# Patient Record
Sex: Female | Born: 1935
Health system: Southern US, Community
[De-identification: ages and names within clinical notes are randomized; demographics above are authoritative.]

## PROBLEM LIST (undated history)

## (undated) DIAGNOSIS — M1990 Unspecified osteoarthritis, unspecified site: Secondary | ICD-10-CM

## (undated) DIAGNOSIS — I10 Essential (primary) hypertension: Secondary | ICD-10-CM

## (undated) DIAGNOSIS — H269 Unspecified cataract: Secondary | ICD-10-CM

## (undated) DIAGNOSIS — R768 Other specified abnormal immunological findings in serum: Secondary | ICD-10-CM

## (undated) DIAGNOSIS — Z8719 Personal history of other diseases of the digestive system: Secondary | ICD-10-CM

## (undated) DIAGNOSIS — E119 Type 2 diabetes mellitus without complications: Secondary | ICD-10-CM

## (undated) DIAGNOSIS — Z9889 Other specified postprocedural states: Secondary | ICD-10-CM

## (undated) DIAGNOSIS — N200 Calculus of kidney: Secondary | ICD-10-CM

## (undated) DIAGNOSIS — F419 Anxiety disorder, unspecified: Secondary | ICD-10-CM

## (undated) DIAGNOSIS — K219 Gastro-esophageal reflux disease without esophagitis: Secondary | ICD-10-CM

## (undated) DIAGNOSIS — E7800 Pure hypercholesterolemia, unspecified: Secondary | ICD-10-CM

## (undated) DIAGNOSIS — E039 Hypothyroidism, unspecified: Secondary | ICD-10-CM

## (undated) DIAGNOSIS — R7689 Other specified abnormal immunological findings in serum: Secondary | ICD-10-CM

## (undated) DIAGNOSIS — R42 Dizziness and giddiness: Secondary | ICD-10-CM

## (undated) DIAGNOSIS — K760 Fatty (change of) liver, not elsewhere classified: Secondary | ICD-10-CM

## (undated) DIAGNOSIS — E669 Obesity, unspecified: Secondary | ICD-10-CM

## (undated) DIAGNOSIS — I499 Cardiac arrhythmia, unspecified: Secondary | ICD-10-CM

## (undated) DIAGNOSIS — R112 Nausea with vomiting, unspecified: Secondary | ICD-10-CM

## (undated) DIAGNOSIS — I639 Cerebral infarction, unspecified: Secondary | ICD-10-CM

## (undated) DIAGNOSIS — B1910 Unspecified viral hepatitis B without hepatic coma: Secondary | ICD-10-CM

## (undated) DIAGNOSIS — Z8639 Personal history of other endocrine, nutritional and metabolic disease: Secondary | ICD-10-CM

## (undated) HISTORY — PX: OTHER SURGICAL HISTORY: SHX169

## (undated) HISTORY — PX: BILATERAL CARPAL TUNNEL RELEASE: SHX6508

## (undated) HISTORY — DX: Pure hypercholesterolemia, unspecified: E78.00

## (undated) HISTORY — DX: Calculus of kidney: N20.0

## (undated) HISTORY — DX: Obesity, unspecified: E66.9

## (undated) HISTORY — DX: Dizziness and giddiness: R42

## (undated) HISTORY — DX: Gastro-esophageal reflux disease without esophagitis: K21.9

## (undated) HISTORY — DX: Anxiety disorder, unspecified: F41.9

## (undated) HISTORY — DX: Hypercalcemia: E83.52

## (undated) HISTORY — PX: ABDOMINAL HYSTERECTOMY: SHX81

## (undated) HISTORY — DX: Other specified abnormal immunological findings in serum: R76.89

## (undated) HISTORY — DX: Other specified abnormal immunological findings in serum: R76.8

## (undated) HISTORY — DX: Type 2 diabetes mellitus without complications: E11.9

## (undated) HISTORY — DX: Unspecified cataract: H26.9

## (undated) HISTORY — DX: Cerebral infarction, unspecified: I63.9

## (undated) HISTORY — DX: Essential (primary) hypertension: I10

## (undated) HISTORY — DX: Hypothyroidism, unspecified: E03.9

## (undated) HISTORY — PX: KNEE ARTHROSCOPY: SUR90

## (undated) HISTORY — DX: Fatty (change of) liver, not elsewhere classified: K76.0

## (undated) HISTORY — PX: EYE SURGERY: SHX253

## (undated) HISTORY — DX: Unspecified viral hepatitis B without hepatic coma: B19.10

## (undated) HISTORY — PX: CATARACT EXTRACTION, BILATERAL: SHX1313

---
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
## 1979-05-25 HISTORY — PX: APPENDECTOMY: SHX54

## 1979-05-25 HISTORY — PX: ABDOMINAL HYSTERECTOMY: SHX81

## 1998-11-20 ENCOUNTER — Encounter (INDEPENDENT_AMBULATORY_CARE_PROVIDER_SITE_OTHER): Payer: Self-pay | Admitting: Specialist

## 1998-11-20 ENCOUNTER — Other Ambulatory Visit: Admission: RE | Admit: 1998-11-20 | Discharge: 1998-11-20 | Payer: Self-pay | Admitting: Internal Medicine

## 2000-01-18 ENCOUNTER — Encounter: Admission: RE | Admit: 2000-01-18 | Discharge: 2000-02-04 | Payer: Self-pay | Admitting: Orthopaedic Surgery

## 2002-04-04 ENCOUNTER — Encounter: Admission: RE | Admit: 2002-04-04 | Discharge: 2002-06-06 | Payer: Self-pay | Admitting: Orthopaedic Surgery

## 2002-11-12 ENCOUNTER — Other Ambulatory Visit: Admission: RE | Admit: 2002-11-12 | Discharge: 2002-11-12 | Payer: Self-pay | Admitting: Family Medicine

## 2003-08-07 ENCOUNTER — Ambulatory Visit (HOSPITAL_COMMUNITY): Admission: RE | Admit: 2003-08-07 | Discharge: 2003-08-07 | Payer: Self-pay | Admitting: Internal Medicine

## 2005-01-22 ENCOUNTER — Ambulatory Visit (HOSPITAL_COMMUNITY): Admission: RE | Admit: 2005-01-22 | Discharge: 2005-01-22 | Payer: Self-pay | Admitting: Family Medicine

## 2006-04-27 ENCOUNTER — Ambulatory Visit: Payer: Self-pay | Admitting: Internal Medicine

## 2006-11-15 ENCOUNTER — Ambulatory Visit (HOSPITAL_COMMUNITY): Admission: RE | Admit: 2006-11-15 | Discharge: 2006-11-15 | Payer: Self-pay | Admitting: Family Medicine

## 2007-01-18 ENCOUNTER — Ambulatory Visit (HOSPITAL_COMMUNITY): Admission: RE | Admit: 2007-01-18 | Discharge: 2007-01-18 | Payer: Self-pay | Admitting: Internal Medicine

## 2007-03-01 ENCOUNTER — Encounter (HOSPITAL_COMMUNITY): Admission: RE | Admit: 2007-03-01 | Discharge: 2007-03-31 | Payer: Self-pay | Admitting: Oncology

## 2007-03-01 ENCOUNTER — Ambulatory Visit (HOSPITAL_COMMUNITY): Payer: Self-pay | Admitting: Oncology

## 2007-03-09 ENCOUNTER — Ambulatory Visit (HOSPITAL_COMMUNITY): Admission: RE | Admit: 2007-03-09 | Discharge: 2007-03-09 | Payer: Self-pay | Admitting: Oncology

## 2007-11-13 ENCOUNTER — Encounter (HOSPITAL_COMMUNITY): Admission: RE | Admit: 2007-11-13 | Discharge: 2007-12-13 | Payer: Self-pay | Admitting: Oncology

## 2007-11-15 ENCOUNTER — Ambulatory Visit (HOSPITAL_COMMUNITY): Payer: Self-pay | Admitting: Oncology

## 2008-05-07 ENCOUNTER — Ambulatory Visit (HOSPITAL_COMMUNITY): Payer: Self-pay | Admitting: Oncology

## 2008-10-22 ENCOUNTER — Encounter (HOSPITAL_COMMUNITY): Admission: RE | Admit: 2008-10-22 | Discharge: 2008-11-21 | Payer: Self-pay | Admitting: Oncology

## 2008-10-23 ENCOUNTER — Ambulatory Visit (HOSPITAL_COMMUNITY): Payer: Self-pay | Admitting: Oncology

## 2010-08-19 ENCOUNTER — Other Ambulatory Visit: Payer: Self-pay | Admitting: Dermatology

## 2010-10-05 ENCOUNTER — Ambulatory Visit (INDEPENDENT_AMBULATORY_CARE_PROVIDER_SITE_OTHER): Payer: MEDICARE | Admitting: Internal Medicine

## 2010-10-05 DIAGNOSIS — B181 Chronic viral hepatitis B without delta-agent: Secondary | ICD-10-CM

## 2010-10-09 NOTE — Op Note (Signed)
NAME:  Lisa Sutton, Lisa Sutton                       ACCOUNT NO.:  [id number]   MEDICAL RECORD NO.:  [id number]                   PATIENT TYPE:  AMB   LOCATION:  DAY                                  FACILITY:  [facility]   PHYSICIAN:  [name], M.D.                 DATE OF BIRTH:  Jan 29, 1934   DATE OF PROCEDURE:  08/07/2003  DATE OF DISCHARGE:                                 OPERATIVE REPORT   PROCEDURE:  Esophagogastroduodenoscopy with esophageal dilatation followed  by total colonoscopy.   ENDOSCOPIST:  [name], M.D.   INDICATIONS:  Lisa Sutton is a 77 year old Caucasian female with chronic  GERD who is now experiencing dysphagia, primarily to solids.  She had last  ED 15 to 20 years ago.  She is also undergoing surveillance colonoscopy.  Her last colonoscopy was about 5 years ago by Dr. Olevia Perches in [place].  She  had 2 polyps removed.   Procedure and risks were reviewed with the patient and informed consent was  obtained.   PREOPERATIVE MEDICATIONS:  Cetacaine spray for oropharyngeal topical  anesthesia, Demerol 50 mg IV and Versed 8 mg IV in divided dose.   FINDINGS:  Procedure performed in endoscopy suite.  The patient's vital  signs and O2 saturation were monitored during the procedure and remained  stable.   PROCEDURE #1: ESOPHAGOGASTRODUODENOSCOPY:  The patient was placed in the  left lateral recumbent position and Olympus videoscope was passed via the  oropharynx without any difficulty into the esophagus.   ESOPHAGUS:  Mucosa of the esophagus normal except distally where there was a  pseudodiverticular formation just above the GE junction secondary to  scarring.  There was a Schatzki ring.  There was a 5-6 mm ectopic gastric  mucosa at the proximal GE junction.  Pictures taken for the record.  There  was a 3-4 cm size sliding hiatal hernia.   STOMACH:  It was empty and distended very well with insufflation.  The folds  in the proximal stomach were normal.   Examination of the mucosa reveals  patchy erythema and granularity at body and antrum.  No erosions or ulcers  noted.  Pyloric channel was patent.  Angularis, fundus, and cardia were  examined by retroflexing the scope and were normal.   DUODENUM:  Examination of the bulb and postbulbar mucosa was normal.  Folds  in the second part of the duodenum were also normal.  Endoscope was  withdrawn.   The esophagus was dilated by passing 56 [place] Maloney dilator through the  esophagus completely. As the dilator was withdrawn the endoscope was passed  again and there was a small linear tear of the cervical esophagus indicating  either a tubular narrowing or a web.  There was also a disruption of the  Schatzki ring.  Pictures taken for the record and endoscope was withdrawn.  The patient prepared for procedure #2.   COLONOSCOPY:  Rectal examination was  performed.  No abnormality noted on  external or digital exam.   Olympus videoscope was placed in the rectum and advanced into the sigmoid  colon.  Multiple diverticula noted at sigmoid colon with a few more at  descending colon.  The sigmoid colon was tortuous and relatively fixed, but  slowly and carefully the scope was passed through this segment and into  descending colon, and further intubation to cecum was easy.  The cecum was  identified by appendiceal orifice and the ileocecal valve.  Pictures taken  for the record.  As the scope was withdrawn, colonic mucosa was carefully  examined and there was a single small polyp at hepatic flexure which was  ablated by cold biopsy.  There was focal, mucosal erythema at sigmoid colon,  but this was not peridiverticular.  This was felt to be nonspecific.  Pictures taken, but no biopsies were obtained.  Rectal mucosa was normal.   The scope was retroflexed to examine anorectal junction and moderate sized  hemorrhoids were noted below the dentate line. The endoscope was  straightened and withdrawn.   The patient tolerated the procedure well.   FINAL DIAGNOSES:  1. Distal esophageal ring with 4 cm size sliding hiatal hernia.  2. Pseudodiverticular formation at distal esophagus secondary to scarring     from previous esophagitis.  3. Esophagus with 56 French Maloney dilator disrupting this ring and also     across linear tear at cervical esophagus indicating a web or a focal     tubular narrowing.  4. Nonerosive gastritis.  5. Multiple diverticula at sigmoid colon with a few more at descending     colon.  6. Small polyp ablated by a cold biopsy from hepatic flexure.  7. Moderate sized external hemorrhoids.   RECOMMENDATIONS:  1. H. pylori serology will be obtained.  2. She will continue antireflux measures and should take Protonix 40 mg     q.a.m.  3. High fiber diet along with Citrucel 1 tablespoonful daily.  4. I will be contacting the patient with results of blood tests and biopsy.      ___________________________________________                                            [name], M.D.   NR/MEDQ  D:  08/07/2003  T:  08/07/2003  Job:  [id number]   cc:   [name], M.D.  [place]  [place] [address]  Fax: [phone]

## 2010-10-09 NOTE — Consult Note (Signed)
NAME:  Lisa Sutton, Lisa Sutton                         ACCOUNT NO.:  [id number]   MEDICAL RECORD NO.:  [id number]                  PATIENT TYPE:   LOCATION:                                       FACILITY:   PHYSICIAN:  [name], M.D.                 DATE OF BIRTH:  07-25-1933   DATE OF CONSULTATION:  07/18/2003  DATE OF DISCHARGE:                                   CONSULTATION   REASON FOR CONSULTATION:  Dysphagia.  The patient has chronic GERD.  History  of colonic polyps.  Time for a colonoscopy.   HISTORY OF PRESENT ILLNESS:  Lisa Sutton is a 77 year old Caucasian female  who is referred through the courtesy of Dr. Morrie Sheldon for GI evaluation.  She states she has had symptoms of reflux for over 20 years and she had an  ED 15-20 years ago by Dr. Lyla Son and was found to have a small sliding  hiatal hernia.  She has been on therapy and has done reasonably well.  Lately she has had some difficulty swallowing, particularly with raw apples.  About 8 or 10 weeks ago she had an episode where food would not get down,  she had regurgitation, and she finally was able to get relief.  She rarely  experiences heartburn.  She denies hoarseness, cough, or sore throat.  She  also denies nausea or vomiting, abdominal pain, melena, or rectal bleeding.  She is also interested in having a colonoscopy.  She was found to have a  couple of polyps on colonoscopy 5 years ago and also had one or two on a  colposcopy 10 years ago.  Her last colonoscopy was by Dr. Olevia Perches.  The  patient was told that her colon was very tortuous.  She has never had an  problems with constipation or diarrhea.  She has a very good appetite.  She  has gained 25 pounds in the last 5 years but lately her weight has been  stable.  She has had some difficulty walking since her left knee arthroscopy  but she does play golf regularly and gets some exercise walking that way.  She complains of cramps in her legs.  She was on Pravachol  which she  stopped.  She had had similar problem with other agents but she has never  been on Zetia.   She is on:  1. Levoxyl 112 mcg daily.  2. Lisinopril 10 mg daily.  3. Atenolol 25 mg daily.  4. Aspirin 81 mg daily.  5. Multivitamin daily.  6. Citracal daily.  7. Protonix 40 mg daily p.r.n.  8. Premarin 0.45 mg daily but she is taking it less often and her goal is to     just get off the medication.  9. Pravachol has been on hold.   PAST MEDICAL HISTORY:  1. History of GERD for 25 years.  2. Hypothyroidism was diagnosed 20 years ago.  3. She has been hypertensive for 4.  4. Hypercholesterolemia in which she is intolerant of HMG-CoA reductases.  5. She had hysterectomy 25 years ago.  6. She had left knee arthroscopy in November 2003.  7. She had a stress test in September 2004 which was within normal limits.   ALLERGIES:  None known.   FAMILY HISTORY:  Mother died in her sleep at age 29.  Physicians felt that  she either had MI or a pulmonary embolism.  Father died of complications  related to hip fracture at age 17.  She does not have any siblings.   SOCIAL HISTORY:  She is married.  She has one son.  She worked at [employer] for  almost 30 years but she has been retired for 5 or 6 years.  She smoked  cigarettes for 10 years, no more than half a pack per day but quit 10 years  ago.  She does not drink alcohol.   PHYSICAL EXAMINATION:  GENERAL:  Pleasant, mildly-obese Caucasian female who  is in no acute distress.  VITAL SIGNS:  She weighs 177.5 pounds, she is 5 feet 3 inches tall.  Pulse  62 per minute, blood pressure 140/90, temperature is 96.6.  HEENT:  Conjunctivae are pink, sclerae are nonicteric.  Oropharyngeal mucosa  is normal.  NECK:  Without masses or thyromegaly.  Carotids are 2+ bilaterally without  bruits.  CARDIAC:  Regular rhythm, normal S1 and S2.  No murmur or gallop noted.  LUNGS:  Clear to auscultation.  ABDOMEN:  Full.  Bowel sounds are normal.   Palpation reveals soft abdomen  without tenderness, organomegaly, or masses.  RECTAL:  Deferred.  EXTREMITIES:  No clubbing or peripheral edema noted.   Some of her records are available for review.  She had colonoscopy in June  2000.  She had small polyps snared from her cecum.  She also had left  colonic diverticulosis.   ASSESSMENT:  Lisa Sutton is a 77 year old Caucasian female with chronic  gastroesophageal reflux disease who is having intermittent dysphagia.  She  may have had a single episode of food impaction recently, relieved  spontaneously.  She has had some difficulty with raw apples but not with  many other foods.  She possibly has distal esophageal ring which should be  amenable to endoscopic therapy.   History of colonic polyps.  She has had polyps found on more than one  colonoscopy.  She is almost getting to a 5-year mark and therefore would be  appropriate for her to have a colonoscopy now with her EGD so she would not  have to make two trips.   RECOMMENDATIONS:  1. She will continue antireflux measures and Protonix as before.  2. EGD, possible ED, and a colonoscopy to be performed at [facility] in the near     future.   She was asked to hold off aspirin for 2 days.  I have reviewed the  procedures and risks with the patient.  She is agreeable.  Will use the  pediatric scope given that she has very tortuous colon.   As far as her hypercholesterolemia is concerned, she will follow up with Dr.  Morrie Sheldon.  She might be a candidate for Zetia since she has not been able  to tolerate HMG-CoA reductases.   I would like to thank Dr. Laurance Flatten for the opportunity to participate in the  care of this nice lady.      ___________________________________________  [name], M.D.   NR/MEDQ  D:  07/18/2003  T:  07/18/2003  Job:  [id number]   cc:   [name], M.D. [place]  [place] [address]  Fax: [facility]

## 2010-10-12 ENCOUNTER — Other Ambulatory Visit (HOSPITAL_COMMUNITY): Payer: Self-pay | Admitting: Oncology

## 2010-10-12 DIAGNOSIS — Z09 Encounter for follow-up examination after completed treatment for conditions other than malignant neoplasm: Secondary | ICD-10-CM

## 2010-12-09 ENCOUNTER — Encounter (HOSPITAL_COMMUNITY): Payer: Medicare Other | Attending: Oncology

## 2010-12-09 ENCOUNTER — Ambulatory Visit (HOSPITAL_COMMUNITY)
Admission: RE | Admit: 2010-12-09 | Discharge: 2010-12-09 | Disposition: A | Discharge status: Home / Self Care | Payer: Medicare Other | Source: Ambulatory Visit | Attending: Oncology | Admitting: Oncology

## 2010-12-09 DIAGNOSIS — Z09 Encounter for follow-up examination after completed treatment for conditions other than malignant neoplasm: Secondary | ICD-10-CM

## 2010-12-09 DIAGNOSIS — N200 Calculus of kidney: Secondary | ICD-10-CM | POA: Insufficient documentation

## 2010-12-09 DIAGNOSIS — Q619 Cystic kidney disease, unspecified: Secondary | ICD-10-CM | POA: Insufficient documentation

## 2010-12-09 DIAGNOSIS — R599 Enlarged lymph nodes, unspecified: Secondary | ICD-10-CM | POA: Insufficient documentation

## 2010-12-09 DIAGNOSIS — N281 Cyst of kidney, acquired: Secondary | ICD-10-CM | POA: Insufficient documentation

## 2010-12-09 DIAGNOSIS — K5730 Diverticulosis of large intestine without perforation or abscess without bleeding: Secondary | ICD-10-CM | POA: Insufficient documentation

## 2010-12-09 DIAGNOSIS — R591 Generalized enlarged lymph nodes: Secondary | ICD-10-CM

## 2010-12-09 LAB — COMPREHENSIVE METABOLIC PANEL
Albumin: 3.7 g/dL (ref 3.5–5.2)
BUN: 17 mg/dL (ref 6–23)
CO2: 30 mEq/L (ref 19–32)
Calcium: 10.2 mg/dL (ref 8.4–10.5)
GFR calc Af Amer: >60 mL/min (ref >60)
GFR calc non Af Amer: >60 mL/min (ref >60)
Glucose, Bld: 112 mg/dL — ABNORMAL HIGH (ref 70–99)
Potassium: 4.8 mEq/L (ref 3.5–5.1)
Total Bilirubin: 0.3 mg/dL (ref 0.3–1.2)

## 2010-12-09 LAB — CBC
Hemoglobin: 15.6 g/dL — ABNORMAL HIGH (ref 12.0–15.0)
MCH: 31 pg (ref 26.0–34.0)
MCHC: 33.5 g/dL (ref 30.0–36.0)
MCV: 92.5 fL (ref 78.0–100.0)
Platelets: 276 10*3/uL (ref 150–400)

## 2010-12-09 LAB — LACTATE DEHYDROGENASE: LDH: 153 U/L (ref 94–250)

## 2010-12-09 NOTE — Progress Notes (Signed)
Labs drawn today cbc , ldh,cmp

## 2010-12-14 ENCOUNTER — Encounter (HOSPITAL_COMMUNITY): Payer: Self-pay | Admitting: Oncology

## 2010-12-14 ENCOUNTER — Encounter (HOSPITAL_BASED_OUTPATIENT_CLINIC_OR_DEPARTMENT_OTHER): Payer: Medicare Other | Admitting: Oncology

## 2010-12-14 VITALS — BP 163/92 | HR 71 | Temp 97.9°F | Wt 180.0 lb

## 2010-12-14 DIAGNOSIS — R599 Enlarged lymph nodes, unspecified: Secondary | ICD-10-CM

## 2010-12-14 DIAGNOSIS — R591 Generalized enlarged lymph nodes: Secondary | ICD-10-CM

## 2010-12-14 NOTE — Progress Notes (Signed)
CC:   [name], M.D. [name], M.D.  DIAGNOSIS:  Mild lymphadenopathy in the retroperitoneum.  They are stable in size compared to June 2009, up slightly compared to June 2010 but by no means worse realistically.  She still has no "B" symptomatology and her labs the other day were excellent.  Lisa Sutton has no symptoms, still playing golf, fully active.  Her labs the other day showed a normal hemoglobin, normal hematocrit essentially, normal white count, platelet count and liver enzymes. Glucose was only 112.  Unfortunately her weight is up to 180 pounds, but she hovers around this area anyway.  PHYSICAL EXAMINATION:  No lymphadenopathy.  She is in no acute distress. She has no hepatosplenomegaly.  Breast exam is negative for masses. Lungs are clear.  Heart shows a regular rhythm and rate without murmur, rub or gallop.  She has no arm or leg edema.  She is alert and very oriented.  Her CT scan realistically is stable and she has been observed essentially since 2008 June.  We will see her once more time in 2 years. If she stable,  I do not see any need to really see her back for those lymph nodes, unless it shifts again, again a 2-year follow-up but will cross that bridge when we come to it.  She knows to come back sooner if she has any fevers, chills, night sweats, unexplained weight loss or just does not feel good.    ______________________________ Gaston Islam. Tressie Stalker, MD ESN/MEDQ  D:  12/14/2010  T:  12/14/2010  Job:  [id number]

## 2010-12-14 NOTE — Progress Notes (Signed)
This office note has been dictated.

## 2010-12-14 NOTE — Patient Instructions (Signed)
[facility]  Discharge Instructions  RECOMMENDATIONS MADE BY THE CONSULTANT AND ANY TEST RESULTS WILL BE SENT TO YOUR REFERRING DOCTOR.   EXAM FINDINGS BY MD TODAY AND SIGNS AND SYMPTOMS TO REPORT TO CLINIC OR PRIMARY MD: night sweats, fever, unexplained weight loss       SPECIAL INSTRUCTIONS/FOLLOW-UP: Return to Clinic on 2 years   I acknowledge that I have been informed and understand all the instructions given to me and received a copy. I do not have any more questions at this time, but understand that I may call the [facility] at [facility] at [phone] during business hours should I have any further questions or need assistance in obtaining follow-up care.    __________________________________________  _____________  __________ Signature of Patient or Authorized Representative            Date                   Time    __________________________________________ Nurse's Signature

## 2011-03-04 LAB — DIFFERENTIAL
Basophils Absolute: 0.1
Monocytes Absolute: 1.2 — ABNORMAL HIGH

## 2011-03-04 LAB — CBC
HCT: 42.9
Hemoglobin: 14.6
MCHC: 33.9
Platelets: 401 — ABNORMAL HIGH
WBC: 10.5

## 2011-03-04 LAB — SEDIMENTATION RATE: Sed Rate: 15

## 2011-03-04 LAB — BETA 2 MICROGLOBULIN, SERUM: Beta-2 Microglobulin: 2.72 — ABNORMAL HIGH

## 2011-04-21 ENCOUNTER — Telehealth (INDEPENDENT_AMBULATORY_CARE_PROVIDER_SITE_OTHER): Payer: Self-pay | Admitting: *Deleted

## 2011-04-21 NOTE — Telephone Encounter (Signed)
LM, wanting Tammy to return his call to [phone]. He is needing a new rx for something.

## 2011-04-28 NOTE — Telephone Encounter (Signed)
I called Lisa Sutton and left a message for her to call me with the name of prescription,ect that she may need and I would address with Dr. Laural Golden on 05-03-11.. I also ask that if this was a local pharmacy to have them fax the request to [place] and supplied the fax number.

## 2011-08-09 ENCOUNTER — Encounter (INDEPENDENT_AMBULATORY_CARE_PROVIDER_SITE_OTHER): Payer: Self-pay | Admitting: Internal Medicine

## 2011-08-09 ENCOUNTER — Ambulatory Visit (INDEPENDENT_AMBULATORY_CARE_PROVIDER_SITE_OTHER): Payer: Medicare Other | Admitting: Internal Medicine

## 2011-08-09 ENCOUNTER — Other Ambulatory Visit (INDEPENDENT_AMBULATORY_CARE_PROVIDER_SITE_OTHER): Payer: Self-pay | Admitting: *Deleted

## 2011-08-09 DIAGNOSIS — B1910 Unspecified viral hepatitis B without hepatic coma: Secondary | ICD-10-CM | POA: Insufficient documentation

## 2011-08-09 DIAGNOSIS — R1310 Dysphagia, unspecified: Secondary | ICD-10-CM

## 2011-08-09 DIAGNOSIS — E1121 Type 2 diabetes mellitus with diabetic nephropathy: Secondary | ICD-10-CM | POA: Insufficient documentation

## 2011-08-09 DIAGNOSIS — R1319 Other dysphagia: Secondary | ICD-10-CM | POA: Insufficient documentation

## 2011-08-09 DIAGNOSIS — K760 Fatty (change of) liver, not elsewhere classified: Secondary | ICD-10-CM | POA: Insufficient documentation

## 2011-08-09 MED ORDER — OMEPRAZOLE 20 MG PO CPDR: 20.0000 mg | DELAYED_RELEASE_CAPSULE | Freq: Two times a day (BID) | ORAL | Status: DC

## 2011-08-09 NOTE — Patient Instructions (Signed)
Will schedule and EGD/ED with Dr. Laural Golden.

## 2011-08-09 NOTE — Progress Notes (Signed)
Subjective:     Patient ID: Lisa Sutton, female   DOB: 05/17/1934, 78 y.o.   MRN: [id number]  HPI  Eight weeks ago she was put on Metformin. Since starting the Metformin  She started having dysphagia to solids. She will have to vomit the bolus it up.  It is occuring about 2-3 weeks.  She also c/o of acid reflux.  Spicy foods will cause acid reflux. Appetite is good. No weight loss. No abdominal pain. BM x 1 a day.  Her last EGD/ED in 02/2009 for dysphagia: Erosive reflux esophagitis with ring at the GE junction which was dilated/disrupted by passing a [place] dilator. Erosive antral gastritis with 3 mm ulcer. Hx of fatty liver and chronic Hepatitis B infection with undetectable HBV DNA levels.  12/09/2010 AST 28, ALT 31, ALP 97.  I will try to obtain her most recent CMET from Dr. Laurance Flatten Review of Systems see hpi     Current Outpatient Prescriptions  Medication Sig Dispense Refill  . aspirin 81 MG EC tablet Take 81 mg by mouth daily.        Marland Kitchen atenolol (TENORMIN) 25 MG tablet Take 25 mg by mouth daily.        . calcium citrate-vitamin D 200-200 MG-UNIT TABS Take 1 tablet by mouth daily.        . Cholecalciferol (VITAMIN D) 1000 UNITS capsule Take 1,000 Units by mouth daily.        . fish oil-omega-3 fatty acids 1000 MG capsule Take 1,000 mg by mouth daily.        Marland Kitchen levothyroxine (SYNTHROID, LEVOTHROID) 112 MCG tablet Take 112 mcg by mouth daily. 169mcg daily and 79mcg on M-W-F      . lisinopril (PRINIVIL,ZESTRIL) 20 MG tablet Take 20 mg by mouth daily.        . metFORMIN (GLUCOPHAGE) 500 MG tablet Take 500 mg by mouth 2 (two) times daily with a meal.      . Multiple Vitamin (MULTIVITAMIN) tablet Take 1 tablet by mouth daily.        Marland Kitchen omeprazole (PRILOSEC) 20 MG capsule Take 1 capsule (20 mg total) by mouth 2 (two) times daily.  60 capsule  1   Past Medical History  Diagnosis Date  . Diabetes mellitus   . Hypertension   . GERD (gastroesophageal reflux disease)   .  Osteoporosis   . Obesity   . Hypothyroidism   . Dysphagia   . Hepatitis B   . Fatty liver    Past Surgical History  Procedure Date  . Abdominal hysterectomy   . Deviated septum repair   . Knee arthroscopy     left knee 2003   No family status information on file.   . Objective:   Physical Exam Filed Vitals:   08/09/11 1144  Height: 5\' 3"  (1.6 m)  Weight: 179 lb 9.6 oz (81.466 kg)    Alert and oriented. Skin warm and dry. Oral mucosa is moist.   . Sclera anicteric, conjunctivae is pink. Thyroid not enlarged. No cervical lymphadenopathy. Lungs clear. Heart regular rate and rhythm.  Abdomen is soft. Bowel sounds are positive. No hepatomegaly. No abdominal masses felt. No tenderness.  No edema to lower extremities. Patient is alert and oriented.      Assessment:    Dysphagia to solids. Esophageal stricture/ring needs to be ruled out. PUD also needs to be ruled given pat hx.     Plan:    EGD/ED in the near future  with Dr. Selena Lesser 20mg  po daily. Further recommendations to follow once she has has the EGD/ED

## 2011-08-09 NOTE — Progress Notes (Signed)
Addended by: Butch Penny on: 08/09/2011 03:17 PM   Modules accepted: Orders

## 2011-08-10 ENCOUNTER — Encounter (HOSPITAL_COMMUNITY): Payer: Self-pay | Admitting: Pharmacy Technician

## 2011-08-17 MED ORDER — SODIUM CHLORIDE 0.45 % IV SOLN
Freq: Once | INTRAVENOUS | Status: AC
  Administered 2011-08-18: 1000 mL via INTRAVENOUS

## 2011-08-18 ENCOUNTER — Encounter (HOSPITAL_COMMUNITY): Payer: Self-pay | Admitting: *Deleted

## 2011-08-18 ENCOUNTER — Ambulatory Visit (HOSPITAL_COMMUNITY)
Admission: RE | Admit: 2011-08-18 | Discharge: 2011-08-18 | Disposition: A | Discharge status: Home / Self Care | Payer: Medicare Other | Source: Ambulatory Visit | Attending: Internal Medicine | Admitting: Internal Medicine

## 2011-08-18 ENCOUNTER — Encounter (HOSPITAL_COMMUNITY)
Admission: RE | Disposition: A | Discharge status: Home / Self Care | Payer: Self-pay | Source: Ambulatory Visit | Attending: Internal Medicine

## 2011-08-18 DIAGNOSIS — R1310 Dysphagia, unspecified: Secondary | ICD-10-CM | POA: Insufficient documentation

## 2011-08-18 DIAGNOSIS — K222 Esophageal obstruction: Secondary | ICD-10-CM

## 2011-08-18 DIAGNOSIS — K449 Diaphragmatic hernia without obstruction or gangrene: Secondary | ICD-10-CM | POA: Insufficient documentation

## 2011-08-18 DIAGNOSIS — K219 Gastro-esophageal reflux disease without esophagitis: Secondary | ICD-10-CM

## 2011-08-18 DIAGNOSIS — Z79899 Other long term (current) drug therapy: Secondary | ICD-10-CM | POA: Insufficient documentation

## 2011-08-18 DIAGNOSIS — E119 Type 2 diabetes mellitus without complications: Secondary | ICD-10-CM | POA: Insufficient documentation

## 2011-08-18 DIAGNOSIS — I10 Essential (primary) hypertension: Secondary | ICD-10-CM | POA: Insufficient documentation

## 2011-08-18 DIAGNOSIS — Z7982 Long term (current) use of aspirin: Secondary | ICD-10-CM | POA: Insufficient documentation

## 2011-08-18 SURGERY — ESOPHAGOGASTRODUODENOSCOPY (EGD) WITH ESOPHAGEAL DILATION
Anesthesia: Moderate Sedation

## 2011-08-18 MED ORDER — MIDAZOLAM HCL 5 MG/5ML IJ SOLN
INTRAMUSCULAR | Status: DC | PRN
  Administered 2011-08-18: 1 mg via INTRAVENOUS
  Administered 2011-08-18 (×2): 2 mg via INTRAVENOUS
  Administered 2011-08-18: 1 mg via INTRAVENOUS

## 2011-08-18 MED ORDER — MEPERIDINE HCL 25 MG/ML IJ SOLN
INTRAMUSCULAR | Status: DC | PRN
  Administered 2011-08-18 (×2): 25 mg via INTRAVENOUS

## 2011-08-18 MED ORDER — MIDAZOLAM HCL 5 MG/5ML IJ SOLN
INTRAMUSCULAR | Status: AC
  Filled 2011-08-18: qty 10

## 2011-08-18 MED ORDER — MEPERIDINE HCL 50 MG/ML IJ SOLN
INTRAMUSCULAR | Status: AC
  Filled 2011-08-18: qty 1

## 2011-08-18 MED ORDER — BUTAMBEN-TETRACAINE-BENZOCAINE 2-2-14 % EX AERO
INHALATION_SPRAY | CUTANEOUS | Status: DC | PRN
  Administered 2011-08-18: 1 via TOPICAL

## 2011-08-18 NOTE — Op Note (Addendum)
EGD PROCEDURE REPORT  PATIENT:  Lisa Sutton  MR#:  [id number] Birthdate:  April 02, 1934, 78 y.o., female Endoscopist:  Dr. Rogene Houston, MD Referred By:  Dr. [name], MD Procedure Date: 08/18/2011  Procedure:   EGD with ED.  Indications:  Patient is a 78 year old Caucasian female who presents with solid food dysphagia. She has chronic GERD and her symptoms of uncontrolled therapy. She has undergone esophageal dilation in 2005 and more recently in 2010.            Informed Consent:  The risks, benefits, alternatives & imponderables which include, but are not limited to, bleeding, infection, perforation, drug reaction and potential missed lesion have been reviewed.  The potential for biopsy, lesion removal, esophageal dilation, etc. have also been discussed.  Questions have been answered.  All parties agreeable.  Please see history & physical in medical record for more information.  Medications:  Demerol 50 mg IV Versed 6 mg IV Cetacaine spray topically for oropharyngeal anesthesia  Description of procedure:  The endoscope was introduced through the mouth and advanced to the second portion of the duodenum without difficulty or limitations. The mucosal surfaces were surveyed very carefully during advancement of the scope and upon withdrawal.  Findings:  Esophagus:  Mucosa of the proximal and middle third was normal. Some scarring in the distal esophagus and a soft stricture at GE junction. GEJ:  33 cm Hiatus:  35 cm Stomach:  Stomach was empty and distended very well with insufflation. Fold in the proximal stomach are normal. Examination mucosa and body, antrum pyloric channel as well as angularis fundus and cardia was normal. Duodenum:  Normal bulbar and post bulbar mucosa.  Therapeutic/Diagnostic Maneuvers Performed:  Esophagus dilated by passing 11 French Maloney dilator from insertion. Esophageal mucosa was reexamined post dilation there was linear mucosal disruption just  below the upper esophageal sphincter as well as at GE junction. Patient tolerated the procedure well.  Complications:  None  Impression: Soft stricture at GE junction. Small sliding hiatal hernia. Esophageal dilation performed by passing 56 French Maloney dilator disrupting the mucosa at GE junction as well as below UES indicative of esophageal web.  Recommendations:  Standard instructions given. Patient to call us with progress report in one week  Fleta Borgeson U  08/18/2011  12:58 PM  CC: Dr. Redge Gainer, MD, MD & Dr. Rayne Du ref. provider found

## 2011-08-18 NOTE — Discharge Instructions (Signed)
Resume aspirin on 08/19/2011. Soft diet for 24 hours. No driving for [id number]. Please call the office with progress report in one week.Moderate Sedation, Adult Moderate sedation is given to help you relax or even sleep through a procedure. You may remain sleepy, be clumsy, or have poor balance for several hours following this procedure. Arrange for a responsible adult, family member, or friend to take you home. A responsible adult should stay with you for at least 24 hours or until the medicines have worn off.  Do not participate in any activities where you could become injured for the next 24 hours, or until you feel normal again. Do not:   Drive.   Swim.   Ride a bicycle.   Operate heavy machinery.   Cook.   Use power tools.   Climb ladders.   Work at [employer].   Do not make important decisions or sign legal documents until you are improved.   Vomiting may occur if you eat too soon. When you can drink without vomiting, try water, juice, or soup. Try solid foods if you feel little or no nausea.   Only take over-the-counter or prescription medications for pain, discomfort, or fever as directed by your caregiver.If pain medications have been prescribed for you, ask your caregiver how soon it is safe to take them.   Make sure you and your family fully understands everything about the medication given to you. Make sure you understand what side effects may occur.   You should not drink alcohol, take sleeping pills, or medications that cause drowsiness for at least 24 hours.   If you smoke, do not smoke alone.   If you are feeling better, you may resume normal activities 24 hours after receiving sedation.   Keep all appointments as scheduled. Follow all instructions.   Ask questions if you do not understand.  SEEK MEDICAL CARE IF:   Your skin is pale or bluish in color.   You continue to feel sick to your stomach (nauseous) or throw up (vomit).   Your pain is getting worse  and not helped by medication.   You have bleeding or swelling.   You are still sleepy or feeling clumsy after 24 hours.  SEEK IMMEDIATE MEDICAL CARE IF:   You develop a rash.   You have difficulty breathing.   You develop any type of allergic problem.   You have a fever.  Document Released: 02/02/2001 Document Revised: 04/29/2011 Document Reviewed: 06/26/2007 [facility] Patient Information 2012 [place].Esophagogastroduodenoscopy This is an endoscopic procedure (a procedure that uses a device like a flexible telescope) that allows your caregiver to view the upper stomach and small bowel. This test allows your caregiver to look at the esophagus. The esophagus carries food from your mouth to your stomach. They can also look at your duodenum. This is the first part of the small intestine that attaches to the stomach. This test is used to detect problems in the bowel such as ulcers and inflammation. PREPARATION FOR TEST Nothing to eat after midnight the day before the test. NORMAL FINDINGS Normal esophagus, stomach, and duodenum. Ranges for normal findings may vary among different laboratories and hospitals. You should always check with your doctor after having lab work or other tests done to discuss the meaning of your test results and whether your values are considered within normal limits. MEANING OF TEST  Your caregiver will go over the test results with you and discuss the importance and meaning of your results, as well  as treatment options and the need for additional tests if necessary. OBTAINING THE TEST RESULTS It is your responsibility to obtain your test results. Ask the lab or department performing the test when and how you will get your results. Document Released: 09/10/2004 Document Revised: 04/29/2011 Document Reviewed: 04/19/2008 [facility] Patient Information 2012 [place].

## 2011-08-18 NOTE — H&P (Signed)
This is an update to history and physical from 08/09/2011. No change in vision symptoms or medications. She is undergoing EGD?ED for solid food dysphagia.

## 2011-08-26 ENCOUNTER — Telehealth (INDEPENDENT_AMBULATORY_CARE_PROVIDER_SITE_OTHER): Payer: Self-pay | Admitting: *Deleted

## 2011-08-26 NOTE — Telephone Encounter (Signed)
Lisa Sutton called to see what her results were following her EGD/ED. As I looked in her chart ,the patient was to have called [place] with a progress report in one week. She states that things are good ,at first she was a little sore in her throat but now things are good. She ask when should she be seen again and wants to be sen by Dr. Laural Golden.  Forwarded to Dr. Laural Golden for review. Patient is on vacation and may be reached at [phone].

## 2011-09-03 NOTE — Telephone Encounter (Signed)
Talked with patient. Her swallowing is much better. Office visit in 6 months.

## 2011-09-06 NOTE — Telephone Encounter (Signed)
6 month f/u has been put on the patients recall list

## 2012-03-01 ENCOUNTER — Encounter (INDEPENDENT_AMBULATORY_CARE_PROVIDER_SITE_OTHER): Payer: Self-pay | Admitting: *Deleted

## 2012-03-14 ENCOUNTER — Ambulatory Visit (INDEPENDENT_AMBULATORY_CARE_PROVIDER_SITE_OTHER): Payer: Medicare Other | Admitting: Internal Medicine

## 2012-04-25 ENCOUNTER — Ambulatory Visit (INDEPENDENT_AMBULATORY_CARE_PROVIDER_SITE_OTHER): Payer: Medicare Other | Admitting: Internal Medicine

## 2012-04-25 ENCOUNTER — Encounter (INDEPENDENT_AMBULATORY_CARE_PROVIDER_SITE_OTHER): Payer: Self-pay | Admitting: Internal Medicine

## 2012-04-25 VITALS — BP 118/80 | HR 78 | Temp 97.3°F | Resp 18 | Ht 63.0 in | Wt 178.9 lb

## 2012-04-25 DIAGNOSIS — K219 Gastro-esophageal reflux disease without esophagitis: Secondary | ICD-10-CM

## 2012-04-25 DIAGNOSIS — B181 Chronic viral hepatitis B without delta-agent: Secondary | ICD-10-CM

## 2012-04-25 MED ORDER — OMEPRAZOLE 20 MG PO CPDR: 20.0000 mg | DELAYED_RELEASE_CAPSULE | ORAL | Status: DC

## 2012-04-25 NOTE — Progress Notes (Signed)
Presenting complaint;  Follow for GERD and dysphagia.  Subjective:  Patient is 78 year old Caucasian female who has chronic GERD and presented with dysphagia in March 2013 and underwent EGD with ED. She has small sliding hiatal hernia and soft stricture at GE junction but she also had esophageal web which was evident after passing 78 [place] dilator. She is not taking her PPI regularly. She has no difficulty swallowing whatsoever. She has intermittent heartburn with certain foods. Her appetite remains good. She is seeing Dr. Dorris Fetch regarding her diabetes mellitus. She denies abdominal pain or LE edema.  Current Medications: Current Outpatient Prescriptions  Medication Sig Dispense Refill  . ADVANCED FIBER COMPLEX PO Take by mouth daily. These are gummies      . atenolol (TENORMIN) 25 MG tablet Take 50 mg by mouth daily.       . Cholecalciferol (VITAMIN D) 1000 UNITS capsule Take 1,000 Units by mouth daily.        . CRESTOR 5 MG tablet Take 5 mg by mouth daily.       . fish oil-omega-3 fatty acids 1000 MG capsule Take 1,000 mg by mouth daily.        Marland Kitchen ibuprofen (ADVIL,MOTRIN) 200 MG tablet Take 200 mg by mouth every 6 (six) hours as needed. For pain      . levothyroxine (SYNTHROID, LEVOTHROID) 112 MCG tablet Take 112 mcg by mouth daily.       Marland Kitchen lisinopril (PRINIVIL,ZESTRIL) 20 MG tablet Take 30 mg by mouth daily.       . metFORMIN (GLUCOPHAGE) 500 MG tablet Take 500 mg by mouth 2 (two) times daily with a meal.      . Multiple Vitamin (MULITIVITAMIN WITH MINERALS) TABS Take 1 tablet by mouth daily.      Marland Kitchen omeprazole (PRILOSEC) 20 MG capsule Take 1 capsule (20 mg total) by mouth 2 (two) times daily.  60 capsule  1  . vitamin B-12 (CYANOCOBALAMIN) 500 MCG tablet Take 500 mcg by mouth daily.      . [DISCONTINUED] calcium citrate-vitamin D 200-200 MG-UNIT TABS Take 1 tablet by mouth daily.           Objective: Blood pressure 118/80, pulse 78, temperature 97.3 F (36.3 C), temperature  source Oral, resp. rate 18, height 5\' 3"  (1.6 m), weight 178 lb 14.4 oz (81.149 kg). Patient is alert and in no acute distress Conjunctiva is pink. Sclera is nonicteric Oropharyngeal mucosa is normal. No neck masses or thyromegaly noted. Cardiac exam with regular rhythm normal S1 and S2. No murmur or gallop noted. Lungs are clear to auscultation. Abdomen is full but soft and nontender without hepatosplenomegaly. No LE edema or clubbing noted.  Labs/studies Results:  Assessment:  #1. GERD. Symptoms well controlled with therapy. EGD in March 2013 revealed soft stricture at GE junction which was dilated with complete resolution of dysphagia. She should take omeprazole daily or at least 3 times a week rather than on when necessary basis. #2. She is an active HBsAg carrier. Her HBV DNA levels have been undetectable. She has no stigmata of chronic liver disease therefore there is no indication for therapy.   Plan: Patient advised to take omeprazole 20 mg by mouth daily or at least 3 times a week. She'll have hepatitis B surface antigen with her next blood draw. Office visit in one year.

## 2012-04-25 NOTE — Patient Instructions (Signed)
Physician will contact you with results of blood work when completed. 

## 2012-07-08 ENCOUNTER — Other Ambulatory Visit: Payer: Self-pay

## 2012-08-16 ENCOUNTER — Encounter (INDEPENDENT_AMBULATORY_CARE_PROVIDER_SITE_OTHER): Payer: Self-pay

## 2012-08-21 ENCOUNTER — Telehealth: Payer: Self-pay | Admitting: Family Medicine

## 2012-08-22 NOTE — Telephone Encounter (Signed)
Patient called today because she had not received a call back yesterday. The patients message had never been routed to the clinical team. I advised the patient that they should be getting back to her today.

## 2012-08-22 NOTE — Telephone Encounter (Signed)
LMOM Called home phone and cell phone

## 2012-08-23 ENCOUNTER — Telehealth: Payer: Self-pay | Admitting: Family Medicine

## 2012-08-23 DIAGNOSIS — I10 Essential (primary) hypertension: Secondary | ICD-10-CM

## 2012-08-23 NOTE — Telephone Encounter (Signed)
Pt wants to see Dr Domenic Polite Cardiologist due to hypertension

## 2012-08-23 NOTE — Telephone Encounter (Signed)
Left message to increase Lisinopril to BID and we will schedule her an appt with Dr Domenic Polite

## 2012-08-23 NOTE — Telephone Encounter (Signed)
Increase lisinopril to 20 mg twice daily

## 2012-08-28 ENCOUNTER — Telehealth (INDEPENDENT_AMBULATORY_CARE_PROVIDER_SITE_OTHER): Payer: Self-pay | Admitting: *Deleted

## 2012-08-28 NOTE — Telephone Encounter (Signed)
Advised to take PPI on a daily basis and she how she does

## 2012-08-28 NOTE — Telephone Encounter (Signed)
LM asking Lisa Sutton PLEASE give her a return call at [phone] or [phone]. She has acid reflux so bad on Friday night that it went into her lungs. Is there anything Dr. Laural Golden call call in or let her know what she can take OTC.

## 2012-08-28 NOTE — Telephone Encounter (Signed)
Forwarded to Terri 

## 2012-08-28 NOTE — Telephone Encounter (Signed)
Lisa Sutton patient is currently taking Omeprazole 20 mg on Monday , Wednesday and Friday Please advise what else she can take or do? Last seen in our office on 04/2012

## 2012-09-13 ENCOUNTER — Telehealth: Payer: Self-pay | Admitting: Family Medicine

## 2012-09-13 NOTE — Telephone Encounter (Signed)
OFFERED APPT WITH MMM AT 1215 TOMORROW 4/24- JUST DIDN'T WANT THAT ONE- SO SHE WILL THINK ABOUT IT AND CALL us BACK IF DECIDES TO COME.

## 2012-10-10 ENCOUNTER — Ambulatory Visit: Payer: Medicare Other | Admitting: Cardiology

## 2012-10-13 ENCOUNTER — Encounter: Payer: Self-pay | Admitting: Cardiology

## 2012-10-13 ENCOUNTER — Ambulatory Visit (INDEPENDENT_AMBULATORY_CARE_PROVIDER_SITE_OTHER): Payer: Medicare Other | Admitting: Cardiology

## 2012-10-13 VITALS — BP 160/102 | HR 62 | Ht 63.0 in | Wt 178.5 lb

## 2012-10-13 DIAGNOSIS — I10 Essential (primary) hypertension: Secondary | ICD-10-CM

## 2012-10-13 DIAGNOSIS — E119 Type 2 diabetes mellitus without complications: Secondary | ICD-10-CM

## 2012-10-13 DIAGNOSIS — Z79899 Other long term (current) drug therapy: Secondary | ICD-10-CM

## 2012-10-13 MED ORDER — CARVEDILOL 6.25 MG PO TABS: 6.2500 mg | ORAL_TABLET | Freq: Two times a day (BID) | ORAL | Status: DC

## 2012-10-13 MED ORDER — LISINOPRIL-HYDROCHLOROTHIAZIDE 20-12.5 MG PO TABS: 1.0000 | ORAL_TABLET | Freq: Two times a day (BID) | ORAL | Status: DC

## 2012-10-13 NOTE — Patient Instructions (Signed)
   Stop Atenolol  Change to Coreg 6.25mg  twice a day    Stop Lisinopril  Change to Lisinopril / HCTZ 20/12.5mg  twice a day   Continue all other current medications. Lab for BMET just prior to next visit Follow up in  6 weeks

## 2012-10-13 NOTE — Progress Notes (Signed)
Clinical Summary Lisa Sutton is a 79 y.o.female referred for cardiology consultation by Dr. Laurance Flatten. She reports history of hypertension for at least the last 5 years, documented an increase in her blood pressure by home checks over the last several months. She has been on atenolol and lisinopril long-term, both doses increased, most recently lisinopril to 20 mg twice daily. She does state that her blood pressures have come down somewhat, however systolics tend to be in the 0000000, diastolics in the [id number] to 0000000. Blood pressure is higher today.  She states that she tries to stay active, plays golf regularly, also exercises at the gym. She has no angina symptoms or progressive shortness of breath, and her ECG is normal today.  Lab work from January reviewed finding cholesterol 110, triglycerides 202, HDL 50, LDL 120, AST 24, ALT 24. Previous lab work from December 2013 showed BUN 15, creatinine 0.8, potassium 5.2, sodium 141.  No Known Allergies  Current Outpatient Prescriptions  Medication Sig Dispense Refill  . ADVANCED FIBER COMPLEX PO Take by mouth daily. These are gummies      . Cholecalciferol (VITAMIN D) 1000 UNITS capsule Take 1,000 Units by mouth daily.        . CRESTOR 5 MG tablet Take 5 mg by mouth daily.       . fish oil-omega-3 fatty acids 1000 MG capsule Take 1,000 mg by mouth daily.        Marland Kitchen ibuprofen (ADVIL,MOTRIN) 200 MG tablet Take 200 mg by mouth every 6 (six) hours as needed. For pain      . levothyroxine (SYNTHROID, LEVOTHROID) 112 MCG tablet Take 112 mcg by mouth daily.       . metFORMIN (GLUCOPHAGE) 500 MG tablet Take 500 mg by mouth 2 (two) times daily with a meal.      . Multiple Vitamin (MULITIVITAMIN WITH MINERALS) TABS Take 1 tablet by mouth daily.      Marland Kitchen omeprazole (PRILOSEC) 20 MG capsule Take 20 mg by mouth daily.      . carvedilol (COREG) 6.25 MG tablet Take 1 tablet (6.25 mg total) by mouth 2 (two) times daily.  60 tablet  6  . lisinopril-hydrochlorothiazide  (PRINZIDE,ZESTORETIC) 20-12.5 MG per tablet Take 1 tablet by mouth 2 (two) times daily.  60 tablet  6  . vitamin B-12 (CYANOCOBALAMIN) 500 MCG tablet Take 500 mcg by mouth daily.      . [DISCONTINUED] calcium citrate-vitamin D 200-200 MG-UNIT TABS Take 1 tablet by mouth daily.         No current facility-administered medications for this visit.    Past Medical History  Diagnosis Date  . Type 2 diabetes mellitus   . Essential hypertension, benign   . GERD (gastroesophageal reflux disease)   . Osteoporosis   . Obesity   . Hypothyroidism   . Dysphagia   . Hepatitis B   . Fatty liver     Past Surgical History  Procedure Laterality Date  . Abdominal hysterectomy    . Deviated septum repair    . Knee arthroscopy      left knee 2003  . Cataract extraction, bilateral      Family History  Problem Relation Age of Onset  . Hypertension Father   . Cancer Maternal Aunt     Social History Lisa Sutton reports that she has never smoked. She has never used smokeless tobacco. Lisa Sutton reports that she does not drink alcohol.  Review of Systems No palpitations. Some decreased energy level  noted. Has difficulty losing weight. No leg edema, orthopnea, PND. No bleeding problems. Stable appetite. Otherwise negative.  Physical Examination Filed Vitals:   10/13/12 0914  BP: 160/102  Pulse: 62   Filed Weights   10/13/12 0914  Weight: 178 lb 8 oz (80.967 kg)   Comfortable. HEENT: Conjunctiva and lids normal, oropharynx clear. Neck: Supple, no elevated JVP or carotid bruits, no thyromegaly. Lungs: Clear to auscultation, nonlabored breathing at rest. Cardiac: Regular rate and rhythm, no S3 or significant systolic murmur, no pericardial rub. Abdomen: Soft, nontender, bowel sounds present. No bruit. Extremities: No pitting edema, distal pulses 2+. Skin: Warm and dry. Musculoskeletal: No kyphosis. Neuropsychiatric: Alert and oriented x3, affect grossly appropriate.   Problem List  and Plan   Essential hypertension, benign We discussed sodium restriction, also continuing her regular exercise plan. We reviewed her medications. Lisinopril will be changed to lisinopril HCTZ 20/12.5 mg twice daily with followup BMET in 6 weeks to reassess potassium. She will continue to check her blood pressure at home. She was also interested in trying a different beta blocker and we will switch from Tenormin to Coreg 6.25 mg twice daily, uptitrating from there. She may have better blood pressure control with the additional alpha blocker affect. Office followup arranged.  Type 2 diabetes mellitus Keep followup with Dr. Laurance Flatten.    Satira Sark, M.D., F.A.C.C.

## 2012-10-13 NOTE — Assessment & Plan Note (Signed)
We discussed sodium restriction, also continuing her regular exercise plan. We reviewed her medications. Lisinopril will be changed to lisinopril HCTZ 20/12.5 mg twice daily with followup BMET in 6 weeks to reassess potassium. She will continue to check her blood pressure at home. She was also interested in trying a different beta blocker and we will switch from Tenormin to Coreg 6.25 mg twice daily, uptitrating from there. She may have better blood pressure control with the additional alpha blocker affect. Office followup arranged.

## 2012-10-13 NOTE — Assessment & Plan Note (Signed)
Keep followup with Dr. Laurance Flatten.

## 2012-10-17 ENCOUNTER — Telehealth: Payer: Self-pay | Admitting: *Deleted

## 2012-10-17 ENCOUNTER — Other Ambulatory Visit: Payer: Self-pay | Admitting: *Deleted

## 2012-10-17 NOTE — Addendum Note (Signed)
Addended by: Merlene Laughter on: 10/17/2012 11:13 AM   Modules accepted: Orders

## 2012-10-17 NOTE — Telephone Encounter (Signed)
Patient called because she has c/o of feeling nausea, weak and a little dizziness when she stands up but it goes away since her recent BP med changes. Patient said her heart rate is staying elevated at 119. BP is now running 99/70 and 98/87. No c/o chest pain or sob. Nurse advised patient that some of her symptoms could be coming from the recent med changes but MD would be informed. Please advise.

## 2012-10-17 NOTE — Telephone Encounter (Signed)
If her blood pressure has come down that much, have her take the lisinopril HCTZ only once daily.

## 2012-10-17 NOTE — Telephone Encounter (Signed)
Patient informed and verbalized understanding of plan. 

## 2012-10-19 ENCOUNTER — Telehealth: Payer: Self-pay | Admitting: *Deleted

## 2012-10-19 NOTE — Telephone Encounter (Signed)
It may be that she has not yet had Coreg uptitrated to an optimal dose. She could either consider going up on Coreg to 12.5 mg twice daily, or if she would prefer to go back on atenolol at the previous dose, I would not be opposed for now while she equilibrates to the other medication changes. Clearly I would like for her to have better heart rate control as well as avoiding fluctuations in blood pressure.

## 2012-10-19 NOTE — Telephone Encounter (Signed)
Patient informed and prefers to go back to atenolol 50 mg daily.

## 2012-10-19 NOTE — Telephone Encounter (Signed)
Patient still having problems with new medications. Patient c/o elevated heart rate is still around 115, sweating, dizzy. Patient also states taht since her med changes, her BS have also been very elevated. Nurse informed patient that she needed to call her PCP about her elevated BS. Patient is wanting to know if she can go back to what she was taking originally.

## 2012-11-13 ENCOUNTER — Ambulatory Visit (INDEPENDENT_AMBULATORY_CARE_PROVIDER_SITE_OTHER): Payer: Medicare Other | Admitting: Family Medicine

## 2012-11-13 ENCOUNTER — Other Ambulatory Visit: Payer: Self-pay | Admitting: *Deleted

## 2012-11-13 ENCOUNTER — Encounter: Payer: Self-pay | Admitting: Family Medicine

## 2012-11-13 VITALS — BP 119/74 | HR 66 | Temp 97.1°F | Ht 62.5 in | Wt 175.4 lb

## 2012-11-13 DIAGNOSIS — K7689 Other specified diseases of liver: Secondary | ICD-10-CM

## 2012-11-13 DIAGNOSIS — E119 Type 2 diabetes mellitus without complications: Secondary | ICD-10-CM

## 2012-11-13 DIAGNOSIS — I10 Essential (primary) hypertension: Secondary | ICD-10-CM

## 2012-11-13 DIAGNOSIS — N949 Unspecified condition associated with female genital organs and menstrual cycle: Secondary | ICD-10-CM

## 2012-11-13 DIAGNOSIS — E039 Hypothyroidism, unspecified: Secondary | ICD-10-CM

## 2012-11-13 DIAGNOSIS — N951 Menopausal and female climacteric states: Secondary | ICD-10-CM

## 2012-11-13 DIAGNOSIS — R300 Dysuria: Secondary | ICD-10-CM

## 2012-11-13 DIAGNOSIS — K760 Fatty (change of) liver, not elsewhere classified: Secondary | ICD-10-CM

## 2012-11-13 LAB — POCT UA - MICROSCOPIC ONLY
Casts, Ur, LPF, POC: NEGATIVE
Yeast, UA: NEGATIVE

## 2012-11-13 LAB — POCT URINALYSIS DIPSTICK
Ketones, UA: NEGATIVE
Spec Grav, UA: 1.015
Urobilinogen, UA: NEGATIVE
pH, UA: 5

## 2012-11-13 LAB — POCT WET PREP WITH KOH

## 2012-11-13 MED ORDER — METRONIDAZOLE 1 % EX GEL: Freq: Every day | CUTANEOUS | Status: DC

## 2012-11-13 NOTE — Progress Notes (Signed)
Subjective:    Patient ID: Lisa Sutton, female    DOB: 1934/01/26, 79 y.o.   MRN: [id number]  HPI Patient comes in today for followup of chronic medical problems. She has recently seen Dr. Domenic Polite the cardiologist who changed her to Coreg 0.625 twice daily and change her lisinopril to 20/12.5 once daily to get better blood pressure control. She tried the Coreg but this did not work well so she went back on the atenolol 50 once a day. She also saw Dr. Laural Golden back in the late winter. Patient sees the endocrinologist Dr.Nida of her blood work including her A1c and lipids. She does have some discomfort passing her water. Her home blood pressures were reviewed and all indicate good control. Blood sugars in the morning usually run around 140 according to the patient. The other blood sugars during the day or good. Patient complains of vaginal irritation but no discharge.  Review of Systems  Constitutional: Positive for fatigue.  HENT: Positive for postnasal drip (due to allergies).   Eyes: Negative.   Respiratory: Negative.   Cardiovascular: Negative.   Gastrointestinal: Negative.   Genitourinary: Positive for dysuria. Negative for vaginal discharge.  Musculoskeletal: Negative.   Skin: Negative.   Allergic/Immunologic: Positive for environmental allergies.  Hematological: Negative.   Psychiatric/Behavioral: Positive for sleep disturbance (nightly).       Objective:   Physical Exam BP 119/74  Pulse 66  Temp(Src) 97.1 F (36.2 C) (Oral)  Ht 5' 2.5" (1.588 m)  Wt 175 lb 6.4 oz (79.561 kg)  BMI 31.55 kg/m2  The patient appeared well nourished and normally developed, alert and oriented to time and place. Speech, behavior and judgement appear normal. Vital signs as documented.  Head exam is unremarkable. No scleral icterus or pallor noted. The normal limit Neck is without jugular venous distension, thyromegally, or carotid bruits. Carotid upstrokes are brisk bilaterally. No cervical  adenopathy. Lungs are clear anteriorly and posteriorly to auscultation. Normal respiratory effort. Cardiac exam reveals regular rate and rhythm at 72 per minute. First and second heart sounds normal.  No murmurs, rubs or gallops.  Abdominal exam reveals normal bowl sounds, no masses, no organomegaly and no aortic enlargement. No inguinal adenopathy. No abdominal tenderness  or suprapubic tenderness. Extremities are nonedematous and both pedal pulses are normal. Skin without pallor or jaundice.  Warm and dry, without rash. Neurologic exam reveals normal deep tendon reflexes and normal sensation. Speculum exam and visual exam revealed an irritated external and slightly internal vaginal introitus. A bimanual exam was normal . A specimen was taken for wet prep.  Results for orders placed in visit on 11/13/12  POCT URINALYSIS DIPSTICK      Result Value Range   Color, UA gold     Clarity, UA clear     Glucose, UA negative     Bilirubin, UA negative     Ketones, UA negative     Spec Grav, UA 1.015     Blood, UA negative     pH, UA 5.0     Protein, UA trace     Urobilinogen, UA negative     Nitrite, UA negative     Leukocytes, UA small (1+)    POCT UA - MICROSCOPIC ONLY      Result Value Range   WBC, Ur, HPF, POC 10-15     RBC, urine, microscopic occ     Bacteria, U Microscopic few     Mucus, UA occ     Epithelial cells, urine  per micros few     Crystals, Ur, HPF, POC negative     Casts, Ur, LPF, POC negative     Yeast, UA negative             Assessment & Plan:  1. Dysuria - POCT urinalysis dipstick - POCT UA - Microscopic Only  2. Type 2 diabetes mellitus -Followed by Dr. Dorris Fetch  3. Hypothyroid  4. Fatty liver  5. Essential hypertension, benign  6. Menopausal symptoms  7. Vaginal discomfort -wet prep -Premarin cream one half applicator once weekly  8. Pyuria -Urine culture and sensitivity -Antibiotic treatment pending urine culture and sensitivity  Patient  Instructions  Fall precautions discussed Continue current meds and therapeutic lifestyle changes Try black cohosh over-the-counter for vasomotor spasm Will also try Premarin cream     We'll make decision about any antibiotics pending the results of urine culture and sensitivity

## 2012-11-13 NOTE — Patient Instructions (Addendum)
Fall precautions discussed Continue current meds and therapeutic lifestyle changes Try black cohosh over-the-counter for vasomotor spasm Will also try Premarin cream

## 2012-12-06 ENCOUNTER — Encounter (HOSPITAL_COMMUNITY): Payer: Medicare Other | Attending: Hematology and Oncology

## 2012-12-06 ENCOUNTER — Ambulatory Visit (HOSPITAL_COMMUNITY)
Admission: RE | Admit: 2012-12-06 | Discharge: 2012-12-06 | Disposition: A | Discharge status: Home / Self Care | Payer: Medicare Other | Source: Ambulatory Visit | Attending: Oncology | Admitting: Oncology

## 2012-12-06 DIAGNOSIS — R591 Generalized enlarged lymph nodes: Secondary | ICD-10-CM

## 2012-12-06 DIAGNOSIS — R599 Enlarged lymph nodes, unspecified: Secondary | ICD-10-CM

## 2012-12-06 LAB — CBC
HCT: 43.6 % (ref 36.0–46.0)
Hemoglobin: 14.6 g/dL (ref 12.0–15.0)
WBC: 9.9 10*3/uL (ref 4.0–10.5)

## 2012-12-06 LAB — COMPREHENSIVE METABOLIC PANEL
Alkaline Phosphatase: 100 U/L (ref 39–117)
BUN: 20 mg/dL (ref 6–23)
CO2: 28 mEq/L (ref 19–32)
Chloride: 98 mEq/L (ref 96–112)
GFR calc Af Amer: 61 mL/min — ABNORMAL LOW (ref >90)
GFR calc non Af Amer: 53 mL/min — ABNORMAL LOW (ref >90)
Glucose, Bld: 172 mg/dL — ABNORMAL HIGH (ref 70–99)
Potassium: 4.6 mEq/L (ref 3.5–5.1)
Total Bilirubin: 0.2 mg/dL — ABNORMAL LOW (ref 0.3–1.2)

## 2012-12-06 NOTE — Progress Notes (Signed)
Lisa Sutton presented for [employer]. Labs per MD order drawn via Peripheral Line 23 gauge needle inserted in LT AC  Good blood return present. Procedure without incident.  Needle removed intact. Patient tolerated procedure well.

## 2012-12-11 ENCOUNTER — Encounter (HOSPITAL_BASED_OUTPATIENT_CLINIC_OR_DEPARTMENT_OTHER): Payer: Medicare Other

## 2012-12-11 VITALS — BP 118/79 | HR 66 | Temp 97.6°F | Resp 20 | Wt 175.8 lb

## 2012-12-11 DIAGNOSIS — R599 Enlarged lymph nodes, unspecified: Secondary | ICD-10-CM

## 2012-12-11 DIAGNOSIS — R591 Generalized enlarged lymph nodes: Secondary | ICD-10-CM

## 2012-12-11 NOTE — Patient Instructions (Addendum)
[facility] Discharge Instructions  RECOMMENDATIONS MADE BY THE CONSULTANT AND ANY TEST RESULTS WILL BE SENT TO YOUR REFERRING PHYSICIAN.  EXAM FINDINGS BY THE PHYSICIAN TODAY AND SIGNS OR SYMPTOMS TO REPORT TO CLINIC OR PRIMARY PHYSICIAN:   Bloodwork in 1 year: (CBC diff, CMET, LDH)  Return in 1 year to see MD  Otherwise, exam was good.   Thank you for choosing [facility] to provide your oncology and hematology care.  To afford each patient quality time with our providers, please arrive at least 15 minutes before your scheduled appointment time.  With your help, our goal is to use those 15 minutes to complete the necessary work-up to ensure our physicians have the information they need to help with your evaluation and healthcare recommendations.    Effective January 1st, 2014, we ask that you re-schedule your appointment with our physicians should you arrive 10 or more minutes late for your appointment.  We strive to give you quality time with our providers, and arriving late affects you and other patients whose appointments are after yours.    Again, thank you for choosing [facility].  Our hope is that these requests will decrease the amount of time that you wait before being seen by our physicians.       _____________________________________________________________  Should you have questions after your visit to [facility], please contact our office at [phone] between the hours of 8:30 a.m. and 5:00 p.m.  Voicemails left after 4:30 p.m. will not be returned until the following business day.  For prescription refill requests, have your pharmacy contact our office with your prescription refill request.

## 2012-12-11 NOTE — Progress Notes (Signed)
[place] [phone]   [phone]) [place], [place] [address]  DIAGNOSIS: Lymphadenopathy  ONCOLOGIC HISTORY:Per Dr Jaclyn Prime  Note ;Mild lymphadenopathy in the retroperitoneum. They are  stable in size compared to June 2009, up slightly compared to June 2010 .    INTERVAL HISTORY:   Lisa Sutton 79 y.o. female returns to the clinic today for scheduled follow up of mild lymphadenopathy diagnosed several years ago.  She denies any new problem.She denies , SOB, weight loss, fever , palpable lymphadenopathy. Or night sweats. She had CT abdomen on 12/01/12 for right flank pain and the report has been noted. I was unable to open the CT images for review.   MEDICAL HISTORY: Past Medical History  Diagnosis Date  . Type 2 diabetes mellitus   . Essential hypertension, benign   . GERD (gastroesophageal reflux disease)   . Osteoporosis   . Obesity   . Hypothyroidism   . Dysphagia   . Hepatitis B   . Fatty liver     ALLERGIES:  is allergic to zetia; livalo; metformin and related; and zocor.  MEDICATIONS:  Current Outpatient Prescriptions  Medication Sig Dispense Refill  . ADVANCED FIBER COMPLEX PO Take by mouth daily. These are gummies      . atenolol (TENORMIN) 50 MG tablet Take 50 mg by mouth daily.      . Cholecalciferol (VITAMIN D) 1000 UNITS capsule Take 1,000 Units by mouth daily.        . CRESTOR 5 MG tablet Take 5 mg by mouth daily.       . fish oil-omega-3 fatty acids 1000 MG capsule Take 1,000 mg by mouth daily.        . fluticasone (FLONASE) 50 MCG/ACT nasal spray       . ibuprofen (ADVIL,MOTRIN) 200 MG tablet Take 200 mg by mouth every 6 (six) hours as needed. For pain      . levothyroxine (SYNTHROID, LEVOTHROID) 112 MCG tablet Take 112 mcg by mouth daily.       Marland Kitchen lisinopril-hydrochlorothiazide (PRINZIDE,ZESTORETIC) 20-12.5 MG per tablet Take 1 tablet by mouth daily.       . metFORMIN (GLUCOPHAGE) 500 MG tablet Take 500 mg by mouth 2 (two) times daily with a meal.      . Multiple Vitamin (MULITIVITAMIN WITH MINERALS) TABS Take 1 tablet by mouth daily.      Marland Kitchen omeprazole (PRILOSEC) 20 MG capsule Take 20 mg by mouth daily.      . vitamin B-12 (CYANOCOBALAMIN) 500 MCG tablet Take 500 mcg by mouth daily.      . metroNIDAZOLE (METROGEL) 1 % gel Apply topically daily.  45 g  0  . [DISCONTINUED] calcium citrate-vitamin D 200-200 MG-UNIT TABS Take 1 tablet by mouth daily.         No current facility-administered medications for this visit.    SURGICAL HISTORY:  Past Surgical History  Procedure Laterality Date  . Abdominal hysterectomy    . Deviated septum repair    . Knee arthroscopy      left knee 2003  . Cataract extraction, bilateral       REVIEW OF SYSTEMS: As above otherwise negative.   PHYSICAL EXAMINATION:  Blood pressure 118/79, pulse 66, temperature 97.6 F (36.4 C), temperature source Oral, resp. rate 20, weight 175 lb 12.8 oz (79.742 kg). GENERAL: No distress. SKIN:  No rashes or significant lesions  HEAD:  Normocephalic, No masses, lesions, tenderness or abnormalities  EYES: Conjunctiva are pink and non-injected  ENT: External ears normal ,lips, buccal mucosa, and tongue normal and mucous membranes are moist  LYMPH: No palpable lymphadenopathy in the neck, axilla, supraclavicular or inguinal lymph node areas. LUNGS: clear to auscultation , no crackles or wheezes HEART: regular rate & rhythm, no murmurs, no gallops, S1 normal and S2 normal  ABDOMEN: Abdomen soft, non-tender, normal bowel sounds, no masses or organomegaly and no hepatosplenomegaly palpable EXTREMITIES: No edema, no skin discoloration or tenderness NEURO: alert & oriented , no focal motor/sensory deficits.     LABORATORY DATA: Lab Results  Component Value Date   WBC 9.9 12/06/2012   HGB 14.6 12/06/2012   HCT 43.6 12/06/2012   MCV 90.8 12/06/2012   PLT 341 12/06/2012       Chemistry      Component Value Date/Time   NA 135 12/06/2012 0913   K 4.6 12/06/2012 0913   CL 98 12/06/2012 0913   CO2 28 12/06/2012 0913   BUN 20 12/06/2012 0913   CREATININE 1.01 12/06/2012 0913      Component Value Date/Time   CALCIUM 9.9 12/06/2012 0913   ALKPHOS 100 12/06/2012 0913   AST 22 12/06/2012 0913   ALT 21 12/06/2012 0913   BILITOT 0.2* 12/06/2012 0913       RADIOGRAPHIC STUDIES: Ct scan report 12/01/12 reviewed and noted. No abdominal lymphadenopathy mentioned.   ASSESSMENT: Patient is clinically stable without any palpable peripheral lymphadenopathy or B symptoms. I feels that yearly  clinical visits and imaging as clinically indicated  is a reasonable approach at this time.    PLAN:  1. Return to clinic in 1 year. 2.CBC/CMP/LDH prior to the next visit. 3. Patient educated on B symptoms and enlarging lymph nodes and to scall [place] if this happens prior to scheduled visit.  All questions were satisfactorily answered. Patient knows to call if  any concern arises.  I spent more than 50 % counseling the patient face to face. The total time spent in the appointment was  30 minutes.    Verlan Friends, MD FACP. Hematology/Oncology.

## 2012-12-20 ENCOUNTER — Encounter: Payer: Self-pay | Admitting: Cardiology

## 2012-12-20 ENCOUNTER — Ambulatory Visit (INDEPENDENT_AMBULATORY_CARE_PROVIDER_SITE_OTHER): Payer: Medicare Other | Admitting: Cardiology

## 2012-12-20 VITALS — BP 127/77 | HR 65 | Ht 63.0 in | Wt 175.0 lb

## 2012-12-20 DIAGNOSIS — I10 Essential (primary) hypertension: Secondary | ICD-10-CM

## 2012-12-20 NOTE — Assessment & Plan Note (Signed)
Blood pressure control is much better. No further medication adjustments at this time. Would continue Prinzide at the current dose, keep ongoing followup with Dr. Laurance Flatten. Recent potassium was normal. May eventually need a low-dose potassium supplement, but would defer for now. We can see her back as needed.

## 2012-12-20 NOTE — Patient Instructions (Addendum)
   No more cardiac work up needed. Please follow up as needed. Your physician recommends that you continue on your current medications as directed. Please refer to the Current Medication list given to you today.

## 2012-12-20 NOTE — Progress Notes (Signed)
Clinical Summary Ms. Piontek is a 79 y.o.female last seen in May. Medication adjustments were made including a switch to lisinopril HCTZ from ACE inhibitor alone, and change from Tenormin to Coreg. She ultimately switched back to Tenormin, and has done very well. Her blood pressure has come down quite nicely, I reviewed her home checks.  Followup lab work from 7/16 showed potassium 4.6, BUN 20, creatinine 1.0.   Allergies  Allergen Reactions  . Zetia (Ezetimibe)   . Livalo (Pitavastatin) Other (See Comments)  . Metformin And Related Diarrhea  . Zocor (Simvastatin) Other (See Comments)    Current Outpatient Prescriptions  Medication Sig Dispense Refill  . ADVANCED FIBER COMPLEX PO Take by mouth daily. These are gummies      . atenolol (TENORMIN) 50 MG tablet Take 50 mg by mouth daily.      . Cholecalciferol (VITAMIN D) 1000 UNITS capsule Take 1,000 Units by mouth daily.        . CRESTOR 5 MG tablet Take 5 mg by mouth daily.       . fish oil-omega-3 fatty acids 1000 MG capsule Take 1,000 mg by mouth daily.        . fluticasone (FLONASE) 50 MCG/ACT nasal spray Place 1 spray into the nose as needed.       Marland Kitchen ibuprofen (ADVIL,MOTRIN) 200 MG tablet Take 200 mg by mouth every 6 (six) hours as needed. For pain      . levothyroxine (SYNTHROID, LEVOTHROID) 112 MCG tablet Take 112 mcg by mouth daily.       Marland Kitchen lisinopril-hydrochlorothiazide (PRINZIDE,ZESTORETIC) 20-12.5 MG per tablet Take 1 tablet by mouth daily.      . metFORMIN (GLUCOPHAGE) 500 MG tablet Take 500 mg by mouth 2 (two) times daily with a meal.      . Multiple Vitamin (MULITIVITAMIN WITH MINERALS) TABS Take 1 tablet by mouth every other day.       Marland Kitchen omeprazole (PRILOSEC) 20 MG capsule Take 20 mg by mouth as needed.       . vitamin B-12 (CYANOCOBALAMIN) 500 MCG tablet Take 500 mcg by mouth 3 (three) times a week.       . [DISCONTINUED] calcium citrate-vitamin D 200-200 MG-UNIT TABS Take 1 tablet by mouth daily.         No current  facility-administered medications for this visit.    Past Medical History  Diagnosis Date  . Type 2 diabetes mellitus   . Essential hypertension, benign   . GERD (gastroesophageal reflux disease)   . Osteoporosis   . Obesity   . Hypothyroidism   . Dysphagia   . Hepatitis B   . Fatty liver     Social History Ms. Rocco reports that she has never smoked. She has never used smokeless tobacco. Ms. Rabun reports that she does not drink alcohol.  Review of Systems States she had a kidney stone since I saw her, past without incident. Otherwise negative.  Physical Examination Filed Vitals:   12/20/12 0815  BP: 127/77  Pulse: 65   Filed Weights   12/20/12 0815  Weight: 175 lb (79.379 kg)    Comfortable.  HEENT: Conjunctiva and lids normal, oropharynx clear.  Neck: Supple, no elevated JVP or carotid bruits, no thyromegaly.  Lungs: Clear to auscultation, nonlabored breathing at rest.  Cardiac: Regular rate and rhythm, no S3 or significant systolic murmur, no pericardial rub.  Abdomen: Soft, nontender, bowel sounds present. No bruit.  Extremities: No pitting edema, distal pulses 2+.  Problem List and Plan   Essential hypertension, benign Blood pressure control is much better. No further medication adjustments at this time. Would continue Prinzide at the current dose, keep ongoing followup with Dr. Laurance Flatten. Recent potassium was normal. May eventually need a low-dose potassium supplement, but would defer for now. We can see her back as needed.    Satira Sark, M.D., F.A.C.C.

## 2012-12-22 DIAGNOSIS — N200 Calculus of kidney: Secondary | ICD-10-CM

## 2012-12-22 HISTORY — DX: Calculus of kidney: N20.0

## 2013-03-29 ENCOUNTER — Other Ambulatory Visit: Payer: Self-pay

## 2013-04-12 ENCOUNTER — Ambulatory Visit (INDEPENDENT_AMBULATORY_CARE_PROVIDER_SITE_OTHER): Payer: Medicare Other | Admitting: Family Medicine

## 2013-04-12 ENCOUNTER — Encounter: Payer: Self-pay | Admitting: Family Medicine

## 2013-04-12 VITALS — BP 131/89 | HR 67 | Temp 97.4°F | Ht 63.0 in | Wt 177.0 lb

## 2013-04-12 DIAGNOSIS — G4700 Insomnia, unspecified: Secondary | ICD-10-CM

## 2013-04-12 DIAGNOSIS — F5101 Primary insomnia: Secondary | ICD-10-CM | POA: Insufficient documentation

## 2013-04-12 DIAGNOSIS — Z23 Encounter for immunization: Secondary | ICD-10-CM

## 2013-04-12 DIAGNOSIS — E039 Hypothyroidism, unspecified: Secondary | ICD-10-CM

## 2013-04-12 DIAGNOSIS — K219 Gastro-esophageal reflux disease without esophagitis: Secondary | ICD-10-CM

## 2013-04-12 DIAGNOSIS — I10 Essential (primary) hypertension: Secondary | ICD-10-CM

## 2013-04-12 DIAGNOSIS — R591 Generalized enlarged lymph nodes: Secondary | ICD-10-CM

## 2013-04-12 DIAGNOSIS — E119 Type 2 diabetes mellitus without complications: Secondary | ICD-10-CM

## 2013-04-12 DIAGNOSIS — R599 Enlarged lymph nodes, unspecified: Secondary | ICD-10-CM

## 2013-04-12 DIAGNOSIS — E785 Hyperlipidemia, unspecified: Secondary | ICD-10-CM | POA: Insufficient documentation

## 2013-04-12 MED ORDER — LISINOPRIL-HYDROCHLOROTHIAZIDE 20-12.5 MG PO TABS: 2.0000 | ORAL_TABLET | Freq: Every day | ORAL | Status: DC

## 2013-04-12 MED ORDER — ATENOLOL 50 MG PO TABS: 50.0000 mg | ORAL_TABLET | Freq: Every day | ORAL | Status: DC

## 2013-04-12 MED ORDER — TRAZODONE HCL 50 MG PO TABS: 25.0000 mg | ORAL_TABLET | Freq: Every evening | ORAL | Status: DC | PRN

## 2013-04-12 MED ORDER — LEVOTHYROXINE SODIUM 112 MCG PO TABS: 112.0000 ug | ORAL_TABLET | Freq: Every day | ORAL | Status: DC

## 2013-04-12 NOTE — Patient Instructions (Addendum)
Continue current medications. Continue good therapeutic lifestyle changes which include good diet and exercise. Fall precautions discussed with patient. Continue to followup with the gastroenterologist and the endocrinologist When visiting the gastroenterologist confirm the date due. for your next colonoscopy You will receive your Prevnar shot today We will schedule you today for your pelvic exam in March or April, you'll also need a chest x-ray at the time of your pelvic exam Continue to monitor blood sugars closely

## 2013-04-12 NOTE — Progress Notes (Signed)
Subjective:    Patient ID: Lisa Sutton, female    DOB: Oct 26, 1933, 79 y.o.   MRN: [id number]  HPI Pt here for follow up and management of chronic medical problems. Patient complains of insomnia and heat intolerance. She continues to followup with a gastroenterologist and endocrinologist. She also has a follow up visit planned with the hematologist/oncologist.     Patient Active Problem List   Diagnosis Date Noted  . Essential hypertension, benign 10/13/2012  . GERD (gastroesophageal reflux disease) 04/25/2012  . Fatty liver 08/09/2011  . Hepatitis B 08/09/2011  . Dysphagia 08/09/2011  . Type 2 diabetes mellitus 08/09/2011  . Hypothyroid 08/09/2011   Outpatient Encounter Prescriptions as of 04/12/2013  Medication Sig  . ADVANCED FIBER COMPLEX PO Take by mouth daily. These are gummies  . atenolol (TENORMIN) 50 MG tablet Take 50 mg by mouth daily.  . Cholecalciferol (VITAMIN D) 1000 UNITS capsule Take 1,000 Units by mouth daily.    . CRESTOR 5 MG tablet Take 5 mg by mouth daily.   . fish oil-omega-3 fatty acids 1000 MG capsule Take 1,000 mg by mouth daily.    Marland Kitchen ibuprofen (ADVIL,MOTRIN) 200 MG tablet Take 200 mg by mouth every 6 (six) hours as needed. For pain  . levothyroxine (SYNTHROID, LEVOTHROID) 112 MCG tablet Take 112 mcg by mouth daily.   Marland Kitchen lisinopril-hydrochlorothiazide (PRINZIDE,ZESTORETIC) 20-12.5 MG per tablet Take 1 tablet by mouth daily.  . metFORMIN (GLUCOPHAGE) 500 MG tablet Take 500 mg by mouth 2 (two) times daily with a meal.  . Multiple Vitamin (MULITIVITAMIN WITH MINERALS) TABS Take 1 tablet by mouth every other day.   Marland Kitchen omeprazole (PRILOSEC) 20 MG capsule Take 20 mg by mouth as needed.   . vitamin B-12 (CYANOCOBALAMIN) 500 MCG tablet Take 500 mcg by mouth 3 (three) times a week.   . [DISCONTINUED] fluticasone (FLONASE) 50 MCG/ACT nasal spray Place 1 spray into the nose as needed.     Review of Systems  Constitutional: Negative.   HENT: Negative.   Eyes:  Negative.   Respiratory: Negative.   Cardiovascular: Negative.   Gastrointestinal: Negative.   Endocrine: Positive for heat intolerance ("hot flashes").  Genitourinary: Negative.   Musculoskeletal: Negative.   Skin: Negative.   Allergic/Immunologic: Negative.   Neurological: Negative.   Hematological: Negative.   Psychiatric/Behavioral: Positive for sleep disturbance (does not sleep well at all).       Objective:   Physical Exam  Nursing note and vitals reviewed. Constitutional: She is oriented to person, place, and time. She appears well-developed and well-nourished. No distress.  HENT:  Head: Normocephalic and atraumatic.  Right Ear: External ear normal.  Left Ear: External ear normal.  Nose: Nose normal.  Mouth/Throat: Oropharynx is clear and moist. No oropharyngeal exudate.  Eyes: Conjunctivae and EOM are normal. Pupils are equal, round, and reactive to light. Right eye exhibits no discharge. Left eye exhibits no discharge. No scleral icterus.  Neck: Normal range of motion. Neck supple. No thyromegaly present.  Cardiovascular: Normal rate, regular rhythm, normal heart sounds and intact distal pulses.  Exam reveals no gallop and no friction rub.   No murmur heard. 72 per minute  Pulmonary/Chest: Effort normal and breath sounds normal. No respiratory distress. She has no wheezes. She has no rales. She exhibits no tenderness.  Abdominal: Soft. Bowel sounds are normal. She exhibits no mass. There is no tenderness. There is no rebound and no guarding.  Musculoskeletal: Normal range of motion. She exhibits no edema and no tenderness.  Lymphadenopathy:    She has no cervical adenopathy.  Neurological: She is alert and oriented to person, place, and time. She has normal reflexes. No cranial nerve deficit.  Skin: Skin is warm and dry.  Psychiatric: She has a normal mood and affect. Her behavior is normal. Judgment and thought content normal.   BP 131/89  Pulse 67  Temp(Src) 97.4  F (36.3 C) (Oral)  Ht 5\' 3"  (1.6 m)  Wt 177 lb (80.287 kg)  BMI 31.36 kg/m2        Assessment & Plan:   1. Hyperlipidemia   2. Hypothyroid   3. GERD (gastroesophageal reflux disease)   4. Essential hypertension, benign   5. Type 2 diabetes mellitus   6. Lymphadenopathy   7. Insomnia    Orders Placed This Encounter  Procedures  . POCT UA - Microalbumin   Patient will have the remainder of her labs done at an outside lab and these reports will come back to [place]  Meds ordered this encounter  Medications  . lisinopril-hydrochlorothiazide (PRINZIDE,ZESTORETIC) 20-12.5 MG per tablet    Sig: Take 2 tablets by mouth daily.    Dispense:  180 tablet    Refill:  3  . levothyroxine (SYNTHROID, LEVOTHROID) 112 MCG tablet    Sig: Take 1 tablet (112 mcg total) by mouth daily.    Dispense:  90 tablet    Refill:  3  . atenolol (TENORMIN) 50 MG tablet    Sig: Take 1 tablet (50 mg total) by mouth daily.    Dispense:  90 tablet    Refill:  3  . traZODone (DESYREL) 50 MG tablet    Sig: Take 0.5-1 tablets (25-50 mg total) by mouth at bedtime as needed for sleep.    Dispense:  30 tablet    Refill:  3   Patient Instructions  Continue current medications. Continue good therapeutic lifestyle changes which include good diet and exercise. Fall precautions discussed with patient. Continue to followup with the gastroenterologist and the endocrinologist When visiting the gastroenterologist confirm the date due. for your next colonoscopy You will receive your Prevnar shot today We will schedule you today for your pelvic exam in March or April, you'll also need a chest x-ray at the time of your pelvic exam Continue to monitor blood sugars closely   Arrie Senate MD

## 2013-04-16 ENCOUNTER — Other Ambulatory Visit: Payer: Self-pay | Admitting: Family Medicine

## 2013-04-16 LAB — CBC
HCT: 40 % (ref 36.0–46.0)
Hemoglobin: 13.9 g/dL (ref 12.0–15.0)
MCH: 30.3 pg (ref 26.0–34.0)
MCV: 87.3 fL (ref 78.0–100.0)
Platelets: 327 10*3/uL (ref 150–400)
RBC: 4.58 MIL/uL (ref 3.87–5.11)

## 2013-04-17 LAB — NMR LIPOPROFILE WITH LIPIDS
Cholesterol, Total: 147 mg/dL (ref <200)
HDL Particle Number: 36.2 umol/L (ref >=30.5)
HDL-C: 53 mg/dL (ref >=40)
LDL (calc): 60 mg/dL (ref <100)
LDL Particle Number: 967 nmol/L (ref <1000)
LP-IR Score: 67 — ABNORMAL HIGH (ref <=45)
Triglycerides: 172 mg/dL — ABNORMAL HIGH (ref <150)
VLDL Size: 47.2 nm — ABNORMAL HIGH (ref <=46.6)

## 2013-05-01 ENCOUNTER — Ambulatory Visit (INDEPENDENT_AMBULATORY_CARE_PROVIDER_SITE_OTHER): Payer: Medicare Other | Admitting: Internal Medicine

## 2013-05-01 ENCOUNTER — Encounter (INDEPENDENT_AMBULATORY_CARE_PROVIDER_SITE_OTHER): Payer: Self-pay | Admitting: Internal Medicine

## 2013-05-01 VITALS — BP 128/70 | HR 72 | Temp 97.9°F | Resp 18 | Ht 63.0 in | Wt 176.7 lb

## 2013-05-01 DIAGNOSIS — B181 Chronic viral hepatitis B without delta-agent: Secondary | ICD-10-CM

## 2013-05-01 DIAGNOSIS — K219 Gastro-esophageal reflux disease without esophagitis: Secondary | ICD-10-CM

## 2013-05-01 MED ORDER — OMEPRAZOLE 20 MG PO CPDR: 20.0000 mg | DELAYED_RELEASE_CAPSULE | Freq: Every day | ORAL | Status: DC

## 2013-05-01 NOTE — Progress Notes (Signed)
Presenting complaint;  Followup for GERD and history of positive hepatitis B surface antigen.  Subjective:  Patient is 79 year old Caucasian female who is here for yearly visit. She has no complaints. She remains very active and plays golf regularly. She has very good appetite. She denies dysphagia nausea vomiting abdominal pain melena or rectal bleeding. She has intermittent heartburn and uses omeprazole on an as-needed basis. She has history of esophageal stricture and underwent dilation in March last year. She has logged on to my chart and noted and at her. She states her chart states she had colonoscopy in 2014 which is not true. Her last colonoscopy was in October 2010. She has history of colonic adenoma but none found on our last exam. She has history of peripancreatic lymphadenopathy which has been stable. She is being followed at oncology clinic at [facility].   Current Medications: Current Outpatient Prescriptions  Medication Sig Dispense Refill  . ADVANCED FIBER COMPLEX PO Take by mouth daily. These are gummies      . atenolol (TENORMIN) 50 MG tablet Take 1 tablet (50 mg total) by mouth daily.  90 tablet  3  . Cholecalciferol (VITAMIN D) 1000 UNITS capsule Take 1,000 Units by mouth daily.        . CRESTOR 5 MG tablet Take 5 mg by mouth daily.       . fish oil-omega-3 fatty acids 1000 MG capsule Take 1,000 mg by mouth daily.        Marland Kitchen ibuprofen (ADVIL,MOTRIN) 200 MG tablet Take 200 mg by mouth every 6 (six) hours as needed. For pain      . levothyroxine (SYNTHROID, LEVOTHROID) 112 MCG tablet Take 1 tablet (112 mcg total) by mouth daily.  90 tablet  3  . lisinopril-hydrochlorothiazide (PRINZIDE,ZESTORETIC) 20-12.5 MG per tablet Take 2 tablets by mouth daily.  180 tablet  3  . metFORMIN (GLUCOPHAGE) 500 MG tablet Take 500 mg by mouth 2 (two) times daily with a meal.      . Multiple Vitamin (MULITIVITAMIN WITH MINERALS) TABS Take 1 tablet by mouth every other day.       Marland Kitchen omeprazole (PRILOSEC)  20 MG capsule Take 20 mg by mouth as needed.       . traZODone (DESYREL) 50 MG tablet Take 0.5-1 tablets (25-50 mg total) by mouth at bedtime as needed for sleep.  30 tablet  3  . vitamin B-12 (CYANOCOBALAMIN) 500 MCG tablet Take 500 mcg by mouth 3 (three) times a week.       . [DISCONTINUED] calcium citrate-vitamin D 200-200 MG-UNIT TABS Take 1 tablet by mouth daily.         No current facility-administered medications for this visit.     Objective: Blood pressure 128/70, pulse 72, temperature 97.9 F (36.6 C), temperature source Oral, resp. rate 18, height 5\' 3"  (1.6 m), weight 176 lb 11.2 oz (80.151 kg). Patient is alert and in no acute distress. Conjunctiva is pink. Sclera is nonicteric Oropharyngeal mucosa is normal. No neck masses or thyromegaly noted. Cardiac exam with regular rhythm normal S1 and S2. No murmur or gallop noted. Lungs are clear to auscultation. Abdomen is full but soft and nontender without organomegaly or masses.  No LE edema or clubbing noted.  Labs/studies Results: Lab data from 04/16/2013. WBC 8.6, H&H 13.9 and 40.0 and platelet count 327 Bilirubin 0.3, AP 88, AST 19, ALT 18 and albumin 4.3. Serum calcium 9.6. BUN 27 and creatinine 0.98     Assessment:  #1. GERD. She  has history of esophageal stricture and was last dilated in March 2013. She needs to be using PPI on a regular basis rather than when necessary. #2. History of inactive hepatitis B carrier. HBV DNA was undetectable. Her transaminases remain normal. No indication for treatment. #3. History of colonic adenoma. Last colonoscopy was in October 2010. May consider colonoscopy next year.    Plan:  Patient advised to take omeprazole 20 mg by mouth every morning. Prescription given. She will have hepatitis B surface antigen and hepatitis B virus antibody with her next blood in 3 months. Office visit in one year.

## 2013-05-01 NOTE — Patient Instructions (Signed)
Next blood work for a hepatitis B antigen and hepatitis B surface antibody would be in March 2015.

## 2013-05-02 ENCOUNTER — Encounter (INDEPENDENT_AMBULATORY_CARE_PROVIDER_SITE_OTHER): Payer: Self-pay | Admitting: *Deleted

## 2013-05-08 ENCOUNTER — Telehealth (INDEPENDENT_AMBULATORY_CARE_PROVIDER_SITE_OTHER): Payer: Self-pay | Admitting: *Deleted

## 2013-05-08 ENCOUNTER — Other Ambulatory Visit (INDEPENDENT_AMBULATORY_CARE_PROVIDER_SITE_OTHER): Payer: Self-pay | Admitting: *Deleted

## 2013-05-08 ENCOUNTER — Ambulatory Visit (INDEPENDENT_AMBULATORY_CARE_PROVIDER_SITE_OTHER): Payer: Medicare Other | Admitting: Internal Medicine

## 2013-05-08 DIAGNOSIS — K219 Gastro-esophageal reflux disease without esophagitis: Secondary | ICD-10-CM

## 2013-05-08 MED ORDER — OMEPRAZOLE 20 MG PO CPDR: 20.0000 mg | DELAYED_RELEASE_CAPSULE | Freq: Every day | ORAL | Status: DC

## 2013-05-08 NOTE — Telephone Encounter (Signed)
This needs to be sent to [facility].

## 2013-05-08 NOTE — Telephone Encounter (Signed)
Tonika LM stating Dr. Laural Golden was to send the Prilosec to [facility]. It is showing the Rx was sent to [facility] on 05/01/13.

## 2013-05-08 NOTE — Telephone Encounter (Signed)
I have sent this corrected request to Dr.Rehman.

## 2013-05-09 NOTE — Telephone Encounter (Signed)
LM on Daniesha's voice message machine that her Rx has been sent to [facility] on 05/08/13 by Dr. Laural Golden.

## 2013-05-25 ENCOUNTER — Other Ambulatory Visit: Payer: Self-pay

## 2013-05-25 MED ORDER — ALPRAZOLAM 0.25 MG PO TABS: 0.2500 mg | ORAL_TABLET | Freq: Two times a day (BID) | ORAL | Status: DC | PRN

## 2013-05-25 NOTE — Telephone Encounter (Signed)
This is okay, call patient to pick up prescription

## 2013-05-25 NOTE — Telephone Encounter (Signed)
Last seen 04/12/13  DWM    This med not on EPIC list  If approved print for mail order and route to nurse

## 2013-05-29 ENCOUNTER — Other Ambulatory Visit (HOSPITAL_COMMUNITY): Payer: Self-pay | Admitting: Hematology and Oncology

## 2013-05-29 DIAGNOSIS — R591 Generalized enlarged lymph nodes: Secondary | ICD-10-CM

## 2013-05-30 ENCOUNTER — Telehealth: Payer: Self-pay | Admitting: *Deleted

## 2013-05-30 ENCOUNTER — Other Ambulatory Visit: Payer: Self-pay | Admitting: *Deleted

## 2013-05-30 DIAGNOSIS — R591 Generalized enlarged lymph nodes: Secondary | ICD-10-CM

## 2013-05-30 DIAGNOSIS — G4700 Insomnia, unspecified: Secondary | ICD-10-CM

## 2013-05-30 DIAGNOSIS — K219 Gastro-esophageal reflux disease without esophagitis: Secondary | ICD-10-CM

## 2013-05-30 MED ORDER — LISINOPRIL-HYDROCHLOROTHIAZIDE 20-12.5 MG PO TABS: 2.0000 | ORAL_TABLET | Freq: Every day | ORAL | Status: DC

## 2013-05-30 MED ORDER — METFORMIN HCL 500 MG PO TABS: 500.0000 mg | ORAL_TABLET | Freq: Two times a day (BID) | ORAL | Status: DC

## 2013-05-30 MED ORDER — TRAZODONE HCL 50 MG PO TABS: 25.0000 mg | ORAL_TABLET | Freq: Every evening | ORAL | Status: DC | PRN

## 2013-05-30 MED ORDER — ROSUVASTATIN CALCIUM 5 MG PO TABS: 5.0000 mg | ORAL_TABLET | Freq: Every day | ORAL | Status: DC

## 2013-05-30 MED ORDER — OMEPRAZOLE 20 MG PO CPDR: 20.0000 mg | DELAYED_RELEASE_CAPSULE | Freq: Every day | ORAL | Status: DC

## 2013-05-30 MED ORDER — LEVOTHYROXINE SODIUM 112 MCG PO TABS: 112.0000 ug | ORAL_TABLET | Freq: Every day | ORAL | Status: DC

## 2013-05-30 MED ORDER — LISINOPRIL-HYDROCHLOROTHIAZIDE 20-12.5 MG PO TABS
2.0000 | ORAL_TABLET | Freq: Every day | ORAL | Status: DC
Start: 2013-05-30 — End: 2013-12-14

## 2013-05-30 MED ORDER — ATENOLOL 50 MG PO TABS: 50.0000 mg | ORAL_TABLET | Freq: Every day | ORAL | Status: DC

## 2013-05-30 NOTE — Telephone Encounter (Signed)
rxs done

## 2013-05-30 NOTE — Telephone Encounter (Signed)
Patient aware to pick up 

## 2013-06-27 ENCOUNTER — Telehealth: Payer: Self-pay | Admitting: Family Medicine

## 2013-06-28 NOTE — Telephone Encounter (Signed)
Faxed 06/28/13 pt aware

## 2013-07-09 ENCOUNTER — Other Ambulatory Visit: Payer: Self-pay

## 2013-07-09 MED ORDER — GLUCOSE BLOOD VI STRP: ORAL_STRIP | Status: DC

## 2013-07-16 ENCOUNTER — Other Ambulatory Visit (INDEPENDENT_AMBULATORY_CARE_PROVIDER_SITE_OTHER): Payer: Self-pay | Admitting: *Deleted

## 2013-07-16 DIAGNOSIS — B181 Chronic viral hepatitis B without delta-agent: Secondary | ICD-10-CM

## 2013-07-17 LAB — HEPATITIS B SURFACE ANTIBODY,QUALITATIVE: Hep B S Ab: NEGATIVE

## 2013-07-17 LAB — HEPATITIS B SURFACE ANTIGEN: Hepatitis B Surface Ag: NEGATIVE

## 2013-08-01 ENCOUNTER — Ambulatory Visit (INDEPENDENT_AMBULATORY_CARE_PROVIDER_SITE_OTHER): Payer: Commercial Managed Care - HMO | Admitting: General Practice

## 2013-08-01 VITALS — BP 147/82 | HR 63 | Temp 97.3°F | Ht 63.0 in | Wt 175.4 lb

## 2013-08-01 DIAGNOSIS — L293 Anogenital pruritus, unspecified: Secondary | ICD-10-CM

## 2013-08-01 DIAGNOSIS — N898 Other specified noninflammatory disorders of vagina: Secondary | ICD-10-CM

## 2013-08-01 DIAGNOSIS — J029 Acute pharyngitis, unspecified: Secondary | ICD-10-CM

## 2013-08-01 LAB — POCT RAPID STREP A (OFFICE): Rapid Strep A Screen: NEGATIVE

## 2013-08-01 MED ORDER — CLOTRIMAZOLE 1 % EX CREA: 1.0000 "application " | TOPICAL_CREAM | Freq: Two times a day (BID) | CUTANEOUS | Status: AC

## 2013-08-01 NOTE — Patient Instructions (Signed)
Sore Throat A sore throat is pain, burning, irritation, or scratchiness of the throat. There is often pain or tenderness when swallowing or talking. A sore throat may be accompanied by other symptoms, such as coughing, sneezing, fever, and swollen neck glands. A sore throat is often the first sign of another sickness, such as a cold, flu, strep throat, or mononucleosis (commonly known as mono). Most sore throats go away without medical treatment. CAUSES  The most common causes of a sore throat include:  A viral infection, such as a cold, flu, or mono.  A bacterial infection, such as strep throat, tonsillitis, or whooping cough.  Seasonal allergies.  Dryness in the air.  Irritants, such as smoke or pollution.  Gastroesophageal reflux disease (GERD). HOME CARE INSTRUCTIONS   Only take over-the-counter medicines as directed by your caregiver.  Drink enough fluids to keep your urine clear or pale yellow.  Rest as needed.  Try using throat sprays, lozenges, or sucking on hard candy to ease any pain (if older than 4 years or as directed).  Sip warm liquids, such as broth, herbal tea, or warm water with honey to relieve pain temporarily. You may also eat or drink cold or frozen liquids such as frozen ice pops.  Gargle with salt water (mix 1 tsp salt with 8 oz of water).  Do not smoke and avoid secondhand smoke.  Put a cool-mist humidifier in your bedroom at night to moisten the air. You can also turn on a hot shower and sit in the bathroom with the door closed for 5 10 minutes. SEEK IMMEDIATE MEDICAL CARE IF:  You have difficulty breathing.  You are unable to swallow fluids, soft foods, or your saliva.  You have increased swelling in the throat.  Your sore throat does not get better in 7 days.  You have nausea and vomiting.  You have a fever or persistent symptoms for more than 2 3 days.  You have a fever and your symptoms suddenly get worse. MAKE SURE YOU:   Understand  these instructions.  Will watch your condition.  Will get help right away if you are not doing well or get worse. Document Released: 06/17/2004 Document Revised: 04/26/2012 Document Reviewed: 01/16/2012 [employer] Patient Information 2014 [employer].  

## 2013-08-01 NOTE — Progress Notes (Signed)
   Subjective:    Patient ID: Lisa Sutton, female    DOB: 21-Dec-1933, 80 y.o.   MRN: [id number]  Sore Throat  This is a new problem. The current episode started in the past 7 days. The problem has been gradually worsening. Neither side of throat is experiencing more pain than the other. There has been no fever. The pain is at a severity of 9/10. Pertinent negatives include no congestion, coughing, shortness of breath or vomiting. She has had exposure to strep. She has tried gargles for the symptoms. The treatment provided mild relief.  Complains of labia majora irritation. Denies vaginal discharge.    Review of Systems  Constitutional: Negative for fever and chills.  HENT: Positive for sore throat. Negative for congestion.   Respiratory: Negative for cough, chest tightness and shortness of breath.   Cardiovascular: Negative for chest pain and palpitations.  Gastrointestinal: Negative for vomiting.  Neurological: Negative for dizziness and weakness.       Objective:   Physical Exam  Constitutional: She is oriented to person, place, and time. She appears well-developed and well-nourished.  HENT:  Head: Normocephalic and atraumatic.  Right Ear: External ear normal.  Left Ear: External ear normal.  Pulmonary/Chest: Effort normal and breath sounds normal. No respiratory distress. She exhibits no tenderness.  Neurological: She is alert and oriented to person, place, and time.  Skin: Skin is warm and dry.  Psychiatric: She has a normal mood and affect.      Results for orders placed in visit on 08/01/13  POCT RAPID STREP A (OFFICE)      Result Value Ref Range   Rapid Strep A Screen Negative  Negative       Assessment & Plan:  1. Sore throat  - POCT rapid strep A -Increase fluid intake Throat lozenges if help Proper handwashing   2. Vaginal itching - clotrimazole (LOTRIMIN) 1 % cream; Apply 1 application topically 2 (two) times daily.  Dispense: 30 g; Refill:  0 -discussed adequate hydration and proper perineal care -RTO if symptom worsen or unresolved Patient verbalized understanding Erby Pian, FNP-C

## 2013-08-20 ENCOUNTER — Telehealth: Payer: Self-pay | Admitting: Family Medicine

## 2013-08-20 NOTE — Telephone Encounter (Signed)
What is the doctor's name? Are they an ophthalmologist or optometrist? Why are you seeing an eye dr? I need a diagnosis for referral.

## 2013-08-30 ENCOUNTER — Other Ambulatory Visit: Payer: Self-pay

## 2013-08-30 NOTE — Addendum Note (Signed)
Addended byAleen Sells E on: 08/30/2013 12:18 PM   Modules accepted: Level of Service

## 2013-08-31 ENCOUNTER — Encounter: Payer: Self-pay | Admitting: Family Medicine

## 2013-09-03 ENCOUNTER — Other Ambulatory Visit: Payer: Self-pay | Admitting: *Deleted

## 2013-09-03 DIAGNOSIS — Z0100 Encounter for examination of eyes and vision without abnormal findings: Secondary | ICD-10-CM

## 2013-09-12 NOTE — Telephone Encounter (Signed)
Patient did not return call. Encounter closed.

## 2013-09-14 ENCOUNTER — Encounter: Payer: Self-pay | Admitting: *Deleted

## 2013-11-13 ENCOUNTER — Other Ambulatory Visit: Payer: Self-pay | Admitting: Family Medicine

## 2013-12-06 ENCOUNTER — Telehealth (HOSPITAL_COMMUNITY): Payer: Self-pay | Admitting: Hematology and Oncology

## 2013-12-06 ENCOUNTER — Encounter (HOSPITAL_COMMUNITY): Payer: Medicare HMO | Attending: Hematology and Oncology

## 2013-12-06 DIAGNOSIS — E039 Hypothyroidism, unspecified: Secondary | ICD-10-CM | POA: Insufficient documentation

## 2013-12-06 DIAGNOSIS — K219 Gastro-esophageal reflux disease without esophagitis: Secondary | ICD-10-CM | POA: Insufficient documentation

## 2013-12-06 DIAGNOSIS — Z79899 Other long term (current) drug therapy: Secondary | ICD-10-CM | POA: Diagnosis not present

## 2013-12-06 DIAGNOSIS — E785 Hyperlipidemia, unspecified: Secondary | ICD-10-CM | POA: Diagnosis not present

## 2013-12-06 DIAGNOSIS — R591 Generalized enlarged lymph nodes: Secondary | ICD-10-CM

## 2013-12-06 DIAGNOSIS — R599 Enlarged lymph nodes, unspecified: Secondary | ICD-10-CM | POA: Insufficient documentation

## 2013-12-06 DIAGNOSIS — I10 Essential (primary) hypertension: Secondary | ICD-10-CM | POA: Diagnosis not present

## 2013-12-06 DIAGNOSIS — E119 Type 2 diabetes mellitus without complications: Secondary | ICD-10-CM | POA: Diagnosis not present

## 2013-12-06 LAB — CBC WITH DIFFERENTIAL/PLATELET
BASOS ABS: 0.1 10*3/uL (ref 0.0–0.1)
BASOS PCT: 1 % (ref 0–1)
Eosinophils Absolute: 0.4 10*3/uL (ref 0.0–0.7)
Eosinophils Relative: 5 % (ref 0–5)
HCT: 40.9 % (ref 36.0–46.0)
Hemoglobin: 13.6 g/dL (ref 12.0–15.0)
Lymphocytes Relative: 27 % (ref 12–46)
Lymphs Abs: 2.1 10*3/uL (ref 0.7–4.0)
MCH: 30 pg (ref 26.0–34.0)
MCHC: 33.3 g/dL (ref 30.0–36.0)
MCV: 90.1 fL (ref 78.0–100.0)
Monocytes Absolute: 0.8 10*3/uL (ref 0.1–1.0)
Monocytes Relative: 10 % (ref 3–12)
NEUTROS PCT: 57 % (ref 43–77)
Neutro Abs: 4.6 10*3/uL (ref 1.7–7.7)
Platelets: 262 10*3/uL (ref 150–400)
RBC: 4.54 MIL/uL (ref 3.87–5.11)
RDW: 13.5 % (ref 11.5–15.5)
WBC: 8 10*3/uL (ref 4.0–10.5)

## 2013-12-06 LAB — COMPREHENSIVE METABOLIC PANEL
ALBUMIN: 3.4 g/dL — AB (ref 3.5–5.2)
ALK PHOS: 71 U/L (ref 39–117)
ALT: 15 U/L (ref 0–35)
AST: 20 U/L (ref 0–37)
Anion gap: 11 (ref 5–15)
BUN: 18 mg/dL (ref 6–23)
CHLORIDE: 100 meq/L (ref 96–112)
CO2: 26 mEq/L (ref 19–32)
Calcium: 9 mg/dL (ref 8.4–10.5)
Creatinine, Ser: 0.91 mg/dL (ref 0.50–1.10)
GFR calc Af Amer: 69 mL/min — ABNORMAL LOW (ref >90)
GFR calc non Af Amer: 59 mL/min — ABNORMAL LOW (ref >90)
Glucose, Bld: 138 mg/dL — ABNORMAL HIGH (ref 70–99)
Potassium: 4.7 mEq/L (ref 3.7–5.3)
Sodium: 137 mEq/L (ref 137–147)
Total Bilirubin: 0.2 mg/dL — ABNORMAL LOW (ref 0.3–1.2)
Total Protein: 6.9 g/dL (ref 6.0–8.3)

## 2013-12-06 LAB — LACTATE DEHYDROGENASE: LDH: 142 U/L (ref 94–250)

## 2013-12-06 NOTE — Progress Notes (Signed)
Labs drawn for cbcd,cmp, b23m, ldh

## 2013-12-06 NOTE — Telephone Encounter (Signed)
PC TO DR Laurance Flatten [facility] W/DEBBIE REQUESTED REF EFFT DAY. [place]

## 2013-12-07 ENCOUNTER — Other Ambulatory Visit (HOSPITAL_COMMUNITY): Payer: Medicare Other

## 2013-12-10 LAB — BETA 2 MICROGLOBULIN, SERUM: BETA 2 MICROGLOBULIN: 7.95 mg/L — AB (ref <=2.51)

## 2013-12-11 ENCOUNTER — Ambulatory Visit: Payer: Medicare Other | Admitting: Family Medicine

## 2013-12-12 ENCOUNTER — Ambulatory Visit (HOSPITAL_COMMUNITY): Payer: Medicare Other

## 2013-12-12 ENCOUNTER — Ambulatory Visit: Payer: Medicare Other | Admitting: Family Medicine

## 2013-12-13 ENCOUNTER — Telehealth (INDEPENDENT_AMBULATORY_CARE_PROVIDER_SITE_OTHER): Payer: Self-pay | Admitting: *Deleted

## 2013-12-13 ENCOUNTER — Encounter (HOSPITAL_BASED_OUTPATIENT_CLINIC_OR_DEPARTMENT_OTHER): Payer: Medicare HMO

## 2013-12-13 ENCOUNTER — Encounter (HOSPITAL_COMMUNITY): Payer: Self-pay

## 2013-12-13 VITALS — BP 134/87 | HR 68 | Temp 97.8°F | Resp 18 | Wt 178.6 lb

## 2013-12-13 DIAGNOSIS — R599 Enlarged lymph nodes, unspecified: Secondary | ICD-10-CM

## 2013-12-13 DIAGNOSIS — R591 Generalized enlarged lymph nodes: Secondary | ICD-10-CM

## 2013-12-13 NOTE — Progress Notes (Signed)
Patient will bring copy next visit

## 2013-12-13 NOTE — Patient Instructions (Signed)
[facility] Discharge Instructions  RECOMMENDATIONS MADE BY THE CONSULTANT AND ANY TEST RESULTS WILL BE SENT TO YOUR REFERRING PHYSICIAN.  EXAM FINDINGS BY THE PHYSICIAN TODAY AND SIGNS OR SYMPTOMS TO REPORT TO CLINIC OR PRIMARY PHYSICIAN: Exam and findings as discussed by Dr.Heller.  MEDICATIONS PRESCRIBED:  Continue as prescribed.  INSTRUCTIONS/FOLLOW-UP: CT scan within the next few weeks. Return to clinic in 6 months for follow up. Report any issues/concerns to clinic as needed prior to appointment.  Thank you for choosing [facility] to provide your oncology and hematology care.  To afford each patient quality time with our providers, please arrive at least 15 minutes before your scheduled appointment time.  With your help, our goal is to use those 15 minutes to complete the necessary work-up to ensure our physicians have the information they need to help with your evaluation and healthcare recommendations.    Effective January 1st, 2014, we ask that you re-schedule your appointment with our physicians should you arrive 10 or more minutes late for your appointment.  We strive to give you quality time with our providers, and arriving late affects you and other patients whose appointments are after yours.    Again, thank you for choosing [facility].  Our hope is that these requests will decrease the amount of time that you wait before being seen by our physicians.       _____________________________________________________________  Should you have questions after your visit to [facility], please contact our office at [phone] between the hours of 8:30 a.m. and 4:30 p.m.  Voicemails left after 4:30 p.m. will not be returned until the following business day.  For prescription refill requests, have your pharmacy contact our office with your prescription refill request.     _______________________________________________________________  We hope that we have given you very good care.  You may receive a patient satisfaction survey in the mail, please complete it and return it as soon as possible.  We value your feedback!  _______________________________________________________________  Have you asked about our STAR program?  STAR stands for Survivorship Training and Rehabilitation, and this is a nationally recognized cancer care program that focuses on survivorship and rehabilitation.  Cancer and cancer treatments may cause problems, such as, pain, making you feel tired and keeping you from doing the things that you need or want to do. Cancer rehabilitation can help. Our goal is to reduce these troubling effects and help you have the best quality of life possible.  You may receive a survey from a nurse that asks questions about your current state of health.  Based on the survey results, all eligible patients will be referred to the [facility] program for an evaluation so we can better serve you!  A frequently asked questions sheet is available upon request.

## 2013-12-13 NOTE — Telephone Encounter (Signed)
Dr.Rehman the patient had lab work done 12/06/13, they are in [place]. Patient is requesting that you review and let her know your findings.

## 2013-12-13 NOTE — Telephone Encounter (Signed)
Patient called in and wanted to make sure we got copy of labwork she had done recently at [facility].    Wants you to ask Dr. Laural Golden what he thinks about her results.  Can c all cell [phone] or home [phone]

## 2013-12-14 ENCOUNTER — Ambulatory Visit (INDEPENDENT_AMBULATORY_CARE_PROVIDER_SITE_OTHER): Payer: Commercial Managed Care - HMO | Admitting: Family Medicine

## 2013-12-14 ENCOUNTER — Encounter: Payer: Self-pay | Admitting: Family Medicine

## 2013-12-14 ENCOUNTER — Telehealth: Payer: Self-pay

## 2013-12-14 ENCOUNTER — Ambulatory Visit (INDEPENDENT_AMBULATORY_CARE_PROVIDER_SITE_OTHER): Payer: Commercial Managed Care - HMO

## 2013-12-14 VITALS — BP 120/76 | HR 62 | Temp 97.7°F | Ht 63.0 in | Wt 178.0 lb

## 2013-12-14 DIAGNOSIS — I10 Essential (primary) hypertension: Secondary | ICD-10-CM

## 2013-12-14 DIAGNOSIS — E039 Hypothyroidism, unspecified: Secondary | ICD-10-CM

## 2013-12-14 DIAGNOSIS — K219 Gastro-esophageal reflux disease without esophagitis: Secondary | ICD-10-CM

## 2013-12-14 DIAGNOSIS — E785 Hyperlipidemia, unspecified: Secondary | ICD-10-CM

## 2013-12-14 DIAGNOSIS — E119 Type 2 diabetes mellitus without complications: Secondary | ICD-10-CM

## 2013-12-14 NOTE — Telephone Encounter (Signed)
Message copied by Koren Bound on Fri Dec 14, 2013 12:51 PM ------      Message from: [name]      Created: Fri Dec 14, 2013 12:01 PM       As per radiology report ------

## 2013-12-14 NOTE — Progress Notes (Signed)
Subjective:    Patient ID: Lisa Sutton, female    DOB: [birth year], 80 y.o.   MRN: [id number]  HPI Pt here for follow up and management of chronic medical problems. The patient is doing well with no specific problems. She does complain of some left ear discomfort and a sense that it is stopped up. She is due to get a chest x-ray today and to receive FOBT to return. She brings in her lab work from the outside and this will be reviewed with her today. She is to schedule for a pelvic exam and Pap smear with Shelah Lewandowsky. All the outside lab work that was brought in, the triglycerides were elevated at 239. The blood sugar was elevated at 165. The creatinine was slightly elevated at 0.94. Hemoglobin A1c was elevated at 7.4%. All liver function tests were normal. The LDL C. was good at 52 and the TSH was within normal limits. Other screening labs that were brought in indicate that her BMI is 30 and this is elevated. The patient also continues to see the oncologist. He has done some additional lab work including a CBC metabolic panel and LDH. He is scheduling the patient for an MRI of her abdomen to look at her liver and pancreatic areas.        Patient Active Problem List   Diagnosis Date Noted  . Insomnia 04/12/2013  . Hyperlipidemia 04/12/2013  . Essential hypertension, benign 10/13/2012  . GERD (gastroesophageal reflux disease) 04/25/2012  . Fatty liver 08/09/2011  . Hepatitis B 08/09/2011  . Dysphagia 08/09/2011  . Type 2 diabetes mellitus 08/09/2011  . Hypothyroid 08/09/2011   Outpatient Encounter Prescriptions as of 12/14/2013  Medication Sig  . ACCU-CHEK AVIVA PLUS test strip USE AS DIRECTED TO TEST TWICE DAILY  . ADVANCED FIBER COMPLEX PO Take by mouth daily. These are gummies  . ALPRAZolam (XANAX) 0.25 MG tablet Take 1 tablet (0.25 mg total) by mouth 2 (two) times daily as needed for anxiety.  Marland Kitchen aspirin EC 81 MG tablet Take 81 mg by mouth daily.  Marland Kitchen atenolol (TENORMIN) 50 MG  tablet Take 1 tablet (50 mg total) by mouth daily.  . Cholecalciferol (VITAMIN D) 1000 UNITS capsule Take 1,000 Units by mouth daily.    . fish oil-omega-3 fatty acids 1000 MG capsule Take 1,000 mg by mouth daily.    Marland Kitchen ibuprofen (ADVIL,MOTRIN) 200 MG tablet Take 200 mg by mouth every 6 (six) hours as needed. For pain  . levothyroxine (SYNTHROID, LEVOTHROID) 112 MCG tablet Take 1 tablet (112 mcg total) by mouth daily.  Marland Kitchen lisinopril-hydrochlorothiazide (PRINZIDE,ZESTORETIC) 20-12.5 MG per tablet Take 1 tablet by mouth daily.  . metFORMIN (GLUCOPHAGE) 500 MG tablet Take 1 tablet (500 mg total) by mouth 2 (two) times daily with a meal.  . Multiple Vitamin (MULITIVITAMIN WITH MINERALS) TABS Take 1 tablet by mouth every other day.   Marland Kitchen omeprazole (PRILOSEC) 20 MG capsule Take 1 capsule (20 mg total) by mouth daily.  . rosuvastatin (CRESTOR) 5 MG tablet Take 1 tablet (5 mg total) by mouth daily.  . vitamin B-12 (CYANOCOBALAMIN) 500 MCG tablet Take 500 mcg by mouth 3 (three) times a week.   . [DISCONTINUED] Alcohol Swabs (B-D SINGLE USE SWABS REGULAR) PADS USE TWICE DAILY  . [DISCONTINUED] lisinopril-hydrochlorothiazide (PRINZIDE,ZESTORETIC) 20-12.5 MG per tablet Take 2 tablets by mouth daily.  . traZODone (DESYREL) 50 MG tablet Take 0.5-1 tablets (25-50 mg total) by mouth at bedtime as needed for sleep.    Review  of Systems  Constitutional: Negative.   HENT: Positive for ear pain (left ear feels "stopped up").   Eyes: Negative.   Respiratory: Negative.   Cardiovascular: Negative.   Gastrointestinal: Negative.   Endocrine: Negative.   Genitourinary: Negative.   Musculoskeletal: Negative.   Skin: Negative.   Allergic/Immunologic: Negative.   Neurological: Negative.   Hematological: Negative.   Psychiatric/Behavioral: Negative.        Objective:   Physical Exam  Nursing note and vitals reviewed. Constitutional: She is oriented to person, place, and time. She appears well-developed and  well-nourished. No distress.  HENT:  Head: Normocephalic and atraumatic.  Right Ear: External ear normal.  Left Ear: External ear normal.  Nose: Nose normal.  Mouth/Throat: Oropharynx is clear and moist.  Small ear canals bilaterally  Eyes: Conjunctivae and EOM are normal. Pupils are equal, round, and reactive to light. Right eye exhibits no discharge. Left eye exhibits no discharge. No scleral icterus.  Neck: Normal range of motion. Neck supple. No thyromegaly present.  No carotid bruits audible  Cardiovascular: Normal rate, regular rhythm, normal heart sounds and intact distal pulses.  Exam reveals no gallop and no friction rub.   No murmur heard. At 72 per minute  Pulmonary/Chest: Effort normal and breath sounds normal. No respiratory distress. She has no wheezes. She has no rales. She exhibits no tenderness.  The lungs were clear anteriorly and posteriorly  Abdominal: Soft. Bowel sounds are normal. She exhibits no mass. There is no tenderness. There is no rebound and no guarding.  There was no tenderness or masses in the abdomen. There were no abdominal bruits auscultated.  Musculoskeletal: Normal range of motion. She exhibits no edema and no tenderness.  Lymphadenopathy:    She has no cervical adenopathy.  Neurological: She is alert and oriented to person, place, and time. She has normal reflexes. No cranial nerve deficit.  Skin: Skin is warm and dry. No rash noted. No erythema.  She has a fairly large seborrheic keratosis over her left scapula.  Psychiatric: She has a normal mood and affect. Her behavior is normal. Judgment and thought content normal.   BP 120/76  Pulse 62  Temp(Src) 97.7 F (36.5 C) (Oral)  Ht 5\' 3"  (1.6 m)  Wt 178 lb (80.74 kg)  BMI 31.54 kg/m2  [facility] reading (PRIMARY) by  Dr. Brunilda Payor x-ray-  no active disease                                      Assessment & Plan:  1. Essential hypertension, benign - DG Chest 2 View; Future  2. Gastroesophageal  reflux disease, esophagitis presence not specified  3. Type 2 diabetes mellitus without complication  4. Hypothyroidism, unspecified hypothyroidism type  5. Hyperlipidemia - Lipid panel; Future - Hepatic function panel; Future  Meds ordered this encounter  Medications  . lisinopril-hydrochlorothiazide (PRINZIDE,ZESTORETIC) 20-12.5 MG per tablet    Sig: Take 1 tablet by mouth daily.   Patient Instructions                       Medicare Annual Wellness Visit  [facility] and the medical providers at [facility] strive to bring you the best medical care.  In doing so we not only want to address your current medical conditions and concerns but also to detect new conditions early and prevent illness, disease and health-related problems.  Medicare offers a yearly Wellness Visit which allows our clinical staff to assess your need for preventative services including immunizations, lifestyle education, counseling to decrease risk of preventable diseases and screening for fall risk and other medical concerns.    This visit is provided free of charge (no copay) for all Medicare recipients. The clinical pharmacists at [facility] have begun to conduct these Wellness Visits which will also include a thorough review of all your medications.    As you primary medical provider recommend that you make an appointment for your Annual Wellness Visit if you have not done so already this year.  You may set up this appointment before you leave today or you may call back [phone] and schedule an appointment.  Please make sure when you call that you mention that you are scheduling your Annual Wellness Visit with the clinical pharmacist so that the appointment may be made for the proper length of time.     Continue current medications. Continue good therapeutic lifestyle changes which include good diet and exercise. Fall precautions discussed with patient. If an  FOBT was given today- please return it to our front desk. If you are over 69 years old - you may need Prevnar 40 or the adult Pneumonia vaccine.  Continue followup with gastroenterology endocrinology and oncology Continue to drink plenty of liquids Continue to watch the carbohydrates in your diet Recheck lipid liver panel in about 3 months fasting, if triglycerides remain elevated consider adding fenofibrate to the Crestor Continue to check blood sugars regularly Use Flonase over-the-counter one spray each nostril daily at bedtime Also use saline nose spray during the day   Arrie Senate MD

## 2013-12-14 NOTE — Progress Notes (Signed)
[facility] OFFICE PROGRESS NOTE  PCP Redge Gainer, MD [place] [address]  DIAGNOSIS: Lymphadenopathy - Plan: aspirin EC 81 MG tablet, CT Abdomen Pelvis W Contrast  CURRENT THERAPY:  Surveillance  HEMATOLOGY/ONCOLOGY HX:  Mild lymphadenopathy in the retroperitoneum. They are stable in size compared to June 2009, up slightly compared to June 2010 . Last seen in clinic 1 year ago at which time no abnormal lymphadenopathy was mentioned. Currently asymptomatic. Being treated for an esophageal stricture.    MEDICAL HISTORY:  Past Medical History  Diagnosis Date  . Type 2 diabetes mellitus   . Essential hypertension, benign   . GERD (gastroesophageal reflux disease)   . Osteoporosis   . Obesity   . Hypothyroidism   . Dysphagia   . Hepatitis B   . Fatty liver   . Kidney stone August 2014    Patient was [facility] at [facility]    has Fatty liver; Hepatitis B; Dysphagia; Type 2 diabetes mellitus; Hypothyroid; GERD (gastroesophageal reflux disease); Essential hypertension, benign; Insomnia; and Hyperlipidemia on her problem list.    ALLERGIES:  is allergic to zetia; livalo; metformin and related; and zocor.  MEDICATIONS: has a current medication list which includes the following prescription(s): accu-chek aviva plus, fiber, aspirin ec, atenolol, vitamin d, fish oil-omega-3 fatty acids, ibuprofen, levothyroxine, metformin, multivitamin with minerals, omeprazole, rosuvastatin, vitamin b-12, alprazolam, lisinopril-hydrochlorothiazide, and trazodone.  FAMILY HISTORY: family history includes Cancer in her maternal aunt; Hypertension in her father.  REVIEW OF SYSTEMS:    SINCE YOUR LAST VISIT Been diagnosed or treated for a new medical /surgical  problem or condition: No Any Recent Xrays or studies performed: No Any new prescription or OTC medications: No ECOG Perf Status: Fully active, able to carry on all pre-disease performance  without restriction Problems sleeping: Yes Medications taken to help sleep: Yes (uses Trazodone prn or benadryl) How is your appetie: 100% normal Any Supplements: No Any trouble chewing or swallowing: No Any Nausea or Vomiting: No Any Bowel problems: No # Bowel Movements per week: 10+ Any Urinary Issues: No Any Cardiac Problems: No Any Respiratory Issues: No Any Neurological Issues: No Do you live alone: No Feelings hopelessness: No You or your family have any concerns or Health changes: No Pain Assessment Pain Score: 0-No pain  Other than that discussed above is noncontributory.    PHYSICAL EXAMINATION:   weight is 178 lb 9.6 oz (81.012 kg). Her oral temperature is 97.8 F (36.6 C). Her blood pressure is 134/87 and her pulse is 68. Her respiration is 18.    GENERAL: alert, no distress and comfortable SKIN: skin color, texture, turgor are normal, no rashes or significant lesions. No cyanosis of digits and no clubbing.  EYES: PERLA; Conjunctiva are pink and non-injected, sclera clear OROPHARYNX: no exudate, no erythema on lips, buccal mucosa, or tongue. NECK: supple, thyroid normal size, non-tender, without nodularity. No masses LYMPH:  no palpable lymphadenopathy in the cervical, axillary or inguinal LUNGS: clear to auscultation and percussion with normal breathing effort  HEART: regular rate & rhythm and no murmurs.  ABDOMEN: abdomen soft, non-tender and normal bowel sounds  MUSCULOSKELETAL/EXTREMITIES: No peripheral edem. No spine or CVA tenderness. NEURO: alert & oriented x 3 with fluent speech, no focal motor/sensory deficits   LABORATORY DATA: Lab on 12/06/2013  Component Date Value Ref Range Status  . WBC 12/06/2013 8.0  4.0 - 10.5 K/uL Final  . RBC 12/06/2013 4.54  3.87 - 5.11 MIL/uL  Final  . Hemoglobin 12/06/2013 13.6  12.0 - 15.0 g/dL Final  . HCT 12/06/2013 40.9  36.0 - 46.0 % Final  . MCV 12/06/2013 90.1  78.0 - 100.0 fL Final  . MCH 12/06/2013 30.0  26.0 -  34.0 pg Final  . MCHC 12/06/2013 33.3  30.0 - 36.0 g/dL Final  . RDW 12/06/2013 13.5  11.5 - 15.5 % Final  . Platelets 12/06/2013 262  150 - 400 K/uL Final  . Neutrophils Relative % 12/06/2013 57  43 - 77 % Final  . Neutro Abs 12/06/2013 4.6  1.7 - 7.7 K/uL Final  . Lymphocytes Relative 12/06/2013 27  12 - 46 % Final  . Lymphs Abs 12/06/2013 2.1  0.7 - 4.0 K/uL Final  . Monocytes Relative 12/06/2013 10  3 - 12 % Final  . Monocytes Absolute 12/06/2013 0.8  0.1 - 1.0 K/uL Final  . Eosinophils Relative 12/06/2013 5  0 - 5 % Final  . Eosinophils Absolute 12/06/2013 0.4  0.0 - 0.7 K/uL Final  . Basophils Relative 12/06/2013 1  0 - 1 % Final  . Basophils Absolute 12/06/2013 0.1  0.0 - 0.1 K/uL Final  . Sodium 12/06/2013 137  137 - 147 mEq/L Final  . Potassium 12/06/2013 4.7  3.7 - 5.3 mEq/L Final  . Chloride 12/06/2013 100  96 - 112 mEq/L Final  . CO2 12/06/2013 26  19 - 32 mEq/L Final  . Glucose, Bld 12/06/2013 138* 70 - 99 mg/dL Final  . BUN 12/06/2013 18  6 - 23 mg/dL Final  . Creatinine, Ser 12/06/2013 0.91  0.50 - 1.10 mg/dL Final  . Calcium 12/06/2013 9.0  8.4 - 10.5 mg/dL Final  . Total Protein 12/06/2013 6.9  6.0 - 8.3 g/dL Final  . Albumin 12/06/2013 3.4* 3.5 - 5.2 g/dL Final  . AST 12/06/2013 20  0 - 37 U/L Final  . ALT 12/06/2013 15  0 - 35 U/L Final  . Alkaline Phosphatase 12/06/2013 71  39 - 117 U/L Final  . Total Bilirubin 12/06/2013 0.2* 0.3 - 1.2 mg/dL Final  . GFR calc non Af Amer 12/06/2013 59* >90 mL/min Final  . GFR calc Af Amer 12/06/2013 69* >90 mL/min Final   Comment: (NOTE)                          The eGFR has been calculated using the CKD EPI equation.                          This calculation has not been validated in all clinical situations.                          eGFR's persistently <90 mL/min signify possible Chronic Kidney                          Disease.  . Anion gap 12/06/2013 11  5 - 15 Final  . Beta-2 Microglobulin 12/06/2013 7.95* <=2.51 mg/L Final    Performed at [facility]  . LDH 12/06/2013 142  94 - 250 U/L Final      RADIOGRAPHIC STUDIES: Dg Chest 2 View  12/14/2013   CLINICAL DATA:  Hypertension.  EXAM: CHEST  2 VIEW  COMPARISON:  Multiple exams, including 12/01/2012  FINDINGS: The heart size and mediastinal contours are within normal limits. Both lungs are clear. The  visualized skeletal structures are unremarkable.  IMPRESSION: No active cardiopulmonary disease.   Electronically Signed   By: Sherryl Barters M.D.   On: 12/14/2013 10:13    ASSESSMENT/PLAN:  Patient is clinically stable without any palpable peripheral lymphadenopathy or B symptoms. Her scan last year was negative however now she has an elevated Beta-2 microglobulin of 7.95. For completeness, I have recommended that the patient undergo a CT scan of the abdomen and pelvis. She is in agreement and will be contacted with the results so that further plans can be discussed.     The patient knows to call the clinic with any problems, questions or concerns. We can certainly see the patient much sooner if necessary.    Darrall Dears, MD 12/14/2013 7:50 PM

## 2013-12-14 NOTE — Patient Instructions (Addendum)
Medicare Annual Wellness Visit  [facility] and the medical providers at [facility] strive to bring you the best medical care.  In doing so we not only want to address your current medical conditions and concerns but also to detect new conditions early and prevent illness, disease and health-related problems.    Medicare offers a yearly Wellness Visit which allows our clinical staff to assess your need for preventative services including immunizations, lifestyle education, counseling to decrease risk of preventable diseases and screening for fall risk and other medical concerns.    This visit is provided free of charge (no copay) for all Medicare recipients. The clinical pharmacists at [facility] have begun to conduct these Wellness Visits which will also include a thorough review of all your medications.    As you primary medical provider recommend that you make an appointment for your Annual Wellness Visit if you have not done so already this year.  You may set up this appointment before you leave today or you may call back [phone]) and schedule an appointment.  Please make sure when you call that you mention that you are scheduling your Annual Wellness Visit with the clinical pharmacist so that the appointment may be made for the proper length of time.     Continue current medications. Continue good therapeutic lifestyle changes which include good diet and exercise. Fall precautions discussed with patient. If an FOBT was given today- please return it to our front desk. If you are over 98 years old - you may need Prevnar 62 or the adult Pneumonia vaccine.  Continue followup with gastroenterology endocrinology and oncology Continue to drink plenty of liquids Continue to watch the carbohydrates in your diet Recheck lipid liver panel in about 3 months fasting, if triglycerides remain elevated consider adding fenofibrate to the  Crestor Continue to check blood sugars regularly Use Flonase over-the-counter one spray each nostril daily at bedtime Also use saline nose spray during the day

## 2013-12-14 NOTE — Telephone Encounter (Signed)
Pt aware of CXR results.

## 2013-12-15 NOTE — Telephone Encounter (Signed)
CBC and LFTs are normal but beta-2 microglobulin is elevated; Patient is diabetic. This test can be elevated with an inflammatory conditions as well as lympho-proliferative disorders. Patient encouraged to proceed with test ordered by Dr. Barnet Glasgow.

## 2013-12-27 ENCOUNTER — Other Ambulatory Visit (HOSPITAL_COMMUNITY): Payer: Commercial Managed Care - HMO

## 2014-01-02 ENCOUNTER — Ambulatory Visit (HOSPITAL_COMMUNITY)
Admission: RE | Admit: 2014-01-02 | Discharge: 2014-01-02 | Disposition: A | Discharge status: Home / Self Care | Payer: Medicare HMO | Source: Ambulatory Visit | Attending: Oncology | Admitting: Oncology

## 2014-01-02 DIAGNOSIS — R599 Enlarged lymph nodes, unspecified: Secondary | ICD-10-CM | POA: Insufficient documentation

## 2014-01-02 DIAGNOSIS — Q619 Cystic kidney disease, unspecified: Secondary | ICD-10-CM | POA: Insufficient documentation

## 2014-01-02 DIAGNOSIS — R591 Generalized enlarged lymph nodes: Secondary | ICD-10-CM

## 2014-01-02 DIAGNOSIS — K5730 Diverticulosis of large intestine without perforation or abscess without bleeding: Secondary | ICD-10-CM | POA: Insufficient documentation

## 2014-01-02 MED ORDER — IOHEXOL 300 MG/ML  SOLN
100.0000 mL | Freq: Once | INTRAMUSCULAR | Status: AC | PRN
  Administered 2014-01-02: 100 mL via INTRAVENOUS

## 2014-01-02 MED ORDER — SODIUM CHLORIDE 0.9 % IJ SOLN
INTRAMUSCULAR | Status: AC
  Filled 2014-01-02: qty 500

## 2014-01-02 MED ORDER — SODIUM CHLORIDE 0.9 % IJ SOLN
INTRAMUSCULAR | Status: AC
  Filled 2014-01-02: qty 36

## 2014-01-07 ENCOUNTER — Telehealth: Payer: Self-pay | Admitting: Family Medicine

## 2014-01-07 ENCOUNTER — Encounter: Payer: Self-pay | Admitting: Family Medicine

## 2014-01-07 NOTE — Telephone Encounter (Signed)
Dr. Laurance Flatten is this ok to do

## 2014-01-08 ENCOUNTER — Other Ambulatory Visit: Payer: Self-pay | Admitting: *Deleted

## 2014-01-08 ENCOUNTER — Encounter: Payer: Self-pay | Admitting: Family Medicine

## 2014-01-08 DIAGNOSIS — M79643 Pain in unspecified hand: Secondary | ICD-10-CM

## 2014-01-08 NOTE — Telephone Encounter (Signed)
Referrals made to Dr's Hinda Glatter and Redmond Pulling;  All approved except suspension for Dr. Redmond Pulling

## 2014-01-11 NOTE — Telephone Encounter (Signed)
Referral made on 8/18

## 2014-02-04 ENCOUNTER — Encounter (INDEPENDENT_AMBULATORY_CARE_PROVIDER_SITE_OTHER): Payer: Self-pay | Admitting: Internal Medicine

## 2014-02-04 ENCOUNTER — Ambulatory Visit (INDEPENDENT_AMBULATORY_CARE_PROVIDER_SITE_OTHER): Payer: Commercial Managed Care - HMO | Admitting: Internal Medicine

## 2014-02-04 VITALS — BP 110/80 | HR 72 | Temp 97.0°F | Resp 18 | Ht 63.0 in | Wt 185.0 lb

## 2014-02-04 DIAGNOSIS — B181 Chronic viral hepatitis B without delta-agent: Secondary | ICD-10-CM

## 2014-02-04 DIAGNOSIS — K219 Gastro-esophageal reflux disease without esophagitis: Secondary | ICD-10-CM

## 2014-02-04 MED ORDER — OMEPRAZOLE 20 MG PO CPDR: 20.0000 mg | DELAYED_RELEASE_CAPSULE | Freq: Every day | ORAL | Status: DC

## 2014-02-04 NOTE — Patient Instructions (Signed)
Call if Prilosec or omeprazole stops working

## 2014-02-04 NOTE — Progress Notes (Signed)
Presenting complaint;  History of positive hepatitis B surface antigen. Followup for GERD.  Data base;  Patient is 80 year old Caucasian female who presents for scheduled visit. She has history of any portal lymphadenopathy which has remained stable over the last 5 years. When this diagnosis was made by dr. Tressie Stalker she also had viral markers. Hepatitis B surface antigen was positive and hepatitis C. antibody was nonreactive. She was subsequently seen by me while I was in [facility]. HBV DNA was undetectable and her transaminases were normal. She did give history of having extract hepatitis when she was 57 or 80 years old. She was therefore felt to be an inactive carrier or no therapy was recommended. She was also found to have fatty liver on imaging studies. She developed acute bump in her transaminases in November 2009 with AST and ALT of greater than 400. She was also found to be diabetic and transaminases quickly returned to normal with treatment of diabetes mellitus and transaminases have remained normal since then. She also has chronic GERD complicated by distal esophageal stricture she was last dilated in March 2013 with [facility] dilator and she was also found to have esophageal web. Prior dilations were in 2005 at 2010. She had single colonic adenomas removed in 2000. She had colonoscopy in 2005 with removal of one polyp which was non-adenomatous. Last colonoscopy was in October 2010 revealing left-sided diverticulosis and hemorrhoids but no evidence of polyps.  Subjective;  Patient feels fine. She states she had blood work few months back and hepatitis B surface antigen was negative and she wants to know what it means. She had blood work in July 2015 and beta-2 microglobulin was elevated and she therefore had abdominopelvic CT revealing stable periportal lymphadenopathy and fatty liver. She states heartburn is well controlled and she has no difficulty swallowing. She needs new  prescription for omeprazole. She says lately she has not had any energy and has not been able to play golf because of left wrist problems and she was diagnosed with carpal tunnel syndrome and scheduled to undergo surgery in near future. She has gained 9 pounds in the last 9 months. Her bowels move daily and she denies melena or rectal bleeding.   Current Medications: Outpatient Encounter Prescriptions as of 02/04/2014  Medication Sig  . ACCU-CHEK AVIVA PLUS test strip USE AS DIRECTED TO TEST TWICE DAILY  . ADVANCED FIBER COMPLEX PO Take by mouth daily. These are gummies  . ALPRAZolam (XANAX) 0.25 MG tablet Take 1 tablet (0.25 mg total) by mouth 2 (two) times daily as needed for anxiety.  Marland Kitchen aspirin EC 81 MG tablet Take 81 mg by mouth daily.  Marland Kitchen atenolol (TENORMIN) 50 MG tablet Take 1 tablet (50 mg total) by mouth daily.  . Cholecalciferol (VITAMIN D) 1000 UNITS capsule Take 1,000 Units by mouth daily.    . fish oil-omega-3 fatty acids 1000 MG capsule Take 1,000 mg by mouth daily.    Marland Kitchen ibuprofen (ADVIL,MOTRIN) 200 MG tablet Take 200 mg by mouth every 6 (six) hours as needed. For pain  . levothyroxine (SYNTHROID, LEVOTHROID) 112 MCG tablet Take 1 tablet (112 mcg total) by mouth daily.  Marland Kitchen lisinopril-hydrochlorothiazide (PRINZIDE,ZESTORETIC) 20-12.5 MG per tablet Take 1 tablet by mouth daily.  . metFORMIN (GLUCOPHAGE) 500 MG tablet Take 1 tablet (500 mg total) by mouth 2 (two) times daily with a meal.  . Multiple Vitamin (MULITIVITAMIN WITH MINERALS) TABS Take 1 tablet by mouth every other day.   Marland Kitchen omeprazole (PRILOSEC) 20  MG capsule Take 1 capsule (20 mg total) by mouth daily.  . rosuvastatin (CRESTOR) 5 MG tablet Take 1 tablet (5 mg total) by mouth daily.  . traZODone (DESYREL) 50 MG tablet Take 0.5-1 tablets (25-50 mg total) by mouth at bedtime as needed for sleep.  . vitamin B-12 (CYANOCOBALAMIN) 500 MCG tablet Take 500 mcg by mouth 3 (three) times a week.      Objective: Blood pressure  110/80, pulse 72, temperature 97 F (36.1 C), temperature source Oral, resp. rate 18, height 5\' 3"  (1.6 m), weight 185 lb (83.915 kg). Patient is alert and in no acute distress. Conjunctiva is pink. Sclera is nonicteric Oropharyngeal mucosa is normal. No neck masses or thyromegaly noted. Cardiac exam with regular rhythm normal S1 and S2. No murmur or gallop noted. Lungs are clear to auscultation. Abdomen is full but soft and nontender without organomegaly or masses. No LE edema or clubbing noted.  Labs/studies Results: Abdominopelvic CT from 01/02/2014 reviewed. It reveals fatty liver but no evidence of splenomegaly or changes of cirrhosis. Hepatitis B surface antigen was negative in December 2013.  LFTs from 12/06/2013. Bilirubin 0.2, AP 71, AST 20, ALT 15, total protein 6.9 and albumin 3.4.    Assessment:  #1. History of hepatitis B and inactive carrier stage diagnosed back in June 2009 but HBV DNA was negative. She did have followup studies in 2010 but these are not available. Hepatitis B surface antigen was negative in December 2013 which means she may have cleared the virus spontaneously. It remains to be seen if she has developed B. surface antibody which would imply immunity. #2. GERD. She has history of erosive reflux esophagitis complicated by esophageal stricture last dilated in [place] 2013 and she is doing well with therapy. #3. Fatty liver. Transaminases are normal and no CT evidence of cirrhosis.   Plan:  Patient reassured. Will try to obtain results of lab studies that she had in 2010 in 2011 before further tests were ordered. New prescription for omeprazole given for 90 days with 3 refills. Office visit in one year.

## 2014-02-06 ENCOUNTER — Telehealth (INDEPENDENT_AMBULATORY_CARE_PROVIDER_SITE_OTHER): Payer: Self-pay | Admitting: *Deleted

## 2014-02-06 DIAGNOSIS — B181 Chronic viral hepatitis B without delta-agent: Secondary | ICD-10-CM

## 2014-02-06 NOTE — Telephone Encounter (Signed)
Please note that the correct test has ben ordered. Hepatitis B Antibody. I have call Customer Service , they have done a sheet and sent it to the lab with this correction per Levada Dy. She states that due to the specimen being in route this is the way that this will need to be handled. Every thing in Epic will be corrected at this time.

## 2014-02-06 NOTE — Telephone Encounter (Signed)
Per Dr.Rehman patient will nee to have this lab.

## 2014-02-07 LAB — HEPATITIS B SURFACE ANTIBODY,QUALITATIVE: Hep B S Ab: NEGATIVE

## 2014-02-11 ENCOUNTER — Ambulatory Visit (INDEPENDENT_AMBULATORY_CARE_PROVIDER_SITE_OTHER): Payer: Commercial Managed Care - HMO | Admitting: Family

## 2014-02-11 VITALS — BP 129/72 | HR 71 | Temp 97.2°F | Ht 63.0 in | Wt 186.6 lb

## 2014-02-11 DIAGNOSIS — R609 Edema, unspecified: Secondary | ICD-10-CM

## 2014-02-11 DIAGNOSIS — R635 Abnormal weight gain: Secondary | ICD-10-CM

## 2014-02-11 DIAGNOSIS — R600 Localized edema: Secondary | ICD-10-CM

## 2014-02-11 MED ORDER — FUROSEMIDE 20 MG PO TABS: 20.0000 mg | ORAL_TABLET | Freq: Every day | ORAL | Status: DC

## 2014-02-11 NOTE — Progress Notes (Signed)
   Subjective:    Patient ID: Lisa Sutton, female    DOB: [birth year], 80 y.o.   MRN: [id number]  HPI Pt presents to the office for bilateral leg/feet edema that she noticed about two weeks ago. Pt states she has gained 12 lb over the last 3 weeks. Pt states she has not changed her eating habits. Pt denies any redness or pain.    Review of Systems  Constitutional: Negative.   HENT: Negative.   Eyes: Negative.   Respiratory: Negative.  Negative for shortness of breath.   Cardiovascular: Positive for leg swelling. Negative for palpitations.  Gastrointestinal: Negative.   Endocrine: Negative.   Genitourinary: Negative.   Musculoskeletal: Negative.   Neurological: Negative.  Negative for headaches.  Hematological: Negative.   Psychiatric/Behavioral: Negative.   All other systems reviewed and are negative.      Objective:   Physical Exam  Vitals reviewed. Constitutional: She is oriented to person, place, and time. She appears well-developed and well-nourished. No distress.  Eyes: Pupils are equal, round, and reactive to light.  Neck: Normal range of motion. Neck supple. No thyromegaly present.  Cardiovascular: Normal rate, regular rhythm, normal heart sounds and intact distal pulses.   No murmur heard. Pulmonary/Chest: Effort normal and breath sounds normal. No respiratory distress. She has no wheezes.  Abdominal: Soft. Bowel sounds are normal. She exhibits no distension. There is no tenderness.  Musculoskeletal: Normal range of motion. She exhibits edema. She exhibits no tenderness.  2+ edema in bilateral lower legs, ankles, feet  Neurological: She is alert and oriented to person, place, and time. She has normal reflexes. No cranial nerve deficit.  Skin: Skin is warm and dry.  Psychiatric: She has a normal mood and affect. Her behavior is normal. Judgment and thought content normal.    BP 129/72  Pulse 71  Temp(Src) 97.2 F (36.2 C) (Oral)  Ht _0  (1.6 m)  Wt 186 lb  9.6 oz (84.641 kg)  BMI 33.06 kg/m2       Assessment & Plan:  1. Bilateral leg edema -Keep feet elevated when possible - furosemide (LASIX) 20 MG tablet; Take 1 tablet (20 mg total) by mouth daily.  Dispense: 30 tablet; Refill: 3 - CMP14+EGFR -BNP  2. Weight gain -Keep daily weights -Report 3 lbs in one day or 5lb or more in one week - furosemide (LASIX) 20 MG tablet; Take 1 tablet (20 mg total) by mouth daily.  Dispense: 30 tablet; Refill: 3 - CMP14+EGFR - BNP  Evelina Dun, FNP

## 2014-02-11 NOTE — Patient Instructions (Addendum)

## 2014-02-12 LAB — CMP14+EGFR
ALBUMIN: 3.7 g/dL (ref 3.5–4.8)
ALK PHOS: 69 IU/L (ref 39–117)
ALT: 10 IU/L (ref 0–32)
AST: 17 IU/L (ref 0–40)
Albumin/Globulin Ratio: 1.6 (ref 1.1–2.5)
BUN / CREAT RATIO: 24 (ref 11–26)
BUN: 24 mg/dL (ref 8–27)
CHLORIDE: 99 mmol/L (ref 97–108)
CO2: 23 mmol/L (ref 18–29)
CREATININE: 0.99 mg/dL (ref 0.57–1.00)
Calcium: 9.4 mg/dL (ref 8.7–10.3)
GFR calc Af Amer: 63 mL/min/{1.73_m2} (ref >59)
GFR calc non Af Amer: 55 mL/min/{1.73_m2} — ABNORMAL LOW (ref >59)
GLOBULIN, TOTAL: 2.3 g/dL (ref 1.5–4.5)
Glucose: 167 mg/dL — ABNORMAL HIGH (ref 65–99)
Potassium: 4.7 mmol/L (ref 3.5–5.2)
SODIUM: 138 mmol/L (ref 134–144)
Total Bilirubin: 0.2 mg/dL (ref 0.0–1.2)
Total Protein: 6 g/dL (ref 6.0–8.5)

## 2014-02-12 LAB — BRAIN NATRIURETIC PEPTIDE: BNP: 26.4 pg/mL (ref 0.0–100.0)

## 2014-02-20 ENCOUNTER — Encounter: Payer: Self-pay | Admitting: Family

## 2014-02-20 ENCOUNTER — Ambulatory Visit (INDEPENDENT_AMBULATORY_CARE_PROVIDER_SITE_OTHER): Payer: Commercial Managed Care - HMO | Admitting: Family

## 2014-02-20 VITALS — BP 118/71 | HR 65 | Temp 97.7°F | Ht 63.0 in | Wt 183.2 lb

## 2014-02-20 DIAGNOSIS — Z23 Encounter for immunization: Secondary | ICD-10-CM

## 2014-02-20 DIAGNOSIS — R609 Edema, unspecified: Secondary | ICD-10-CM

## 2014-02-20 NOTE — Progress Notes (Signed)
   Subjective:    Patient ID: Lisa Sutton, female    DOB: [birth year], 80 y.o.   MRN: [id number]  HPI Pt presents to the office for follow-up for bilateral leg swelling and 12 lb weight gain. Pt was started on lasix 20 mg daily. Pt just had carpal tunnel surgery on Monday and pt states she has not taken her lasix since then, because she has to "pee so much" when she does. She states her feet are much better and she has lost 4 lbs since starting lasix.    Review of Systems  Constitutional: Negative.   HENT: Negative.   Eyes: Negative.   Respiratory: Negative.  Negative for shortness of breath.   Cardiovascular: Negative.  Negative for palpitations.  Gastrointestinal: Negative.   Endocrine: Negative.   Genitourinary: Negative.   Musculoskeletal: Negative.   Neurological: Negative.  Negative for headaches.  Hematological: Negative.   Psychiatric/Behavioral: Negative.   All other systems reviewed and are negative.      Objective:   Physical Exam  Vitals reviewed. Constitutional: She is oriented to person, place, and time. She appears well-developed and well-nourished. No distress.  Eyes: Pupils are equal, round, and reactive to light.  Neck: Normal range of motion. Neck supple. No thyromegaly present.  Cardiovascular: Normal rate, regular rhythm, normal heart sounds and intact distal pulses.   No murmur heard. Pulmonary/Chest: Effort normal and breath sounds normal. No respiratory distress. She has no wheezes.  Abdominal: Soft. Bowel sounds are normal. She exhibits no distension. There is no tenderness.  Musculoskeletal: Normal range of motion. She exhibits edema (Trace amt in left foot). She exhibits no tenderness.  Neurological: She is alert and oriented to person, place, and time. She has normal reflexes. No cranial nerve deficit.  Skin: Skin is warm and dry.  Psychiatric: She has a normal mood and affect. Her behavior is normal. Judgment and thought content normal.    BP  118/71  Pulse 65  Temp(Src) 97.7 F (36.5 C) (Oral)  Ht 5\' 3"  (1.6 m)  Wt 183 lb 3.2 oz (83.099 kg)  BMI 32.46 kg/m2       Assessment & Plan:  1. Peripheral edema -Continue medications -Pt needs to report a 3 lb weight gain in one day or a 5lb or greater weight gain in one week -RTO prn  Evelina Dun, FNP

## 2014-02-20 NOTE — Patient Instructions (Signed)
Peripheral Edema °You have swelling in your legs (peripheral edema). This swelling is due to excess accumulation of salt and water in your body. Edema may be a sign of heart, kidney or liver disease, or a side effect of a medication. It may also be due to problems in the leg veins. Elevating your legs and using special support stockings may be very helpful, if the cause of the swelling is due to poor venous circulation. Avoid long periods of standing, whatever the cause. °Treatment of edema depends on identifying the cause. Chips, pretzels, pickles and other salty foods should be avoided. Restricting salt in your diet is almost always needed. Water pills (diuretics) are often used to remove the excess salt and water from your body via urine. These medicines prevent the kidney from reabsorbing sodium. This increases urine flow. °Diuretic treatment may also result in lowering of potassium levels in your body. Potassium supplements may be needed if you have to use diuretics daily. Daily weights can help you keep track of your progress in clearing your edema. You should call your caregiver for follow up care as recommended. °SEEK IMMEDIATE MEDICAL CARE IF:  °· You have increased swelling, pain, redness, or heat in your legs. °· You develop shortness of breath, especially when lying down. °· You develop chest or abdominal pain, weakness, or fainting. °· You have a fever. °Document Released: 06/17/2004 Document Revised: 08/02/2011 Document Reviewed: 05/28/2009 °ExitCare® Patient Information ©2015 [employer]. This information is not intended to replace advice given to you by your health care provider. Make sure you discuss any questions you have with your health care provider. ° °

## 2014-03-11 ENCOUNTER — Other Ambulatory Visit (INDEPENDENT_AMBULATORY_CARE_PROVIDER_SITE_OTHER): Payer: Commercial Managed Care - HMO

## 2014-03-11 DIAGNOSIS — I10 Essential (primary) hypertension: Secondary | ICD-10-CM

## 2014-03-11 DIAGNOSIS — E039 Hypothyroidism, unspecified: Secondary | ICD-10-CM

## 2014-03-11 DIAGNOSIS — E559 Vitamin D deficiency, unspecified: Secondary | ICD-10-CM

## 2014-03-11 DIAGNOSIS — E119 Type 2 diabetes mellitus without complications: Secondary | ICD-10-CM

## 2014-03-11 DIAGNOSIS — E785 Hyperlipidemia, unspecified: Secondary | ICD-10-CM

## 2014-03-11 LAB — POCT CBC
Granulocyte percent: 65 %G (ref 37–80)
HEMATOCRIT: 39.6 % (ref 37.7–47.9)
HEMOGLOBIN: 12.7 g/dL (ref 12.2–16.2)
Lymph, poc: 2.6 (ref 0.6–3.4)
MCH: 28.8 pg (ref 27–31.2)
MCHC: 32.2 g/dL (ref 31.8–35.4)
MCV: 89.6 fL (ref 80–97)
MPV: 6.4 fL (ref 0–99.8)
POC Granulocyte: 6.3 (ref 2–6.9)
POC LYMPH PERCENT: 26.5 %L (ref 10–50)
Platelet Count, POC: 318 10*3/uL (ref 142–424)
RBC: 4.4 M/uL (ref 4.04–5.48)
RDW, POC: 13.9 %
WBC: 9.7 10*3/uL (ref 4.6–10.2)

## 2014-03-11 LAB — POCT GLYCOSYLATED HEMOGLOBIN (HGB A1C): Hemoglobin A1C: 6.4

## 2014-03-11 NOTE — Progress Notes (Signed)
Lab only 

## 2014-03-12 ENCOUNTER — Telehealth: Payer: Self-pay | Admitting: Family Medicine

## 2014-03-12 ENCOUNTER — Ambulatory Visit (INDEPENDENT_AMBULATORY_CARE_PROVIDER_SITE_OTHER): Payer: Commercial Managed Care - HMO | Admitting: Nurse Practitioner

## 2014-03-12 ENCOUNTER — Encounter: Payer: Self-pay | Admitting: Nurse Practitioner

## 2014-03-12 VITALS — BP 116/68 | HR 75 | Temp 97.4°F | Ht 63.0 in | Wt 182.4 lb

## 2014-03-12 DIAGNOSIS — Z01419 Encounter for gynecological examination (general) (routine) without abnormal findings: Secondary | ICD-10-CM

## 2014-03-12 LAB — BMP8+EGFR
BUN / CREAT RATIO: 22 (ref 11–26)
BUN: 28 mg/dL — AB (ref 8–27)
CO2: 24 mmol/L (ref 18–29)
CREATININE: 1.29 mg/dL — AB (ref 0.57–1.00)
Calcium: 10 mg/dL (ref 8.7–10.3)
Chloride: 98 mmol/L (ref 97–108)
GFR calc Af Amer: 46 mL/min/{1.73_m2} — ABNORMAL LOW (ref >59)
GFR, EST NON AFRICAN AMERICAN: 40 mL/min/{1.73_m2} — AB (ref >59)
Glucose: 144 mg/dL — ABNORMAL HIGH (ref 65–99)
Potassium: 4.6 mmol/L (ref 3.5–5.2)
Sodium: 137 mmol/L (ref 134–144)

## 2014-03-12 LAB — LIPID PANEL
Chol/HDL Ratio: 3.6 ratio units (ref 0.0–4.4)
Cholesterol, Total: 142 mg/dL (ref 100–199)
HDL: 40 mg/dL (ref >39)
LDL Calculated: 50 mg/dL (ref 0–99)
TRIGLYCERIDES: 261 mg/dL — AB (ref 0–149)
VLDL Cholesterol Cal: 52 mg/dL — ABNORMAL HIGH (ref 5–40)

## 2014-03-12 LAB — HEPATIC FUNCTION PANEL
ALBUMIN: 3.6 g/dL (ref 3.5–4.8)
ALK PHOS: 60 IU/L (ref 39–117)
ALT: 11 IU/L (ref 0–32)
AST: 19 IU/L (ref 0–40)
BILIRUBIN DIRECT: 0.09 mg/dL (ref 0.00–0.40)
BILIRUBIN TOTAL: 0.2 mg/dL (ref 0.0–1.2)
Total Protein: 5.8 g/dL — ABNORMAL LOW (ref 6.0–8.5)

## 2014-03-12 LAB — POCT UA - MICROSCOPIC ONLY
Casts, Ur, LPF, POC: NEGATIVE
Crystals, Ur, HPF, POC: NEGATIVE
MUCUS UA: NEGATIVE
Yeast, UA: NEGATIVE

## 2014-03-12 LAB — THYROID PANEL WITH TSH
FREE THYROXINE INDEX: 2.9 (ref 1.2–4.9)
T3 Uptake Ratio: 33 % (ref 24–39)
T4, Total: 8.7 ug/dL (ref 4.5–12.0)
TSH: 8.49 u[IU]/mL — ABNORMAL HIGH (ref 0.450–4.500)

## 2014-03-12 LAB — POCT URINALYSIS DIPSTICK
BILIRUBIN UA: NEGATIVE
GLUCOSE UA: NEGATIVE
KETONES UA: NEGATIVE
Nitrite, UA: NEGATIVE
RBC UA: NEGATIVE
SPEC GRAV UA: 1.015
Urobilinogen, UA: NEGATIVE
pH, UA: 6

## 2014-03-12 LAB — VITAMIN D 25 HYDROXY (VIT D DEFICIENCY, FRACTURES): Vit D, 25-Hydroxy: 23.4 ng/mL — ABNORMAL LOW (ref 30.0–100.0)

## 2014-03-12 MED ORDER — LEVOTHYROXINE SODIUM 25 MCG PO TABS: 25.0000 ug | ORAL_TABLET | Freq: Every day | ORAL | Status: DC

## 2014-03-12 NOTE — Patient Instructions (Signed)

## 2014-03-12 NOTE — Addendum Note (Signed)
Addended by: Zannie Cove on: 03/12/2014 03:45 PM   Modules accepted: Orders

## 2014-03-12 NOTE — Progress Notes (Signed)
   Subjective:    Patient ID: Lisa Sutton, female    DOB: [birth year], 80 y.o.   MRN: [id number]  HPI Regular patient of Dr. Laurance Flatten that is here today for pap and breast exam only- SHe is doing well today without complaints.    Review of Systems  Constitutional: Negative.   HENT: Negative.   Respiratory: Negative.   Cardiovascular: Negative.   Genitourinary: Negative.   Neurological: Negative.   Psychiatric/Behavioral: Negative.   All other systems reviewed and are negative.      Objective:   Physical Exam  Constitutional: She is oriented to person, place, and time. She appears well-developed and well-nourished.  HENT:  Head: Normocephalic.  Right Ear: Hearing, tympanic membrane, external ear and ear canal normal.  Left Ear: Hearing, tympanic membrane, external ear and ear canal normal.  Nose: Nose normal.  Mouth/Throat: Uvula is midline and oropharynx is clear and moist.  Eyes: Conjunctivae and EOM are normal. Pupils are equal, round, and reactive to light.  Neck: Normal range of motion and full passive range of motion without pain. Neck supple. No JVD present. Carotid bruit is not present. No mass and no thyromegaly present.  Cardiovascular: Normal rate, normal heart sounds and intact distal pulses.   No murmur heard. Pulmonary/Chest: Effort normal and breath sounds normal. Right breast exhibits no inverted nipple, no mass, no nipple discharge, no skin change and no tenderness. Left breast exhibits no inverted nipple, no mass, no nipple discharge, no skin change and no tenderness.  Abdominal: Soft. Bowel sounds are normal. She exhibits no mass. There is no tenderness.  Genitourinary: Vagina normal and uterus normal. No breast swelling, tenderness, discharge or bleeding.  bimanual exam-No adnexal masses or tenderness. Vaginal cuff intact  Musculoskeletal: Normal range of motion.  Lymphadenopathy:    She has no cervical adenopathy.  Neurological: She is alert and oriented  to person, place, and time.  Skin: Skin is warm and dry.  Psychiatric: She has a normal mood and affect. Her behavior is normal. Judgment and thought content normal.   BP 116/68  Pulse 75  Temp(Src) 97.4 F (36.3 C) (Oral)  Ht 5\' 3"  (1.6 m)  Wt 182 lb 6.4 oz (82.736 kg)  BMI 32.32 kg/m2         Assessment & Plan:   1. Encounter for routine gynecological examination    Keep follow  Up appoiuntment with Dr. Laurance Flatten Will cal with labs result- labs were done yesterday  Mary-Margaret Hassell Done, FNP

## 2014-03-12 NOTE — Telephone Encounter (Signed)
Message copied by Waverly Ferrari on Tue Mar 12, 2014  2:32 PM ------      Message from: [name]      Created: Tue Mar 12, 2014  1:05 PM       It would be better just to add a very small amount of thyroid medicine to the current medication      Add 25 mcg, one half on Monday Wednesday and Friday only along with the current medication      Make sure that she understands this.      You can call in new prescription in but will be added to the current treatment      She should have a thyroid profile repeated in 4-6 week ------

## 2014-03-13 LAB — PAP IG (IMAGE GUIDED): PAP SMEAR COMMENT: 0

## 2014-04-29 ENCOUNTER — Other Ambulatory Visit (INDEPENDENT_AMBULATORY_CARE_PROVIDER_SITE_OTHER): Payer: Commercial Managed Care - HMO

## 2014-04-29 DIAGNOSIS — E039 Hypothyroidism, unspecified: Secondary | ICD-10-CM

## 2014-04-30 LAB — THYROID PANEL WITH TSH
Free Thyroxine Index: 2.4 (ref 1.2–4.9)
T3 Uptake Ratio: 33 % (ref 24–39)
T4, Total: 7.4 ug/dL (ref 4.5–12.0)
TSH: 9.31 u[IU]/mL — ABNORMAL HIGH (ref 0.450–4.500)

## 2014-05-01 ENCOUNTER — Telehealth: Payer: Self-pay

## 2014-05-01 NOTE — Telephone Encounter (Signed)
-----   Message from [name], MD sent at 04/30/2014  7:01 AM EST ----- The TSH is elevated and this means that she needs more thyroid medication. She is seeing the endocrinologist. Please forward a copy of this report to him to help adjust her medication. Also please give the patient a copy of this report if she needs it

## 2014-05-03 ENCOUNTER — Telehealth: Payer: Self-pay | Admitting: Family Medicine

## 2014-05-03 NOTE — Telephone Encounter (Signed)
The patient should continue to take 112 g daily, but she should increase her 25 g to one half tablet daily Monday through Friday and not just Monday Wednesday and Friday Recheck thyroid profile in 6 weeks

## 2014-05-03 NOTE — Telephone Encounter (Signed)
112 mcg QD And  12.5 mcg - M, W, F --- (this is a half of a 25 mcg tab)   We need to increase amt per your note- please address and i will call the pt

## 2014-05-06 NOTE — Telephone Encounter (Signed)
Pt aware.

## 2014-05-11 ENCOUNTER — Other Ambulatory Visit: Payer: Self-pay | Admitting: Family Medicine

## 2014-05-31 ENCOUNTER — Encounter: Payer: Self-pay | Admitting: Family Medicine

## 2014-05-31 ENCOUNTER — Ambulatory Visit (INDEPENDENT_AMBULATORY_CARE_PROVIDER_SITE_OTHER): Payer: Medicare Other | Admitting: Family Medicine

## 2014-05-31 VITALS — BP 128/71 | HR 65 | Temp 97.2°F | Ht 63.0 in | Wt 179.0 lb

## 2014-05-31 DIAGNOSIS — R591 Generalized enlarged lymph nodes: Secondary | ICD-10-CM

## 2014-05-31 DIAGNOSIS — E119 Type 2 diabetes mellitus without complications: Secondary | ICD-10-CM

## 2014-05-31 DIAGNOSIS — G4700 Insomnia, unspecified: Secondary | ICD-10-CM

## 2014-05-31 DIAGNOSIS — E785 Hyperlipidemia, unspecified: Secondary | ICD-10-CM | POA: Diagnosis not present

## 2014-05-31 DIAGNOSIS — I10 Essential (primary) hypertension: Secondary | ICD-10-CM

## 2014-05-31 DIAGNOSIS — R635 Abnormal weight gain: Secondary | ICD-10-CM

## 2014-05-31 DIAGNOSIS — E559 Vitamin D deficiency, unspecified: Secondary | ICD-10-CM

## 2014-05-31 DIAGNOSIS — R600 Localized edema: Secondary | ICD-10-CM

## 2014-05-31 DIAGNOSIS — E039 Hypothyroidism, unspecified: Secondary | ICD-10-CM

## 2014-05-31 DIAGNOSIS — K5901 Slow transit constipation: Secondary | ICD-10-CM

## 2014-05-31 DIAGNOSIS — K219 Gastro-esophageal reflux disease without esophagitis: Secondary | ICD-10-CM

## 2014-05-31 LAB — POCT UA - MICROALBUMIN: Microalbumin Ur, POC: NEGATIVE mg/L

## 2014-05-31 MED ORDER — OMEPRAZOLE 20 MG PO CPDR: 20.0000 mg | DELAYED_RELEASE_CAPSULE | Freq: Every day | ORAL | Status: DC

## 2014-05-31 MED ORDER — METFORMIN HCL 500 MG PO TABS: ORAL_TABLET | ORAL | Status: DC

## 2014-05-31 MED ORDER — HYDROCORTISONE 1 % EX CREA
1.0000 | TOPICAL_CREAM | Freq: Two times a day (BID) | CUTANEOUS | Status: DC
Start: 2014-05-31 — End: 2015-03-04

## 2014-05-31 MED ORDER — ALPRAZOLAM 0.25 MG PO TABS: 0.2500 mg | ORAL_TABLET | Freq: Two times a day (BID) | ORAL | Status: DC | PRN

## 2014-05-31 MED ORDER — ATENOLOL 50 MG PO TABS: 50.0000 mg | ORAL_TABLET | Freq: Every day | ORAL | Status: DC

## 2014-05-31 MED ORDER — ROSUVASTATIN CALCIUM 10 MG PO TABS: 10.0000 mg | ORAL_TABLET | Freq: Every day | ORAL | Status: DC

## 2014-05-31 MED ORDER — LISINOPRIL-HYDROCHLOROTHIAZIDE 20-12.5 MG PO TABS: 1.0000 | ORAL_TABLET | Freq: Every day | ORAL | Status: DC

## 2014-05-31 MED ORDER — LEVOTHYROXINE SODIUM 25 MCG PO TABS: 25.0000 ug | ORAL_TABLET | Freq: Every day | ORAL | Status: DC

## 2014-05-31 MED ORDER — FUROSEMIDE 20 MG PO TABS: 20.0000 mg | ORAL_TABLET | Freq: Every day | ORAL | Status: DC

## 2014-05-31 MED ORDER — LEVOTHYROXINE SODIUM 112 MCG PO TABS: 112.0000 ug | ORAL_TABLET | Freq: Every day | ORAL | Status: DC

## 2014-05-31 NOTE — Patient Instructions (Addendum)
Medicare Annual Wellness Visit  [facility] and the medical providers at [facility] strive to bring you the best medical care.  In doing so we not only want to address your current medical conditions and concerns but also to detect new conditions early and prevent illness, disease and health-related problems.    Medicare offers a yearly Wellness Visit which allows our clinical staff to assess your need for preventative services including immunizations, lifestyle education, counseling to decrease risk of preventable diseases and screening for fall risk and other medical concerns.    This visit is provided free of charge (no copay) for all Medicare recipients. The clinical pharmacists at [facility] have begun to conduct these Wellness Visits which will also include a thorough review of all your medications.    As you primary medical provider recommend that you make an appointment for your Annual Wellness Visit if you have not done so already this year.  You may set up this appointment before you leave today or you may call back [phone] and schedule an appointment.  Please make sure when you call that you mention that you are scheduling your Annual Wellness Visit with the clinical pharmacist so that the appointment may be made for the proper length of time.     Continue current medications. Continue good therapeutic lifestyle changes which include good diet and exercise. Fall precautions discussed with patient. If an FOBT was given today- please return it to our front desk. If you are over 48 years old - you may need Prevnar 23 or the adult Pneumonia vaccine.  Flu Shots will be available at our office starting mid- September. Please call and schedule a FLU CLINIC APPOINTMENT.  RETURN FOR LABS ON OR AFTER 06/12/14 - FASTING!  Continue to drink more fluids and eat more fresh vegetables with fiber, this will help with your bowel  movements and help with more regularity Also try MiraLAX 3 times weekly to see if this will help her bowel habits be more regular Get more active physically and get more exercise especially walking. We will check all of your lab work as mentioned above and a couple weeks and we will adjust her thyroid medicine at that time Return the FOBT Try melatonin 3 mg at bedtime for sleep We will call you with the urine microalbumin results that we do today

## 2014-05-31 NOTE — Progress Notes (Signed)
Subjective:    Patient ID: Lisa Sutton, female    DOB: 1933/12/24, 81 y.o.   MRN: [id number]  HPI Pt here for follow up and management of chronic medical problems which include hypothyroid, hyperlipidemia, diabetes, and Hypertension. The patient continues to take her levothyroxin, Crestor, and blood pressure medications which include lisinopril HCT, atenolol, furosemide,. She is also taking vitamin D3 for her vitamin D deficiency. Today she complains of a feeling of not emptying well with her bowel movements. She had her last colonoscopy in October 2010 and it was noted that it should be repeated in 7 years. She also says she is not sleeping well. She needs refills on all of her medications. It is to soon for her routine lab work and she will return to clinic for this at the appropriate time. She has had a flu shot and Prevnar vaccine. She had a FOBT in October. Because of her diabetes she will get a urine microalbumin today. It is also important to note that the patient has had carpal tunnel syndrome surgery on both wrists.      Patient Active Problem List   Diagnosis Date Noted  . Insomnia 04/12/2013  . Hyperlipidemia 04/12/2013  . Essential hypertension, benign 10/13/2012  . GERD (gastroesophageal reflux disease) 04/25/2012  . Fatty liver 08/09/2011  . Hepatitis B 08/09/2011  . Dysphagia 08/09/2011  . Type 2 diabetes mellitus 08/09/2011  . Hypothyroid 08/09/2011   Outpatient Encounter Prescriptions as of 05/31/2014  Medication Sig  . ACCU-CHEK AVIVA PLUS test strip USE AS DIRECTED TO TEST TWICE DAILY  . ADVANCED FIBER COMPLEX PO Take by mouth daily. These are gummies  . ALPRAZolam (XANAX) 0.25 MG tablet Take 1 tablet (0.25 mg total) by mouth 2 (two) times daily as needed for anxiety.  Marland Kitchen aspirin EC 81 MG tablet Take 81 mg by mouth daily.  Marland Kitchen atenolol (TENORMIN) 50 MG tablet Take 1 tablet (50 mg total) by mouth daily.  . Cholecalciferol (VITAMIN D) 1000 UNITS capsule Take 1,000  Units by mouth daily.    . fish oil-omega-3 fatty acids 1000 MG capsule Take 1,000 mg by mouth daily.    . furosemide (LASIX) 20 MG tablet Take 1 tablet (20 mg total) by mouth daily.  Marland Kitchen ibuprofen (ADVIL,MOTRIN) 200 MG tablet Take 200 mg by mouth every 6 (six) hours as needed. For pain  . levothyroxine (LEVOTHROID) 25 MCG tablet Take 1 tablet (25 mcg total) by mouth daily before breakfast.  . levothyroxine (SYNTHROID, LEVOTHROID) 112 MCG tablet Take 1 tablet (112 mcg total) by mouth daily.  Marland Kitchen lisinopril-hydrochlorothiazide (PRINZIDE,ZESTORETIC) 20-12.5 MG per tablet Take 1 tablet by mouth daily.  . metFORMIN (GLUCOPHAGE) 500 MG tablet TAKE 1 TABLET TWICE DAILY WITH A MEAL  . Multiple Vitamin (MULITIVITAMIN WITH MINERALS) TABS Take 1 tablet by mouth every other day.   Marland Kitchen omeprazole (PRILOSEC) 20 MG capsule Take 1 capsule (20 mg total) by mouth daily.  . rosuvastatin (CRESTOR) 10 MG tablet Take 10 mg by mouth daily. As directed  . vitamin B-12 (CYANOCOBALAMIN) 500 MCG tablet Take 500 mcg by mouth 3 (three) times a week.   . [DISCONTINUED] rosuvastatin (CRESTOR) 5 MG tablet Take 1 tablet (5 mg total) by mouth daily.  . [DISCONTINUED] traZODone (DESYREL) 50 MG tablet Take 0.5-1 tablets (25-50 mg total) by mouth at bedtime as needed for sleep.    Review of Systems  Constitutional: Negative.        Not sleeping well  HENT: Negative.  Eyes: Negative.   Respiratory: Negative.   Cardiovascular: Negative.   Gastrointestinal: Negative.        Feels as if she is not emptying with each BM   Endocrine: Negative.   Genitourinary: Negative.   Musculoskeletal: Negative.   Skin: Negative.   Allergic/Immunologic: Negative.   Neurological: Negative.   Hematological: Negative.   Psychiatric/Behavioral: Negative.        Objective:   Physical Exam  Constitutional: She is oriented to person, place, and time. She appears well-developed and well-nourished. No distress.  HENT:  Head: Normocephalic and  atraumatic.  Right Ear: External ear normal.  Left Ear: External ear normal.  Nose: Nose normal.  Mouth/Throat: Oropharynx is clear and moist. No oropharyngeal exudate.  Eyes: Conjunctivae and EOM are normal. Pupils are equal, round, and reactive to light. Right eye exhibits no discharge. Left eye exhibits no discharge. No scleral icterus.  Neck: Normal range of motion. Neck supple. No thyromegaly present.  There is no anterior cervical adenopathy or carotid bruits.  Cardiovascular: Normal rate, regular rhythm, normal heart sounds and intact distal pulses.  Exam reveals no gallop and no friction rub.   No murmur heard. At 72/m without murmur  Pulmonary/Chest: Effort normal and breath sounds normal. No respiratory distress. She has no wheezes. She has no rales. She exhibits no tenderness.  Clear anteriorly and posteriorly  Abdominal: Soft. Bowel sounds are normal. She exhibits distension. She exhibits no mass. There is no tenderness. There is no rebound and no guarding.  Mild obesity with some gaseous distention without masses or tenderness or inguinal adenopathy  Musculoskeletal: Normal range of motion. She exhibits no edema or tenderness.  Lymphadenopathy:    She has no cervical adenopathy.  Neurological: She is alert and oriented to person, place, and time. She has normal reflexes. No cranial nerve deficit.  Skin: Skin is warm and dry. No rash noted.  Psychiatric: She has a normal mood and affect. Her behavior is normal. Judgment and thought content normal.  Nursing note and vitals reviewed.  BP 128/71 mmHg  Pulse 65  Temp(Src) 97.2 F (36.2 C) (Oral)  Ht 5' 3" (1.6 m)  Wt 179 lb (81.194 kg)  BMI 31.72 kg/m2        Assessment & Plan:  1. Type 2 diabetes mellitus without complication -Continue current treatment and return to clinic for lab work and hemoglobin A1c in a couple weeks -Try to do better with therapeutic lifestyle changes which include diet and exercise -Monitor  blood sugars and bring these in for review at each visit - POCT UA - Microalbumin - POCT CBC; Future - POCT glycosylated hemoglobin (Hb A1C); Future  2. Essential hypertension, benign -Continue to take current medication and when necessary Lasix - POCT UA - Microalbumin - POCT CBC; Future - BMP8+EGFR; Future - Hepatic function panel; Future - NMR, lipoprofile; Future  3. Hypothyroidism, unspecified hypothyroidism type -Continue current thyroid treatment plan and we will adjust this with the future lab work. - POCT CBC; Future - Thyroid Panel With TSH; Future  4. Hyperlipidemia -Continue Crestor as doing until future lab work is returned - POCT CBC; Future - NMR, lipoprofile; Future  5. Bilateral leg edema -Continue when necessary Lasix - furosemide (LASIX) 20 MG tablet; Take 1 tablet (20 mg total) by mouth daily.  Dispense: 90 tablet; Refill: 3 - POCT CBC; Future  6. Weight gain -Resume a more regular exercise program - furosemide (LASIX) 20 MG tablet; Take 1 tablet (20 mg total) by  mouth daily.  Dispense: 90 tablet; Refill: 3 - POCT CBC; Future  7. Gastroesophageal reflux disease without esophagitis -Continue the omeprazole and watch foods which will irritate your GI tract as specimen watch and avoid NSAIDs if possible - omeprazole (PRILOSEC) 20 MG capsule; Take 1 capsule (20 mg total) by mouth daily.  Dispense: 90 capsule; Refill: 3 - POCT CBC; Future  8. Lymphadenopathy - levothyroxine (SYNTHROID, LEVOTHROID) 112 MCG tablet; Take 1 tablet (112 mcg total) by mouth daily.  Dispense: 90 tablet; Refill: 3 - POCT CBC; Future  9. Vitamin D deficiency -Continue current treatment and we will adjust this depending on future lab work - Vit D  25 hydroxy (rtn osteoporosis monitoring); Future  10. Insomnia -Try melatonin 3 mg over-the-counter  11. Constipation -Take MiraLAX 3 times weekly, drink more water, get more exercise  Meds ordered this encounter  Medications  .  DISCONTD: rosuvastatin (CRESTOR) 10 MG tablet    Sig: Take 10 mg by mouth daily. As directed  . furosemide (LASIX) 20 MG tablet    Sig: Take 1 tablet (20 mg total) by mouth daily.    Dispense:  90 tablet    Refill:  3  . omeprazole (PRILOSEC) 20 MG capsule    Sig: Take 1 capsule (20 mg total) by mouth daily.    Dispense:  90 capsule    Refill:  3  . levothyroxine (SYNTHROID, LEVOTHROID) 112 MCG tablet    Sig: Take 1 tablet (112 mcg total) by mouth daily.    Dispense:  90 tablet    Refill:  3  . ALPRAZolam (XANAX) 0.25 MG tablet    Sig: Take 1 tablet (0.25 mg total) by mouth 2 (two) times daily as needed for anxiety.    Dispense:  180 tablet    Refill:  1  . atenolol (TENORMIN) 50 MG tablet    Sig: Take 1 tablet (50 mg total) by mouth daily.    Dispense:  90 tablet    Refill:  3  . levothyroxine (LEVOTHROID) 25 MCG tablet    Sig: Take 1 tablet (25 mcg total) by mouth daily before breakfast.    Dispense:  90 tablet    Refill:  3  . lisinopril-hydrochlorothiazide (PRINZIDE,ZESTORETIC) 20-12.5 MG per tablet    Sig: Take 1 tablet by mouth daily.    Dispense:  90 tablet    Refill:  3  . metFORMIN (GLUCOPHAGE) 500 MG tablet    Sig: TAKE 1 TABLET TWICE DAILY WITH A MEAL    Dispense:  180 tablet    Refill:  3  . rosuvastatin (CRESTOR) 10 MG tablet    Sig: Take 1 tablet (10 mg total) by mouth daily. As directed    Dispense:  90 tablet    Refill:  3  . hydrocortisone cream 1 %    Sig: Apply 1 application topically 2 (two) times daily.    Dispense:  30 g    Refill:  2   Patient Instructions                       Medicare Annual Wellness Visit  [place] and the medical providers at [facility] strive to bring you the best medical care.  In doing so we not only want to address your current medical conditions and concerns but also to detect new conditions early and prevent illness, disease and health-related problems.    Medicare offers a yearly Wellness  Visit which allows  our clinical staff to assess your need for preventative services including immunizations, lifestyle education, counseling to decrease risk of preventable diseases and screening for fall risk and other medical concerns.    This visit is provided free of charge (no copay) for all Medicare recipients. The clinical pharmacists at [facility] have begun to conduct these Wellness Visits which will also include a thorough review of all your medications.    As you primary medical provider recommend that you make an appointment for your Annual Wellness Visit if you have not done so already this year.  You may set up this appointment before you leave today or you may call back ([phone]) and schedule an appointment.  Please make sure when you call that you mention that you are scheduling your Annual Wellness Visit with the clinical pharmacist so that the appointment may be made for the proper length of time.     Continue current medications. Continue good therapeutic lifestyle changes which include good diet and exercise. Fall precautions discussed with patient. If an FOBT was given today- please return it to our front desk. If you are over 17 years old - you may need Prevnar 66 or the adult Pneumonia vaccine.  Flu Shots will be available at our office starting mid- September. Please call and schedule a FLU CLINIC APPOINTMENT.  RETURN FOR LABS ON OR AFTER 06/12/14 - FASTING!  Continue to drink more fluids and eat more fresh vegetables with fiber, this will help with your bowel movements and help with more regularity Also try MiraLAX 3 times weekly to see if this will help her bowel habits be more regular Get more active physically and get more exercise especially walking. We will check all of your lab work as mentioned above and a couple weeks and we will adjust her thyroid medicine at that time Return the FOBT Try melatonin 3 mg at bedtime for sleep We will call  you with the urine microalbumin results that we do today   Arrie Senate MD

## 2014-06-13 ENCOUNTER — Ambulatory Visit (HOSPITAL_COMMUNITY): Payer: Commercial Managed Care - HMO

## 2014-06-19 ENCOUNTER — Other Ambulatory Visit (INDEPENDENT_AMBULATORY_CARE_PROVIDER_SITE_OTHER): Payer: Medicare Other

## 2014-06-19 ENCOUNTER — Other Ambulatory Visit: Payer: Self-pay | Admitting: Family Medicine

## 2014-06-19 DIAGNOSIS — E039 Hypothyroidism, unspecified: Secondary | ICD-10-CM

## 2014-06-19 DIAGNOSIS — K219 Gastro-esophageal reflux disease without esophagitis: Secondary | ICD-10-CM

## 2014-06-19 DIAGNOSIS — E785 Hyperlipidemia, unspecified: Secondary | ICD-10-CM | POA: Diagnosis not present

## 2014-06-19 DIAGNOSIS — E119 Type 2 diabetes mellitus without complications: Secondary | ICD-10-CM

## 2014-06-19 DIAGNOSIS — I10 Essential (primary) hypertension: Secondary | ICD-10-CM | POA: Diagnosis not present

## 2014-06-19 DIAGNOSIS — R600 Localized edema: Secondary | ICD-10-CM

## 2014-06-19 DIAGNOSIS — R591 Generalized enlarged lymph nodes: Secondary | ICD-10-CM

## 2014-06-19 DIAGNOSIS — E559 Vitamin D deficiency, unspecified: Secondary | ICD-10-CM | POA: Diagnosis not present

## 2014-06-19 DIAGNOSIS — R635 Abnormal weight gain: Secondary | ICD-10-CM

## 2014-06-19 LAB — POCT CBC
Granulocyte percent: 71.1 %G (ref 37–80)
HCT, POC: 40.7 % (ref 37.7–47.9)
HEMOGLOBIN: 13.4 g/dL (ref 12.2–16.2)
LYMPH, POC: 1.9 (ref 0.6–3.4)
MCH, POC: 30.1 pg (ref 27–31.2)
MCHC: 32.9 g/dL (ref 31.8–35.4)
MCV: 91.4 fL (ref 80–97)
MPV: 5.9 fL (ref 0–99.8)
POC Granulocyte: 6.4 (ref 2–6.9)
POC LYMPH PERCENT: 21.2 %L (ref 10–50)
Platelet Count, POC: 360 10*3/uL (ref 142–424)
RBC: 4.5 M/uL (ref 4.04–5.48)
RDW, POC: 13.3 %
WBC: 9 10*3/uL (ref 4.6–10.2)

## 2014-06-19 LAB — POCT GLYCOSYLATED HEMOGLOBIN (HGB A1C): Hemoglobin A1C: 6

## 2014-06-19 MED ORDER — GLUCOSE BLOOD VI STRP: ORAL_STRIP | Status: DC

## 2014-06-19 NOTE — Telephone Encounter (Signed)
done

## 2014-06-19 NOTE — Progress Notes (Signed)
Lab only 

## 2014-06-20 LAB — NMR, LIPOPROFILE
CHOLESTEROL: 145 mg/dL (ref 100–199)
HDL Cholesterol by NMR: 41 mg/dL (ref >39)
HDL Particle Number: 29.9 umol/L — ABNORMAL LOW (ref >=30.5)
LDL Particle Number: 837 nmol/L (ref <1000)
LDL SIZE: 20.3 nm (ref >20.5)
LDL-C: 69 mg/dL (ref 0–99)
LP-IR SCORE: 55 — AB (ref <=45)
Small LDL Particle Number: 486 nmol/L (ref <=527)
Triglycerides by NMR: 174 mg/dL — ABNORMAL HIGH (ref 0–149)

## 2014-06-20 LAB — BMP8+EGFR
BUN / CREAT RATIO: 18 (ref 11–26)
BUN: 20 mg/dL (ref 8–27)
CO2: 23 mmol/L (ref 18–29)
Calcium: 9 mg/dL (ref 8.7–10.3)
Chloride: 101 mmol/L (ref 97–108)
Creatinine, Ser: 1.11 mg/dL — ABNORMAL HIGH (ref 0.57–1.00)
GFR calc Af Amer: 55 mL/min/{1.73_m2} — ABNORMAL LOW (ref >59)
GFR calc non Af Amer: 48 mL/min/{1.73_m2} — ABNORMAL LOW (ref >59)
Glucose: 115 mg/dL — ABNORMAL HIGH (ref 65–99)
Potassium: 4.7 mmol/L (ref 3.5–5.2)
Sodium: 139 mmol/L (ref 134–144)

## 2014-06-20 LAB — THYROID PANEL WITH TSH
FREE THYROXINE INDEX: 2.5 (ref 1.2–4.9)
T3 Uptake Ratio: 34 % (ref 24–39)
T4, Total: 7.4 ug/dL (ref 4.5–12.0)
TSH: 11.38 u[IU]/mL — AB (ref 0.450–4.500)

## 2014-06-20 LAB — HEPATIC FUNCTION PANEL
ALT: 10 IU/L (ref 0–32)
AST: 20 IU/L (ref 0–40)
Albumin: 3.5 g/dL (ref 3.5–4.8)
Alkaline Phosphatase: 65 IU/L (ref 39–117)
BILIRUBIN DIRECT: 0.09 mg/dL (ref 0.00–0.40)
BILIRUBIN TOTAL: 0.2 mg/dL (ref 0.0–1.2)
Total Protein: 5.7 g/dL — ABNORMAL LOW (ref 6.0–8.5)

## 2014-06-20 LAB — VITAMIN D 25 HYDROXY (VIT D DEFICIENCY, FRACTURES): Vit D, 25-Hydroxy: 24.9 ng/mL — ABNORMAL LOW (ref 30.0–100.0)

## 2014-06-21 ENCOUNTER — Telehealth: Payer: Self-pay | Admitting: Family Medicine

## 2014-06-21 ENCOUNTER — Other Ambulatory Visit: Payer: Self-pay | Admitting: *Deleted

## 2014-06-21 MED ORDER — GLUCOSE BLOOD VI STRP: ORAL_STRIP | Status: DC

## 2014-06-21 NOTE — Telephone Encounter (Signed)
Stp reviewed instructions on taking her levothyroxine. Pt voiced understanding.

## 2014-06-28 ENCOUNTER — Other Ambulatory Visit: Payer: Self-pay | Admitting: *Deleted

## 2014-07-03 ENCOUNTER — Encounter: Payer: Self-pay | Admitting: *Deleted

## 2014-07-17 ENCOUNTER — Other Ambulatory Visit (INDEPENDENT_AMBULATORY_CARE_PROVIDER_SITE_OTHER): Payer: Medicare Other

## 2014-07-17 DIAGNOSIS — Z79899 Other long term (current) drug therapy: Secondary | ICD-10-CM

## 2014-07-18 ENCOUNTER — Other Ambulatory Visit: Payer: Self-pay | Admitting: *Deleted

## 2014-07-18 DIAGNOSIS — E039 Hypothyroidism, unspecified: Secondary | ICD-10-CM

## 2014-07-18 LAB — THYROID PANEL WITH TSH
Free Thyroxine Index: 2.8 (ref 1.2–4.9)
T3 Uptake Ratio: 34 % (ref 24–39)
T4, Total: 8.3 ug/dL (ref 4.5–12.0)
TSH: 8.3 u[IU]/mL — ABNORMAL HIGH (ref 0.450–4.500)

## 2014-07-29 DIAGNOSIS — J069 Acute upper respiratory infection, unspecified: Secondary | ICD-10-CM | POA: Diagnosis not present

## 2014-08-28 DIAGNOSIS — M25511 Pain in right shoulder: Secondary | ICD-10-CM | POA: Diagnosis not present

## 2014-09-03 ENCOUNTER — Other Ambulatory Visit (INDEPENDENT_AMBULATORY_CARE_PROVIDER_SITE_OTHER): Payer: Medicare Other

## 2014-09-03 DIAGNOSIS — E039 Hypothyroidism, unspecified: Secondary | ICD-10-CM | POA: Diagnosis not present

## 2014-09-03 NOTE — Progress Notes (Signed)
Lab only 

## 2014-09-04 LAB — THYROID PANEL WITH TSH
FREE THYROXINE INDEX: 3.1 (ref 1.2–4.9)
T3 UPTAKE RATIO: 32 % (ref 24–39)
T4, Total: 9.6 ug/dL (ref 4.5–12.0)
TSH: 1.83 u[IU]/mL (ref 0.450–4.500)

## 2014-09-05 ENCOUNTER — Other Ambulatory Visit: Payer: Self-pay | Admitting: Family Medicine

## 2014-10-10 DIAGNOSIS — Z961 Presence of intraocular lens: Secondary | ICD-10-CM | POA: Diagnosis not present

## 2014-10-10 DIAGNOSIS — E119 Type 2 diabetes mellitus without complications: Secondary | ICD-10-CM | POA: Diagnosis not present

## 2014-10-17 ENCOUNTER — Encounter: Payer: Self-pay | Admitting: Family Medicine

## 2014-10-17 ENCOUNTER — Ambulatory Visit (INDEPENDENT_AMBULATORY_CARE_PROVIDER_SITE_OTHER): Payer: Medicare Other

## 2014-10-17 ENCOUNTER — Other Ambulatory Visit: Payer: Self-pay | Admitting: Family Medicine

## 2014-10-17 ENCOUNTER — Ambulatory Visit (INDEPENDENT_AMBULATORY_CARE_PROVIDER_SITE_OTHER): Payer: Medicare Other | Admitting: Family Medicine

## 2014-10-17 VITALS — BP 150/76 | HR 61 | Temp 97.4°F | Ht 63.0 in | Wt 160.0 lb

## 2014-10-17 DIAGNOSIS — K219 Gastro-esophageal reflux disease without esophagitis: Secondary | ICD-10-CM

## 2014-10-17 DIAGNOSIS — Z780 Asymptomatic menopausal state: Secondary | ICD-10-CM

## 2014-10-17 DIAGNOSIS — E119 Type 2 diabetes mellitus without complications: Secondary | ICD-10-CM

## 2014-10-17 DIAGNOSIS — E559 Vitamin D deficiency, unspecified: Secondary | ICD-10-CM

## 2014-10-17 DIAGNOSIS — Z13820 Encounter for screening for osteoporosis: Secondary | ICD-10-CM | POA: Diagnosis not present

## 2014-10-17 DIAGNOSIS — E039 Hypothyroidism, unspecified: Secondary | ICD-10-CM

## 2014-10-17 DIAGNOSIS — R634 Abnormal weight loss: Secondary | ICD-10-CM | POA: Diagnosis not present

## 2014-10-17 DIAGNOSIS — I10 Essential (primary) hypertension: Secondary | ICD-10-CM | POA: Diagnosis not present

## 2014-10-17 DIAGNOSIS — E785 Hyperlipidemia, unspecified: Secondary | ICD-10-CM | POA: Diagnosis not present

## 2014-10-17 LAB — POCT CBC
Granulocyte percent: 60.3 %G (ref 37–80)
HCT, POC: 36.4 % — AB (ref 37.7–47.9)
Hemoglobin: 12 g/dL — AB (ref 12.2–16.2)
LYMPH, POC: 2.8 (ref 0.6–3.4)
MCH, POC: 29.8 pg (ref 27–31.2)
MCHC: 33 g/dL (ref 31.8–35.4)
MCV: 90.2 fL (ref 80–97)
MPV: 5.5 fL (ref 0–99.8)
POC GRANULOCYTE: 5.4 (ref 2–6.9)
POC LYMPH PERCENT: 31 %L (ref 10–50)
Platelet Count, POC: 389 10*3/uL (ref 142–424)
RBC: 4.03 M/uL — AB (ref 4.04–5.48)
RDW, POC: 13.2 %
WBC: 9 10*3/uL (ref 4.6–10.2)

## 2014-10-17 LAB — POCT GLYCOSYLATED HEMOGLOBIN (HGB A1C): HEMOGLOBIN A1C: 6.2

## 2014-10-17 NOTE — Progress Notes (Signed)
Subjective:    Patient ID: Lisa Sutton, female    DOB: 24-Feb-1934, 81 y.o.   MRN: [id number]  HPI Pt here for follow up and management of chronic medical problems which includes hypothyroid, hypertension, and diabetes. She is taking medications as directed. The patient reports a decrease in appetite and a weight loss of 19 pounds since her last visit. This visit was in January. Her last colonoscopy was in 2010. The patient is gaining some weight now and her appetite is improving. She has her yearly appointment with the gastroenterologist in the fall of this year. The only correlation with the weight loss that we can come up with is that her thyroid medicine was increased back in the winter months and we will be rechecking this again today. The patient denies chest pain shortness of breath trouble swallowing other than occasional heartburn which she is always had and also denies blood in the stool or black tarry bowel movements. She also complains of no problems with passing her water. She did have a CT scan of the abdomen by the oncologist last fall and this basically showed a fatty infiltration of the liver.      Patient Active Problem List   Diagnosis Date Noted  . Insomnia 04/12/2013  . Hyperlipidemia 04/12/2013  . Essential hypertension, benign 10/13/2012  . GERD (gastroesophageal reflux disease) 04/25/2012  . Fatty liver 08/09/2011  . Hepatitis B 08/09/2011  . Dysphagia 08/09/2011  . Type 2 diabetes mellitus 08/09/2011  . Hypothyroid 08/09/2011   Outpatient Encounter Prescriptions as of 10/17/2014  Medication Sig  . ADVANCED FIBER COMPLEX PO Take by mouth daily. These are gummies  . ALPRAZolam (XANAX) 0.25 MG tablet Take 1 tablet (0.25 mg total) by mouth 2 (two) times daily as needed for anxiety.  Marland Kitchen aspirin EC 81 MG tablet Take 81 mg by mouth daily.  Marland Kitchen atenolol (TENORMIN) 50 MG tablet Take 1 tablet (50 mg total) by mouth daily.  . Cholecalciferol (VITAMIN D) 1000 UNITS  capsule Take 1,000 Units by mouth daily.    . fish oil-omega-3 fatty acids 1000 MG capsule Take 1,000 mg by mouth daily.    . furosemide (LASIX) 20 MG tablet Take 1 tablet (20 mg total) by mouth daily.  Marland Kitchen glucose blood (ACCU-CHEK AVIVA PLUS) test strip Test BS bid and PRN. Dx E11.9  . hydrocortisone cream 1 % Apply 1 application topically 2 (two) times daily.  Marland Kitchen ibuprofen (ADVIL,MOTRIN) 200 MG tablet Take 200 mg by mouth every 6 (six) hours as needed. For pain  . levothyroxine (LEVOTHROID) 25 MCG tablet Take 1 tablet (25 mcg total) by mouth daily before breakfast.  . levothyroxine (SYNTHROID, LEVOTHROID) 112 MCG tablet Take 1 tablet (112 mcg total) by mouth daily.  Marland Kitchen lisinopril-hydrochlorothiazide (PRINZIDE,ZESTORETIC) 20-12.5 MG per tablet Take 1 tablet by mouth daily.  . meclizine (ANTIVERT) 12.5 MG tablet TAKE  (1)  TABLET  FOUR TIMES DAILY AS NEEDED.  . metFORMIN (GLUCOPHAGE) 500 MG tablet TAKE 1 TABLET TWICE DAILY WITH A MEAL  . Multiple Vitamin (MULITIVITAMIN WITH MINERALS) TABS Take 1 tablet by mouth every other day.   Marland Kitchen omeprazole (PRILOSEC) 20 MG capsule Take 1 capsule (20 mg total) by mouth daily.  . rosuvastatin (CRESTOR) 10 MG tablet Take 1 tablet (10 mg total) by mouth daily. As directed  . vitamin B-12 (CYANOCOBALAMIN) 500 MCG tablet Take 500 mcg by mouth 3 (three) times a week.    No facility-administered encounter medications on file as of 10/17/2014.  Review of Systems  Constitutional: Positive for appetite change (decrease), fatigue and unexpected weight change (down 19 lbs since last visit).  HENT: Negative.   Eyes: Negative.   Respiratory: Negative.   Cardiovascular: Negative.   Gastrointestinal: Negative.   Endocrine: Negative.   Genitourinary: Negative.   Musculoskeletal: Negative.   Skin: Negative.   Allergic/Immunologic: Negative.   Neurological: Negative.   Hematological: Negative.   Psychiatric/Behavioral: Negative.        Objective:   Physical Exam   Constitutional: She is oriented to person, place, and time. She appears well-developed and well-nourished. No distress.  The patient looks good, is alert but says she just feels really tired and fatigued in the morning when she arises and feels better during the day. She indicates that her appetite is improving and all indications seem to go back to the point where her thyroid medicine was adjusted and this is when the weight loss occurred.  HENT:  Head: Normocephalic and atraumatic.  Right Ear: External ear normal.  Left Ear: External ear normal.  Nose: Nose normal.  Mouth/Throat: Oropharynx is clear and moist.  Eyes: Conjunctivae and EOM are normal. Pupils are equal, round, and reactive to light. Right eye exhibits no discharge. Left eye exhibits no discharge. No scleral icterus.  Neck: Normal range of motion. Neck supple. No thyromegaly present.  No carotid bruits or anterior cervical adenopathy  Cardiovascular: Normal rate, regular rhythm, normal heart sounds and intact distal pulses.   No murmur heard. At 72/m with a regular rate and rhythm  Pulmonary/Chest: Effort normal and breath sounds normal. No respiratory distress. She has no wheezes. She has no rales. She exhibits no tenderness.  Good breath sounds bilaterally. No axillary adenopathy  Abdominal: Soft. Bowel sounds are normal. She exhibits no mass. There is no tenderness. There is no rebound and no guarding.  The abdomen was soft without masses tenderness or organ enlargement and there was no inguinal adenopathy  Musculoskeletal: Normal range of motion. She exhibits no edema or tenderness.  Lymphadenopathy:    She has no cervical adenopathy.  Neurological: She is alert and oriented to person, place, and time. She has normal reflexes. No cranial nerve deficit.  Skin: Skin is warm and dry. No rash noted. No erythema.  Psychiatric: She has a normal mood and affect. Her behavior is normal. Judgment and thought content normal.    Nursing note and vitals reviewed.  BP 150/76 mmHg  Pulse 61  Temp(Src) 97.4 F (36.3 C) (Oral)  Ht 5' 3"  (1.6 m)  Wt 160 lb (72.576 kg)  BMI 28.35 kg/m2        Assessment & Plan:  1. Type 2 diabetes mellitus without complication -The patient should continue with current treatment pending results of lab work - POCT CBC - POCT glycosylated hemoglobin (Hb A1C)  2. Essential hypertension, benign -The blood pressure is slightly elevated today but there will be no changes made with her medication pending results of lab work. Her current medication includes lisinopril/HCTZ 20-12 0.5 one daily and when necessary Lasix 20 mg - POCT CBC - BMP8+EGFR - Hepatic function panel  3. Hypothyroidism, unspecified hypothyroidism type -The thyroid medication has been adjusted recently and we will not change the current treatment which is 112 g +25 g daily until the lab work is returned - POCT CBC - Thyroid Panel With TSH  4. Hyperlipidemia -The patient is currently taking Crestor and she should continue this until current lab work is available - POCT CBC -  Lipid panel  5. Gastroesophageal reflux disease without esophagitis -She has no more increased reflux symptoms that she has had in the past - POCT CBC  6. Vitamin D deficiency -She should continue current vitamin D dosage pending results of lab work - Vit D  25 hydroxy (rtn osteoporosis monitoring)  7. Screening for osteoporosis -She received a DEXA scan today and we will let her know the results of this as soon as those results become available  8. Postmenopausal -She still has some vasomotor spasms but they seem to be improving  9. Loss of weight -We are uncertain about the cause of this and will continue to look at the lab work today and may discuss this with her gastroenterologist once all the information is returned  No orders of the defined types were placed in this encounter.   Patient Instructions                        Medicare Annual Wellness Visit  [facility] and the medical providers at [facility] strive to bring you the best medical care.  In doing so we not only want to address your current medical conditions and concerns but also to detect new conditions early and prevent illness, disease and health-related problems.    Medicare offers a yearly Wellness Visit which allows our clinical staff to assess your need for preventative services including immunizations, lifestyle education, counseling to decrease risk of preventable diseases and screening for fall risk and other medical concerns.    This visit is provided free of charge (no copay) for all Medicare recipients. The clinical pharmacists at [facility] have begun to conduct these Wellness Visits which will also include a thorough review of all your medications.    As you primary medical provider recommend that you make an appointment for your Annual Wellness Visit if you have not done so already this year.  You may set up this appointment before you leave today or you may call back ([phone]) and schedule an appointment.  Please make sure when you call that you mention that you are scheduling your Annual Wellness Visit with the clinical pharmacist so that the appointment may be made for the proper length of time.     Continue current medications. Continue good therapeutic lifestyle changes which include good diet and exercise. Fall precautions discussed with patient. If an FOBT was given today- please return it to our front desk. If you are over 68 years old - you may need Prevnar 59 or the adult Pneumonia vaccine.  Flu Shots are still available at our office. If you still haven't had one please call to set up a nurse visit to get one.   After your visit with [place] today you will receive a survey in the mail or online from [employer] regarding your care with [place]. Please take a moment to fill this out. Your  feedback is very important to [place] as you can help [place] better understand your patient needs as well as improve your experience and satisfaction. WE CARE ABOUT YOU!!!   We will call you with the results of the lab work as soon as it becomes available We will be closely looking at the thyroid profile to make sure that you're on the most appropriate dose of medicine For now, continue current treatment If you associate any additional symptoms with feeling bad please call us back sooner The patient indicates that she had  an ultrasound of her abdomen last fall and that this was okay.------ upon review this was actually a CT scan and it showed a slight fatty infiltration of the liver with no other abnormal findings   Arrie Senate MD

## 2014-10-17 NOTE — Patient Instructions (Addendum)
Medicare Annual Wellness Visit  [facility] and the medical providers at [facility] strive to bring you the best medical care.  In doing so we not only want to address your current medical conditions and concerns but also to detect new conditions early and prevent illness, disease and health-related problems.    Medicare offers a yearly Wellness Visit which allows our clinical staff to assess your need for preventative services including immunizations, lifestyle education, counseling to decrease risk of preventable diseases and screening for fall risk and other medical concerns.    This visit is provided free of charge (no copay) for all Medicare recipients. The clinical pharmacists at [facility] have begun to conduct these Wellness Visits which will also include a thorough review of all your medications.    As you primary medical provider recommend that you make an appointment for your Annual Wellness Visit if you have not done so already this year.  You may set up this appointment before you leave today or you may call back [phone]) and schedule an appointment.  Please make sure when you call that you mention that you are scheduling your Annual Wellness Visit with the clinical pharmacist so that the appointment may be made for the proper length of time.     Continue current medications. Continue good therapeutic lifestyle changes which include good diet and exercise. Fall precautions discussed with patient. If an FOBT was given today- please return it to our front desk. If you are over 36 years old - you may need Prevnar 68 or the adult Pneumonia vaccine.  Flu Shots are still available at our office. If you still haven't had one please call to set up a nurse visit to get one.   After your visit with [place] today you will receive a survey in the mail or online from [employer] regarding your care with [place]. Please take a moment to  fill this out. Your feedback is very important to [place] as you can help [place] better understand your patient needs as well as improve your experience and satisfaction. WE CARE ABOUT YOU!!!   We will call you with the results of the lab work as soon as it becomes available We will be closely looking at the thyroid profile to make sure that you're on the most appropriate dose of medicine For now, continue current treatment If you associate any additional symptoms with feeling bad please call us back sooner The patient indicates that she had an ultrasound of her abdomen last fall and that this was okay.------ upon review this was actually a CT scan and it showed a slight fatty infiltration of the liver with no other abnormal findings

## 2014-10-18 ENCOUNTER — Other Ambulatory Visit: Payer: Medicare Other

## 2014-10-18 DIAGNOSIS — Z1212 Encounter for screening for malignant neoplasm of rectum: Secondary | ICD-10-CM

## 2014-10-18 LAB — BMP8+EGFR
BUN/Creatinine Ratio: 20 (ref 11–26)
BUN: 19 mg/dL (ref 8–27)
CO2: 26 mmol/L (ref 18–29)
Calcium: 9.7 mg/dL (ref 8.7–10.3)
Chloride: 100 mmol/L (ref 97–108)
Creatinine, Ser: 0.96 mg/dL (ref 0.57–1.00)
GFR calc non Af Amer: 57 mL/min/{1.73_m2} — ABNORMAL LOW (ref >59)
GFR, EST AFRICAN AMERICAN: 66 mL/min/{1.73_m2} (ref >59)
GLUCOSE: 87 mg/dL (ref 65–99)
POTASSIUM: 4.5 mmol/L (ref 3.5–5.2)
Sodium: 141 mmol/L (ref 134–144)

## 2014-10-18 LAB — THYROID PANEL WITH TSH
Free Thyroxine Index: 3.1 (ref 1.2–4.9)
T3 Uptake Ratio: 31 % (ref 24–39)
T4, Total: 10 ug/dL (ref 4.5–12.0)
TSH: 1.11 u[IU]/mL (ref 0.450–4.500)

## 2014-10-18 LAB — VITAMIN D 25 HYDROXY (VIT D DEFICIENCY, FRACTURES): Vit D, 25-Hydroxy: 53.3 ng/mL (ref 30.0–100.0)

## 2014-10-18 LAB — HEPATIC FUNCTION PANEL
ALBUMIN: 3.8 g/dL (ref 3.5–4.8)
ALT: 17 IU/L (ref 0–32)
AST: 15 IU/L (ref 0–40)
Alkaline Phosphatase: 103 IU/L (ref 39–117)
BILIRUBIN, DIRECT: 0.09 mg/dL (ref 0.00–0.40)
Bilirubin Total: 0.3 mg/dL (ref 0.0–1.2)
TOTAL PROTEIN: 6.4 g/dL (ref 6.0–8.5)

## 2014-10-18 LAB — LIPID PANEL
CHOL/HDL RATIO: 3.1 ratio (ref 0.0–4.4)
Cholesterol, Total: 148 mg/dL (ref 100–199)
HDL: 48 mg/dL (ref >39)
LDL Calculated: 56 mg/dL (ref 0–99)
Triglycerides: 218 mg/dL — ABNORMAL HIGH (ref 0–149)
VLDL Cholesterol Cal: 44 mg/dL — ABNORMAL HIGH (ref 5–40)

## 2014-10-18 NOTE — Progress Notes (Signed)
Lab only 

## 2014-10-20 LAB — FECAL OCCULT BLOOD, IMMUNOCHEMICAL: FECAL OCCULT BLD: NEGATIVE

## 2014-10-23 ENCOUNTER — Ambulatory Visit: Payer: Medicare Other | Admitting: Family Medicine

## 2014-11-06 DIAGNOSIS — M1712 Unilateral primary osteoarthritis, left knee: Secondary | ICD-10-CM | POA: Diagnosis not present

## 2014-11-06 DIAGNOSIS — M25511 Pain in right shoulder: Secondary | ICD-10-CM | POA: Diagnosis not present

## 2014-11-06 DIAGNOSIS — M25562 Pain in left knee: Secondary | ICD-10-CM | POA: Diagnosis not present

## 2014-11-07 ENCOUNTER — Other Ambulatory Visit: Payer: Self-pay | Admitting: Orthopaedic Surgery

## 2014-11-07 DIAGNOSIS — M25511 Pain in right shoulder: Secondary | ICD-10-CM

## 2014-11-15 ENCOUNTER — Other Ambulatory Visit: Payer: Self-pay | Admitting: *Deleted

## 2014-11-15 ENCOUNTER — Encounter: Payer: Self-pay | Admitting: *Deleted

## 2014-11-15 DIAGNOSIS — R634 Abnormal weight loss: Secondary | ICD-10-CM

## 2014-11-15 NOTE — Progress Notes (Signed)
Make appointment with the gastroenterologist for follow-up because of continued weight loss.

## 2014-11-15 NOTE — Progress Notes (Signed)
Pt came by today for 4 week weight check per DWM. She is down today 4 lbs - she made the comment that DWM may send her to Dr Laural Golden if weight loss continued.  What do you suggest?

## 2014-11-15 NOTE — Progress Notes (Signed)
Will refer and call pt

## 2014-11-16 ENCOUNTER — Ambulatory Visit
Admission: RE | Admit: 2014-11-16 | Discharge: 2014-11-16 | Disposition: A | Discharge status: Home / Self Care | Payer: Medicare Other | Source: Ambulatory Visit | Attending: Orthopaedic Surgery | Admitting: Orthopaedic Surgery

## 2014-11-16 DIAGNOSIS — M75101 Unspecified rotator cuff tear or rupture of right shoulder, not specified as traumatic: Secondary | ICD-10-CM | POA: Diagnosis not present

## 2014-11-16 DIAGNOSIS — M25511 Pain in right shoulder: Secondary | ICD-10-CM

## 2014-11-19 ENCOUNTER — Encounter: Payer: Self-pay | Admitting: Family Medicine

## 2014-11-26 ENCOUNTER — Inpatient Hospital Stay (HOSPITAL_COMMUNITY): Payer: Medicare Other

## 2014-11-26 ENCOUNTER — Encounter (HOSPITAL_COMMUNITY): Payer: Self-pay | Admitting: Emergency Medicine

## 2014-11-26 ENCOUNTER — Inpatient Hospital Stay (HOSPITAL_COMMUNITY)
Admission: EM | Admit: 2014-11-26 | Discharge: 2014-11-28 | DRG: 682 | Disposition: A | Discharge status: Home / Self Care | Payer: Medicare Other | Attending: Internal Medicine | Admitting: Internal Medicine

## 2014-11-26 DIAGNOSIS — Z6826 Body mass index (BMI) 26.0-26.9, adult: Secondary | ICD-10-CM

## 2014-11-26 DIAGNOSIS — D72829 Elevated white blood cell count, unspecified: Secondary | ICD-10-CM | POA: Diagnosis not present

## 2014-11-26 DIAGNOSIS — E039 Hypothyroidism, unspecified: Secondary | ICD-10-CM | POA: Diagnosis not present

## 2014-11-26 DIAGNOSIS — Z87891 Personal history of nicotine dependence: Secondary | ICD-10-CM

## 2014-11-26 DIAGNOSIS — R9389 Abnormal findings on diagnostic imaging of other specified body structures: Secondary | ICD-10-CM | POA: Diagnosis present

## 2014-11-26 DIAGNOSIS — Z8249 Family history of ischemic heart disease and other diseases of the circulatory system: Secondary | ICD-10-CM | POA: Diagnosis not present

## 2014-11-26 DIAGNOSIS — Z7982 Long term (current) use of aspirin: Secondary | ICD-10-CM

## 2014-11-26 DIAGNOSIS — B1910 Unspecified viral hepatitis B without hepatic coma: Secondary | ICD-10-CM | POA: Diagnosis not present

## 2014-11-26 DIAGNOSIS — M810 Age-related osteoporosis without current pathological fracture: Secondary | ICD-10-CM | POA: Diagnosis present

## 2014-11-26 DIAGNOSIS — K760 Fatty (change of) liver, not elsewhere classified: Secondary | ICD-10-CM | POA: Diagnosis not present

## 2014-11-26 DIAGNOSIS — I10 Essential (primary) hypertension: Secondary | ICD-10-CM | POA: Diagnosis present

## 2014-11-26 DIAGNOSIS — R634 Abnormal weight loss: Secondary | ICD-10-CM | POA: Diagnosis present

## 2014-11-26 DIAGNOSIS — E860 Dehydration: Secondary | ICD-10-CM | POA: Diagnosis present

## 2014-11-26 DIAGNOSIS — E119 Type 2 diabetes mellitus without complications: Secondary | ICD-10-CM | POA: Diagnosis not present

## 2014-11-26 DIAGNOSIS — N179 Acute kidney failure, unspecified: Principal | ICD-10-CM | POA: Diagnosis present

## 2014-11-26 DIAGNOSIS — R531 Weakness: Secondary | ICD-10-CM | POA: Diagnosis not present

## 2014-11-26 DIAGNOSIS — E43 Unspecified severe protein-calorie malnutrition: Secondary | ICD-10-CM | POA: Insufficient documentation

## 2014-11-26 DIAGNOSIS — E785 Hyperlipidemia, unspecified: Secondary | ICD-10-CM | POA: Diagnosis present

## 2014-11-26 DIAGNOSIS — K219 Gastro-esophageal reflux disease without esophagitis: Secondary | ICD-10-CM | POA: Diagnosis present

## 2014-11-26 DIAGNOSIS — E1121 Type 2 diabetes mellitus with diabetic nephropathy: Secondary | ICD-10-CM

## 2014-11-26 LAB — URINALYSIS, ROUTINE W REFLEX MICROSCOPIC
Bilirubin Urine: NEGATIVE
Glucose, UA: NEGATIVE mg/dL
Ketones, ur: NEGATIVE mg/dL
LEUKOCYTES UA: NEGATIVE
NITRITE: NEGATIVE
PROTEIN: NEGATIVE mg/dL
Specific Gravity, Urine: 1.025 (ref 1.005–1.030)
Urobilinogen, UA: 0.2 mg/dL (ref 0.0–1.0)
pH: 5.5 (ref 5.0–8.0)

## 2014-11-26 LAB — COMPREHENSIVE METABOLIC PANEL
ALK PHOS: 89 U/L (ref 38–126)
ALT: 13 U/L — ABNORMAL LOW (ref 14–54)
AST: 20 U/L (ref 15–41)
Albumin: 3.8 g/dL (ref 3.5–5.0)
Anion gap: 11 (ref 5–15)
BILIRUBIN TOTAL: 0.5 mg/dL (ref 0.3–1.2)
BUN: 58 mg/dL — AB (ref 6–20)
CHLORIDE: 98 mmol/L — AB (ref 101–111)
CO2: 27 mmol/L (ref 22–32)
Calcium: 14.5 mg/dL (ref 8.9–10.3)
Creatinine, Ser: 2.17 mg/dL — ABNORMAL HIGH (ref 0.44–1.00)
GFR calc non Af Amer: 21 mL/min — ABNORMAL LOW (ref >60)
GFR, EST AFRICAN AMERICAN: 24 mL/min — AB (ref >60)
Glucose, Bld: 194 mg/dL — ABNORMAL HIGH (ref 65–99)
POTASSIUM: 4.1 mmol/L (ref 3.5–5.1)
Sodium: 136 mmol/L (ref 135–145)
Total Protein: 7.6 g/dL (ref 6.5–8.1)

## 2014-11-26 LAB — CBC WITH DIFFERENTIAL/PLATELET
BASOS PCT: 0 % (ref 0–1)
Basophils Absolute: 0.1 10*3/uL (ref 0.0–0.1)
Eosinophils Absolute: 0.1 10*3/uL (ref 0.0–0.7)
Eosinophils Relative: 1 % (ref 0–5)
HEMATOCRIT: 41.8 % (ref 36.0–46.0)
HEMOGLOBIN: 14 g/dL (ref 12.0–15.0)
LYMPHS PCT: 18 % (ref 12–46)
Lymphs Abs: 2.7 10*3/uL (ref 0.7–4.0)
MCH: 30.7 pg (ref 26.0–34.0)
MCHC: 33.5 g/dL (ref 30.0–36.0)
MCV: 91.7 fL (ref 78.0–100.0)
MONO ABS: 1.4 10*3/uL — AB (ref 0.1–1.0)
Monocytes Relative: 9 % (ref 3–12)
NEUTROS ABS: 10.7 10*3/uL — AB (ref 1.7–7.7)
NEUTROS PCT: 72 % (ref 43–77)
Platelets: 332 10*3/uL (ref 150–400)
RBC: 4.56 MIL/uL (ref 3.87–5.11)
RDW: 13 % (ref 11.5–15.5)
WBC: 14.9 10*3/uL — AB (ref 4.0–10.5)

## 2014-11-26 LAB — TSH: TSH: 1.417 u[IU]/mL (ref 0.350–4.500)

## 2014-11-26 LAB — URINE MICROSCOPIC-ADD ON

## 2014-11-26 LAB — GLUCOSE, CAPILLARY: GLUCOSE-CAPILLARY: 81 mg/dL (ref 65–99)

## 2014-11-26 MED ORDER — INSULIN ASPART 100 UNIT/ML ~~LOC~~ SOLN: 0.0000 [IU] | Freq: Every day | SUBCUTANEOUS | Status: DC

## 2014-11-26 MED ORDER — ONDANSETRON HCL 4 MG/2ML IJ SOLN
4.0000 mg | Freq: Once | INTRAMUSCULAR | Status: AC
  Administered 2014-11-26: 4 mg via INTRAVENOUS
  Filled 2014-11-26: qty 2

## 2014-11-26 MED ORDER — ENSURE ENLIVE PO LIQD
237.0000 mL | Freq: Two times a day (BID) | ORAL | Status: DC
  Administered 2014-11-27 (×2): 237 mL via ORAL

## 2014-11-26 MED ORDER — INSULIN ASPART 100 UNIT/ML ~~LOC~~ SOLN
0.0000 [IU] | Freq: Three times a day (TID) | SUBCUTANEOUS | Status: DC
  Administered 2014-11-26 – 2014-11-27 (×2): 2 [IU] via SUBCUTANEOUS

## 2014-11-26 MED ORDER — MAGNESIUM HYDROXIDE 400 MG/5ML PO SUSP
30.0000 mL | Freq: Every day | ORAL | Status: DC | PRN
  Administered 2014-11-27: 30 mL via ORAL
  Filled 2014-11-26: qty 30

## 2014-11-26 MED ORDER — SODIUM CHLORIDE 0.9 % IV SOLN
30.0000 mg | Freq: Once | INTRAVENOUS | Status: AC
  Administered 2014-11-26: 30 mg via INTRAVENOUS
  Filled 2014-11-26: qty 10

## 2014-11-26 MED ORDER — TRAZODONE HCL 50 MG PO TABS
25.0000 mg | ORAL_TABLET | Freq: Every evening | ORAL | Status: DC | PRN
  Administered 2014-11-26 – 2014-11-27 (×2): 25 mg via ORAL
  Filled 2014-11-26 (×2): qty 1

## 2014-11-26 MED ORDER — LEVOTHYROXINE SODIUM 25 MCG PO TABS
25.0000 ug | ORAL_TABLET | Freq: Every day | ORAL | Status: DC
  Administered 2014-11-27 – 2014-11-28 (×2): 25 ug via ORAL
  Filled 2014-11-26 (×2): qty 1

## 2014-11-26 MED ORDER — ONDANSETRON HCL 4 MG/2ML IJ SOLN: 4.0000 mg | Freq: Four times a day (QID) | INTRAMUSCULAR | Status: DC | PRN

## 2014-11-26 MED ORDER — ACETAMINOPHEN 650 MG RE SUPP: 650.0000 mg | Freq: Four times a day (QID) | RECTAL | Status: DC | PRN

## 2014-11-26 MED ORDER — SODIUM CHLORIDE 0.9 % IV SOLN
INTRAVENOUS | Status: DC
  Administered 2014-11-26 – 2014-11-28 (×2): via INTRAVENOUS

## 2014-11-26 MED ORDER — VITAMIN D 1000 UNITS PO TABS
1000.0000 [IU] | ORAL_TABLET | Freq: Every day | ORAL | Status: DC
  Administered 2014-11-26 – 2014-11-27 (×2): 1000 [IU] via ORAL
  Filled 2014-11-26 (×3): qty 1

## 2014-11-26 MED ORDER — ALPRAZOLAM 0.25 MG PO TABS
0.2500 mg | ORAL_TABLET | Freq: Two times a day (BID) | ORAL | Status: DC | PRN
  Administered 2014-11-27: 0.25 mg via ORAL
  Filled 2014-11-26: qty 1

## 2014-11-26 MED ORDER — ONDANSETRON HCL 4 MG PO TABS: 4.0000 mg | ORAL_TABLET | Freq: Four times a day (QID) | ORAL | Status: DC | PRN

## 2014-11-26 MED ORDER — BISACODYL 10 MG RE SUPP: 10.0000 mg | Freq: Every day | RECTAL | Status: DC | PRN

## 2014-11-26 MED ORDER — ACETAMINOPHEN 325 MG PO TABS: 650.0000 mg | ORAL_TABLET | Freq: Four times a day (QID) | ORAL | Status: DC | PRN

## 2014-11-26 MED ORDER — HEPARIN SODIUM (PORCINE) 5000 UNIT/ML IJ SOLN
5000.0000 [IU] | Freq: Three times a day (TID) | INTRAMUSCULAR | Status: DC
  Administered 2014-11-26 – 2014-11-28 (×5): 5000 [IU] via SUBCUTANEOUS
  Filled 2014-11-26 (×5): qty 1

## 2014-11-26 MED ORDER — ALUM & MAG HYDROXIDE-SIMETH 200-200-20 MG/5ML PO SUSP: 30.0000 mL | Freq: Four times a day (QID) | ORAL | Status: DC | PRN

## 2014-11-26 MED ORDER — ASPIRIN EC 81 MG PO TBEC
81.0000 mg | DELAYED_RELEASE_TABLET | Freq: Every day | ORAL | Status: DC
  Administered 2014-11-26 – 2014-11-28 (×3): 81 mg via ORAL
  Filled 2014-11-26 (×3): qty 1

## 2014-11-26 MED ORDER — LEVOTHYROXINE SODIUM 112 MCG PO TABS
112.0000 ug | ORAL_TABLET | Freq: Every day | ORAL | Status: DC
  Administered 2014-11-27 – 2014-11-28 (×2): 112 ug via ORAL
  Filled 2014-11-26 (×3): qty 1

## 2014-11-26 MED ORDER — ATENOLOL 25 MG PO TABS
50.0000 mg | ORAL_TABLET | Freq: Every day | ORAL | Status: DC
  Administered 2014-11-26 – 2014-11-28 (×3): 50 mg via ORAL
  Filled 2014-11-26 (×3): qty 2

## 2014-11-26 MED ORDER — HYDROCODONE-ACETAMINOPHEN 5-325 MG PO TABS: 1.0000 | ORAL_TABLET | ORAL | Status: DC | PRN

## 2014-11-26 NOTE — ED Notes (Signed)
Attempted calling report to unit 300.  RN will return call when available.

## 2014-11-26 NOTE — ED Notes (Signed)
PT c/o generalized weakness, difficulty ambulation and poor appetite worsening x1 month. PT also states approximately 30lbs x3 months. PT states recent adjustment to TSH medication.

## 2014-11-26 NOTE — ED Notes (Signed)
CRITICAL VALUE ALERT  Critical value received:  Calcium - 14.5  Date of notification:  11/26/2014  Time of notification:  [id number]  Critical value read back: yes  Nurse who received alert:  LJS  MD notified (1st page):  Dr Betsey Holiday  Time of first page:  36  MD notified (2nd page):  Time of second page:  Responding MD:  Dr Betsey Holiday  Time MD responded: [phone]

## 2014-11-26 NOTE — ED Provider Notes (Signed)
History  This chart was scribed for Orpah Greek, MD by Marlowe Kays, ED Scribe. This patient was seen in room APA06/APA06 and the patient's care was started at 9:59 AM.  Chief Complaint  Patient presents with  . Weakness   The history is provided by the patient and medical records. No language interpreter was used.    HPI Comments:  Lisa Sutton is a 81 y.o. female who presents to the Emergency Department complaining of generalized weakness that started about one month ago. She reports associated appetite decrease stating she has lost about 30 pounds in the past three months. She states she saw her PCP and received lab work and was referred to Dr. Laural Golden and has an appointment in six days. She has not done anything to treat her symptoms. She denies modifies factors. Denies CP, SOB, cough, abdominal pain, nausea, vomiting, fever or chills.  Past Medical History  Diagnosis Date  . Type 2 diabetes mellitus   . Essential hypertension, benign   . GERD (gastroesophageal reflux disease)   . Osteoporosis   . Obesity   . Hypothyroidism   . Dysphagia   . Hepatitis B   . Fatty liver   . Kidney stone August 2014    Patient was seen at [facility]   Past Surgical History  Procedure Laterality Date  . Abdominal hysterectomy    . Deviated septum repair    . Knee arthroscopy      left knee 2003  . Cataract extraction, bilateral     Family History  Problem Relation Age of Onset  . Hypertension Father   . Cancer Maternal Aunt    History  Substance Use Topics  . Smoking status: Never Smoker   . Smokeless tobacco: Never Used  . Alcohol Use: No   OB History    Gravida Para Term Preterm AB TAB SAB Ectopic Multiple Living            1     Review of Systems  Constitutional: Positive for appetite change. Negative for fever and chills.  Respiratory: Negative for cough and shortness of breath.   Gastrointestinal: Negative for nausea, vomiting and abdominal pain.   Neurological: Positive for weakness (generalized).  All other systems reviewed and are negative.   Allergies  Zetia; Livalo; and Zocor  Home Medications   Prior to Admission medications   Medication Sig Start Date End Date Taking? Authorizing Provider  acetaminophen (TYLENOL) 500 MG tablet Take 1,000 mg by mouth every 8 (eight) hours as needed for mild pain.   Yes Historical Provider, MD  ADVANCED FIBER COMPLEX PO Take by mouth daily. These are gummies   Yes Historical Provider, MD  ALPRAZolam (XANAX) 0.25 MG tablet Take 1 tablet (0.25 mg total) by mouth 2 (two) times daily as needed for anxiety. 05/31/14  Yes [name], MD  aspirin EC 81 MG tablet Take 81 mg by mouth daily.   Yes Historical Provider, MD  atenolol (TENORMIN) 50 MG tablet Take 1 tablet (50 mg total) by mouth daily. 05/31/14  Yes [name], MD  Cholecalciferol (VITAMIN D) 1000 UNITS capsule Take 1,000 Units by mouth daily.     Yes Historical Provider, MD  fish oil-omega-3 fatty acids 1000 MG capsule Take 1,000 mg by mouth daily.     Yes Historical Provider, MD  hydrocortisone cream 1 % Apply 1 application topically 2 (two) times daily. 05/31/14  Yes [name], MD  levothyroxine (LEVOTHROID) 25 MCG tablet Take 1  tablet (25 mcg total) by mouth daily before breakfast. 05/31/14  Yes [name], MD  levothyroxine (SYNTHROID, LEVOTHROID) 112 MCG tablet Take 1 tablet (112 mcg total) by mouth daily. 05/31/14  Yes [name], MD  lisinopril-hydrochlorothiazide (PRINZIDE,ZESTORETIC) 20-12.5 MG per tablet Take 1 tablet by mouth daily. 05/31/14  Yes [name], MD  meclizine (ANTIVERT) 12.5 MG tablet TAKE  (1)  TABLET  FOUR TIMES DAILY AS NEEDED. Patient taking differently: TAKE  (1)  TABLET  FOUR TIMES DAILY AS NEEDED DIZZINESS 09/06/14  Yes [name], MD  metFORMIN (GLUCOPHAGE) 500 MG tablet TAKE 1 TABLET TWICE DAILY WITH A MEAL 05/31/14  Yes [name], MD  Multiple Vitamin (MULITIVITAMIN WITH MINERALS) TABS Take  1 tablet by mouth daily.    Yes Historical Provider, MD  omeprazole (PRILOSEC) 20 MG capsule Take 1 capsule (20 mg total) by mouth daily. Patient taking differently: Take 20 mg by mouth daily as needed (ACID REFLUX).  05/31/14  Yes [name], MD  glucose blood (ACCU-CHEK AVIVA PLUS) test strip Test BS bid and PRN. Dx E11.9 06/21/14   [name], MD  vitamin B-12 (CYANOCOBALAMIN) 500 MCG tablet Take 500 mcg by mouth 3 (three) times a week.     Historical Provider, MD   Triage Vitals: BP 148/95 mmHg  Pulse 78  Temp(Src) 97.5 F (36.4 C) (Oral)  Resp 18  Ht 5' 3"  (1.6 m)  Wt 151 lb (68.493 kg)  BMI 26.76 kg/m2  SpO2 94% Physical Exam  Constitutional: She is oriented to person, place, and time. She appears well-developed and well-nourished. No distress.  HENT:  Head: Normocephalic and atraumatic.  Right Ear: Hearing normal.  Left Ear: Hearing normal.  Nose: Nose normal.  Mouth/Throat: Oropharynx is clear and moist and mucous membranes are normal.  Eyes: Conjunctivae and EOM are normal. Pupils are equal, round, and reactive to light.  Neck: Normal range of motion. Neck supple.  Cardiovascular: Regular rhythm, S1 normal and S2 normal.  Exam reveals no gallop and no friction rub.   No murmur heard. Pulmonary/Chest: Effort normal and breath sounds normal. No respiratory distress. She exhibits no tenderness.  Abdominal: Soft. Normal appearance and bowel sounds are normal. There is no hepatosplenomegaly. There is no tenderness. There is no rebound, no guarding, no tenderness at McBurney's point and negative Murphy's sign. No hernia.  Musculoskeletal: Normal range of motion.  Neurological: She is alert and oriented to person, place, and time. She has normal strength. No cranial nerve deficit or sensory deficit. Coordination normal. GCS eye subscore is 4. GCS verbal subscore is 5. GCS motor subscore is 6.  Skin: Skin is warm, dry and intact. No rash noted. No cyanosis.  Psychiatric: She has  a normal mood and affect. Her speech is normal and behavior is normal. Thought content normal.    ED Course  Procedures (including critical care time) DIAGNOSTIC STUDIES: Oxygen Saturation is 94% on RA, adequate by my interpretation.   COORDINATION OF CARE: 10:04 AM- Will order lab work. Pt verbalizes understanding and agrees to plan.  Medications  ALPRAZolam (XANAX) tablet 0.25 mg (not administered)  atenolol (TENORMIN) tablet 50 mg (50 mg Oral Given 11/27/14 0838)  levothyroxine (SYNTHROID, LEVOTHROID) tablet 25 mcg (25 mcg Oral Given 11/27/14 0839)  levothyroxine (SYNTHROID, LEVOTHROID) tablet 112 mcg (112 mcg Oral Given 11/27/14 0838)  aspirin EC tablet 81 mg (81 mg Oral Given 11/27/14 0837)  cholecalciferol (VITAMIN D) tablet 1,000 Units (1,000 Units Oral Given  11/27/14 9728)  heparin injection 5,000 Units (5,000 Units Subcutaneous Given 11/27/14 0556)  0.9 %  sodium chloride infusion ( Intravenous Rate/Dose Verify 11/27/14 0700)  acetaminophen (TYLENOL) tablet 650 mg (not administered)    Or  acetaminophen (TYLENOL) suppository 650 mg (not administered)  HYDROcodone-acetaminophen (NORCO/VICODIN) 5-325 MG per tablet 1-2 tablet (not administered)  traZODone (DESYREL) tablet 25 mg (25 mg Oral Given 11/26/14 2243)  magnesium hydroxide (MILK OF MAGNESIA) suspension 30 mL (not administered)  bisacodyl (DULCOLAX) suppository 10 mg (not administered)  ondansetron (ZOFRAN) tablet 4 mg (not administered)    Or  ondansetron (ZOFRAN) injection 4 mg (not administered)  alum & mag hydroxide-simeth (MAALOX/MYLANTA) 200-200-20 MG/5ML suspension 30 mL (not administered)  insulin aspart (novoLOG) injection 0-15 Units (0 Units Subcutaneous Not Given 11/27/14 0752)  insulin aspart (novoLOG) injection 0-5 Units (0 Units Subcutaneous Not Given 11/26/14 2200)  feeding supplement (ENSURE ENLIVE) (ENSURE ENLIVE) liquid 237 mL (not administered)  ondansetron (ZOFRAN) injection 4 mg (4 mg Intravenous Given 11/26/14 1109)   pamidronate (AREDIA) 30 mg in sodium chloride 0.9 % 500 mL IVPB (30 mg Intravenous Given 11/26/14 1737)    Labs Review Labs Reviewed  CBC WITH DIFFERENTIAL/PLATELET - Abnormal; Notable for the following:    WBC 14.9 (*)    Neutro Abs 10.7 (*)    Monocytes Absolute 1.4 (*)    All other components within normal limits  COMPREHENSIVE METABOLIC PANEL - Abnormal; Notable for the following:    Chloride 98 (*)    Glucose, Bld 194 (*)    BUN 58 (*)    Creatinine, Ser 2.17 (*)    Calcium 14.5 (*)    ALT 13 (*)    GFR calc non Af Amer 21 (*)    GFR calc Af Amer 24 (*)    All other components within normal limits  URINALYSIS, ROUTINE W REFLEX MICROSCOPIC (NOT AT [facility]) - Abnormal; Notable for the following:    Hgb urine dipstick TRACE (*)    All other components within normal limits  PARATHYROID HORMONE, INTACT (NO CA) - Abnormal; Notable for the following:    PTH 5 (*)    All other components within normal limits  URINE MICROSCOPIC-ADD ON - Abnormal; Notable for the following:    Casts HYALINE CASTS (*)    All other components within normal limits  HEMOGLOBIN A1C - Abnormal; Notable for the following:    Hgb A1c MFr Bld 6.4 (*)    All other components within normal limits  COMPREHENSIVE METABOLIC PANEL - Abnormal; Notable for the following:    Glucose, Bld 138 (*)    BUN 51 (*)    Creatinine, Ser 2.05 (*)    Calcium 12.1 (*)    Total Protein 6.3 (*)    Albumin 3.1 (*)    ALT 12 (*)    GFR calc non Af Amer 22 (*)    GFR calc Af Amer 26 (*)    All other components within normal limits  CBC - Abnormal; Notable for the following:    Hemoglobin 11.9 (*)    All other components within normal limits  GLUCOSE, CAPILLARY - Abnormal; Notable for the following:    Glucose-Capillary 113 (*)    All other components within normal limits  TSH  GLUCOSE, CAPILLARY  CALCIUM, IONIZED  PTH-RELATED PEPTIDE  VITAMIN D 1,25 DIHYDROXY  PROTEIN ELECTROPHORESIS, SERUM    Imaging Review Dg Chest  2 View  11/26/2014   CLINICAL DATA:  Leukocytosis.  EXAM: CHEST  2  VIEW  COMPARISON:  None.  FINDINGS: Mediastinum and hilar structures are normal. Mild right middle lobe subsegmental atelectasis and or infiltrate noted. No pleural effusion or pneumothorax. Heart size normal. Degenerative changes thoracic spine.  IMPRESSION: Mild right middle lobe subsegmental atelectasis and or infiltrate.   Electronically Signed   By: Marcello Moores  Register   On: 11/26/2014 16:59   Dg Bone Survey Met  11/27/2014   CLINICAL DATA:  Hypercalcemia  EXAM: METASTATIC BONE SURVEY  COMPARISON:  Chest x-ray performed today. CT of the abdomen and pelvis 01/02/2014.  FINDINGS: Heart is normal size. Lungs are clear. No effusions or focal thoracic bony abnormality.  No suspicious lytic or bony destructive lesions. No acute bony abnormality through the visualized spine. Degenerative changes in the cervical spine and lower lumbar spine with disc space narrowing and spurring. Degenerative facet disease in the cervical spine and mid to lower lumbar spine.  There is a rounded gas collection noted in the soft tissues adjacent to the left elbow measuring 2 cm of unknown etiology. Recommend clinical correlation. This may be related to attempted IV placement. IV is noted in the right antecubital soft tissues.  IMPRESSION: No suspicious focal bony abnormality or acute bony abnormality.  Soft tissue gas collection in the soft tissues adjacent to the left elbow. Recommend clinical correlation. This may be related to attempted unsuccessful IV placement.   Electronically Signed   By: Rolm Baptise M.D.   On: 11/27/2014 10:17     EKG Interpretation   Date/Time:  Tuesday November 26 2014 10:32:38 EDT Ventricular Rate:  73 PR Interval:  202 QRS Duration: 90 QT Interval:  362 QTC Calculation: 399 R Axis:   52 Text Interpretation:  Sinus rhythm Normal ECG Confirmed by POLLINA  MD,  CHRISTOPHER ([id number]) on 11/26/2014 11:21:33 AM      MDM   Final  diagnoses:  Leukocytosis  Hypercalcemia   AKI  Patient presented to the ER for evaluation of generalized weakness. Patient reports that symptoms have been ongoing for some time, has progressively worsened. She appears well on examination. Vital signs are within normal limits other than very mild hypertension. Blood work performed, however, reveals acute renal insufficiency. She has had a significant pop in her creatinine from most recent value. She also has significant hypercalcemia. Constellation of symptoms and findings today are worrisome for cancer etiology. With renal insufficiency and hypercalcemia, multiple myeloma considered. Will admit for further workup.  I personally performed the services described in this documentation, which was scribed in my presence. The recorded information has been reviewed and is accurate.    Orpah Greek, MD 11/27/14 [phone]

## 2014-11-26 NOTE — H&P (Signed)
[facility] History and Physical  Lisa Sutton [id number] DOB: November 03, 1933 DOA: 11/26/2014  Referring physician: Betsey Holiday PCP: Redge Gainer, MD   Chief Complaint: weakness  HPI: Lisa Sutton is a very pleasant 81 y.o. female with a past medical history that includes diabetes, hypertension, GERD, hypothyroidism, hepatitis B/fatty liver, dysphasia reports to the emergency department with chief complaint of gradual worsening persistent generalized weakness. Evaluation reveals acute renal failure, hypercalcemia. Patient reports decreased appetite and 30 pound weight loss over the last 5-6 weeks. She has seen her primary care provider and is in the process of being referred to a gastroenterologist for this issue. In addition she reports about a month ago her thyroid medicine was adjusted but she was unsure of the specifics of that adjustment. Associated symptoms include intermittent dizziness with position change. She denies any headache visual disturbances, chest pain palpitations, fever chills abdominal pain nausea vomiting diarrhea constipation. She denies dysuria hematuria frequency or urgency. She denies any cough extremity edema or orthopnea. Workup in the emergency department reveals calcium level XIV.5 with a creatinine of 2.17, BUN 58, chloride 98 serum glucose 194 and WBCs 14.9. He has H is 1.41 urinalysis is unremarkable.EKG normal sinus rhythm. The emergency department she is afebrile hemodynamically stable and not hypoxic  Review of Systems:  Point review of systems complete and all systems are negative except as indicated in the history of present illness   Past Medical History  Diagnosis Date  . Type 2 diabetes mellitus   . Essential hypertension, benign   . GERD (gastroesophageal reflux disease)   . Osteoporosis   . Obesity   . Hypothyroidism   . Dysphagia   . Hepatitis B   . Fatty liver   . Kidney stone August 2014    Patient was seen at [facility]   Past  Surgical History  Procedure Laterality Date  . Abdominal hysterectomy    . Deviated septum repair    . Knee arthroscopy      left knee 2003  . Cataract extraction, bilateral     Social History:  reports that she has never smoked. She has never used smokeless tobacco. She reports that she does not drink alcohol or use illicit drugs. Retired she lives with her son she stopped smoking about 20 years ago Allergies  Allergen Reactions  . Zetia [Ezetimibe]   . Livalo [Pitavastatin] Other (See Comments)  . Zocor [Simvastatin] Other (See Comments)    Family History  Problem Relation Age of Onset  . Hypertension Father   . Cancer Maternal Aunt      Prior to Admission medications   Medication Sig Start Date End Date Taking? Authorizing Provider  acetaminophen (TYLENOL) 500 MG tablet Take 1,000 mg by mouth every 8 (eight) hours as needed for mild pain.   Yes Historical Provider, MD  ADVANCED FIBER COMPLEX PO Take by mouth daily. These are gummies   Yes Historical Provider, MD  ALPRAZolam (XANAX) 0.25 MG tablet Take 1 tablet (0.25 mg total) by mouth 2 (two) times daily as needed for anxiety. 05/31/14  Yes [name], MD  aspirin EC 81 MG tablet Take 81 mg by mouth daily.   Yes Historical Provider, MD  atenolol (TENORMIN) 50 MG tablet Take 1 tablet (50 mg total) by mouth daily. 05/31/14  Yes [name], MD  Cholecalciferol (VITAMIN D) 1000 UNITS capsule Take 1,000 Units by mouth daily.     Yes Historical Provider, MD  fish oil-omega-3 fatty acids 1000 MG capsule  Take 1,000 mg by mouth daily.     Yes Historical Provider, MD  hydrocortisone cream 1 % Apply 1 application topically 2 (two) times daily. 05/31/14  Yes [name], MD  levothyroxine (LEVOTHROID) 25 MCG tablet Take 1 tablet (25 mcg total) by mouth daily before breakfast. 05/31/14  Yes [name], MD  levothyroxine (SYNTHROID, LEVOTHROID) 112 MCG tablet Take 1 tablet (112 mcg total) by mouth daily. 05/31/14  Yes [name], MD    lisinopril-hydrochlorothiazide (PRINZIDE,ZESTORETIC) 20-12.5 MG per tablet Take 1 tablet by mouth daily. 05/31/14  Yes [name], MD  meclizine (ANTIVERT) 12.5 MG tablet TAKE  (1)  TABLET  FOUR TIMES DAILY AS NEEDED. Patient taking differently: TAKE  (1)  TABLET  FOUR TIMES DAILY AS NEEDED DIZZINESS 09/06/14  Yes [name], MD  metFORMIN (GLUCOPHAGE) 500 MG tablet TAKE 1 TABLET TWICE DAILY WITH A MEAL 05/31/14  Yes [name], MD  Multiple Vitamin (MULITIVITAMIN WITH MINERALS) TABS Take 1 tablet by mouth daily.    Yes Historical Provider, MD  omeprazole (PRILOSEC) 20 MG capsule Take 1 capsule (20 mg total) by mouth daily. Patient taking differently: Take 20 mg by mouth daily as needed (ACID REFLUX).  05/31/14  Yes [name], MD  glucose blood (ACCU-CHEK AVIVA PLUS) test strip Test BS bid and PRN. Dx E11.9 06/21/14   [name], MD  vitamin B-12 (CYANOCOBALAMIN) 500 MCG tablet Take 500 mcg by mouth 3 (three) times a week.     Historical Provider, MD   Physical Exam: Filed Vitals:   11/26/14 1130 11/26/14 1200 11/26/14 1230 11/26/14 1300  BP: 127/71 145/73 134/74 126/74  Pulse: 71 69 59 64  Temp:      TempSrc:      Resp: 17 19 14 15   Height:      Weight:      SpO2: 94% 92% 94% 91%    Wt Readings from Last 3 Encounters:  11/26/14 68.493 kg (151 lb)  11/15/14 70.761 kg (156 lb)  10/17/14 72.576 kg (160 lb)    General:  Appears calm somewhat lethargic and comfortable Eyes: PERRL, normal lids, irises & conjunctiva ENT: grossly normal hearing, lips & tongue Neck: no LAD, masses or thyromegaly Cardiovascular: RRR, no m/r/g. No LE edema. Telemetry: SR, no arrhythmias  Respiratory: CTA bilaterally, no w/r/r. Normal respiratory effort. Abdomen: soft, ntnd positive bowel sounds Skin: no rash or induration seen on limited exam Musculoskeletal: grossly normal tone BUE/BLE Psychiatric: grossly normal mood and affect, speech fluent and appropriate Neurologic: grossly  non-focal. Each slow and deliberate but clear           Labs on Admission:  Basic Metabolic Panel:  Recent Labs Lab 11/26/14 1002  NA 136  K 4.1  CL 98*  CO2 27  GLUCOSE 194*  BUN 58*  CREATININE 2.17*  CALCIUM 14.5*   Liver Function Tests:  Recent Labs Lab 11/26/14 1002  AST 20  ALT 13*  ALKPHOS 89  BILITOT 0.5  PROT 7.6  ALBUMIN 3.8   No results for input(s): LIPASE, AMYLASE in the last 168 hours. No results for input(s): AMMONIA in the last 168 hours. CBC:  Recent Labs Lab 11/26/14 1002  WBC 14.9*  NEUTROABS 10.7*  HGB 14.0  HCT 41.8  MCV 91.7  PLT 332   Cardiac Enzymes: No results for input(s): CKTOTAL, CKMB, CKMBINDEX, TROPONINI in the last 168 hours.  BNP (last 3 results)  Recent Labs  02/11/14 [id number]  BNP 26.4    ProBNP (last 3 results) No results for input(s): PROBNP in the last 8760 hours.  CBG: No results for input(s): GLUCAP in the last 168 hours.  Radiological Exams on Admission: No results found.  EKG: Independently reviewed. Will sinus rhythm  Assessment/Plan Principal Problem:   AKI (acute kidney injury): Likely related to hypercalcemia/dehydration/decreased by mouth intake in the setting of diarrhetic's.. Will admit to telemetry. Will provide vigorous IV fluids. Will hold her home lisinopril and hydrochlorothiazide and metformin and Lasix. Will monitor urine output.  Active Problems:   Hypercalcemia: Etiology uncertain. Patient states her thyroid medication changed about a month ago.  Over 30 days of unintentional weight loss. Concern for malignancy. Ionized calcium level, parathyroid hormone, PTH related peptide all in process. The lateral vitamin D level. Will provide vigorous IV fluids and pamidronate. Will recheck in the morning. Ultimately will need CT of the chest abdomen and pelvis but given #11 hold off until she can have contrast. Hopefully that will be tomorrow    Leukocytosis: He is afebrile and nontoxic appearing.  Urinalysis is unremarkable. Obtain a chest x-ray. Likely reactive. Will monitor   Essential hypertension, benign: Controlled on admission. On the hold her ACE inhibitor and her diarrhetic's. Will continue atenolol and monitor.   Type 2 diabetes mellitus: Hold her oral agents due to #1. Will obtain a hemoglobin A1c. Will use sliding scale insulin for optimal control. Our modified diet    Weight loss, intentional/  Weakness: Likely related to #1 and #2. He treatment plan for #2.    Fatty liver/ Hepatitis B:  Liver Function tests unremarkable.     Hypothyroid: SH as noted above. Parathyroid pending. Will continue home meds    GERD (gastroesophageal reflux disease): Stable at baseline     Hyperlipidemia: Lipid panel continue her statin    Code Status: full DVT Prophylaxis: Family Communication: none present Disposition Plan: home when ready  Time spent: 59 minutes  [facility] Pager [phone]

## 2014-11-27 ENCOUNTER — Inpatient Hospital Stay (HOSPITAL_COMMUNITY): Payer: Medicare Other

## 2014-11-27 DIAGNOSIS — R9389 Abnormal findings on diagnostic imaging of other specified body structures: Secondary | ICD-10-CM | POA: Diagnosis present

## 2014-11-27 DIAGNOSIS — E43 Unspecified severe protein-calorie malnutrition: Secondary | ICD-10-CM | POA: Insufficient documentation

## 2014-11-27 LAB — PROTEIN ELECTROPHORESIS, SERUM
A/G Ratio: 1 (ref 0.7–1.7)
ALBUMIN ELP: 3.1 g/dL (ref 2.9–4.4)
Alpha-1-Globulin: 0.2 g/dL (ref 0.0–0.4)
Alpha-2-Globulin: 1.1 g/dL — ABNORMAL HIGH (ref 0.4–1.0)
Beta Globulin: 1 g/dL (ref 0.7–1.3)
GAMMA GLOBULIN: 0.9 g/dL (ref 0.4–1.8)
GLOBULIN, TOTAL: 3.2 g/dL (ref 2.2–3.9)
TOTAL PROTEIN ELP: 6.3 g/dL (ref 6.0–8.5)

## 2014-11-27 LAB — GLUCOSE, CAPILLARY
GLUCOSE-CAPILLARY: 113 mg/dL — AB (ref 65–99)
Glucose-Capillary: 123 mg/dL — ABNORMAL HIGH (ref 65–99)
Glucose-Capillary: 134 mg/dL — ABNORMAL HIGH (ref 65–99)
Glucose-Capillary: 104 mg/dL — ABNORMAL HIGH (ref 65–99)
Glucose-Capillary: 110 mg/dL — ABNORMAL HIGH (ref 65–99)

## 2014-11-27 LAB — COMPREHENSIVE METABOLIC PANEL
ALBUMIN: 3.1 g/dL — AB (ref 3.5–5.0)
ALK PHOS: 71 U/L (ref 38–126)
ALT: 12 U/L — AB (ref 14–54)
AST: 15 U/L (ref 15–41)
Anion gap: 6 (ref 5–15)
BILIRUBIN TOTAL: 0.6 mg/dL (ref 0.3–1.2)
BUN: 51 mg/dL — ABNORMAL HIGH (ref 6–20)
CHLORIDE: 102 mmol/L (ref 101–111)
CO2: 29 mmol/L (ref 22–32)
CREATININE: 2.05 mg/dL — AB (ref 0.44–1.00)
Calcium: 12.1 mg/dL — ABNORMAL HIGH (ref 8.9–10.3)
GFR calc Af Amer: 26 mL/min — ABNORMAL LOW (ref >60)
GFR calc non Af Amer: 22 mL/min — ABNORMAL LOW (ref >60)
Glucose, Bld: 138 mg/dL — ABNORMAL HIGH (ref 65–99)
Potassium: 3.9 mmol/L (ref 3.5–5.1)
Sodium: 137 mmol/L (ref 135–145)
Total Protein: 6.3 g/dL — ABNORMAL LOW (ref 6.5–8.1)

## 2014-11-27 LAB — CBC
HCT: 36.8 % (ref 36.0–46.0)
Hemoglobin: 11.9 g/dL — ABNORMAL LOW (ref 12.0–15.0)
MCH: 30.1 pg (ref 26.0–34.0)
MCHC: 32.3 g/dL (ref 30.0–36.0)
MCV: 92.9 fL (ref 78.0–100.0)
Platelets: 261 10*3/uL (ref 150–400)
RBC: 3.96 MIL/uL (ref 3.87–5.11)
RDW: 12.9 % (ref 11.5–15.5)
WBC: 7.9 10*3/uL (ref 4.0–10.5)

## 2014-11-27 LAB — HEMOGLOBIN A1C
Hgb A1c MFr Bld: 6.4 % — ABNORMAL HIGH (ref 4.8–5.6)
Mean Plasma Glucose: 137 mg/dL

## 2014-11-27 LAB — CALCIUM, IONIZED: CALCIUM, IONIZED, SERUM: 7.9 mg/dL — AB (ref 4.5–5.6)

## 2014-11-27 LAB — PARATHYROID HORMONE, INTACT (NO CA): PTH: 5 pg/mL — ABNORMAL LOW (ref 15–65)

## 2014-11-27 NOTE — Care Management Note (Signed)
Case Management Note  Patient Details  Name: Lisa Sutton MRN: [id number] Date of Birth: February 02, 1934  Subjective/Objective:                  Pt admitted from home with AKI. Pt lives with her son and will return home at discharge. Pt is independent with ADL's.  Action/Plan: No Cm needs noted.  Expected Discharge Date:  11/28/14               Expected Discharge Plan:  Home/Self Care  In-House Referral:  NA  Discharge planning Services  CM Consult  Post Acute Care Choice:  NA Choice offered to:  NA  DME Arranged:    DME Agency:     HH Arranged:    HH Agency:     Status of Service:  Completed, signed off  Medicare Important Message Given:    Date Medicare IM Given:    Medicare IM give by:    Date Additional Medicare IM Given:    Additional Medicare Important Message give by:     If discussed at [place] of Stay Meetings, dates discussed:    Additional Comments:  Joylene Draft, RN 11/27/2014, 11:55 AM

## 2014-11-27 NOTE — Progress Notes (Signed)
Initial Nutrition Assessment  DOCUMENTATION CODES: Severe malnutrition in context of chronic illness  INTERVENTION: Ensure Enlive BID (each supplement provides 350kcal and 20 grams of protein)   Continue MVI w/o mineral  NUTRITION DIAGNOSIS: Inadequate oral intake related to poor appetite as evidenced by loss of >15% bw in 6 months  GOAL: Patient will meet greater than or equal to 90% of their needs  MONITOR: PO intake, Labs, I & O's, Supplement acceptance, Work up  REASON FOR ASSESSMENT: Malnutrition Screening Tool    ASSESSMENT: 81 y.o. female PMHx DM, HTN, GERD, hypothyroidism, hepatitis B/fatty liver, dysphasia reports to the emergency department with chief complaint of  generalized weakness. Evaluation reveals acute renal failure, hypercalcemia. Reports decreased appetite and 30 pound weight loss over the last 5-6 weeks.   Pt reports that in the last 6 months pt has not had much of an appetite, she is unsure why. She confirms losing 28#s in 6 months (18 in last 6 weeks)- she is not sure if the poor appt was the only reason for her wt loss which is why she came to the hospital.  She is a non-insulin taking diabetic. She says she watches her sweets somewhat, but it sounds like she doesn't follow any strict DM diet. Reports she checks cbgs and they range from 110-115. She takes many vitamins: mvi, mag, calcium, vit D.   This morning her appetite rebounded a little and she said she ate all of her breakfast. Declined snacks at this time.   Labs reviewed: Calcium: 12.1, Decreased renal function, CBGs 81-138. Hypoalbuminemia   Height: Ht Readings from Last 1 Encounters:  11/26/14 5\' 3"  (1.6 m)    Weight: Wt Readings from Last 1 Encounters:  11/27/14 154 lb 9.6 oz (70.126 kg)    Ideal Body Weight:  52.3 kg  Wt Readings from Last 10 Encounters:  11/27/14 154 lb 9.6 oz (70.126 kg)  11/15/14 156 lb (70.761 kg)  10/17/14 160 lb (72.576 kg)  05/31/14 179 lb (81.194 kg)   03/12/14 182 lb 6.4 oz (82.736 kg)  02/20/14 183 lb 3.2 oz (83.099 kg)  02/11/14 186 lb 9.6 oz (84.641 kg)  02/04/14 185 lb (83.915 kg)  12/14/13 178 lb (80.74 kg)  12/13/13 178 lb 9.6 oz (81.012 kg)  Admit weight 151 lbs (68.6 kg)  BMI:  Body mass index is 27.39 kg/(m^2).  Estimated Nutritional Needs: Kcal:  1500-1650 (22-24 kcal/kg) Protein:  57-68 (1-1.3 g/kg ibw) Fluid:  1.5-1.68 liters  Skin:  Reviewed, no issues  Diet Order:  Diet heart healthy/carb modified Room service appropriate?: Yes; Fluid consistency:: Thin  EDUCATION NEEDS:  No education needs identified at this time   Intake/Output Summary (Last 24 hours) at 11/27/14 1154 Last data filed at 11/27/14 0700  Gross per 24 hour  Intake 1293.75 ml  Output   1000 ml  Net 293.75 ml   Last BM:  Unknown  Burtis Junes RD, LDN Nutrition Pager: [phone] 11/27/2014 11:54 AM

## 2014-11-27 NOTE — Progress Notes (Signed)
[facility] PROGRESS NOTE  Lisa Sutton [id number] DOB: 1934-02-20 DOA: 11/26/2014 PCP: Redge Gainer, MD  Assessment/Plan: AKI (acute kidney injury): Likely related to hypercalcemia/dehydration/decreased by mouth intake in the setting of diarrhetic's. Creatinine trending downward slightly. Will continue vigorous IV fluids and holding her home lisinopril and hydrochlorothiazide and metformin and Lasix. Urine output good. Recheck in am   Active Problems:  Hypercalcemia: slight improvement this am s/p  1 dose of pamidronate. Etiology uncertain but concern for malignancy. Hx retroperitoneal lymphadenopathy. Ionized calcium level in process, parathyroid hormone 5, PTH related peptide in process. Vitamin D level pending. TSH 1.4 down from 8.3 4 months ago. Will continue vigorous IV fluids. Will recheck in the morning. Await DG bones survey.  Ultimately will need CT of the chest abdomen and pelvis but given #1 holding off until she can have contrast. Hopefully that will be tomorrow   Leukocytosis: resolved this am.  She remains afebrile and nontoxic appearing. Urinalysis is unremarkable. Chest x-ray mild right middle lobe subsegmental atelectasis and or infiltrate. Oxygen saturation level >90% on room air. Will mobilize and provide incentive spirometry.  Will monitor  Essential hypertension, benign: Controlled.  Holding her ACE inhibitor and her lasix and HCTZ. Will continue atenolol and monitor.  Type 2 diabetes mellitus: Holding her oral agents due to #1. Hemoglobin A1c 6.4. CBG range 81-113. Continue scale insulin for optimal control. carb modified diet   Weight loss,unintentional/ Weakness: Likely related to #1 and #2. see treatment plan for #2.   Fatty liver/ Hepatitis B: Liver Function tests unremarkable.   Hypothyroid: TSH as noted above. Parathyroid pending. Will continue home meds   GERD (gastroesophageal reflux disease): Stable at baseline   Hyperlipidemia:  Lipid panel continue her statin   Code Status: full Family Communication: none present Disposition Plan: home when ready hopefully 24-48 hours   Consultants:  none  Procedures:  none  Antibiotics:  none  HPI/Subjective: Lying in bed. Reports resting well and feeling "a little stronger" denies pain/discomfort  Objective: Filed Vitals:   11/27/14 0531  BP: 131/70  Pulse: 60  Temp: 98.2 F (36.8 C)  Resp: 20    Intake/Output Summary (Last 24 hours) at 11/27/14 0957 Last data filed at 11/27/14 0700  Gross per 24 hour  Intake 1293.75 ml  Output   1000 ml  Net 293.75 ml   Filed Weights   11/26/14 0953 11/27/14 0546  Weight: 68.493 kg (151 lb) 70.126 kg (154 lb 9.6 oz)    Exam:   General:  Well nourished appears comfortable  Cardiovascular: RRR no MGR no LE edema  Respiratory: normal effort BS somewhat distant but clear no wheeze no rales  Abdomen: obese soft +BS non-tender to palpation  Musculoskeletal: no clubbing or cyanosis  Neuro: alert oriented x3. Speech clear  Data Reviewed: Basic Metabolic Panel:  Recent Labs Lab 11/26/14 1002 11/27/14 0637  NA 136 137  K 4.1 3.9  CL 98* 102  CO2 27 29  GLUCOSE 194* 138*  BUN 58* 51*  CREATININE 2.17* 2.05*  CALCIUM 14.5* 12.1*   Liver Function Tests:  Recent Labs Lab 11/26/14 1002 11/27/14 0637  AST 20 15  ALT 13* 12*  ALKPHOS 89 71  BILITOT 0.5 0.6  PROT 7.6 6.3*  ALBUMIN 3.8 3.1*   No results for input(s): LIPASE, AMYLASE in the last 168 hours. No results for input(s): AMMONIA in the last 168 hours. CBC:  Recent Labs Lab 11/26/14 1002 11/27/14 0637  WBC 14.9* 7.9  NEUTROABS 10.7*  --  HGB 14.0 11.9*  HCT 41.8 36.8  MCV 91.7 92.9  PLT 332 261   Cardiac Enzymes: No results for input(s): CKTOTAL, CKMB, CKMBINDEX, TROPONINI in the last 168 hours. BNP (last 3 results)  Recent Labs  02/11/14 0936  BNP 26.4    ProBNP (last 3 results) No results for input(s): PROBNP in  the last 8760 hours.  CBG:  Recent Labs Lab 11/26/14 2155 11/27/14 0733  GLUCAP 81 113*    No results found for this or any previous visit (from the past 240 hour(s)).   Studies: Dg Chest 2 View  11/26/2014   CLINICAL DATA:  Leukocytosis.  EXAM: CHEST  2 VIEW  COMPARISON:  None.  FINDINGS: Mediastinum and hilar structures are normal. Mild right middle lobe subsegmental atelectasis and or infiltrate noted. No pleural effusion or pneumothorax. Heart size normal. Degenerative changes thoracic spine.  IMPRESSION: Mild right middle lobe subsegmental atelectasis and or infiltrate.   Electronically Signed   By: Marcello Moores  Register   On: 11/26/2014 16:59    Scheduled Meds: . aspirin EC  81 mg Oral Daily  . atenolol  50 mg Oral Daily  . cholecalciferol  1,000 Units Oral Daily  . feeding supplement (ENSURE ENLIVE)  237 mL Oral BID BM  . heparin  5,000 Units Subcutaneous 3 times per day  . insulin aspart  0-15 Units Subcutaneous TID WC  . insulin aspart  0-5 Units Subcutaneous QHS  . levothyroxine  112 mcg Oral QAC breakfast  . levothyroxine  25 mcg Oral QAC breakfast   Continuous Infusions: . sodium chloride 125 mL/hr at 11/27/14 0700    Principal Problem:   AKI (acute kidney injury) Active Problems:   Fatty liver   Hepatitis B   Type 2 diabetes mellitus   Hypothyroid   GERD (gastroesophageal reflux disease)   Essential hypertension, benign   Hyperlipidemia   Hypercalcemia   Leukocytosis   Weight loss   Weakness    Time spent: 30 minutes    [facility] Pager [phone]. If 7PM-7AM, please contact night-coverage at [email or web address], password [facility] 11/27/2014, 9:57 AM  LOS: 1 day

## 2014-11-28 LAB — CBC
HCT: 33.6 % — ABNORMAL LOW (ref 36.0–46.0)
HEMOGLOBIN: 11 g/dL — AB (ref 12.0–15.0)
MCH: 30.1 pg (ref 26.0–34.0)
MCHC: 32.7 g/dL (ref 30.0–36.0)
MCV: 91.8 fL (ref 78.0–100.0)
Platelets: 246 10*3/uL (ref 150–400)
RBC: 3.66 MIL/uL — ABNORMAL LOW (ref 3.87–5.11)
RDW: 12.9 % (ref 11.5–15.5)
WBC: 6.5 10*3/uL (ref 4.0–10.5)

## 2014-11-28 LAB — GLUCOSE, CAPILLARY: GLUCOSE-CAPILLARY: 102 mg/dL — AB (ref 65–99)

## 2014-11-28 LAB — COMPREHENSIVE METABOLIC PANEL
ALBUMIN: 2.9 g/dL — AB (ref 3.5–5.0)
ALT: 11 U/L — ABNORMAL LOW (ref 14–54)
AST: 15 U/L (ref 15–41)
Alkaline Phosphatase: 68 U/L (ref 38–126)
Anion gap: 5 (ref 5–15)
BUN: 42 mg/dL — ABNORMAL HIGH (ref 6–20)
CALCIUM: 10.1 mg/dL (ref 8.9–10.3)
CO2: 27 mmol/L (ref 22–32)
Chloride: 108 mmol/L (ref 101–111)
Creatinine, Ser: 1.81 mg/dL — ABNORMAL HIGH (ref 0.44–1.00)
GFR calc Af Amer: 30 mL/min — ABNORMAL LOW (ref >60)
GFR calc non Af Amer: 26 mL/min — ABNORMAL LOW (ref >60)
Glucose, Bld: 105 mg/dL — ABNORMAL HIGH (ref 65–99)
POTASSIUM: 3.8 mmol/L (ref 3.5–5.1)
Sodium: 140 mmol/L (ref 135–145)
Total Bilirubin: 0.4 mg/dL (ref 0.3–1.2)
Total Protein: 5.8 g/dL — ABNORMAL LOW (ref 6.5–8.1)

## 2014-11-28 NOTE — Care Management (Signed)
Important Message  Patient Details  Name: Lisa Sutton MRN: [id number] Date of Birth: Aug 23, 1933   Medicare Important Message Given:  N/A - LOS <3 / Initial given by admissions    Joylene Draft, RN 11/28/2014, 10:25 AM

## 2014-11-28 NOTE — Discharge Summary (Signed)
Physician Discharge Summary  Lisa Sutton QZE:[id number] DOB: November 05, 1933 DOA: 11/26/2014  PCP: Redge Gainer, MD  Admit date: 11/26/2014 Discharge date: 11/28/2014  Time spent: 40 minutes  Recommendations for Outpatient Follow-up:  1. Has appointment 12/02/14 with PCP for evaluation of calcium level, kidney function, DM control (metformin stopped at discharge) and op work up for hypercalcemia  Discharge Diagnoses:  Principal Problem:   AKI (acute kidney injury) Active Problems:   Fatty liver   Hepatitis B   Type 2 diabetes mellitus   Hypothyroid   GERD (gastroesophageal reflux disease)   Essential hypertension, benign   Hyperlipidemia   Hypercalcemia   Leukocytosis   Weight loss   Weakness   Abnormal chest x-ray   Protein-calorie malnutrition, severe   Discharge Condition: stable  Diet recommendation: carb modified  Filed Weights   11/26/14 0953 11/27/14 0546 11/28/14 0655  Weight: 68.493 kg (151 lb) 70.126 kg (154 lb 9.6 oz) 71.895 kg (158 lb 8 oz)    History of present illness:  Lisa Sutton is a very pleasant 81 y.o. female with a past medical history that includes diabetes, hypertension, GERD, hypothyroidism, hepatitis B/fatty liver, dysphasia reported to the emergency department on 11/26/14 with chief complaint of gradual worsening persistent generalized weakness. Evaluation revealed acute renal failure, hypercalcemia.  Patient reported decreased appetite and 30 pound weight loss over the last 5-6 weeks. She had seen her primary care provider and was in the process of being referred to a gastroenterologist for this issue. In addition she reported about a month prior her thyroid medicine was adjusted but she was unsure of the specifics of that adjustment. Associated symptoms included intermittent dizziness with position change. She denied any headache visual disturbances, chest pain palpitations, fever chills abdominal pain nausea vomiting diarrhea constipation. She  denied dysuria hematuria frequency or urgency. She denied any cough extremity edema or orthopnea.  Workup in the emergency department revealed calcium level 14.5 with a creatinine of 2.17, BUN 58, chloride 98 serum glucose 194 and WBCs 14.9. TSH is 1.41 urinalysis  Unremarkable. EKG normal sinus rhythm. The emergency department she was afebrile hemodynamically stable and not hypoxic  Hospital Course:  AKI (acute kidney injury): Likely related to hypercalcemia/dehydration/decreased by mouth intake in the setting of diarrhetic's. Creatinine 1.8 at discharge down from 2.17 on admission. Urine output remained good. Has follow up with PCP 12/02/14 for evaluation of kidney function.  Active Problems:  Hypercalcemia: resolved at discharge. Received vigorous IV fluids and 1 dose of pamidronate. Etiology uncertain but concern for malignancy. Hx retroperitoneal lymphadenopathy. Ionized calcium level 7.9, parathyroid hormone 5, PTH related peptide in process. Vitamin D level pending. TSH 1.4 down from 8.3 4 months ago.  DG bones survey no suspicious focal bony abnormality or acute bony abnormality. Soft tissue gas collection in the soft tissues adjacent to the left elbow. Ultimately will need CT of the chest abdomen and pelvis. deffered to OP work up due to #1. Has appointment with PCP 12/02/14.   Leukocytosis: resolved.  Essential hypertension, benign: Controlled. .  Type 2 diabetes mellitus: Oral agents discontinued at discharge due to #1. Hemoglobin A1c 6.4. CBG range 81-113. Follow up with PCP as scheduled   Weight loss,unintentional/ Weakness: Likely related to #1 and #2. see treatment plan for #2.   Fatty liver/ Hepatitis B: Liver Function tests unremarkable.   Hypothyroid: TSH as noted above. Parathyroid pending.    GERD (gastroesophageal reflux disease): Stable at baseline   Hyperlipidemia: Lipid panel continue her  statin  Procedures:  none  Consultations:  none  Discharge Exam: Filed Vitals:   11/28/14 0655  BP: 157/91  Pulse: 63  Temp: 98.1 F (36.7 C)  Resp: 20    General: up in chair well nourished appears comfortable Cardiovascular: RRR no m/g/r no Le edema Respiratory: normal effort BS clear bilaterally no wheeze  Discharge Instructions   Discharge Instructions    Diet - low sodium heart healthy    Complete by:  As directed      Discharge instructions    Complete by:  As directed   Follow up with PCP as scheduled Document daily blood sugars and take to PCP appointment     Increase activity slowly    Complete by:  As directed           Current Discharge Medication List    CONTINUE these medications which have NOT CHANGED   Details  acetaminophen (TYLENOL) 500 MG tablet Take 1,000 mg by mouth every 8 (eight) hours as needed for mild pain.    ADVANCED FIBER COMPLEX PO Take by mouth daily. These are gummies    ALPRAZolam (XANAX) 0.25 MG tablet Take 1 tablet (0.25 mg total) by mouth 2 (two) times daily as needed for anxiety. Qty: 180 tablet, Refills: 1    aspirin EC 81 MG tablet Take 81 mg by mouth daily.   Associated Diagnoses: Lymphadenopathy    atenolol (TENORMIN) 50 MG tablet Take 1 tablet (50 mg total) by mouth daily. Qty: 90 tablet, Refills: 3    Cholecalciferol (VITAMIN D) 1000 UNITS capsule Take 1,000 Units by mouth daily.     Associated Diagnoses: Lymphadenopathy    fish oil-omega-3 fatty acids 1000 MG capsule Take 1,000 mg by mouth daily.     Associated Diagnoses: Lymphadenopathy    hydrocortisone cream 1 % Apply 1 application topically 2 (two) times daily. Qty: 30 g, Refills: 2    !! levothyroxine (LEVOTHROID) 25 MCG tablet Take 1 tablet (25 mcg total) by mouth daily before breakfast. Qty: 90 tablet, Refills: 3    !! levothyroxine (SYNTHROID, LEVOTHROID) 112 MCG tablet Take 1 tablet (112 mcg total) by mouth daily. Qty: 90 tablet, Refills: 3    Associated Diagnoses: Lymphadenopathy    lisinopril-hydrochlorothiazide (PRINZIDE,ZESTORETIC) 20-12.5 MG per tablet Take 1 tablet by mouth daily. Qty: 90 tablet, Refills: 3    meclizine (ANTIVERT) 12.5 MG tablet TAKE  (1)  TABLET  FOUR TIMES DAILY AS NEEDED. Qty: 30 tablet, Refills: 0    Multiple Vitamin (MULITIVITAMIN WITH MINERALS) TABS Take 1 tablet by mouth daily.     omeprazole (PRILOSEC) 20 MG capsule Take 1 capsule (20 mg total) by mouth daily. Qty: 90 capsule, Refills: 3   Associated Diagnoses: Gastroesophageal reflux disease without esophagitis    glucose blood (ACCU-CHEK AVIVA PLUS) test strip Test BS bid and PRN. Dx E11.9 Qty: 300 each, Refills: 3    vitamin B-12 (CYANOCOBALAMIN) 500 MCG tablet Take 500 mcg by mouth 3 (three) times a week.      !! - Potential duplicate medications found. Please discuss with provider.    STOP taking these medications     metFORMIN (GLUCOPHAGE) 500 MG tablet        Allergies  Allergen Reactions  . Zetia [Ezetimibe]   . Livalo [Pitavastatin] Other (See Comments)  . Zocor [Simvastatin] Other (See Comments)   Follow-up Information    Follow up with Redge Gainer, MD On 12/02/2014.   Specialty:  Family Medicine   Why:  appointment at 11:45  to check kidney function and calcium level   Contact information:   [place] [id number] [phone]        The results of significant diagnostics from this hospitalization (including imaging, microbiology, ancillary and laboratory) are listed below for reference.    Significant Diagnostic Studies: Dg Chest 2 View  11/26/2014   CLINICAL DATA:  Leukocytosis.  EXAM: CHEST  2 VIEW  COMPARISON:  None.  FINDINGS: Mediastinum and hilar structures are normal. Mild right middle lobe subsegmental atelectasis and or infiltrate noted. No pleural effusion or pneumothorax. Heart size normal. Degenerative changes thoracic spine.  IMPRESSION: Mild right middle lobe subsegmental atelectasis and  or infiltrate.   Electronically Signed   By: Marcello Moores  Register   On: 11/26/2014 16:59   Mr Shoulder Right Wo Contrast  11/16/2014   CLINICAL DATA:  Right shoulder pain and limited range of motion 2 months.  EXAM: MRI OF THE RIGHT SHOULDER WITHOUT CONTRAST  TECHNIQUE: Multiplanar, multisequence MR imaging of the shoulder was performed. No intravenous contrast was administered.  COMPARISON:  None.  FINDINGS: Rotator cuff: There is a full-thickness 1 x 2 cm tear of the distal supraspinatus tendon. There is extensive degeneration of the distal infraspinatus tendon without a tear. There is a focal intrasubstance tear at the musculotendinous junction of the supraspinatus. Subscapularis and teres minor tendons are normal.  Muscles:  No atrophy or edema.  Biceps long head: Properly located and intact. Slight degenerative changes of biceps tendon in the proximal bicipital groove.  Acromioclavicular Joint: Minimal degenerative changes. Slight type 3 acromion which could predispose to impingement.  Glenohumeral Joint: Moderate joint effusion with with fluid extending through the rotator cuff tear into the subacromial/ subdeltoid bursa.  Labrum:  Normal.  Bones:  Normal.  IMPRESSION: Full-thickness tear of the distal supraspinatus tendon. Musculotendinous intrasubstance tear of the supraspinatus tendon.  Degeneration of the distal infraspinatus tendon. Slight type 3 acromion which could predispose to impingement.   Electronically Signed   By: Lorriane Shire M.D.   On: 11/16/2014 17:01   Dg Bone Survey Met  11/27/2014   CLINICAL DATA:  Hypercalcemia  EXAM: METASTATIC BONE SURVEY  COMPARISON:  Chest x-ray performed today. CT of the abdomen and pelvis 01/02/2014.  FINDINGS: Heart is normal size. Lungs are clear. No effusions or focal thoracic bony abnormality.  No suspicious lytic or bony destructive lesions. No acute bony abnormality through the visualized spine. Degenerative changes in the cervical spine and lower lumbar  spine with disc space narrowing and spurring. Degenerative facet disease in the cervical spine and mid to lower lumbar spine.  There is a rounded gas collection noted in the soft tissues adjacent to the left elbow measuring 2 cm of unknown etiology. Recommend clinical correlation. This may be related to attempted IV placement. IV is noted in the right antecubital soft tissues.  IMPRESSION: No suspicious focal bony abnormality or acute bony abnormality.  Soft tissue gas collection in the soft tissues adjacent to the left elbow. Recommend clinical correlation. This may be related to attempted unsuccessful IV placement.   Electronically Signed   By: Rolm Baptise M.D.   On: 11/27/2014 10:17    Microbiology: No results found for this or any previous visit (from the past 240 hour(s)).   Labs: Basic Metabolic Panel:  Recent Labs Lab 11/26/14 1002 11/27/14 0637 11/28/14 0614  NA 136 137 140  K 4.1 3.9 3.8  CL 98* 102 108  CO2 27 29 27   GLUCOSE 194*  138* 105*  BUN 58* 51* 42*  CREATININE 2.17* 2.05* 1.81*  CALCIUM 14.5* 12.1* 10.1   Liver Function Tests:  Recent Labs Lab 11/26/14 1002 11/27/14 0637 11/28/14 0614  AST 20 15 15   ALT 13* 12* 11*  ALKPHOS 89 71 68  BILITOT 0.5 0.6 0.4  PROT 7.6 6.3* 5.8*  ALBUMIN 3.8 3.1* 2.9*   No results for input(s): LIPASE, AMYLASE in the last 168 hours. No results for input(s): AMMONIA in the last 168 hours. CBC:  Recent Labs Lab 11/26/14 1002 11/27/14 0637 11/28/14 0614  WBC 14.9* 7.9 6.5  NEUTROABS 10.7*  --   --   HGB 14.0 11.9* 11.0*  HCT 41.8 36.8 33.6*  MCV 91.7 92.9 91.8  PLT 332 261 246   Cardiac Enzymes: No results for input(s): CKTOTAL, CKMB, CKMBINDEX, TROPONINI in the last 168 hours. BNP: BNP (last 3 results)  Recent Labs  02/11/14 0936  BNP 26.4    ProBNP (last 3 results) No results for input(s): PROBNP in the last 8760 hours.  CBG:  Recent Labs Lab 11/27/14 0733 11/27/14 1158 11/27/14 1703 11/27/14 2301  11/28/14 0732  GLUCAP 113* 134* 104* 110* 102*       Signed:  Dyanne Carrel M  [facility] 11/28/2014, 10:24 AM

## 2014-11-28 NOTE — Progress Notes (Signed)
Discharge instruction reviewed with patient. No distress noted. Taken to lobby via wheelchair.

## 2014-11-28 NOTE — Care Management Note (Signed)
Case Management Note  Patient Details  Name: Lisa Sutton MRN: [id number] Date of Birth: 1934/03/23  Subjective/Objective:                    Action/Plan:   Expected Discharge Date:  11/28/14               Expected Discharge Plan:  Home/Self Care  In-House Referral:  NA  Discharge planning Services  CM Consult  Post Acute Care Choice:  NA Choice offered to:  NA  DME Arranged:    DME Agency:     HH Arranged:    HH Agency:     Status of Service:  Completed, signed off  Medicare Important Message Given:    Date Medicare IM Given:    Medicare IM give by:    Date Additional Medicare IM Given:    Additional Medicare Important Message give by:     If discussed at [place] of Stay Meetings, dates discussed:    Additional Comments: Pt discharged home today. No CM needs noted. Christinia Gully Effie, RN 11/28/2014, 10:25 AM

## 2014-11-30 LAB — PTH-RELATED PEPTIDE: PTH-related peptide: <0.74 pmol/L

## 2014-12-01 LAB — VITAMIN D 1,25 DIHYDROXY
VITAMIN D 1, 25 (OH) TOTAL: 43 pg/mL
Vitamin D3 1, 25 (OH)2: 42 pg/mL

## 2014-12-02 ENCOUNTER — Ambulatory Visit (INDEPENDENT_AMBULATORY_CARE_PROVIDER_SITE_OTHER): Payer: Medicare Other | Admitting: Family Medicine

## 2014-12-02 ENCOUNTER — Encounter: Payer: Self-pay | Admitting: Family Medicine

## 2014-12-02 ENCOUNTER — Ambulatory Visit (INDEPENDENT_AMBULATORY_CARE_PROVIDER_SITE_OTHER): Payer: Medicare Other | Admitting: Internal Medicine

## 2014-12-02 DIAGNOSIS — E119 Type 2 diabetes mellitus without complications: Secondary | ICD-10-CM

## 2014-12-02 DIAGNOSIS — R7989 Other specified abnormal findings of blood chemistry: Secondary | ICD-10-CM | POA: Diagnosis not present

## 2014-12-02 DIAGNOSIS — E039 Hypothyroidism, unspecified: Secondary | ICD-10-CM | POA: Diagnosis not present

## 2014-12-02 MED ORDER — MECLIZINE HCL 12.5 MG PO TABS: ORAL_TABLET | ORAL | Status: DC

## 2014-12-02 NOTE — Progress Notes (Signed)
Subjective:    Patient ID: Lisa Sutton, female    DOB: 25-Jul-1933, 81 y.o.   MRN: [id number]  HPI 81 year old female comes in today for hospital follow up. She was found to have a low PTH and hypercalcemia. The patient was recently admitted to the hospital for symptomatic hypercalcemia. At that time she had a normal PTH. She was treated and her calcium level was normalized and she felt better. We are going to see her today and repeat her PTH level and and a BMP. She is been taken off of metformin. She has a past history of smoking and we will also arrange for her to have a CT scan of the chest. We will arrange for her to have follow-up with an endocrinologist because of the hypercalcemia and to work this problem up further. Over time. She has had weight loss. The weight today is 156. In May her weight was 160. Since her calcium has been better regulated she is actually gained some weight from the hospital and has a better appetite.   Patient Active Problem List   Diagnosis Date Noted  . Abnormal chest x-ray 11/27/2014  . Protein-calorie malnutrition, severe 11/27/2014  . AKI (acute kidney injury) 11/26/2014  . Hypercalcemia 11/26/2014  . Leukocytosis 11/26/2014  . Weight loss 11/26/2014  . Weakness 11/26/2014  . Insomnia 04/12/2013  . Hyperlipidemia 04/12/2013  . Essential hypertension, benign 10/13/2012  . GERD (gastroesophageal reflux disease) 04/25/2012  . Fatty liver 08/09/2011  . Hepatitis B 08/09/2011  . Dysphagia 08/09/2011  . Type 2 diabetes mellitus 08/09/2011  . Hypothyroid 08/09/2011   Outpatient Encounter Prescriptions as of 12/02/2014  Medication Sig  . acetaminophen (TYLENOL) 500 MG tablet Take 1,000 mg by mouth every 8 (eight) hours as needed for mild pain.  Marland Kitchen ADVANCED FIBER COMPLEX PO Take by mouth daily. These are gummies  . ALPRAZolam (XANAX) 0.25 MG tablet Take 1 tablet (0.25 mg total) by mouth 2 (two) times daily as needed for anxiety.  Marland Kitchen aspirin EC 81 MG  tablet Take 81 mg by mouth daily.  Marland Kitchen atenolol (TENORMIN) 50 MG tablet Take 1 tablet (50 mg total) by mouth daily.  . Cholecalciferol (VITAMIN D) 1000 UNITS capsule Take 1,000 Units by mouth daily.    . fish oil-omega-3 fatty acids 1000 MG capsule Take 1,000 mg by mouth daily.    Marland Kitchen glucose blood (ACCU-CHEK AVIVA PLUS) test strip Test BS bid and PRN. Dx E11.9  . hydrocortisone cream 1 % Apply 1 application topically 2 (two) times daily.  Marland Kitchen levothyroxine (LEVOTHROID) 25 MCG tablet Take 1 tablet (25 mcg total) by mouth daily before breakfast.  . levothyroxine (SYNTHROID, LEVOTHROID) 112 MCG tablet Take 1 tablet (112 mcg total) by mouth daily.  Marland Kitchen lisinopril-hydrochlorothiazide (PRINZIDE,ZESTORETIC) 20-12.5 MG per tablet Take 1 tablet by mouth daily.  . meclizine (ANTIVERT) 12.5 MG tablet TAKE  (1)  TABLET  FOUR TIMES DAILY AS NEEDED DIZZINESS  . Multiple Vitamin (MULITIVITAMIN WITH MINERALS) TABS Take 1 tablet by mouth daily.   Marland Kitchen omeprazole (PRILOSEC) 20 MG capsule Take 1 capsule (20 mg total) by mouth daily. (Patient taking differently: Take 20 mg by mouth daily as needed (ACID REFLUX). )  . vitamin B-12 (CYANOCOBALAMIN) 500 MCG tablet Take 500 mcg by mouth 3 (three) times a week.   . [DISCONTINUED] meclizine (ANTIVERT) 12.5 MG tablet TAKE  (1)  TABLET  FOUR TIMES DAILY AS NEEDED. (Patient taking differently: TAKE  (1)  TABLET  FOUR TIMES DAILY  AS NEEDED DIZZINESS)   No facility-administered encounter medications on file as of 12/02/2014.       Review of Systems  Constitutional: Positive for fatigue and unexpected weight change (weight loss).  HENT: Negative.   Eyes: Negative.   Respiratory: Negative.   Cardiovascular: Negative.   Gastrointestinal: Positive for diarrhea and constipation.       Alternating  Endocrine: Negative.   Genitourinary: Negative.   Musculoskeletal: Negative.   Skin: Negative.   Allergic/Immunologic: Negative.   Neurological: Negative.   Hematological: Negative.     Psychiatric/Behavioral: Negative.        Objective:   Physical Exam  Constitutional: She is oriented to person, place, and time. She appears well-developed and well-nourished. No distress.  A shunt is alert and feeling better since her hospital admission and since she says her serum calcium has come down.  HENT:  Head: Normocephalic and atraumatic.  Eyes: Conjunctivae and EOM are normal. Pupils are equal, round, and reactive to light. Right eye exhibits no discharge. Left eye exhibits no discharge. No scleral icterus.  Neck: Normal range of motion. Neck supple. No thyromegaly present.  Without thyroid or bruits  Cardiovascular: Normal rate, regular rhythm, normal heart sounds and intact distal pulses.   No murmur heard. At 72/m  Pulmonary/Chest: Effort normal and breath sounds normal. No respiratory distress. She has no wheezes. She has no rales. She exhibits no tenderness.  Clear anteriorly and posteriorly  Abdominal: Soft. Bowel sounds are normal. She exhibits no mass. There is no tenderness. There is no rebound and no guarding.  Nontender without masses or organ enlargement or bruits  Musculoskeletal: Normal range of motion. She exhibits no edema.  Lymphadenopathy:    She has no cervical adenopathy.  Neurological: She is alert and oriented to person, place, and time. She has normal reflexes. No cranial nerve deficit.  Skin: Skin is warm and dry. No rash noted.  Psychiatric: She has a normal mood and affect. Her behavior is normal. Judgment and thought content normal.  Nursing note and vitals reviewed.   Blood pressure 143/89, pulse 60, temperature 97.3 F (36.3 C), temperature source Oral, height $RemoveBefo'5\' 3"'[id number]$  (1.6 m), weight 156 lb (70.761 kg).      Assessment & Plan:  1. Low serum parathyroid hormone (PTH) -We will recheck this test today in preparation for seeing the endocrinologist - CT Chest W Contrast; Future - Ambulatory referral to Endocrinology - Parathyroid hormone,  intact (no Ca)  2. Hypercalcemia -We will also recheck her calcium level. We will arrange for her to get a CT scan of the chest and neck for follow-up of her hypercalcemia - CT Chest W Contrast; Future - Ambulatory referral to Endocrinology - BMP8+EGFR  3. Hypothyroidism, unspecified hypothyroidism type -She will continue with her current thyroid treatment  4. Type 2 diabetes mellitus without complication -We'll continue to monitor blood sugars at home and record these readings for [place] to review when she returns to this office. She should also take the readings with her to see the endocrinologist that we're scheduling her to see.  Patient Instructions  We will arrange to get a CT of your abdomen and neck for further workup of the hypercalcemia We will also check a BMP which will have the serum calcium on that and get a PTH We will arrange for you to see the endocrinologist for follow-up Continue to drink plenty of fluids but is slowly and don't put yourself at risk for falling   Arrie Senate  MD   

## 2014-12-02 NOTE — Addendum Note (Signed)
Addended by: Thana Ates on: 12/02/2014 01:35 PM   Modules accepted: Orders

## 2014-12-02 NOTE — Patient Instructions (Signed)
We will arrange to get a CT of your abdomen and neck for further workup of the hypercalcemia We will also check a BMP which will have the serum calcium on that and get a PTH We will arrange for you to see the endocrinologist for follow-up Continue to drink plenty of fluids but is slowly and don't put yourself at risk for falling

## 2014-12-03 ENCOUNTER — Telehealth: Payer: Self-pay | Admitting: Family Medicine

## 2014-12-03 LAB — BMP8+EGFR
BUN / CREAT RATIO: 18 (ref 11–26)
BUN: 27 mg/dL (ref 8–27)
CO2: 24 mmol/L (ref 18–29)
CREATININE: 1.46 mg/dL — AB (ref 0.57–1.00)
Calcium: 9.8 mg/dL (ref 8.7–10.3)
Chloride: 102 mmol/L (ref 97–108)
GFR calc Af Amer: 39 mL/min/{1.73_m2} — ABNORMAL LOW (ref >59)
GFR calc non Af Amer: 34 mL/min/{1.73_m2} — ABNORMAL LOW (ref >59)
Glucose: 88 mg/dL (ref 65–99)
POTASSIUM: 4.8 mmol/L (ref 3.5–5.2)
Sodium: 142 mmol/L (ref 134–144)

## 2014-12-03 LAB — PARATHYROID HORMONE, INTACT (NO CA): PTH: 12 pg/mL — ABNORMAL LOW (ref 15–65)

## 2014-12-03 NOTE — Telephone Encounter (Signed)
-----   Message from [name], MD sent at 12/03/2014  1:54 PM EDT ----- The blood sugar is good at 88. The creatinine, the most important kidney function test remains elevated lower than it was 5 days ago. The electrolytes including potassium are good. The serum calcium is within normal limits. The PTH remains remains low.

## 2014-12-03 NOTE — Telephone Encounter (Signed)
Pt notified of results Verbalizes understanding 

## 2014-12-04 DIAGNOSIS — M25511 Pain in right shoulder: Secondary | ICD-10-CM | POA: Diagnosis not present

## 2014-12-11 ENCOUNTER — Other Ambulatory Visit: Payer: Self-pay

## 2014-12-11 DIAGNOSIS — R9389 Abnormal findings on diagnostic imaging of other specified body structures: Secondary | ICD-10-CM

## 2014-12-12 ENCOUNTER — Encounter (INDEPENDENT_AMBULATORY_CARE_PROVIDER_SITE_OTHER): Payer: Self-pay | Admitting: *Deleted

## 2014-12-16 ENCOUNTER — Ambulatory Visit (HOSPITAL_COMMUNITY)
Admission: RE | Admit: 2014-12-16 | Discharge: 2014-12-16 | Disposition: A | Discharge status: Home / Self Care | Payer: Medicare Other | Source: Ambulatory Visit | Attending: Family Medicine | Admitting: Family Medicine

## 2014-12-16 DIAGNOSIS — R7989 Other specified abnormal findings of blood chemistry: Secondary | ICD-10-CM

## 2014-12-16 DIAGNOSIS — F17200 Nicotine dependence, unspecified, uncomplicated: Secondary | ICD-10-CM | POA: Diagnosis not present

## 2014-12-16 DIAGNOSIS — E039 Hypothyroidism, unspecified: Secondary | ICD-10-CM | POA: Insufficient documentation

## 2014-12-16 DIAGNOSIS — R918 Other nonspecific abnormal finding of lung field: Secondary | ICD-10-CM | POA: Diagnosis not present

## 2014-12-16 DIAGNOSIS — Z87891 Personal history of nicotine dependence: Secondary | ICD-10-CM | POA: Diagnosis not present

## 2014-12-31 ENCOUNTER — Encounter: Payer: Self-pay | Admitting: Family Medicine

## 2014-12-31 ENCOUNTER — Ambulatory Visit (INDEPENDENT_AMBULATORY_CARE_PROVIDER_SITE_OTHER): Payer: Medicare Other | Admitting: Family Medicine

## 2014-12-31 VITALS — BP 129/79 | HR 69 | Temp 97.4°F | Ht 63.0 in | Wt 158.0 lb

## 2014-12-31 DIAGNOSIS — R7989 Other specified abnormal findings of blood chemistry: Secondary | ICD-10-CM | POA: Diagnosis not present

## 2014-12-31 NOTE — Patient Instructions (Addendum)
Follow-up with endocrinologist as planned Monitor blood sugars and take these readings with you to that visit Continue to drink plenty of fluids Return to clinic prior to the visit with endocrinologist and get your BMP and PTH repeated

## 2014-12-31 NOTE — Progress Notes (Signed)
Subjective:    Patient ID: Lisa Sutton, female    DOB: 1933-09-14, 81 y.o.   MRN: [id number]  HPI Patient here today for 4 week follow up on calcium, weight loss and recent tests. Patient is doing well today with no specific complaints. She has not seen the endocrinologist and does not have an appointment to see her until August 26. Her most recent PTH was improved but still low at 12. Her most recent serum calcium was within normal limits and this value was 9.8. Her creatinine was also improved at 1.46 and her blood sugar and electrolytes were good including her potassium. This lab work was done 4 weeks ago. The patient is doing much better and feeling more like herself since her hospitalization.      Patient Active Problem List   Diagnosis Date Noted  . Abnormal chest x-ray 11/27/2014  . Protein-calorie malnutrition, severe 11/27/2014  . AKI (acute kidney injury) 11/26/2014  . Hypercalcemia 11/26/2014  . Leukocytosis 11/26/2014  . Weight loss 11/26/2014  . Weakness 11/26/2014  . Insomnia 04/12/2013  . Hyperlipidemia 04/12/2013  . Essential hypertension, benign 10/13/2012  . GERD (gastroesophageal reflux disease) 04/25/2012  . Fatty liver 08/09/2011  . Hepatitis B 08/09/2011  . Dysphagia 08/09/2011  . Type 2 diabetes mellitus 08/09/2011  . Hypothyroid 08/09/2011   Outpatient Encounter Prescriptions as of 12/31/2014  Medication Sig  . acetaminophen (TYLENOL) 500 MG tablet Take 1,000 mg by mouth every 8 (eight) hours as needed for mild pain.  Marland Kitchen ADVANCED FIBER COMPLEX PO Take by mouth daily. These are gummies  . ALPRAZolam (XANAX) 0.25 MG tablet Take 1 tablet (0.25 mg total) by mouth 2 (two) times daily as needed for anxiety.  Marland Kitchen aspirin EC 81 MG tablet Take 81 mg by mouth daily.  Marland Kitchen atenolol (TENORMIN) 50 MG tablet Take 1 tablet (50 mg total) by mouth daily.  . Cholecalciferol (VITAMIN D) 1000 UNITS capsule Take 1,000 Units by mouth daily.    . fish oil-omega-3 fatty acids  1000 MG capsule Take 1,000 mg by mouth daily.    Marland Kitchen glucose blood (ACCU-CHEK AVIVA PLUS) test strip Test BS bid and PRN. Dx E11.9  . hydrocortisone cream 1 % Apply 1 application topically 2 (two) times daily.  Marland Kitchen levothyroxine (LEVOTHROID) 25 MCG tablet Take 1 tablet (25 mcg total) by mouth daily before breakfast.  . levothyroxine (SYNTHROID, LEVOTHROID) 112 MCG tablet Take 1 tablet (112 mcg total) by mouth daily.  Marland Kitchen lisinopril-hydrochlorothiazide (PRINZIDE,ZESTORETIC) 20-12.5 MG per tablet Take 1 tablet by mouth daily.  . meclizine (ANTIVERT) 12.5 MG tablet TAKE  (1)  TABLET  FOUR TIMES DAILY AS NEEDED DIZZINESS  . Multiple Vitamin (MULITIVITAMIN WITH MINERALS) TABS Take 1 tablet by mouth daily.   Marland Kitchen omeprazole (PRILOSEC) 20 MG capsule Take 1 capsule (20 mg total) by mouth daily. (Patient taking differently: Take 20 mg by mouth daily as needed (ACID REFLUX). )  . vitamin B-12 (CYANOCOBALAMIN) 500 MCG tablet Take 500 mcg by mouth 3 (three) times a week.    No facility-administered encounter medications on file as of 12/31/2014.      Review of Systems  Constitutional: Negative.   HENT: Negative.   Eyes: Negative.   Respiratory: Negative.   Cardiovascular: Negative.   Gastrointestinal: Negative.   Endocrine: Negative.   Genitourinary: Negative.   Musculoskeletal: Negative.   Skin: Negative.   Allergic/Immunologic: Negative.   Neurological: Negative.   Hematological: Negative.   Psychiatric/Behavioral: Negative.  Objective:   Physical Exam  Constitutional: She is oriented to person, place, and time. She appears well-developed and well-nourished. No distress.  HENT:  Head: Normocephalic and atraumatic.  Right Ear: External ear normal.  Left Ear: External ear normal.  Nose: Nose normal.  Mouth/Throat: Oropharynx is clear and moist.  Eyes: Conjunctivae and EOM are normal. Pupils are equal, round, and reactive to light. Right eye exhibits no discharge. Left eye exhibits no  discharge. No scleral icterus.  Neck: Normal range of motion. Neck supple. No thyromegaly present.  No thyromegaly or adenopathy or bruits  Cardiovascular: Normal rate, regular rhythm, normal heart sounds and intact distal pulses.   No murmur heard. The heart is regular at 60/m  Pulmonary/Chest: Effort normal and breath sounds normal. No respiratory distress. She has no wheezes. She has no rales. She exhibits no tenderness.  Clear anteriorly and posteriorly  Abdominal: Soft. Bowel sounds are normal. She exhibits no mass. There is no tenderness. There is no rebound and no guarding.  Musculoskeletal: Normal range of motion. She exhibits no edema or tenderness.  Lymphadenopathy:    She has no cervical adenopathy.  Neurological: She is alert and oriented to person, place, and time. She has normal reflexes. No cranial nerve deficit.  Skin: Skin is warm and dry. No rash noted.  Psychiatric: She has a normal mood and affect. Her behavior is normal. Judgment and thought content normal.  Nursing note and vitals reviewed.  BP 129/79 mmHg  Pulse 69  Temp(Src) 97.4 F (36.3 C) (Oral)  Ht 5' 3"  (1.6 m)  Wt 158 lb (71.668 kg)  BMI 28.00 kg/m2        Assessment & Plan:  1. Serum calcium elevated -The last serum calcium about 4 weeks ago was within normal limits, we will repeat this again prior to her visit with the endocrinologist - PTH, Intact and Calcium; Future - BMP8+EGFR; Future  2. Hypercalcemia  3. Low serum parathyroid hormone (PTH) -Last PTH was improved but still decreased  Patient Instructions  Follow-up with endocrinologist as planned Monitor blood sugars and take these readings with you to that visit Continue to drink plenty of fluids Return to clinic prior to the visit with endocrinologist and get your BMP and PTH repeated   Arrie Senate MD

## 2015-01-06 ENCOUNTER — Ambulatory Visit: Payer: Medicare Other | Admitting: Endocrinology

## 2015-01-13 ENCOUNTER — Other Ambulatory Visit (INDEPENDENT_AMBULATORY_CARE_PROVIDER_SITE_OTHER): Payer: Medicare Other

## 2015-01-13 NOTE — Progress Notes (Signed)
Lab only 

## 2015-01-14 ENCOUNTER — Telehealth: Payer: Self-pay | Admitting: Family Medicine

## 2015-01-14 LAB — BMP8+EGFR
BUN/Creatinine Ratio: 19 (ref 11–26)
BUN: 24 mg/dL (ref 8–27)
CHLORIDE: 98 mmol/L (ref 97–108)
CO2: 26 mmol/L (ref 18–29)
Calcium: 11.3 mg/dL — ABNORMAL HIGH (ref 8.7–10.3)
Creatinine, Ser: 1.26 mg/dL — ABNORMAL HIGH (ref 0.57–1.00)
GFR calc non Af Amer: 41 mL/min/{1.73_m2} — ABNORMAL LOW (ref >59)
GFR, EST AFRICAN AMERICAN: 47 mL/min/{1.73_m2} — AB (ref >59)
Glucose: 109 mg/dL — ABNORMAL HIGH (ref 65–99)
POTASSIUM: 4.4 mmol/L (ref 3.5–5.2)
Sodium: 138 mmol/L (ref 134–144)

## 2015-01-14 LAB — PTH, INTACT AND CALCIUM: PTH: 6 pg/mL — AB (ref 15–65)

## 2015-01-15 NOTE — Telephone Encounter (Signed)
Patient aware of results.  Appointment with endocrinologist Friday 8/26.

## 2015-01-17 ENCOUNTER — Encounter: Payer: Self-pay | Admitting: Internal Medicine

## 2015-01-17 ENCOUNTER — Ambulatory Visit (INDEPENDENT_AMBULATORY_CARE_PROVIDER_SITE_OTHER): Payer: Medicare Other | Admitting: Internal Medicine

## 2015-01-17 MED ORDER — FUROSEMIDE 20 MG PO TABS: 20.0000 mg | ORAL_TABLET | Freq: Every day | ORAL | Status: DC

## 2015-01-17 MED ORDER — LISINOPRIL 20 MG PO TABS: 20.0000 mg | ORAL_TABLET | Freq: Every day | ORAL | Status: DC

## 2015-01-17 NOTE — Patient Instructions (Signed)
Please stop the Lisinopril-HCTZ combination pill and start; - Lisinopril 20 mg daily - Furosemide (Lasix) 20 mg daily  Please have calcium and BP checked by Dr Laurance Flatten. If calcium still high >> you may need kidney investigation.   We will schedule as needed.

## 2015-01-17 NOTE — Progress Notes (Signed)
Patient ID: Lisa Sutton, female   DOB: May 27, 1933, 81 y.o.   MRN: [id number]   HPI  Lisa Sutton is a 81 y.o.-year-old female, referred by her PCP, Lisa Sutton for investigation for hypercalcemia/hypoparathyroidism.  She describes she has a history of hypothyroidism and has been on LT4 for 30 years. 6 mo ago, she had a TSH check >> abnormal >> dose increased >> after a month >> TSH normalized >> however, 1.5 mo after this >> losing her appetite, losing weight (30 lbs) >> TSH checked but it was still normal. Pt was not feeling well, was confused, was not mobile >> went to [facility] >> Ca was checked: 14 >> given fluids, Pamidronate >> went to see PCP after discharge >> Ca 9  >> repeat labs 01/13/2015 >> Ca 11.3. She was refer here for further investigation.  She started to gain few lbs back. She still feels a little dizzy.   Pt was dx with hypercalcemia in 11/2014. I reviewed pt's pertinent labs: Lab Results  Component Value Date   PTH 6* 01/13/2015   PTH Comment 01/13/2015   PTH 12* 12/02/2014   PTH 5* 11/26/2014   CALCIUM 11.3* 01/13/2015   CALCIUM 9.8 12/02/2014   CALCIUM 10.1 11/28/2014   CALCIUM 12.1* 11/27/2014   CALCIUM 14.5* 11/26/2014   CALCIUM 9.7 10/17/2014   CALCIUM 9.0 06/19/2014   CALCIUM 10.0 03/11/2014   CALCIUM 9.4 02/11/2014   CALCIUM 9.0 12/06/2013   Component     Latest Ref Rng 11/26/2014  PTH-related peptide      <0.74   Multiple myeloma w/u was negative: Component     Latest Ref Rng 11/26/2014  Total Protein ELP     6.0 - 8.5 g/dL 6.3  Albumin ELP     2.9 - 4.4 g/dL 3.1  Alpha-1 Glubulin     0.0 - 0.4 g/dL 0.2  Alpha-2 Globulin     0.4 - 1.0 g/dL 1.1 (H)  Beta Globulin     0.7 - 1.3 g/dL 1.0  Gamma Globulin     0.4 - 1.8 g/dL 0.9  M-SPIKE, %     Not Observed g/dL Not Observed  SPE Interp.      Comment  Comment      Comment  Globulin, Total     2.2 - 3.9 g/dL 3.2  A/G Ratio     0.7 - 1.7 1.0   Pt had a bone scan on  11/27/2014:  No suspicious focal bony abnormality or acute bony abnormality.  Of note, a thyroid U/S from 12/16/2014 showed no masses that could have been related to a parathyroid adenoma: 1. Atrophic and heterogeneous thyroid gland consistent with the clinical history of prolonged thyroid hormone replacement therapy. 2. Incidental note is made of small calcifications in the left gland, and a solitary 4 mm hypoechoic nodule in the mid right gland.  I reviewed pt's DEXA scans: Date L1-L4 T score FN T score  10/17/2014 +1.5 LFN: -1.3 RFN: -0.7   She describes being immobile this summer as she was not feeling well.  No fractures or falls.   + h/o kidney stones >> 2014.  + h/o CKD. Last BUN/Cr improved: Lab Results  Component Value Date   BUN 24 01/13/2015   CREATININE 1.26* 01/13/2015   Pt is on HCTZ! - started 2014.   No h/o vitamin D deficiency. Last vit D levels: Component     Latest Ref Rng 10/17/2014 11/26/2014  Vitamin D 1, 25 (  OH) Total       43  Vitamin D3 1, 25 (OH)       42  Vitamin D2 1, 25 (OH)       <10  Vit D, 25-Hydroxy     30.0 - 100.0 ng/mL 53.3    Pt is not on calcium and vitamin D - stopped 01/13/2015. Previously taking vit D3 1000 Units + a centrum MVI 1 tab a day; she eats dairy and green, leafy, vegetables.  Last TSH: Lab Results  Component Value Date   TSH 1.417 11/26/2014   Pt does not have a FH of hypercalcemia or osteoporosis. Father with kidney stones. Cousin with pituitary tumor (?type).   ROS: Constitutional: + fatigue, decreased appetite, weight loss, hot flashes, poor sleep, nocturia Eyes: + blurry vision, no xerophthalmia ENT: no sore throat, no nodules palpated in throat, no dysphagia/odynophagia, no hoarseness Cardiovascular: no CP/SOB/palpitations/leg swelling Respiratory: no cough/SOB Gastrointestinal: no N/V/D/C, + acid reflux Musculoskeletal: + both:muscle/joint aches Skin: no rashes Neurological: no  tremors/numbness/tingling/dizziness Psychiatric: no depression/anxiety  Past Medical History  Diagnosis Date  . Type 2 diabetes mellitus   . Essential hypertension, benign   . GERD (gastroesophageal reflux disease)   . Osteoporosis   . Obesity   . Hypothyroidism   . Dysphagia   . Hepatitis B   . Fatty liver   . Kidney stone August 2014    Patient was [facility] at [facility]   Past Surgical History  Procedure Laterality Date  . Abdominal hysterectomy    . Deviated septum repair    . Knee arthroscopy      left knee 2003  . Cataract extraction, bilateral     Social History   Social History  . Marital Status: Widowed    Spouse Name: N/A  . Number of Children: 1   Occupational History  . retired   Social History Main Topics  . Smoking status: Former smoker, quit in 1998.  Marland Kitchen Smokeless tobacco: Never Used  . Alcohol Use: No  . Drug Use: No   Current Outpatient Prescriptions on File Prior to Visit  Medication Sig Dispense Refill  . acetaminophen (TYLENOL) 500 MG tablet Take 1,000 mg by mouth every 8 (eight) hours as needed for mild pain.    Marland Kitchen ADVANCED FIBER COMPLEX PO Take by mouth daily. These are gummies    . ALPRAZolam (XANAX) 0.25 MG tablet Take 1 tablet (0.25 mg total) by mouth 2 (two) times daily as needed for anxiety. 180 tablet 1  . aspirin EC 81 MG tablet Take 81 mg by mouth daily.    Marland Kitchen atenolol (TENORMIN) 50 MG tablet Take 1 tablet (50 mg total) by mouth daily. 90 tablet 3  . Cholecalciferol (VITAMIN D) 1000 UNITS capsule Take 1,000 Units by mouth daily.      . fish oil-omega-3 fatty acids 1000 MG capsule Take 1,000 mg by mouth daily.      Marland Kitchen glucose blood (ACCU-CHEK AVIVA PLUS) test strip Test BS bid and PRN. Dx E11.9 300 each 3  . hydrocortisone cream 1 % Apply 1 application topically 2 (two) times daily. 30 g 2  . levothyroxine (LEVOTHROID) 25 MCG tablet Take 1 tablet (25 mcg total) by mouth daily before breakfast. 90 tablet 3  . levothyroxine (SYNTHROID, LEVOTHROID)  112 MCG tablet Take 1 tablet (112 mcg total) by mouth daily. 90 tablet 3  . meclizine (ANTIVERT) 12.5 MG tablet TAKE  (1)  TABLET  FOUR TIMES DAILY AS NEEDED DIZZINESS 30 tablet 0  .  Multiple Vitamin (MULITIVITAMIN WITH MINERALS) TABS Take 1 tablet by mouth daily.     Marland Kitchen omeprazole (PRILOSEC) 20 MG capsule Take 1 capsule (20 mg total) by mouth daily. (Patient taking differently: Take 20 mg by mouth daily as needed (ACID REFLUX). ) 90 capsule 3  . [DISCONTINUED] calcium citrate-vitamin D 200-200 MG-UNIT TABS Take 1 tablet by mouth daily.       No current facility-administered medications on file prior to visit.   Allergies  Allergen Reactions  . Zetia [Ezetimibe]   . Livalo [Pitavastatin] Other (See Comments)  . Zocor [Simvastatin] Other (See Comments)   Family History  Problem Relation Age of Onset  . Hypertension Father   . Cancer Maternal Aunt    PE: BP 138/80 mmHg  Pulse 72  Temp(Src) 98.1 F (36.7 C) (Oral)  Resp 12  Ht _0  (1.6 m)  Wt 162 lb (73.483 kg)  BMI 28.70 kg/m2  SpO2 96% Wt Readings from Last 3 Encounters:  01/17/15 162 lb (73.483 kg)  12/31/14 158 lb (71.668 kg)  12/02/14 156 lb (70.761 kg)   Constitutional: overweight, in NAD. No kyphosis. Eyes: PERRLA, EOMI, no exophthalmos ENT: moist mucous membranes, no thyromegaly, no cervical lymphadenopathy Cardiovascular: RRR, No MRG Respiratory: CTA B Gastrointestinal: abdomen soft, NT, ND, BS+ Musculoskeletal: no deformities, strength intact in all 4 Skin: moist, warm, no rashes Neurological: no tremor with outstretched hands, DTR normal in all 4  Assessment: 1. Hypercalcemia/hypoparathyroidism  Plan: - Patient has had several instances of elevated calcium, with the highest level being at 14.5 this summer, during an episode of acute kidney injury (etiology of the AK I is unknown). A corresponding intact PTH level was appropriately suppressed, at 5. Patient was hospitalized at that time, hydrated, and given  bisphosphonates. Calcium decreased, and her GFR also continued to improve. The latest calcium, though, was high again, at 11.3. The PTH remains low. - Patient had an extensive investigation for her hypercalcemia, to include, a vitamin D level (normal), a vitamin D 1, 25 dihydroxy level (normal), PTHrp (undetectable), thyroid tests (normal), multiple myeloma workup (negative) ,a bone scan that was negative for metastases or other lesions, a DEXA scan was negative for osteoporosis or osteopenia. I reviewed the above workup along with the patient and explained that this is reassuring, and also the fact that the fact that her parathyroid hormone was appropriately suppressed rules out a parathyroid adenoma as a culprit for her high calcium.  - My opinion is that she had an episode of AKI, which triggered her elevated calcium in the setting of HCTZ treatment.  - I advised her to stop the HCTZ and have a repeat calcium level and BP check at the time of her next appointment with PCP in 1 month. I ordered the labs as future orders. At that time, we'll also check a vitamin D level, since she stopped her vitamin D supplementation for fear of further hypercalcemia. Patient Instructions  Please stop the Lisinopril-HCTZ combination pill and start: - Lisinopril 20 mg daily - Furosemide (Lasix) 20 mg daily  Please have calcium and BP checked by Dr Laurance Flatten. If calcium still high >> you may need kidney investigation.   - if the calcium is still elevated after 1 month off HCTZ, she may need nephrology evaluation. With and appropriately suppressed PTH in the setting of high calcium, her hypercalcemia appears not to be related to an endocrine issue. - Patient will return to see me as needed.  Orders Placed This Encounter  Procedures  . BASIC METABOLIC PANEL WITH GFR  . Vitamin D (25 hydroxy)  . Calcium, ionized   - time spent with the patient: 1 hour, of which >50% was spent in obtaining information about her symptoms,  reviewing her previous labs, evaluations, and treatments, counseling her about her condition (please see the discussed topics above), and developing a plan to further investigate it; she had a number of questions which I addressed.

## 2015-01-21 ENCOUNTER — Encounter: Payer: Self-pay | Admitting: Internal Medicine

## 2015-01-22 ENCOUNTER — Other Ambulatory Visit: Payer: Self-pay | Admitting: *Deleted

## 2015-01-22 MED ORDER — FUROSEMIDE 20 MG PO TABS: 40.0000 mg | ORAL_TABLET | Freq: Every day | ORAL | Status: DC

## 2015-01-22 NOTE — Telephone Encounter (Signed)
Called pt and advised her per Dr Cruzita Lederer: If BP still high, increase lasix to 40 mg daily, call back on Friday and let us know how her BP is. Pt voiced understanding.

## 2015-01-24 ENCOUNTER — Telehealth: Payer: Self-pay | Admitting: Internal Medicine

## 2015-01-24 NOTE — Telephone Encounter (Signed)
These have improved. Continue to check over the weekend and let me know again on Tue.

## 2015-01-24 NOTE — Telephone Encounter (Signed)
Please read message below and advise.  

## 2015-01-24 NOTE — Telephone Encounter (Signed)
Pt called in with BP readings  3- days ago it was 166/84 , 29th 169/84, 30th 174/98 , 31st 178/86(am) , 175/87(evening)   9/1, 151/91(am) 146/84(evening) , 9/2 153/81 ( am)  145/75 ( evening), please call pt and advise  [phone]

## 2015-01-24 NOTE — Telephone Encounter (Signed)
Called pt and spoke with pt's son. Advised him per Dr Arman Filter message below. He voiced understanding and will advise pt.

## 2015-01-29 ENCOUNTER — Telehealth: Payer: Self-pay | Admitting: Family Medicine

## 2015-01-29 ENCOUNTER — Telehealth: Payer: Self-pay | Admitting: Internal Medicine

## 2015-01-29 NOTE — Telephone Encounter (Signed)
Patient called stated that she has seen endocrinologist and the endocrinologist stated that the bp medication was increasing her calcium and was admitted to the hospital.  Patient also stated that endocrinologist changed her bp medication to just lisinopril and lasix 40mg .  Home BP readings still elevated and endocrinologist suggest that Dr. Laurance Flatten change BP medication.   Appt made with Dr. Laurance Flatten for 01/31/15.

## 2015-01-29 NOTE — Telephone Encounter (Signed)
Please review below and advise

## 2015-01-29 NOTE — Telephone Encounter (Signed)
Called pt and advised her per Dr Arman Filter message below. Pt voiced understanding and will contact her PCP.

## 2015-01-29 NOTE — Telephone Encounter (Signed)
At this point, I would like her to contact PCP as she will likely need a new BP med added.

## 2015-01-29 NOTE — Telephone Encounter (Signed)
Patient called to give her blood pressure readings   Sept 1  151/91 AM 146/84 PM  Sept 2 153/91 145/75  Sept 3 166/92 162/87  Sept 4 154/90 156/83  Sept 5 129/87 129/73  Sept 6 148/82 123/82  Sept 7 155/80  Please advise   Thank you

## 2015-01-31 ENCOUNTER — Encounter: Payer: Self-pay | Admitting: Family Medicine

## 2015-01-31 ENCOUNTER — Ambulatory Visit (INDEPENDENT_AMBULATORY_CARE_PROVIDER_SITE_OTHER): Payer: Medicare Other | Admitting: Family Medicine

## 2015-01-31 VITALS — BP 124/76 | HR 73 | Temp 97.0°F | Ht 63.0 in | Wt 159.0 lb

## 2015-01-31 DIAGNOSIS — I10 Essential (primary) hypertension: Secondary | ICD-10-CM | POA: Diagnosis not present

## 2015-01-31 DIAGNOSIS — M75111 Incomplete rotator cuff tear or rupture of right shoulder, not specified as traumatic: Secondary | ICD-10-CM | POA: Diagnosis not present

## 2015-01-31 MED ORDER — FUROSEMIDE 20 MG PO TABS: 40.0000 mg | ORAL_TABLET | Freq: Every day | ORAL | Status: DC

## 2015-01-31 NOTE — Patient Instructions (Addendum)
The patient should follow-up with lab work as planned by the endocrinologist She should continue to take the Lasix 40 mg daily along separately with the lisinopril She should come by in about a week and get a BMP She should follow-up with orthopedic surgeon as planned for her partial rotator cuff repair She should come by tomorrow and bring her cuff and check her blood pressure in this office with her cuff.

## 2015-01-31 NOTE — Progress Notes (Signed)
Subjective:    Patient ID: Lisa Sutton, female    DOB: 1934/04/08, 81 y.o.   MRN: [id number]  HPI Patient here today for BP follow up. She has been in the hospital and seen endocrinology since she was last here and meds have been changed. The patient has recently seen an endocrinologist and that doctors feelings were that the patient's problems may be related to the HCTZ which was stopped during the visit the patient was put on furosemide instead. The patient does bring in some home blood pressure readings today and these readings are somewhat variable with some readings being as high as the upper 150s to low 160s while others are good in the 120s to 1:30 range. These blood pressure readings will be scanned into the record. The patient is feeling well currently.       Patient Active Problem List   Diagnosis Date Noted  . Abnormal chest x-ray 11/27/2014  . Protein-calorie malnutrition, severe 11/27/2014  . AKI (acute kidney injury) 11/26/2014  . Hypercalcemia 11/26/2014  . Leukocytosis 11/26/2014  . Weight loss 11/26/2014  . Weakness 11/26/2014  . Insomnia 04/12/2013  . Hyperlipidemia 04/12/2013  . Essential hypertension, benign 10/13/2012  . GERD (gastroesophageal reflux disease) 04/25/2012  . Fatty liver 08/09/2011  . Hepatitis B 08/09/2011  . Dysphagia 08/09/2011  . Type 2 diabetes mellitus 08/09/2011  . Hypothyroid 08/09/2011   Outpatient Encounter Prescriptions as of 01/31/2015  Medication Sig  . acetaminophen (TYLENOL) 500 MG tablet Take 1,000 mg by mouth every 8 (eight) hours as needed for mild pain.  Marland Kitchen ADVANCED FIBER COMPLEX PO Take by mouth daily. These are gummies  . ALPRAZolam (XANAX) 0.25 MG tablet Take 1 tablet (0.25 mg total) by mouth 2 (two) times daily as needed for anxiety.  Marland Kitchen aspirin EC 81 MG tablet Take 81 mg by mouth daily.  Marland Kitchen atenolol (TENORMIN) 50 MG tablet Take 1 tablet (50 mg total) by mouth daily.  . Cholecalciferol (VITAMIN D) 1000 UNITS capsule  Take 1,000 Units by mouth daily.    . fish oil-omega-3 fatty acids 1000 MG capsule Take 1,000 mg by mouth daily.    . furosemide (LASIX) 20 MG tablet Take 2 tablets (40 mg total) by mouth daily.  Marland Kitchen glucose blood (ACCU-CHEK AVIVA PLUS) test strip Test BS bid and PRN. Dx E11.9  . hydrocortisone cream 1 % Apply 1 application topically 2 (two) times daily.  Marland Kitchen levothyroxine (LEVOTHROID) 25 MCG tablet Take 1 tablet (25 mcg total) by mouth daily before breakfast.  . levothyroxine (SYNTHROID, LEVOTHROID) 112 MCG tablet Take 1 tablet (112 mcg total) by mouth daily.  Marland Kitchen lisinopril (PRINIVIL,ZESTRIL) 20 MG tablet Take 1 tablet (20 mg total) by mouth daily.  . meclizine (ANTIVERT) 12.5 MG tablet TAKE  (1)  TABLET  FOUR TIMES DAILY AS NEEDED DIZZINESS  . Multiple Vitamin (MULITIVITAMIN WITH MINERALS) TABS Take 1 tablet by mouth daily.   Marland Kitchen omeprazole (PRILOSEC) 20 MG capsule Take 1 capsule (20 mg total) by mouth daily. (Patient taking differently: Take 20 mg by mouth daily as needed (ACID REFLUX). )   No facility-administered encounter medications on file as of 01/31/2015.     Review of Systems  Constitutional: Negative.   HENT: Negative.   Eyes: Negative.   Respiratory: Negative.   Cardiovascular: Negative.   Gastrointestinal: Negative.   Endocrine: Negative.   Genitourinary: Negative.   Musculoskeletal: Negative.   Skin: Negative.   Allergic/Immunologic: Negative.   Neurological: Negative.   Hematological: Negative.  Psychiatric/Behavioral: Negative.        Objective:   Physical Exam  Constitutional: She is oriented to person, place, and time. She appears well-developed and well-nourished. No distress.  HENT:  Head: Normocephalic.  Eyes: Conjunctivae and EOM are normal. Pupils are equal, round, and reactive to light. Right eye exhibits no discharge. Left eye exhibits no discharge. No scleral icterus.  Neck: Normal range of motion. Neck supple. No JVD present. No thyromegaly present.    Cardiovascular: Normal rate, regular rhythm, normal heart sounds and intact distal pulses.   No murmur heard. Pulmonary/Chest: Effort normal and breath sounds normal. She has no wheezes. She has no rales.  Musculoskeletal: Normal range of motion. She exhibits tenderness. She exhibits no edema.  Patient has some discomfort in her right shoulder with movement.  Lymphadenopathy:    She has no cervical adenopathy.  Neurological: She is alert and oriented to person, place, and time.  Skin: Skin is warm and dry. No rash noted.  Psychiatric: She has a normal mood and affect. Her behavior is normal. Judgment and thought content normal.  Nursing note and vitals reviewed.  BP 124/76 mmHg  Pulse 73  Temp(Src) 97 F (36.1 C) (Oral)  Ht 5\' 3"  (1.6 m)  Wt 159 lb (72.122 kg)  BMI 28.17 kg/m2 Repeat blood pressure by me with a regular cuff in the left arm sitting was 138/85       Assessment & Plan:  1. Hypercalcemia -It has been concluded by the endocrinologist that the HCTZ may be playing a role with this. She was subsequently changed to furosemide. The BMP  along with other tests will be done by the endocrinologist toward the end of the month. Further evaluation of the hypercalcemia will be done at that time.  2. Essential hypertension, benign -Continue current treatment regimen. Bring patient's blood pressure monitor by in the morning for confirmation that correct readings are being done at home.  3. Partial tear of right rotator cuff -Follow-up with orthopedist as planned  Meds ordered this encounter  Medications  . furosemide (LASIX) 20 MG tablet    Sig: Take 2 tablets (40 mg total) by mouth daily.    Dispense:  60 tablet    Refill:  3   Patient Instructions  The patient should follow-up with lab work as planned by the endocrinologist She should continue to take the Lasix 40 mg daily along separately with the lisinopril She should come by in about a week and get a BMP She should  follow-up with orthopedic surgeon as planned for her partial rotator cuff repair She should come by tomorrow and bring her cuff and check her blood pressure in this office with her cuff.   Arrie Senate MD

## 2015-02-03 ENCOUNTER — Encounter: Payer: Self-pay | Admitting: *Deleted

## 2015-02-03 ENCOUNTER — Ambulatory Visit (INDEPENDENT_AMBULATORY_CARE_PROVIDER_SITE_OTHER): Payer: Medicare Other | Admitting: Internal Medicine

## 2015-02-03 NOTE — Progress Notes (Signed)
Patient came by Saturday for BP rck -- her machine read 165/93, P- 64 Our machine read 131/77, p-63  She was told that the difference meant that her machine was most likely not accurate. It was a old machine and she will replace it and cont to monitor BP.

## 2015-02-07 ENCOUNTER — Encounter: Payer: Medicare Other | Admitting: *Deleted

## 2015-02-07 ENCOUNTER — Other Ambulatory Visit (INDEPENDENT_AMBULATORY_CARE_PROVIDER_SITE_OTHER): Payer: Medicare Other

## 2015-02-07 ENCOUNTER — Other Ambulatory Visit: Payer: Medicare Other

## 2015-02-07 DIAGNOSIS — I10 Essential (primary) hypertension: Secondary | ICD-10-CM

## 2015-02-07 NOTE — Progress Notes (Signed)
Lab only 

## 2015-02-08 LAB — BMP8+EGFR
BUN / CREAT RATIO: 20 (ref 11–26)
BUN: 27 mg/dL (ref 8–27)
CO2: 28 mmol/L (ref 18–29)
CREATININE: 1.36 mg/dL — AB (ref 0.57–1.00)
Calcium: 11.9 mg/dL — ABNORMAL HIGH (ref 8.7–10.3)
Chloride: 97 mmol/L (ref 97–108)
GFR calc Af Amer: 43 mL/min/{1.73_m2} — ABNORMAL LOW (ref >59)
GFR calc non Af Amer: 37 mL/min/{1.73_m2} — ABNORMAL LOW (ref >59)
GLUCOSE: 131 mg/dL — AB (ref 65–99)
Potassium: 4.6 mmol/L (ref 3.5–5.2)
Sodium: 139 mmol/L (ref 134–144)

## 2015-02-10 ENCOUNTER — Ambulatory Visit (INDEPENDENT_AMBULATORY_CARE_PROVIDER_SITE_OTHER): Payer: Commercial Managed Care - HMO | Admitting: Internal Medicine

## 2015-02-10 NOTE — Telephone Encounter (Signed)
Patient ask Dr Cruzita Lederer to look at her My chart, at her labs results From Dr Laurance Flatten. Please advise

## 2015-02-11 ENCOUNTER — Telehealth: Payer: Self-pay | Admitting: Internal Medicine

## 2015-02-11 NOTE — Telephone Encounter (Signed)
Lisa Sutton, Please advised patient that, since her parathyroid hormone is suppressed in the presence of high calcium, this is not an endocrine problem. As I discussed with her at the time of the visit, she will need a nephrology referral as I suspect that her PTH is high due to her kidney dysfunction. She does not need an appointment with me at this time.

## 2015-02-11 NOTE — Telephone Encounter (Signed)
Read message below and be advised.  

## 2015-02-11 NOTE — Telephone Encounter (Signed)
[facility] note dated 02-11-15 8:13  Return Phone Number: [phone] (Primary), [phone] (Secondary) Address: City/State/Zip: [place] Client [facility] - Client Client Site [facility] Physician Philemon Kingdom Contact Type Call Call Type Triage / Clinical Relationship To Patient Self Return Phone Number [phone] (Secondary) Chief Complaint Health information question (non symptomatic) Initial Comment Caller states they are needing to go over some blood work she received recently. Nurse Assessment Nurse: Timmothy Euler, RN, Joelene Millin Date/Time (Eastern Time): 02/10/2015 6:07:30 PM Confirm and document reason for call. If symptomatic, describe symptoms. ---Caller states she is needing to go over some blood work she received recently. Saw endo. on August 26th and BP medication was changed. Started feeling bad again and saw her regular doctor Friday. States her calcium result from Friday is 11.9 and is supposed to see her endocrinologist again. I instructed her to call back tomorrow morning during regular business hours to get an appt. to discuss her calcium. Has the patient traveled out of the country within the last 30

## 2015-02-11 NOTE — Telephone Encounter (Signed)
Patient ask if she is going to schedule the referral to nephrology, please advise

## 2015-02-12 NOTE — Telephone Encounter (Signed)
No, please continue to stay hydrated and let's repeat the calcium level in another week. This can be done at PCPs office. That she discuss with her PCP about the referral to nephrology?

## 2015-02-12 NOTE — Telephone Encounter (Signed)
Called pt and advised her per Dr Arman Filter message. Pt voiced she does not have a referral to nephrology. Pt wants to know if Dr Cruzita Lederer could do the referral? Also, pt feels really bad and dizzy. Pt said that she is sure its from the high calcium. Pt wants to know if Dr Cruzita Lederer wants to put her in the hospital to get her calcium lowered. Please advise.

## 2015-02-13 ENCOUNTER — Other Ambulatory Visit: Payer: Self-pay | Admitting: *Deleted

## 2015-02-13 ENCOUNTER — Telehealth: Payer: Self-pay | Admitting: Family Medicine

## 2015-02-13 NOTE — Telephone Encounter (Signed)
Patient states that she has already spoke with someone.

## 2015-02-13 NOTE — Telephone Encounter (Signed)
Called pt and advised her per Dr Arman Filter message below. Pt voiced understanding. Pt stated she already has appt with her PCP on Monday.

## 2015-02-17 ENCOUNTER — Ambulatory Visit: Payer: Medicare Other | Admitting: Family Medicine

## 2015-02-17 ENCOUNTER — Other Ambulatory Visit (INDEPENDENT_AMBULATORY_CARE_PROVIDER_SITE_OTHER): Payer: Medicare Other

## 2015-02-17 NOTE — Progress Notes (Signed)
Lab for Dr Philemon Kingdom  BMP, VIT D, IONIZED CALCIUM [phone]

## 2015-02-18 LAB — BMP8+EGFR
BUN / CREAT RATIO: 20 (ref 11–26)
BUN: 25 mg/dL (ref 8–27)
CO2: 25 mmol/L (ref 18–29)
CREATININE: 1.27 mg/dL — AB (ref 0.57–1.00)
Calcium: 10.5 mg/dL — ABNORMAL HIGH (ref 8.7–10.3)
Chloride: 98 mmol/L (ref 97–108)
GFR calc non Af Amer: 40 mL/min/{1.73_m2} — ABNORMAL LOW (ref >59)
GFR, EST AFRICAN AMERICAN: 46 mL/min/{1.73_m2} — AB (ref >59)
GLUCOSE: 170 mg/dL — AB (ref 65–99)
Potassium: 4.3 mmol/L (ref 3.5–5.2)
SODIUM: 139 mmol/L (ref 134–144)

## 2015-02-18 LAB — VITAMIN D 25 HYDROXY (VIT D DEFICIENCY, FRACTURES): VIT D 25 HYDROXY: 41.1 ng/mL (ref 30.0–100.0)

## 2015-02-18 LAB — CALCIUM, IONIZED: Calcium, Ion: 5.6 mg/dL (ref 4.5–5.6)

## 2015-02-19 ENCOUNTER — Telehealth: Payer: Self-pay | Admitting: Family Medicine

## 2015-02-19 DIAGNOSIS — N289 Disorder of kidney and ureter, unspecified: Secondary | ICD-10-CM

## 2015-02-19 NOTE — Telephone Encounter (Signed)
Labs were reviewed with pt and nephrology referral placed  She wants to know of she can have something sent in for sinus infection: nasal congestion, pressure and bloody drainage  Send to [facility]

## 2015-02-20 MED ORDER — AZITHROMYCIN 250 MG PO TABS: ORAL_TABLET | ORAL | Status: DC

## 2015-02-20 NOTE — Telephone Encounter (Signed)
Per DR Sabra Heck - we can call in zpak and she should take mucinex.  Pt called and aware  Med sent to pharm

## 2015-03-03 ENCOUNTER — Telehealth: Payer: Self-pay | Admitting: Family Medicine

## 2015-03-03 NOTE — Telephone Encounter (Signed)
Pt aware of how nephrology lottery works; She is aware it may be months before her number is pulled

## 2015-03-04 ENCOUNTER — Encounter (INDEPENDENT_AMBULATORY_CARE_PROVIDER_SITE_OTHER): Payer: Self-pay | Admitting: Internal Medicine

## 2015-03-04 ENCOUNTER — Ambulatory Visit (INDEPENDENT_AMBULATORY_CARE_PROVIDER_SITE_OTHER): Payer: Medicare Other | Admitting: Internal Medicine

## 2015-03-04 VITALS — BP 130/90 | HR 68 | Temp 97.2°F | Resp 18 | Ht 63.0 in | Wt 158.6 lb

## 2015-03-04 DIAGNOSIS — B181 Chronic viral hepatitis B without delta-agent: Secondary | ICD-10-CM | POA: Diagnosis not present

## 2015-03-04 DIAGNOSIS — K219 Gastro-esophageal reflux disease without esophagitis: Secondary | ICD-10-CM | POA: Diagnosis not present

## 2015-03-04 NOTE — Patient Instructions (Signed)
Physician will call with results of tests when completed. 

## 2015-03-04 NOTE — Progress Notes (Signed)
Presenting complaint;  History of positive hepatitis B surface antigen and GERD.  Subjective:  Patient is 81 year old Caucasian female who is here for scheduled visit. She was last seen in September 2015. She has history of positive hepatitis B surface antigen but negative E antigen as well as undetectable HBV DNA and felt to have inactive carrier stage. One year ago hepatitis B surface antigen was negative. She states she was hospitalized at [facility] with hypercalcemia. She states she was sick for 6-8 weeks prior to that admission she lost her appetite and just did not feel well. She also felt weak and lightheaded and unsteady walking. She was poorly evaluated and felt to have hypercalcemia secondary to HCTZ. This medication was discontinued and serum calcium has remained normal. She says she has fully recovered. She states she lost close to 30 pounds but she has gained 5 back. Her appetite is not normal. She denies nausea vomiting abdominal pain melena or rectal bleeding. Bowels move daily. She was diagnosed with right rotator cuff tear and is planning to undergo surgery as soon as she is been evaluated by nephrologist. She says heartburns well controlled with when necessary PPI. She denies dysphagia.   Current Medications: Outpatient Encounter Prescriptions as of 03/04/2015  Medication Sig  . acetaminophen (TYLENOL) 500 MG tablet Take 1,000 mg by mouth every 8 (eight) hours as needed for mild pain.  Marland Kitchen ADVANCED FIBER COMPLEX PO Take by mouth daily. These are gummies  . ALPRAZolam (XANAX) 0.25 MG tablet Take 1 tablet (0.25 mg total) by mouth 2 (two) times daily as needed for anxiety.  Marland Kitchen aspirin EC 81 MG tablet Take 81 mg by mouth daily.  Marland Kitchen atenolol (TENORMIN) 50 MG tablet Take 1 tablet (50 mg total) by mouth daily.  . Cholecalciferol (VITAMIN D) 1000 UNITS capsule Take 1,000 Units by mouth daily.    . fish oil-omega-3 fatty acids 1000 MG capsule Take 1,000 mg by mouth daily.    . furosemide (LASIX)  20 MG tablet Take 2 tablets (40 mg total) by mouth daily.  Marland Kitchen glucose blood (ACCU-CHEK AVIVA PLUS) test strip Test BS bid and PRN. Dx E11.9  . levothyroxine (LEVOTHROID) 25 MCG tablet Take 1 tablet (25 mcg total) by mouth daily before breakfast.  . levothyroxine (SYNTHROID, LEVOTHROID) 112 MCG tablet Take 1 tablet (112 mcg total) by mouth daily.  Marland Kitchen lisinopril (PRINIVIL,ZESTRIL) 20 MG tablet Take 1 tablet (20 mg total) by mouth daily.  . meclizine (ANTIVERT) 12.5 MG tablet TAKE  (1)  TABLET  FOUR TIMES DAILY AS NEEDED DIZZINESS  . Multiple Vitamin (MULITIVITAMIN WITH MINERALS) TABS Take 1 tablet by mouth daily.   Marland Kitchen omeprazole (PRILOSEC) 20 MG capsule Take 1 capsule (20 mg total) by mouth daily. (Patient taking differently: Take 20 mg by mouth daily as needed (ACID REFLUX). )  . [DISCONTINUED] azithromycin (ZITHROMAX) 250 MG tablet As directed (Patient not taking: Reported on 03/04/2015)  . [DISCONTINUED] hydrocortisone cream 1 % Apply 1 application topically 2 (two) times daily. (Patient not taking: Reported on 03/04/2015)   No facility-administered encounter medications on file as of 03/04/2015.     Objective: Blood pressure 130/90, pulse 68, temperature 97.2 F (36.2 C), temperature source Oral, resp. rate 18, height 5\' 3"  (1.6 m), weight 158 lb 9.6 oz (71.94 kg). Patient is alert and in no acute distress. Conjunctiva is pink. Sclera is nonicteric Oropharyngeal mucosa is normal. No neck masses or thyromegaly noted. Cardiac exam with regular rhythm normal S1 and S2. No murmur or  gallop noted. Lungs are clear to auscultation. Abdomen is full but soft and nontender without organomegaly or masses.  No LE edema or clubbing noted.  Lab data: LFTs from 11/28/2014 Bilirubin 0.4, AP 68, AST 15, ALT 11 and albumen 2.9. WBC 6.5, H&H 11 and 33.6 and platelet count 240 6K    Assessment:  #1. Inactive hepatitis B carrier state which was initially discovered in 2009. HBV DNA has remained  undetectable. Hepatitis B surface antigen in September 2015 was negative but hepatitis B surface antibody was also negative. Transaminases remain normal. Recent drop in albumen would appear to be due to acute illness secondary to hypercalcemia which has resolved. She does not have stigmata of portal hypertension. No therapeutic intervention needed other than periodic monitoring. #2. GERD. She is doing well with dietary measures and when necessary PPI. #3. 27 pound weight loss secondary to recent illness with hypercalcemia resulting in anorexia for 7 weeks or so. Workup negative for malignancy in hypercalcemia appears to be secondary to HCTZ and has resolved on stopping this medication. She has been evaluated by endocrinologist and has an appointment with nephrologist.   Plan:  LFTs. Hepatitis B surface antigen. Hepatitis B surface antibody. Office visit in one year.

## 2015-03-05 ENCOUNTER — Other Ambulatory Visit: Payer: Self-pay | Admitting: Internal Medicine

## 2015-03-12 ENCOUNTER — Encounter: Payer: Self-pay | Admitting: Family Medicine

## 2015-03-12 ENCOUNTER — Ambulatory Visit (INDEPENDENT_AMBULATORY_CARE_PROVIDER_SITE_OTHER): Payer: Medicare Other | Admitting: Family Medicine

## 2015-03-12 VITALS — BP 143/78 | HR 58 | Temp 97.7°F | Ht 63.0 in | Wt 162.0 lb

## 2015-03-12 DIAGNOSIS — I10 Essential (primary) hypertension: Secondary | ICD-10-CM | POA: Diagnosis not present

## 2015-03-12 DIAGNOSIS — R591 Generalized enlarged lymph nodes: Secondary | ICD-10-CM | POA: Diagnosis not present

## 2015-03-12 DIAGNOSIS — K219 Gastro-esophageal reflux disease without esophagitis: Secondary | ICD-10-CM | POA: Diagnosis not present

## 2015-03-12 DIAGNOSIS — E559 Vitamin D deficiency, unspecified: Secondary | ICD-10-CM

## 2015-03-12 DIAGNOSIS — E039 Hypothyroidism, unspecified: Secondary | ICD-10-CM

## 2015-03-12 DIAGNOSIS — E119 Type 2 diabetes mellitus without complications: Secondary | ICD-10-CM | POA: Diagnosis not present

## 2015-03-12 DIAGNOSIS — E785 Hyperlipidemia, unspecified: Secondary | ICD-10-CM

## 2015-03-12 MED ORDER — LEVOTHYROXINE SODIUM 25 MCG PO TABS: 25.0000 ug | ORAL_TABLET | Freq: Every day | ORAL | Status: DC

## 2015-03-12 MED ORDER — ALPRAZOLAM 0.25 MG PO TABS: 0.2500 mg | ORAL_TABLET | Freq: Two times a day (BID) | ORAL | Status: DC | PRN

## 2015-03-12 MED ORDER — LEVOTHYROXINE SODIUM 112 MCG PO TABS: 112.0000 ug | ORAL_TABLET | Freq: Every day | ORAL | Status: DC

## 2015-03-12 MED ORDER — FUROSEMIDE 20 MG PO TABS
40.0000 mg | ORAL_TABLET | Freq: Every day | ORAL | Status: DC
Start: 2015-03-12 — End: 2016-07-16

## 2015-03-12 MED ORDER — LISINOPRIL 20 MG PO TABS: 20.0000 mg | ORAL_TABLET | Freq: Every day | ORAL | Status: DC

## 2015-03-12 MED ORDER — METFORMIN HCL 500 MG PO TABS: 500.0000 mg | ORAL_TABLET | Freq: Two times a day (BID) | ORAL | Status: DC

## 2015-03-12 NOTE — Patient Instructions (Addendum)
Medicare Annual Wellness Visit  [facility] and the medical providers at [facility] strive to bring you the best medical care.  In doing so we not only want to address your current medical conditions and concerns but also to detect new conditions early and prevent illness, disease and health-related problems.    Medicare offers a yearly Wellness Visit which allows our clinical staff to assess your need for preventative services including immunizations, lifestyle education, counseling to decrease risk of preventable diseases and screening for fall risk and other medical concerns.    This visit is provided free of charge (no copay) for all Medicare recipients. The clinical pharmacists at [facility] have begun to conduct these Wellness Visits which will also include a thorough review of all your medications.    As you primary medical provider recommend that you make an appointment for your Annual Wellness Visit if you have not done so already this year.  You may set up this appointment before you leave today or you may call back [phone]) and schedule an appointment.  Please make sure when you call that you mention that you are scheduling your Annual Wellness Visit with the clinical pharmacist so that the appointment may be made for the proper length of time.     Continue current medications. Continue good therapeutic lifestyle changes which include good diet and exercise. Fall precautions discussed with patient. If an FOBT was given today- please return it to our front desk. If you are over 41 years old - you may need Prevnar 25 or the adult Pneumonia vaccine.  **Flu shots are available--- please call and schedule a FLU-CLINIC appointment**  After your visit with [place] today you will receive a survey in the mail or online from [employer] regarding your care with [place]. Please take a moment to fill this out. Your feedback is very  important to [place] as you can help [place] better understand your patient needs as well as improve your experience and satisfaction. WE CARE ABOUT YOU!!!   The patient should keep the appointment with the nephrologist because of the history of fluctuating blood pressures and elevated serum calciums Stay on current treatment Return to clinic fasting for lab work Use Flonase nasal spray 1 spray each nostril at bedtime regularly Use nasal saline frequently during the day and each nostril

## 2015-03-12 NOTE — Addendum Note (Signed)
Addended by: Zannie Cove on: 03/12/2015 12:22 PM   Modules accepted: Orders

## 2015-03-12 NOTE — Progress Notes (Signed)
Subjective:    Patient ID: Lisa Sutton, female    DOB: 09-Aug-1933, 81 y.o.   MRN: [id number]  HPI Pt here for follow up and management of chronic medical problems which includes hypertension, hyperlipidemia, diabetes and hypothyroid. She is taking medications regularly. The patient has seen the gastroenterologist and he has some additional request as far as blood work is concerned and this is to check her hepatitis profile. She is feeling well and her last serum calcium was only slightly elevated. She has seen Dr. Letta Median who took her off the HCTZ and ask her to take Lasix 20 to daily instead. This may have played a role she thinks with the elevated serum calcium and the hospitalization she had several months ago. The patient today denies chest pain shortness of breath. She does have occasional heartburn that is relieved by taking an as needed Prilosec. She does have some sinus congestion and recently took a course of antibiotic and she still has some sinus congestion. She has not seen any blood in stool or had any black tarry bowel movements.    Patient Active Problem List   Diagnosis Date Noted  . Abnormal chest x-ray 11/27/2014  . Protein-calorie malnutrition, severe ([place]) 11/27/2014  . AKI (acute kidney injury) ([place]) 11/26/2014  . Hypercalcemia 11/26/2014  . Leukocytosis 11/26/2014  . Weight loss 11/26/2014  . Weakness 11/26/2014  . Insomnia 04/12/2013  . Hyperlipidemia 04/12/2013  . Essential hypertension, benign 10/13/2012  . GERD (gastroesophageal reflux disease) 04/25/2012  . Fatty liver 08/09/2011  . Hepatitis B 08/09/2011  . Dysphagia 08/09/2011  . Type 2 diabetes mellitus ([place]) 08/09/2011  . Hypothyroid 08/09/2011   Outpatient Encounter Prescriptions as of 03/12/2015  Medication Sig  . acetaminophen (TYLENOL) 500 MG tablet Take 1,000 mg by mouth every 8 (eight) hours as needed for mild pain.  Marland Kitchen ADVANCED FIBER COMPLEX PO Take by mouth daily. These are gummies  .  ALPRAZolam (XANAX) 0.25 MG tablet Take 1 tablet (0.25 mg total) by mouth 2 (two) times daily as needed for anxiety.  Marland Kitchen aspirin EC 81 MG tablet Take 81 mg by mouth daily.  Marland Kitchen atenolol (TENORMIN) 50 MG tablet Take 1 tablet (50 mg total) by mouth daily.  . Cholecalciferol (VITAMIN D) 1000 UNITS capsule Take 1,000 Units by mouth daily.    . fish oil-omega-3 fatty acids 1000 MG capsule Take 1,000 mg by mouth daily.    . furosemide (LASIX) 20 MG tablet Take 2 tablets (40 mg total) by mouth daily.  Marland Kitchen glucose blood (ACCU-CHEK AVIVA PLUS) test strip Test BS bid and PRN. Dx E11.9  . levothyroxine (LEVOTHROID) 25 MCG tablet Take 1 tablet (25 mcg total) by mouth daily before breakfast.  . levothyroxine (SYNTHROID, LEVOTHROID) 112 MCG tablet Take 1 tablet (112 mcg total) by mouth daily.  Marland Kitchen lisinopril (PRINIVIL,ZESTRIL) 20 MG tablet TAKE 1 TABLET DAILY  . meclizine (ANTIVERT) 12.5 MG tablet TAKE  (1)  TABLET  FOUR TIMES DAILY AS NEEDED DIZZINESS  . Multiple Vitamin (MULITIVITAMIN WITH MINERALS) TABS Take 1 tablet by mouth daily.   Marland Kitchen omeprazole (PRILOSEC) 20 MG capsule Take 1 capsule (20 mg total) by mouth daily. (Patient taking differently: Take 20 mg by mouth daily as needed (ACID REFLUX). )   No facility-administered encounter medications on file as of 03/12/2015.      Review of Systems  Constitutional: Negative.   HENT: Positive for congestion (nasal).   Eyes: Negative.   Respiratory: Negative.   Cardiovascular: Negative.  Gastrointestinal: Negative.   Endocrine: Negative.   Genitourinary: Negative.   Musculoskeletal: Negative.   Skin: Negative.   Allergic/Immunologic: Negative.   Neurological: Negative.   Hematological: Negative.   Psychiatric/Behavioral: Negative.        Objective:   Physical Exam  Constitutional: She is oriented to person, place, and time. She appears well-developed and well-nourished. No distress.  HENT:  Head: Normocephalic and atraumatic.  Right Ear: External ear  normal.  Left Ear: External ear normal.  Mouth/Throat: Oropharynx is clear and moist.  Nasal pallor  Eyes: Conjunctivae and EOM are normal. Pupils are equal, round, and reactive to light. Right eye exhibits no discharge. Left eye exhibits no discharge. No scleral icterus.  Neck: Normal range of motion. Neck supple. No thyromegaly present.  No thyromegaly adenopathy or carotid bruits  Cardiovascular: Normal rate, regular rhythm, normal heart sounds and intact distal pulses.   No murmur heard. The heart is regular at 72/m  Pulmonary/Chest: Effort normal and breath sounds normal. No respiratory distress. She has no wheezes. She has no rales. She exhibits no tenderness.  Clear anteriorly and posteriorly  Abdominal: Soft. Bowel sounds are normal. She exhibits no mass. There is no tenderness. There is no rebound and no guarding.  No abdominal tenderness or bruits  Musculoskeletal: Normal range of motion. She exhibits no edema.  Lymphadenopathy:    She has no cervical adenopathy.  Neurological: She is alert and oriented to person, place, and time. She has normal reflexes. No cranial nerve deficit.  Skin: Skin is warm and dry. No rash noted.  Psychiatric: She has a normal mood and affect. Her behavior is normal. Judgment and thought content normal.  Nursing note and vitals reviewed.   BP 143/78 mmHg  Pulse 58  Temp(Src) 97.7 F (36.5 C) (Oral)  Ht 5' 3"  (1.6 m)  Wt 162 lb (73.483 kg)  BMI 28.70 kg/m2       Assessment & Plan:  1. Essential hypertension, benign -The patient's blood pressure is fluctuating. Today it was 143/78 she has other readings from home that are higher than this. - BMP8+EGFR; Future - CBC with Differential/Platelet; Future - Hepatic function panel; Future - NMR, lipoprofile; Future - Hepatitis B surface antibody; Future  2. Hypothyroidism, unspecified hypothyroidism type -Energy wise the patient feels good and any change in thyroid treatment will be secondary  to results of lab work - CBC with Differential/Platelet; Future - Thyroid Panel With TSH; Future  3. Type 2 diabetes mellitus without complication, without long-term current use of insulin ([place]) -Continue aggressive therapeutic lifestyle changes. The patient should bring blood sugars in with her for review at future visit - POCT glycosylated hemoglobin (Hb A1C); Future - CBC with Differential/Platelet; Future  4. Hyperlipidemia -The patient has hyperlipidemia with statin intolerance. She is taking omega-3 fatty acids and will continue to do her best with working with her diet and exercise regimen. No change in treatment pending results of lab work - CBC with Differential/Platelet; Future - NMR, lipoprofile; Future  5. Vitamin D deficiency -Patient will continue with vitamin D3 1000 daily - CBC with Differential/Platelet; Future - Vit D  25 hydroxy (rtn osteoporosis monitoring); Future  6. Gastroesophageal reflux disease without esophagitis -She will continue to take when necessary Prilosec and watch her diet more closely - CBC with Differential/Platelet; Future - Hepatic function panel; Future  7. Hypercalcemia -We will recheck this again today and she has a visit scheduled with the nephrologist within the next month.  Meds ordered this  encounter  Medications  . ALPRAZolam (XANAX) 0.25 MG tablet    Sig: Take 1 tablet (0.25 mg total) by mouth 2 (two) times daily as needed for anxiety.    Dispense:  180 tablet    Refill:  1   Patient Instructions                       Medicare Annual Wellness Visit  [facility] and the medical providers at [facility] strive to bring you the best medical care.  In doing so we not only want to address your current medical conditions and concerns but also to detect new conditions early and prevent illness, disease and health-related problems.    Medicare offers a yearly Wellness Visit which allows our clinical staff to assess  your need for preventative services including immunizations, lifestyle education, counseling to decrease risk of preventable diseases and screening for fall risk and other medical concerns.    This visit is provided free of charge (no copay) for all Medicare recipients. The clinical pharmacists at [facility] have begun to conduct these Wellness Visits which will also include a thorough review of all your medications.    As you primary medical provider recommend that you make an appointment for your Annual Wellness Visit if you have not done so already this year.  You may set up this appointment before you leave today or you may call back ([phone]) and schedule an appointment.  Please make sure when you call that you mention that you are scheduling your Annual Wellness Visit with the clinical pharmacist so that the appointment may be made for the proper length of time.     Continue current medications. Continue good therapeutic lifestyle changes which include good diet and exercise. Fall precautions discussed with patient. If an FOBT was given today- please return it to our front desk. If you are over 77 years old - you may need Prevnar 73 or the adult Pneumonia vaccine.  **Flu shots are available--- please call and schedule a FLU-CLINIC appointment**  After your visit with [place] today you will receive a survey in the mail or online from [employer] regarding your care with [place]. Please take a moment to fill this out. Your feedback is very important to [place] as you can help [place] better understand your patient needs as well as improve your experience and satisfaction. WE CARE ABOUT YOU!!!   The patient should keep the appointment with the nephrologist because of the history of fluctuating blood pressures and elevated serum calciums Stay on current treatment Return to clinic fasting for lab work Use Flonase nasal spray 1 spray each nostril at bedtime regularly Use nasal saline  frequently during the day and each nostril   Arrie Senate MD

## 2015-03-13 ENCOUNTER — Other Ambulatory Visit (INDEPENDENT_AMBULATORY_CARE_PROVIDER_SITE_OTHER): Payer: Medicare Other

## 2015-03-13 DIAGNOSIS — E559 Vitamin D deficiency, unspecified: Secondary | ICD-10-CM | POA: Diagnosis not present

## 2015-03-13 DIAGNOSIS — K219 Gastro-esophageal reflux disease without esophagitis: Secondary | ICD-10-CM

## 2015-03-13 DIAGNOSIS — B181 Chronic viral hepatitis B without delta-agent: Secondary | ICD-10-CM

## 2015-03-13 DIAGNOSIS — E785 Hyperlipidemia, unspecified: Secondary | ICD-10-CM

## 2015-03-13 DIAGNOSIS — I10 Essential (primary) hypertension: Secondary | ICD-10-CM | POA: Diagnosis not present

## 2015-03-13 DIAGNOSIS — E119 Type 2 diabetes mellitus without complications: Secondary | ICD-10-CM

## 2015-03-13 DIAGNOSIS — E039 Hypothyroidism, unspecified: Secondary | ICD-10-CM

## 2015-03-13 LAB — POCT GLYCOSYLATED HEMOGLOBIN (HGB A1C): Hemoglobin A1C: 6.1

## 2015-03-13 NOTE — Addendum Note (Signed)
Addended by: Earlene Plater on: 03/13/2015 08:21 AM   Modules accepted: Orders

## 2015-03-13 NOTE — Progress Notes (Signed)
Lab only 

## 2015-03-14 LAB — CBC WITH DIFFERENTIAL/PLATELET
BASOS ABS: 0.1 10*3/uL (ref 0.0–0.2)
Basos: 1 %
EOS (ABSOLUTE): 0.3 10*3/uL (ref 0.0–0.4)
Eos: 4 %
Hematocrit: 39 % (ref 34.0–46.6)
Hemoglobin: 13.2 g/dL (ref 11.1–15.9)
IMMATURE GRANULOCYTES: 1 %
Immature Grans (Abs): 0.1 10*3/uL (ref 0.0–0.1)
LYMPHS ABS: 2.2 10*3/uL (ref 0.7–3.1)
Lymphs: 33 %
MCH: 30.4 pg (ref 26.6–33.0)
MCHC: 33.8 g/dL (ref 31.5–35.7)
MCV: 90 fL (ref 79–97)
MONOS ABS: 0.7 10*3/uL (ref 0.1–0.9)
Monocytes: 11 %
NEUTROS PCT: 50 %
Neutrophils Absolute: 3.4 10*3/uL (ref 1.4–7.0)
PLATELETS: 223 10*3/uL (ref 150–379)
RBC: 4.34 x10E6/uL (ref 3.77–5.28)
RDW: 13.9 % (ref 12.3–15.4)
WBC: 6.7 10*3/uL (ref 3.4–10.8)

## 2015-03-14 LAB — HEPATIC FUNCTION PANEL
ALBUMIN: 4 g/dL (ref 3.5–4.8)
ALT: 13 IU/L (ref 0–32)
AST: 15 IU/L (ref 0–40)
Alkaline Phosphatase: 73 IU/L (ref 39–117)
BILIRUBIN TOTAL: 0.2 mg/dL (ref 0.0–1.2)
Bilirubin, Direct: 0.06 mg/dL (ref 0.00–0.40)
TOTAL PROTEIN: 6.6 g/dL (ref 6.0–8.5)

## 2015-03-14 LAB — THYROID PANEL WITH TSH
FREE THYROXINE INDEX: 3.3 (ref 1.2–4.9)
T3 Uptake Ratio: 30 % (ref 24–39)
T4, Total: 10.9 ug/dL (ref 4.5–12.0)
TSH: 1.3 u[IU]/mL (ref 0.450–4.500)

## 2015-03-14 LAB — HEPATITIS B SURFACE ANTIBODY,QUALITATIVE: Hep B Surface Ab, Qual: NONREACTIVE

## 2015-03-14 LAB — NMR, LIPOPROFILE
CHOLESTEROL: 303 mg/dL — AB (ref 100–199)
HDL CHOLESTEROL BY NMR: 48 mg/dL (ref >39)
HDL PARTICLE NUMBER: 32.8 umol/L (ref >=30.5)
LDL Particle Number: 2426 nmol/L — ABNORMAL HIGH (ref <1000)
LDL Size: 20.5 nm (ref >20.5)
LDL-C: 192 mg/dL — AB (ref 0–99)
LP-IR Score: 73 — ABNORMAL HIGH (ref <=45)
SMALL LDL PARTICLE NUMBER: 1228 nmol/L — AB (ref <=527)
TRIGLYCERIDES BY NMR: 314 mg/dL — AB (ref 0–149)

## 2015-03-14 LAB — BMP8+EGFR
BUN/Creatinine Ratio: 17 (ref 11–26)
BUN: 21 mg/dL (ref 8–27)
CALCIUM: 10.2 mg/dL (ref 8.7–10.3)
CHLORIDE: 100 mmol/L (ref 97–106)
CO2: 28 mmol/L (ref 18–29)
CREATININE: 1.27 mg/dL — AB (ref 0.57–1.00)
GFR, EST AFRICAN AMERICAN: 46 mL/min/{1.73_m2} — AB (ref >59)
GFR, EST NON AFRICAN AMERICAN: 40 mL/min/{1.73_m2} — AB (ref >59)
Glucose: 119 mg/dL — ABNORMAL HIGH (ref 65–99)
Potassium: 4.6 mmol/L (ref 3.5–5.2)
Sodium: 143 mmol/L (ref 136–144)

## 2015-03-14 LAB — VITAMIN D 25 HYDROXY (VIT D DEFICIENCY, FRACTURES): VIT D 25 HYDROXY: 37.8 ng/mL (ref 30.0–100.0)

## 2015-03-14 LAB — HEPATITIS B SURFACE ANTIGEN: Hepatitis B Surface Ag: NEGATIVE

## 2015-03-19 ENCOUNTER — Telehealth (INDEPENDENT_AMBULATORY_CARE_PROVIDER_SITE_OTHER): Payer: Self-pay | Admitting: *Deleted

## 2015-03-19 NOTE — Telephone Encounter (Signed)
Lisa Sutton had her labs done at PCP office and are in the computer/EPIC. Please let Dr. Laural Golden know.

## 2015-03-19 NOTE — Telephone Encounter (Signed)
Forwarded to Dr.Rehman. 

## 2015-03-20 NOTE — Telephone Encounter (Signed)
Patient called and results reviewed. Hepatitis B surface antigen is undetectable. Hepatitis B surface antibody is absent. Therefore she has not developed immunity to hepatitis B. She is an inactive carrier

## 2015-04-01 ENCOUNTER — Other Ambulatory Visit (HOSPITAL_COMMUNITY): Payer: Self-pay | Admitting: Nephrology

## 2015-04-01 DIAGNOSIS — N289 Disorder of kidney and ureter, unspecified: Secondary | ICD-10-CM

## 2015-04-01 DIAGNOSIS — N189 Chronic kidney disease, unspecified: Secondary | ICD-10-CM | POA: Diagnosis not present

## 2015-04-01 DIAGNOSIS — E1129 Type 2 diabetes mellitus with other diabetic kidney complication: Secondary | ICD-10-CM | POA: Diagnosis not present

## 2015-04-01 DIAGNOSIS — I10 Essential (primary) hypertension: Secondary | ICD-10-CM | POA: Diagnosis not present

## 2015-04-14 ENCOUNTER — Ambulatory Visit (HOSPITAL_COMMUNITY)
Admission: RE | Admit: 2015-04-14 | Discharge: 2015-04-14 | Disposition: A | Discharge status: Home / Self Care | Payer: Medicare Other | Source: Ambulatory Visit | Attending: Nephrology | Admitting: Nephrology

## 2015-04-14 DIAGNOSIS — E1129 Type 2 diabetes mellitus with other diabetic kidney complication: Secondary | ICD-10-CM | POA: Diagnosis not present

## 2015-04-14 DIAGNOSIS — N2889 Other specified disorders of kidney and ureter: Secondary | ICD-10-CM | POA: Diagnosis not present

## 2015-04-14 DIAGNOSIS — N289 Disorder of kidney and ureter, unspecified: Secondary | ICD-10-CM | POA: Insufficient documentation

## 2015-04-14 DIAGNOSIS — N281 Cyst of kidney, acquired: Secondary | ICD-10-CM | POA: Insufficient documentation

## 2015-04-14 DIAGNOSIS — D649 Anemia, unspecified: Secondary | ICD-10-CM | POA: Diagnosis not present

## 2015-04-14 DIAGNOSIS — I10 Essential (primary) hypertension: Secondary | ICD-10-CM | POA: Diagnosis not present

## 2015-04-14 DIAGNOSIS — Z79899 Other long term (current) drug therapy: Secondary | ICD-10-CM | POA: Diagnosis not present

## 2015-04-16 DIAGNOSIS — N183 Chronic kidney disease, stage 3 (moderate): Secondary | ICD-10-CM | POA: Diagnosis not present

## 2015-04-16 DIAGNOSIS — Z79899 Other long term (current) drug therapy: Secondary | ICD-10-CM | POA: Diagnosis not present

## 2015-04-16 DIAGNOSIS — I10 Essential (primary) hypertension: Secondary | ICD-10-CM | POA: Diagnosis not present

## 2015-04-26 ENCOUNTER — Encounter (HOSPITAL_COMMUNITY): Payer: Self-pay | Admitting: Emergency Medicine

## 2015-04-26 ENCOUNTER — Emergency Department (HOSPITAL_COMMUNITY)
Admission: EM | Admit: 2015-04-26 | Discharge: 2015-04-26 | Disposition: A | Discharge status: Home / Self Care | Payer: Medicare Other | Attending: Emergency Medicine | Admitting: Emergency Medicine

## 2015-04-26 ENCOUNTER — Emergency Department (HOSPITAL_COMMUNITY): Payer: Medicare Other

## 2015-04-26 DIAGNOSIS — I10 Essential (primary) hypertension: Secondary | ICD-10-CM | POA: Insufficient documentation

## 2015-04-26 DIAGNOSIS — R030 Elevated blood-pressure reading, without diagnosis of hypertension: Secondary | ICD-10-CM

## 2015-04-26 DIAGNOSIS — I771 Stricture of artery: Secondary | ICD-10-CM | POA: Diagnosis not present

## 2015-04-26 DIAGNOSIS — E669 Obesity, unspecified: Secondary | ICD-10-CM | POA: Insufficient documentation

## 2015-04-26 DIAGNOSIS — Z87442 Personal history of urinary calculi: Secondary | ICD-10-CM | POA: Diagnosis not present

## 2015-04-26 DIAGNOSIS — R221 Localized swelling, mass and lump, neck: Secondary | ICD-10-CM | POA: Diagnosis not present

## 2015-04-26 DIAGNOSIS — E119 Type 2 diabetes mellitus without complications: Secondary | ICD-10-CM | POA: Insufficient documentation

## 2015-04-26 DIAGNOSIS — K219 Gastro-esophageal reflux disease without esophagitis: Secondary | ICD-10-CM | POA: Diagnosis not present

## 2015-04-26 DIAGNOSIS — Z7982 Long term (current) use of aspirin: Secondary | ICD-10-CM | POA: Diagnosis not present

## 2015-04-26 DIAGNOSIS — Z8619 Personal history of other infectious and parasitic diseases: Secondary | ICD-10-CM | POA: Diagnosis not present

## 2015-04-26 DIAGNOSIS — Z79899 Other long term (current) drug therapy: Secondary | ICD-10-CM | POA: Insufficient documentation

## 2015-04-26 DIAGNOSIS — E039 Hypothyroidism, unspecified: Secondary | ICD-10-CM | POA: Insufficient documentation

## 2015-04-26 DIAGNOSIS — R42 Dizziness and giddiness: Secondary | ICD-10-CM | POA: Diagnosis not present

## 2015-04-26 DIAGNOSIS — IMO0001 Reserved for inherently not codable concepts without codable children: Secondary | ICD-10-CM

## 2015-04-26 LAB — BASIC METABOLIC PANEL
Anion gap: 8 (ref 5–15)
BUN: 27 mg/dL — ABNORMAL HIGH (ref 6–20)
CALCIUM: 11.8 mg/dL — AB (ref 8.9–10.3)
CHLORIDE: 102 mmol/L (ref 101–111)
CO2: 27 mmol/L (ref 22–32)
CREATININE: 1.82 mg/dL — AB (ref 0.44–1.00)
GFR calc non Af Amer: 25 mL/min — ABNORMAL LOW (ref >60)
GFR, EST AFRICAN AMERICAN: 29 mL/min — AB (ref >60)
Glucose, Bld: 99 mg/dL (ref 65–99)
Potassium: 4.4 mmol/L (ref 3.5–5.1)
SODIUM: 137 mmol/L (ref 135–145)

## 2015-04-26 LAB — CBC WITH DIFFERENTIAL/PLATELET
BASOS ABS: 0 10*3/uL (ref 0.0–0.1)
BASOS PCT: 1 %
EOS ABS: 0.1 10*3/uL (ref 0.0–0.7)
Eosinophils Relative: 1 %
HCT: 38.3 % (ref 36.0–46.0)
HEMOGLOBIN: 12.8 g/dL (ref 12.0–15.0)
Lymphocytes Relative: 28 %
Lymphs Abs: 2.2 10*3/uL (ref 0.7–4.0)
MCH: 30.4 pg (ref 26.0–34.0)
MCHC: 33.4 g/dL (ref 30.0–36.0)
MCV: 91 fL (ref 78.0–100.0)
MONOS PCT: 6 %
Monocytes Absolute: 0.5 10*3/uL (ref 0.1–1.0)
NEUTROS PCT: 64 %
Neutro Abs: 5.2 10*3/uL (ref 1.7–7.7)
Platelets: 249 10*3/uL (ref 150–400)
RBC: 4.21 MIL/uL (ref 3.87–5.11)
RDW: 12.5 % (ref 11.5–15.5)
WBC: 8.1 10*3/uL (ref 4.0–10.5)

## 2015-04-26 LAB — URINALYSIS, ROUTINE W REFLEX MICROSCOPIC
BILIRUBIN URINE: NEGATIVE
GLUCOSE, UA: NEGATIVE mg/dL
HGB URINE DIPSTICK: NEGATIVE
KETONES UR: NEGATIVE mg/dL
Nitrite: NEGATIVE
PROTEIN: NEGATIVE mg/dL
Specific Gravity, Urine: 1.009 (ref 1.005–1.030)
pH: 7 (ref 5.0–8.0)

## 2015-04-26 LAB — URINE MICROSCOPIC-ADD ON: Bacteria, UA: NONE SEEN

## 2015-04-26 LAB — I-STAT TROPONIN, ED: TROPONIN I, POC: 0 ng/mL (ref 0.00–0.08)

## 2015-04-26 MED ORDER — CLONIDINE HCL 0.2 MG PO TABS
0.2000 mg | ORAL_TABLET | Freq: Once | ORAL | Status: AC
  Administered 2015-04-26: 0.2 mg via ORAL
  Filled 2015-04-26: qty 1

## 2015-04-26 NOTE — ED Provider Notes (Signed)
CSN: [id number]     Arrival date & time 04/26/15  1450 History   First MD Initiated Contact with Patient 04/26/15 1607     Chief Complaint  Patient presents with  . Hypertension  . Dizziness     (Consider location/radiation/quality/duration/timing/severity/associated sxs/prior Treatment) HPI Patient with past medical history of essential hypertension and is currently taking atenolol and lisinopril for this. She presents from urgent care where her chief complaint was uncontrolled hypertension. She states that over the last few months her blood pressure has been steadily increasing. She is not had any recent changes to her blood pressure medication or dosage. She denies missing any doses. She does have mild lightheadedness at times but denies any headache, chest pain, shortness of breath, focal weakness or numbness. She was sent from urgent care to be evaluated due to the new finding of a right sided anterior cervical pulsatile mass. Patient states she has not noticed the mass before. She denies any pain to the area. No known trauma. Past Medical History  Diagnosis Date  . Type 2 diabetes mellitus ([place])   . Essential hypertension, benign   . GERD (gastroesophageal reflux disease)   . Osteoporosis   . Obesity   . Hypothyroidism   . Dysphagia   . Hepatitis B   . Fatty liver   . Kidney stone August 2014    Patient was seen at [facility]   Past Surgical History  Procedure Laterality Date  . Abdominal hysterectomy    . Deviated septum repair    . Knee arthroscopy      left knee 2003  . Cataract extraction, bilateral     Family History  Problem Relation Age of Onset  . Hypertension Father   . Cancer Maternal Aunt    Social History  Substance Use Topics  . Smoking status: Never Smoker   . Smokeless tobacco: Never Used  . Alcohol Use: No   OB History    Gravida Para Term Preterm AB TAB SAB Ectopic Multiple Living            1     Review of Systems  Constitutional: Negative for  fever and chills.  Eyes: Negative for visual disturbance.  Respiratory: Negative for cough and shortness of breath.   Cardiovascular: Negative for chest pain, palpitations and leg swelling.  Gastrointestinal: Negative for nausea, vomiting and abdominal pain.  Musculoskeletal: Negative for myalgias, back pain, arthralgias, neck pain and neck stiffness.  Skin: Negative for rash and wound.  Neurological: Positive for dizziness and light-headedness. Negative for syncope, weakness, numbness and headaches.  All other systems reviewed and are negative.     Allergies  Zetia; Livalo; and Zocor  Home Medications   Prior to Admission medications   Medication Sig Start Date End Date Taking? Authorizing Provider  acetaminophen (TYLENOL) 500 MG tablet Take 1,000 mg by mouth every 8 (eight) hours as needed for mild pain.   Yes Historical Provider, MD  ALPRAZolam (XANAX) 0.25 MG tablet Take 1 tablet (0.25 mg total) by mouth 2 (two) times daily as needed for anxiety. 03/12/15  Yes [name], MD  aspirin EC 81 MG tablet Take 81 mg by mouth daily.   Yes Historical Provider, MD  atenolol (TENORMIN) 50 MG tablet Take 1 tablet (50 mg total) by mouth daily. 05/31/14  Yes [name], MD  fish oil-omega-3 fatty acids 1000 MG capsule Take 1,000 mg by mouth daily.     Yes Historical Provider, MD  furosemide (LASIX) 20  MG tablet Take 2 tablets (40 mg total) by mouth daily. 03/12/15  Yes [name], MD  glucose blood (ACCU-CHEK AVIVA PLUS) test strip Test BS bid and PRN. Dx E11.9 06/21/14  Yes [name], MD  levothyroxine (LEVOTHROID) 25 MCG tablet Take 1 tablet (25 mcg total) by mouth daily before breakfast. 03/12/15  Yes [name], MD  levothyroxine (SYNTHROID, LEVOTHROID) 112 MCG tablet Take 1 tablet (112 mcg total) by mouth daily. 03/12/15  Yes [name], MD  lisinopril (PRINIVIL,ZESTRIL) 20 MG tablet Take 1 tablet (20 mg total) by mouth daily. 03/12/15  Yes [name], MD   meclizine (ANTIVERT) 12.5 MG tablet TAKE  (1)  TABLET  FOUR TIMES DAILY AS NEEDED DIZZINESS 12/02/14  Yes [name], MD  metFORMIN (GLUCOPHAGE) 500 MG tablet Take 1 tablet (500 mg total) by mouth 2 (two) times daily with a meal. 03/12/15  Yes [name], MD  omeprazole (PRILOSEC) 20 MG capsule Take 1 capsule (20 mg total) by mouth daily. Patient taking differently: Take 20 mg by mouth daily as needed (ACID REFLUX).  05/31/14  Yes [name], MD   BP 153/102 mmHg  Pulse 33  Resp 18  SpO2 95% Physical Exam  Constitutional: She is oriented to person, place, and time. She appears well-developed and well-nourished. No distress.  HENT:  Head: Normocephalic and atraumatic.  Mouth/Throat: Oropharynx is clear and moist. No oropharyngeal exudate.  Eyes: EOM are normal. Pupils are equal, round, and reactive to light.  Neck: Normal range of motion. Neck supple.  Patient has 3 cm in diameter pulsatile mass in the region of the right carotid. There is no warmth, tenderness or evidence of trauma. Mild bruit auscultated. No overlying skin changes.  Cardiovascular: Normal rate and regular rhythm.  Exam reveals no gallop and no friction rub.   No murmur heard. Pulmonary/Chest: Effort normal and breath sounds normal. No respiratory distress. She has no wheezes. She has no rales.  Abdominal: Soft. Bowel sounds are normal. She exhibits no distension and no mass. There is no tenderness. There is no rebound and no guarding.  Musculoskeletal: Normal range of motion. She exhibits no edema or tenderness.  2+ pulses in all extremity.  Neurological: She is alert and oriented to person, place, and time.  Patient is alert and oriented x3 with clear, goal oriented speech. Patient has 5/5 motor in all extremities. Sensation is intact to light touch. Bilateral finger-to-nose is normal with no signs of dysmetria. Patient has a normal gait and walks without assistance.  Skin: Skin is warm and dry. No rash noted.  No erythema.  Psychiatric: She has a normal mood and affect. Her behavior is normal.  Nursing note and vitals reviewed.   ED Course  Procedures (including critical care time) Labs Review Labs Reviewed  BASIC METABOLIC PANEL - Abnormal; Notable for the following:    BUN 27 (*)    Creatinine, Ser 1.82 (*)    Calcium 11.8 (*)    GFR calc non Af Amer 25 (*)    GFR calc Af Amer 29 (*)    All other components within normal limits  URINALYSIS, ROUTINE W REFLEX MICROSCOPIC (NOT AT [facility]) - Abnormal; Notable for the following:    Leukocytes, UA TRACE (*)    All other components within normal limits  URINE MICROSCOPIC-ADD ON - Abnormal; Notable for the following:    Squamous Epithelial / LPF 0-5 (*)    All other components within normal  limits  CBC WITH DIFFERENTIAL/PLATELET  Randolm Idol, ED    Imaging Review Ct Soft Tissue Neck Wo Contrast  04/26/2015  CLINICAL DATA:  Pulsatile right-sided neck mass. Renal insufficiency. Personal history of lymphoma EXAM: CT NECK WITHOUT CONTRAST TECHNIQUE: Multidetector CT imaging of the neck was performed following the standard protocol without intravenous contrast. COMPARISON:  Thyroid ultrasound 12/16/2014.  PET scan 03/09/2007. FINDINGS: Pharynx and larynx: No focal mucosal or submucosal lesions are present. The vocal cords are midline and symmetric. The tongue base is unremarkable. Salivary glands: The submandibular and parotid glands are within normal limits bilaterally. Thyroid: A calcified nodule in the left lobe is again noted. The gland is atrophic. Lymph nodes: No significant cervical adenopathy is present. A elongated right jugulodigastric lymph node is within normal limits. Vascular: There is tortuosity and ectasia of the innominate artery extending laterally to just below the area marked. The skin marker is present near the level of the right carotid bifurcation. There is no aneurysm or mass lesion. Atherosclerotic calcifications are present at  the origin of the left subclavian artery without significant stenosis. There is mild tortuosity of the left common carotid artery as well. Vascular calcifications are present at the carotid bifurcations bilaterally, left greater than right. Vascular calcifications are present within the cavernous internal carotid arteries bilaterally. Limited intracranial: Within normal limits. Visualized orbits: Negative Mastoids and visualized paranasal sinuses: Clear Skeleton: Degenerative anterolisthesis is present at C3-4 and C4-5 with moderate facet hypertrophy at both levels. Chronic loss of disc height and uncovertebral spurring is present C5-6 and C6-7. No focal lytic or blastic lesions are present. Upper chest: The lung apices are clear IMPRESSION: 1. Ectasia and tortuosity of the innominate artery and proximal right common carotid artery extending laterally near the skin surface just below the area marked on the skin. 2. No focal aneurysm or mass lesion is present. 3. Minimal calcifications at the origin of the left subclavian artery and left carotid bifurcation without significant stenosis. 4. Moderate spondylosis in the cervical spine. 5. Atrophic thyroid with a calcified nodule on the left. Electronically Signed   By: San Morelle M.D.   On: 04/26/2015 19:32   I have personally reviewed and evaluated these images and lab results as part of my medical decision-making.   EKG Interpretation   Date/Time:  Saturday April 26 2015 16:15:47 EST Ventricular Rate:  63 PR Interval:  196 QRS Duration: 82 QT Interval:  393 QTC Calculation: 402 R Axis:   27 Text Interpretation:  Sinus rhythm Low voltage, precordial leads Confirmed  by Lita Mains  MD, Shanique Aslinger ([id number]) on 04/26/2015 8:54:19 PM      MDM   Final diagnoses:  Tortuosity of artery (HCC)  Elevated blood pressure    Discussed with Dr. Oneida Alar. States the patient can follow-up as an outpatient for further investigation and treatment. Suggested  possible MR for evaluation given the patient's elevated creatinine and the inability to get a carotid Doppler. Discussed with the neuroradiologist who suggested starting with a noncontrasted CT study.   CT without evidence of aneurysm or mass. Ectasia and tortuosity of the innominate artery and proximal right carotid visualized. Possibly more prominent due to patient's weight loss. Patient remains a symptomatically in the emergency department. Blood pressure has improved. Patient will follow-up with her primary physician on Monday for recheck of blood pressure and adjustment of medications as needed. She understands return precautions  Julianne Rice, MD 04/26/15 2055

## 2015-04-26 NOTE — ED Notes (Signed)
Pt. Stated, My BP is gradually going up since July , I've been dizzy and the doctor said I had something going on with my neck. Not sure if he said anneuresym

## 2015-04-26 NOTE — ED Notes (Signed)
Pt departed in NAD.  

## 2015-04-26 NOTE — Discharge Instructions (Signed)
Hypertension Hypertension, commonly called high blood pressure, is when the force of blood pumping through your arteries is too strong. Your arteries are the blood vessels that carry blood from your heart throughout your body. A blood pressure reading consists of a higher number over a lower number, such as 110/72. The higher number (systolic) is the pressure inside your arteries when your heart pumps. The lower number (diastolic) is the pressure inside your arteries when your heart relaxes. Ideally you want your blood pressure below 120/80. Hypertension forces your heart to work harder to pump blood. Your arteries may become narrow or stiff. Having untreated or uncontrolled hypertension can cause heart attack, stroke, kidney disease, and other problems. RISK FACTORS Some risk factors for high blood pressure are controllable. Others are not.  Risk factors you cannot control include:   Race. You may be at higher risk if you are African American.  Age. Risk increases with age.  Gender. Men are at higher risk than women before age 45 years. After age 65, women are at higher risk than men. Risk factors you can control include:  Not getting enough exercise or physical activity.  Being overweight.  Getting too much fat, sugar, calories, or salt in your diet.  Drinking too much alcohol. SIGNS AND SYMPTOMS Hypertension does not usually cause signs or symptoms. Extremely high blood pressure (hypertensive crisis) may cause headache, anxiety, shortness of breath, and nosebleed. DIAGNOSIS To check if you have hypertension, your health care provider will measure your blood pressure while you are seated, with your arm held at the level of your heart. It should be measured at least twice using the same arm. Certain conditions can cause a difference in blood pressure between your right and left arms. A blood pressure reading that is higher than normal on one occasion does not mean that you need treatment. If  it is not clear whether you have high blood pressure, you may be asked to return on a different day to have your blood pressure checked again. Or, you may be asked to monitor your blood pressure at home for 1 or more weeks. TREATMENT Treating high blood pressure includes making lifestyle changes and possibly taking medicine. Living a healthy lifestyle can help lower high blood pressure. You may need to change some of your habits. Lifestyle changes may include:  Following the DASH diet. This diet is high in fruits, vegetables, and whole grains. It is low in salt, red meat, and added sugars.  Keep your sodium intake below 2,300 mg per day.  Getting at least 30-45 minutes of aerobic exercise at least 4 times per week.  Losing weight if necessary.  Not smoking.  Limiting alcoholic beverages.  Learning ways to reduce stress. Your health care provider may prescribe medicine if lifestyle changes are not enough to get your blood pressure under control, and if one of the following is true:  You are 18-59 years of age and your systolic blood pressure is above 140.  You are 60 years of age or older, and your systolic blood pressure is above 150.  Your diastolic blood pressure is above 90.  You have diabetes, and your systolic blood pressure is over 140 or your diastolic blood pressure is over 90.  You have kidney disease and your blood pressure is above 140/90.  You have heart disease and your blood pressure is above 140/90. Your personal target blood pressure may vary depending on your medical conditions, your age, and other factors. HOME CARE INSTRUCTIONS    Have your blood pressure rechecked as directed by your health care provider.   Take medicines only as directed by your health care provider. Follow the directions carefully. Blood pressure medicines must be taken as prescribed. The medicine does not work as well when you skip doses. Skipping doses also puts you at risk for  problems.  Do not smoke.   Monitor your blood pressure at home as directed by your health care provider. SEEK MEDICAL CARE IF:   You think you are having a reaction to medicines taken.  You have recurrent headaches or feel dizzy.  You have swelling in your ankles.  You have trouble with your vision. SEEK IMMEDIATE MEDICAL CARE IF:  You develop a severe headache or confusion.  You have unusual weakness, numbness, or feel faint.  You have severe chest or abdominal pain.  You vomit repeatedly.  You have trouble breathing. MAKE SURE YOU:   Understand these instructions.  Will watch your condition.  Will get help right away if you are not doing well or get worse.   This information is not intended to replace advice given to you by your health care provider. Make sure you discuss any questions you have with your health care provider.   Document Released: 05/10/2005 Document Revised: 09/24/2014 Document Reviewed: 03/02/2013 Elsevier Interactive Patient Education 2016 [employer]  

## 2015-04-28 ENCOUNTER — Emergency Department (HOSPITAL_COMMUNITY): Payer: Medicare Other

## 2015-04-28 ENCOUNTER — Emergency Department (HOSPITAL_COMMUNITY)
Admission: EM | Admit: 2015-04-28 | Discharge: 2015-04-28 | Disposition: A | Discharge status: Home / Self Care | Payer: Medicare Other | Attending: Emergency Medicine | Admitting: Emergency Medicine

## 2015-04-28 ENCOUNTER — Encounter (HOSPITAL_COMMUNITY): Payer: Self-pay | Admitting: Emergency Medicine

## 2015-04-28 DIAGNOSIS — M6281 Muscle weakness (generalized): Secondary | ICD-10-CM | POA: Insufficient documentation

## 2015-04-28 DIAGNOSIS — Z87442 Personal history of urinary calculi: Secondary | ICD-10-CM | POA: Diagnosis not present

## 2015-04-28 DIAGNOSIS — Z7984 Long term (current) use of oral hypoglycemic drugs: Secondary | ICD-10-CM | POA: Insufficient documentation

## 2015-04-28 DIAGNOSIS — K219 Gastro-esophageal reflux disease without esophagitis: Secondary | ICD-10-CM | POA: Insufficient documentation

## 2015-04-28 DIAGNOSIS — Z8619 Personal history of other infectious and parasitic diseases: Secondary | ICD-10-CM | POA: Insufficient documentation

## 2015-04-28 DIAGNOSIS — R531 Weakness: Secondary | ICD-10-CM | POA: Diagnosis not present

## 2015-04-28 DIAGNOSIS — I10 Essential (primary) hypertension: Secondary | ICD-10-CM | POA: Insufficient documentation

## 2015-04-28 DIAGNOSIS — E669 Obesity, unspecified: Secondary | ICD-10-CM | POA: Insufficient documentation

## 2015-04-28 DIAGNOSIS — R5383 Other fatigue: Secondary | ICD-10-CM | POA: Diagnosis not present

## 2015-04-28 DIAGNOSIS — Z7982 Long term (current) use of aspirin: Secondary | ICD-10-CM | POA: Diagnosis not present

## 2015-04-28 DIAGNOSIS — R630 Anorexia: Secondary | ICD-10-CM | POA: Insufficient documentation

## 2015-04-28 DIAGNOSIS — E119 Type 2 diabetes mellitus without complications: Secondary | ICD-10-CM | POA: Diagnosis not present

## 2015-04-28 DIAGNOSIS — Z79899 Other long term (current) drug therapy: Secondary | ICD-10-CM | POA: Insufficient documentation

## 2015-04-28 DIAGNOSIS — R4182 Altered mental status, unspecified: Secondary | ICD-10-CM | POA: Diagnosis not present

## 2015-04-28 DIAGNOSIS — E039 Hypothyroidism, unspecified: Secondary | ICD-10-CM | POA: Diagnosis not present

## 2015-04-28 LAB — HEPATIC FUNCTION PANEL
ALT: 11 U/L — ABNORMAL LOW (ref 14–54)
AST: 19 U/L (ref 15–41)
Albumin: 3.7 g/dL (ref 3.5–5.0)
Alkaline Phosphatase: 59 U/L (ref 38–126)
TOTAL PROTEIN: 7.2 g/dL (ref 6.5–8.1)
Total Bilirubin: 0.4 mg/dL (ref 0.3–1.2)

## 2015-04-28 LAB — CBC WITH DIFFERENTIAL/PLATELET
BASOS ABS: 0 10*3/uL (ref 0.0–0.1)
Basophils Relative: 0 %
EOS ABS: 0.1 10*3/uL (ref 0.0–0.7)
EOS PCT: 1 %
HCT: 38.9 % (ref 36.0–46.0)
Hemoglobin: 13.1 g/dL (ref 12.0–15.0)
LYMPHS ABS: 2.5 10*3/uL (ref 0.7–4.0)
LYMPHS PCT: 29 %
MCH: 30.9 pg (ref 26.0–34.0)
MCHC: 33.7 g/dL (ref 30.0–36.0)
MCV: 91.7 fL (ref 78.0–100.0)
MONO ABS: 0.8 10*3/uL (ref 0.1–1.0)
Monocytes Relative: 10 %
Neutro Abs: 5.1 10*3/uL (ref 1.7–7.7)
Neutrophils Relative %: 60 %
PLATELETS: 269 10*3/uL (ref 150–400)
RBC: 4.24 MIL/uL (ref 3.87–5.11)
RDW: 12.5 % (ref 11.5–15.5)
WBC: 8.5 10*3/uL (ref 4.0–10.5)

## 2015-04-28 LAB — URINALYSIS, ROUTINE W REFLEX MICROSCOPIC
BILIRUBIN URINE: NEGATIVE
GLUCOSE, UA: NEGATIVE mg/dL
HGB URINE DIPSTICK: NEGATIVE
KETONES UR: NEGATIVE mg/dL
Leukocytes, UA: NEGATIVE
Nitrite: NEGATIVE
PROTEIN: 30 mg/dL — AB
Specific Gravity, Urine: >1.03 — ABNORMAL HIGH (ref 1.005–1.030)
pH: 5.5 (ref 5.0–8.0)

## 2015-04-28 LAB — URINE MICROSCOPIC-ADD ON

## 2015-04-28 LAB — BASIC METABOLIC PANEL
Anion gap: 7 (ref 5–15)
BUN: 31 mg/dL — AB (ref 6–20)
CALCIUM: 11 mg/dL — AB (ref 8.9–10.3)
CO2: 30 mmol/L (ref 22–32)
Chloride: 104 mmol/L (ref 101–111)
Creatinine, Ser: 1.92 mg/dL — ABNORMAL HIGH (ref 0.44–1.00)
GFR calc Af Amer: 27 mL/min — ABNORMAL LOW (ref >60)
GFR, EST NON AFRICAN AMERICAN: 24 mL/min — AB (ref >60)
GLUCOSE: 103 mg/dL — AB (ref 65–99)
Potassium: 3.9 mmol/L (ref 3.5–5.1)
SODIUM: 141 mmol/L (ref 135–145)

## 2015-04-28 LAB — TROPONIN I

## 2015-04-28 MED ORDER — SODIUM CHLORIDE 0.9 % IV SOLN
INTRAVENOUS | Status: DC
  Administered 2015-04-28: 13:00:00 via INTRAVENOUS

## 2015-04-28 MED ORDER — SODIUM CHLORIDE 0.9 % IV BOLUS (SEPSIS)
500.0000 mL | Freq: Once | INTRAVENOUS | Status: AC
  Administered 2015-04-28: 500 mL via INTRAVENOUS

## 2015-04-28 NOTE — ED Notes (Signed)
Pt c/o weakness and dizziness that has been going on since July 2016 and has been gradually getting worse over the past 3 weeks. Pt reports trouble focusing and decreased appetite. Pt reports she thinks her "calcium level is up". Pt reports she had the same symptoms back in July 2016 and had to be hospitalized due to her calcium being elevated at a level of 14. Pt alert and oriented x 4.

## 2015-04-28 NOTE — ED Notes (Signed)
Patient with no complaints at this time. Respirations even and unlabored. Skin warm/dry. Discharge instructions reviewed with patient at this time. Patient given opportunity to voice concerns/ask questions. IV removed per policy and band-aid applied to site. Patient discharged at this time and left Emergency Department with steady gait.  

## 2015-04-28 NOTE — ED Provider Notes (Signed)
CSN: [id number]     Arrival date & time 04/28/15  [id number] History   First MD Initiated Contact with Patient 04/28/15 1122     Chief Complaint  Patient presents with  . Fatigue    5+ months  . Weakness  . Anorexia    decreased appetite      HPI Pt was seen at 1230. Per pt, c/o gradual onset and persistence of constant generalized weakness/fatigue for the past 5+ months. Has been associated with decreased appetite. Pt states she has been evaluated by multiple doctor's without definitive dx. Pt states she is "worried about my calcium being up again like in July." Pt states her "calcium was 14" at that time. Pt was evaluated in the ED 2 days ago for "a lump on the side of my neck" and "my calcium was 11.8 then." States she "thinks it's a lot higher now." Denies any specific complaint. Denies CP/palpitations, no SOB/cough, no abd pain, no N/V/D, no back pain, no fevers, no focal motor weakness, no tingling/numbness in extremities, no facial droop, no slurred speech, no ataxia, no visual changes.    Past Medical History  Diagnosis Date  . Type 2 diabetes mellitus ([place])   . Essential hypertension, benign   . GERD (gastroesophageal reflux disease)   . Osteoporosis   . Obesity   . Hypothyroidism   . Dysphagia   . Hepatitis B   . Fatty liver   . Kidney stone August 2014    Patient was seen at [facility]   Past Surgical History  Procedure Laterality Date  . Abdominal hysterectomy    . Deviated septum repair    . Knee arthroscopy      left knee 2003  . Cataract extraction, bilateral     Family History  Problem Relation Age of Onset  . Hypertension Father   . Cancer Maternal Aunt    Social History  Substance Use Topics  . Smoking status: Never Smoker   . Smokeless tobacco: Never Used  . Alcohol Use: No    Review of Systems ROS: Statement: All systems negative except as marked or noted in the HPI; Constitutional: Negative for fever and chills. +generalized weakness/fatigue.; ; Eyes:  Negative for eye pain, redness and discharge. ; ; ENMT: Negative for ear pain, hoarseness, nasal congestion, sinus pressure and sore throat. ; ; Cardiovascular: Negative for chest pain, palpitations, diaphoresis, dyspnea and peripheral edema. ; ; Respiratory: Negative for cough, wheezing and stridor. ; ; Gastrointestinal: Negative for nausea, vomiting, diarrhea, abdominal pain, blood in stool, hematemesis, jaundice and rectal bleeding. . ; ; Genitourinary: +decreased appetite. Negative for dysuria, flank pain and hematuria. ; ; Musculoskeletal: Negative for back pain and neck pain. Negative for swelling and trauma.; ; Skin: Negative for pruritus, rash, abrasions, blisters, bruising and skin lesion.; ; Neuro: Negative for headache, lightheadedness and neck stiffness. Negative for altered level of consciousness , altered mental status, extremity weakness, paresthesias, involuntary movement, seizure and syncope.      Allergies  Zetia; Livalo; and Zocor  Home Medications   Prior to Admission medications   Medication Sig Start Date End Date Taking? Authorizing Provider  acetaminophen (TYLENOL) 500 MG tablet Take 1,000 mg by mouth every 8 (eight) hours as needed for mild pain.   Yes Historical Provider, MD  ALPRAZolam (XANAX) 0.25 MG tablet Take 1 tablet (0.25 mg total) by mouth 2 (two) times daily as needed for anxiety. 03/12/15  Yes [name], MD  aspirin EC 81 MG tablet Take  81 mg by mouth daily.   Yes Historical Provider, MD  atenolol (TENORMIN) 50 MG tablet Take 1 tablet (50 mg total) by mouth daily. 05/31/14  Yes [name], MD  fish oil-omega-3 fatty acids 1000 MG capsule Take 1,000 mg by mouth daily.     Yes Historical Provider, MD  furosemide (LASIX) 20 MG tablet Take 2 tablets (40 mg total) by mouth daily. 03/12/15  Yes [name], MD  levothyroxine (LEVOTHROID) 25 MCG tablet Take 1 tablet (25 mcg total) by mouth daily before breakfast. 03/12/15  Yes [name], MD  levothyroxine  (SYNTHROID, LEVOTHROID) 112 MCG tablet Take 1 tablet (112 mcg total) by mouth daily. 03/12/15  Yes [name], MD  lisinopril (PRINIVIL,ZESTRIL) 20 MG tablet Take 1 tablet (20 mg total) by mouth daily. 03/12/15  Yes [name], MD  meclizine (ANTIVERT) 12.5 MG tablet TAKE  (1)  TABLET  FOUR TIMES DAILY AS NEEDED DIZZINESS 12/02/14  Yes [name], MD  metFORMIN (GLUCOPHAGE) 500 MG tablet Take 1 tablet (500 mg total) by mouth 2 (two) times daily with a meal. 03/12/15  Yes [name], MD  omeprazole (PRILOSEC) 20 MG capsule Take 1 capsule (20 mg total) by mouth daily. Patient taking differently: Take 20 mg by mouth daily as needed (ACID REFLUX).  05/31/14  Yes [name], MD   BP 178/85 mmHg  Pulse 70  Temp(Src) 97.4 F (36.3 C) (Oral)  Resp 16  Ht 5\' 3"  (1.6 m)  Wt 151 lb (68.493 kg)  BMI 26.76 kg/m2  SpO2 97%   12:53:28 Orthostatic Vital Signs MW  Orthostatic Lying  - BP- Lying: 178/83 mmHg ; Pulse- Lying: 65  Orthostatic Sitting - BP- Sitting: 153/99 mmHg ; Pulse- Sitting: 68  Orthostatic Standing at 0 minutes - BP- Standing at 0 minutes: 175/90 mmHg ; Pulse- Standing at 0 minutes: 68      Physical Exam  1235: Physical examination:  Nursing notes reviewed; Vital signs and O2 SAT reviewed;  Constitutional: Well developed, Well nourished, Well hydrated, In no acute distress; Head:  Normocephalic, atraumatic; Eyes: EOMI, PERRL, No scleral icterus; ENMT: TM's clear bilat. Mouth and pharynx normal, Mucous membranes moist; Neck: Supple, Full range of motion, No lymphadenopathy; Cardiovascular: Regular rate and rhythm, No gallop; Respiratory: Breath sounds clear & equal bilaterally, No wheezes.  Speaking full sentences with ease, Normal respiratory effort/excursion; Chest: Nontender, Movement normal; Abdomen: Soft, Nontender, Nondistended, Normal bowel sounds; Genitourinary: No CVA tenderness; Extremities: Pulses normal, No tenderness, No edema, No calf edema or asymmetry.;  Neuro: AA&Ox3, Major CN grossly intact. No facial droop. Speech clear. No gross focal motor or sensory deficits in extremities.; Skin: Color normal, Warm, Dry.; Psych:  Anxious.   ED Course  Procedures (including critical care time) Labs Review   Imaging Review  I have personally reviewed and evaluated these images and lab results as part of my medical decision-making.   EKG Interpretation   Date/Time:  Monday April 28 2015 12:42:11 EST Ventricular Rate:  64 PR Interval:  202 QRS Duration: 85 QT Interval:  397 QTC Calculation: 410 R Axis:   55 Text Interpretation:  Sinus rhythm When compared with ECG of 04/26/2015 No  significant change was found Confirmed by [facility]  MD, Nunzio Cory [phone]) on  04/28/2015 12:52:50 PM      MDM  MDM Reviewed: previous chart, nursing note and vitals Reviewed previous: labs, CT scan and ECG Interpretation: labs, ECG, x-ray and CT scan  Results for orders placed or performed during the hospital encounter of 04/28/15  CBC with Differential/Platelet  Result Value Ref Range   WBC 8.5 4.0 - 10.5 K/uL   RBC 4.24 3.87 - 5.11 MIL/uL   Hemoglobin 13.1 12.0 - 15.0 g/dL   HCT 38.9 36.0 - 46.0 %   MCV 91.7 78.0 - 100.0 fL   MCH 30.9 26.0 - 34.0 pg   MCHC 33.7 30.0 - 36.0 g/dL   RDW 12.5 11.5 - 15.5 %   Platelets 269 150 - 400 K/uL   Neutrophils Relative % 60 %   Neutro Abs 5.1 1.7 - 7.7 K/uL   Lymphocytes Relative 29 %   Lymphs Abs 2.5 0.7 - 4.0 K/uL   Monocytes Relative 10 %   Monocytes Absolute 0.8 0.1 - 1.0 K/uL   Eosinophils Relative 1 %   Eosinophils Absolute 0.1 0.0 - 0.7 K/uL   Basophils Relative 0 %   Basophils Absolute 0.0 0.0 - 0.1 K/uL  Basic metabolic panel  Result Value Ref Range   Sodium 141 135 - 145 mmol/L   Potassium 3.9 3.5 - 5.1 mmol/L   Chloride 104 101 - 111 mmol/L   CO2 30 22 - 32 mmol/L   Glucose, Bld 103 (H) 65 - 99 mg/dL   BUN 31 (H) 6 - 20 mg/dL   Creatinine, Ser 1.92 (H) 0.44 - 1.00 mg/dL   Calcium 11.0  (H) 8.9 - 10.3 mg/dL   GFR calc non Af Amer 24 (L) >60 mL/min   GFR calc Af Amer 27 (L) >60 mL/min   Anion gap 7 5 - 15  Urinalysis, Routine w reflex microscopic  Result Value Ref Range   Color, Urine YELLOW YELLOW   APPearance CLEAR CLEAR   Specific Gravity, Urine >1.030 (H) 1.005 - 1.030   pH 5.5 5.0 - 8.0   Glucose, UA NEGATIVE NEGATIVE mg/dL   Hgb urine dipstick NEGATIVE NEGATIVE   Bilirubin Urine NEGATIVE NEGATIVE   Ketones, ur NEGATIVE NEGATIVE mg/dL   Protein, ur 30 (A) NEGATIVE mg/dL   Nitrite NEGATIVE NEGATIVE   Leukocytes, UA NEGATIVE NEGATIVE  Hepatic function panel  Result Value Ref Range   Total Protein 7.2 6.5 - 8.1 g/dL   Albumin 3.7 3.5 - 5.0 g/dL   AST 19 15 - 41 U/L   ALT 11 (L) 14 - 54 U/L   Alkaline Phosphatase 59 38 - 126 U/L   Total Bilirubin 0.4 0.3 - 1.2 mg/dL   Bilirubin, Direct <0.1 (L) 0.1 - 0.5 mg/dL   Indirect Bilirubin NOT CALCULATED 0.3 - 0.9 mg/dL  Troponin I  Result Value Ref Range   Troponin I <0.03 <0.031 ng/mL  Urine microscopic-add on  Result Value Ref Range   Squamous Epithelial / LPF 0-5 (A) NONE SEEN   WBC, UA 0-5 0 - 5 WBC/hpf   RBC / HPF 0-5 0 - 5 RBC/hpf   Bacteria, UA FEW (A) NONE SEEN   Casts HYALINE CASTS (A) NEGATIVE   Dg Chest 2 View 04/28/2015  CLINICAL DATA:  Weakness, confusion since July 2016. EXAM: CHEST  2 VIEW COMPARISON:  11/26/2014 FINDINGS: Persistent density in the right middle lobe is stable and likely reflect scarring or chronic atelectasis. Left lung is clear. Heart is normal size. No effusions. No acute bony abnormality. IMPRESSION: Right middle lobe scarring versus chronic atelectasis. No acute findings. Electronically Signed   By: Rolm Baptise M.D.   On: 04/28/2015 13:33   Ct Head Wo Contrast 04/28/2015  CLINICAL DATA:  81 year old female with weakness and confusion since July. Altered mental status. Initial encounter. Personal history of lymphoma. EXAM: CT HEAD WITHOUT CONTRAST TECHNIQUE: Contiguous axial images  were obtained from the base of the skull through the vertex without intravenous contrast. COMPARISON:  Neck CT without contrast 04/26/2015 FINDINGS: Visualized paranasal sinuses and mastoids are clear. No acute osseous abnormality identified. No acute orbit or scalp soft tissue findings. Calcified atherosclerosis at the skull base. Patchy and confluent bilateral cerebral white matter hypodensity. Chronic appearing lacunar infarcts of the bilateral basal ganglia, more numerous on the right. No acute intracranial hemorrhage identified. No cortically based acute infarct identified. Sulcus variation versus arachnoid cyst along the anterior superior left frontal lobe. No intracranial mass effect or other space-occupying lesion. No suspicious intracranial vascular hyperdensity. IMPRESSION: Chronic small vessel ischemia.  No acute intracranial abnormality. Electronically Signed   By: Genevie Ann M.D.   On: 04/28/2015 13:29    1615:  Pt is not orthostatic on VS. Calcium is improved from previous; pt reassured. No clear indication for admission at this time. Pt states she is ready to go home now. Pt encouraged to f/u with her PMD for good continuity of care and control of her chronic symptoms.  Pt verb understanding. Dx and testing d/w pt and family.  Questions answered.  Verb understanding, agreeable to d/c home with outpt f/u.      Francine Graven, DO 04/30/15 2155

## 2015-04-28 NOTE — Discharge Instructions (Signed)
°Emergency Department Resource Guide °1) Find a Doctor and Pay Out of Pocket °Although you won't have to find out who is covered by your insurance plan, it is a good idea to ask around and get recommendations. You will then need to call the office and see if the doctor you have chosen will accept you as a new patient and what types of options they offer for patients who are self-pay. Some doctors offer discounts or will set up payment plans for their patients who do not have insurance, but you will need to ask so you aren't surprised when you get to your appointment. ° °2) Contact Your Local Health Department °Not all health departments have doctors that can see patients for sick visits, but many do, so it is worth a call to see if yours does. If you don't know where your local health department is, you can check in your phone book. The CDC also has a tool to help you locate your state's health department, and many state websites also have listings of all of their local health departments. ° °3) Find a Walk-in Clinic °If your illness is not likely to be very severe or complicated, you may want to try a walk in clinic. These are popping up all over the country in pharmacies, drugstores, and shopping centers. They're usually staffed by nurse practitioners or physician assistants that have been trained to treat common illnesses and complaints. They're usually fairly quick and inexpensive. However, if you have serious medical issues or chronic medical problems, these are probably not your best option. ° °No Primary Care Doctor: °- Call [facility] at  832-8000 - they can help you locate a primary care doctor that  accepts your insurance, provides certain services, etc. °- Physician Referral Service- 1-800-533-3463 ° °Chronic Pain Problems: °Organization         Address  Phone   Notes  °[facility]  (336) 297-2271 Patients need to be referred by their primary care doctor.  ° °Medication  Assistance: °Organization         Address  Phone   Notes  °[place] [facility] [address] °[place], [place] [address] (336) 641-8030 --Must be a resident of [place] °-- Must have NO insurance coverage whatsoever (no Medicaid/ Medicare, etc.) °-- The pt. MUST have a primary care doctor that directs their care regularly and follows them in the community °  °[facility]  (866) 331-1348   °[employer]  (888) 892-1162   ° °Agencies that provide inexpensive medical care: °Organization         Address  Phone   Notes  °[facility]  (336) 832-8035   ° Internal Medicine    (336) 832-7272   °[facility] [address] °[place], [place] [address] (336) 832-4777   °[facility] [address], °[place] (336) 271-4999   °[facility]    (336) 373-0678   °[facility]    (336) 272-1050   °[facility] ° [address], [place] Phone:  (336) 832-4444, Fax:  (336) 832-4440 Hours of Operation:  9 am - 6 pm, M-F.  Also accepts Medicaid/Medicare and self-pay.  °[facility] ° [address], [place] Phone: (336) 832-3150, Fax: (336) 832-3151. Hours of Operation:  8:30 am - 5:30 pm, M-F.  Also accepts Medicaid and self-pay.  °[facility] [place] [address], [place] Phone: (336) 878-6027   °[facility] [address], [place], [place] (336)723-1848, Ext. 123 Mondays & Thursdays: 7-9 AM.  First 15 patients are seen on a first come, first serve basis. °  ° °Medicaid-accepting [place] Providers: ° °Organization         Address  Phone   Notes  °[facility] [address], [place] (336) 641-2100 Also accepts self-pay patients.  °[facility] [address], [place] ° (336) 856-9996   °[facility] [address], [place]  (336) 288-8857   °[facility] [address] [place] [address], [place] (336) 299-7000   °Veita Bland [address], [place]  ° (336) 373-1557 Only accepts [place] Access Medicaid patients after they have their name applied to their card.  ° °Self-Pay (no insurance) in [place]: ° °Organization         Address  Phone   Notes  °Sickle Cell Patients, [facility] [address], [place] (336) 832-1970   °[facility] [address], [place] (336) 832-4400   °[facility] [place] ° [address], [place] (336) 992-4800   °[facility]/Dr. Osei-Bonsu ° [address] [place] [address], [place] or [address], [place] (336) 841-8500 Phone number for both [place] and [place] locations is the same.  °[facility] [address], [place] (336) 299-0000   °[facility] [address] [place] [address], [place] or [address] (336) 852-7530 °(336) 878-2260   °[facility] [address], [place] (336) 350-1642, phone; (336) 294-5005, fax Sees patients 1st and 3rd Saturday of every month.  Must not qualify for public or private insurance (i.e. Medicaid, Medicare, [place] Health Choice, Veterans' Benefits) • Household income should be no more than 200% of the poverty level •The clinic cannot treat you if you are pregnant or think you are pregnant • Sexually transmitted diseases are not treated at the clinic.  ° ° °Dental Care: °Organization         Address  Phone  Notes  °[place] [facility] [address], [place] (336) 641-6152 Accepts children up to age 21 who are enrolled in Medicaid or [place] Health Choice; pregnant women with a Medicaid card; and children who have applied for Medicaid or [place] Health Choice, but were declined, whose parents can pay a reduced fee at time of service.  °[place]  [facility] [place]  [address], [place] (336) 641-7733 Accepts children up to age 21 who are enrolled in Medicaid or [place] Health Choice; pregnant women with a Medicaid card; and children who have applied for Medicaid or [place] Health Choice, but were declined, whose parents can pay a reduced fee at time of service.  °[facility] ° [address], [place] (336) 641-4533 Patients are seen by appointment only. Walk-ins are not accepted. [facility] will see patients 18 years of age and older. °Monday - Tuesday (8am-5pm) °Most Wednesdays (8:30-5pm) °$30 per visit, cash only  °[facility] ° [address], [place] (336) 641-4533 Patients are seen by appointment only. Walk-ins are not accepted. [facility] will see patients 18 years of age and older. °One   Wednesday Evening (Monthly: Volunteer Based).  $30 per visit, cash only  °[facility]  (919) 537-3737 for adults; Children under age 4, call Graduate Pediatric Dentistry at (919) [phone]. Children aged 4-14, please call (919) [phone] to request a pediatric application. ° Dental services are provided in all areas of dental care including fillings, crowns and bridges, complete and partial dentures, implants, gum treatment, root canals, and extractions. Preventive care is also provided. Treatment is provided to both adults and children. °Patients are selected via a lottery and there is often a waiting list. °  °[facility] [address], °[place] ° (336) 763-8833 [email or web address] °  °[facility] [address], [place], [place] (336)723-1848, Ext. 123 Second and Fourth Thursday of each month, opens at 6:30 AM; Clinic ends at 9 AM.  Patients are seen on a first-come first-served basis, and a limited number are seen during each clinic.  ° °[facility] ° [address], [place], [place] (336) 723-7904    Eligibility Requirements °You must have lived in [place], or [place] counties for at least the last three months. °  You cannot be eligible for state or federal sponsored healthcare insurance, including Veterans Administration, Medicaid, or Medicare. °  You generally cannot be eligible for healthcare insurance through your employer.  °  How to apply: °Eligibility screenings are held every Tuesday and Wednesday afternoon from 1:00 pm until 4:00 pm. You do not need an appointment for the interview!  °[facility] [address], [place], [place] 336-631-2330   °[facility] [facility]  336-342-8273   °[place]  336-703-3100   °[facility]  336-570-6415   ° °Behavioral Health Resources in the Community: °Intensive Outpatient Programs °Organization         Address  Phone  Notes  °[facility] [address], [place], [place] 336-878-6098   °[facility] [address], [place], [place] 336-832-9800   °[facility] [address], [place], [place] ° 336-882-2125   °[facility] [address],  °[place], [place] 1-800-853-5163 or 336-641-4981   °Substance Abuse Resources °Organization         Address  Phone  Notes  °[facility]  336-882-2125   °[facility]  336-784-9470   °[facility]  336-285-9073   °[facility]  336-845-3988   °Residential & Outpatient Substance Abuse Program  1-800-659-3381   °Psychological Services °Organization         Address  Phone  Notes  °[facility]  336- 832-9600   °[facility]  336- 378-7881   °[facility] [address], [place] 1-800-853-5163 or 336-641-4981   ° °Mobile Crisis Teams °Organization         Address  Phone  Notes  °[facility], Mobile Crisis Care Unit  1-877-626-1772   °Assertive °[facility] ° [address]  [place], [place] 336-834-9664   °Sharon DeEsch [address] °[place] 336-554-5454   ° °Self-Help/Support Groups °Organization         Address  Phone             Notes  °[facility] - variety of support groups  336- 373-1402 Call for more information  °Narcotics Anonymous (NA), [facility] [address], °[place]  2 meetings at this location  ° °  Residential Treatment Programs °Organization         Address  Phone  Notes  °[facility] [address],    °[place]  1-866-801-8205   °[facility] ° [address], [place], [place] 704-293-8524   °[facility] [address], [place] 336-845-3988 Admissions: 8am-3pm M-F  °[facility] [address]    °[place], [place] 336-841-1104   °[facility] [address], [place], [place] 336-379-7146   °[facility] [address]  °[place], [place] 336-285-9073   °[facility] - Intensive Outpatient [address], [place], [place] 336-852-3033   °[facility] [address]  °[place], [place] 1-877-615-2722 or 336-784-9470   °[facility] [address] [place], [place] 336-227-7417 Accepts Medicaid  °[facility] [address]  °[place] 1-800-659-3381 Substance Abuse/Addiction Treatment  ° °[place] Behavioral Health Resources °Organization         Address  Phone  Notes  °[facility]  (888) 581-9988   °Julie Brannon, PhD [address] [place], [place]   (336) 349-5553 or (336) 951-0000   °[facility]   [address] °[place], [place] (336) 349-4454   °[facility] [address], [place], [place] (336) 342-8316 Insurance/Medicaid/sponsorship through [facility]  °[facility] [address]                                    [place], [place] (336) 342-8316 Therapy/tele-psych/case    °[facility] [address]  ° [place], [place] (336) 349-2233    °Dr. Arfeen  (336) 349-4544   °[facility] [place]  [employer] [place] [facility] 1) [address], [place] °2) [address], [place] °3)  [address], [place] (336) 349-3220 °(336) 342-7768 ° °(336) 342-8140   °[place] [facility] (336) 342-1394 or (336) 342-3537 (After Hours)    ° ° ° °Take your usual prescriptions as previously directed.  Call your regular medical doctor tomorrow to schedule a follow up appointment within the next 2 days. Return to the Emergency Department immediately sooner if worsening.  ° °

## 2015-04-28 NOTE — ED Notes (Signed)
Having weakness and confusion since July 5th, 2016.  Was seen in [place] on Saturday for lump to right side of neck.  On Saturday and pt says Calcium was 11.8.

## 2015-04-29 ENCOUNTER — Telehealth: Payer: Self-pay | Admitting: Family Medicine

## 2015-04-29 LAB — URINE CULTURE

## 2015-04-29 NOTE — Telephone Encounter (Signed)
Pt given appt tomorrow at 10:00 with Dr. Laurance Flatten.

## 2015-04-30 ENCOUNTER — Telehealth: Payer: Self-pay | Admitting: Family Medicine

## 2015-04-30 ENCOUNTER — Encounter: Payer: Self-pay | Admitting: Family Medicine

## 2015-04-30 ENCOUNTER — Ambulatory Visit (INDEPENDENT_AMBULATORY_CARE_PROVIDER_SITE_OTHER): Payer: Medicare Other | Admitting: Family Medicine

## 2015-04-30 VITALS — BP 177/89 | HR 60 | Temp 97.0°F | Ht 63.0 in | Wt 154.0 lb

## 2015-04-30 DIAGNOSIS — R748 Abnormal levels of other serum enzymes: Secondary | ICD-10-CM

## 2015-04-30 DIAGNOSIS — I10 Essential (primary) hypertension: Secondary | ICD-10-CM | POA: Diagnosis not present

## 2015-04-30 DIAGNOSIS — Z23 Encounter for immunization: Secondary | ICD-10-CM

## 2015-04-30 DIAGNOSIS — R531 Weakness: Secondary | ICD-10-CM

## 2015-04-30 DIAGNOSIS — R7989 Other specified abnormal findings of blood chemistry: Secondary | ICD-10-CM

## 2015-04-30 MED ORDER — AMLODIPINE BESYLATE 5 MG PO TABS: ORAL_TABLET | ORAL | Status: DC

## 2015-04-30 NOTE — Telephone Encounter (Signed)
error 

## 2015-04-30 NOTE — Progress Notes (Signed)
Subjective:    Patient ID: Lisa Sutton, female    DOB: 1933-09-09, 81 y.o.   MRN: [id number]  HPI Patient here today for hospital follow up from [facility] and [facility]. Her recent hospital diagnosis was elevated BP, Tortuosity of Artery, and fatigue. The recent visits to the emergency room were reviewed with the patient. An ultrasound of the kidneys was reviewed and she has a simple renal cyst in the right kidney. A CT of the neck basically showed ectasia and tortuosity of the innominate artery and the proximal right common carotid artery. There was no focal aneurysm or mass lesion present. She has some spondylosis in her cervical spine and an atrophic thyroid. The CT of the head showed chronic small vessel ischemia but no acute intracranial abnormality. The patient denies any chest pain or shortness of breath. She is just weak and dizzy and this feels exactly the way it felt when she had the problem with her elevated calcium back in the summer. Her bowels are moving okay and she is passing her water without problems. Her blood pressure is elevated today and we tried to speak with her nephrologist and could not get him on the phone and so we will go ahead and add to her current treatment regimen amlodipine 5 mg one half daily and he can adjust this or replace this when he sees her next week.      Patient Active Problem List   Diagnosis Date Noted  . Abnormal chest x-ray 11/27/2014  . Protein-calorie malnutrition, severe ([place]) 11/27/2014  . AKI (acute kidney injury) ([place]) 11/26/2014  . Hypercalcemia 11/26/2014  . Leukocytosis 11/26/2014  . Weight loss 11/26/2014  . Weakness 11/26/2014  . Insomnia 04/12/2013  . Hyperlipidemia 04/12/2013  . Essential hypertension, benign 10/13/2012  . GERD (gastroesophageal reflux disease) 04/25/2012  . Fatty liver 08/09/2011  . Hepatitis B 08/09/2011  . Dysphagia 08/09/2011  . Type 2 diabetes mellitus ([place]) 08/09/2011  . Hypothyroid 08/09/2011    Outpatient Encounter Prescriptions as of 04/30/2015  Medication Sig  . acetaminophen (TYLENOL) 500 MG tablet Take 1,000 mg by mouth every 8 (eight) hours as needed for mild pain.  Marland Kitchen ALPRAZolam (XANAX) 0.25 MG tablet Take 1 tablet (0.25 mg total) by mouth 2 (two) times daily as needed for anxiety.  Mar[place] Kitchen aspirin EC 81 MG tablet Take 81 mg by mouth daily.  Marland Kitchen atenolol (TENORMIN) 50 MG tablet Take 1 tablet (50 mg total) by mouth daily.  . fish oil-omega-3 fatty acids 1000 MG capsule Take 1,000 mg by mouth daily.    . furosemide (LASIX) 20 MG tablet Take 2 tablets (40 mg total) by mouth daily.  Marland Kitchen levothyroxine (LEVOTHROID) 25 MCG tablet Take 1 tablet (25 mcg total) by mouth daily before breakfast.  . levothyroxine (SYNTHROID, LEVOTHROID) 112 MCG tablet Take 1 tablet (112 mcg total) by mouth daily.  Marland Kitchen lisinopril (PRINIVIL,ZESTRIL) 20 MG tablet Take 1 tablet (20 mg total) by mouth daily.  . meclizine (ANTIVERT) 12.5 MG tablet TAKE  (1)  TABLET  FOUR TIMES DAILY AS NEEDED DIZZINESS  . metFORMIN (GLUCOPHAGE) 500 MG tablet Take 1 tablet (500 mg total) by mouth 2 (two) times daily with a meal.  . omeprazole (PRILOSEC) 20 MG capsule Take 1 capsule (20 mg total) by mouth daily. (Patient taking differently: Take 20 mg by mouth daily as needed (ACID REFLUX). )   No facility-administered encounter medications on file as of 04/30/2015.      Review of Systems  Constitutional:  Positive for appetite change (decreased) and fatigue.  HENT: Negative.   Eyes: Negative.   Respiratory: Negative.   Cardiovascular: Negative.   Gastrointestinal: Negative.   Endocrine: Negative.   Genitourinary: Negative.   Skin: Negative.   Allergic/Immunologic: Negative.   Neurological: Negative.   Hematological: Negative.   Psychiatric/Behavioral: Negative.        Objective:   Physical Exam  Constitutional: She is oriented to person, place, and time. She appears well-developed and well-nourished. No distress.  HENT:   Head: Normocephalic and atraumatic.  Right Ear: External ear normal.  Left Ear: External ear normal.  Nose: Nose normal.  Mouth/Throat: Oropharynx is clear and moist. No oropharyngeal exudate.  Eyes: Conjunctivae and EOM are normal. Pupils are equal, round, and reactive to light. Right eye exhibits no discharge. Left eye exhibits no discharge. No scleral icterus.  Neck: Normal range of motion. Neck supple. No thyromegaly present.  The carotid artery appears somewhat dilated on the right side as it is pulsating but the recent scan of her neck showed this to be a tortuous carotid artery.  Cardiovascular: Normal rate, regular rhythm, normal heart sounds and intact distal pulses.   No murmur heard. The rhythm was regular today at about 60/m.  Pulmonary/Chest: Effort normal and breath sounds normal. No respiratory distress. She has no wheezes. She has no rales. She exhibits no tenderness.  Abdominal: Soft. Bowel sounds are normal. She exhibits no mass. There is no tenderness. There is no rebound and no guarding.  No masses. No organ enlargement.  Musculoskeletal: Normal range of motion. She exhibits no edema.  Lymphadenopathy:    She has no cervical adenopathy.  Neurological: She is alert and oriented to person, place, and time. She has normal reflexes. No cranial nerve deficit.  Skin: Skin is warm and dry. No rash noted.  Psychiatric: She has a normal mood and affect. Her behavior is normal. Judgment and thought content normal.  Nursing note and vitals reviewed.   BP 177/89 mmHg  Pulse 60  Temp(Src) 97 F (36.1 C) (Oral)  Ht 5\' 3"  (1.6 m)  Wt 154 lb (69.854 kg)  BMI 27.29 kg/m2       Assessment & Plan:  1. Encounter for immunization -Patient will be given a flu shot today.  2. Essential hypertension, benign -Patient will follow-up with the nephrologist and we will make a determination about any and all medications prior to that visit to expedite better blood pressure  control  3. Hypercalcemia -Follow-up with nephrologist as planned  4. Weakness -The reason for this must be related to her elevated serum calcium that we have not really found a reason for this at this time. Several months ago her HCTZ was changed to furosemide but the serum calcium continues to remain elevated. She has had a thorough workup of her neck and there have been no problems found regarding the parathyroid. She is seeing the endocrinologist. The weakness that she is currently experiences seems to be similar to what she had when she was admitted to the hospital back in the summer with a serum calcium up to 14.  5. Elevated serum creatinine -The patient has a history of hypertension and the serum creatinine remains elevated but stable.  Patient Instructions  Please follow-up with nephrologist and gastroenterologist and endocrinologist as planned Continue to monitor blood pressures at home and watch sodium intake   Arrie Senate MD

## 2015-04-30 NOTE — Patient Instructions (Signed)
Please follow-up with nephrologist and gastroenterologist and endocrinologist as planned Continue to monitor blood pressures at home and watch sodium intake

## 2015-05-06 DIAGNOSIS — E1129 Type 2 diabetes mellitus with other diabetic kidney complication: Secondary | ICD-10-CM | POA: Diagnosis not present

## 2015-05-06 DIAGNOSIS — N189 Chronic kidney disease, unspecified: Secondary | ICD-10-CM | POA: Diagnosis not present

## 2015-05-13 ENCOUNTER — Other Ambulatory Visit (HOSPITAL_COMMUNITY): Payer: Self-pay | Admitting: Nephrology

## 2015-05-13 DIAGNOSIS — I701 Atherosclerosis of renal artery: Secondary | ICD-10-CM

## 2015-05-14 ENCOUNTER — Ambulatory Visit (HOSPITAL_COMMUNITY)
Admission: RE | Admit: 2015-05-14 | Discharge: 2015-05-14 | Disposition: A | Discharge status: Home / Self Care | Payer: Medicare Other | Source: Ambulatory Visit | Attending: Vascular Surgery | Admitting: Vascular Surgery

## 2015-05-14 DIAGNOSIS — I701 Atherosclerosis of renal artery: Secondary | ICD-10-CM | POA: Insufficient documentation

## 2015-05-21 ENCOUNTER — Other Ambulatory Visit: Payer: Self-pay | Admitting: *Deleted

## 2015-05-21 ENCOUNTER — Encounter: Payer: Self-pay | Admitting: Family Medicine

## 2015-05-21 DIAGNOSIS — R7989 Other specified abnormal findings of blood chemistry: Secondary | ICD-10-CM

## 2015-05-21 DIAGNOSIS — E785 Hyperlipidemia, unspecified: Secondary | ICD-10-CM

## 2015-05-21 DIAGNOSIS — K219 Gastro-esophageal reflux disease without esophagitis: Secondary | ICD-10-CM

## 2015-05-21 DIAGNOSIS — I10 Essential (primary) hypertension: Secondary | ICD-10-CM

## 2015-05-21 DIAGNOSIS — E119 Type 2 diabetes mellitus without complications: Secondary | ICD-10-CM

## 2015-05-21 DIAGNOSIS — E559 Vitamin D deficiency, unspecified: Secondary | ICD-10-CM

## 2015-05-21 DIAGNOSIS — E039 Hypothyroidism, unspecified: Secondary | ICD-10-CM

## 2015-05-22 ENCOUNTER — Other Ambulatory Visit: Payer: Self-pay

## 2015-05-22 MED ORDER — ALPRAZOLAM 0.25 MG PO TABS: 0.2500 mg | ORAL_TABLET | Freq: Two times a day (BID) | ORAL | Status: DC | PRN

## 2015-05-22 NOTE — Telephone Encounter (Signed)
Last seen 04/30/15  DW[facility]  If approved print for mail order

## 2015-05-22 NOTE — Telephone Encounter (Signed)
Patient aware rx is ready to be picked up 

## 2015-05-22 NOTE — Telephone Encounter (Signed)
This is okay to refill 

## 2015-05-27 ENCOUNTER — Other Ambulatory Visit (INDEPENDENT_AMBULATORY_CARE_PROVIDER_SITE_OTHER): Payer: Medicare Other

## 2015-05-27 DIAGNOSIS — E785 Hyperlipidemia, unspecified: Secondary | ICD-10-CM

## 2015-05-27 DIAGNOSIS — E039 Hypothyroidism, unspecified: Secondary | ICD-10-CM | POA: Diagnosis not present

## 2015-05-27 DIAGNOSIS — I10 Essential (primary) hypertension: Secondary | ICD-10-CM | POA: Diagnosis not present

## 2015-05-27 DIAGNOSIS — R7989 Other specified abnormal findings of blood chemistry: Secondary | ICD-10-CM

## 2015-05-27 DIAGNOSIS — K219 Gastro-esophageal reflux disease without esophagitis: Secondary | ICD-10-CM

## 2015-05-27 DIAGNOSIS — E559 Vitamin D deficiency, unspecified: Secondary | ICD-10-CM

## 2015-05-27 DIAGNOSIS — E119 Type 2 diabetes mellitus without complications: Secondary | ICD-10-CM

## 2015-05-27 DIAGNOSIS — R748 Abnormal levels of other serum enzymes: Secondary | ICD-10-CM | POA: Diagnosis not present

## 2015-05-27 LAB — POCT GLYCOSYLATED HEMOGLOBIN (HGB A1C): Hemoglobin A1C: 5.9

## 2015-05-29 ENCOUNTER — Encounter: Payer: Self-pay | Admitting: Family Medicine

## 2015-05-29 ENCOUNTER — Ambulatory Visit (INDEPENDENT_AMBULATORY_CARE_PROVIDER_SITE_OTHER): Payer: Medicare Other | Admitting: Family Medicine

## 2015-05-29 ENCOUNTER — Ambulatory Visit: Payer: Medicare Other | Admitting: Family Medicine

## 2015-05-29 DIAGNOSIS — I10 Essential (primary) hypertension: Secondary | ICD-10-CM

## 2015-05-29 DIAGNOSIS — R5383 Other fatigue: Secondary | ICD-10-CM

## 2015-05-29 DIAGNOSIS — R634 Abnormal weight loss: Secondary | ICD-10-CM | POA: Diagnosis not present

## 2015-05-29 LAB — NMR, LIPOPROFILE
Cholesterol: 297 mg/dL — ABNORMAL HIGH (ref 100–199)
HDL Cholesterol by NMR: 49 mg/dL (ref >39)
HDL PARTICLE NUMBER: 30.2 umol/L — AB (ref >=30.5)
LDL Particle Number: 2375 nmol/L — ABNORMAL HIGH (ref <1000)
LDL SIZE: 20.3 nm (ref >20.5)
LDL-C: 194 mg/dL — ABNORMAL HIGH (ref 0–99)
LP-IR SCORE: 87 — AB (ref <=45)
SMALL LDL PARTICLE NUMBER: 1221 nmol/L — AB (ref <=527)
Triglycerides by NMR: 270 mg/dL — ABNORMAL HIGH (ref 0–149)

## 2015-05-29 LAB — CBC WITH DIFFERENTIAL/PLATELET
BASOS: 1 %
Basophils Absolute: 0.1 10*3/uL (ref 0.0–0.2)
EOS (ABSOLUTE): 0.2 10*3/uL (ref 0.0–0.4)
Eos: 4 %
Hematocrit: 40.8 % (ref 34.0–46.6)
Hemoglobin: 14 g/dL (ref 11.1–15.9)
IMMATURE GRANULOCYTES: 2 %
Immature Grans (Abs): 0.1 10*3/uL (ref 0.0–0.1)
Lymphocytes Absolute: 2.5 10*3/uL (ref 0.7–3.1)
Lymphs: 45 %
MCH: 30.4 pg (ref 26.6–33.0)
MCHC: 34.3 g/dL (ref 31.5–35.7)
MCV: 89 fL (ref 79–97)
MONOS ABS: 0.8 10*3/uL (ref 0.1–0.9)
Monocytes: 14 %
NEUTROS PCT: 34 %
Neutrophils Absolute: 1.9 10*3/uL (ref 1.4–7.0)
PLATELETS: 292 10*3/uL (ref 150–379)
RBC: 4.6 x10E6/uL (ref 3.77–5.28)
RDW: 13.4 % (ref 12.3–15.4)
WBC: 5.5 10*3/uL (ref 3.4–10.8)

## 2015-05-29 LAB — BMP8+EGFR

## 2015-05-29 LAB — CALCIUM, IONIZED: Calcium, Ion: 5 mg/dL (ref 4.5–5.6)

## 2015-05-29 LAB — HEPATIC FUNCTION PANEL

## 2015-05-29 LAB — VITAMIN D 25 HYDROXY (VIT D DEFICIENCY, FRACTURES): VIT D 25 HYDROXY: 63.1 ng/mL (ref 30.0–100.0)

## 2015-05-29 LAB — THYROID PANEL WITH TSH: TSH: 0.208 u[IU]/mL — AB (ref 0.450–4.500)

## 2015-05-29 NOTE — Patient Instructions (Signed)
If the patient is going to have outpatient surgery where she is not put under general anesthesia and it is okay with the nephrologist it would probably be fine as long as her lab work is stable prior to the scheduled surgery The patient should continue with her current medication for her blood pressure She should follow through with her appointment with the nephrologist If she develops increasing fatigue or elevated blood pressure she should get back in touch with [place] She should continue to monitor blood pressures at home and watch her sodium intake

## 2015-05-29 NOTE — Progress Notes (Signed)
Subjective:    Patient ID: Lisa Sutton, female    DOB: 08/03/1933, 82 y.o.   MRN: [id number]  HPI Patient here today for 4 week follow up on her BP and calcium levels. The patient is feeling much better today. Her blood pressures at home have improved. She has less fatigue. Her weight has been stable. Since seeing [place] about 4 weeks ago she has had more studies from the nephrologist including a more detailed ultrasound of the kidneys a 24-hour urine and additional lab work and according to the patient all of this was normal. She brings in blood pressures for review today and they will be scanned into the record and they are also improved.      Patient Active Problem List   Diagnosis Date Noted  . Abnormal chest x-ray 11/27/2014  . Protein-calorie malnutrition, severe ([place]) 11/27/2014  . AKI (acute kidney injury) ([place]) 11/26/2014  . Hypercalcemia 11/26/2014  . Leukocytosis 11/26/2014  . Weight loss 11/26/2014  . Weakness 11/26/2014  . Insomnia 04/12/2013  . Hyperlipidemia 04/12/2013  . Essential hypertension, benign 10/13/2012  . GERD (gastroesophageal reflux disease) 04/25/2012  . Fatty liver 08/09/2011  . Hepatitis B 08/09/2011  . Dysphagia 08/09/2011  . Type 2 diabetes mellitus ([place]) 08/09/2011  . Hypothyroid 08/09/2011   Outpatient Encounter Prescriptions as of 05/29/2015  Medication Sig  . acetaminophen (TYLENOL) 500 MG tablet Take 1,000 mg by mouth every 8 (eight) hours as needed for mild pain.  Marland Kitchen ALPRAZolam (XANAX) 0.25 MG tablet Take 1 tablet (0.25 mg total) by mouth 2 (two) times daily as needed for anxiety.  Marland Kitchen amLODipine (NORVASC) 5 MG tablet Take 1/2 tab daily as directed  . aspirin EC 81 MG tablet Take 81 mg by mouth daily.  Marland Kitchen atenolol (TENORMIN) 50 MG tablet Take 1 tablet (50 mg total) by mouth daily.  . fish oil-omega-3 fatty acids 1000 MG capsule Take 1,000 mg by mouth daily.    . furosemide (LASIX) 20 MG tablet Take 2 tablets (40 mg total) by mouth daily.  Marland Kitchen  levothyroxine (LEVOTHROID) 25 MCG tablet Take 1 tablet (25 mcg total) by mouth daily before breakfast.  . levothyroxine (SYNTHROID, LEVOTHROID) 112 MCG tablet Take 1 tablet (112 mcg total) by mouth daily.  Marland Kitchen lisinopril (PRINIVIL,ZESTRIL) 20 MG tablet Take 1 tablet (20 mg total) by mouth daily.  . meclizine (ANTIVERT) 12.5 MG tablet TAKE  (1)  TABLET  FOUR TIMES DAILY AS NEEDED DIZZINESS  . metFORMIN (GLUCOPHAGE) 500 MG tablet Take 1 tablet (500 mg total) by mouth 2 (two) times daily with a meal.  . omeprazole (PRILOSEC) 20 MG capsule Take 1 capsule (20 mg total) by mouth daily. (Patient taking differently: Take 20 mg by mouth daily as needed (ACID REFLUX). )   No facility-administered encounter medications on file as of 05/29/2015.      Review of Systems  Constitutional: Negative.   HENT: Negative.   Eyes: Negative.   Respiratory: Negative.   Cardiovascular: Negative.   Gastrointestinal: Negative.   Endocrine: Negative.   Genitourinary: Negative.   Musculoskeletal: Negative.   Skin: Negative.   Allergic/Immunologic: Negative.   Neurological: Negative.   Hematological: Negative.   Psychiatric/Behavioral: Negative.        Objective:   Physical Exam  Constitutional: She is oriented to person, place, and time. She appears well-developed and well-nourished. No distress.  HENT:  Head: Normocephalic and atraumatic.  Eyes: Conjunctivae and EOM are normal. Pupils are equal, round, and reactive to light. Right  eye exhibits no discharge. Left eye exhibits no discharge. No scleral icterus.  Neck: Normal range of motion. Neck supple. No thyromegaly present.  No masses or thyromegaly or adenopathy  Cardiovascular: Normal rate, regular rhythm and normal heart sounds.   No murmur heard. Pulmonary/Chest: Effort normal and breath sounds normal. No respiratory distress. She has no wheezes. She has no rales.  Abdominal: Soft. Bowel sounds are normal. She exhibits no mass. There is no tenderness.  There is no rebound and no guarding.  Musculoskeletal: Normal range of motion. She exhibits no edema.  Lymphadenopathy:    She has no cervical adenopathy.  Neurological: She is alert and oriented to person, place, and time.  Skin: Skin is warm and dry. No rash noted.  Psychiatric: She has a normal mood and affect. Her behavior is normal. Judgment and thought content normal.  Nursing note and vitals reviewed.   BP 122/73 mmHg  Pulse 66  Temp(Src) 96.9 F (36.1 C) (Oral)  Ht 5\' 3"  (1.6 m)  Wt 154 lb (69.854 kg)  BMI 27.29 kg/m2       Assessment & Plan:  1. Serum calcium elevated -Hopefully since the patient is feeling better and her blood pressures under better control the serum calcium will be lower today. -She should continue with her follow-up with the nephrologist. -She has additional lab work which is pending in addition to the blood work that was done today. -We will call her with all of these results and make sure that Dr Hinda Lenis and it gets a copy of this - PTH, Intact and Calcium  2. Other fatigue -This is improved.  3. Loss of weight -Weight loss has stabilized.  4. Essential hypertension, benign -The blood pressure is better today with the 2-1/2 mg of amlodipine and she should continue to take this.  Patient Instructions  If the patient is going to have outpatient surgery where she is not put under general anesthesia and it is okay with the nephrologist it would probably be fine as long as her lab work is stable prior to the scheduled surgery The patient should continue with her current medication for her blood pressure She should follow through with her appointment with the nephrologist If she develops increasing fatigue or elevated blood pressure she should get back in touch with [place] She should continue to monitor blood pressures at home and watch her sodium intake   Arrie Senate MD

## 2015-05-30 ENCOUNTER — Telehealth: Payer: Self-pay | Admitting: Family Medicine

## 2015-05-30 ENCOUNTER — Other Ambulatory Visit: Payer: Self-pay | Admitting: Family Medicine

## 2015-05-30 NOTE — Telephone Encounter (Signed)
Spoke with patient.

## 2015-06-03 ENCOUNTER — Telehealth: Payer: Self-pay | Admitting: Family Medicine

## 2015-06-03 ENCOUNTER — Other Ambulatory Visit: Payer: Self-pay

## 2015-06-03 MED ORDER — AMLODIPINE BESYLATE 5 MG PO TABS: ORAL_TABLET | ORAL | Status: DC

## 2015-06-03 NOTE — Telephone Encounter (Signed)
Patient aware that the lab is checking to see why her labs are not back yet.

## 2015-06-04 ENCOUNTER — Other Ambulatory Visit: Payer: Medicare Other

## 2015-06-04 DIAGNOSIS — I10 Essential (primary) hypertension: Secondary | ICD-10-CM

## 2015-06-04 DIAGNOSIS — R634 Abnormal weight loss: Secondary | ICD-10-CM

## 2015-06-04 DIAGNOSIS — R5383 Other fatigue: Secondary | ICD-10-CM | POA: Diagnosis not present

## 2015-06-04 LAB — PTH, INTACT AND CALCIUM: PTH: 11 pg/mL — AB (ref 15–65)

## 2015-06-04 NOTE — Progress Notes (Signed)
Lab only 

## 2015-06-06 LAB — BMP8+EGFR
BUN/Creatinine Ratio: 21 (ref 11–26)
BUN: 26 mg/dL (ref 8–27)
CALCIUM: 9.3 mg/dL (ref 8.7–10.3)
CO2: 25 mmol/L (ref 18–29)
CREATININE: 1.26 mg/dL — AB (ref 0.57–1.00)
Chloride: 99 mmol/L (ref 96–106)
GFR calc Af Amer: 47 mL/min/{1.73_m2} — ABNORMAL LOW (ref >59)
GFR calc non Af Amer: 41 mL/min/{1.73_m2} — ABNORMAL LOW (ref >59)
Glucose: 91 mg/dL (ref 65–99)
POTASSIUM: 5.3 mmol/L — AB (ref 3.5–5.2)
SODIUM: 140 mmol/L (ref 134–144)

## 2015-06-06 LAB — THYROID PANEL WITH TSH
Free Thyroxine Index: 2.6 (ref 1.2–4.9)
T3 UPTAKE RATIO: 29 % (ref 24–39)
T4 TOTAL: 9 ug/dL (ref 4.5–12.0)
TSH: 0.368 u[IU]/mL — ABNORMAL LOW (ref 0.450–4.500)

## 2015-06-06 LAB — HEPATIC FUNCTION PANEL
ALBUMIN: 4.3 g/dL (ref 3.5–4.8)
ALT: 17 IU/L (ref 0–32)
AST: 22 IU/L (ref 0–40)
Alkaline Phosphatase: 93 IU/L (ref 39–117)
Bilirubin Total: <0.2 mg/dL (ref 0.0–1.2)
Bilirubin, Direct: 0.09 mg/dL (ref 0.00–0.40)
TOTAL PROTEIN: 7.1 g/dL (ref 6.0–8.5)

## 2015-06-06 LAB — PTH, INTACT AND CALCIUM: PTH: 11 pg/mL — ABNORMAL LOW (ref 15–65)

## 2015-06-06 NOTE — Progress Notes (Signed)
Patient aware.

## 2015-06-09 ENCOUNTER — Other Ambulatory Visit: Payer: Self-pay | Admitting: *Deleted

## 2015-06-09 DIAGNOSIS — E039 Hypothyroidism, unspecified: Secondary | ICD-10-CM

## 2015-06-10 ENCOUNTER — Other Ambulatory Visit: Payer: Self-pay | Admitting: *Deleted

## 2015-06-10 MED ORDER — AMLODIPINE BESYLATE 2.5 MG PO TABS: 2.5000 mg | ORAL_TABLET | Freq: Every day | ORAL | Status: DC

## 2015-06-11 DIAGNOSIS — Z1231 Encounter for screening mammogram for malignant neoplasm of breast: Secondary | ICD-10-CM | POA: Diagnosis not present

## 2015-06-11 LAB — HM MAMMOGRAPHY: HM MAMMO: NEGATIVE

## 2015-06-13 ENCOUNTER — Other Ambulatory Visit: Payer: Self-pay

## 2015-06-13 ENCOUNTER — Encounter: Payer: Self-pay | Admitting: *Deleted

## 2015-06-13 MED ORDER — AMLODIPINE BESYLATE 2.5 MG PO TABS: 2.5000 mg | ORAL_TABLET | Freq: Every day | ORAL | Status: DC

## 2015-07-07 ENCOUNTER — Other Ambulatory Visit: Payer: Self-pay | Admitting: Family Medicine

## 2015-07-12 ENCOUNTER — Ambulatory Visit (INDEPENDENT_AMBULATORY_CARE_PROVIDER_SITE_OTHER): Payer: Medicare Other | Admitting: Family Medicine

## 2015-07-12 VITALS — BP 117/77 | HR 71 | Temp 97.6°F | Ht 63.0 in | Wt 159.0 lb

## 2015-07-12 DIAGNOSIS — J0110 Acute frontal sinusitis, unspecified: Secondary | ICD-10-CM | POA: Diagnosis not present

## 2015-07-12 DIAGNOSIS — J189 Pneumonia, unspecified organism: Secondary | ICD-10-CM | POA: Diagnosis not present

## 2015-07-12 MED ORDER — CEFDINIR 300 MG PO CAPS: 300.0000 mg | ORAL_CAPSULE | Freq: Two times a day (BID) | ORAL | Status: DC

## 2015-07-12 NOTE — Patient Instructions (Signed)
Great to meet you!  Community-Acquired Pneumonia, Adult Pneumonia is an infection of the lungs. There are different types of pneumonia. One type can develop while a person is in a hospital. A different type, called community-acquired pneumonia, develops in people who are not, or have not recently been, in the hospital or other health care facility.  CAUSES Pneumonia may be caused by bacteria, viruses, or funguses. Community-acquired pneumonia is often caused by Streptococcus pneumonia bacteria. These bacteria are often passed from one person to another by breathing in droplets from the cough or sneeze of an infected person. RISK FACTORS The condition is more likely to develop in:  People who havechronic diseases, such as chronic obstructive pulmonary disease (COPD), asthma, congestive heart failure, cystic fibrosis, diabetes, or kidney disease.  People who haveearly-stage or late-stage HIV.  People who havesickle cell disease.  People who havehad their spleen removed (splenectomy).  People who havepoor Human resources officer.  People who havemedical conditions that increase the risk of breathing in (aspirating) secretions their own mouth and nose.   People who havea weakened immune system (immunocompromised).  People who smoke.  People whotravel to areas where pneumonia-causing germs commonly exist.  People whoare around animal habitats or animals that have pneumonia-causing germs, including birds, bats, rabbits, cats, and farm animals. SYMPTOMS Symptoms of this condition include:  Adry cough.  A wet (productive) cough.  Fever.  Sweating.  Chest pain, especially when breathing deeply or coughing.  Rapid breathing or difficulty breathing.  Shortness of breath.  Shaking chills.  Fatigue.  Muscle aches. DIAGNOSIS Your health care provider will take a medical history and perform a physical exam. You may also have other tests, including:  Imaging studies of your  chest, including X-rays.  Tests to check your blood oxygen level and other blood gases.  Other tests on blood, mucus (sputum), fluid around your lungs (pleural fluid), and urine. If your pneumonia is severe, other tests may be done to identify the specific cause of your illness. TREATMENT The type of treatment that you receive depends on many factors, such as the cause of your pneumonia, the medicines you take, and other medical conditions that you have. For most adults, treatment and recovery from pneumonia may occur at home. In some cases, treatment must happen in a hospital. Treatment may include:  Antibiotic medicines, if the pneumonia was caused by bacteria.  Antiviral medicines, if the pneumonia was caused by a virus.  Medicines that are given by mouth or through an IV tube.  Oxygen.  Respiratory therapy. Although rare, treating severe pneumonia may include:  Mechanical ventilation. This is done if you are not breathing well on your own and you cannot maintain a safe blood oxygen level.  Thoracentesis. This procedureremoves fluid around one lung or both lungs to help you breathe better. HOME CARE INSTRUCTIONS  Take over-the-counter and prescription medicines only as told by your health care provider.  Only takecough medicine if you are losing sleep. Understand that cough medicine can prevent your body's natural ability to remove mucus from your lungs.  If you were prescribed an antibiotic medicine, take it as told by your health care provider. Do not stop taking the antibiotic even if you start to feel better.  Sleep in a semi-upright position at night. Try sleeping in a reclining chair, or place a few pillows under your head.  Do not use tobacco products, including cigarettes, chewing tobacco, and e-cigarettes. If you need help quitting, ask your health care provider.  Drink enough  water to keep your urine clear or pale yellow. This will help to thin out mucus secretions  in your lungs. PREVENTION There are ways that you can decrease your risk of developing community-acquired pneumonia. Consider getting a pneumococcal vaccine if:  You are older than 80 years of age.  You are older than 80 years of age and are undergoing cancer treatment, have chronic lung disease, or have other medical conditions that affect your immune system. Ask your health care provider if this applies to you. There are different types and schedules of pneumococcal vaccines. Ask your health care provider which vaccination option is best for you. You may also prevent community-acquired pneumonia if you take these actions:  Get an influenza vaccine every year. Ask your health care provider which type of influenza vaccine is best for you.  Go to the dentist on a regular basis.  Wash your hands often. Use hand sanitizer if soap and water are not available. SEEK MEDICAL CARE IF:  You have a fever.  You are losing sleep because you cannot control your cough with cough medicine. SEEK IMMEDIATE MEDICAL CARE IF:  You have worsening shortness of breath.  You have increased chest pain.  Your sickness becomes worse, especially if you are an older adult or have a weakened immune system.  You cough up blood.   This information is not intended to replace advice given to you by your health care provider. Make sure you discuss any questions you have with your health care provider.   Document Released: 05/10/2005 Document Revised: 01/29/2015 Document Reviewed: 09/04/2014 Elsevier Interactive Patient Education [employer].

## 2015-07-12 NOTE — Progress Notes (Signed)
   HPI  Patient presents today here for acute illness, Saturday clinic.  Patient's line she said 5 days cough, headache, runny nose, and frontal sinus pressure.  She denies any malaise, difficulty breathing, or chest pain. She's tolerating food and fluids easily.  She explains that she began with a cough, it has improved a little bit, then she developed frontal head/sinus pressure and runny nose.  PMH: Smoking status noted ROS: Per HPI  Objective: BP 117/77 mmHg  Pulse 71  Temp(Src) 97.6 F (36.4 C) (Oral)  Ht 5\' 3"  (1.6 m)  Wt 159 lb (72.122 kg)  BMI 28.17 kg/m2 Gen: NAD, alert, cooperative with exam HEENT: NCAT, mild tenderness to palpation of frontal sinuses, TMs obscured by cerumen, oropharynx clear, MMM CV: RRR, good S1/S2, no murmur Resp: CTABL, no wheezes, non-labored, decreased lung sounds in the right upper lung field   Neuro: Alert and oriented, No gross deficits  Assessment and plan:  # Acute frontal sinusitis Also possible developing pneumonia with decreased lung sounds in the right upper lung field Omnicef Supportive care Return to clinic with any worsening symptoms or failure to improve   Laroy Apple, MD [facility] 07/12/2015, 9:14 AM

## 2015-07-16 DIAGNOSIS — Z79899 Other long term (current) drug therapy: Secondary | ICD-10-CM | POA: Diagnosis not present

## 2015-07-16 DIAGNOSIS — E559 Vitamin D deficiency, unspecified: Secondary | ICD-10-CM | POA: Diagnosis not present

## 2015-07-16 DIAGNOSIS — N183 Chronic kidney disease, stage 3 (moderate): Secondary | ICD-10-CM | POA: Diagnosis not present

## 2015-07-16 DIAGNOSIS — D509 Iron deficiency anemia, unspecified: Secondary | ICD-10-CM | POA: Diagnosis not present

## 2015-07-16 DIAGNOSIS — I10 Essential (primary) hypertension: Secondary | ICD-10-CM | POA: Diagnosis not present

## 2015-07-16 DIAGNOSIS — R809 Proteinuria, unspecified: Secondary | ICD-10-CM | POA: Diagnosis not present

## 2015-07-22 DIAGNOSIS — E559 Vitamin D deficiency, unspecified: Secondary | ICD-10-CM | POA: Diagnosis not present

## 2015-07-22 DIAGNOSIS — N179 Acute kidney failure, unspecified: Secondary | ICD-10-CM | POA: Diagnosis not present

## 2015-07-22 DIAGNOSIS — I10 Essential (primary) hypertension: Secondary | ICD-10-CM | POA: Diagnosis not present

## 2015-07-30 ENCOUNTER — Ambulatory Visit (INDEPENDENT_AMBULATORY_CARE_PROVIDER_SITE_OTHER): Payer: Medicare Other | Admitting: Family Medicine

## 2015-07-30 ENCOUNTER — Encounter: Payer: Self-pay | Admitting: Family Medicine

## 2015-07-30 VITALS — BP 138/79 | HR 56 | Temp 97.1°F | Ht 63.0 in | Wt 160.0 lb

## 2015-07-30 DIAGNOSIS — E119 Type 2 diabetes mellitus without complications: Secondary | ICD-10-CM | POA: Diagnosis not present

## 2015-07-30 DIAGNOSIS — H6123 Impacted cerumen, bilateral: Secondary | ICD-10-CM | POA: Diagnosis not present

## 2015-07-30 DIAGNOSIS — E1140 Type 2 diabetes mellitus with diabetic neuropathy, unspecified: Secondary | ICD-10-CM | POA: Insufficient documentation

## 2015-07-30 DIAGNOSIS — E039 Hypothyroidism, unspecified: Secondary | ICD-10-CM | POA: Diagnosis not present

## 2015-07-30 DIAGNOSIS — G629 Polyneuropathy, unspecified: Secondary | ICD-10-CM

## 2015-07-30 DIAGNOSIS — E785 Hyperlipidemia, unspecified: Secondary | ICD-10-CM | POA: Diagnosis not present

## 2015-07-30 DIAGNOSIS — K760 Fatty (change of) liver, not elsewhere classified: Secondary | ICD-10-CM | POA: Diagnosis not present

## 2015-07-30 MED ORDER — ATENOLOL 50 MG PO TABS: ORAL_TABLET | ORAL | Status: DC

## 2015-07-30 NOTE — Patient Instructions (Addendum)
Continue current medications. Continue good therapeutic lifestyle changes which include good diet and exercise. Fall precautions discussed with patient. If an FOBT was given today- please return it to our front desk. If you are over 80 years old - you may need Prevnar 12 or the adult Pneumonia vaccine.  Flu Shots will be available at our office starting mid- September. Please call and schedule a FLU CLINIC APPOINTMENT.                        Medicare Annual Wellness Visit  [facility] and the medical providers at [facility] strive to bring you the best medical care.  In doing so we not only want to address your current medical conditions and concerns but also to detect new conditions early and prevent illness, disease and health-related problems.    Medicare offers a yearly Wellness Visit which allows our clinical staff to assess your need for preventative services including immunizations, lifestyle education, counseling to decrease risk of preventable diseases and screening for fall risk and other medical concerns.    This visit is provided free of charge (no copay) for all Medicare recipients. The clinical pharmacists at [facility] have begun to conduct these Wellness Visits which will also include a thorough review of all your medications.    As you primary medical provider recommend that you make an appointment for your Annual Wellness Visit if you have not done so already this year.  You may set up this appointment before you leave today or you may call back [phone]) and schedule an appointment.  Please make sure when you call that you mention that you are scheduling your Annual Wellness Visit with the clinical pharmacist so that the appointment may be made for the proper length of time.     the patient should be okay for partial rotator cuff repair to the right shoulder.  She will continue her periodic follow-up with the nephrologist [place]  follow through with her visits to the orthopedic surgeon  She'll also continue the visits to her gastroenterologist  We will check her thyroid profile today Ann L no if her current dose of thyroid is appropriate to continue.  she can purchase Debrox eardrops over-the-counter and use 3-4 drops in each ear or the affected ear for 3 nights in a row and wait 1 week and repeat this and this should help soften the earwax.

## 2015-07-30 NOTE — Progress Notes (Signed)
Subjective:    Patient ID: Lisa Sutton, female    DOB: Nov 07, 1933, 82 y.o.   MRN: [id number]  the hemoglobin A1c at that time was 5.9%. The CBC at that time was well within normal limits with a hemoglobin of 14. The vitamin D level was good. The thyroid or TSH was low. HPI Patient is here today for a 4 month follow up. She is also complaining with her feet burning at night. She also wants to discuss rotator repair surgery.  She brings in blood pressures for review and all of these were good and will be scanned into the record. She also brings in blood work from [facility] and her blood sugar was 106. Creatinine was 1.40. The electrolytes including serum calcium were good. Her serum iron and iron saturation were good. The vitamin D level was low at 27.1. The serum ferritin hemoglobin and hematocrit and PTH were all within normal limits. She also had lab work done at our office back in early January an a copy of these results will be given to her take home. At that particular time her total LDL particle number and LDL C were elevated.  the most recent lab work was definitely improved Lelon Frohlich L looks like the only thing we may need to follow-up on is a thyroid profile. The patient does complain of some problems with swallowing and she has an appointment coming up soon with her gastroenterologist. She also has this partial rotator cuff tear in the right shoulder and I believe after seeing the nephrologist that she is okay now for the surgery to proceed. She also needs some arthroscopy on the knee and that will come later. She denies any chest pain or shortness of breath anymore than usual she denies any blood in the stool or black tarry bowel movements. She is passing her water without problems. After we recently found out that her TSH was low she reduced her thyroid medicine to 112 g daily +25 micrograms daily Monday through Friday and only takes 112 g on Saturday and Sunday.   Review of Systems    Constitutional: Negative.   HENT: Negative.   Eyes: Negative.   Respiratory: Negative.   Cardiovascular: Negative.   Gastrointestinal: Negative.   Endocrine: Negative.   Genitourinary: Negative.   Musculoskeletal: Negative.   Skin:       Feet burning at night  Allergic/Immunologic: Negative.   Neurological: Negative.   Hematological: Negative.   Psychiatric/Behavioral: Negative.          Patient Active Problem List   Diagnosis Date Noted  . Abnormal chest x-ray 11/27/2014  . Protein-calorie malnutrition, severe ([place]) 11/27/2014  . AKI (acute kidney injury) ([place]) 11/26/2014  . Hypercalcemia 11/26/2014  . Leukocytosis 11/26/2014  . Weight loss 11/26/2014  . Weakness 11/26/2014  . Insomnia 04/12/2013  . Hyperlipidemia 04/12/2013  . Essential hypertension, benign 10/13/2012  . GERD (gastroesophageal reflux disease) 04/25/2012  . Fatty liver 08/09/2011  . Hepatitis B 08/09/2011  . Dysphagia 08/09/2011  . Type 2 diabetes mellitus ([place]) 08/09/2011  . Hypothyroid 08/09/2011   Outpatient Encounter Prescriptions as of 07/30/2015  Medication Sig  . ACCU-CHEK AVIVA PLUS test strip Test blood sugar two times  daily  . acetaminophen (TYLENOL) 500 MG tablet Take 1,000 mg by mouth every 8 (eight) hours as needed for mild pain.  Marland Kitchen ALPRAZolam (XANAX) 0.25 MG tablet Take 1 tablet (0.25 mg total) by mouth 2 (two) times daily as needed for anxiety.  Marland Kitchen amLODipine (  NORVASC) 2.5 MG tablet Take 1 tablet (2.5 mg total) by mouth daily.  Marland Kitchen aspirin EC 81 MG tablet Take 81 mg by mouth daily.  Marland Kitchen atenolol (TENORMIN) 50 MG tablet Take 1 tablet by mouth  daily  . fish oil-omega-3 fatty acids 1000 MG capsule Take 1,000 mg by mouth daily.    . furosemide (LASIX) 20 MG tablet Take 2 tablets (40 mg total) by mouth daily.  Marland Kitchen levothyroxine (LEVOTHROID) 25 MCG tablet Take 1 tablet (25 mcg total) by mouth daily before breakfast.  . levothyroxine (SYNTHROID, LEVOTHROID) 112 MCG tablet Take 1 tablet (112 mcg  total) by mouth daily.  Marland Kitchen lisinopril (PRINIVIL,ZESTRIL) 20 MG tablet Take 1 tablet (20 mg total) by mouth daily.  . meclizine (ANTIVERT) 12.5 MG tablet TAKE  (1)  TABLET  FOUR TIMES DAILY AS NEEDED DIZZINESS  . metFORMIN (GLUCOPHAGE) 500 MG tablet Take 1 tablet (500 mg total) by mouth 2 (two) times daily with a meal.  . omeprazole (PRILOSEC) 20 MG capsule Take 1 capsule (20 mg total) by mouth daily. (Patient taking differently: Take 20 mg by mouth daily as needed (ACID REFLUX). )  . [DISCONTINUED] atenolol (TENORMIN) 50 MG tablet Take 1 tablet by mouth  daily  . [DISCONTINUED] cefdinir (OMNICEF) 300 MG capsule Take 1 capsule (300 mg total) by mouth 2 (two) times daily. 1 po BID   No facility-administered encounter medications on file as of 07/30/2015.       Objective:   Physical Exam  Constitutional: She is oriented to person, place, and time. She appears well-developed and well-nourished. No distress.  HENT:  Head: Normocephalic and atraumatic.  Left Ear: External ear normal.  Nose: Nose normal.  Mouth/Throat: Oropharynx is clear and moist.  Ears cerumen right ear canal  Eyes: Conjunctivae and EOM are normal. Pupils are equal, round, and reactive to light. Right eye exhibits no discharge. Left eye exhibits no discharge. No scleral icterus.  Neck: Normal range of motion. Neck supple. No thyromegaly present.  No bruits thyromegaly or anterior cervical adenopathy  Cardiovascular: Normal rate, regular rhythm, normal heart sounds and intact distal pulses.   No murmur heard. The heart was regular at 72/m  Pulmonary/Chest: Effort normal and breath sounds normal. No respiratory distress. She has no wheezes. She has no rales. She exhibits no tenderness.  Clear anteriorly and posteriorly  Abdominal: Soft. Bowel sounds are normal. She exhibits no mass. There is no tenderness. There is no rebound and no guarding.  Nontender without organomegaly or bruits  Musculoskeletal: Normal range of motion.  She exhibits no edema or tenderness.  Lymphadenopathy:    She has no cervical adenopathy.  Neurological: She is alert and oriented to person, place, and time. She has normal reflexes. No cranial nerve deficit.  Skin: Skin is warm and dry. No rash noted.  Psychiatric: She has a normal mood and affect. Her behavior is normal. Judgment and thought content normal.  Nursing note and vitals reviewed.  BP 138/79 mmHg  Pulse 56  Temp(Src) 97.1 F (36.2 C) (Oral)  Ht 5\' 3"  (1.6 m)  Wt 160 lb (72.576 kg)  BMI 28.35 kg/m2        Assessment & Plan:  1. Type 2 diabetes mellitus without complication, without long-term current use of insulin (HCC) -Continue with aggressive therapeutic lifestyle changes - Microalbumin / creatinine urine ratio  2. Hypothyroidism, unspecified hypothyroidism type -She actually became hyperthyroid and her medication has been reduced slightly and we will recheck the thyroid profile today to  make sure that it is within the normal range. She will continue with 112 g daily +25 g daily Monday through Friday. She will only take 112 g on Saturday and Sunday. - TSH  3. Hyperlipidemia -Continue with aggressive therapeutic lifestyle changes  4. Hypercalcemia -The most recent serum calcium was good and no further testing will be done regarding this.  5. Fatty liver -Continue to work on diet and exercise  6. Peripheral polyneuropathy ([place]) -For now we will continue to monitor this and it is most likely associated with her diabetes.  7. Cerumen debris on tympanic membrane of both ears -The ears cerumen is only in the right ear canal she will use Debrox for this.  Meds ordered this encounter  Medications  . atenolol (TENORMIN) 50 MG tablet    Sig: Take 1 tablet by mouth  daily    Dispense:  90 tablet    Refill:  1   Patient Instructions  Continue current medications. Continue good therapeutic lifestyle changes which include good diet and exercise. Fall  precautions discussed with patient. If an FOBT was given today- please return it to our front desk. If you are over 39 years old - you may need Prevnar 42 or the adult Pneumonia vaccine.  Flu Shots will be available at our office starting mid- September. Please call and schedule a FLU CLINIC APPOINTMENT.                        Medicare Annual Wellness Visit  [facility] and the medical providers at [facility] strive to bring you the best medical care.  In doing so we not only want to address your current medical conditions and concerns but also to detect new conditions early and prevent illness, disease and health-related problems.    Medicare offers a yearly Wellness Visit which allows our clinical staff to assess your need for preventative services including immunizations, lifestyle education, counseling to decrease risk of preventable diseases and screening for fall risk and other medical concerns.    This visit is provided free of charge (no copay) for all Medicare recipients. The clinical pharmacists at [facility] have begun to conduct these Wellness Visits which will also include a thorough review of all your medications.    As you primary medical provider recommend that you make an appointment for your Annual Wellness Visit if you have not done so already this year.  You may set up this appointment before you leave today or you may call back [phone]) and schedule an appointment.  Please make sure when you call that you mention that you are scheduling your Annual Wellness Visit with the clinical pharmacist so that the appointment may be made for the proper length of time.     the patient should be okay for partial rotator cuff repair to the right shoulder.  She will continue her periodic follow-up with the nephrologist Amaral follow through with her visits to the orthopedic surgeon  She'll also continue the visits to her gastroenterologist   We will check her thyroid profile today Ann L no if her current dose of thyroid is appropriate to continue.  she can purchase Debrox eardrops over-the-counter and use 3-4 drops in each ear or the affected ear for 3 nights in a row and wait 1 week and repeat this and this should help soften the earwax.   Arrie Senate MD

## 2015-07-31 LAB — MICROALBUMIN / CREATININE URINE RATIO
Creatinine, Urine: 101.1 mg/dL
MICROALB/CREAT RATIO: 29.3 mg/g creat (ref 0.0–30.0)
Microalbumin, Urine: 29.6 ug/mL

## 2015-07-31 LAB — TSH: TSH: 0.374 u[IU]/mL — AB (ref 0.450–4.500)

## 2015-07-31 NOTE — Addendum Note (Signed)
Addended by: Jamelle Haring on: 07/31/2015 11:39 AM   Modules accepted: Orders

## 2015-08-12 DIAGNOSIS — M1712 Unilateral primary osteoarthritis, left knee: Secondary | ICD-10-CM | POA: Diagnosis not present

## 2015-08-12 DIAGNOSIS — M25511 Pain in right shoulder: Secondary | ICD-10-CM | POA: Diagnosis not present

## 2015-08-12 DIAGNOSIS — M25562 Pain in left knee: Secondary | ICD-10-CM | POA: Diagnosis not present

## 2015-08-13 ENCOUNTER — Other Ambulatory Visit: Payer: Self-pay | Admitting: Orthopaedic Surgery

## 2015-08-19 ENCOUNTER — Encounter (INDEPENDENT_AMBULATORY_CARE_PROVIDER_SITE_OTHER): Payer: Self-pay | Admitting: Internal Medicine

## 2015-08-19 ENCOUNTER — Ambulatory Visit (INDEPENDENT_AMBULATORY_CARE_PROVIDER_SITE_OTHER): Payer: Medicare Other | Admitting: Internal Medicine

## 2015-08-19 ENCOUNTER — Encounter (INDEPENDENT_AMBULATORY_CARE_PROVIDER_SITE_OTHER): Payer: Self-pay | Admitting: *Deleted

## 2015-08-19 VITALS — BP 128/90 | HR 68 | Temp 97.9°F | Resp 18 | Ht 63.0 in | Wt 162.7 lb

## 2015-08-19 DIAGNOSIS — K219 Gastro-esophageal reflux disease without esophagitis: Secondary | ICD-10-CM | POA: Diagnosis not present

## 2015-08-19 DIAGNOSIS — R1314 Dysphagia, pharyngoesophageal phase: Secondary | ICD-10-CM | POA: Diagnosis not present

## 2015-08-19 DIAGNOSIS — R1310 Dysphagia, unspecified: Secondary | ICD-10-CM

## 2015-08-19 DIAGNOSIS — R1319 Other dysphagia: Secondary | ICD-10-CM

## 2015-08-19 NOTE — Progress Notes (Addendum)
Presenting complaint;  Solid food dysphagia. History of GERD.  Subjective:  Lisa Sutton is 82 year old Caucasian female who is here for scheduled visit. She was last seen in October 2016. She presents with dysphagia of few months duration. She is having problems with starchy foods as well as rice. She has no difficulty with meat. Foodborne bolus always goes down. She says heartburns well controlled with when necessary Zegerid. She takes no more than 2 or 3 times a week. She is watching her diet. She says Prilosec does not work. She also complains of lower back pain when she wakes up. She says pain goes away S&S she has a bowel movement. She denies diarrhea constipation melena or rectal bleeding. She also denies abdominal pain. She sister and calcium levels have remained normal since she came off HCTZ. She is having constant left knee pain. She is scheduled to undergo knee replacement on 09/15/2015. She is also having right shoulder pain due to rotator cuff tear and she plans to have it fixed at a later date.   Current Medications: Outpatient Encounter Prescriptions as of 08/19/2015  Medication Sig  . ACCU-CHEK AVIVA PLUS test strip Test blood sugar two times  daily  . acetaminophen (TYLENOL) 500 MG tablet Take 1,000 mg by mouth every 8 (eight) hours as needed for mild pain.  Marland Kitchen ALPRAZolam (XANAX) 0.25 MG tablet Take 1 tablet (0.25 mg total) by mouth 2 (two) times daily as needed for anxiety.  Marland Kitchen amLODipine (NORVASC) 2.5 MG tablet Take 1 tablet (2.5 mg total) by mouth daily.  Marland Kitchen aspirin EC 81 MG tablet Take 81 mg by mouth daily.  Marland Kitchen atenolol (TENORMIN) 50 MG tablet Take 1 tablet by mouth  daily  . furosemide (LASIX) 20 MG tablet Take 2 tablets (40 mg total) by mouth daily.  Marland Kitchen levothyroxine (LEVOTHROID) 25 MCG tablet Take 1 tablet (25 mcg total) by mouth daily before breakfast.  . levothyroxine (SYNTHROID, LEVOTHROID) 112 MCG tablet Take 1 tablet (112 mcg total) by mouth daily.  Marland Kitchen lisinopril  (PRINIVIL,ZESTRIL) 20 MG tablet Take 1 tablet (20 mg total) by mouth daily.  . metFORMIN (GLUCOPHAGE) 500 MG tablet Take 1 tablet (500 mg total) by mouth 2 (two) times daily with a meal.  . Omeprazole-Sodium Bicarbonate (ZEGERID) 20-1100 MG CAPS capsule Take 1 capsule by mouth daily as needed.  . [DISCONTINUED] fish oil-omega-3 fatty acids 1000 MG capsule Take 1,000 mg by mouth daily. Reported on 08/19/2015  . [DISCONTINUED] meclizine (ANTIVERT) 12.5 MG tablet TAKE  (1)  TABLET  FOUR TIMES DAILY AS NEEDED DIZZINESS (Patient not taking: Reported on 08/19/2015)  . [DISCONTINUED] omeprazole (PRILOSEC) 20 MG capsule Take 1 capsule (20 mg total) by mouth daily. (Patient not taking: Reported on 08/19/2015)   No facility-administered encounter medications on file as of 08/19/2015.     Objective: Blood pressure 128/90, pulse 68, temperature 97.9 F (36.6 C), temperature source Oral, resp. rate 18, height 5\' 3"  (1.6 m), weight 162 lb 11.2 oz (73.8 kg). Patient is alert and in no acute distress. Conjunctiva is pink. Sclera is nonicteric Oropharyngeal mucosa is normal. No neck masses or thyromegaly noted. Cardiac exam with regular rhythm normal S1 and S2. No murmur or gallop noted. Lungs are clear to auscultation. Abdomen is full but soft and nontender without organomegaly or masses. She has small umbilical hernia which is completely reducible. No LE edema or clubbing noted.  Labs/studies Results: CBC noted from one 08/11/2015  WBC 5.5, H&H 14 and 40.8 and platelet count 292K.  LFTs from 06/04/2015  Bilirubin less than 0.2, AP 93, AST 22, ALT 17, total protein 7.1 and albumin 4.3.    Assessment:  #1. Chronic GERD. She is on dietary measures and when necessary Zegerid which seem to be helping. #2. Solid food dysphagia. Last EGD with ED was in March 2013 when she was noted to have soft stricture at GE junction. She was also felt to have esophageal web. Dysphagia appears to be with certain foods  such as rice but not with meat or bread. Therefore she may have motility disorder. #3. History of positive hepatitis B surface antigen. HBV DNA titers undetectable and she is deemed to be inactive carrier and no therapy indicated.    Plan:  Barium pill esophagogram. Office visit in 6 months.

## 2015-08-19 NOTE — Patient Instructions (Signed)
Barium pill esophagogram to be scheduled. 

## 2015-08-28 ENCOUNTER — Ambulatory Visit (HOSPITAL_COMMUNITY)
Admission: RE | Admit: 2015-08-28 | Discharge: 2015-08-28 | Disposition: A | Discharge status: Home / Self Care | Payer: Medicare Other | Source: Ambulatory Visit | Attending: Internal Medicine | Admitting: Internal Medicine

## 2015-08-28 DIAGNOSIS — R1310 Dysphagia, unspecified: Secondary | ICD-10-CM | POA: Diagnosis not present

## 2015-08-28 DIAGNOSIS — K449 Diaphragmatic hernia without obstruction or gangrene: Secondary | ICD-10-CM | POA: Diagnosis not present

## 2015-08-28 DIAGNOSIS — R1319 Other dysphagia: Secondary | ICD-10-CM

## 2015-08-28 DIAGNOSIS — R1314 Dysphagia, pharyngoesophageal phase: Secondary | ICD-10-CM | POA: Diagnosis not present

## 2015-09-01 DIAGNOSIS — L821 Other seborrheic keratosis: Secondary | ICD-10-CM | POA: Diagnosis not present

## 2015-09-01 DIAGNOSIS — D239 Other benign neoplasm of skin, unspecified: Secondary | ICD-10-CM | POA: Diagnosis not present

## 2015-09-01 DIAGNOSIS — L814 Other melanin hyperpigmentation: Secondary | ICD-10-CM | POA: Diagnosis not present

## 2015-09-03 ENCOUNTER — Encounter (HOSPITAL_COMMUNITY)
Admission: RE | Admit: 2015-09-03 | Discharge: 2015-09-03 | Disposition: A | Discharge status: Home / Self Care | Payer: Medicare Other | Source: Ambulatory Visit | Attending: Orthopaedic Surgery | Admitting: Orthopaedic Surgery

## 2015-09-03 ENCOUNTER — Encounter (HOSPITAL_COMMUNITY): Payer: Self-pay

## 2015-09-03 DIAGNOSIS — Z7982 Long term (current) use of aspirin: Secondary | ICD-10-CM | POA: Insufficient documentation

## 2015-09-03 DIAGNOSIS — Z01818 Encounter for other preprocedural examination: Secondary | ICD-10-CM | POA: Insufficient documentation

## 2015-09-03 DIAGNOSIS — K760 Fatty (change of) liver, not elsewhere classified: Secondary | ICD-10-CM | POA: Diagnosis not present

## 2015-09-03 DIAGNOSIS — Z8619 Personal history of other infectious and parasitic diseases: Secondary | ICD-10-CM | POA: Insufficient documentation

## 2015-09-03 DIAGNOSIS — K219 Gastro-esophageal reflux disease without esophagitis: Secondary | ICD-10-CM | POA: Diagnosis not present

## 2015-09-03 DIAGNOSIS — Z7984 Long term (current) use of oral hypoglycemic drugs: Secondary | ICD-10-CM | POA: Diagnosis not present

## 2015-09-03 DIAGNOSIS — Z01812 Encounter for preprocedural laboratory examination: Secondary | ICD-10-CM | POA: Diagnosis not present

## 2015-09-03 DIAGNOSIS — I10 Essential (primary) hypertension: Secondary | ICD-10-CM | POA: Insufficient documentation

## 2015-09-03 DIAGNOSIS — M1712 Unilateral primary osteoarthritis, left knee: Secondary | ICD-10-CM | POA: Diagnosis not present

## 2015-09-03 DIAGNOSIS — E119 Type 2 diabetes mellitus without complications: Secondary | ICD-10-CM | POA: Diagnosis not present

## 2015-09-03 DIAGNOSIS — Z79899 Other long term (current) drug therapy: Secondary | ICD-10-CM | POA: Diagnosis not present

## 2015-09-03 HISTORY — DX: Nausea with vomiting, unspecified: R11.2

## 2015-09-03 HISTORY — DX: Other specified postprocedural states: Z98.890

## 2015-09-03 HISTORY — DX: Unspecified osteoarthritis, unspecified site: M19.90

## 2015-09-03 HISTORY — DX: Personal history of other diseases of the digestive system: Z87.19

## 2015-09-03 HISTORY — DX: Cardiac arrhythmia, unspecified: I49.9

## 2015-09-03 LAB — COMPREHENSIVE METABOLIC PANEL
ALT: 19 U/L (ref 14–54)
ANION GAP: 13 (ref 5–15)
AST: 29 U/L (ref 15–41)
Albumin: 3.8 g/dL (ref 3.5–5.0)
Alkaline Phosphatase: 67 U/L (ref 38–126)
BUN: 44 mg/dL — AB (ref 6–20)
CHLORIDE: 101 mmol/L (ref 101–111)
CO2: 25 mmol/L (ref 22–32)
Calcium: 10.5 mg/dL — ABNORMAL HIGH (ref 8.9–10.3)
Creatinine, Ser: 1.82 mg/dL — ABNORMAL HIGH (ref 0.44–1.00)
GFR calc Af Amer: 29 mL/min — ABNORMAL LOW (ref >60)
GFR, EST NON AFRICAN AMERICAN: 25 mL/min — AB (ref >60)
GLUCOSE: 134 mg/dL — AB (ref 65–99)
POTASSIUM: 4.5 mmol/L (ref 3.5–5.1)
SODIUM: 139 mmol/L (ref 135–145)
TOTAL PROTEIN: 7.2 g/dL (ref 6.5–8.1)
Total Bilirubin: 0.3 mg/dL (ref 0.3–1.2)

## 2015-09-03 LAB — PROTIME-INR
INR: 0.95 (ref 0.00–1.49)
Prothrombin Time: 12.9 seconds (ref 11.6–15.2)

## 2015-09-03 LAB — URINE MICROSCOPIC-ADD ON: RBC / HPF: NONE SEEN RBC/hpf (ref 0–5)

## 2015-09-03 LAB — URINALYSIS, ROUTINE W REFLEX MICROSCOPIC
BILIRUBIN URINE: NEGATIVE
Glucose, UA: NEGATIVE mg/dL
Hgb urine dipstick: NEGATIVE
Ketones, ur: NEGATIVE mg/dL
NITRITE: NEGATIVE
PH: 5 (ref 5.0–8.0)
Protein, ur: NEGATIVE mg/dL
SPECIFIC GRAVITY, URINE: 1.022 (ref 1.005–1.030)

## 2015-09-03 LAB — CBC
HCT: 41.5 % (ref 36.0–46.0)
Hemoglobin: 13.7 g/dL (ref 12.0–15.0)
MCH: 30.2 pg (ref 26.0–34.0)
MCHC: 33 g/dL (ref 30.0–36.0)
MCV: 91.4 fL (ref 78.0–100.0)
PLATELETS: 324 10*3/uL (ref 150–400)
RBC: 4.54 MIL/uL (ref 3.87–5.11)
RDW: 13 % (ref 11.5–15.5)
WBC: 9.3 10*3/uL (ref 4.0–10.5)

## 2015-09-03 LAB — SURGICAL PCR SCREEN
MRSA, PCR: NEGATIVE
Staphylococcus aureus: NEGATIVE

## 2015-09-03 LAB — GLUCOSE, CAPILLARY: GLUCOSE-CAPILLARY: 169 mg/dL — AB (ref 65–99)

## 2015-09-03 NOTE — Pre-Procedure Instructions (Addendum)
Lucrezia J Salton  09/03/2015      [facility] [place] [place] [address] Phone: [phone] Fax: [place], [place] [address] [place] [address] [place] [address] Phone: [phone] Fax: [phone]    Your procedure is scheduled on Monday April 24th.  Report to [facility] Admitting at 1030 AM.  Call this number if you have problems the morning of surgery:  [phone]   Remember:  Do not eat food or drink liquids after midnight Sunday April 23rd.  Take these medicines the morning of surgery with A SIP OF WATER amlodipine (norvasc), atenolol (tenormin), levothyroxine (synthroid, levothroid), omeprazole-sodium bicarbonate (zegerid)  STOP: ALL Vitamins, Supplements, Effient and Herbal Medications, Fish Oils, Aspirins, NSAIDs (Nonsteroidal Anti-inflammatories such as Ibuprofen, Aleve, or Advil), and Goody's/BC Powders 7 days prior to surgery, until after surgery as directed by your physician.      How to Manage Your Diabetes Before and After Surgery  Why is it important to control my blood sugar before and after surgery? . Improving blood sugar levels before and after surgery helps healing and can limit problems. . A way of improving blood sugar control is eating a healthy diet by: o  Eating less sugar and carbohydrates o  Increasing activity/exercise o  Talking with your doctor about reaching your blood sugar goals . High blood sugars (greater than 180 mg/dL) can raise your risk of infections and slow your recovery, so you will need to focus on controlling your diabetes during the weeks before surgery. . Make sure that the doctor who takes care of your diabetes knows about your planned surgery including the date and location.  How do I manage my blood sugar before surgery? . Check your blood sugar at least 4 times a day, starting 2 days  before surgery, to make sure that the level is not too high or low. o Check your blood sugar the morning of your surgery when you wake up and every 2 hours until you get to the Short Stay unit. . If your blood sugar is less than 70 mg/dL, you will need to treat for low blood sugar: o Do not take insulin. o Treat a low blood sugar (less than 70 mg/dL) with  cup of clear juice (cranberry or apple), 4 glucose tablets, OR glucose gel. o Recheck blood sugar in 15 minutes after treatment (to make sure it is greater than 70 mg/dL). If your blood sugar is not greater than 70 mg/dL on recheck, call [phone] for further instructions. . Report your blood sugar to the short stay nurse when you get to Short Stay.  . If you are admitted to the hospital after surgery: o Your blood sugar will be checked by the staff and you will probably be given insulin after surgery (instead of oral diabetes medicines) to make sure you have good blood sugar levels. o The goal for blood sugar control after surgery is 80-180 mg/dL.      WHAT DO I DO ABOUT MY DIABETES MEDICATION?   Marland Kitchen Do not take oral diabetes medicines (pills) the morning of surgery. (metformin/glucophage)   Do not wear jewelry, make-up or nail polish.  Do not wear lotions, powders, or perfumes.  You may wear deodorant.  Do not shave 48 hours prior to surgery.  Men may shave face and neck.  Do not bring valuables to the  hospital.  [facility] is not responsible for any belongings or valuables.  Contacts, dentures or bridgework may not be worn into surgery.  Leave your suitcase in the car.  After surgery it may be brought to your room.  For patients admitted to the hospital, discharge time will be determined by your treatment team.  Patients discharged the day of surgery will not be allowed to drive home.   Special instructions:       Preparing for Surgery at [facility]  Before surgery, you can play an important role.  Because skin is not  sterile, your skin needs to be as free of germs as possible.  You can reduce the number of germs on your skin by washing with CHG (chlorahexidine gluconate) Soap before surgery.  CHG is an antiseptic cleaner with kills germs and bonds with the skin to continue killing germs even after washing.   Please do not use if you have an allergy to CHG or antibacterial soaps.  If your skin becomes reddened/irritated stop using the CHG.  Do not shave (including legs and underarms) for at least 48 hours prior to first CHG shower.  It is okay to shave your face.  Please follow these instructions carefully:  1. Shower with CHG Soap the night before surgery and the morning of Surgery. 2. If you choose to wash your hair, wash your hair first as usual with your normal shampoo. 3. After you shampoo, rinse your hair and body thoroughly to remove the Shampoo. 4. Use CHG as you would any other liquid soap. You can apply chg directly to the skin and wash gently with scrungie or a clean washcloth. 5. Apply the CHG Soap to your body ONLY FROM THE NECK DOWN. Do not use on open wounds or open sores. Avoid contact with your eyes, ears, mouth and genitals (private parts). Wash genitals (private parts) with your normal soap. 6. Wash thoroughly, paying special attention to the area where your surgery will be performed. 7. Thoroughly rinse your body with warm water from the neck down. 8. DO NOT shower/wash with your normal soap after using and rinsing off the CHG Soap. 9. Pat yourself dry with a clean towel.  10. Wear clean pajamas.  11. Place clean sheets on your bed the night of your first shower and do not sleep with pets.  Day of Surgery  Do not apply any lotions/deodorants the morning of surgery. Please wear clean clothes to the hospital/surgery center.   Please read over the  following fact sheets that you were given. Pain Booklet, Coughing and Deep Breathing, Total Joint Packet, MRSA Information and Surgical Site Infection Prevention

## 2015-09-03 NOTE — Progress Notes (Addendum)
Patient's PCP is Redge Gainer MD.  Patient saw a cardiologist over 20 years ago for a 'skipping' of her heart.  States she had 'some testing done but everything checked out okay and I haven't had any skipping since'.  Patient states she saw Dr. Ron Parker at [facility].    [facility] saw [facility] for [facility] of renal cyst approx 3 months ago will request records.  Saw Dr. Cruzita Lederer for endocrinology as well (all post- hospital admission last July at [facility]).

## 2015-09-04 ENCOUNTER — Other Ambulatory Visit: Payer: Self-pay | Admitting: Family Medicine

## 2015-09-04 LAB — HEMOGLOBIN A1C
HEMOGLOBIN A1C: 6.2 % — AB (ref 4.8–5.6)
MEAN PLASMA GLUCOSE: 131 mg/dL

## 2015-09-04 NOTE — Progress Notes (Addendum)
Anesthesia Chart Review:  Pt is a 82 year old female scheduled for computer assisted L total knee arthroplasty on 09/15/2015 with Dr. Lorin Mercy.   PCP is Dr. Redge Gainer. Nephrologist is Dr. Lowanda Foster.   PMH includes:  HTN, DM, fatty liver, hepatitis B (at age 48), dysrhythmia (heart used to "skip" beats 20 years ago but no longer), post-op N/V, GERD. Never smoker. BMI 28  Medications include: amlodipine, ASA, atenolol, lasix, levothyroxine, lisinopril, metformin, zegerid.   Preoperative labs reviewed.   - HgbA1c 6.2, glucose 134 - Cr 1.82, BUN 44. Prior Cr results range 1.27-1.92 over last 9 months. I routed these results to Dr. Laurance Flatten for his review.   Chest x-ray 04/28/15 reviewed. Right middle lobe scarring versus chronic atelectasis. No acute findings.  EKG 04/28/15: sinus rhythm  Willeen Cass, FNP-BC [facility] Short Stay Surgical Center/Anesthesiology Phone: [phone] 09/04/2015 3:26 PM  Addendum:   Pt had labs redrawn 09/08/15, Cr now 1.24, BUN 24. Dr. Laurance Flatten comments on the results that pt's nephrologist should get copy of results and should discuss with surgeon prior to surgery. I notified Malachy Mood in Dr. Lorin Mercy' office.   If no changes, I anticipate pt can proceed with surgery as scheduled.   Willeen Cass, FNP-BC [facility] Short Stay Surgical Center/Anesthesiology Phone: [phone] 09/10/2015 9:00 AM

## 2015-09-08 ENCOUNTER — Other Ambulatory Visit: Payer: Self-pay | Admitting: *Deleted

## 2015-09-08 ENCOUNTER — Other Ambulatory Visit: Payer: Medicare Other

## 2015-09-08 DIAGNOSIS — R748 Abnormal levels of other serum enzymes: Secondary | ICD-10-CM | POA: Diagnosis not present

## 2015-09-08 DIAGNOSIS — R7989 Other specified abnormal findings of blood chemistry: Secondary | ICD-10-CM

## 2015-09-08 DIAGNOSIS — E039 Hypothyroidism, unspecified: Secondary | ICD-10-CM

## 2015-09-09 LAB — BMP8+EGFR
BUN/Creatinine Ratio: 19 (ref 12–28)
BUN: 24 mg/dL (ref 8–27)
CALCIUM: 9.6 mg/dL (ref 8.7–10.3)
CHLORIDE: 101 mmol/L (ref 96–106)
CO2: 19 mmol/L (ref 18–29)
Creatinine, Ser: 1.24 mg/dL — ABNORMAL HIGH (ref 0.57–1.00)
GFR, EST AFRICAN AMERICAN: 48 mL/min/{1.73_m2} — AB (ref >59)
GFR, EST NON AFRICAN AMERICAN: 41 mL/min/{1.73_m2} — AB (ref >59)
Glucose: 146 mg/dL — ABNORMAL HIGH (ref 65–99)
Potassium: 4.1 mmol/L (ref 3.5–5.2)
Sodium: 141 mmol/L (ref 134–144)

## 2015-09-09 LAB — TSH: TSH: 0.735 u[IU]/mL (ref 0.450–4.500)

## 2015-09-10 ENCOUNTER — Telehealth: Payer: Self-pay | Admitting: *Deleted

## 2015-09-10 NOTE — Telephone Encounter (Signed)
Spoke with nurse at [facility] - she looked at the labs and states the surgery should be fine = creatinine was better on 09/08/15.

## 2015-09-11 ENCOUNTER — Encounter: Payer: Self-pay | Admitting: Pharmacist

## 2015-09-11 ENCOUNTER — Ambulatory Visit (INDEPENDENT_AMBULATORY_CARE_PROVIDER_SITE_OTHER): Payer: Medicare Other | Admitting: Pharmacist

## 2015-09-11 VITALS — BP 124/72 | HR 66 | Ht 63.0 in | Wt 160.0 lb

## 2015-09-11 DIAGNOSIS — Z789 Other specified health status: Secondary | ICD-10-CM | POA: Insufficient documentation

## 2015-09-11 DIAGNOSIS — Z0000 Encounter for general adult medical examination without abnormal findings: Secondary | ICD-10-CM | POA: Diagnosis not present

## 2015-09-11 NOTE — Progress Notes (Signed)
Patient ID: SHARNISE PORT, female   DOB: 1933-05-31, 82 y.o.   MRN: [id number]    Subjective:   ADEL CONTES is a 82 y.o. female who presents for an Initial Medicare Annual Wellness Visit. Mrs. Monohan is widowed and lives alone in [place], [place].  Patient reports that her only health concerns are upcoming knee surgery, kidney function and future shsoulder surgery. She is scheduled for knee surgery Monday, April 24th but recent labs showed an elevated serum creatinine.  She has BMET rechecked yesterday and serum creatinine has improved.   The plan is for Dr Lorin Mercy to perform shoulder surgery after knee surgery.   Review of Systems  Review of Systems  Constitutional: Negative.   HENT: Negative.   Eyes: Negative.   Cardiovascular: Negative.   Gastrointestinal: Positive for heartburn.  Genitourinary: Negative.   Musculoskeletal: Positive for joint pain. Back pain: knee pain.  Skin: Negative.   Neurological: Negative.   Endo/Heme/Allergies: Negative.   Psychiatric/Behavioral: Negative.        Current Medications (verified) Outpatient Encounter Prescriptions as of 09/11/2015  Medication Sig  . ACCU-CHEK AVIVA PLUS test strip Test blood sugar two times  daily  . ALPRAZolam (XANAX) 0.25 MG tablet Take 1 tablet (0.25 mg total) by mouth 2 (two) times daily as needed for anxiety. (Patient taking differently: Take 0.25 mg by mouth at bedtime as needed for anxiety. )  . amLODipine (NORVASC) 2.5 MG tablet Take 1 tablet by mouth  daily  . aspirin EC 81 MG tablet Take 81 mg by mouth daily.  Marland Kitchen atenolol (TENORMIN) 50 MG tablet Take 1 tablet by mouth  daily  . furosemide (LASIX) 20 MG tablet Take 2 tablets (40 mg total) by mouth daily. (Patient taking differently: Take 20 mg by mouth daily. )  . levothyroxine (LEVOTHROID) 25 MCG tablet Take 1 tablet (25 mcg total) by mouth daily before breakfast.  . levothyroxine (SYNTHROID, LEVOTHROID) 112 MCG tablet Take 1 tablet (112 mcg total) by mouth  daily.  Marland Kitchen lisinopril (PRINIVIL,ZESTRIL) 20 MG tablet Take 1 tablet (20 mg total) by mouth daily.  . metFORMIN (GLUCOPHAGE) 500 MG tablet Take 1 tablet (500 mg total) by mouth 2 (two) times daily with a meal.  . Omeprazole-Sodium Bicarbonate (ZEGERID) 20-1100 MG CAPS capsule Take 1 capsule by mouth daily as needed (for heartburn or acid reflux).   Marland Kitchen acetaminophen (TYLENOL) 500 MG tablet Take 1,000 mg by mouth daily as needed for mild pain. Reported on 09/11/2015  . Multiple Vitamins-Minerals (CENTRUM SILVER ADULT 50+) TABS Take 1 tablet by mouth daily. Reported on 09/11/2015   No facility-administered encounter medications on file as of 09/11/2015.    Allergies (verified) Statins; Zetia; Livalo; and Zocor   History: Past Medical History  Diagnosis Date  . Type 2 diabetes mellitus ([place])   . Essential hypertension, benign   . GERD (gastroesophageal reflux disease)   . Osteoporosis   . Obesity   . Hypothyroidism   . Dysphagia     'sometimes but not a major issue' been checked out by GI (per pt)  . Fatty liver   . Kidney stone August 2014    Patient was seen at [facility]  . PONV (postoperative nausea and vomiting)   . Dysrhythmia     'heart used to skip but doesn't anymore' was checked out by Dr. Ron Parker late '90s, everything checked out ok and not had any skipping since (all per pt)  . Hepatitis B     had at age  7, 'GI doc said it's gone away'  . History of hiatal hernia   . Arthritis   . Cataract   . Hypercalcemia   . Anxiety    Past Surgical History  Procedure Laterality Date  . Abdominal hysterectomy    . Deviated septum repair    . Knee arthroscopy      left knee 2003  . Cataract extraction, bilateral    . Bilateral carpal tunnel release      Dr. Marlou Sa at surgical center  . Eye surgery     Family History  Problem Relation Age of Onset  . Hypertension Father   . Hip fracture Father   . Cancer Maternal Aunt   . Diabetes Maternal Aunt   . Diabetes Maternal Uncle     Social History   Occupational History  . Not on file.   Social History Main Topics  . Smoking status: Never Smoker   . Smokeless tobacco: Never Used  . Alcohol Use: No  . Drug Use: No  . Sexual Activity: No    Do you feel safe at home?  Yes Are there smokers in your home (other than you)? No  Dietary issues and exercise activities discussed: Current Exercise Habits: The patient does not participate in regular exercise at present, Exercise limited by: orthopedic condition(s) (knee surgery planned for 09/15/2015)  Current Dietary habits:  Limits portions sizes but has a weakness for sweets. Cardiac Risk Factors include: advanced age (>30men, >69 women);diabetes mellitus;dyslipidemia;obesity (BMI >30kg/m2);sedentary lifestyle  Objective:    Today's Vitals   09/11/15 0950  BP: 124/72  Pulse: 66  Height: 5\' 3"  (1.6 m)  Weight: 160 lb (72.576 kg)  PainSc: 0-No pain   Body mass index is 28.35 kg/(m^2).  A1c = 6.2 (09/03/2015)   Activities of Daily Living In your present state of health, do you have any difficulty performing the following activities: 09/11/2015 09/03/2015  Hearing? N N  Vision? N N  Difficulty concentrating or making decisions? N N  Walking or climbing stairs? N N  Dressing or bathing? N N  Doing errands, shopping? N -  Preparing Food and eating ? N -  Using the Toilet? N -  In the past six months, have you accidently leaked urine? N -  Do you have problems with loss of bowel control? N -  Managing your Medications? N -  Managing your Finances? N -  Housekeeping or managing your Housekeeping? N -     Depression Screen PHQ 2/9 Scores 09/11/2015 05/29/2015 04/30/2015 03/12/2015  PHQ - 2 Score 0 0 0 0     Fall Risk Fall Risk  09/11/2015 05/29/2015 04/30/2015 03/12/2015 01/31/2015  Falls in the past year? No No No No No    Cognitive Function: MMSE - Mini Mental State Exam 09/11/2015  Orientation to time 5  Orientation to Place 5  Registration 3   Attention/ Calculation 4  Recall 3  Language- name 2 objects 2  Language- repeat 1  Language- follow 3 step command 3  Language- read & follow direction 1  Write a sentence 1  Copy design 0  Total score 28    Immunizations and Health Maintenance Immunization History  Administered Date(s) Administered  . Influenza,inj,Quad PF,36+ Mos 04/12/2013, 02/20/2014, 04/30/2015  . Pneumococcal Conjugate-13 04/12/2013  . Pneumococcal Polysaccharide-23 07/22/2008   There are no preventive care reminders to display for this patient.  Patient Care Team: [name], MD as PCP - General (Family Medicine) Rogene Houston, MD  as Consulting Physician (Gastroenterology) Marybelle Killings, MD as Consulting Physician (Orthopedic Surgery) Fran Lowes, MD as Consulting Physician (Nephrology) Lavonna Monarch, MD as Consulting Physician (Dermatology) Clent Jacks, MD as Consulting Physician (Ophthalmology) Philemon Kingdom, MD as Consulting Physician (Internal Medicine)  Indicate any recent Medical Services you may have received from other than [facility] providers in the past year (date may be approximate).    Assessment:    Annual Wellness Visit  Elevated serum creatinine - patient's orthopedist and nephrologist have both been notified of most recent BMET which showed improved serum creatinine. H/o hypercalcemia - normal when last checked Overweight - BMI of 28   Screening Tests Health Maintenance  Topic Date Due  . OPHTHALMOLOGY EXAM  10/10/2015  . INFLUENZA VACCINE  12/23/2015  . COLONOSCOPY  03/03/2016  . HEMOGLOBIN A1C  03/04/2016  . FOOT EXAM  03/11/2016  . PAP SMEAR  03/12/2016  . DEXA SCAN  10/16/2016  . TETANUS/TDAP  02/21/2021  . ZOSTAVAX  Completed  . PNA vac Low Risk Adult  Completed        Plan:   During the course of the visit Miyuki was educated and counseled about the following appropriate screening and preventive services:   Vaccines to include Pneumoccal, Influenza,   Td, Zostavax - all vaccines are UTD  Colorectal cancer screening - colonoscopy is UTD - patient given FOBT which is due next month  Cardiovascular disease screening - UTD  Diabetes screening - UTD  Bone Denisty / Osteoporosis Screening - UTD  Glaucoma screening / Diabetic Eye Exam - UTD; gets yearly  Nutrition counseling - discussed limiting high CHO foods; increase fruits and vegetable; lean proteins and whole grains.  PAP - UTD - has had hystrectomy  Advanced Directives - patient to look for copy to take to hospital  Physical Activity - to start regular exercise program once clear after surgery on knee and shoulder.  Will do PT after surgery.    Patient Instructions (the written plan) were given to the patient.   Cherre Robins, [facility]   09/11/2015

## 2015-09-11 NOTE — Patient Instructions (Addendum)
  Lisa Sutton , Thank you for taking time to come for your Medicare Wellness Visit. I appreciate your ongoing commitment to your health goals. Please review the following plan we discussed and let me know if I can assist you in the future.    Goals:  Look for copy of Martin (important to know where these are kept - you can also bring copy to our office to be placed in our file / electronic chart)  Once cleared after surgery - try to aim for at least 150 minutes of exercise each week.   Increase non-starchy vegetables - carrots, green bean, squash, zucchini, tomatoes, onions, peppers, spinach and other green leafy vegetables, cabbage, lettuce, cucumbers, asparagus, okra (not fried), eggplant Limit sugar and processed foods (cakes, cookies, ice cream, crackers and chips) Increase fresh fruit but limit serving sizes 1/2 cup or about the size of tennis or baseball Limit red meat to no more than 1-2 times per week (serving size about the size of your palm) Choose whole grains / lean proteins - whole wheat bread, quinoa, whole grain rice (1/2 cup), fish, chicken, [place] Avoid sugar and calorie containing beverages - soda, sweet tea and juice.  Choose water or unsweetened tea instead.     This is a list of the screening recommended for you and due dates:  Health Maintenance  Topic Date Due  . Eye exam for diabetics  10/10/2015  . Flu Shot  12/23/2015  . Colon Cancer Screening  03/03/2016  . Hemoglobin A1C  03/04/2016  . Complete foot exam   03/11/2016  . Pap Smear  03/12/2016  . DEXA scan (bone density measurement)  10/16/2016  . Tetanus Vaccine  02/21/2021  . Shingles Vaccine  Completed  . Pneumonia vaccines  Completed

## 2015-09-12 NOTE — Progress Notes (Signed)
Left msg. On home & mobile numbers to arrive at 0900 on 09/15/2015.

## 2015-09-14 MED ORDER — CEFAZOLIN SODIUM-DEXTROSE 2-4 GM/100ML-% IV SOLN: 2.0000 g | INTRAVENOUS | Status: DC

## 2015-09-15 ENCOUNTER — Inpatient Hospital Stay (HOSPITAL_COMMUNITY): Payer: Medicare Other | Admitting: Anesthesiology

## 2015-09-15 ENCOUNTER — Encounter (HOSPITAL_COMMUNITY): Payer: Self-pay | Admitting: Anesthesiology

## 2015-09-15 ENCOUNTER — Inpatient Hospital Stay (HOSPITAL_COMMUNITY): Payer: Medicare Other

## 2015-09-15 ENCOUNTER — Encounter (HOSPITAL_COMMUNITY)
Admission: RE | Disposition: A | Discharge status: Home Health | Payer: Self-pay | Source: Ambulatory Visit | Attending: Orthopaedic Surgery

## 2015-09-15 ENCOUNTER — Inpatient Hospital Stay (HOSPITAL_COMMUNITY): Payer: Medicare Other | Admitting: Emergency Medicine

## 2015-09-15 ENCOUNTER — Inpatient Hospital Stay (HOSPITAL_COMMUNITY)
Admission: RE | Admit: 2015-09-15 | Discharge: 2015-09-18 | DRG: 470 | Disposition: A | Discharge status: Home Health | Payer: Medicare Other | Source: Ambulatory Visit | Attending: Orthopaedic Surgery | Admitting: Orthopaedic Surgery

## 2015-09-15 DIAGNOSIS — I10 Essential (primary) hypertension: Secondary | ICD-10-CM | POA: Diagnosis not present

## 2015-09-15 DIAGNOSIS — E039 Hypothyroidism, unspecified: Secondary | ICD-10-CM | POA: Diagnosis not present

## 2015-09-15 DIAGNOSIS — Z833 Family history of diabetes mellitus: Secondary | ICD-10-CM | POA: Diagnosis not present

## 2015-09-15 DIAGNOSIS — Z09 Encounter for follow-up examination after completed treatment for conditions other than malignant neoplasm: Secondary | ICD-10-CM

## 2015-09-15 DIAGNOSIS — Z6828 Body mass index (BMI) 28.0-28.9, adult: Secondary | ICD-10-CM | POA: Diagnosis not present

## 2015-09-15 DIAGNOSIS — K760 Fatty (change of) liver, not elsewhere classified: Secondary | ICD-10-CM | POA: Diagnosis not present

## 2015-09-15 DIAGNOSIS — E1142 Type 2 diabetes mellitus with diabetic polyneuropathy: Secondary | ICD-10-CM | POA: Diagnosis present

## 2015-09-15 DIAGNOSIS — B1910 Unspecified viral hepatitis B without hepatic coma: Secondary | ICD-10-CM | POA: Diagnosis present

## 2015-09-15 DIAGNOSIS — Z96652 Presence of left artificial knee joint: Secondary | ICD-10-CM

## 2015-09-15 DIAGNOSIS — G8918 Other acute postprocedural pain: Secondary | ICD-10-CM | POA: Diagnosis not present

## 2015-09-15 DIAGNOSIS — Z8249 Family history of ischemic heart disease and other diseases of the circulatory system: Secondary | ICD-10-CM

## 2015-09-15 DIAGNOSIS — K219 Gastro-esophageal reflux disease without esophagitis: Secondary | ICD-10-CM | POA: Diagnosis not present

## 2015-09-15 DIAGNOSIS — Z888 Allergy status to other drugs, medicaments and biological substances status: Secondary | ICD-10-CM

## 2015-09-15 DIAGNOSIS — R269 Unspecified abnormalities of gait and mobility: Secondary | ICD-10-CM | POA: Diagnosis not present

## 2015-09-15 DIAGNOSIS — M179 Osteoarthritis of knee, unspecified: Secondary | ICD-10-CM | POA: Diagnosis not present

## 2015-09-15 DIAGNOSIS — E785 Hyperlipidemia, unspecified: Secondary | ICD-10-CM | POA: Diagnosis not present

## 2015-09-15 DIAGNOSIS — M1712 Unilateral primary osteoarthritis, left knee: Principal | ICD-10-CM | POA: Diagnosis present

## 2015-09-15 DIAGNOSIS — E669 Obesity, unspecified: Secondary | ICD-10-CM | POA: Diagnosis present

## 2015-09-15 DIAGNOSIS — Z471 Aftercare following joint replacement surgery: Secondary | ICD-10-CM | POA: Diagnosis not present

## 2015-09-15 DIAGNOSIS — M25562 Pain in left knee: Secondary | ICD-10-CM | POA: Diagnosis present

## 2015-09-15 HISTORY — PX: KNEE ARTHROPLASTY: SHX992

## 2015-09-15 LAB — GLUCOSE, CAPILLARY
GLUCOSE-CAPILLARY: 107 mg/dL — AB (ref 65–99)
GLUCOSE-CAPILLARY: 112 mg/dL — AB (ref 65–99)
GLUCOSE-CAPILLARY: 144 mg/dL — AB (ref 65–99)
Glucose-Capillary: 107 mg/dL — ABNORMAL HIGH (ref 65–99)

## 2015-09-15 SURGERY — ARTHROPLASTY, KNEE, TOTAL, USING IMAGELESS COMPUTER-ASSISTED NAVIGATION
Anesthesia: General | Site: Knee | Laterality: Left

## 2015-09-15 MED ORDER — ROCURONIUM BROMIDE 50 MG/5ML IV SOLN
INTRAVENOUS | Status: AC
  Filled 2015-09-15: qty 1

## 2015-09-15 MED ORDER — ASPIRIN EC 325 MG PO TBEC
325.0000 mg | DELAYED_RELEASE_TABLET | Freq: Every day | ORAL | Status: DC
  Administered 2015-09-16 – 2015-09-18 (×3): 325 mg via ORAL
  Filled 2015-09-15 (×3): qty 1

## 2015-09-15 MED ORDER — ACETAMINOPHEN 650 MG RE SUPP: 650.0000 mg | Freq: Four times a day (QID) | RECTAL | Status: DC | PRN

## 2015-09-15 MED ORDER — ACETAMINOPHEN 325 MG PO TABS
650.0000 mg | ORAL_TABLET | Freq: Four times a day (QID) | ORAL | Status: DC | PRN
  Administered 2015-09-16: 650 mg via ORAL
  Filled 2015-09-15: qty 2

## 2015-09-15 MED ORDER — METOCLOPRAMIDE HCL 5 MG/ML IJ SOLN
5.0000 mg | Freq: Three times a day (TID) | INTRAMUSCULAR | Status: DC | PRN
  Administered 2015-09-15: 10 mg via INTRAVENOUS
  Filled 2015-09-15: qty 2

## 2015-09-15 MED ORDER — METHOCARBAMOL 1000 MG/10ML IJ SOLN
500.0000 mg | Freq: Four times a day (QID) | INTRAVENOUS | Status: DC | PRN
  Administered 2015-09-16: 500 mg via INTRAVENOUS
  Filled 2015-09-15 (×3): qty 5

## 2015-09-15 MED ORDER — BUPIVACAINE HCL (PF) 0.5 % IJ SOLN
INTRAMUSCULAR | Status: DC | PRN
  Administered 2015-09-15: 30 mL via PERINEURAL

## 2015-09-15 MED ORDER — MENTHOL 3 MG MT LOZG: 1.0000 | LOZENGE | OROMUCOSAL | Status: DC | PRN

## 2015-09-15 MED ORDER — HYDROMORPHONE HCL 1 MG/ML IJ SOLN
0.5000 mg | INTRAMUSCULAR | Status: DC | PRN
  Administered 2015-09-15 – 2015-09-16 (×3): 0.5 mg via INTRAVENOUS
  Filled 2015-09-15 (×3): qty 1

## 2015-09-15 MED ORDER — SUGAMMADEX SODIUM 200 MG/2ML IV SOLN
INTRAVENOUS | Status: AC
  Filled 2015-09-15: qty 2

## 2015-09-15 MED ORDER — DOCUSATE SODIUM 100 MG PO CAPS
100.0000 mg | ORAL_CAPSULE | Freq: Two times a day (BID) | ORAL | Status: DC
  Administered 2015-09-15 – 2015-09-18 (×6): 100 mg via ORAL
  Filled 2015-09-15 (×6): qty 1

## 2015-09-15 MED ORDER — BUPIVACAINE HCL (PF) 0.25 % IJ SOLN
INTRAMUSCULAR | Status: DC | PRN
  Administered 2015-09-15: 20 mL

## 2015-09-15 MED ORDER — ONDANSETRON HCL 4 MG PO TABS: 4.0000 mg | ORAL_TABLET | Freq: Four times a day (QID) | ORAL | Status: DC | PRN

## 2015-09-15 MED ORDER — PANTOPRAZOLE SODIUM 40 MG PO TBEC
40.0000 mg | DELAYED_RELEASE_TABLET | Freq: Every day | ORAL | Status: DC
  Administered 2015-09-16 – 2015-09-18 (×3): 40 mg via ORAL
  Filled 2015-09-15 (×3): qty 1

## 2015-09-15 MED ORDER — ONDANSETRON HCL 4 MG/2ML IJ SOLN
INTRAMUSCULAR | Status: AC
  Filled 2015-09-15: qty 2

## 2015-09-15 MED ORDER — PROPOFOL 10 MG/ML IV BOLUS
INTRAVENOUS | Status: AC
  Filled 2015-09-15: qty 20

## 2015-09-15 MED ORDER — HYDROMORPHONE HCL 1 MG/ML IJ SOLN
INTRAMUSCULAR | Status: AC
  Administered 2015-09-15: 0.5 mg via INTRAVENOUS
  Filled 2015-09-15: qty 1

## 2015-09-15 MED ORDER — ALPRAZOLAM 0.25 MG PO TABS
0.2500 mg | ORAL_TABLET | Freq: Two times a day (BID) | ORAL | Status: DC | PRN
  Administered 2015-09-17: 0.25 mg via ORAL
  Filled 2015-09-15: qty 1

## 2015-09-15 MED ORDER — LEVOTHYROXINE SODIUM 112 MCG PO TABS
112.0000 ug | ORAL_TABLET | Freq: Every day | ORAL | Status: DC
  Administered 2015-09-16 – 2015-09-18 (×3): 112 ug via ORAL
  Filled 2015-09-15 (×3): qty 1

## 2015-09-15 MED ORDER — FENTANYL CITRATE (PF) 100 MCG/2ML IJ SOLN
INTRAMUSCULAR | Status: AC
Start: 2015-09-15 — End: 2015-09-15
  Administered 2015-09-15: 50 ug
  Filled 2015-09-15: qty 2

## 2015-09-15 MED ORDER — HYDROMORPHONE HCL 1 MG/ML IJ SOLN
INTRAMUSCULAR | Status: AC
  Filled 2015-09-15: qty 1

## 2015-09-15 MED ORDER — LACTATED RINGERS IV SOLN
INTRAVENOUS | Status: DC | PRN
  Administered 2015-09-15: 12:00:00 via INTRAVENOUS

## 2015-09-15 MED ORDER — 0.9 % SODIUM CHLORIDE (POUR BTL) OPTIME
TOPICAL | Status: DC | PRN
  Administered 2015-09-15: 1000 mL

## 2015-09-15 MED ORDER — SODIUM CHLORIDE 0.9 % IV SOLN
INTRAVENOUS | Status: DC
  Administered 2015-09-15: 11:00:00 via INTRAVENOUS
  Administered 2015-09-15: 10 mL/h via INTRAVENOUS

## 2015-09-15 MED ORDER — FENTANYL CITRATE (PF) 100 MCG/2ML IJ SOLN
INTRAMUSCULAR | Status: DC | PRN
  Administered 2015-09-15 (×3): 50 ug via INTRAVENOUS
  Administered 2015-09-15: 100 ug via INTRAVENOUS

## 2015-09-15 MED ORDER — METHOCARBAMOL 500 MG PO TABS
500.0000 mg | ORAL_TABLET | Freq: Four times a day (QID) | ORAL | Status: DC | PRN
  Administered 2015-09-16 – 2015-09-18 (×5): 500 mg via ORAL
  Filled 2015-09-15 (×5): qty 1

## 2015-09-15 MED ORDER — MIDAZOLAM HCL 2 MG/2ML IJ SOLN
INTRAMUSCULAR | Status: AC
Start: 2015-09-15 — End: 2015-09-15
  Administered 2015-09-15: 1 mg
  Filled 2015-09-15: qty 2

## 2015-09-15 MED ORDER — HYDROMORPHONE HCL 1 MG/ML IJ SOLN
0.2500 mg | INTRAMUSCULAR | Status: DC | PRN
  Administered 2015-09-15 (×3): 0.5 mg via INTRAVENOUS

## 2015-09-15 MED ORDER — LIDOCAINE HCL (CARDIAC) 20 MG/ML IV SOLN
INTRAVENOUS | Status: AC
  Filled 2015-09-15: qty 5

## 2015-09-15 MED ORDER — PHENOL 1.4 % MT LIQD
1.0000 | OROMUCOSAL | Status: DC | PRN
Start: 2015-09-15 — End: 2015-09-18

## 2015-09-15 MED ORDER — OXYCODONE HCL 5 MG PO TABS
5.0000 mg | ORAL_TABLET | ORAL | Status: DC | PRN
  Administered 2015-09-16 (×2): 5 mg via ORAL
  Filled 2015-09-15 (×2): qty 1

## 2015-09-15 MED ORDER — SODIUM CHLORIDE 0.9 % IR SOLN
Status: DC | PRN
  Administered 2015-09-15: 3000 mL

## 2015-09-15 MED ORDER — LISINOPRIL 20 MG PO TABS
20.0000 mg | ORAL_TABLET | Freq: Every day | ORAL | Status: DC
  Administered 2015-09-15 – 2015-09-18 (×4): 20 mg via ORAL
  Filled 2015-09-15 (×4): qty 1

## 2015-09-15 MED ORDER — CEFAZOLIN SODIUM-DEXTROSE 2-4 GM/100ML-% IV SOLN
INTRAVENOUS | Status: AC
  Administered 2015-09-15: 2 g via INTRAVENOUS
  Filled 2015-09-15: qty 100

## 2015-09-15 MED ORDER — CEFAZOLIN SODIUM 1-5 GM-% IV SOLN
1.0000 g | Freq: Four times a day (QID) | INTRAVENOUS | Status: AC
  Administered 2015-09-15 (×2): 1 g via INTRAVENOUS
  Filled 2015-09-15 (×2): qty 50

## 2015-09-15 MED ORDER — PROPOFOL 10 MG/ML IV BOLUS
INTRAVENOUS | Status: DC | PRN
  Administered 2015-09-15: 130 mg via INTRAVENOUS

## 2015-09-15 MED ORDER — FUROSEMIDE 40 MG PO TABS
40.0000 mg | ORAL_TABLET | Freq: Every day | ORAL | Status: DC
  Administered 2015-09-16 – 2015-09-18 (×2): 40 mg via ORAL
  Filled 2015-09-15 (×3): qty 1

## 2015-09-15 MED ORDER — CHLORHEXIDINE GLUCONATE 4 % EX LIQD: 60.0000 mL | Freq: Once | CUTANEOUS | Status: DC

## 2015-09-15 MED ORDER — AMLODIPINE BESYLATE 2.5 MG PO TABS
2.5000 mg | ORAL_TABLET | Freq: Every day | ORAL | Status: DC
  Administered 2015-09-16 – 2015-09-18 (×3): 2.5 mg via ORAL
  Filled 2015-09-15 (×3): qty 1

## 2015-09-15 MED ORDER — ONDANSETRON HCL 4 MG/2ML IJ SOLN
4.0000 mg | Freq: Four times a day (QID) | INTRAMUSCULAR | Status: DC | PRN
  Administered 2015-09-15 – 2015-09-16 (×3): 4 mg via INTRAVENOUS
  Filled 2015-09-15 (×2): qty 2

## 2015-09-15 MED ORDER — ATENOLOL 50 MG PO TABS
50.0000 mg | ORAL_TABLET | Freq: Every day | ORAL | Status: DC
  Administered 2015-09-16 – 2015-09-18 (×3): 50 mg via ORAL
  Filled 2015-09-15 (×3): qty 1

## 2015-09-15 MED ORDER — METFORMIN HCL 500 MG PO TABS
500.0000 mg | ORAL_TABLET | Freq: Two times a day (BID) | ORAL | Status: DC
  Administered 2015-09-16 – 2015-09-18 (×6): 500 mg via ORAL
  Filled 2015-09-15 (×6): qty 1

## 2015-09-15 MED ORDER — POLYETHYLENE GLYCOL 3350 17 G PO PACK
17.0000 g | PACK | Freq: Every day | ORAL | Status: DC | PRN
  Administered 2015-09-17 – 2015-09-18 (×2): 17 g via ORAL
  Filled 2015-09-15: qty 1

## 2015-09-15 MED ORDER — SCOPOLAMINE 1 MG/3DAYS TD PT72
MEDICATED_PATCH | TRANSDERMAL | Status: DC | PRN
  Administered 2015-09-15: 1 via TRANSDERMAL

## 2015-09-15 MED ORDER — METOCLOPRAMIDE HCL 5 MG PO TABS
5.0000 mg | ORAL_TABLET | Freq: Three times a day (TID) | ORAL | Status: DC | PRN
  Administered 2015-09-18: 10 mg via ORAL
  Filled 2015-09-15: qty 2

## 2015-09-15 MED ORDER — LEVOTHYROXINE SODIUM 25 MCG PO TABS
25.0000 ug | ORAL_TABLET | ORAL | Status: DC
  Administered 2015-09-17: 25 ug via ORAL
  Filled 2015-09-15: qty 1

## 2015-09-15 MED ORDER — BUPIVACAINE HCL (PF) 0.25 % IJ SOLN
INTRAMUSCULAR | Status: AC
  Filled 2015-09-15: qty 30

## 2015-09-15 MED ORDER — SUGAMMADEX SODIUM 200 MG/2ML IV SOLN
INTRAVENOUS | Status: DC | PRN
  Administered 2015-09-15: 150 mg via INTRAVENOUS

## 2015-09-15 MED ORDER — FENTANYL CITRATE (PF) 250 MCG/5ML IJ SOLN
INTRAMUSCULAR | Status: AC
  Filled 2015-09-15: qty 5

## 2015-09-15 MED ORDER — BUPIVACAINE LIPOSOME 1.3 % IJ SUSP
INTRAMUSCULAR | Status: DC | PRN
  Administered 2015-09-15: 20 mL

## 2015-09-15 MED ORDER — BUPIVACAINE LIPOSOME 1.3 % IJ SUSP
20.0000 mL | Freq: Once | INTRAMUSCULAR | Status: DC
  Filled 2015-09-15: qty 20

## 2015-09-15 MED ORDER — ONDANSETRON HCL 4 MG/2ML IJ SOLN
INTRAMUSCULAR | Status: DC | PRN
  Administered 2015-09-15: 4 mg via INTRAVENOUS

## 2015-09-15 MED ORDER — SODIUM CHLORIDE 0.45 % IV SOLN
INTRAVENOUS | Status: DC
  Administered 2015-09-15: via INTRAVENOUS

## 2015-09-15 MED ORDER — LIDOCAINE HCL (CARDIAC) 20 MG/ML IV SOLN
INTRAVENOUS | Status: DC | PRN
  Administered 2015-09-15: 60 mg via INTRAVENOUS

## 2015-09-15 MED ORDER — SCOPOLAMINE 1 MG/3DAYS TD PT72
MEDICATED_PATCH | TRANSDERMAL | Status: AC
  Filled 2015-09-15: qty 1

## 2015-09-15 MED ORDER — ROCURONIUM BROMIDE 100 MG/10ML IV SOLN
INTRAVENOUS | Status: DC | PRN
  Administered 2015-09-15: 35 mg via INTRAVENOUS

## 2015-09-15 MED ORDER — SODIUM CHLORIDE 0.9 % IJ SOLN
INTRAMUSCULAR | Status: DC | PRN
  Administered 2015-09-15: 20 mL

## 2015-09-15 MED ORDER — PHENYLEPHRINE HCL 10 MG/ML IJ SOLN
INTRAMUSCULAR | Status: DC | PRN
  Administered 2015-09-15: 40 ug via INTRAVENOUS
  Administered 2015-09-15: 80 ug via INTRAVENOUS

## 2015-09-15 SURGICAL SUPPLY — 66 items
BANDAGE ELASTIC 4 VELCRO ST LF (GAUZE/BANDAGES/DRESSINGS) ×2 IMPLANT
BANDAGE ESMARK 6X9 LF (GAUZE/BANDAGES/DRESSINGS) ×1 IMPLANT
BENZOIN TINCTURE PRP APPL 2/3 (GAUZE/BANDAGES/DRESSINGS) ×4 IMPLANT
BLADE SAGITTAL 25.0X1.19X90 (BLADE) ×2 IMPLANT
BLADE SAW SGTL 13X75X1.27 (BLADE) ×2 IMPLANT
BNDG CMPR 9X6 STRL LF SNTH (GAUZE/BANDAGES/DRESSINGS) ×1
BNDG CMPR MED 10X6 ELC LF (GAUZE/BANDAGES/DRESSINGS) ×1
BNDG ELASTIC 6X10 VLCR STRL LF (GAUZE/BANDAGES/DRESSINGS) ×2 IMPLANT
BNDG ESMARK 6X9 LF (GAUZE/BANDAGES/DRESSINGS) ×2
BOWL SMART MIX CTS (DISPOSABLE) ×2 IMPLANT
CAP KNEE TOTAL 3 SIGMA ×2 IMPLANT
CEMENT HV SMART SET (Cement) ×4 IMPLANT
CLSR STERI-STRIP ANTIMIC 1/2X4 (GAUZE/BANDAGES/DRESSINGS) ×4 IMPLANT
COVER SURGICAL LIGHT HANDLE (MISCELLANEOUS) ×2 IMPLANT
CUFF TOURNIQUET SINGLE 34IN LL (TOURNIQUET CUFF) ×2 IMPLANT
CUFF TOURNIQUET SINGLE 44IN (TOURNIQUET CUFF) IMPLANT
DRAPE ORTHO SPLIT 77X108 STRL (DRAPES) ×4
DRAPE SURG ORHT 6 SPLT 77X108 (DRAPES) ×2 IMPLANT
DRAPE U-SHAPE 47X51 STRL (DRAPES) ×2 IMPLANT
DRSG PAD ABDOMINAL 8X10 ST (GAUZE/BANDAGES/DRESSINGS) ×4 IMPLANT
DURAPREP 26ML APPLICATOR (WOUND CARE) ×2 IMPLANT
ELECT REM PT RETURN 9FT ADLT (ELECTROSURGICAL) ×2
ELECTRODE REM PT RTRN 9FT ADLT (ELECTROSURGICAL) ×1 IMPLANT
FACESHIELD WRAPAROUND (MASK) ×4 IMPLANT
GAUZE SPONGE 4X4 12PLY STRL (GAUZE/BANDAGES/DRESSINGS) ×2 IMPLANT
GAUZE XEROFORM 5X9 LF (GAUZE/BANDAGES/DRESSINGS) ×2 IMPLANT
GLOVE BIOGEL PI IND STRL 8 (GLOVE) ×2 IMPLANT
GLOVE BIOGEL PI INDICATOR 8 (GLOVE) ×2
GLOVE ORTHO TXT STRL SZ7.5 (GLOVE) ×4 IMPLANT
GOWN STRL REUS W/ TWL LRG LVL3 (GOWN DISPOSABLE) ×1 IMPLANT
GOWN STRL REUS W/ TWL XL LVL3 (GOWN DISPOSABLE) ×1 IMPLANT
GOWN STRL REUS W/TWL 2XL LVL3 (GOWN DISPOSABLE) ×2 IMPLANT
GOWN STRL REUS W/TWL LRG LVL3 (GOWN DISPOSABLE) ×1
GOWN STRL REUS W/TWL XL LVL3 (GOWN DISPOSABLE) ×1
HANDPIECE INTERPULSE COAX TIP (DISPOSABLE) ×2
IMMOBILIZER KNEE 22 (SOFTGOODS) ×2 IMPLANT
KIT BASIN OR (CUSTOM PROCEDURE TRAY) ×2 IMPLANT
KIT ROOM TURNOVER OR (KITS) ×2 IMPLANT
MANIFOLD NEPTUNE II (INSTRUMENTS) ×2 IMPLANT
MARKER SPHERE PSV REFLC THRD 5 (MARKER) ×8 IMPLANT
NEEDLE HYPO 25GX1X1/2 BEV (NEEDLE) ×2 IMPLANT
NS IRRIG 1000ML POUR BTL (IV SOLUTION) ×2 IMPLANT
PACK TOTAL JOINT (CUSTOM PROCEDURE TRAY) ×2 IMPLANT
PACK UNIVERSAL I (CUSTOM PROCEDURE TRAY) IMPLANT
PAD ARMBOARD 7.5X6 YLW CONV (MISCELLANEOUS) ×4 IMPLANT
PAD CAST 4YDX4 CTTN HI CHSV (CAST SUPPLIES) IMPLANT
PADDING CAST COTTON 4X4 STRL (CAST SUPPLIES)
PADDING CAST COTTON 6X4 STRL (CAST SUPPLIES) ×4 IMPLANT
PIN SCHANZ 4MM 130MM (PIN) ×8 IMPLANT
SET HNDPC FAN SPRY TIP SCT (DISPOSABLE) ×1 IMPLANT
STAPLER VISISTAT 35W (STAPLE) IMPLANT
SUCTION FRAZIER HANDLE 10FR (MISCELLANEOUS) ×1
SUCTION TUBE FRAZIER 10FR DISP (MISCELLANEOUS) ×1 IMPLANT
SUT VIC AB 0 CT1 27 (SUTURE) ×2
SUT VIC AB 0 CT1 27XBRD ANBCTR (SUTURE) ×1 IMPLANT
SUT VIC AB 1 CTX 36 (SUTURE) ×4
SUT VIC AB 1 CTX36XBRD ANBCTR (SUTURE) ×2 IMPLANT
SUT VIC AB 2-0 CT1 27 (SUTURE) ×6
SUT VIC AB 2-0 CT1 TAPERPNT 27 (SUTURE) ×3 IMPLANT
SUT VIC AB 3-0 X1 27 (SUTURE) ×2 IMPLANT
SYR CONTROL 10ML LL (SYRINGE) ×2 IMPLANT
TOWEL OR 17X24 6PK STRL BLUE (TOWEL DISPOSABLE) ×2 IMPLANT
TOWEL OR 17X26 10 PK STRL BLUE (TOWEL DISPOSABLE) ×2 IMPLANT
TUBE CONNECTING 12X1/4 (SUCTIONS) ×2 IMPLANT
WATER STERILE IRR 1000ML POUR (IV SOLUTION) ×2 IMPLANT
YANKAUER SUCT BULB TIP NO VENT (SUCTIONS) ×2 IMPLANT

## 2015-09-15 NOTE — Interval H&P Note (Signed)
History and Physical Interval Note:  09/15/2015 11:03 AM  Lisa Sutton  has presented today for surgery, with the diagnosis of Left Knee Osteoarthritis  The various methods of treatment have been discussed with the patient and family. After consideration of risks, benefits and other options for treatment, the patient has consented to  Procedure(s): COMPUTER ASSISTED TOTAL KNEE ARTHROPLASTY (Left) as a surgical intervention .  The patient's history has been reviewed, patient examined, no change in status, stable for surgery.  I have reviewed the patient's chart and labs.  Questions were answered to the patient's satisfaction.     [name] C

## 2015-09-15 NOTE — Transfer of Care (Signed)
Immediate Anesthesia Transfer of Care Note  Patient: Lisa Sutton  Procedure(s) Performed: Procedure(s): COMPUTER ASSISTED TOTAL KNEE ARTHROPLASTY (Left)  Patient Location: PACU  Anesthesia Type:General and GA combined with regional for post-op pain  Level of Consciousness: awake, alert , oriented and patient cooperative  Airway & Oxygen Therapy: Patient Spontanous Breathing  Post-op Assessment: Report given to RN, Post -op Vital signs reviewed and stable and Patient moving all extremities  Post vital signs: Reviewed and stable  Last Vitals:  Filed Vitals:   09/15/15 1055 09/15/15 1100  BP: 162/73 152/82  Pulse: 64 63  Temp:    Resp: 14 14    Complications: No apparent anesthesia complications

## 2015-09-15 NOTE — Progress Notes (Signed)
Orthopedic Tech Progress Note Patient Details:  Dynetta [facility] May 21, 1934 [id number]  CPM Left Knee CPM Left Knee: On Left Knee Flexion (Degrees): 90 Left Knee Extension (Degrees): 0 Additional Comments: trapeze bar patient helper Viewed order from doctor's order list  Hildred Priest 09/15/2015, 4:10 PM

## 2015-09-15 NOTE — Progress Notes (Signed)
Orthopedic Tech Progress Note Patient Details:  Lisa Sutton [facility] 07/23/1933 [id number] Ortho visit took off cpm Patient ID: Lisa Sutton, female   DOB: 03/22/1934, 82 y.o.   MRN: [id number]   [name] 09/15/2015, 7:18 PM

## 2015-09-15 NOTE — Brief Op Note (Addendum)
09/15/2015  2:27 PM  PATIENT:  Lisa Sutton  82 y.o. female  PRE-OPERATIVE DIAGNOSIS:  Left Knee Osteoarthritis  POST-OPERATIVE DIAGNOSIS:  Left Knee Osteoarthritis  PROCEDURE:  Procedure(s): COMPUTER ASSISTED TOTAL KNEE ARTHROPLASTY (Left)  SURGEON:  Surgeon(s) and Role:    * Marybelle Killings, MD - Primary  PHYSICIAN ASSISTANT: Benjiman Core pa-c ANESTHESIA:   general  EBL:  Total I/O In: 1400 [I.V.:1400] Out: 150 [Blood:150]  BLOOD ADMINISTERED:none  DRAINS: none   LOCAL MEDICATIONS USED:  Marcaine/exparel  SPECIMEN:  No Specimen  DISPOSITION OF SPECIMEN:  N/A  COUNTS:  YES  TOURNIQUET:   Total Tourniquet Time Documented: Thigh (Left) - 76 minutes Total: Thigh (Left) - 76 minutes   DICTATION: .Viviann Spare Dictation  PLAN OF CARE: Admit to inpatient   PATIENT DISPOSITION:  PACU - hemodynamically stable.   }

## 2015-09-15 NOTE — Anesthesia Procedure Notes (Addendum)
Anesthesia Regional Block:  Adductor canal block  Pre-Anesthetic Checklist: ,, timeout performed, Correct Patient, Correct Site, Correct Laterality, Correct Procedure, Correct Position, site marked, Risks and benefits discussed,  Surgical consent,  Pre-op evaluation,  At surgeon's request and post-op pain management  Laterality: Left and Lower  Prep: chloraprep       Needles:   Needle Type: Echogenic Stimulator Needle     Needle Length: 9cm 9 cm Needle Gauge: 21 and 21 G  Needle insertion depth: 5 cm   Additional Needles:  Procedures: ultrasound guided (picture in chart) Adductor canal block Narrative:  Start time: 09/15/2015 10:35 AM End time: 09/15/2015 10:50 AM  Performed by: Personally  Anesthesiologist: MASSAGEE, TERRY   Procedure Name: Intubation Date/Time: 09/15/2015 11:37 AM Performed by: Greggory Stallion, Marlissa Emerick L Pre-anesthesia Checklist: Patient identified, Emergency Drugs available, Suction available and Patient being monitored Patient Re-evaluated:Patient Re-evaluated prior to inductionOxygen Delivery Method: Circle System Utilized Preoxygenation: Pre-oxygenation with 100% oxygen Intubation Type: IV induction Ventilation: Mask ventilation without difficulty Laryngoscope Size: Mac and 3 Tube type: Oral Tube size: 7.5 mm Number of attempts: 1 Airway Equipment and Method: Stylet Placement Confirmation: ETT inserted through vocal cords under direct vision,  positive ETCO2 and breath sounds checked- equal and bilateral Secured at: 20 cm Tube secured with: Tape Dental Injury: Teeth and Oropharynx as per pre-operative assessment

## 2015-09-15 NOTE — Op Note (Signed)
NAMEMarland Kitchen  Lisa Sutton, Lisa Sutton             ACCOUNT NO.:  [id number]  MEDICAL RECORD NO.:  [id number]  LOCATION:  [facility]                         FACILITY:  [facility]  PHYSICIAN:  Resa Rinks C. Lorin Mercy, M.D.    DATE OF BIRTH:  10-27-1933  DATE OF PROCEDURE:  09/15/2015 DATE OF DISCHARGE:                              OPERATIVE REPORT   PREOPERATIVE DIAGNOSIS:  Left primary knee osteoarthritis.  POSTOPERATIVE DIAGNOSIS:  Left primary knee osteoarthritis.  PROCEDURE:  Right cemented total knee arthroplasty, computer assist.  SURGEON:  Kerriann Kamphuis C. Lorin Mercy, M.D.  ASSISTANT:  Alyson Locket. Ricard Dillon, PA-C, medically necessary and present for the entire procedure.  ANESTHESIA:  Preoperative block plus Marcaine and Exparel infiltration at closure.  TOURNIQUET TIME:  Less than an hour and a half.  DRAINS:  None.  COMPONENTS:  DePuy rotating platform cemented #3 femur 10 mm #3 rotating platform poly, 2.5 tibia, 35 mm patella, 3 button.  DESCRIPTION OF PROCEDURE:  After induction of general anesthesia, standard prepping and draping, preoperative Ancef prophylaxis.  Lateral post heel bump were used.  DuraPrep, usual extremity sheets, drapes, impervious stockinette, Coban, sterile skin marker marking the midline incision and Betadine and Steri-Drape were applied.  Time-out procedure was completed.  Leg was wrapped in Esmarch, tourniquet inflated. Midline incision was made anteriorly over the knee.  Medial retinacular incision was made.  Patella was flipped, over 10 mm were removed with a saw, drilled with a guide for 35 mm patella.  There were large marginal spurs, tricompartmental degenerative changes worse in the lateral compartment and patellofemoral compartment.  Medial meniscus was intact. There was only grade 3 changes in the medial compartment.  Medial meniscus was resected, ACL and PCL.  The patient had valgus, computer pins were inserted and information was input, tibia and femur.  There was 3-degree flexion  contracture and 10 degrees of valgus, 9 mm taken off the femur, initially 8 was taken off the tibia by pinning; however, computer states that only 6 was taken.  Once the trial components after chamber cuts were made on the femur showed that there was still flexion contracture of 15 degrees and additional 2 mm were taken off the tibia, which allowed near full extension.  The patient was maybe at 2 degrees of reaching extension.  Collateral ligaments were stable.  There was good flexion/extension balance less than a mm.  PCL had been completely resected, posterior spurs removed off the femur particularly low lateral side, which had larger spurs.  Pulse lavage, vacuum mixing of the cement, cementing of the tibia followed by femur, then placement of the poly, and then finally the patellar component.  Cement was removed that was excessive.  The patient's femur size looked like it was between a 3 and 2.5, 2.5 gave excellent fit on the tibia side.  After irrigation, cement was hard at 15 minutes.  Knee had excellent flexion at 20 degrees.  Good stability and with extension would passively reach full extension.  Posterior capsule had been resected.  Repeat irrigation and then infiltration with Marcaine and Exparel 20 + 20. Standard closure of the capsule with nonabsorbable 2-0 Vicryl subcutaneous tissue, subcuticular closure, postop dressing after a tincture of benzoin, Steri-Strips,  and postop knee immobilizer was applied to the patient's adductor block.  The patient tolerated the procedure well, transferred to the recovery room in a stable condition.     Tyffani Foglesong C. Lorin Mercy, M.D.     MCY/MEDQ  D:  09/15/2015  T:  09/15/2015  Job:  [id number]

## 2015-09-15 NOTE — Anesthesia Preprocedure Evaluation (Signed)
Anesthesia Evaluation  Patient identified by MRN, date of birth, ID band Patient awake    History of Anesthesia Complications (+) PONV  Airway Mallampati: II  TM Distance: >3 FB Neck ROM: Full    Dental  (+) Teeth Intact   Pulmonary neg pulmonary ROS,    breath sounds clear to auscultation       Cardiovascular hypertension, + dysrhythmias  Rhythm:Regular Rate:Normal     Neuro/Psych  Neuromuscular disease    GI/Hepatic hiatal hernia, GERD  ,(+) Hepatitis -  Endo/Other  diabetesHypothyroidism   Renal/GU Renal disease     Musculoskeletal  (+) Arthritis ,   Abdominal   Peds  Hematology   Anesthesia Other Findings   Reproductive/Obstetrics                             Anesthesia Physical Anesthesia Plan  ASA: III  Anesthesia Plan: General   Post-op Pain Management: GA combined w/ Regional for post-op pain   Induction: Intravenous  Airway Management Planned: Oral ETT  Additional Equipment:   Intra-op Plan:   Post-operative Plan: Extubation in OR  Informed Consent: I have reviewed the patients History and Physical, chart, labs and discussed the procedure including the risks, benefits and alternatives for the proposed anesthesia with the patient or authorized representative who has indicated his/her understanding and acceptance.   Dental advisory given  Plan Discussed with:   Anesthesia Plan Comments:         Anesthesia Quick Evaluation

## 2015-09-15 NOTE — H&P (Signed)
TOTAL KNEE ADMISSION H&P  Patient is being admitted for left total knee arthroplasty.  Subjective:  Chief Complaint:left knee pain.  HPI: Lisa Sutton, 82 y.o. female, has a history of pain and functional disability in the left knee due to arthritis and has failed non-surgical conservative treatments for greater than 12 weeks to includeNSAID's and/or analgesics, corticosteriod injections, use of assistive devices and activity modification.  Onset of symptoms was gradual, starting 10 years ago with gradually worsening course since that time. left knee(s).  Patient currently rates pain in the left knee(s) at 10 out of 10 with activity. Patient has night pain, worsening of pain with activity and weight bearing, pain that interferes with activities of daily living, pain with passive range of motion and joint swelling.  Patient has evidence of subchondral sclerosis, periarticular osteophytes and joint space narrowing by imaging studies. . There is no active infection.  Patient Active Problem List   Diagnosis Date Noted  . Statin intolerance 09/11/2015  . Peripheral neuropathy ([place]) 07/30/2015  . Abnormal chest x-ray 11/27/2014  . Protein-calorie malnutrition, severe ([place]) 11/27/2014  . AKI (acute kidney injury) ([place]) 11/26/2014  . Hypercalcemia 11/26/2014  . Leukocytosis 11/26/2014  . Weight loss 11/26/2014  . Weakness 11/26/2014  . Insomnia 04/12/2013  . Hyperlipidemia 04/12/2013  . Essential hypertension, benign 10/13/2012  . GERD (gastroesophageal reflux disease) 04/25/2012  . Fatty liver 08/09/2011  . Hepatitis B 08/09/2011  . Dysphagia 08/09/2011  . Type 2 diabetes mellitus ([place]) 08/09/2011  . Hypothyroid 08/09/2011   Past Medical History  Diagnosis Date  . Type 2 diabetes mellitus ([place])   . Essential hypertension, benign   . GERD (gastroesophageal reflux disease)   . Osteoporosis   . Obesity   . Hypothyroidism   . Dysphagia     'sometimes but not a major issue' been  checked out by GI (per pt)  . Fatty liver   . Kidney stone August 2014    Patient was [facility] at [facility]  . PONV (postoperative nausea and vomiting)   . Dysrhythmia     'heart used to skip but doesn't anymore' was checked out by Dr. Ron Parker late '90s, everything checked out ok and not had any skipping since (all per pt)  . Hepatitis B     had at age 51, 'GI doc said it's gone away'  . History of hiatal hernia   . Arthritis   . Cataract   . Hypercalcemia   . Anxiety   . Hepatitis B surface antigen positive     Past Surgical History  Procedure Laterality Date  . Abdominal hysterectomy    . Deviated septum repair    . Knee arthroscopy      left knee 2003  . Cataract extraction, bilateral    . Bilateral carpal tunnel release      Dr. Marlou Sa at surgical center  . Eye surgery      No prescriptions prior to admission   Allergies  Allergen Reactions  . Statins Other (See Comments)    'discomfort, aching everywhere'  . Zetia [Ezetimibe] Other (See Comments)    Leg pain  . Livalo [Pitavastatin] Other (See Comments)    Leg pain  . Zocor [Simvastatin] Other (See Comments)    Leg pain    Social History  Substance Use Topics  . Smoking status: Never Smoker   . Smokeless tobacco: Never Used  . Alcohol Use: No    Family History  Problem Relation Age of Onset  . Hypertension  Father   . Hip fracture Father   . Cancer Maternal Aunt   . Diabetes Maternal Aunt   . Diabetes Maternal Uncle      Review of Systems  Constitutional: Negative.   HENT: Negative.   Eyes: Negative.   Respiratory: Negative.   Cardiovascular: Negative.   Gastrointestinal: Negative.   Genitourinary: Negative.   Musculoskeletal: Positive for joint pain.  Skin: Negative.   Neurological: Negative.   Psychiatric/Behavioral: Negative.     Objective:  Physical Exam  Constitutional: She is oriented to person, place, and time. No distress.  HENT:  Head: Atraumatic.  Eyes: EOM are normal.  Neck: Normal range  of motion.  Cardiovascular: Normal rate.   Respiratory: No respiratory distress.  GI: She exhibits no distension.  Musculoskeletal: She exhibits tenderness.  Neurological: She is alert and oriented to person, place, and time.  Skin: Skin is warm and dry.  Psychiatric: She has a normal mood and affect.    Vital signs in last 24 hours:    Labs:   Estimated body mass index is 28.35 kg/(m^2) as calculated from the following:   Height as of 07/30/15: 5\' 3"  (1.6 m).   Weight as of 07/30/15: 72.576 kg (160 lb).   Imaging Review Plain radiographs demonstrate moderate degenerative joint disease of the left knee(s). The overall alignment ismild varus. The bone quality appears to be good for age and reported activity level.  Assessment/Plan:  End stage arthritis, left knee   The patient history, physical examination, clinical judgment of the provider and imaging studies are consistent with end stage degenerative joint disease of the left knee(s) and total knee arthroplasty is deemed medically necessary. The treatment options including medical management, injection therapy arthroscopy and arthroplasty were discussed at length. The risks and benefits of total knee arthroplasty were presented and reviewed. The risks due to aseptic loosening, infection, stiffness, patella tracking problems, thromboembolic complications and other imponderables were discussed. The patient acknowledged the explanation, agreed to proceed with the plan and consent was signed. Patient is being admitted for inpatient treatment for surgery, pain control, PT, OT, prophylactic antibiotics, VTE prophylaxis, progressive ambulation and ADL's and discharge planning. The patient is planning to be discharged home with home health services

## 2015-09-16 ENCOUNTER — Encounter (HOSPITAL_COMMUNITY): Payer: Self-pay | Admitting: Orthopaedic Surgery

## 2015-09-16 LAB — CBC
HCT: 32.4 % — ABNORMAL LOW (ref 36.0–46.0)
Hemoglobin: 10.5 g/dL — ABNORMAL LOW (ref 12.0–15.0)
MCH: 29 pg (ref 26.0–34.0)
MCHC: 32.4 g/dL (ref 30.0–36.0)
MCV: 89.5 fL (ref 78.0–100.0)
PLATELETS: 256 10*3/uL (ref 150–400)
RBC: 3.62 MIL/uL — ABNORMAL LOW (ref 3.87–5.11)
RDW: 12.9 % (ref 11.5–15.5)
WBC: 9 10*3/uL (ref 4.0–10.5)

## 2015-09-16 LAB — BASIC METABOLIC PANEL
ANION GAP: 11 (ref 5–15)
BUN: 16 mg/dL (ref 6–20)
CHLORIDE: 101 mmol/L (ref 101–111)
CO2: 23 mmol/L (ref 22–32)
CREATININE: 1.28 mg/dL — AB (ref 0.44–1.00)
Calcium: 8.6 mg/dL — ABNORMAL LOW (ref 8.9–10.3)
GFR calc Af Amer: 45 mL/min — ABNORMAL LOW (ref >60)
GFR calc non Af Amer: 39 mL/min — ABNORMAL LOW (ref >60)
GLUCOSE: 135 mg/dL — AB (ref 65–99)
Potassium: 4 mmol/L (ref 3.5–5.1)
Sodium: 135 mmol/L (ref 135–145)

## 2015-09-16 LAB — GLUCOSE, CAPILLARY: GLUCOSE-CAPILLARY: 148 mg/dL — AB (ref 65–99)

## 2015-09-16 MED ORDER — OXYCODONE-ACETAMINOPHEN 5-325 MG PO TABS
1.0000 | ORAL_TABLET | ORAL | Status: DC | PRN
  Administered 2015-09-16: 2 via ORAL
  Administered 2015-09-16 (×2): 1 via ORAL
  Administered 2015-09-17 (×2): 2 via ORAL
  Administered 2015-09-18 (×2): 1 via ORAL
  Filled 2015-09-16: qty 1
  Filled 2015-09-16 (×3): qty 2
  Filled 2015-09-16 (×2): qty 1

## 2015-09-16 NOTE — Care Management Note (Signed)
Case Management Note  Patient Details  Name: Lisa Sutton MRN: [id number] Date of Birth: 04/05/1934  Subjective/Objective:       S/p left total knee arthroplasty             Action/Plan: Spoke to patient about discharge plan. She selected [facility] from [facility] list. Alfonzo Beers with [facility] and set up [place]. Patient stated that her son lives with her and will be assisting her after discharge.Contacted James with [facility] and requested rolling walker be delivered to patient's room. Patient declined 3N1, has handicap equipped bathroom.   Expected Discharge Date:                  Expected Discharge Plan:  [place]  In-House Referral:  NA  Discharge planning Services  CM Consult  Post Acute Care Choice:  Durable Medical Equipment, Home Health Choice offered to:  Patient  DME Arranged:  Walker rolling DME Agency:  [facility] Arranged:  PT HH Agency:  [facility]  Status of Service:  Completed, signed off  Medicare Important Message Given:    Date Medicare IM Given:    Medicare IM give by:    Date Additional Medicare IM Given:    Additional Medicare Important Message give by:     If discussed at [place] of Stay Meetings, dates discussed:    Additional Comments:  Nila Nephew, RN 09/16/2015, 3:10 PM

## 2015-09-16 NOTE — Progress Notes (Signed)
Physical Therapy Treatment Patient Details Name: Lisa Sutton MRN: [id number] DOB: 27-Dec-1933 Today's Date: 09/16/2015    History of Present Illness Pt is a 82 y.o. female now s/p Lt TKA. PMH: diabetes, hypertension, osteoporosis, obesitiy, hepatitis B, anxiety.     PT Comments    Pt able to improve her ambulation distance to 50 feet but continuing to be hesitant to bear weight through the Lt LE. Pt denies pain as limiting factor, states that she is just nervous. Noted poor safety awareness when attempting to get to [facility], reaching for commode and leaving walker behind. Assist needed to maintain stability. Will continue to follow and progress mobility as tolerated in anticipation of D/C to home with family and friends for support.   Follow Up Recommendations  Home health PT;Supervision for mobility/OOB     Equipment Recommendations  Rolling walker with 5" wheels    Recommendations for Other Services       Precautions / Restrictions Precautions Precautions: Knee Required Braces or Orthoses: Knee Immobilizer - Left Knee Immobilizer - Left: On at all times;Other (comment) (except with PT and CPM) Restrictions Weight Bearing Restrictions: Yes LLE Weight Bearing: Weight bearing as tolerated    Mobility  Bed Mobility Overal bed mobility: Needs Assistance Bed Mobility: Supine to Sit     Supine to sit: Min assist     General bed mobility comments: Assist provided with LLE and pt using rail  Transfers Overall transfer level: Needs assistance Equipment used: Rolling walker (2 wheeled) Transfers: Sit to/from Stand Sit to Stand: Min guard         General transfer comment: When attempting to transfer to [facility] pt reaching for arms of commode and leaving walker behind. Physical assit needed for pt to maintain her balance and to keep walker from tipping over. Discussed at length regarding safety.   Ambulation/Gait Ambulation/Gait assistance: Min guard Ambulation Distance  (Feet): 50 Feet Assistive device: Rolling walker (2 wheeled) Gait Pattern/deviations: Step-through pattern;Decreased weight shift to left;Trunk flexed Gait velocity: decreased   General Gait Details: Frequent cues needed for pt to bear weight through the LLE during ambulation. Improving strides with increasing confidence.    Stairs            Wheelchair Mobility    Modified Rankin (Stroke Patients Only)       Balance Overall balance assessment: Needs assistance Sitting-balance support: No upper extremity supported Sitting balance-Leahy Scale: Good     Standing balance support: Bilateral upper extremity supported Standing balance-Leahy Scale: Poor Standing balance comment: using rw                    Cognition Arousal/Alertness: Awake/alert Behavior During Therapy: WFL for tasks assessed/performed Overall Cognitive Status: Within Functional Limits for tasks assessed           Safety/Judgement: Decreased awareness of safety     General Comments: Pt did demonstrate poor safety awareness when attempting to hurry to [facility]. Pt leaving walker behind and attempting to reach for [facility].     Exercises Total Joint Exercises Ankle Circles/Pumps: AROM;Both;10 reps Quad Sets: Strengthening;Left;10 reps Heel Slides: AAROM;Left;10 reps (mod assist) Hip ABduction/ADduction: Strengthening;Left;10 reps (mod assist) Straight Leg Raises: Strengthening;Left;10 reps (mod assist) Goniometric ROM: approximatly 50 degrees knee flexion.     General Comments        Pertinent Vitals/Pain Pain Assessment: 0-10 Pain Score: 3  Pain Location: Lt knee Pain Descriptors / Indicators: Sore Pain Intervention(s): Monitored during session;Limited activity within patient's tolerance  Home Living                      Prior Function            PT Goals (current goals can now be found in the care plan section) Acute Rehab PT Goals Patient Stated Goal: get back home PT  Goal Formulation: With patient Time For Goal Achievement: 09/30/15 Potential to Achieve Goals: Good Progress towards PT goals: Progressing toward goals    Frequency  7X/week    PT Plan Current plan remains appropriate    Co-evaluation             End of Session Equipment Utilized During Treatment: Gait belt;Left knee immobilizer Activity Tolerance: Patient tolerated treatment well;Patient limited by fatigue Patient left: in chair;with call bell/phone within reach;with chair alarm set;with nursing/sitter in room     Time: 1337-1406 PT Time Calculation (min) (ACUTE ONLY): 29 min  Charges:  $Gait Training: 8-22 mins $Therapeutic Exercise: 8-22 mins                    G Codes:      Cassell Clement, PT, CSCS Pager [phone] Office [phone]  09/16/2015, 2:57 PM

## 2015-09-16 NOTE — Progress Notes (Signed)
Utilization review completed.  

## 2015-09-16 NOTE — Progress Notes (Signed)
Occupational Therapy Evaluation Patient Details Name: Lisa Sutton MRN: [id number] DOB: 1933-08-28 Today's Date: 09/16/2015    History of Present Illness Pt is a 82 y.o. female now s/p Lt TKA. PMH: diabetes, hypertension, osteoporosis, obesitiy, hepatitis B, anxiety.    Clinical Impression   PTA, pt lived with son and was independent with ADL and mobility. Pt currently requires min A with mobility @ RW level and mod A with ADL. Pt having difficulty shifting weight onto LLE during ambulation. Only able to ambulate @ 15 ft this am. Will follow acutely to maximize safety and independence with ADL and functional mobility for ADL to facilitate D/C home with 24/7 S. Pt states either her son or a "lady" will be with her.     Follow Up Recommendations  No OT follow up;Supervision/Assistance - 24 hour    Equipment Recommendations    none   Recommendations for Other Services       Precautions / Restrictions Precautions Precautions: Knee Precaution Booklet Issued: Yes (comment) Precaution Comments: HEP provided, reviewed knee extension precautions.  Required Braces or Orthoses: Knee Immobilizer - Left Knee Immobilizer - Left: On at all times Restrictions Weight Bearing Restrictions: Yes LLE Weight Bearing: Weight bearing as tolerated      Mobility Bed Mobility Overal bed mobility: Needs Assistance Bed Mobility: Supine to Sit     Supine to sit: Min assist     General bed mobility comments: OOB in chair  Transfers Overall transfer level: Needs assistance Equipment used: Rolling walker (2 wheeled) Transfers: Sit to/from Stand Sit to Stand: Min guard  min A with mobility. Difficulty off loading RLE       General transfer comment: cues to push up from [facility]. no carry over of hand positioning from earlier PT session    Balance Overall balance assessment: Needs assistance Sitting-balance support: No upper extremity supported Sitting balance-Leahy Scale: Good      Standing balance support: Bilateral upper extremity supported Standing balance-Leahy Scale: Fair Standing balance comment: using rw                            ADL Overall ADL's : Needs assistance/impaired     Grooming: Set up;Sitting   Upper Body Bathing: Set up;Sitting   Lower Body Bathing: Moderate assistance;Sit to/from stand   Upper Body Dressing : Set up;Sitting   Lower Body Dressing: Moderate assistance;Sit to/from stand   Toilet Transfer: Minimal assistance;BSC;RW;Stand-pivot   Toileting- Water quality scientist and Hygiene: Min guard       Functional mobility during ADLs: Minimal assistance;Rolling walker;Cueing for safety;Cueing for sequencing General ADL Comments: difficulty with advancing RLE during ambulation. May benefit from AE     Vision     Perception     Praxis      Pertinent Vitals/Pain Pain Assessment: 0-10 Pain Score: 5  Pain Location: L knee Pain Descriptors / Indicators: Sore Pain Intervention(s): Limited activity within patient's tolerance;Ice applied;Repositioned     Hand Dominance     Extremity/Trunk Assessment Upper Extremity Assessment Upper Extremity Assessment: Overall WFL for tasks assessed   Lower Extremity Assessment Lower Extremity Assessment: Defer to PT evaluation LLE Deficits / Details: unable to perform SLR    Cervical / Trunk Assessment Cervical / Trunk Assessment: Normal   Communication Communication Communication: No difficulties   Cognition Arousal/Alertness: Awake/alert Behavior During Therapy: WFL for tasks assessed/performed Overall Cognitive Status: Within Functional Limits for tasks assessed  Safety/Judgement: Decreased awareness of safety     General Comments: unsafe use of RW at times.   General Comments       Exercises       Shoulder Instructions      Home Living Family/patient expects to be discharged to:: Private residence Living Arrangements: Children Available  Help at Discharge: Family;Available 24 hours/day;Friend(s) Type of Home: House Home Access: Stairs to enter [employer] of Steps: 2 Entrance Stairs-Rails: Right;Left Home Layout: One level     Bathroom Shower/Tub: Occupational psychologist: Standard Bathroom Accessibility: Yes How Accessible: Accessible via walker Home Equipment: Cane - single point   Additional Comments: reports that her son lives with her and she has"a lady coming over to help when son is at work"      Prior Functioning/Environment Level of Independence: Independent             OT Diagnosis: Generalized weakness;Acute pain   OT Problem List: Decreased strength;Decreased range of motion;Decreased activity tolerance;Impaired balance (sitting and/or standing);Decreased safety awareness;Decreased knowledge of use of DME or AE;Decreased knowledge of precautions;Obesity;Pain   OT Treatment/Interventions: Self-care/ADL training;DME and/or AE instruction;Therapeutic activities;Patient/family education;Balance training    OT Goals(Current goals can be found in the care plan section) Acute Rehab OT Goals Patient Stated Goal: Be able to get back to being active again OT Goal Formulation: With patient Time For Goal Achievement: 09/23/15 Potential to Achieve Goals: Good  OT Frequency: Min 2X/week   Barriers to D/C:            Co-evaluation              End of Session Equipment Utilized During Treatment: Gait belt;Rolling walker;Left knee immobilizer CPM Left Knee CPM Left Knee: Off Nurse Communication: Mobility status  Activity Tolerance: Patient tolerated treatment well Patient left: in bed;with call bell/phone within reach;with bed alarm set   Time: 1150-1216 OT Time Calculation (min): 26 min Charges:  OT General Charges $OT Visit: 1 Procedure OT Evaluation $OT Eval Moderate Complexity: 1 Procedure OT Treatments $Self Care/Home Management : 8-22 mins G-Codes:     Dewana Ammirati,HILLARY 24-Sep-2015, 3:55 PM   [facility], OTR/L  [phone] 09/24/15

## 2015-09-16 NOTE — Clinical Social Work Note (Signed)
CSW received referral for SNF.  Case discussed with case manager, and plan is to discharge home with home health.  CSW to sign off please re-consult if social work needs arise.  Idabelle Mcpeters R. Dorn Hartshorne, MSW, LCSWA [phone]  

## 2015-09-16 NOTE — Progress Notes (Signed)
Orthopedic Tech Progress Note Patient Details:  [name] [place] 28-Mar-1934 [id number] Ortho visit put on cpm at 1905 Patient ID: Lisa Sutton, female   DOB: 28-Oct-1933, 82 y.o.   MRN: [id number]   [name] 09/16/2015, 7:06 PM

## 2015-09-16 NOTE — Progress Notes (Signed)
Subjective: 1 Day Post-Op Procedure(s) (LRB): COMPUTER ASSISTED TOTAL KNEE ARTHROPLASTY (Left) Patient reports pain as moderate.  N and V last night now better.   Objective: Vital signs in last 24 hours: Temp:  [97.5 F (36.4 C)-99.1 F (37.3 C)] 99.1 F (37.3 C) (04/25 0437) Pulse Rate:  [58-84] 84 (04/25 0437) Resp:  [11-18] 18 (04/25 0437) BP: (150-192)/(67-94) 174/81 mmHg (04/25 0437) SpO2:  [89 %-100 %] 99 % (04/25 0437) Weight:  [72.576 kg (160 lb)] 72.576 kg (160 lb) (04/24 0856)  Intake/Output from previous day: 04/24 0701 - 04/25 0700 In: 1550 [I.V.:1500; IV Piggyback:50] Out: 67 [Urine:500; Blood:150] Intake/Output this shift:     Recent Labs  09/16/15 0414  HGB 10.5*    Recent Labs  09/16/15 0414  WBC 9.0  RBC 3.62*  HCT 32.4*  PLT 256    Recent Labs  09/16/15 0414  NA 135  K 4.0  CL 101  CO2 23  BUN 16  CREATININE 1.28*  GLUCOSE 135*  CALCIUM 8.6*   No results for input(s): LABPT, INR in the last 72 hours.  Neurologically intact  Assessment/Plan: 1 Day Post-Op Procedure(s) (LRB): COMPUTER ASSISTED TOTAL KNEE ARTHROPLASTY (Left) Up with therapy dressing change.   Armine Rizzolo C 09/16/2015, 7:35 AM

## 2015-09-16 NOTE — Evaluation (Signed)
Physical Therapy Evaluation Patient Details Name: Lisa Sutton MRN: [id number] DOB: 08-02-1933 Today's Date: 09/16/2015   History of Present Illness  Pt is a 82 y.o. female now s/p Lt TKA. PMH: diabetes, hypertension, osteoporosis, obesitiy, hepatitis B, anxiety.   Clinical Impression  Pt is s/p TKA resulting in the deficits listed below (see PT Problem List).  Pt will benefit from skilled PT to increase their independence and safety with mobility to allow discharge to home with friends and family assisting. Pt having difficulty with ambulation due to hesitance with bearing weight through LLE.  Will continue to work on this to progress functional ambulation prior to D/C.       Follow Up Recommendations Home health PT;Supervision for mobility/OOB    Equipment Recommendations  Rolling walker with 5" wheels    Recommendations for Other Services       Precautions / Restrictions Precautions Precautions: Knee Precaution Booklet Issued: Yes (comment) Precaution Comments: HEP provided, reviewed knee extension precautions.  Required Braces or Orthoses: Knee Immobilizer - Left Knee Immobilizer - Left: On at all times Restrictions Weight Bearing Restrictions: Yes LLE Weight Bearing: Weight bearing as tolerated      Mobility  Bed Mobility Overal bed mobility: Needs Assistance Bed Mobility: Supine to Sit     Supine to sit: Min assist     General bed mobility comments: Assist provided with LLE and pt using rail  Transfers Overall transfer level: Needs assistance Equipment used: Rolling walker (2 wheeled) Transfers: Sit to/from Stand Sit to Stand: Min guard         General transfer comment: cues needed with transfers for hand position. Having to cue pt to fully turn prior to attempting to sit. Pt performing transfers from bed and BSC.   Ambulation/Gait Ambulation/Gait assistance: Min guard Ambulation Distance (Feet): 20 Feet (10' X1, 10' X1) Assistive device: Rolling  walker (2 wheeled) Gait Pattern/deviations: Step-to pattern;Decreased step length - right;Decreased step length - left;Decreased weight shift to left Gait velocity: very slow   General Gait Details: Repeated encouragement needed for weightshift to LLE. Difficulty with taking Rt LE stride as pt hesitant to load Lt. Pt ambulating to and from the bathroom.   Stairs            Wheelchair Mobility    Modified Rankin (Stroke Patients Only)       Balance Overall balance assessment: Needs assistance Sitting-balance support: No upper extremity supported Sitting balance-Leahy Scale: Good     Standing balance support: Bilateral upper extremity supported Standing balance-Leahy Scale: Poor Standing balance comment: using rw for support                             Pertinent Vitals/Pain Pain Assessment: 0-10 Pain Score: 4  Pain Location: Lt knee Pain Descriptors / Indicators: Sore Pain Intervention(s): Monitored during session;Limited activity within patient's tolerance    Home Living Family/patient expects to be discharged to:: Private residence Living Arrangements: Children Available Help at Discharge: Family;Available 24 hours/day;Friend(s) Type of Home: House Home Access: Stairs to enter Entrance Stairs-Rails: Psychiatric nurse of Steps: 2 Home Layout: One level Home Equipment: Cane - single point Additional Comments: reports that her son lives with her and she has friends to help also if needed.     Prior Function Level of Independence: Independent               Hand Dominance        Extremity/Trunk  Assessment   Upper Extremity Assessment: Defer to OT evaluation           Lower Extremity Assessment: LLE deficits/detail   LLE Deficits / Details: unable to perform SLR      Communication   Communication: No difficulties  Cognition Arousal/Alertness: Awake/alert Behavior During Therapy: WFL for tasks  assessed/performed Overall Cognitive Status: Within Functional Limits for tasks assessed                      General Comments      Exercises        Assessment/Plan    PT Assessment Patient needs continued PT services  PT Diagnosis Difficulty walking;Abnormality of gait   PT Problem List Decreased strength;Decreased range of motion;Decreased activity tolerance;Decreased balance;Decreased mobility;Decreased safety awareness  PT Treatment Interventions DME instruction;Gait training;Stair training;Functional mobility training;Therapeutic activities;Therapeutic exercise;Balance training;Patient/family education   PT Goals (Current goals can be found in the Care Plan section) Acute Rehab PT Goals Patient Stated Goal: Be able to get back to being active again PT Goal Formulation: With patient Time For Goal Achievement: 09/30/15 Potential to Achieve Goals: Good    Frequency 7X/week   Barriers to discharge        Co-evaluation               End of Session Equipment Utilized During Treatment: Gait belt;Left knee immobilizer Activity Tolerance: Patient tolerated treatment well;Patient limited by fatigue Patient left: in chair;with call bell/phone within reach Nurse Communication: Mobility status;Weight bearing status         Time: [id number] PT Time Calculation (min) (ACUTE ONLY): 36 min   Charges:   PT Evaluation $PT Eval Moderate Complexity: 1 Procedure PT Treatments $Gait Training: 8-22 mins   PT G Codes:        Cassell Clement, PT, CSCS Pager [phone] Office [phone]  09/16/2015, 9:27 AM

## 2015-09-17 LAB — BASIC METABOLIC PANEL
ANION GAP: 10 (ref 5–15)
BUN: 18 mg/dL (ref 6–20)
CALCIUM: 8.9 mg/dL (ref 8.9–10.3)
CO2: 26 mmol/L (ref 22–32)
Chloride: 102 mmol/L (ref 101–111)
Creatinine, Ser: 1.32 mg/dL — ABNORMAL HIGH (ref 0.44–1.00)
GFR, EST AFRICAN AMERICAN: 43 mL/min — AB (ref >60)
GFR, EST NON AFRICAN AMERICAN: 37 mL/min — AB (ref >60)
GLUCOSE: 135 mg/dL — AB (ref 65–99)
POTASSIUM: 3.6 mmol/L (ref 3.5–5.1)
SODIUM: 138 mmol/L (ref 135–145)

## 2015-09-17 LAB — CBC
HCT: 31.3 % — ABNORMAL LOW (ref 36.0–46.0)
Hemoglobin: 10.3 g/dL — ABNORMAL LOW (ref 12.0–15.0)
MCH: 29.3 pg (ref 26.0–34.0)
MCHC: 32.9 g/dL (ref 30.0–36.0)
MCV: 88.9 fL (ref 78.0–100.0)
PLATELETS: 237 10*3/uL (ref 150–400)
RBC: 3.52 MIL/uL — AB (ref 3.87–5.11)
RDW: 13 % (ref 11.5–15.5)
WBC: 11 10*3/uL — AB (ref 4.0–10.5)

## 2015-09-17 MED ORDER — GLUCERNA SHAKE PO LIQD
237.0000 mL | Freq: Three times a day (TID) | ORAL | Status: DC
  Administered 2015-09-17 – 2015-09-18 (×2): 237 mL via ORAL

## 2015-09-17 MED ORDER — OXYCODONE-ACETAMINOPHEN 5-325 MG PO TABS: 1.0000 | ORAL_TABLET | Freq: Four times a day (QID) | ORAL | Status: DC | PRN

## 2015-09-17 MED ORDER — METHOCARBAMOL 500 MG PO TABS: 500.0000 mg | ORAL_TABLET | Freq: Four times a day (QID) | ORAL | Status: DC | PRN

## 2015-09-17 MED ORDER — ASPIRIN 325 MG PO TBEC: 325.0000 mg | DELAYED_RELEASE_TABLET | Freq: Every day | ORAL | Status: DC

## 2015-09-17 NOTE — Progress Notes (Signed)
Subjective: Doing ok.  Pain controlled.  C/o some redness right hand.  No pain, itching, burning.     Objective: Vital signs in last 24 hours: Temp:  [98.5 F (36.9 C)] 98.5 F (36.9 C) (04/25 2026) Pulse Rate:  [79-98] 95 (04/26 0826) Resp:  [20] 20 (04/26 0826) BP: (129-141)/(61-89) 134/62 mmHg (04/26 0826) SpO2:  [95 %-99 %] 95 % (04/26 0826)  Intake/Output from previous day: 04/25 0701 - 04/26 0700 In: 460 [P.O.:460] Out: -  Intake/Output this shift:     Recent Labs  09/16/15 0414 09/17/15 0351  HGB 10.5* 10.3*    Recent Labs  09/16/15 0414 09/17/15 0351  WBC 9.0 11.0*  RBC 3.62* 3.52*  HCT 32.4* 31.3*  PLT 256 237    Recent Labs  09/16/15 0414 09/17/15 0351  NA 135 138  K 4.0 3.6  CL 101 102  CO2 23 26  BUN 16 18  CREATININE 1.28* 1.32*  GLUCOSE 135* 135*  CALCIUM 8.6* 8.9   No results for input(s): LABPT, INR in the last 72 hours.  Exam:  Alert and oriented.  Very pleasant.  Some erythema right hand.  Changes with elevation.  Hand nontender.  No signs of infection or increased warmth.  Knee wound looks good.  No drainage or signs of infection.  Calf nontender, nvi.    Assessment/Plan: Will see how patient does with PT today.  Likely d/c home tomorrow.     Ashna Dorough M 09/17/2015, 8:40 AM

## 2015-09-17 NOTE — Progress Notes (Signed)
Physical Therapy Treatment Patient Details Name: Lisa Sutton MRN: [id number] DOB: Aug 11, 1933 Today's Date: 09/17/2015    History of Present Illness Pt is a 82 y.o. female now s/p Lt TKA. PMH: diabetes, hypertension, osteoporosis, obesitiy, hepatitis B, anxiety.     PT Comments    Pt making progress but slower than anticipated with ambulation. Improved safety during afternoon session and improved stability with ambulation. Pt able to load LLE to allow increased Rt LE swing. Unable to consistently perform swing-through pattern but still improved. Reinforcing safety with all transfers as well. Will continue to follow to progress mobility tolerance as well as safety prior to D/C home.   Follow Up Recommendations  Home health PT;Supervision/Assistance - 24 hour     Equipment Recommendations  Rolling walker with 5" wheels    Recommendations for Other Services       Precautions / Restrictions Precautions Precautions: Knee;Fall Required Braces or Orthoses: Knee Immobilizer - Left Knee Immobilizer - Left: On at all times;Other (comment) (except with PT and CPM) Restrictions Weight Bearing Restrictions: Yes LLE Weight Bearing: Weight bearing as tolerated    Mobility  Bed Mobility Overal bed mobility: Needs Assistance Bed Mobility: Supine to Sit;Sit to Supine     Supine to sit: Min assist (assist with LLE) Sit to supine: Min assist (min assist LLE)      Transfers Overall transfer level: Needs assistance Equipment used: Rolling walker (2 wheeled) Transfers: Sit to/from Stand Sit to Stand: Supervision         General transfer comment: no cues for hand position needed with transfer  Ambulation/Gait Ambulation/Gait assistance: Min guard Ambulation Distance (Feet): 60 Feet Assistive device: Rolling walker (2 wheeled) Gait Pattern/deviations: Step-to pattern;Decreased step length - left;Decreased weight shift to right Gait velocity: decreased   General Gait Details:  Pt using step-to pattern but improved ability to weight shift to LLE. Improving stability and consistency.    Stairs            Wheelchair Mobility    Modified Rankin (Stroke Patients Only)       Balance Overall balance assessment: Needs assistance Sitting-balance support: No upper extremity supported Sitting balance-Leahy Scale: Good     Standing balance support: Bilateral upper extremity supported Standing balance-Leahy Scale: Poor Standing balance comment: using rw                    Cognition Arousal/Alertness: Awake/alert Behavior During Therapy: WFL for tasks assessed/performed Overall Cognitive Status: Within Functional Limits for tasks assessed                      Exercises Total Joint Exercises Ankle Circles/Pumps: AROM;Both;10 reps Quad Sets: Strengthening;Left;10 reps Heel Slides: AAROM;Left;10 reps (mod assist) Hip ABduction/ADduction: Strengthening;Left;10 reps (mod assist) Straight Leg Raises: Strengthening;Left;10 reps (mod assist)    General Comments        Pertinent Vitals/Pain Pain Assessment: 0-10 Pain Score: 8  Pain Location: Lt knee  Pain Descriptors / Indicators: Sore Pain Intervention(s): Limited activity within patient's tolerance;Monitored during session    Home Living                      Prior Function            PT Goals (current goals can now be found in the care plan section) Acute Rehab PT Goals Patient Stated Goal: walk better. PT Goal Formulation: With patient Time For Goal Achievement: 09/30/15 Potential to Achieve Goals: Good Progress  towards PT goals: Progressing toward goals    Frequency  7X/week    PT Plan Current plan remains appropriate    Co-evaluation             End of Session Equipment Utilized During Treatment: Gait belt;Left knee immobilizer Activity Tolerance: Patient tolerated treatment well;Patient limited by fatigue Patient left: in bed;in CPM;with call  bell/phone within reach;with SCD's reapplied     Time: [id number] PT Time Calculation (min) (ACUTE ONLY): 29 min  Charges:  $Gait Training: 8-22 mins $Therapeutic Exercise: 8-22 mins                    G Codes:      Cassell Clement, PT, CSCS Pager [phone] Office [phone]  09/17/2015, 3:50 PM

## 2015-09-17 NOTE — Progress Notes (Signed)
Initial Nutrition Assessment  DOCUMENTATION CODES:   Not applicable  INTERVENTION:  Provide Glucerna Shake po TID, each supplement provides 220 kcal and 10 grams of protein.  Encourage adequate PO intake.   NUTRITION DIAGNOSIS:   Inadequate oral intake related to poor appetite as evidenced by meal completion < 50%.  GOAL:   Patient will meet greater than or equal to 90% of their needs  MONITOR:   PO intake, Supplement acceptance, Weight trends, Labs, I & O's, Skin  REASON FOR ASSESSMENT:   Malnutrition Screening Tool    ASSESSMENT:   82 y.o. female, has a history of pain and functional disability in the left knee due to arthritis and has failed non-surgical conservative treatments . PMH: diabetes, hypertension, osteoporosis, obesitiy, hepatitis B, anxiety.   Procedure (4/25): COMPUTER ASSISTED TOTAL KNEE ARTHROPLASTY (Left)  Pt reports having a decreased appetite. Meal completion has been 30-50%. Pt reports eating fairly well at home with usual consumption of at least 2-3 meals daily. Usual body weight reported to be ~188 lbs last weighing in July 2016. Pt with a 14.8% weight loss in 9 months. Pt reports she has been consuming Glucerna Shake at home at least once daily. RD to order Glucerna to aid in caloric and protein needs. Pt encouraged to eat her food at meals.   Nutrition-Focused physical exam completed. Findings are no fat depletion, no muscle depletion, and no edema.   Labs and medications reviewed.   Diet Order:  Diet regular Room service appropriate?: Yes; Fluid consistency:: Thin  Skin:   (Incision on L knee)  Last BM:  4/24  Height:   Ht Readings from Last 1 Encounters:  09/11/15 5\' 3"  (1.6 m)    Weight:   Wt Readings from Last 1 Encounters:  09/15/15 160 lb (72.576 kg)    Ideal Body Weight:  52.27 kg  BMI:  Body mass index is 28.35 kg/(m^2).  Estimated Nutritional Needs:   Kcal:  1800-1900  Protein:  75-85 grams  Fluid:  1.8 - 1.9  L/day  EDUCATION NEEDS:   No education needs identified at this time  Corrin Parker, MS, RD, LDN Pager # [phone] After hours/ weekend pager # [phone]

## 2015-09-17 NOTE — Progress Notes (Signed)
Physical Therapy Treatment Patient Details Name: Lisa Sutton MRN: [id number] DOB: 11-06-1933 Today's Date: 09/17/2015    History of Present Illness Pt is a 82 y.o. female now s/p Lt TKA. PMH: diabetes, hypertension, osteoporosis, obesitiy, hepatitis B, anxiety.     PT Comments    Attempting to progress mobility during PT session. Pt continues to struggle with consistent weightbearing through the Lt LE, frequent cues needed. Additionally, with fatigue and urgency, pt demonstrates decreased safety awareness. This was discussed at length including need for safety if going to D/C to home. Pt would benefit from further PT sessions to work on ambulation and safety prior to returning to home. Will continue to follow.   Follow Up Recommendations  Home health PT;Supervision for mobility/OOB     Equipment Recommendations  Rolling walker with 5" wheels    Recommendations for Other Services       Precautions / Restrictions Precautions Precautions: Knee Required Braces or Orthoses: Knee Immobilizer - Left Knee Immobilizer - Left: On at all times;Other (comment) (except with PT and CPM) Restrictions Weight Bearing Restrictions: Yes LLE Weight Bearing: Weight bearing as tolerated    Mobility  Bed Mobility               General bed mobility comments: up in chair upon arrival  Transfers Overall transfer level: Needs assistance Equipment used: Rolling walker (2 wheeled) Transfers: Sit to/from Stand Sit to Stand: Min guard         General transfer comment: cues for hand position  Ambulation/Gait Ambulation/Gait assistance: Min guard Ambulation Distance (Feet): 60 Feet Assistive device: Rolling walker (2 wheeled) Gait Pattern/deviations: Step-through pattern;Decreased weight shift to right Gait velocity: decreased   General Gait Details: Repeated cues needed throughout session for weightbearing through LLE. Pt alternating between step through pattern to step to pattern  with sliding Rt foot forward.    Stairs            Wheelchair Mobility    Modified Rankin (Stroke Patients Only)       Balance Overall balance assessment: Needs assistance Sitting-balance support: No upper extremity supported Sitting balance-Leahy Scale: Good     Standing balance support: Bilateral upper extremity supported Standing balance-Leahy Scale: Poor Standing balance comment: using rw                    Cognition Arousal/Alertness: Awake/alert Behavior During Therapy: WFL for tasks assessed/performed Overall Cognitive Status: Within Functional Limits for tasks assessed           Safety/Judgement: Decreased awareness of safety     General Comments: reviewing safety concerns, primarily with urgency and need to follow safety recommendations.     Exercises Total Joint Exercises Ankle Circles/Pumps: AROM;Both;10 reps Quad Sets: Strengthening;Left;10 reps Heel Slides: AAROM;Left;10 reps (mod assist) Hip ABduction/ADduction: Strengthening;Left;10 reps (mod assist) Straight Leg Raises: Strengthening;Left;10 reps (mod assist) Goniometric ROM: 8-70 degrees    General Comments        Pertinent Vitals/Pain Pain Assessment: 0-10 Pain Score: 5  Pain Location: Lt knee Pain Descriptors / Indicators: Aching Pain Intervention(s): Limited activity within patient's tolerance;Monitored during session    Home Living                      Prior Function            PT Goals (current goals can now be found in the care plan section) Acute Rehab PT Goals Patient Stated Goal: Get back home. PT Goal Formulation:  With patient Time For Goal Achievement: 09/30/15 Potential to Achieve Goals: Good Progress towards PT goals: Progressing toward goals    Frequency  7X/week    PT Plan Current plan remains appropriate    Co-evaluation             End of Session Equipment Utilized During Treatment: Gait belt;Left knee immobilizer Activity  Tolerance: Patient tolerated treatment well;Patient limited by fatigue Patient left: in chair;with call bell/phone within reach;with chair alarm set     Time: [id number] PT Time Calculation (min) (ACUTE ONLY): 27 min  Charges:  $Gait Training: 8-22 mins $Therapeutic Exercise: 8-22 mins                    G Codes:      Cassell Clement, PT, CSCS Pager [phone] Office [phone]  09/17/2015, 10:49 AM

## 2015-09-17 NOTE — Progress Notes (Signed)
Occupational Therapy Treatment Patient Details Name: Lisa Sutton MRN: [id number] DOB: [birth year] Today's Date: 09/17/2015    History of present illness Pt is a 82 y.o. female now s/p Lt TKA. PMH: diabetes, hypertension, osteoporosis, obesitiy, hepatitis B, anxiety.    OT comments  Pt progressing. Education provided in session. Plan for pt to practice shower transfer next session prior to d/c home.  Follow Up Recommendations  No OT follow up;Supervision/Assistance - 24 hour    Equipment Recommendations  3 in 1 bedside comode    Recommendations for Other Services      Precautions / Restrictions Precautions Precautions: Knee;Fall Precaution Comments: educated on knee precautions Required Braces or Orthoses: Knee Immobilizer - Left Knee Immobilizer - Left: On at all times;Other (comment) (except with PT and CPM) Restrictions Weight Bearing Restrictions: Yes LLE Weight Bearing: Weight bearing as tolerated       Mobility Bed Mobility               General bed mobility comments: pt in chair  Transfers Overall transfer level: Needs assistance Equipment used: Rolling walker (2 wheeled) Transfers: Sit to/from Stand Sit to Stand: Supervision (and set up for RW)         General transfer comment: cues for technique    Balance Performed functional task while standing without LOB-Min guard.                ADL Overall ADL's : Needs assistance/impaired Eating/Feeding: Independent;Sitting Eating/Feeding Details (indicate cue type and reason): drank liquid                 Lower Body Dressing: Sitting/lateral leans (pt donned left sock at supervision level; OT donned/doffed knee immobilizer)    Toilet Transfer: Min guard;Ambulation;RW;Comfort height toilet;Grab bars   Toileting- Clothing Manipulation and Hygiene: Min guard (standing/sitting)       Functional mobility during ADLs: Min guard;Rolling walker General ADL Comments: Educated on LB dressing  technique. Educated on safety such as safe footwear, use of bag on walker, and recommended someone be with her for shower transfer.  Educated on shower transfer technique. Discussed 3 in 1. Explained benefit of reaching down to left foot as it allows knee to bend. Pt reports she has built in seats in shower-OT also discussed that she could use 3 in 1 as shower chair.      Vision                     Perception     Praxis      Cognition  Awake/Alert Behavior During Therapy: WFL for tasks assessed/performed Overall Cognitive Status: Within Functional Limits for tasks assessed               Extremity/Trunk Assessment                  Shoulder Instructions       General Comments      Pertinent Vitals/ Pain       Pain Assessment: 0-10 Pain Score:  (5-6) Pain Location: Lt knee Pain Descriptors / Indicators: Sore Pain Intervention(s): Monitored during session;Repositioned  Home Living                                          Prior Functioning/Environment              Frequency Min 2X/week  Progress Toward Goals  OT Goals(current goals can now be found in the care plan section)  Progress towards OT goals: Progressing toward goals  Acute Rehab OT Goals Patient Stated Goal: not stated OT Goal Formulation: With patient Time For Goal Achievement: 09/23/15 Potential to Achieve Goals: Good ADL Goals Pt Will Perform Lower Body Bathing: with min guard assist;with caregiver independent in assisting;sit to/from stand;with adaptive equipment Pt Will Perform Lower Body Dressing: with min guard assist;with caregiver independent in assisting;sit to/from stand;with adaptive equipment Pt Will Transfer to Toilet: with supervision;ambulating;bedside commode Pt Will Perform Toileting - Clothing Manipulation and hygiene: with supervision;sit to/from stand Pt Will Perform Tub/Shower Transfer: with min assist;with caregiver independent in  assisting;3 in 1;Shower transfer;ambulating  Plan Discharge plan remains appropriate;Equipment recommendations need to be updated    Co-evaluation                 End of Session Equipment Utilized During Treatment: Rolling walker;Left knee immobilizer   Activity Tolerance Patient tolerated treatment well   Patient Left in chair;with call bell/phone within reach;with chair alarm set   Nurse Communication          Time: [id number] OT Time Calculation (min): 16 min  Charges: OT General Charges $OT Visit: 1 Procedure OT Treatments $Self Care/Home Management : 8-22 mins  Benito Mccreedy OTR/L [id number] 09/17/2015, 11:28 AM

## 2015-09-17 NOTE — Progress Notes (Signed)
Orthopedic Tech Progress Note Patient Details:  Lisa Sutton 06/20/1933 [id number] Ortho visit 1930 Patient ID: [name], female   DOB: 1934-04-04, 82 y.o.   MRN: [id number]   [name] 09/17/2015, 7:34 PM

## 2015-09-17 NOTE — Progress Notes (Signed)
Orthopedic Tech Progress Note Patient Details:  Lisa Sutton Oct 21, 1933 [id number]  Patient ID: [name], female   DOB: 1933/11/23, 82 y.o.   MRN: [id number] Placed pt's lle on cpm @0 -50 degrees @1450   Hildred Priest 09/17/2015, 2:53 PM

## 2015-09-18 LAB — CBC
HCT: 29.8 % — ABNORMAL LOW (ref 36.0–46.0)
Hemoglobin: 9.8 g/dL — ABNORMAL LOW (ref 12.0–15.0)
MCH: 30.2 pg (ref 26.0–34.0)
MCHC: 32.9 g/dL (ref 30.0–36.0)
MCV: 92 fL (ref 78.0–100.0)
PLATELETS: 239 10*3/uL (ref 150–400)
RBC: 3.24 MIL/uL — AB (ref 3.87–5.11)
RDW: 13.1 % (ref 11.5–15.5)
WBC: 9.6 10*3/uL (ref 4.0–10.5)

## 2015-09-18 LAB — BASIC METABOLIC PANEL
ANION GAP: 10 (ref 5–15)
BUN: 26 mg/dL — ABNORMAL HIGH (ref 6–20)
CALCIUM: 9.3 mg/dL (ref 8.9–10.3)
CO2: 25 mmol/L (ref 22–32)
CREATININE: 1.54 mg/dL — AB (ref 0.44–1.00)
Chloride: 104 mmol/L (ref 101–111)
GFR calc non Af Amer: 31 mL/min — ABNORMAL LOW (ref >60)
GFR, EST AFRICAN AMERICAN: 36 mL/min — AB (ref >60)
GLUCOSE: 115 mg/dL — AB (ref 65–99)
Potassium: 4.5 mmol/L (ref 3.5–5.1)
SODIUM: 139 mmol/L (ref 135–145)

## 2015-09-18 MED ORDER — MAGNESIUM CITRATE PO SOLN
0.5000 | Freq: Once | ORAL | Status: AC
  Administered 2015-09-18: 0.5 via ORAL
  Filled 2015-09-18 (×2): qty 296

## 2015-09-18 NOTE — Progress Notes (Signed)
Pt finally had a BM at [facility] after multiple meds given(miralax, colace, mg citrate) and has since had 2 more BM's. Pt and son has been Given discharge instructions and verbalized understanding of all and of medications.  Hard prescriptions given to pt.  Pt is aware  That [facility] home health for PT will be coming to her house.  Walker and bsc given to son at discharge. All other belongings with pt. Pt discharged in nad via wheelchair.

## 2015-09-18 NOTE — Progress Notes (Signed)
Occupational Therapy Treatment Patient Details Name: Lisa Sutton MRN: [id number] DOB: 20-Jul-1933 Today's Date: 09/18/2015    History of present illness Pt is a 82 y.o. female now s/p Lt TKA. PMH: diabetes, hypertension, osteoporosis, obesitiy, hepatitis B, anxiety.    OT comments  Pt making great progress towards OT goals, continue plan of care for now. Recommend 3-n-1 for safe showers and shower transfers & recommend 24/7 supervision post acute d/c at this time.   Follow Up Recommendations  No OT follow up;Supervision/Assistance - 24 hour    Equipment Recommendations  3 in 1 bedside comode    Recommendations for Other Services  None at this time   Precautions / Restrictions Precautions Precautions: Knee;Fall Precaution Comments: educated on knee precautions Required Braces or Orthoses: Knee Immobilizer - Left Knee Immobilizer - Left: On at all times;Other (comment) (except with PT and CPM) Restrictions Weight Bearing Restrictions: Yes LLE Weight Bearing: Weight bearing as tolerated    Mobility Bed Mobility General bed mobility comments: Pt found seated in recliner upon OT entering/exiting room   Transfers Overall transfer level: Needs assistance Equipment used: Rolling walker (2 wheeled) Transfers: Sit to/from Stand Sit to Stand: Supervision;Min guard General transfer comment: Cues for hand placement and supervision to min guard for safety    Balance Overall balance assessment: Needs assistance Sitting-balance support: No upper extremity supported;Feet supported Sitting balance-Leahy Scale: Good     Standing balance support: Bilateral upper extremity supported;During functional activity Standing balance-Leahy Scale: Fair Standing balance comment: using RW   ADL Overall ADL's : Needs assistance/impaired Eating/Feeding: Independent;Sitting   Grooming: Set up;Sitting   Upper Body Bathing: Set up;Sitting   Lower Body Bathing: Moderate assistance;Sit to/from  stand   Upper Body Dressing : Set up;Sitting   Lower Body Dressing: Moderate assistance;Sit to/from stand   Toilet Transfer: Min guard;Ambulation;RW;BSC   Toileting- Water quality scientist and Hygiene: Sit to/from stand;Supervision/safety   Tub/ Shower Transfer: Walk-in shower;3 in [employer];Anterior/posterior Tub/Shower Transfer Details (indicate cue type and reason): simulated  Functional mobility during ADLs: Min guard;Rolling walker;Supervision/safety       Cognition   Behavior During Therapy: WFL for tasks assessed/performed Overall Cognitive Status: Within Functional Limits for tasks assessed                 Pertinent Vitals/ Pain       Pain Assessment: Faces Faces Pain Scale: Hurts even more Pain Location: LLE Pain Descriptors / Indicators: Sore Pain Intervention(s): Limited activity within patient's tolerance;Monitored during session;Repositioned   Frequency Min 2X/week     Progress Toward Goals  OT Goals(current goals can now befound in the care plan section)  Progress towards OT goals: Progressing toward goals  Acute Rehab OT Goals Patient Stated Goal: go home soon OT Goal Formulation: With patient Time For Goal Achievement: 09/23/15 Potential to Achieve Goals: Good  Plan Discharge plan remains appropriate    End of Session Equipment Utilized During Treatment: Rolling walker;Left knee immobilizer;Gait belt   Activity Tolerance Patient tolerated treatment well   Patient Left in chair;with call bell/phone within reach;with chair alarm set    Time: 1047-1100 OT Time Calculation (min): 13 min  Charges: OT General Charges $OT Visit: 1 Procedure OT Treatments $Self Care/Home Management : 8-22 mins  Chrys Racer , MS, OTR/L, CLT Pager: [phone]  09/18/2015, 11:08 AM

## 2015-09-18 NOTE — Progress Notes (Signed)
Subjective: Doing well.  Pain controlled.  Making progress with PT.  No bowel movement x 3-4 days.  Denies abd pain, N/V.  Positive flatus.     Objective: Vital signs in last 24 hours: Temp:  [98 F (36.7 C)-99 F (37.2 C)] 98 F (36.7 C) (04/27 0510) Pulse Rate:  [76-82] 82 (04/27 0510) Resp:  [18] 18 (04/27 0510) BP: (131-135)/(61-70) 135/64 mmHg (04/27 0510) SpO2:  [95 %-100 %] 95 % (04/27 0510)  Intake/Output from previous day: 04/26 0701 - 04/27 0700 In: 470 [P.O.:470] Out: -  Intake/Output this shift:     Recent Labs  09/16/15 0414 09/17/15 0351 09/18/15 0526  HGB 10.5* 10.3* 9.8*    Recent Labs  09/17/15 0351 09/18/15 0526  WBC 11.0* 9.6  RBC 3.52* 3.24*  HCT 31.3* 29.8*  PLT 237 239    Recent Labs  09/17/15 0351 09/18/15 0526  NA 138 139  K 3.6 4.5  CL 102 104  CO2 26 25  BUN 18 26*  CREATININE 1.32* 1.54*  GLUCOSE 135* 115*  CALCIUM 8.9 9.3   No results for input(s): LABPT, INR in the last 72 hours.  Exam:  Wounds look good.  steris intact.  No drainage or signs of infection.  bilat calves nontender, nvi.   Assessment/Plan: D/c home today if does well with PT.  Scripts on chart for percocet, aspirin.  F/u 2 weeks postop for recheck.    Brittini Brubeck M 09/18/2015, 10:19 AM

## 2015-09-18 NOTE — Progress Notes (Signed)
Physical Therapy Treatment Patient Details Name: Lisa Sutton MRN: [id number] DOB: 1933-07-11 Today's Date: 09/18/2015    History of Present Illness Pt is a 82 y.o. female now s/p Lt TKA. PMH: diabetes, hypertension, osteoporosis, obesitiy, hepatitis B, anxiety.     PT Comments    Pt able to perform stairs today with assistance. Ambulation is improving as well but continues to use a step-to pattern. No loss of balance was noted during PT session. Continue to advise assistance when ambulating at home for safety. Pt verbalized understanding. Will continue to follow.   Follow Up Recommendations  Home health PT;Supervision/Assistance - 24 hour     Equipment Recommendations  Rolling walker with 5" wheels    Recommendations for Other Services       Precautions / Restrictions Precautions Precautions: Knee;Fall Required Braces or Orthoses: Knee Immobilizer - Left Knee Immobilizer - Left: On at all times;Other (comment) (except with PT and CPM) Restrictions Weight Bearing Restrictions: Yes LLE Weight Bearing: Weight bearing as tolerated    Mobility  Bed Mobility               General bed mobility comments: up in chair upon arrival  Transfers Overall transfer level: Needs assistance Equipment used: Rolling walker (2 wheeled) Transfers: Sit to/from Stand Sit to Stand: Min guard         General transfer comment: no cues needed for hand placement, safe technique demonstrated.   Ambulation/Gait Ambulation/Gait assistance: Min guard Ambulation Distance (Feet): 50 Feet Assistive device: Rolling walker (2 wheeled) Gait Pattern/deviations: Step-to pattern Gait velocity: decreased   General Gait Details: Pt continues to use step-to pattern but stable with ambulation. Pt reporting that she her ambulation distance is limited by fatigue.    Stairs Stairs: Yes Stairs assistance: Min guard Stair Management: Two rails;Step to pattern;Forwards Number of Stairs:  5 General stair comments: Pt confirms that she can reach both rails on her stairs at home. Pt reports feeling confident with getting into her home.   Wheelchair Mobility    Modified Rankin (Stroke Patients Only)       Balance Overall balance assessment: Needs assistance Sitting-balance support: No upper extremity supported Sitting balance-Leahy Scale: Good     Standing balance support: Bilateral upper extremity supported Standing balance-Leahy Scale: Fair Standing balance comment: using rw                    Cognition Arousal/Alertness: Awake/alert Behavior During Therapy: WFL for tasks assessed/performed Overall Cognitive Status: Within Functional Limits for tasks assessed                      Exercises Total Joint Exercises Ankle Circles/Pumps: AROM;Both;10 reps Quad Sets: Strengthening;Left;10 reps Heel Slides: AAROM;Left;10 reps (mod assist) Hip ABduction/ADduction: Strengthening;Left;10 reps (mod assist) Straight Leg Raises: Strengthening;Left;10 reps (mod assist) Goniometric ROM: 72 degrees flexion    General Comments        Pertinent Vitals/Pain Pain Assessment: 0-10 Pain Score: 7  Pain Location: LLE Pain Descriptors / Indicators: Sore Pain Intervention(s): Limited activity within patient's tolerance;Monitored during session    Home Living                      Prior Function            PT Goals (current goals can now be found in the care plan section) Acute Rehab PT Goals Patient Stated Goal: go home soon PT Goal Formulation: With patient Time For Goal Achievement:  09/30/15 Potential to Achieve Goals: Good Progress towards PT goals: Progressing toward goals    Frequency  7X/week    PT Plan Current plan remains appropriate    Co-evaluation             End of Session Equipment Utilized During Treatment: Gait belt;Left knee immobilizer Activity Tolerance: Patient tolerated treatment well;Patient limited by  fatigue Patient left: in chair;with call bell/phone within reach;with chair alarm set     Time: [id number] PT Time Calculation (min) (ACUTE ONLY): 29 min  Charges:  $Gait Training: 8-22 mins $Therapeutic Exercise: 8-22 mins                    G Codes:      Cassell Clement, PT, CSCS Pager [phone] Office [phone]  09/18/2015, 3:27 PM

## 2015-09-18 NOTE — Progress Notes (Signed)
Pt seen for second PT session. Focus of session was on reviewing HEP. This was performed with the patient and reinforced using handout for reference. Pt denies any questions or concerns following the session. PT to continue to follow in anticipation of D/C to home when medically released.   09/18/15 1550  PT Visit Information  Last PT Received On 09/18/15  Assistance Needed +1  History of Present Illness Pt is a 82 y.o. female now s/p Lt TKA. PMH: diabetes, hypertension, osteoporosis, obesitiy, hepatitis B, anxiety.   Subjective Data  Subjective Hoping to be able to go home here soon.   Patient Stated Goal go to the bathroom so she can get home.   Precautions  Precautions Knee;Fall  Required Braces or Orthoses Knee Immobilizer - Left  Knee Immobilizer - Left On at all times;Other (comment) (except with PT and CPM)  Restrictions  Weight Bearing Restrictions Yes  LLE Weight Bearing WBAT  Pain Assessment  Pain Assessment 0-10  Pain Score 6  Pain Location LLE  Pain Descriptors / Indicators Sore  Pain Intervention(s) Limited activity within patient's tolerance  Cognition  Arousal/Alertness Awake/alert  Behavior During Therapy WFL for tasks assessed/performed  Overall Cognitive Status Within Functional Limits for tasks assessed  Bed Mobility  General bed mobility comments up in chair upon arrival  Transfers  General transfer comment pt declined any transfers.   Ambulation/Gait  General Gait Details declined  Balance  Sitting-balance support No upper extremity supported  Sitting balance-Leahy Scale Good  Total Joint Exercises  Ankle Circles/Pumps AROM;Both;10 reps  Quad Sets Strengthening;Left;10 reps  Short Arc Quad Strengthening;Left;10 reps (min assist to supervision)  Heel Slides AAROM;Left;10 reps (mod assist)  Hip ABduction/ADduction Strengthening;Left;10 reps (mod assist)  Straight Leg Raises Strengthening;Left;10 reps (mod assist)  Long Arc Quad Strengthening;Left;10  reps (in available range)  Knee Flexion AROM;Left;10 reps;Seated  PT - End of Session  Activity Tolerance Patient tolerated treatment well;Patient limited by fatigue  Patient left in chair;with call bell/phone within reach;with chair alarm set  Nurse Communication Mobility status;Weight bearing status  PT - Assessment/Plan  PT Plan Current plan remains appropriate  PT Frequency (ACUTE ONLY) 7X/week  Follow Up Recommendations Home health PT;Supervision/Assistance - 24 hour  PT equipment Rolling walker with 5" wheels  PT Goal Progression  Progress towards PT goals Progressing toward goals  Acute Rehab PT Goals  PT Goal Formulation With patient  Time For Goal Achievement 09/30/15  Potential to Achieve Goals Good  PT Time Calculation  PT Start Time (ACUTE ONLY) 1347  PT Stop Time (ACUTE ONLY) 1401  PT Time Calculation (min) (ACUTE ONLY) 14 min  PT General Charges  $$ ACUTE PT VISIT 1 Procedure  PT Treatments  $Therapeutic Exercise 8-22 mins  Cassell Clement, PT, CSCS Pager [phone] Office [phone]

## 2015-09-18 NOTE — Care Management Important Message (Signed)
Important Message  Patient Details  Name: EMALINA PROCH MRN: [id number] Date of Birth: [birth year]   Medicare Important Message Given:  Yes    Cordae Mccarey P Jossiah Smoak 09/18/2015, 3:06 PM

## 2015-09-18 NOTE — Discharge Instructions (Signed)
INSTRUCTIONS AFTER JOINT REPLACEMENT   o Remove items at home which could result in a fall. This includes throw rugs or furniture in walking pathways o ICE to the affected joint every three hours while awake for 30 minutes at a time, for at least the first 3-5 days, and then as needed for pain and swelling.  Continue to use ice for pain and swelling. You may notice swelling that will progress down to the foot and ankle.  This is normal after surgery.  Elevate your leg when you are not up walking on it.   o Continue to use the breathing machine you got in the hospital (incentive spirometer) which will help keep your temperature down.  It is common for your temperature to cycle up and down following surgery, especially at night when you are not up moving around and exerting yourself.  The breathing machine keeps your lungs expanded and your temperature down.   DIET:  As you were doing prior to hospitalization, we recommend a well-balanced diet.  DRESSING / WOUND CARE / SHOWERING  You may shower 3 days after surgery, but keep the wounds dry during showering.  You may use an occlusive plastic wrap (Press'n Seal for example), NO SOAKING/SUBMERGING IN THE BATHTUB.  If the bandage gets wet, change with a clean dry gauze.  If the incision gets wet, pat the wound dry with a clean towel.  ACTIVITY  o Increase activity slowly as tolerated, but follow the weight bearing instructions below.   o No driving for 6 weeks or until further direction given by your physician.  You cannot drive while taking narcotics.  o No lifting or carrying greater than 10 lbs. until further directed by your surgeon. o Avoid periods of inactivity such as sitting longer than an hour when not asleep. This helps prevent blood clots.  o You may return to work once you are authorized by your doctor.     WEIGHT BEARING   Weight bearing as tolerated with assist device (walker, cane, etc) as directed, use it as long as suggested by  your surgeon or therapist, typically at least 4-6 weeks.   EXERCISES  Results after joint replacement surgery are often greatly improved when you follow the exercise, range of motion and muscle strengthening exercises prescribed by your doctor. Safety measures are also important to protect the joint from further injury. Any time any of these exercises cause you to have increased pain or swelling, decrease what you are doing until you are comfortable again and then slowly increase them. If you have problems or questions, call your caregiver or physical therapist for advice.   Rehabilitation is important following a joint replacement. After just a few days of immobilization, the muscles of the leg can become weakened and shrink (atrophy).  These exercises are designed to build up the tone and strength of the thigh and leg muscles and to improve motion. Often times heat used for twenty to thirty minutes before working out will loosen up your tissues and help with improving the range of motion but do not use heat for the first two weeks following surgery (sometimes heat can increase post-operative swelling).   These exercises can be done on a training (exercise) mat, on the floor, on a table or on a bed. Use whatever works the best and is most comfortable for you.    Use music or television while you are exercising so that the exercises are a pleasant break in your day. This will  make your life better with the exercises acting as a break in your routine that you can look forward to.   Perform all exercises about fifteen times, three times per day or as directed.  You should exercise both the operative leg and the other leg as well.  Exercises include:    Quad Sets - Tighten up the muscle on the front of the thigh (Quad) and hold for 5-10 seconds.    Straight Leg Raises - With your knee straight (if you were given a brace, keep it on), lift the leg to 60 degrees, hold for 3 seconds, and slowly lower the  leg.  Perform this exercise against resistance later as your leg gets stronger.   Leg Slides: Lying on your back, slowly slide your foot toward your buttocks, bending your knee up off the floor (only go as far as is comfortable). Then slowly slide your foot back down until your leg is flat on the floor again.   Angel Wings: Lying on your back spread your legs to the side as far apart as you can without causing discomfort.   Hamstring Strength:  Lying on your back, push your heel against the floor with your leg straight by tightening up the muscles of your buttocks.  Repeat, but this time bend your knee to a comfortable angle, and push your heel against the floor.  You may put a pillow under the heel to make it more comfortable if necessary.   A rehabilitation program following joint replacement surgery can speed recovery and prevent re-injury in the future due to weakened muscles. Contact your doctor or a physical therapist for more information on knee rehabilitation.    CONSTIPATION  Constipation is defined medically as fewer than three stools per week and severe constipation as less than one stool per week.  Even if you have a regular bowel pattern at home, your normal regimen is likely to be disrupted due to multiple reasons following surgery.  Combination of anesthesia, postoperative narcotics, change in appetite and fluid intake all can affect your bowels.   YOU MUST use at least one of the following options; they are listed in order of increasing strength to get the job done.  They are all available over the counter, and you may need to use some, POSSIBLY even all of these options:    Drink plenty of fluids (prune juice may be helpful) and high fiber foods Colace 100 mg by mouth twice a day  Senokot for constipation as directed and as needed Dulcolax (bisacodyl), take with full glass of water  Miralax (polyethylene glycol) once or twice a day as needed.  If you have tried all these things  and are unable to have a bowel movement in the first 3-4 days after surgery call either your surgeon or your primary doctor.    If you experience loose stools or diarrhea, hold the medications until you stool forms back up.  If your symptoms do not get better within 1 week or if they get worse, check with your doctor.  If you experience "the worst abdominal pain ever" or develop nausea or vomiting, please contact the office immediately for further recommendations for treatment.   ITCHING:  If you experience itching with your medications, try taking only a single pain pill, or even half a pain pill at a time.  You can also use Benadryl over the counter for itching or also to help with sleep.   TED HOSE STOCKINGS:  Use stockings  on both legs until for at least 2 weeks or as directed by physician office. They may be removed at night for sleeping.  MEDICATIONS:  See your medication summary on the After Visit Summary that nursing will review with you.  You may have some home medications which will be placed on hold until you complete the course of blood thinner medication.  It is important for you to complete the blood thinner medication as prescribed.  PRECAUTIONS:  If you experience chest pain or shortness of breath - call 911 immediately for transfer to the hospital emergency department.   If you develop a fever greater that 101 F, purulent drainage from wound, increased redness or drainage from wound, foul odor from the wound/dressing, or calf pain - CONTACT YOUR SURGEON.                                                   FOLLOW-UP APPOINTMENTS:  If you do not already have a post-op appointment, please call the office for an appointment to be seen by your surgeon.  Guidelines for how soon to be seen are listed in your After Visit Summary, but are typically between 1-4 weeks after surgery.  OTHER INSTRUCTIONS:   Knee Replacement:  Do not place pillow under knee, focus on keeping the knee straight  while resting. CPM instructions: 0-90 degrees, 2 hours in the morning, 2 hours in the afternoon, and 2 hours in the evening. Place foam block, curve side up under heel at all times except when in CPM or when walking.  DO NOT modify, tear, cut, or change the foam block in any way.  MAKE SURE YOU:   Understand these instructions.   Get help right away if you are not doing well or get worse.    Thank you for letting us be a part of your medical care team.  It is a privilege we respect greatly.  We hope these instructions will help you stay on track for a fast and full recovery!

## 2015-09-19 DIAGNOSIS — M1991 Primary osteoarthritis, unspecified site: Secondary | ICD-10-CM | POA: Diagnosis not present

## 2015-09-19 DIAGNOSIS — M810 Age-related osteoporosis without current pathological fracture: Secondary | ICD-10-CM | POA: Diagnosis not present

## 2015-09-19 DIAGNOSIS — Z7984 Long term (current) use of oral hypoglycemic drugs: Secondary | ICD-10-CM | POA: Diagnosis not present

## 2015-09-19 DIAGNOSIS — I10 Essential (primary) hypertension: Secondary | ICD-10-CM | POA: Diagnosis not present

## 2015-09-19 DIAGNOSIS — Z96652 Presence of left artificial knee joint: Secondary | ICD-10-CM | POA: Diagnosis not present

## 2015-09-19 DIAGNOSIS — R1310 Dysphagia, unspecified: Secondary | ICD-10-CM | POA: Diagnosis not present

## 2015-09-19 DIAGNOSIS — E43 Unspecified severe protein-calorie malnutrition: Secondary | ICD-10-CM | POA: Diagnosis not present

## 2015-09-19 DIAGNOSIS — Z7982 Long term (current) use of aspirin: Secondary | ICD-10-CM | POA: Diagnosis not present

## 2015-09-19 DIAGNOSIS — Z471 Aftercare following joint replacement surgery: Secondary | ICD-10-CM | POA: Diagnosis not present

## 2015-09-19 DIAGNOSIS — E1142 Type 2 diabetes mellitus with diabetic polyneuropathy: Secondary | ICD-10-CM | POA: Diagnosis not present

## 2015-09-19 NOTE — Anesthesia Postprocedure Evaluation (Signed)
Anesthesia Post Note  Patient: Lisa Sutton  Procedure(s) Performed: Procedure(s) (LRB): COMPUTER ASSISTED TOTAL KNEE ARTHROPLASTY (Left)  Patient location during evaluation: PACU Anesthesia Type: General and Regional Level of consciousness: awake and alert Pain management: pain level controlled Vital Signs Assessment: post-procedure vital signs reviewed and stable Respiratory status: spontaneous breathing, nonlabored ventilation, respiratory function stable and patient connected to nasal cannula oxygen Cardiovascular status: blood pressure returned to baseline and stable Postop Assessment: no signs of nausea or vomiting Anesthetic complications: no    Last Vitals:  Filed Vitals:   09/18/15 0510 09/18/15 1219  BP: 135/64 107/83  Pulse: 82 81  Temp: 36.7 C 36.8 C  Resp: 18 18    Last Pain:  Filed Vitals:   09/18/15 1316  PainSc: 2                  Lil Lepage,JAMES TERRILL

## 2015-09-20 DIAGNOSIS — Z7984 Long term (current) use of oral hypoglycemic drugs: Secondary | ICD-10-CM | POA: Diagnosis not present

## 2015-09-20 DIAGNOSIS — M1991 Primary osteoarthritis, unspecified site: Secondary | ICD-10-CM | POA: Diagnosis not present

## 2015-09-20 DIAGNOSIS — R1310 Dysphagia, unspecified: Secondary | ICD-10-CM | POA: Diagnosis not present

## 2015-09-20 DIAGNOSIS — Z96652 Presence of left artificial knee joint: Secondary | ICD-10-CM | POA: Diagnosis not present

## 2015-09-20 DIAGNOSIS — E43 Unspecified severe protein-calorie malnutrition: Secondary | ICD-10-CM | POA: Diagnosis not present

## 2015-09-20 DIAGNOSIS — Z471 Aftercare following joint replacement surgery: Secondary | ICD-10-CM | POA: Diagnosis not present

## 2015-09-20 DIAGNOSIS — E1142 Type 2 diabetes mellitus with diabetic polyneuropathy: Secondary | ICD-10-CM | POA: Diagnosis not present

## 2015-09-20 DIAGNOSIS — Z7982 Long term (current) use of aspirin: Secondary | ICD-10-CM | POA: Diagnosis not present

## 2015-09-20 DIAGNOSIS — M810 Age-related osteoporosis without current pathological fracture: Secondary | ICD-10-CM | POA: Diagnosis not present

## 2015-09-20 DIAGNOSIS — I10 Essential (primary) hypertension: Secondary | ICD-10-CM | POA: Diagnosis not present

## 2015-09-22 ENCOUNTER — Telehealth: Payer: Self-pay | Admitting: Family Medicine

## 2015-09-22 DIAGNOSIS — M1991 Primary osteoarthritis, unspecified site: Secondary | ICD-10-CM | POA: Diagnosis not present

## 2015-09-22 DIAGNOSIS — R1310 Dysphagia, unspecified: Secondary | ICD-10-CM | POA: Diagnosis not present

## 2015-09-22 DIAGNOSIS — Z7982 Long term (current) use of aspirin: Secondary | ICD-10-CM | POA: Diagnosis not present

## 2015-09-22 DIAGNOSIS — I10 Essential (primary) hypertension: Secondary | ICD-10-CM | POA: Diagnosis not present

## 2015-09-22 DIAGNOSIS — E1142 Type 2 diabetes mellitus with diabetic polyneuropathy: Secondary | ICD-10-CM | POA: Diagnosis not present

## 2015-09-22 DIAGNOSIS — Z7984 Long term (current) use of oral hypoglycemic drugs: Secondary | ICD-10-CM | POA: Diagnosis not present

## 2015-09-22 DIAGNOSIS — E43 Unspecified severe protein-calorie malnutrition: Secondary | ICD-10-CM | POA: Diagnosis not present

## 2015-09-22 DIAGNOSIS — M810 Age-related osteoporosis without current pathological fracture: Secondary | ICD-10-CM | POA: Diagnosis not present

## 2015-09-22 DIAGNOSIS — Z471 Aftercare following joint replacement surgery: Secondary | ICD-10-CM | POA: Diagnosis not present

## 2015-09-22 DIAGNOSIS — Z96652 Presence of left artificial knee joint: Secondary | ICD-10-CM | POA: Diagnosis not present

## 2015-09-22 MED ORDER — ONDANSETRON HCL 4 MG PO TABS: 4.0000 mg | ORAL_TABLET | Freq: Three times a day (TID) | ORAL | Status: DC | PRN

## 2015-09-22 NOTE — Telephone Encounter (Signed)
Patient requesting anti-emetic to take with analgesic

## 2015-09-23 DIAGNOSIS — R1310 Dysphagia, unspecified: Secondary | ICD-10-CM | POA: Diagnosis not present

## 2015-09-23 DIAGNOSIS — Z471 Aftercare following joint replacement surgery: Secondary | ICD-10-CM | POA: Diagnosis not present

## 2015-09-23 DIAGNOSIS — M810 Age-related osteoporosis without current pathological fracture: Secondary | ICD-10-CM | POA: Diagnosis not present

## 2015-09-23 DIAGNOSIS — E43 Unspecified severe protein-calorie malnutrition: Secondary | ICD-10-CM | POA: Diagnosis not present

## 2015-09-23 DIAGNOSIS — I10 Essential (primary) hypertension: Secondary | ICD-10-CM | POA: Diagnosis not present

## 2015-09-23 DIAGNOSIS — Z96652 Presence of left artificial knee joint: Secondary | ICD-10-CM | POA: Diagnosis not present

## 2015-09-23 DIAGNOSIS — M1991 Primary osteoarthritis, unspecified site: Secondary | ICD-10-CM | POA: Diagnosis not present

## 2015-09-23 DIAGNOSIS — Z7984 Long term (current) use of oral hypoglycemic drugs: Secondary | ICD-10-CM | POA: Diagnosis not present

## 2015-09-23 DIAGNOSIS — Z7982 Long term (current) use of aspirin: Secondary | ICD-10-CM | POA: Diagnosis not present

## 2015-09-23 DIAGNOSIS — E1142 Type 2 diabetes mellitus with diabetic polyneuropathy: Secondary | ICD-10-CM | POA: Diagnosis not present

## 2015-09-23 NOTE — Discharge Summary (Signed)
Patient ID: Lisa Sutton MRN: [id number] DOB/AGE: [birth year] 82 y.o.  Admit date: 09/15/2015 Discharge date: 09/23/2015  Admission Diagnoses:  Active Problems:   Status post total left knee replacement   Discharge Diagnoses:  Active Problems:   Status post total left knee replacement  status post Procedure(s): COMPUTER ASSISTED TOTAL KNEE ARTHROPLASTY  Past Medical History  Diagnosis Date  . Type 2 diabetes mellitus ([place])   . Essential hypertension, benign   . GERD (gastroesophageal reflux disease)   . Osteoporosis   . Obesity   . Hypothyroidism   . Dysphagia     'sometimes but not a major issue' been checked out by GI (per pt)  . Fatty liver   . Kidney stone August 2014    Patient was seen at [facility]  . PONV (postoperative nausea and vomiting)   . Dysrhythmia     'heart used to skip but doesn't anymore' was checked out by Dr. Ron Parker late '90s, everything checked out ok and not had any skipping since (all per pt)  . Hepatitis B     had at age 59, 'GI doc said it's gone away'  . History of hiatal hernia   . Arthritis   . Cataract   . Hypercalcemia   . Anxiety   . Hepatitis B surface antigen positive     Surgeries: Procedure(s): COMPUTER ASSISTED TOTAL KNEE ARTHROPLASTY on 09/15/2015   Consultants:    Discharged Condition: Improved  Hospital Course: Lisa Sutton is an 82 y.o. female who was admitted 09/15/2015 for operative treatment of knee djd. Patient failed conservative treatments (please see the history and physical for the specifics) and had severe unremitting pain that affects sleep, daily activities and work/hobbies. After pre-op clearance, the patient was taken to the operating room on 09/15/2015 and underwent  Procedure(s): COMPUTER ASSISTED TOTAL KNEE ARTHROPLASTY.    Patient was given perioperative antibiotics:  Anti-infectives    Start     Dose/Rate Route Frequency Ordered Stop   09/15/15 1830  ceFAZolin (ANCEF) IVPB 1 g/50 mL premix     1  g 100 mL/hr over 30 Minutes Intravenous Every 6 hours 09/15/15 1436 09/16/15 0017   09/15/15 0918  ceFAZolin (ANCEF) 2-4 GM/100ML-% IVPB    Comments:  Leandrew Koyanagi   : cabinet override      09/15/15 0918 09/15/15 1155   09/14/15 0711  ceFAZolin (ANCEF) IVPB 2g/100 mL premix  Status:  Discontinued     2 g 200 mL/hr over 30 Minutes Intravenous On call to O.R. 09/14/15 0711 09/15/15 1730       Patient was given sequential compression devices and early ambulation to prevent DVT.   Patient benefited maximally from hospital stay and there were no complications. At the time of discharge, the patient was urinating/moving their bowels without difficulty, tolerating a regular diet, pain is controlled with oral pain medications and they have been cleared by PT/OT.   Recent vital signs: No data found.    Recent laboratory studies: No results for input(s): WBC, HGB, HCT, PLT, NA, K, CL, CO2, BUN, CREATININE, GLUCOSE, INR, CALCIUM in the last 72 hours.  Invalid input(s): PT, 2   Discharge Medications:     Medication List    STOP taking these medications        acetaminophen 500 MG tablet  Commonly known as:  TYLENOL     CENTRUM SILVER ADULT 50+ Tabs      TAKE these medications  ACCU-CHEK AVIVA PLUS test strip  Generic drug:  glucose blood  Test blood sugar two times  daily     ALPRAZolam 0.25 MG tablet  Commonly known as:  XANAX  Take 1 tablet (0.25 mg total) by mouth 2 (two) times daily as needed for anxiety.     amLODipine 2.5 MG tablet  Commonly known as:  NORVASC  Take 1 tablet by mouth  daily     aspirin 325 MG EC tablet  Take 1 tablet (325 mg total) by mouth daily with breakfast.     atenolol 50 MG tablet  Commonly known as:  TENORMIN  Take 1 tablet by mouth  daily     furosemide 20 MG tablet  Commonly known as:  LASIX  Take 2 tablets (40 mg total) by mouth daily.     levothyroxine 25 MCG tablet  Commonly known as:  LEVOTHROID  Take 1 tablet (25 mcg  total) by mouth daily before breakfast.     levothyroxine 112 MCG tablet  Commonly known as:  SYNTHROID, LEVOTHROID  Take 1 tablet (112 mcg total) by mouth daily.     lisinopril 20 MG tablet  Commonly known as:  PRINIVIL,ZESTRIL  Take 1 tablet (20 mg total) by mouth daily.     metFORMIN 500 MG tablet  Commonly known as:  GLUCOPHAGE  Take 1 tablet (500 mg total) by mouth 2 (two) times daily with a meal.     methocarbamol 500 MG tablet  Commonly known as:  ROBAXIN  Take 1 tablet (500 mg total) by mouth every 6 (six) hours as needed for muscle spasms.     Omeprazole-Sodium Bicarbonate 20-1100 MG Caps capsule  Commonly known as:  ZEGERID  Take 1 capsule by mouth daily as needed (for heartburn or acid reflux).     oxyCODONE-acetaminophen 5-325 MG tablet  Commonly known as:  PERCOCET/ROXICET  Take 1 tablet by mouth every 6 (six) hours as needed for severe pain.        Diagnostic Studies: Dg Esophagus  08/28/2015  CLINICAL DATA:  Solid food dysphagia at proximal esophagus, heartburn indigestion for 2 months, history of prior EGD EXAM: ESOPHOGRAM / BARIUM SWALLOW / BARIUM TABLET STUDY TECHNIQUE: Combined double contrast and single contrast examination performed using effervescent crystals, thick barium liquid, and thin barium liquid. The patient was observed with fluoroscopy swallowing a 13 mm barium sulphate tablet. FLUOROSCOPY TIME:  Radiation Exposure Index (as provided by the fluoroscopic device): Not provided If the device does not provide the exposure index: Fluoroscopy Time:  2 minutes 18 seconds Number of Acquired Images: 15 plus multiple screen captures during fluoroscopy COMPARISON:  None FINDINGS: Smooth appearance of esophageal mucosa without irregularity or ulceration. No persistent intraluminal filling defects. Small sliding hiatal hernia. 12.5 mm diameter tablet lodged at the gastroesophageal junction and remained at this site for a minute, not passing with water or initial  swallow of thick barium but and eventually passing into the stomach with an additional large swallow of thick barium. No additional areas of narrowing or mass identified. Targeted rapid sequence imaging of the cervical esophagus and hypopharynx showed no laryngeal penetration or aspiration. Minimal vallecular residuals. Transiently noted feline esophagus, physiologic variant. IMPRESSION: Small sliding hiatal hernia. Transient obstruction of a 12.5 mm diameter barium tablet at the gastroesophageal junction, failing to cross into the stomach with swallows of water and thick barium but eventually passing into the stomach with an additional large swallow of thick barium. Electronically Signed   By: [name]  Thornton Papas M.D.   On: 08/28/2015 16:07   Dg Knee Left Port  09/15/2015  CLINICAL DATA:  S/p left total knee replacement EXAM: PORTABLE LEFT KNEE - 1-2 VIEW COMPARISON:  None. FINDINGS: Components of left knee arthroplasty project in expected location. No fracture or dislocation. Scattered subcutaneous gas bubbles consistent with recent surgery. IMPRESSION: Left knee arthroplasty without apparent complication. Electronically Signed   By: Lucrezia Europe M.D.   On: 09/15/2015 15:05          Follow-up Information    Follow up with [facility].   Why:  They will contact you to schedule home therapy visits.   Contact information:   [address] [place] [address] [phone]       Schedule an appointment as soon as possible for a visit with Marybelle Killings, MD.   Specialty:  Orthopedic Surgery   Why:  need return office visit 2 weeks postop   Contact information:   [place] [address] [phone]       Discharge Plan:  discharge to home  Disposition:     Signed: Lanae Crumbly for Dr Rodell Perna [facility] 09/23/2015, 4:12 PM

## 2015-09-24 DIAGNOSIS — R1310 Dysphagia, unspecified: Secondary | ICD-10-CM | POA: Diagnosis not present

## 2015-09-24 DIAGNOSIS — Z96652 Presence of left artificial knee joint: Secondary | ICD-10-CM | POA: Diagnosis not present

## 2015-09-24 DIAGNOSIS — E43 Unspecified severe protein-calorie malnutrition: Secondary | ICD-10-CM | POA: Diagnosis not present

## 2015-09-24 DIAGNOSIS — M810 Age-related osteoporosis without current pathological fracture: Secondary | ICD-10-CM | POA: Diagnosis not present

## 2015-09-24 DIAGNOSIS — I10 Essential (primary) hypertension: Secondary | ICD-10-CM | POA: Diagnosis not present

## 2015-09-24 DIAGNOSIS — Z7982 Long term (current) use of aspirin: Secondary | ICD-10-CM | POA: Diagnosis not present

## 2015-09-24 DIAGNOSIS — M1991 Primary osteoarthritis, unspecified site: Secondary | ICD-10-CM | POA: Diagnosis not present

## 2015-09-24 DIAGNOSIS — Z471 Aftercare following joint replacement surgery: Secondary | ICD-10-CM | POA: Diagnosis not present

## 2015-09-24 DIAGNOSIS — Z7984 Long term (current) use of oral hypoglycemic drugs: Secondary | ICD-10-CM | POA: Diagnosis not present

## 2015-09-24 DIAGNOSIS — E1142 Type 2 diabetes mellitus with diabetic polyneuropathy: Secondary | ICD-10-CM | POA: Diagnosis not present

## 2015-09-25 DIAGNOSIS — Z7984 Long term (current) use of oral hypoglycemic drugs: Secondary | ICD-10-CM | POA: Diagnosis not present

## 2015-09-25 DIAGNOSIS — R1310 Dysphagia, unspecified: Secondary | ICD-10-CM | POA: Diagnosis not present

## 2015-09-25 DIAGNOSIS — E43 Unspecified severe protein-calorie malnutrition: Secondary | ICD-10-CM | POA: Diagnosis not present

## 2015-09-25 DIAGNOSIS — I10 Essential (primary) hypertension: Secondary | ICD-10-CM | POA: Diagnosis not present

## 2015-09-25 DIAGNOSIS — Z96652 Presence of left artificial knee joint: Secondary | ICD-10-CM | POA: Diagnosis not present

## 2015-09-25 DIAGNOSIS — M1991 Primary osteoarthritis, unspecified site: Secondary | ICD-10-CM | POA: Diagnosis not present

## 2015-09-25 DIAGNOSIS — Z471 Aftercare following joint replacement surgery: Secondary | ICD-10-CM | POA: Diagnosis not present

## 2015-09-25 DIAGNOSIS — Z7982 Long term (current) use of aspirin: Secondary | ICD-10-CM | POA: Diagnosis not present

## 2015-09-25 DIAGNOSIS — E1142 Type 2 diabetes mellitus with diabetic polyneuropathy: Secondary | ICD-10-CM | POA: Diagnosis not present

## 2015-09-25 DIAGNOSIS — M810 Age-related osteoporosis without current pathological fracture: Secondary | ICD-10-CM | POA: Diagnosis not present

## 2015-09-26 DIAGNOSIS — E1142 Type 2 diabetes mellitus with diabetic polyneuropathy: Secondary | ICD-10-CM | POA: Diagnosis not present

## 2015-09-26 DIAGNOSIS — M1991 Primary osteoarthritis, unspecified site: Secondary | ICD-10-CM | POA: Diagnosis not present

## 2015-09-26 DIAGNOSIS — Z471 Aftercare following joint replacement surgery: Secondary | ICD-10-CM | POA: Diagnosis not present

## 2015-09-26 DIAGNOSIS — Z96652 Presence of left artificial knee joint: Secondary | ICD-10-CM | POA: Diagnosis not present

## 2015-09-26 DIAGNOSIS — Z7984 Long term (current) use of oral hypoglycemic drugs: Secondary | ICD-10-CM | POA: Diagnosis not present

## 2015-09-26 DIAGNOSIS — I10 Essential (primary) hypertension: Secondary | ICD-10-CM | POA: Diagnosis not present

## 2015-09-26 DIAGNOSIS — R1310 Dysphagia, unspecified: Secondary | ICD-10-CM | POA: Diagnosis not present

## 2015-09-26 DIAGNOSIS — E43 Unspecified severe protein-calorie malnutrition: Secondary | ICD-10-CM | POA: Diagnosis not present

## 2015-09-26 DIAGNOSIS — M810 Age-related osteoporosis without current pathological fracture: Secondary | ICD-10-CM | POA: Diagnosis not present

## 2015-09-26 DIAGNOSIS — Z7982 Long term (current) use of aspirin: Secondary | ICD-10-CM | POA: Diagnosis not present

## 2015-09-29 ENCOUNTER — Encounter: Payer: Self-pay | Admitting: *Deleted

## 2015-09-29 DIAGNOSIS — Z96652 Presence of left artificial knee joint: Secondary | ICD-10-CM | POA: Diagnosis not present

## 2015-09-29 DIAGNOSIS — E43 Unspecified severe protein-calorie malnutrition: Secondary | ICD-10-CM | POA: Diagnosis not present

## 2015-09-29 DIAGNOSIS — Z7984 Long term (current) use of oral hypoglycemic drugs: Secondary | ICD-10-CM | POA: Diagnosis not present

## 2015-09-29 DIAGNOSIS — Z471 Aftercare following joint replacement surgery: Secondary | ICD-10-CM | POA: Diagnosis not present

## 2015-09-29 DIAGNOSIS — M1991 Primary osteoarthritis, unspecified site: Secondary | ICD-10-CM | POA: Diagnosis not present

## 2015-09-29 DIAGNOSIS — M810 Age-related osteoporosis without current pathological fracture: Secondary | ICD-10-CM | POA: Diagnosis not present

## 2015-09-29 DIAGNOSIS — E1142 Type 2 diabetes mellitus with diabetic polyneuropathy: Secondary | ICD-10-CM | POA: Diagnosis not present

## 2015-09-29 DIAGNOSIS — I10 Essential (primary) hypertension: Secondary | ICD-10-CM | POA: Diagnosis not present

## 2015-09-29 DIAGNOSIS — Z7982 Long term (current) use of aspirin: Secondary | ICD-10-CM | POA: Diagnosis not present

## 2015-09-29 DIAGNOSIS — R1310 Dysphagia, unspecified: Secondary | ICD-10-CM | POA: Diagnosis not present

## 2015-09-30 DIAGNOSIS — M25562 Pain in left knee: Secondary | ICD-10-CM | POA: Diagnosis not present

## 2015-10-01 DIAGNOSIS — R1310 Dysphagia, unspecified: Secondary | ICD-10-CM | POA: Diagnosis not present

## 2015-10-01 DIAGNOSIS — M810 Age-related osteoporosis without current pathological fracture: Secondary | ICD-10-CM | POA: Diagnosis not present

## 2015-10-01 DIAGNOSIS — M1991 Primary osteoarthritis, unspecified site: Secondary | ICD-10-CM | POA: Diagnosis not present

## 2015-10-01 DIAGNOSIS — I10 Essential (primary) hypertension: Secondary | ICD-10-CM | POA: Diagnosis not present

## 2015-10-01 DIAGNOSIS — E43 Unspecified severe protein-calorie malnutrition: Secondary | ICD-10-CM | POA: Diagnosis not present

## 2015-10-01 DIAGNOSIS — Z471 Aftercare following joint replacement surgery: Secondary | ICD-10-CM | POA: Diagnosis not present

## 2015-10-01 DIAGNOSIS — E1142 Type 2 diabetes mellitus with diabetic polyneuropathy: Secondary | ICD-10-CM | POA: Diagnosis not present

## 2015-10-01 DIAGNOSIS — Z96652 Presence of left artificial knee joint: Secondary | ICD-10-CM | POA: Diagnosis not present

## 2015-10-01 DIAGNOSIS — Z7984 Long term (current) use of oral hypoglycemic drugs: Secondary | ICD-10-CM | POA: Diagnosis not present

## 2015-10-01 DIAGNOSIS — Z7982 Long term (current) use of aspirin: Secondary | ICD-10-CM | POA: Diagnosis not present

## 2015-10-02 DIAGNOSIS — Z471 Aftercare following joint replacement surgery: Secondary | ICD-10-CM | POA: Diagnosis not present

## 2015-10-02 DIAGNOSIS — Z7982 Long term (current) use of aspirin: Secondary | ICD-10-CM | POA: Diagnosis not present

## 2015-10-02 DIAGNOSIS — I10 Essential (primary) hypertension: Secondary | ICD-10-CM | POA: Diagnosis not present

## 2015-10-02 DIAGNOSIS — E43 Unspecified severe protein-calorie malnutrition: Secondary | ICD-10-CM | POA: Diagnosis not present

## 2015-10-02 DIAGNOSIS — R1310 Dysphagia, unspecified: Secondary | ICD-10-CM | POA: Diagnosis not present

## 2015-10-02 DIAGNOSIS — Z96652 Presence of left artificial knee joint: Secondary | ICD-10-CM | POA: Diagnosis not present

## 2015-10-02 DIAGNOSIS — M1991 Primary osteoarthritis, unspecified site: Secondary | ICD-10-CM | POA: Diagnosis not present

## 2015-10-02 DIAGNOSIS — M810 Age-related osteoporosis without current pathological fracture: Secondary | ICD-10-CM | POA: Diagnosis not present

## 2015-10-02 DIAGNOSIS — Z7984 Long term (current) use of oral hypoglycemic drugs: Secondary | ICD-10-CM | POA: Diagnosis not present

## 2015-10-02 DIAGNOSIS — E1142 Type 2 diabetes mellitus with diabetic polyneuropathy: Secondary | ICD-10-CM | POA: Diagnosis not present

## 2015-10-03 DIAGNOSIS — E43 Unspecified severe protein-calorie malnutrition: Secondary | ICD-10-CM | POA: Diagnosis not present

## 2015-10-03 DIAGNOSIS — Z471 Aftercare following joint replacement surgery: Secondary | ICD-10-CM | POA: Diagnosis not present

## 2015-10-03 DIAGNOSIS — M1991 Primary osteoarthritis, unspecified site: Secondary | ICD-10-CM | POA: Diagnosis not present

## 2015-10-03 DIAGNOSIS — I10 Essential (primary) hypertension: Secondary | ICD-10-CM | POA: Diagnosis not present

## 2015-10-03 DIAGNOSIS — Z7984 Long term (current) use of oral hypoglycemic drugs: Secondary | ICD-10-CM | POA: Diagnosis not present

## 2015-10-03 DIAGNOSIS — Z96652 Presence of left artificial knee joint: Secondary | ICD-10-CM | POA: Diagnosis not present

## 2015-10-03 DIAGNOSIS — R1310 Dysphagia, unspecified: Secondary | ICD-10-CM | POA: Diagnosis not present

## 2015-10-03 DIAGNOSIS — E1142 Type 2 diabetes mellitus with diabetic polyneuropathy: Secondary | ICD-10-CM | POA: Diagnosis not present

## 2015-10-03 DIAGNOSIS — Z7982 Long term (current) use of aspirin: Secondary | ICD-10-CM | POA: Diagnosis not present

## 2015-10-03 DIAGNOSIS — M810 Age-related osteoporosis without current pathological fracture: Secondary | ICD-10-CM | POA: Diagnosis not present

## 2015-10-06 DIAGNOSIS — Z7984 Long term (current) use of oral hypoglycemic drugs: Secondary | ICD-10-CM | POA: Diagnosis not present

## 2015-10-06 DIAGNOSIS — Z471 Aftercare following joint replacement surgery: Secondary | ICD-10-CM | POA: Diagnosis not present

## 2015-10-06 DIAGNOSIS — I10 Essential (primary) hypertension: Secondary | ICD-10-CM | POA: Diagnosis not present

## 2015-10-06 DIAGNOSIS — E1142 Type 2 diabetes mellitus with diabetic polyneuropathy: Secondary | ICD-10-CM | POA: Diagnosis not present

## 2015-10-06 DIAGNOSIS — R1310 Dysphagia, unspecified: Secondary | ICD-10-CM | POA: Diagnosis not present

## 2015-10-06 DIAGNOSIS — M1991 Primary osteoarthritis, unspecified site: Secondary | ICD-10-CM | POA: Diagnosis not present

## 2015-10-06 DIAGNOSIS — Z7982 Long term (current) use of aspirin: Secondary | ICD-10-CM | POA: Diagnosis not present

## 2015-10-06 DIAGNOSIS — M810 Age-related osteoporosis without current pathological fracture: Secondary | ICD-10-CM | POA: Diagnosis not present

## 2015-10-06 DIAGNOSIS — Z96652 Presence of left artificial knee joint: Secondary | ICD-10-CM | POA: Diagnosis not present

## 2015-10-06 DIAGNOSIS — E43 Unspecified severe protein-calorie malnutrition: Secondary | ICD-10-CM | POA: Diagnosis not present

## 2015-10-08 DIAGNOSIS — Z96652 Presence of left artificial knee joint: Secondary | ICD-10-CM | POA: Diagnosis not present

## 2015-10-08 DIAGNOSIS — M810 Age-related osteoporosis without current pathological fracture: Secondary | ICD-10-CM | POA: Diagnosis not present

## 2015-10-08 DIAGNOSIS — R1310 Dysphagia, unspecified: Secondary | ICD-10-CM | POA: Diagnosis not present

## 2015-10-08 DIAGNOSIS — Z7984 Long term (current) use of oral hypoglycemic drugs: Secondary | ICD-10-CM | POA: Diagnosis not present

## 2015-10-08 DIAGNOSIS — Z7982 Long term (current) use of aspirin: Secondary | ICD-10-CM | POA: Diagnosis not present

## 2015-10-08 DIAGNOSIS — I10 Essential (primary) hypertension: Secondary | ICD-10-CM | POA: Diagnosis not present

## 2015-10-08 DIAGNOSIS — E43 Unspecified severe protein-calorie malnutrition: Secondary | ICD-10-CM | POA: Diagnosis not present

## 2015-10-08 DIAGNOSIS — Z471 Aftercare following joint replacement surgery: Secondary | ICD-10-CM | POA: Diagnosis not present

## 2015-10-08 DIAGNOSIS — E1142 Type 2 diabetes mellitus with diabetic polyneuropathy: Secondary | ICD-10-CM | POA: Diagnosis not present

## 2015-10-08 DIAGNOSIS — M1991 Primary osteoarthritis, unspecified site: Secondary | ICD-10-CM | POA: Diagnosis not present

## 2015-10-09 DIAGNOSIS — M1991 Primary osteoarthritis, unspecified site: Secondary | ICD-10-CM | POA: Diagnosis not present

## 2015-10-09 DIAGNOSIS — E43 Unspecified severe protein-calorie malnutrition: Secondary | ICD-10-CM | POA: Diagnosis not present

## 2015-10-09 DIAGNOSIS — E1142 Type 2 diabetes mellitus with diabetic polyneuropathy: Secondary | ICD-10-CM | POA: Diagnosis not present

## 2015-10-09 DIAGNOSIS — Z96652 Presence of left artificial knee joint: Secondary | ICD-10-CM | POA: Diagnosis not present

## 2015-10-09 DIAGNOSIS — I10 Essential (primary) hypertension: Secondary | ICD-10-CM | POA: Diagnosis not present

## 2015-10-09 DIAGNOSIS — M810 Age-related osteoporosis without current pathological fracture: Secondary | ICD-10-CM | POA: Diagnosis not present

## 2015-10-09 DIAGNOSIS — R1310 Dysphagia, unspecified: Secondary | ICD-10-CM | POA: Diagnosis not present

## 2015-10-09 DIAGNOSIS — Z7984 Long term (current) use of oral hypoglycemic drugs: Secondary | ICD-10-CM | POA: Diagnosis not present

## 2015-10-09 DIAGNOSIS — Z7982 Long term (current) use of aspirin: Secondary | ICD-10-CM | POA: Diagnosis not present

## 2015-10-09 DIAGNOSIS — Z471 Aftercare following joint replacement surgery: Secondary | ICD-10-CM | POA: Diagnosis not present

## 2015-10-10 DIAGNOSIS — Z7982 Long term (current) use of aspirin: Secondary | ICD-10-CM | POA: Diagnosis not present

## 2015-10-10 DIAGNOSIS — Z7984 Long term (current) use of oral hypoglycemic drugs: Secondary | ICD-10-CM | POA: Diagnosis not present

## 2015-10-10 DIAGNOSIS — I10 Essential (primary) hypertension: Secondary | ICD-10-CM | POA: Diagnosis not present

## 2015-10-10 DIAGNOSIS — E43 Unspecified severe protein-calorie malnutrition: Secondary | ICD-10-CM | POA: Diagnosis not present

## 2015-10-10 DIAGNOSIS — Z471 Aftercare following joint replacement surgery: Secondary | ICD-10-CM | POA: Diagnosis not present

## 2015-10-10 DIAGNOSIS — M810 Age-related osteoporosis without current pathological fracture: Secondary | ICD-10-CM | POA: Diagnosis not present

## 2015-10-10 DIAGNOSIS — E1142 Type 2 diabetes mellitus with diabetic polyneuropathy: Secondary | ICD-10-CM | POA: Diagnosis not present

## 2015-10-10 DIAGNOSIS — Z96652 Presence of left artificial knee joint: Secondary | ICD-10-CM | POA: Diagnosis not present

## 2015-10-10 DIAGNOSIS — R1310 Dysphagia, unspecified: Secondary | ICD-10-CM | POA: Diagnosis not present

## 2015-10-10 DIAGNOSIS — M1991 Primary osteoarthritis, unspecified site: Secondary | ICD-10-CM | POA: Diagnosis not present

## 2015-10-14 DIAGNOSIS — M1991 Primary osteoarthritis, unspecified site: Secondary | ICD-10-CM | POA: Diagnosis not present

## 2015-10-14 DIAGNOSIS — E1142 Type 2 diabetes mellitus with diabetic polyneuropathy: Secondary | ICD-10-CM | POA: Diagnosis not present

## 2015-10-14 DIAGNOSIS — Z7982 Long term (current) use of aspirin: Secondary | ICD-10-CM | POA: Diagnosis not present

## 2015-10-14 DIAGNOSIS — Z96652 Presence of left artificial knee joint: Secondary | ICD-10-CM | POA: Diagnosis not present

## 2015-10-14 DIAGNOSIS — Z471 Aftercare following joint replacement surgery: Secondary | ICD-10-CM | POA: Diagnosis not present

## 2015-10-14 DIAGNOSIS — R1310 Dysphagia, unspecified: Secondary | ICD-10-CM | POA: Diagnosis not present

## 2015-10-14 DIAGNOSIS — I10 Essential (primary) hypertension: Secondary | ICD-10-CM | POA: Diagnosis not present

## 2015-10-14 DIAGNOSIS — Z7984 Long term (current) use of oral hypoglycemic drugs: Secondary | ICD-10-CM | POA: Diagnosis not present

## 2015-10-14 DIAGNOSIS — M810 Age-related osteoporosis without current pathological fracture: Secondary | ICD-10-CM | POA: Diagnosis not present

## 2015-10-14 DIAGNOSIS — E43 Unspecified severe protein-calorie malnutrition: Secondary | ICD-10-CM | POA: Diagnosis not present

## 2015-10-15 DIAGNOSIS — Z961 Presence of intraocular lens: Secondary | ICD-10-CM | POA: Diagnosis not present

## 2015-10-15 DIAGNOSIS — E119 Type 2 diabetes mellitus without complications: Secondary | ICD-10-CM | POA: Diagnosis not present

## 2015-10-15 LAB — HM DIABETES EYE EXAM

## 2015-10-16 DIAGNOSIS — M810 Age-related osteoporosis without current pathological fracture: Secondary | ICD-10-CM | POA: Diagnosis not present

## 2015-10-16 DIAGNOSIS — Z471 Aftercare following joint replacement surgery: Secondary | ICD-10-CM | POA: Diagnosis not present

## 2015-10-16 DIAGNOSIS — Z7984 Long term (current) use of oral hypoglycemic drugs: Secondary | ICD-10-CM | POA: Diagnosis not present

## 2015-10-16 DIAGNOSIS — E1142 Type 2 diabetes mellitus with diabetic polyneuropathy: Secondary | ICD-10-CM | POA: Diagnosis not present

## 2015-10-16 DIAGNOSIS — I10 Essential (primary) hypertension: Secondary | ICD-10-CM | POA: Diagnosis not present

## 2015-10-16 DIAGNOSIS — Z96652 Presence of left artificial knee joint: Secondary | ICD-10-CM | POA: Diagnosis not present

## 2015-10-16 DIAGNOSIS — Z7982 Long term (current) use of aspirin: Secondary | ICD-10-CM | POA: Diagnosis not present

## 2015-10-16 DIAGNOSIS — M1991 Primary osteoarthritis, unspecified site: Secondary | ICD-10-CM | POA: Diagnosis not present

## 2015-10-16 DIAGNOSIS — E43 Unspecified severe protein-calorie malnutrition: Secondary | ICD-10-CM | POA: Diagnosis not present

## 2015-10-16 DIAGNOSIS — R1310 Dysphagia, unspecified: Secondary | ICD-10-CM | POA: Diagnosis not present

## 2015-10-17 DIAGNOSIS — Z7984 Long term (current) use of oral hypoglycemic drugs: Secondary | ICD-10-CM | POA: Diagnosis not present

## 2015-10-17 DIAGNOSIS — R1310 Dysphagia, unspecified: Secondary | ICD-10-CM | POA: Diagnosis not present

## 2015-10-17 DIAGNOSIS — E1142 Type 2 diabetes mellitus with diabetic polyneuropathy: Secondary | ICD-10-CM | POA: Diagnosis not present

## 2015-10-17 DIAGNOSIS — Z96652 Presence of left artificial knee joint: Secondary | ICD-10-CM | POA: Diagnosis not present

## 2015-10-17 DIAGNOSIS — I10 Essential (primary) hypertension: Secondary | ICD-10-CM | POA: Diagnosis not present

## 2015-10-17 DIAGNOSIS — M1991 Primary osteoarthritis, unspecified site: Secondary | ICD-10-CM | POA: Diagnosis not present

## 2015-10-17 DIAGNOSIS — M810 Age-related osteoporosis without current pathological fracture: Secondary | ICD-10-CM | POA: Diagnosis not present

## 2015-10-17 DIAGNOSIS — Z7982 Long term (current) use of aspirin: Secondary | ICD-10-CM | POA: Diagnosis not present

## 2015-10-17 DIAGNOSIS — E43 Unspecified severe protein-calorie malnutrition: Secondary | ICD-10-CM | POA: Diagnosis not present

## 2015-10-17 DIAGNOSIS — Z471 Aftercare following joint replacement surgery: Secondary | ICD-10-CM | POA: Diagnosis not present

## 2015-10-21 DIAGNOSIS — Z7984 Long term (current) use of oral hypoglycemic drugs: Secondary | ICD-10-CM | POA: Diagnosis not present

## 2015-10-21 DIAGNOSIS — E43 Unspecified severe protein-calorie malnutrition: Secondary | ICD-10-CM | POA: Diagnosis not present

## 2015-10-21 DIAGNOSIS — E1142 Type 2 diabetes mellitus with diabetic polyneuropathy: Secondary | ICD-10-CM | POA: Diagnosis not present

## 2015-10-21 DIAGNOSIS — R1310 Dysphagia, unspecified: Secondary | ICD-10-CM | POA: Diagnosis not present

## 2015-10-21 DIAGNOSIS — I10 Essential (primary) hypertension: Secondary | ICD-10-CM | POA: Diagnosis not present

## 2015-10-21 DIAGNOSIS — M810 Age-related osteoporosis without current pathological fracture: Secondary | ICD-10-CM | POA: Diagnosis not present

## 2015-10-21 DIAGNOSIS — Z96652 Presence of left artificial knee joint: Secondary | ICD-10-CM | POA: Diagnosis not present

## 2015-10-21 DIAGNOSIS — Z7982 Long term (current) use of aspirin: Secondary | ICD-10-CM | POA: Diagnosis not present

## 2015-10-21 DIAGNOSIS — M1991 Primary osteoarthritis, unspecified site: Secondary | ICD-10-CM | POA: Diagnosis not present

## 2015-10-21 DIAGNOSIS — Z471 Aftercare following joint replacement surgery: Secondary | ICD-10-CM | POA: Diagnosis not present

## 2015-10-24 DIAGNOSIS — E1142 Type 2 diabetes mellitus with diabetic polyneuropathy: Secondary | ICD-10-CM | POA: Diagnosis not present

## 2015-10-24 DIAGNOSIS — Z471 Aftercare following joint replacement surgery: Secondary | ICD-10-CM | POA: Diagnosis not present

## 2015-10-24 DIAGNOSIS — M810 Age-related osteoporosis without current pathological fracture: Secondary | ICD-10-CM | POA: Diagnosis not present

## 2015-10-24 DIAGNOSIS — M1991 Primary osteoarthritis, unspecified site: Secondary | ICD-10-CM | POA: Diagnosis not present

## 2015-10-24 DIAGNOSIS — Z7982 Long term (current) use of aspirin: Secondary | ICD-10-CM | POA: Diagnosis not present

## 2015-10-24 DIAGNOSIS — Z96652 Presence of left artificial knee joint: Secondary | ICD-10-CM | POA: Diagnosis not present

## 2015-10-24 DIAGNOSIS — R1310 Dysphagia, unspecified: Secondary | ICD-10-CM | POA: Diagnosis not present

## 2015-10-24 DIAGNOSIS — Z7984 Long term (current) use of oral hypoglycemic drugs: Secondary | ICD-10-CM | POA: Diagnosis not present

## 2015-10-24 DIAGNOSIS — E43 Unspecified severe protein-calorie malnutrition: Secondary | ICD-10-CM | POA: Diagnosis not present

## 2015-10-24 DIAGNOSIS — I10 Essential (primary) hypertension: Secondary | ICD-10-CM | POA: Diagnosis not present

## 2015-10-28 DIAGNOSIS — M1991 Primary osteoarthritis, unspecified site: Secondary | ICD-10-CM | POA: Diagnosis not present

## 2015-10-28 DIAGNOSIS — Z7984 Long term (current) use of oral hypoglycemic drugs: Secondary | ICD-10-CM | POA: Diagnosis not present

## 2015-10-28 DIAGNOSIS — I10 Essential (primary) hypertension: Secondary | ICD-10-CM | POA: Diagnosis not present

## 2015-10-28 DIAGNOSIS — E43 Unspecified severe protein-calorie malnutrition: Secondary | ICD-10-CM | POA: Diagnosis not present

## 2015-10-28 DIAGNOSIS — E1142 Type 2 diabetes mellitus with diabetic polyneuropathy: Secondary | ICD-10-CM | POA: Diagnosis not present

## 2015-10-28 DIAGNOSIS — Z7982 Long term (current) use of aspirin: Secondary | ICD-10-CM | POA: Diagnosis not present

## 2015-10-28 DIAGNOSIS — Z471 Aftercare following joint replacement surgery: Secondary | ICD-10-CM | POA: Diagnosis not present

## 2015-10-28 DIAGNOSIS — M810 Age-related osteoporosis without current pathological fracture: Secondary | ICD-10-CM | POA: Diagnosis not present

## 2015-10-28 DIAGNOSIS — R1310 Dysphagia, unspecified: Secondary | ICD-10-CM | POA: Diagnosis not present

## 2015-10-28 DIAGNOSIS — Z96652 Presence of left artificial knee joint: Secondary | ICD-10-CM | POA: Diagnosis not present

## 2015-10-31 DIAGNOSIS — Z96652 Presence of left artificial knee joint: Secondary | ICD-10-CM | POA: Diagnosis not present

## 2015-10-31 DIAGNOSIS — Z471 Aftercare following joint replacement surgery: Secondary | ICD-10-CM | POA: Diagnosis not present

## 2015-10-31 DIAGNOSIS — E43 Unspecified severe protein-calorie malnutrition: Secondary | ICD-10-CM | POA: Diagnosis not present

## 2015-10-31 DIAGNOSIS — R1310 Dysphagia, unspecified: Secondary | ICD-10-CM | POA: Diagnosis not present

## 2015-10-31 DIAGNOSIS — Z7984 Long term (current) use of oral hypoglycemic drugs: Secondary | ICD-10-CM | POA: Diagnosis not present

## 2015-10-31 DIAGNOSIS — M1991 Primary osteoarthritis, unspecified site: Secondary | ICD-10-CM | POA: Diagnosis not present

## 2015-10-31 DIAGNOSIS — M810 Age-related osteoporosis without current pathological fracture: Secondary | ICD-10-CM | POA: Diagnosis not present

## 2015-10-31 DIAGNOSIS — I10 Essential (primary) hypertension: Secondary | ICD-10-CM | POA: Diagnosis not present

## 2015-10-31 DIAGNOSIS — E1142 Type 2 diabetes mellitus with diabetic polyneuropathy: Secondary | ICD-10-CM | POA: Diagnosis not present

## 2015-10-31 DIAGNOSIS — Z7982 Long term (current) use of aspirin: Secondary | ICD-10-CM | POA: Diagnosis not present

## 2015-11-10 ENCOUNTER — Ambulatory Visit: Payer: Medicare Other | Attending: Orthopaedic Surgery | Admitting: Physical Therapy

## 2015-11-10 DIAGNOSIS — M25662 Stiffness of left knee, not elsewhere classified: Secondary | ICD-10-CM | POA: Diagnosis not present

## 2015-11-10 DIAGNOSIS — M25562 Pain in left knee: Secondary | ICD-10-CM | POA: Diagnosis not present

## 2015-11-10 NOTE — Therapy (Signed)
[facility] [place], [place], [address] Phone: [phone]   Fax:  [phone]  Physical Therapy Evaluation  Patient Details  Name: Lisa Sutton MRN: [id number] Date of Birth: Oct 09, 1933 Referring Provider: Rodell Perna MD.  Encounter Date: 11/10/2015      PT End of Session - 11/10/15 1142    Visit Number 1   Number of Visits 16   Date for PT Re-Evaluation 01/09/16   PT Start Time 0947   PT Stop Time 1047   PT Time Calculation (min) 60 min      Past Medical History  Diagnosis Date  . Type 2 diabetes mellitus ([place])   . Essential hypertension, benign   . GERD (gastroesophageal reflux disease)   . Osteoporosis   . Obesity   . Hypothyroidism   . Dysphagia     'sometimes but not a major issue' been checked out by GI (per pt)  . Fatty liver   . Kidney stone August 2014    Patient was seen at [facility]  . PONV (postoperative nausea and vomiting)   . Dysrhythmia     'heart used to skip but doesn't anymore' was checked out by Dr. Ron Parker late '80s, everything checked out ok and not had any skipping since (all per pt)  . Hepatitis B     had at age 82, 'GI doc said it's gone away'  . History of hiatal hernia   . Arthritis   . Cataract   . Hypercalcemia   . Anxiety   . Hepatitis B surface antigen positive     Past Surgical History  Procedure Laterality Date  . Abdominal hysterectomy    . Deviated septum repair    . Knee arthroscopy      left knee 2003  . Cataract extraction, bilateral    . Bilateral carpal tunnel release      Dr. Marlou Sa at surgical center  . Eye surgery    . Knee arthroplasty Left 09/15/2015    Procedure: COMPUTER ASSISTED TOTAL KNEE ARTHROPLASTY;  Surgeon: Marybelle Killings, MD;  Location: [facility];  Service: Orthopedics;  Laterality: Left;    There were no vitals filed for this visit.       Subjective Assessment - 11/10/15 1158    Subjective Doctor is pleased but he wants me to get my knee  straight.   Limitations Walking   How long can you walk comfortably? Community distances.   Currently in Pain? Yes   Pain Score 6    Pain Location Knee   Pain Orientation Left   Pain Descriptors / Indicators Sore   Pain Type Surgical pain   Pain Onset More than a month ago   Pain Frequency Constant            [facility] PT Assessment - 11/10/15 0001    Assessment   Medical Diagnosis Left total kne replacement.   Referring Provider Rodell Perna MD.   Onset Date/Surgical Date --  09/15/15 (surgery date).   Precautions   Precautions --  No ultrasound.   Restrictions   Weight Bearing Restrictions No   Balance Screen   Has the patient fallen in the past 6 months No   Has the patient had a decrease in activity level because of a fear of falling?  Yes   Is the patient reluctant to leave their home because of a fear of falling?  No   Home Ecologist residence   Prior  Function   Level of Independence Independent   Observation/Other Assessments   Observations Left knee and incision loks great.   Observation/Other Assessments-Edema    Edema Circumferential   Circumferential Edema   Circumferential - Left  Left knee 7 cms > right at mid-patellar position.   ROM / Strength   AROM / PROM / Strength AROM;PROM;Strength   AROM   Overall AROM Comments -15 degrees to 115 degrees.   PROM   Overall PROM Comments -12 degrees to 120 degrees.   Strength   Overall Strength Comments Decreased volitional activation of patient's left quadriceps.   Palpation   Palpation comment "Sore" all around the patient's left knee anteriorly.   Ambulation/Gait   Gait Pattern Decreased stance time - left;Antalgic                   [facility] Adult PT Treatment/Exercise - 11/10/15 0001    Exercises   Exercises Knee/Hip   Knee/Hip Exercises: Supine   Quad Sets Limitations 15 minutes non-resisted facilitated with VMS to medial quadriceps (10 sec extension holds and 10 sec  rest)   Modalities   Modalities Vasopneumatic   Vasopneumatic   Number Minutes Vasopneumatic  10 minutes   Vasopnuematic Location  --  Left knee.   Vasopneumatic Pressure Medium                  PT Short Term Goals - 11/10/15 1352    PT SHORT TERM GOAL #1   Title Ind with a HEP.   Time 2   Period Weeks   Status New           PT Long Term Goals - 11/10/15 1352    PT LONG TERM GOAL #1   Title Full left active knee extension in order to normalize gait.   Time 8   Period Weeks   Status New   PT LONG TERM GOAL #2   Title Active knee flexion to 120-125 degrees+ so the patient can perform functional tasks and do so with pain not > 2-3/10.   Time 8   Period Weeks   Status New   PT LONG TERM GOAL #3   Title Increase left knee strength to a solid 5/5 to provide good stability for accomplishment of functional activities   Time 8   Period Weeks   Status New   PT LONG TERM GOAL #4   Title Perform a reciprocating stair gait with one railing with pain not > 2-3/10.   Time 8   Period Weeks   Status New   PT LONG TERM GOAL #5   Title Decrease edema to within 3 cms of non-affected side to assist with pain reduction and range of motion gains.   Time 8   Period Weeks   Status New               Plan - 11/10/15 1315    Clinical Impression Statement The patient underwent a left total knee replacement surgery on 09/15/15.  She is plkeased with her progress thus far and feels she did well with her home health sessions.  She rates her pain at a 6/10 which she describes as 'sore".  Her main deficit is a lack of left knee extension and decreased left quadriceps activation.  She reports her pain increases with lots of walking. and decreases with pain medication.   Rehab Potential Excellent   PT Frequency 2x / week   PT Duration 8 weeks   PT  Treatment/Interventions ADLs/Self Care Home Management;Cryotherapy;Electrical Stimulation;Therapeutic exercise;Therapeutic  activities;Gait training;Patient/family education;Manual techniques;Passive range of motion   PT Next Visit Plan VMS to left quadriceps; extension stretching; stationary bike; strengthening exercises; vasopneumatic.   Consulted and Agree with Plan of Care Patient      Patient will benefit from skilled therapeutic intervention in order to improve the following deficits and impairments:  Decreased activity tolerance, Pain, Decreased range of motion, Decreased strength, Abnormal gait  Visit Diagnosis: Pain in left knee - Plan: PT plan of care cert/re-cert  Stiffness of left knee, not elsewhere classified - Plan: PT plan of care cert/re-cert      G-Codes - 0000000 [phone]    Functional Assessment Tool Used FOTO....56% limitation.   Functional Limitation Mobility: Walking and moving around   Mobility: Walking and Moving Around Current Status [phone]) At least 40 percent but less than 60 percent impaired, limited or restricted   Mobility: Walking and Moving Around Goal Status [phone]) At least 1 percent but less than 20 percent impaired, limited or restricted       Problem List Patient Active Problem List   Diagnosis Date Noted  . Status post total left knee replacement 09/15/2015  . Statin intolerance 09/11/2015  . Peripheral neuropathy ([place]) 07/30/2015  . Abnormal chest x-ray 11/27/2014  . Protein-calorie malnutrition, severe ([place]) 11/27/2014  . AKI (acute kidney injury) ([place]) 11/26/2014  . Hypercalcemia 11/26/2014  . Leukocytosis 11/26/2014  . Weight loss 11/26/2014  . Weakness 11/26/2014  . Insomnia 04/12/2013  . Hyperlipidemia 04/12/2013  . Essential hypertension, benign 10/13/2012  . GERD (gastroesophageal reflux disease) 04/25/2012  . Fatty liver 08/09/2011  . Hepatitis B 08/09/2011  . Dysphagia 08/09/2011  . Type 2 diabetes mellitus ([place]) 08/09/2011  . Hypothyroid 08/09/2011    Ramiyah Mcclenahan, [place] MPT 11/10/2015, 2:00 PM  [facility] [address] [place], [place], [address] Phone: [phone]   Fax:  [phone]  Name: AMAURIE CHAMBERLAND MRN: [id number] Date of Birth: 03-Jun-1933

## 2015-11-12 ENCOUNTER — Ambulatory Visit: Payer: Medicare Other | Admitting: Physical Therapy

## 2015-11-12 ENCOUNTER — Encounter: Payer: Self-pay | Admitting: Physical Therapy

## 2015-11-12 DIAGNOSIS — M25662 Stiffness of left knee, not elsewhere classified: Secondary | ICD-10-CM | POA: Diagnosis not present

## 2015-11-12 DIAGNOSIS — M25562 Pain in left knee: Secondary | ICD-10-CM | POA: Diagnosis not present

## 2015-11-12 NOTE — Therapy (Signed)
[facility] [place], [place], [address] Phone: [phone]   Fax:  [phone]  Physical Therapy Treatment  Patient Details  Name: Lisa Sutton MRN: [id number] Date of Birth: [birth year] Referring Provider: Rodell Perna MD.  Encounter Date: 11/12/2015      PT End of Session - 11/12/15 0934    Visit Number 2   Number of Visits 16   Date for PT Re-Evaluation 01/09/16   PT Start Time 0900   PT Stop Time 0957   PT Time Calculation (min) 57 min   Activity Tolerance Patient tolerated treatment well   Behavior During Therapy [facility] for tasks assessed/performed      Past Medical History  Diagnosis Date  . Type 2 diabetes mellitus ([place])   . Essential hypertension, benign   . GERD (gastroesophageal reflux disease)   . Osteoporosis   . Obesity   . Hypothyroidism   . Dysphagia     'sometimes but not a major issue' been checked out by GI (per pt)  . Fatty liver   . Kidney stone August 2014    Patient was [facility]  . PONV (postoperative nausea and vomiting)   . Dysrhythmia     'heart used to skip but doesn't anymore' was checked out by Dr. Ron Parker late '90s, everything checked out ok and not had any skipping since (all per pt)  . Hepatitis B     had at age 56, 'GI doc said it's gone away'  . History of hiatal hernia   . Arthritis   . Cataract   . Hypercalcemia   . Anxiety   . Hepatitis B surface antigen positive     Past Surgical History  Procedure Laterality Date  . Abdominal hysterectomy    . Deviated septum repair    . Knee arthroscopy      left knee 2003  . Cataract extraction, bilateral    . Bilateral carpal tunnel release      Dr. Marlou Sa at surgical center  . Eye surgery    . Knee arthroplasty Left 09/15/2015    Procedure: COMPUTER ASSISTED TOTAL KNEE ARTHROPLASTY;  Surgeon: Marybelle Killings, MD;  Location: [facility];  Service: Orthopedics;  Laterality: Left;    There were no vitals filed for this visit.       Subjective Assessment - 11/12/15 0904    Subjective Patient reported some soreness today in knee   Limitations Walking   How long can you walk comfortably? Community distances.   Currently in Pain? Yes   Pain Score 5    Pain Location Knee   Pain Orientation Left   Pain Descriptors / Indicators Sore   Pain Type Surgical pain   Pain Onset More than a month ago   Pain Frequency Constant   Aggravating Factors  prolong activity   Pain Relieving Factors at rest            [facility] PT Assessment - 11/12/15 0001    ROM / Strength   AROM / PROM / Strength PROM;AROM   AROM   AROM Assessment Site Knee   Right/Left Knee Left   Left Knee Extension -15   Left Knee Flexion 115   PROM   PROM Assessment Site Knee   Right/Left Knee Left   Left Knee Extension -10   Left Knee Flexion 120                     [facility] Adult PT  Treatment/Exercise - 11/12/15 0001    Knee/Hip Exercises: Aerobic   Nustep x32min L4    Knee/Hip Exercises: Standing   Forward Step Up Left;2 sets;10 reps;Step Height: 6"   Rocker Board 2 minutes   Modalities   Modalities Designer, multimedia Location left VMO/quad   Chartered certified accountant VMS   Electrical Stimulation Parameters 10x10x74min   Electrical Stimulation Goals Neuromuscular facilitation;Strength   Vasopneumatic   Number Minutes Vasopneumatic  15 minutes   Vasopnuematic Location  Knee   Vasopneumatic Pressure Medium   Manual Therapy   Manual Therapy Passive ROM   Passive ROM gentle stretching for left knee flexion and esp ext with low load holds                PT Education - 11/12/15 0934    Education provided Yes   Education Details HEP ROM   Person(s) Educated Patient   Methods Explanation;Demonstration;Handout   Comprehension Verbalized understanding;Returned demonstration          PT Short Term Goals - 11/12/15 0936    PT SHORT TERM GOAL #1    Title Ind with a HEP.   Time 2   Period Weeks   Status Achieved           PT Long Term Goals - 11/12/15 0936    PT LONG TERM GOAL #1   Title Full left active knee extension in order to normalize gait.   Time 8   Period Weeks   Status On-going   PT LONG TERM GOAL #2   Title Active knee flexion to 120-125 degrees+ so the patient can perform functional tasks and do so with pain not > 2-3/10.   Time 8   Period Weeks   Status On-going   PT LONG TERM GOAL #3   Title Increase left knee strength to a solid 5/5 to provide good stability for accomplishment of functional activities   Time 8   Period Weeks   Status On-going   PT LONG TERM GOAL #4   Title Perform a reciprocating stair gait with one railing with pain not > 2-3/10.   Time 8   Period Weeks   Status On-going   PT LONG TERM GOAL #5   Title Decrease edema to within 3 cms of non-affected side to assist with pain reduction and range of motion gains.   Time 8   Period Weeks   Status On-going               Plan - 11/12/15 [id number]    Clinical Impression Statement Patient progressing with all activities today. HEP given for self stretching/ROM. Patient understands all exercises and to perform daily to inprove ROM and functional independence. Patient tolerated treatment well today. STG #13met today, other LTG's ongoing at this tme due to ROM, strength, edema and pain deficits.   Rehab Potential Excellent   PT Frequency 2x / week   PT Duration 8 weeks   PT Treatment/Interventions ADLs/Self Care Home Management;Cryotherapy;Electrical Stimulation;Therapeutic exercise;Therapeutic activities;Gait training;Patient/family education;Manual techniques;Passive range of motion   PT Next Visit Plan VMS to left quadriceps; extension stretching; stationary bike; strengthening exercises; vasopneumatic.   Consulted and Agree with Plan of Care Patient      Patient will benefit from skilled therapeutic intervention in order to improve  the following deficits and impairments:  Decreased activity tolerance, Pain, Decreased range of motion, Decreased strength, Abnormal gait  Visit Diagnosis: Pain in left knee  Stiffness  of left knee, not elsewhere classified     Problem List Patient Active Problem List   Diagnosis Date Noted  . Status post total left knee replacement 09/15/2015  . Statin intolerance 09/11/2015  . Peripheral neuropathy ([place]) 07/30/2015  . Abnormal chest x-ray 11/27/2014  . Protein-calorie malnutrition, severe ([place]) 11/27/2014  . AKI (acute kidney injury) ([place]) 11/26/2014  . Hypercalcemia 11/26/2014  . Leukocytosis 11/26/2014  . Weight loss 11/26/2014  . Weakness 11/26/2014  . Insomnia 04/12/2013  . Hyperlipidemia 04/12/2013  . Essential hypertension, benign 10/13/2012  . GERD (gastroesophageal reflux disease) 04/25/2012  . Fatty liver 08/09/2011  . Hepatitis B 08/09/2011  . Dysphagia 08/09/2011  . Type 2 diabetes mellitus ([place]) 08/09/2011  . Hypothyroid 08/09/2011    Axil Copeman P, PTA 11/12/2015, 10:03 AM  [facility] [place], [place], [address] Phone: [phone]   Fax:  [phone]  Name: Lisa Sutton MRN: [id number] Date of Birth: 1933/08/12

## 2015-11-12 NOTE — Patient Instructions (Signed)
   Knee Flexion Stretch on Step  Place foot on step and lean forward until you feel a good stretch in front of knee.   hold 30 sec x 5-10 perform 2-4 x daily  Knee Extension Mobilization: Towel Prop   With rolled towel under right ankle, place _1-5___ pound weight across knee. Hold __5+__ minutes. Repeat __2-3__ times per set. Do __2__ sets per session. Do __2-4__ sessions per day.   KNEE: Knee Hang - Prone   Lie on stomach. Place towel above knee; hang feet off surface. Keep feet straight. Hold _60__ seconds. _5__ reps per set, __2-4_ sets per day Add _0+__ lb weights to ankles.  Sit and Reach - Foot Supported   Place one foot on table. Straighten leg and attempt to keep it straight, then push down until feel a stretch. Hold _30__ seconds. Repeat __5-10_ times each leg, alternating. Do _2-4__ sessions per day.   Copyright  VHI. All rights reserved.      

## 2015-11-17 ENCOUNTER — Encounter: Payer: Self-pay | Admitting: Physical Therapy

## 2015-11-17 ENCOUNTER — Ambulatory Visit: Payer: Medicare Other | Admitting: Physical Therapy

## 2015-11-17 DIAGNOSIS — M25562 Pain in left knee: Secondary | ICD-10-CM | POA: Diagnosis not present

## 2015-11-17 DIAGNOSIS — M25662 Stiffness of left knee, not elsewhere classified: Secondary | ICD-10-CM

## 2015-11-17 NOTE — Therapy (Signed)
[facility] [place], [place], [address] Phone: [phone]   Fax:  [phone]  Physical Therapy Treatment  Patient Details  Name: Lisa Sutton MRN: [id number] Date of Birth: 09-07-1933 Referring Provider: Rodell Perna MD.  Encounter Date: 11/17/2015      PT End of Session - 11/17/15 0931    Visit Number 3   Number of Visits 16   Date for PT Re-Evaluation 01/09/16   PT Start Time 0859   PT Stop Time 0957   PT Time Calculation (min) 58 min   Activity Tolerance Patient tolerated treatment well   Behavior During Therapy [facility] for tasks assessed/performed      Past Medical History  Diagnosis Date  . Type 2 diabetes mellitus ([place])   . Essential hypertension, benign   . GERD (gastroesophageal reflux disease)   . Osteoporosis   . Obesity   . Hypothyroidism   . Dysphagia     'sometimes but not a major issue' been checked out by GI (per pt)  . Fatty liver   . Kidney stone August 2014    Patient was seen at [facility]  . PONV (postoperative nausea and vomiting)   . Dysrhythmia     'heart used to skip but doesn't anymore' was checked out by Dr. Ron Parker late '90s, everything checked out ok and not had any skipping since (all per pt)  . Hepatitis B     had at age 34, 'GI doc said it's gone away'  . History of hiatal hernia   . Arthritis   . Cataract   . Hypercalcemia   . Anxiety   . Hepatitis B surface antigen positive     Past Surgical History  Procedure Laterality Date  . Abdominal hysterectomy    . Deviated septum repair    . Knee arthroscopy      left knee 2003  . Cataract extraction, bilateral    . Bilateral carpal tunnel release      Dr. Marlou Sa at surgical center  . Eye surgery    . Knee arthroplasty Left 09/15/2015    Procedure: COMPUTER ASSISTED TOTAL KNEE ARTHROPLASTY;  Surgeon: Marybelle Killings, MD;  Location: [facility];  Service: Orthopedics;  Laterality: Left;    There were no vitals filed for this visit.       Subjective Assessment - 11/17/15 0908    Subjective Patient reported knee feeling some better just a little sore and stiff today   Limitations Walking   How long can you walk comfortably? Community distances.   Currently in Pain? Yes   Pain Score 5    Pain Location Knee   Pain Orientation Left   Pain Descriptors / Indicators Sore   Pain Type Surgical pain   Pain Onset More than a month ago   Pain Frequency Constant   Aggravating Factors  prolong activity   Pain Relieving Factors at rest            [facility] PT Assessment - 11/17/15 0001    AROM   AROM Assessment Site Knee   Right/Left Knee Left   Left Knee Extension -14   Left Knee Flexion 115   PROM   PROM Assessment Site Knee   Right/Left Knee Left   Left Knee Extension -9   Left Knee Flexion 120                     [facility] Adult PT Treatment/Exercise - 11/17/15 0001  Knee/Hip Exercises: Aerobic   Nustep 58min L4 ROM/strength   Knee/Hip Exercises: Standing   Forward Step Up Left;10 reps;Step Height: 6";3 sets   Rocker Board 3 minutes   Electrical Stimulation   Electrical Stimulation Location left VMO/quad   Electrical Stimulation Action VMS    Electrical Stimulation Parameters 10x10 x10 min with SAQ for activation   Electrical Stimulation Goals Neuromuscular facilitation;Strength   Vasopneumatic   Number Minutes Vasopneumatic  15 minutes   Vasopnuematic Location  Knee   Vasopneumatic Pressure Medium   Manual Therapy   Manual Therapy Passive ROM   Passive ROM gentle stretching for left knee flexion and esp ext with low load holds                  PT Short Term Goals - 11/12/15 0936    PT SHORT TERM GOAL #1   Title Ind with a HEP.   Time 2   Period Weeks   Status Achieved           PT Long Term Goals - 11/12/15 0936    PT LONG TERM GOAL #1   Title Full left active knee extension in order to normalize gait.   Time 8   Period Weeks   Status On-going   PT LONG TERM GOAL #2    Title Active knee flexion to 120-125 degrees+ so the patient can perform functional tasks and do so with pain not > 2-3/10.   Time 8   Period Weeks   Status On-going   PT LONG TERM GOAL #3   Title Increase left knee strength to a solid 5/5 to provide good stability for accomplishment of functional activities   Time 8   Period Weeks   Status On-going   PT LONG TERM GOAL #4   Title Perform a reciprocating stair gait with one railing with pain not > 2-3/10.   Time 8   Period Weeks   Status On-going   PT LONG TERM GOAL #5   Title Decrease edema to within 3 cms of non-affected side to assist with pain reduction and range of motion gains.   Time 8   Period Weeks   Status On-going               Plan - 11/17/15 [id number]    Clinical Impression Statement Patient progressing with slight improvement with ROM in left knee ext today. Patient has reported inprovement with not alot of pain yet soreness with prolong activity or ROM. Patient has improved with ADL's yet not back 100% with all activities. Goals ongoing due to strength, edema, pain and ROM deficits.   Rehab Potential Excellent   PT Frequency 2x / week   PT Duration 8 weeks   PT Treatment/Interventions ADLs/Self Care Home Management;Cryotherapy;Electrical Stimulation;Therapeutic exercise;Therapeutic activities;Gait training;Patient/family education;Manual techniques;Passive range of motion   PT Next Visit Plan VMS to left quadriceps; extension stretching; stationary bike; strengthening exercises; vasopneumatic.   Consulted and Agree with Plan of Care Patient      Patient will benefit from skilled therapeutic intervention in order to improve the following deficits and impairments:  Decreased activity tolerance, Pain, Decreased range of motion, Decreased strength, Abnormal gait  Visit Diagnosis: Pain in left knee  Stiffness of left knee, not elsewhere classified     Problem List Patient Active Problem List   Diagnosis Date  Noted  . Status post total left knee replacement 09/15/2015  . Statin intolerance 09/11/2015  . Peripheral neuropathy ([place]) 07/30/2015  . Abnormal  chest x-ray 11/27/2014  . Protein-calorie malnutrition, severe ([place]) 11/27/2014  . AKI (acute kidney injury) ([place]) 11/26/2014  . Hypercalcemia 11/26/2014  . Leukocytosis 11/26/2014  . Weight loss 11/26/2014  . Weakness 11/26/2014  . Insomnia 04/12/2013  . Hyperlipidemia 04/12/2013  . Essential hypertension, benign 10/13/2012  . GERD (gastroesophageal reflux disease) 04/25/2012  . Fatty liver 08/09/2011  . Hepatitis B 08/09/2011  . Dysphagia 08/09/2011  . Type 2 diabetes mellitus ([place]) 08/09/2011  . Hypothyroid 08/09/2011    DUNFORD, CHRISTINA P, PTA 11/17/2015, 10:00 AM  [facility] [place], [place], [address] Phone: [phone]   Fax:  [phone]  Name: Lisa Sutton MRN: [id number] Date of Birth: Aug 13, 1933

## 2015-11-19 ENCOUNTER — Ambulatory Visit: Payer: Medicare Other | Admitting: Physical Therapy

## 2015-11-19 ENCOUNTER — Encounter: Payer: Self-pay | Admitting: Physical Therapy

## 2015-11-19 DIAGNOSIS — M25562 Pain in left knee: Secondary | ICD-10-CM | POA: Diagnosis not present

## 2015-11-19 DIAGNOSIS — M25662 Stiffness of left knee, not elsewhere classified: Secondary | ICD-10-CM | POA: Diagnosis not present

## 2015-11-19 NOTE — Therapy (Signed)
[facility] [place], [place], [address] Phone: [phone]   Fax:  [phone]  Physical Therapy Treatment  Patient Details  Name: Lisa Sutton MRN: [id number] Date of Birth: [birth year] Referring Provider: Rodell Perna MD.  Encounter Date: 11/19/2015      PT End of Session - 11/19/15 0935    Visit Number 4   Number of Visits 16   Date for PT Re-Evaluation 01/09/16   PT Start Time 0900   PT Stop Time 0959   PT Time Calculation (min) 59 min   Activity Tolerance Patient tolerated treatment well   Behavior During Therapy [facility] for tasks assessed/performed      Past Medical History  Diagnosis Date  . Type 2 diabetes mellitus ([place])   . Essential hypertension, benign   . GERD (gastroesophageal reflux disease)   . Osteoporosis   . Obesity   . Hypothyroidism   . Dysphagia     'sometimes but not a major issue' been checked out by GI (per pt)  . Fatty liver   . Kidney stone August 2014    Patient was seen at [facility]  . PONV (postoperative nausea and vomiting)   . Dysrhythmia     'heart used to skip but doesn't anymore' was checked out by Dr. Ron Parker late '90s, everything checked out ok and not had any skipping since (all per pt)  . Hepatitis B     had at age 34, 'GI doc said it's gone away'  . History of hiatal hernia   . Arthritis   . Cataract   . Hypercalcemia   . Anxiety   . Hepatitis B surface antigen positive     Past Surgical History  Procedure Laterality Date  . Abdominal hysterectomy    . Deviated septum repair    . Knee arthroscopy      left knee 2003  . Cataract extraction, bilateral    . Bilateral carpal tunnel release      Dr. Marlou Sa at surgical center  . Eye surgery    . Knee arthroplasty Left 09/15/2015    Procedure: COMPUTER ASSISTED TOTAL KNEE ARTHROPLASTY;  Surgeon: Marybelle Killings, MD;  Location: [facility];  Service: Orthopedics;  Laterality: Left;    There were no vitals filed for this visit.       Subjective Assessment - 11/19/15 0908    Subjective Patient reported knee feeling some better just a little sore and stiff today"about the same"   Limitations Walking   How long can you walk comfortably? Community distances.   Currently in Pain? Yes   Pain Score 5    Pain Location Knee   Pain Orientation Left   Pain Descriptors / Indicators Sore   Pain Type Surgical pain   Pain Onset More than a month ago   Pain Frequency Constant   Aggravating Factors  increased activity   Pain Relieving Factors at rest            [facility] PT Assessment - 11/19/15 0001    AROM   AROM Assessment Site Knee   Right/Left Knee Left   Left Knee Extension -14   Left Knee Flexion 120   PROM   PROM Assessment Site Knee   Right/Left Knee Left   Left Knee Extension -9   Left Knee Flexion 125                     [facility] Adult PT Treatment/Exercise - 11/19/15 0001  Knee/Hip Exercises: Aerobic   Nustep 79mn L4 ROM/strength   Knee/Hip Exercises: Standing   Forward Step Up Left;10 reps;Step Height: 6";3 sets   Rocker Board 3 minutes   Electrical Stimulation   Electrical Stimulation Location left VMO/quad   Electrical Stimulation Action VMSVMS   Electrical Stimulation Parameters 10x10 x144m with SAQ for activation   Electrical Stimulation Goals Neuromuscular facilitation;Strength   Vasopneumatic   Number Minutes Vasopneumatic  15 minutes   Vasopnuematic Location  Knee   Vasopneumatic Pressure Medium   Manual Therapy   Manual Therapy Passive ROM   Passive ROM gentle stretching for left knee flexion and esp ext with low load holds                  PT Short Term Goals - 11/12/15 0936    PT SHORT TERM GOAL #1   Title Ind with a HEP.   Time 2   Period Weeks   Status Achieved           PT Long Term Goals - 11/19/15 0936    PT LONG TERM GOAL #1   Title Full left active knee extension in order to normalize gait.   Time 8   Period Weeks   Status On-going  AROM  -14 degrees 11/19/15   PT LONG TERM GOAL #2   Title Active knee flexion to 120-125 degrees+ so the patient can perform functional tasks and do so with pain not > 2-3/10.   Time 8   Period Weeks   Status Achieved  AROM 120 degrees 11/19/15   PT LONG TERM GOAL #3   Title Increase left knee strength to a solid 5/5 to provide good stability for accomplishment of functional activities   Time 8   Period Weeks   Status On-going   PT LONG TERM GOAL #4   Title Perform a reciprocating stair gait with one railing with pain not > 2-3/10.   Time 8   Period Weeks   Status On-going   PT LONG TERM GOAL #5   Title Decrease edema to within 3 cms of non-affected side to assist with pain reduction and range of motion gains.   Time 8   Period Weeks   Status On-going               Plan - 11/19/15 [id number]  Clinical Impression Statement Patient progressing with all activities today. Patient has improved flexion active and passive ROM, ext goal ongoing at this time. Patient reported not doing a lot of self ROM. Educated patient on continued daily self stretches to improve ROM and functional independece. Patient met LTG #2 today, oter goalds ongoing due to pain, strength and ext ROM deficits.   Rehab Potential Excellent   PT Frequency 2x / week   PT Duration 8 weeks   PT Treatment/Interventions ADLs/Self Care Home Management;Cryotherapy;Electrical Stimulation;Therapeutic exercise;Therapeutic activities;Gait training;Patient/family education;Manual techniques;Passive range of motion   PT Next Visit Plan cont with POC for VMS to left quadriceps; extension stretching; stationary bike; strengthening exercises; vasopneumatic. (MD. YaLorin Mercy/19/17)   Consulted and Agree with Plan of Care Patient      Patient will benefit from skilled therapeutic intervention in order to improve the following deficits and impairments:  Decreased activity tolerance, Pain, Decreased range of motion, Decreased strength, Abnormal  gait  Visit Diagnosis: Pain in left knee  Stiffness of left knee, not elsewhere classified     Problem List Patient Active Problem List   Diagnosis Date Noted  .  Status post total left knee replacement 09/15/2015  . Statin intolerance 09/11/2015  . Peripheral neuropathy ([place]) 07/30/2015  . Abnormal chest x-ray 11/27/2014  . Protein-calorie malnutrition, severe ([place]) 11/27/2014  . AKI (acute kidney injury) ([place]) 11/26/2014  . Hypercalcemia 11/26/2014  . Leukocytosis 11/26/2014  . Weight loss 11/26/2014  . Weakness 11/26/2014  . Insomnia 04/12/2013  . Hyperlipidemia 04/12/2013  . Essential hypertension, benign 10/13/2012  . GERD (gastroesophageal reflux disease) 04/25/2012  . Fatty liver 08/09/2011  . Hepatitis B 08/09/2011  . Dysphagia 08/09/2011  . Type 2 diabetes mellitus ([place]) 08/09/2011  . Hypothyroid 08/09/2011    Javed Cotto P, PTA 11/19/2015, 9:59 AM  [facility] [place], [place], [address] Phone: [phone]   Fax:  [phone]  Name: Lisa Sutton MRN: [id number] Date of Birth: 1934/04/30

## 2015-11-24 ENCOUNTER — Ambulatory Visit: Payer: Medicare Other | Attending: Orthopaedic Surgery | Admitting: *Deleted

## 2015-11-24 DIAGNOSIS — M25562 Pain in left knee: Secondary | ICD-10-CM

## 2015-11-24 DIAGNOSIS — M25662 Stiffness of left knee, not elsewhere classified: Secondary | ICD-10-CM | POA: Insufficient documentation

## 2015-11-24 NOTE — Therapy (Signed)
[facility] [place], [place], [address] Phone: [phone]   Fax:  [phone]  Physical Therapy Treatment  Patient Details  Name: Lisa Sutton MRN: [id number] Date of Birth: June 28, 1933 Referring Provider: Rodell Perna MD.  Encounter Date: 11/24/2015      PT End of Session - 11/24/15 0915    Visit Number 5   Number of Visits 16   Date for PT Re-Evaluation 01/09/16   PT Start Time 0900   PT Stop Time 1000   PT Time Calculation (min) 60 min      Past Medical History  Diagnosis Date  . Type 2 diabetes mellitus ([place])   . Essential hypertension, benign   . GERD (gastroesophageal reflux disease)   . Osteoporosis   . Obesity   . Hypothyroidism   . Dysphagia     'sometimes but not a major issue' been checked out by GI (per pt)  . Fatty liver   . Kidney stone August 2014    Patient was seen at [facility]  . PONV (postoperative nausea and vomiting)   . Dysrhythmia     'heart used to skip but doesn't anymore' was checked out by Dr. Ron Sutton late '90s, everything checked out ok and not had any skipping since (all per pt)  . Hepatitis B     had at age 30, 'GI doc said it's gone away'  . History of hiatal hernia   . Arthritis   . Cataract   . Hypercalcemia   . Anxiety   . Hepatitis B surface antigen positive     Past Surgical History  Procedure Laterality Date  . Abdominal hysterectomy    . Deviated septum repair    . Knee arthroscopy      left knee 2003  . Cataract extraction, bilateral    . Bilateral carpal tunnel release      Dr. Marlou Sutton at surgical center  . Eye surgery    . Knee arthroplasty Left 09/15/2015    Procedure: COMPUTER ASSISTED TOTAL KNEE ARTHROPLASTY;  Surgeon: Lisa Killings, MD;  Location: [facility];  Service: Orthopedics;  Laterality: Left;    There were no vitals filed for this visit.      Subjective Assessment - 11/24/15 0914    Subjective Patient reported knee feeling some better just a little  sore and stiff today"about the same"  Knee straightening is the problem   Limitations Walking   How long can you walk comfortably? Community distances.   Currently in Pain? Yes   Pain Score 5    Pain Location Knee   Pain Orientation Left   Pain Descriptors / Indicators Sore   Pain Type Surgical pain   Pain Onset More than a month ago   Pain Frequency Constant                         [facility] Adult PT Treatment/Exercise - 11/24/15 0001    Exercises   Exercises Knee/Hip   Knee/Hip Exercises: Aerobic   Nustep 32min L4 ROM/strength seat 7,6   Knee/Hip Exercises: Standing   Forward Step Up Left;10 reps;Step Height: 6";3 sets   Rocker Board 3 minutes  calf stretching   Modalities   Modalities Electrical Stimulation;Vasopneumatic   Electrical Stimulation   Electrical Stimulation Location Premod x 15 mins at 1-10 hz for swelling   Electrical Stimulation Goals Edema   Vasopneumatic   Number Minutes Vasopneumatic  15 minutes   Vasopnuematic Location  Knee   Vasopneumatic Pressure Medium   Manual Therapy   Manual Therapy Passive ROM   Passive ROM gentle stretching for left knee flexion and esp ext with low load holds and STW to posterior aspectin prone                  PT Short Term Goals - 11/12/15 0936    PT SHORT TERM GOAL #1   Title Ind with a HEP.   Time 2   Period Weeks   Status Achieved           PT Long Term Goals - 11/19/15 0936    PT LONG TERM GOAL #1   Title Full left active knee extension in order to normalize gait.   Time 8   Period Weeks   Status On-going  AROM -14 degrees 11/19/15   PT LONG TERM GOAL #2   Title Active knee flexion to 120-125 degrees+ so the patient can perform functional tasks and do so with pain not > 2-3/10.   Time 8   Period Weeks   Status Achieved  AROM 120 degrees 11/19/15   PT LONG TERM GOAL #3   Title Increase left knee strength to a solid 5/5 to provide good stability for accomplishment of functional  activities   Time 8   Period Weeks   Status On-going   PT LONG TERM GOAL #4   Title Perform a reciprocating stair gait with one railing with pain not > 2-3/10.   Time 8   Period Weeks   Status On-going   PT LONG TERM GOAL #5   Title Decrease edema to within 3 cms of non-affected side to assist with pain reduction and range of motion gains.   Time 8   Period Weeks   Status On-going               Plan - 11/24/15 0915    Clinical Impression Statement Pt did fairly well with Rx today. She was able to perform all exs and Act.'s for LT knee with minimal discomfort. She did well with STW to posterior aspect LT knee while in ext. stretch. Her flexion ROM continues to progress but, still has extension ROM deficits and goals are ongoing. Normal response  from modalities   Rehab Potential Excellent   PT Frequency 2x / week   PT Duration 8 weeks   PT Treatment/Interventions ADLs/Self Care Home Management;Cryotherapy;Electrical Stimulation;Therapeutic exercise;Therapeutic activities;Gait training;Patient/family education;Manual techniques;Passive range of motion   PT Next Visit Plan cont with POC for VMS to left quadriceps; extension stretching; stationary bike; strengthening exercises; vasopneumatic. (MD. Lorin Mercy 12/10/15)   Consulted and Agree with Plan of Care Patient      Patient will benefit from skilled therapeutic intervention in order to improve the following deficits and impairments:  Decreased activity tolerance, Pain, Decreased range of motion, Decreased strength, Abnormal gait  Visit Diagnosis: Pain in left knee  Stiffness of left knee, not elsewhere classified     Problem List Patient Active Problem List   Diagnosis Date Noted  . Status post total left knee replacement 09/15/2015  . Statin intolerance 09/11/2015  . Peripheral neuropathy ([place]) 07/30/2015  . Abnormal chest x-ray 11/27/2014  . Protein-calorie malnutrition, severe ([place]) 11/27/2014  . AKI (acute kidney  injury) ([place]) 11/26/2014  . Hypercalcemia 11/26/2014  . Leukocytosis 11/26/2014  . Weight loss 11/26/2014  . Weakness 11/26/2014  . Insomnia 04/12/2013  . Hyperlipidemia 04/12/2013  . Essential hypertension, benign 10/13/2012  . GERD (gastroesophageal  reflux disease) 04/25/2012  . Fatty liver 08/09/2011  . Hepatitis B 08/09/2011  . Dysphagia 08/09/2011  . Type 2 diabetes mellitus ([place]) 08/09/2011  . Hypothyroid 08/09/2011    RAMSEUR,CHRIS, PTA 11/24/2015, 1:20 PM  [facility] [address] [place], [place], [address] Phone: [phone]   Fax:  [phone]  Name: Lisa Sutton MRN: [id number] Date of Birth: [birth year]

## 2015-12-01 ENCOUNTER — Encounter: Payer: Self-pay | Admitting: Physical Therapy

## 2015-12-01 ENCOUNTER — Ambulatory Visit: Payer: Medicare Other | Admitting: Physical Therapy

## 2015-12-01 DIAGNOSIS — M25562 Pain in left knee: Secondary | ICD-10-CM

## 2015-12-01 DIAGNOSIS — M25662 Stiffness of left knee, not elsewhere classified: Secondary | ICD-10-CM

## 2015-12-01 NOTE — Therapy (Signed)
[facility] [place], [place], [address] Phone: [phone]   Fax:  [phone]  Physical Therapy Treatment  Patient Details  Name: Lisa Sutton MRN: [id number] Date of Birth: 1933-10-10 Referring Provider: Rodell Perna MD.  Encounter Date: 12/01/2015      PT End of Session - 12/01/15 1117    Visit Number 6   Number of Visits 16   PT Start Time 1115   PT Stop Time 1206   PT Time Calculation (min) 51 min   Activity Tolerance Patient tolerated treatment well   Behavior During Therapy [facility] for tasks assessed/performed      Past Medical History  Diagnosis Date  . Type 2 diabetes mellitus ([place])   . Essential hypertension, benign   . GERD (gastroesophageal reflux disease)   . Osteoporosis   . Obesity   . Hypothyroidism   . Dysphagia     'sometimes but not a major issue' been checked out by GI (per pt)  . Fatty liver   . Kidney stone August 2014    Patient was seen at [facility]  . PONV (postoperative nausea and vomiting)   . Dysrhythmia     'heart used to skip but doesn't anymore' was checked out by Dr. Ron Parker late '90s, everything checked out ok and not had any skipping since (all per pt)  . Hepatitis B     had at age 28, 'GI doc said it's gone away'  . History of hiatal hernia   . Arthritis   . Cataract   . Hypercalcemia   . Anxiety   . Hepatitis B surface antigen positive     Past Surgical History  Procedure Laterality Date  . Abdominal hysterectomy    . Deviated septum repair    . Knee arthroscopy      left knee 2003  . Cataract extraction, bilateral    . Bilateral carpal tunnel release      Dr. Marlou Sa at surgical center  . Eye surgery    . Knee arthroplasty Left 09/15/2015    Procedure: COMPUTER ASSISTED TOTAL KNEE ARTHROPLASTY;  Surgeon: Marybelle Killings, MD;  Location: [place];  Service: Orthopedics;  Laterality: Left;    There were no vitals filed for this visit.      Subjective Assessment - 12/01/15  1116    Subjective Reports that her knee is still sore at this time. Reports no issue with stairs at home that has a railing.   Limitations Walking   How long can you walk comfortably? Community distances.   Currently in Pain? Yes   Pain Score 5    Pain Location Knee   Pain Orientation Left   Pain Descriptors / Indicators Sore   Pain Type Surgical pain   Pain Onset More than a month ago            [facility] PT Assessment - 12/01/15 0001    Assessment   Medical Diagnosis Left total kne replacement.   Onset Date/Surgical Date 09/15/15   Next MD Visit 12/10/2015   Observation/Other Assessments-Edema    Edema Circumferential   Circumferential Edema   Circumferential - Right 36 cm   Circumferential - Left  39 cm   ROM / Strength   AROM / PROM / Strength AROM   AROM   Overall AROM  Deficits;Within functional limits for tasks performed   AROM Assessment Site Knee   Right/Left Knee Left   Left Knee Extension -8   Left Knee Flexion  [facility] Adult PT Treatment/Exercise - 12/01/15 0001    Knee/Hip Exercises: Stretches   Active Hamstring Stretch Left;5 reps;20 seconds   Knee/Hip Exercises: Aerobic   Nustep L5 x17 min for ROM   Knee/Hip Exercises: Standing   Lateral Step Up Left;2 sets;10 reps;Hand Hold: 2;Step Height: 6"   Forward Step Up Left;3 sets;10 reps;Hand Hold: 2;Step Height: 6"   Rocker Board 2 minutes   Knee/Hip Exercises: Seated   Long Arc Quad Strengthening;Left;3 sets;10 reps   Long Arc Quad Weight 3 lbs.   Modalities   Modalities Designer, multimedia Location L knee   Electrical Stimulation Action Pre-Mod   Electrical Stimulation Parameters 80-150 hz x15 min   Electrical Stimulation Goals Pain;Edema   Vasopneumatic   Number Minutes Vasopneumatic  15 minutes   Vasopnuematic Location  Knee   Vasopneumatic Pressure Medium   Vasopneumatic Temperature  34   Manual  Therapy   Manual Therapy Passive ROM;Soft tissue mobilization   Soft tissue mobilization STW to L HS to decrease tightness; L patellar mobilizations and incision mobilizations to promote proper mobility   Passive ROM PROM of L knee into ext with holds at end range                  PT Short Term Goals - 11/12/15 0936    PT SHORT TERM GOAL #1   Title Ind with a HEP.   Time 2   Period Weeks   Status Achieved           PT Long Term Goals - 12/01/15 1151    PT LONG TERM GOAL #1   Title Full left active knee extension in order to normalize gait.   Time 8   Period Weeks   Status On-going  AROM L knee ext -8 deg from neutral 12/01/2015   PT LONG TERM GOAL #2   Title Active knee flexion to 120-125 degrees+ so the patient can perform functional tasks and do so with pain not > 2-3/10.   Time 8   Period Weeks   Status Achieved  AROM L knee flex 125 degrees 12/01/2015   PT LONG TERM GOAL #3   Title Increase left knee strength to a solid 5/5 to provide good stability for accomplishment of functional activities   Time 8   Period Weeks   Status On-going   PT LONG TERM GOAL #4   Title Perform a reciprocating stair gait with one railing with pain not > 2-3/10.   Time 8   Period Weeks   Status On-going   PT LONG TERM GOAL #5   Title Decrease edema to within 3 cms of non-affected side to assist with pain reduction and range of motion gains.   Time 8   Period Weeks   Status Achieved  L knee edema 39 cm, R knee 36 cm as of 12/01/2015               Plan - 12/01/15 1152    Clinical Impression Statement Patient tolerated today's treatment well with no reports of increased L knee pain during today's exercises. Able to complete all exercises with minimal multimodal cueing for proper exercise technique. L knee edema 39 cm, R knee 36 cm thus meeting LT goal for L knee edema. AROM of L knee measured as 8-124 deg in supine today.  Edema continues to present predominately around L  patella and an area just anteriolateral from knee joint. Patient also presented with L HS tightness upon palpation today. Normal modalities response noted following removal of the modalities.   Rehab Potential Excellent   PT Frequency 2x / week   PT Duration 8 weeks   PT Treatment/Interventions ADLs/Self Care Home Management;Cryotherapy;Electrical Stimulation;Therapeutic exercise;Therapeutic activities;Gait training;Patient/family education;Manual techniques;Passive range of motion   PT Next Visit Plan Continue with L knee strengthening with emphasis on improving L knee extension with modalities PRN for pain/edema per MPT POC   Consulted and Agree with Plan of Care Patient      Patient will benefit from skilled therapeutic intervention in order to improve the following deficits and impairments:  Decreased activity tolerance, Pain, Decreased range of motion, Decreased strength, Abnormal gait  Visit Diagnosis: Pain in left knee  Stiffness of left knee, not elsewhere classified     Problem List Patient Active Problem List   Diagnosis Date Noted  . Status post total left knee replacement 09/15/2015  . Statin intolerance 09/11/2015  . Peripheral neuropathy ([place]) 07/30/2015  . Abnormal chest x-ray 11/27/2014  . Protein-calorie malnutrition, severe ([place]) 11/27/2014  . AKI (acute kidney injury) ([place]) 11/26/2014  . Hypercalcemia 11/26/2014  . Leukocytosis 11/26/2014  . Weight loss 11/26/2014  . Weakness 11/26/2014  . Insomnia 04/12/2013  . Hyperlipidemia 04/12/2013  . Essential hypertension, benign 10/13/2012  . GERD (gastroesophageal reflux disease) 04/25/2012  . Fatty liver 08/09/2011  . Hepatitis B 08/09/2011  . Dysphagia 08/09/2011  . Type 2 diabetes mellitus ([place]) 08/09/2011  . Hypothyroid 08/09/2011    Wynelle Fanny, PTA 12/01/2015, 12:09 PM  [facility] [place], [place], [address] Phone: [phone]   Fax:   [phone]  Name: Lisa Sutton MRN: [id number] Date of Birth: [birth year]

## 2015-12-03 ENCOUNTER — Encounter: Payer: Self-pay | Admitting: Physical Therapy

## 2015-12-03 ENCOUNTER — Ambulatory Visit: Payer: Medicare Other | Admitting: Physical Therapy

## 2015-12-03 DIAGNOSIS — M25662 Stiffness of left knee, not elsewhere classified: Secondary | ICD-10-CM

## 2015-12-03 DIAGNOSIS — M25562 Pain in left knee: Secondary | ICD-10-CM

## 2015-12-03 NOTE — Therapy (Signed)
[facility] [place], [place], [address] Phone: [phone]   Fax:  [phone]  Physical Therapy Treatment  Patient Details  Name: Lisa Sutton MRN: [id number] Date of Birth: [birth year] Referring Provider: Rodell Perna MD.  Encounter Date: 12/03/2015      PT End of Session - 12/03/15 1030    Visit Number 7   Number of Visits 16   Date for PT Re-Evaluation 01/09/16   PT Start Time 1031   PT Stop Time 1118   PT Time Calculation (min) 47 min   Activity Tolerance Patient tolerated treatment well   Behavior During Therapy [facility] for tasks assessed/performed      Past Medical History  Diagnosis Date  . Type 2 diabetes mellitus ([place])   . Essential hypertension, benign   . GERD (gastroesophageal reflux disease)   . Osteoporosis   . Obesity   . Hypothyroidism   . Dysphagia     'sometimes but not a major issue' been checked out by GI (per pt)  . Fatty liver   . Kidney stone August 2014    Patient was seen at [facility]  . PONV (postoperative nausea and vomiting)   . Dysrhythmia     'heart used to skip but doesn't anymore' was checked out by Dr. Ron Parker late '90s, everything checked out ok and not had any skipping since (all per pt)  . Hepatitis B     had at age 77, 'GI doc said it's gone away'  . History of hiatal hernia   . Arthritis   . Cataract   . Hypercalcemia   . Anxiety   . Hepatitis B surface antigen positive     Past Surgical History  Procedure Laterality Date  . Abdominal hysterectomy    . Deviated septum repair    . Knee arthroscopy      left knee 2003  . Cataract extraction, bilateral    . Bilateral carpal tunnel release      Dr. Marlou Sa at surgical center  . Eye surgery    . Knee arthroplasty Left 09/15/2015    Procedure: COMPUTER ASSISTED TOTAL KNEE ARTHROPLASTY;  Surgeon: Marybelle Killings, MD;  Location: [place];  Service: Orthopedics;  Laterality: Left;    There were no vitals filed for this visit.       Subjective Assessment - 12/03/15 1030    Subjective Reports that her knee is "about the same."   Limitations Walking   How long can you walk comfortably? Community distances.   Currently in Pain? Yes   Pain Score 5    Pain Location Knee   Pain Orientation Left   Pain Descriptors / Indicators Sore   Pain Type Surgical pain   Pain Onset More than a month ago            [facility] PT Assessment - 12/03/15 0001    Assessment   Medical Diagnosis Left total kne replacement.   Onset Date/Surgical Date 09/15/15   Next MD Visit 12/10/2015   Restrictions   Weight Bearing Restrictions No                     [facility] Adult PT Treatment/Exercise - 12/03/15 0001    Knee/Hip Exercises: Stretches   Active Hamstring Stretch Left;3 reps;20 seconds   Knee/Hip Exercises: Aerobic   Nustep L5 x12 min   Knee/Hip Exercises: Standing   Terminal Knee Extension Limitations LLE TKE red theraband x20 reps   Lateral Step Up  Left;2 sets;10 reps;Hand Hold: 2;Step Height: 6"   Forward Step Up Left;3 sets;10 reps;Hand Hold: 2;Step Height: 6"   Rocker Board 3 minutes   Knee/Hip Exercises: Seated   Long Arc Quad Strengthening;Left;3 sets;10 reps   Long Arc Quad Weight 4 lbs.   Modalities   Modalities Designer, multimedia Location L knee   Electrical Stimulation Action Pre-Mod   Electrical Stimulation Parameters 80-150 hz x15 min   Electrical Stimulation Goals Pain;Edema   Vasopneumatic   Number Minutes Vasopneumatic  15 minutes   Vasopnuematic Location  Knee   Vasopneumatic Pressure Medium   Vasopneumatic Temperature  34                  PT Short Term Goals - 11/12/15 [id number]    PT SHORT TERM GOAL #1   Title Ind with a HEP.   Time 2   Period Weeks   Status Achieved           PT Long Term Goals - 12/01/15 1151    PT LONG TERM GOAL #1   Title Full left active knee extension in order to normalize gait.    Time 8   Period Weeks   Status On-going  AROM L knee ext -8 deg from neutral 12/01/2015   PT LONG TERM GOAL #2   Title Active knee flexion to 120-125 degrees+ so the patient can perform functional tasks and do so with pain not > 2-3/10.   Time 8   Period Weeks   Status Achieved  AROM L knee flex 125 degrees 12/01/2015   PT LONG TERM GOAL #3   Title Increase left knee strength to a solid 5/5 to provide good stability for accomplishment of functional activities   Time 8   Period Weeks   Status On-going   PT LONG TERM GOAL #4   Title Perform a reciprocating stair gait with one railing with pain not > 2-3/10.   Time 8   Period Weeks   Status On-going   PT LONG TERM GOAL #5   Title Decrease edema to within 3 cms of non-affected side to assist with pain reduction and range of motion gains.   Time 8   Period Weeks   Status Achieved  L knee edema 39 cm, R knee 36 cm as of 12/01/2015               Plan - 12/03/15 1105    Clinical Impression Statement Patient tolerated today's treatment well with no reports of increased pain in L knee with any of the exercises. Patient experienced feeling the HS stretch in LLE per patient report. Patient required minimal to moderate mutlimodal cueing for exercise technique especially with lateral step up and TKE with red theraband. Edema noted around the superior aspect of L patella region on bilateral sides. Normal modalities response noted following removal of the modalities.   Rehab Potential Excellent   PT Frequency 2x / week   PT Duration 8 weeks   PT Treatment/Interventions ADLs/Self Care Home Management;Cryotherapy;Electrical Stimulation;Therapeutic exercise;Therapeutic activities;Gait training;Patient/family education;Manual techniques;Passive range of motion   PT Next Visit Plan Continue with L knee strengthening with emphasis on improving L knee extension with modalities PRN for pain/edema per MPT POC   Consulted and Agree with Plan of Care  Patient      Patient will benefit from skilled therapeutic intervention in order to improve the following deficits and impairments:  Decreased activity tolerance, Pain, Decreased  range of motion, Decreased strength, Abnormal gait  Visit Diagnosis: Pain in left knee  Stiffness of left knee, not elsewhere classified     Problem List Patient Active Problem List   Diagnosis Date Noted  . Status post total left knee replacement 09/15/2015  . Statin intolerance 09/11/2015  . Peripheral neuropathy ([place]) 07/30/2015  . Abnormal chest x-ray 11/27/2014  . Protein-calorie malnutrition, severe ([place]) 11/27/2014  . AKI (acute kidney injury) ([place]) 11/26/2014  . Hypercalcemia 11/26/2014  . Leukocytosis 11/26/2014  . Weight loss 11/26/2014  . Weakness 11/26/2014  . Insomnia 04/12/2013  . Hyperlipidemia 04/12/2013  . Essential hypertension, benign 10/13/2012  . GERD (gastroesophageal reflux disease) 04/25/2012  . Fatty liver 08/09/2011  . Hepatitis B 08/09/2011  . Dysphagia 08/09/2011  . Type 2 diabetes mellitus ([place]) 08/09/2011  . Hypothyroid 08/09/2011    Wynelle Fanny, PTA 12/03/2015, 11:50 AM  [facility] [address] [place], [place], [address] Phone: [phone]   Fax:  [phone]  Name: Lisa Sutton MRN: [id number] Date of Birth: 06/14/1933

## 2015-12-08 ENCOUNTER — Ambulatory Visit: Payer: Medicare Other | Admitting: Physical Therapy

## 2015-12-08 ENCOUNTER — Encounter: Payer: Self-pay | Admitting: Physical Therapy

## 2015-12-08 DIAGNOSIS — M25662 Stiffness of left knee, not elsewhere classified: Secondary | ICD-10-CM

## 2015-12-08 DIAGNOSIS — M25562 Pain in left knee: Secondary | ICD-10-CM

## 2015-12-08 NOTE — Therapy (Addendum)
[facility] [place], [place], [address] Phone: [phone]   Fax:  [phone]  Physical Therapy Treatment  Patient Details  Name: Lisa Sutton MRN: [id number] Date of Birth: [birth year] Referring Provider: Rodell Perna MD.  Encounter Date: 12/08/2015      PT End of Session - 12/08/15 0943    Visit Number 8   Number of Visits 16   Date for PT Re-Evaluation 01/09/16   PT Start Time 0900   PT Stop Time 0956   PT Time Calculation (min) 56 min   Activity Tolerance Patient tolerated treatment well   Behavior During Therapy [facility] for tasks assessed/performed      Past Medical History  Diagnosis Date  . Type 2 diabetes mellitus ([place])   . Essential hypertension, benign   . GERD (gastroesophageal reflux disease)   . Osteoporosis   . Obesity   . Hypothyroidism   . Dysphagia     'sometimes but not a major issue' been checked out by GI (per pt)  . Fatty liver   . Kidney stone August 2014    Patient was seen at [facility]  . PONV (postoperative nausea and vomiting)   . Dysrhythmia     'heart used to skip but doesn't anymore' was checked out by Dr. Ron Parker late '90s, everything checked out ok and not had any skipping since (all per pt)  . Hepatitis B     had at age 64, 'GI doc said it's gone away'  . History of hiatal hernia   . Arthritis   . Cataract   . Hypercalcemia   . Anxiety   . Hepatitis B surface antigen positive     Past Surgical History  Procedure Laterality Date  . Abdominal hysterectomy    . Deviated septum repair    . Knee arthroscopy      left knee 2003  . Cataract extraction, bilateral    . Bilateral carpal tunnel release      Dr. Marlou Sa at surgical center  . Eye surgery    . Knee arthroplasty Left 09/15/2015    Procedure: COMPUTER ASSISTED TOTAL KNEE ARTHROPLASTY;  Surgeon: Marybelle Killings, MD;  Location: [place];  Service: Orthopedics;  Laterality: Left;    There were no vitals filed for this visit.       Subjective Assessment - 12/08/15 0919    Subjective Patient reported continued soreness in knee yet improving overall   Limitations Walking   How long can you walk comfortably? Community distances.   Currently in Pain? Yes   Pain Score 5    Pain Location Knee   Pain Orientation Left   Pain Descriptors / Indicators Sore   Pain Type Surgical pain   Pain Onset More than a month ago   Pain Frequency Constant   Aggravating Factors  prolong activity   Pain Relieving Factors at rest            [facility] PT Assessment - 12/08/15 0001    AROM   AROM Assessment Site Knee   Right/Left Knee Left   Left Knee Extension -8   PROM   PROM Assessment Site Knee   Right/Left Knee Left   Left Knee Extension -5                     [facility] Adult PT Treatment/Exercise - 12/08/15 0001    Knee/Hip Exercises: Aerobic   Nustep L5 x15 min for ROM/Strength   Knee/Hip Exercises: Standing  Lateral Step Up Left;2 sets;10 reps;Hand Hold: 2;Step Height: 6"   Forward Step Up Left;3 sets;10 reps;Hand Hold: 2;Step Height: 6"   Rocker Board 3 minutes   Knee/Hip Exercises: Seated   Long Arc Quad Strengthening;Left;3 sets;10 reps   Long Arc Quad Weight 4 lbs.   Acupuncturist Location L knee   Water quality scientist Parameters 1-_0    Electrical Stimulation Goals Pain;Edema   Vasopneumatic   Number Minutes Vasopneumatic  15 minutes   Vasopnuematic Location  Knee   Vasopneumatic Pressure Medium   Manual Therapy   Manual Therapy Passive ROM   Passive ROM PROM of L knee into ext with overpressure and holds at end range                  PT Short Term Goals - 11/12/15 0936    PT SHORT TERM GOAL #1   Title Ind with a HEP.   Time 2   Period Weeks   Status Achieved           PT Long Term Goals - 12/08/15 3845    PT LONG TERM GOAL #1   Title Full left active knee extension in order to normalize gait.    Time 8   Period Weeks   Status On-going  AROM -8 degrees 12/08/15   PT LONG TERM GOAL #2   Title Active knee flexion to 120-125 degrees+ so the patient can perform functional tasks and do so with pain not > 2-3/10.   Time 8   Period Weeks   Status Achieved   PT LONG TERM GOAL #3   Title Increase left knee strength to a solid 5/5 to provide good stability for accomplishment of functional activities   Time 8   Period Weeks   Status On-going   PT LONG TERM GOAL #4   Title Perform a reciprocating stair gait with one railing with pain not > 2-3/10.   Time 8   Period Weeks   Status On-going  5/10 soreness 12/08/15   PT LONG TERM GOAL #5   Title Decrease edema to within 3 cms of non-affected side to assist with pain reduction and range of motion gains.   Period Weeks   Status Achieved               Plan - 12/08/15 0945    Clinical Impression Statement Patient progressing with all activities today. Patient is improving with active and passive ROM for left knee ext. Patient progressing with strengthening and has no pain or difficulty. Patient has continued complaints of some soreness up to 5/10 and some slight edema. Patient goals ongoing due to pain and strength deficits.   Rehab Potential Excellent   PT Frequency 2x / week   PT Duration 8 weeks   PT Treatment/Interventions ADLs/Self Care Home Management;Cryotherapy;Electrical Stimulation;Therapeutic exercise;Therapeutic activities;Gait training;Patient/family education;Manual techniques;Passive range of motion   PT Next Visit Plan Continue with L knee strengthening and progress with weight machines also improving L knee extension with modalities PRN for pain/edema per MPT POC (MD Rodell Perna 12/10/15)   Consulted and Agree with Plan of Care Patient      Patient will benefit from skilled therapeutic intervention in order to improve the following deficits and impairments:  Decreased activity tolerance, Pain, Decreased range of  motion, Decreased strength, Abnormal gait  Visit Diagnosis: Pain in left knee  Stiffness of left knee, not elsewhere classified     Problem  List Patient Active Problem List   Diagnosis Date Noted  . Status post total left knee replacement 09/15/2015  . Statin intolerance 09/11/2015  . Peripheral neuropathy ([place]) 07/30/2015  . Abnormal chest x-ray 11/27/2014  . Protein-calorie malnutrition, severe ([place]) 11/27/2014  . AKI (acute kidney injury) ([place]) 11/26/2014  . Hypercalcemia 11/26/2014  . Leukocytosis 11/26/2014  . Weight loss 11/26/2014  . Weakness 11/26/2014  . Insomnia 04/12/2013  . Hyperlipidemia 04/12/2013  . Essential hypertension, benign 10/13/2012  . GERD (gastroesophageal reflux disease) 04/25/2012  . Fatty liver 08/09/2011  . Hepatitis B 08/09/2011  . Dysphagia 08/09/2011  . Type 2 diabetes mellitus ([place]) 08/09/2011  . Hypothyroid 08/09/2011    Kynleigh Artz, [place] MPT 12/08/2015, 10:05 AM  [facility] [place], [place], [address] Phone: [phone]   Fax:  [phone]  Name: Lisa Sutton MRN: [id number] Date of Birth: 08/20/1933  PHYSICAL THERAPY DISCHARGE SUMMARY  Visits from Start of Care: 8.  Current functional level related to goals / functional outcomes: See above.   Remaining deficits: Continued loss of left knee ROM and pain.   Education / Equipment: HEP.  Plan: Patient agrees to discharge.  Patient goals were partially met. Patient is being discharged due to meeting the stated rehab goals.  ?????         [place] Maeva Dant MPT

## 2015-12-08 NOTE — Therapy (Signed)
[facility] [place], [place], [address] Phone: [phone]   Fax:  [phone]  Physical Therapy Treatment  Patient Details  Name: Lisa Sutton MRN: [id number] Date of Birth: [birth year] Referring Provider: Rodell Perna MD.  Encounter Date: 12/08/2015      PT End of Session - 12/08/15 0943    Visit Number 8   Number of Visits 16   Date for PT Re-Evaluation 01/09/16   PT Start Time 0900   PT Stop Time 0956   PT Time Calculation (min) 56 min   Activity Tolerance Patient tolerated treatment well   Behavior During Therapy [facility] for tasks assessed/performed      Past Medical History  Diagnosis Date  . Type 2 diabetes mellitus ([place])   . Essential hypertension, benign   . GERD (gastroesophageal reflux disease)   . Osteoporosis   . Obesity   . Hypothyroidism   . Dysphagia     'sometimes but not a major issue' been checked out by GI (per pt)  . Fatty liver   . Kidney stone August 2014    Patient was seen at [facility]  . PONV (postoperative nausea and vomiting)   . Dysrhythmia     'heart used to skip but doesn't anymore' was checked out by Dr. Ron Parker late '90s, everything checked out ok and not had any skipping since (all per pt)  . Hepatitis B     had at age 64, 'GI doc said it's gone away'  . History of hiatal hernia   . Arthritis   . Cataract   . Hypercalcemia   . Anxiety   . Hepatitis B surface antigen positive     Past Surgical History  Procedure Laterality Date  . Abdominal hysterectomy    . Deviated septum repair    . Knee arthroscopy      left knee 2003  . Cataract extraction, bilateral    . Bilateral carpal tunnel release      Dr. Marlou Sa at surgical center  . Eye surgery    . Knee arthroplasty Left 09/15/2015    Procedure: COMPUTER ASSISTED TOTAL KNEE ARTHROPLASTY;  Surgeon: Marybelle Killings, MD;  Location: [place];  Service: Orthopedics;  Laterality: Left;    There were no vitals filed for this visit.       Subjective Assessment - 12/08/15 0919    Subjective Patient reported continued soreness in knee yet improving overall   Limitations Walking   How long can you walk comfortably? Community distances.   Currently in Pain? Yes   Pain Score 5    Pain Location Knee   Pain Orientation Left   Pain Descriptors / Indicators Sore   Pain Type Surgical pain   Pain Onset More than a month ago   Pain Frequency Constant   Aggravating Factors  prolong activity   Pain Relieving Factors at rest            [facility] PT Assessment - 12/08/15 0001    AROM   AROM Assessment Site Knee   Right/Left Knee Left   Left Knee Extension -8   PROM   PROM Assessment Site Knee   Right/Left Knee Left   Left Knee Extension -5                     [facility] Adult PT Treatment/Exercise - 12/08/15 0001    Knee/Hip Exercises: Aerobic   Nustep L5 x15 min for ROM/Strength   Knee/Hip Exercises: Standing  Lateral Step Up Left;2 sets;10 reps;Hand Hold: 2;Step Height: 6"   Forward Step Up Left;3 sets;10 reps;Hand Hold: 2;Step Height: 6"   Rocker Board 3 minutes   Knee/Hip Exercises: Seated   Long Arc Quad Strengthening;Left;3 sets;10 reps   Long Arc Quad Weight 4 lbs.   Acupuncturist Location L knee   Water quality scientist Parameters 1-_0    Electrical Stimulation Goals Pain;Edema   Vasopneumatic   Number Minutes Vasopneumatic  15 minutes   Vasopnuematic Location  Knee   Vasopneumatic Pressure Medium   Manual Therapy   Manual Therapy Passive ROM   Passive ROM PROM of L knee into ext with overpressure and holds at end range                  PT Short Term Goals - 11/12/15 0936    PT SHORT TERM GOAL #1   Title Ind with a HEP.   Time 2   Period Weeks   Status Achieved           PT Long Term Goals - 12/08/15 3845    PT LONG TERM GOAL #1   Title Full left active knee extension in order to normalize gait.    Time 8   Period Weeks   Status On-going  AROM -8 degrees 12/08/15   PT LONG TERM GOAL #2   Title Active knee flexion to 120-125 degrees+ so the patient can perform functional tasks and do so with pain not > 2-3/10.   Time 8   Period Weeks   Status Achieved   PT LONG TERM GOAL #3   Title Increase left knee strength to a solid 5/5 to provide good stability for accomplishment of functional activities   Time 8   Period Weeks   Status On-going   PT LONG TERM GOAL #4   Title Perform a reciprocating stair gait with one railing with pain not > 2-3/10.   Time 8   Period Weeks   Status On-going  5/10 soreness 12/08/15   PT LONG TERM GOAL #5   Title Decrease edema to within 3 cms of non-affected side to assist with pain reduction and range of motion gains.   Period Weeks   Status Achieved               Plan - 12/08/15 0945    Clinical Impression Statement Patient progressing with all activities today. Patient is improving with active and passive ROM for left knee ext. Patient progressing with strengthening and has no pain or difficulty. Patient has continued complaints of some soreness up to 5/10 and some slight edema. Patient goals ongoing due to pain and strength deficits.   Rehab Potential Excellent   PT Frequency 2x / week   PT Duration 8 weeks   PT Treatment/Interventions ADLs/Self Care Home Management;Cryotherapy;Electrical Stimulation;Therapeutic exercise;Therapeutic activities;Gait training;Patient/family education;Manual techniques;Passive range of motion   PT Next Visit Plan Continue with L knee strengthening and progress with weight machines also improving L knee extension with modalities PRN for pain/edema per MPT POC (MD Rodell Perna 12/10/15)   Consulted and Agree with Plan of Care Patient      Patient will benefit from skilled therapeutic intervention in order to improve the following deficits and impairments:  Decreased activity tolerance, Pain, Decreased range of  motion, Decreased strength, Abnormal gait  Visit Diagnosis: Pain in left knee  Stiffness of left knee, not elsewhere classified     Problem  List Patient Active Problem List   Diagnosis Date Noted  . Status post total left knee replacement 09/15/2015  . Statin intolerance 09/11/2015  . Peripheral neuropathy ([place]) 07/30/2015  . Abnormal chest x-ray 11/27/2014  . Protein-calorie malnutrition, severe ([place]) 11/27/2014  . AKI (acute kidney injury) ([place]) 11/26/2014  . Hypercalcemia 11/26/2014  . Leukocytosis 11/26/2014  . Weight loss 11/26/2014  . Weakness 11/26/2014  . Insomnia 04/12/2013  . Hyperlipidemia 04/12/2013  . Essential hypertension, benign 10/13/2012  . GERD (gastroesophageal reflux disease) 04/25/2012  . Fatty liver 08/09/2011  . Hepatitis B 08/09/2011  . Dysphagia 08/09/2011  . Type 2 diabetes mellitus ([place]) 08/09/2011  . Hypothyroid 08/09/2011    Oisin Yoakum P, PTA 12/08/2015, 9:58 AM   Ladean Raya, PTA 12/08/2015 9:59 AM   [facility] [place], [place], [address] Phone: [phone]   Fax:  [phone]  Name: LASHAWNDRA JANKIEWICZ MRN: [id number] Date of Birth: Oct 08, 1933

## 2015-12-09 DIAGNOSIS — Z79899 Other long term (current) drug therapy: Secondary | ICD-10-CM | POA: Diagnosis not present

## 2015-12-09 DIAGNOSIS — N183 Chronic kidney disease, stage 3 (moderate): Secondary | ICD-10-CM | POA: Diagnosis not present

## 2015-12-09 DIAGNOSIS — E559 Vitamin D deficiency, unspecified: Secondary | ICD-10-CM | POA: Diagnosis not present

## 2015-12-09 DIAGNOSIS — R809 Proteinuria, unspecified: Secondary | ICD-10-CM | POA: Diagnosis not present

## 2015-12-09 DIAGNOSIS — D509 Iron deficiency anemia, unspecified: Secondary | ICD-10-CM | POA: Diagnosis not present

## 2015-12-16 ENCOUNTER — Ambulatory Visit: Payer: Medicare Other | Admitting: Family Medicine

## 2015-12-16 DIAGNOSIS — R809 Proteinuria, unspecified: Secondary | ICD-10-CM | POA: Diagnosis not present

## 2015-12-16 DIAGNOSIS — I10 Essential (primary) hypertension: Secondary | ICD-10-CM | POA: Diagnosis not present

## 2015-12-16 DIAGNOSIS — N183 Chronic kidney disease, stage 3 (moderate): Secondary | ICD-10-CM | POA: Diagnosis not present

## 2015-12-17 ENCOUNTER — Encounter: Payer: Self-pay | Admitting: Family Medicine

## 2015-12-17 ENCOUNTER — Ambulatory Visit (INDEPENDENT_AMBULATORY_CARE_PROVIDER_SITE_OTHER): Payer: Medicare Other | Admitting: Family Medicine

## 2015-12-17 VITALS — BP 116/77 | HR 62 | Temp 97.9°F | Ht 63.0 in | Wt 156.0 lb

## 2015-12-17 DIAGNOSIS — K219 Gastro-esophageal reflux disease without esophagitis: Secondary | ICD-10-CM

## 2015-12-17 DIAGNOSIS — E119 Type 2 diabetes mellitus without complications: Secondary | ICD-10-CM | POA: Diagnosis not present

## 2015-12-17 DIAGNOSIS — E785 Hyperlipidemia, unspecified: Secondary | ICD-10-CM | POA: Diagnosis not present

## 2015-12-17 DIAGNOSIS — I10 Essential (primary) hypertension: Secondary | ICD-10-CM | POA: Diagnosis not present

## 2015-12-17 DIAGNOSIS — E559 Vitamin D deficiency, unspecified: Secondary | ICD-10-CM

## 2015-12-17 DIAGNOSIS — E039 Hypothyroidism, unspecified: Secondary | ICD-10-CM

## 2015-12-17 MED ORDER — TRAMADOL HCL 50 MG PO TABS: ORAL_TABLET | ORAL | 0 refills | Status: DC

## 2015-12-17 NOTE — Progress Notes (Signed)
Subjective:    Patient ID: Lisa Sutton, female    DOB: 12-27-1933, 82 y.o.   MRN: [id number]  HPI Pt here for follow up and management of chronic medical problems which includes diabetes, hypothyroid, and hyperlipidemia. She is taking medications regularly.The patient is complaining today of some sinus problems and issues. She had a left total knee replacement on April 24 of this year. She is requesting something for pain at bedtime if possible. The patient denies any chest pain shortness of breath trouble swallowing blood in the stool or black tarry bowel movements. She does have occasional heartburn. She continues to take the Zegerid that she's been on the past but only as needed. She denies any trouble passing her water. She says that the orthopedic surgeon has given her some exercises to [place] trait in the leg out some more especially when she is laying on the bed at night. She has pain in the knee at nighttime and we told her that we would give her a few pills of Ultram to take only if needed with no refills. She will let her orthopedic surgeon know this. Blood sugars at home fasting have been running in the low 100 range. Recent lab work done by the nephrologist because of her history of hypercalcemia was reviewed with her during the visit today. Her calcium level was good.      Patient Active Problem List   Diagnosis Date Noted  . Status post total left knee replacement 09/15/2015  . Statin intolerance 09/11/2015  . Peripheral neuropathy ([place]) 07/30/2015  . Abnormal chest x-ray 11/27/2014  . Protein-calorie malnutrition, severe ([place]) 11/27/2014  . AKI (acute kidney injury) ([place]) 11/26/2014  . Hypercalcemia 11/26/2014  . Leukocytosis 11/26/2014  . Weight loss 11/26/2014  . Weakness 11/26/2014  . Insomnia 04/12/2013  . Hyperlipidemia 04/12/2013  . Essential hypertension, benign 10/13/2012  . GERD (gastroesophageal reflux disease) 04/25/2012  . Fatty liver 08/09/2011  . Hepatitis  B 08/09/2011  . Dysphagia 08/09/2011  . Type 2 diabetes mellitus ([place]) 08/09/2011  . Hypothyroid 08/09/2011   Outpatient Encounter Prescriptions as of 12/17/2015  Medication Sig  . ACCU-CHEK AVIVA PLUS test strip Test blood sugar two times  daily  . ALPRAZolam (XANAX) 0.25 MG tablet Take 1 tablet (0.25 mg total) by mouth 2 (two) times daily as needed for anxiety. (Patient taking differently: Take 0.25 mg by mouth at bedtime as needed for anxiety. )  . amLODipine (NORVASC) 2.5 MG tablet Take 1 tablet by mouth  daily  . aspirin EC 325 MG EC tablet Take 1 tablet (325 mg total) by mouth daily with breakfast.  . atenolol (TENORMIN) 50 MG tablet Take 1 tablet by mouth  daily  . furosemide (LASIX) 20 MG tablet Take 2 tablets (40 mg total) by mouth daily. (Patient taking differently: Take 20 mg by mouth daily. )  . levothyroxine (LEVOTHROID) 25 MCG tablet Take 1 tablet (25 mcg total) by mouth daily before breakfast.  . levothyroxine (SYNTHROID, LEVOTHROID) 112 MCG tablet Take 1 tablet (112 mcg total) by mouth daily.  Marland Kitchen lisinopril (PRINIVIL,ZESTRIL) 20 MG tablet Take 1 tablet (20 mg total) by mouth daily.  . metFORMIN (GLUCOPHAGE) 500 MG tablet Take 1 tablet (500 mg total) by mouth 2 (two) times daily with a meal.  . methocarbamol (ROBAXIN) 500 MG tablet Take 1 tablet (500 mg total) by mouth every 6 (six) hours as needed for muscle spasms.  Earney Navy Bicarbonate (ZEGERID) 20-1100 MG CAPS capsule Take 1 capsule by  mouth daily as needed (for heartburn or acid reflux).   . ondansetron (ZOFRAN) 4 MG tablet Take 1 tablet (4 mg total) by mouth every 8 (eight) hours as needed for nausea or vomiting.  . [DISCONTINUED] oxyCODONE-acetaminophen (PERCOCET/ROXICET) 5-325 MG tablet Take 1 tablet by mouth every 6 (six) hours as needed for severe pain.   No facility-administered encounter medications on file as of 12/17/2015.          Review of Systems  Constitutional: Negative.   HENT: Positive for  sinus pressure.   Eyes: Negative.   Respiratory: Negative.   Cardiovascular: Negative.   Gastrointestinal: Negative.   Endocrine: Negative.   Genitourinary: Negative.   Musculoskeletal: Positive for arthralgias (left knee - recent total knee surgery).  Skin: Negative.   Allergic/Immunologic: Negative.   Neurological: Negative.   Hematological: Negative.   Psychiatric/Behavioral: Negative.        Objective:   Physical Exam  Constitutional: She is oriented to person, place, and time. She appears well-developed and well-nourished. No distress.  HENT:  Head: Normocephalic and atraumatic.  Right Ear: External ear normal.  Left Ear: External ear normal.  Nose: Nose normal.  Mouth/Throat: Oropharynx is clear and moist.  Eyes: Conjunctivae and EOM are normal. Pupils are equal, round, and reactive to light. Right eye exhibits no discharge. Left eye exhibits no discharge. No scleral icterus.  Neck: Normal range of motion. Neck supple. No thyromegaly present.  Cardiovascular: Normal rate, regular rhythm, normal heart sounds and intact distal pulses.   No murmur heard. The heart has a regular rate and rhythm at 60/m  Pulmonary/Chest: Effort normal and breath sounds normal. No respiratory distress. She has no wheezes. She has no rales. She exhibits no tenderness.  Clear anteriorly and posteriorly  Abdominal: Soft. Bowel sounds are normal. She exhibits no mass. There is no tenderness. There is no rebound and no guarding.  No abdominal tenderness liver or spleen enlargement  Musculoskeletal: She exhibits no edema or tenderness.  The patient is still having some problems straightening out the left knee completely but this is getting better and she will continue with her exercise regimen given to her by the orthopedic surgeon.  Lymphadenopathy:    She has no cervical adenopathy.  Neurological: She is alert and oriented to person, place, and time. She has normal reflexes. No cranial nerve  deficit.  Skin: Skin is warm and dry. No rash noted.  Psychiatric: She has a normal mood and affect. Her behavior is normal. Judgment and thought content normal.  Nursing note and vitals reviewed.   BP 116/77 (BP Location: Left Arm)   Pulse 62   Temp 97.9 F (36.6 C) (Oral)   Ht 5\' 3"  (1.6 m)   Wt 156 lb (70.8 kg)   BMI 27.63 kg/m        Assessment & Plan:  1. Type 2 diabetes mellitus without complication, without long-term current use of insulin (HCC) -Continue watching your diet closely and checking blood sugars regularly -The hemoglobin A1c was good at 5.8%.  2. Hypothyroidism, unspecified hypothyroidism type -Continue current treatment.  3. Hyperlipidemia -The patient is statin intolerant and she will continue with exercise as determined by the orthopedic surgeon and watching her diet more closely  4. Essential hypertension, benign -The blood pressure is good today and she will continue with current treatment  5. Vitamin D deficiency -The vitamin D level is good.  6. Gastroesophageal reflux disease without esophagitis -She will take her Zegerid a little bit more regularly  for a couple weeks and then try to taper off of it again.  7. Hypercalcemia -This now appears to be under control with a normal serum calcium level and she does have a follow-up appointment with the nephrologist in about 6 months.  Meds ordered this encounter  Medications  . traMADol (ULTRAM) 50 MG tablet    Sig: 1/2 to whole tab QHS PRN    Dispense:  20 tablet    Refill:  0   Patient Instructions                       Medicare Annual Wellness Visit  [facility] and the medical providers at [facility] strive to bring you the best medical care.  In doing so we not only want to address your current medical conditions and concerns but also to detect new conditions early and prevent illness, disease and health-related problems.    Medicare offers a yearly Wellness Visit  which allows our clinical staff to assess your need for preventative services including immunizations, lifestyle education, counseling to decrease risk of preventable diseases and screening for fall risk and other medical concerns.    This visit is provided free of charge (no copay) for all Medicare recipients. The clinical pharmacists at [facility] have begun to conduct these Wellness Visits which will also include a thorough review of all your medications.    As you primary medical provider recommend that you make an appointment for your Annual Wellness Visit if you have not done so already this year.  You may set up this appointment before you leave today or you may call back [id number] and schedule an appointment.  Please make sure when you call that you mention that you are scheduling your Annual Wellness Visit with the clinical pharmacist so that the appointment may be made for the proper length of time.     Continue current medications. Continue good therapeutic lifestyle changes which include good diet and exercise. Fall precautions discussed with patient. If an FOBT was given today- please return it to our front desk. If you are over 4 years old - you may need Prevnar 5 or the adult Pneumonia vaccine.   After your visit with [place] today you will receive a survey in the mail or online from [employer] regarding your care with [place]. Please take a moment to fill this out. Your feedback is very important to [place] as you can help [place] better understand your patient needs as well as improve your experience and satisfaction. WE CARE ABOUT YOU!!!   Continue to follow-up with orthopedist as recommended Continue to do exercises as he is recommended Try to watch her diet more closely and as you increase your exercise activities hopefully this will help bring the cholesterol numbers down since you are statin intolerant Continue to check blood sugars.    Arrie Senate MD

## 2015-12-17 NOTE — Patient Instructions (Addendum)
Medicare Annual Wellness Visit  [facility] and the medical providers at [facility] strive to bring you the best medical care.  In doing so we not only want to address your current medical conditions and concerns but also to detect new conditions early and prevent illness, disease and health-related problems.    Medicare offers a yearly Wellness Visit which allows our clinical staff to assess your need for preventative services including immunizations, lifestyle education, counseling to decrease risk of preventable diseases and screening for fall risk and other medical concerns.    This visit is provided free of charge (no copay) for all Medicare recipients. The clinical pharmacists at [facility] have begun to conduct these Wellness Visits which will also include a thorough review of all your medications.    As you primary medical provider recommend that you make an appointment for your Annual Wellness Visit if you have not done so already this year.  You may set up this appointment before you leave today or you may call back [id number] and schedule an appointment.  Please make sure when you call that you mention that you are scheduling your Annual Wellness Visit with the clinical pharmacist so that the appointment may be made for the proper length of time.     Continue current medications. Continue good therapeutic lifestyle changes which include good diet and exercise. Fall precautions discussed with patient. If an FOBT was given today- please return it to our front desk. If you are over 72 years old - you may need Prevnar 73 or the adult Pneumonia vaccine.   After your visit with [place] today you will receive a survey in the mail or online from [employer] regarding your care with [place]. Please take a moment to fill this out. Your feedback is very important to [place] as you can help [place] better understand your patient needs as well as  improve your experience and satisfaction. WE CARE ABOUT YOU!!!   Continue to follow-up with orthopedist as recommended Continue to do exercises as he is recommended Try to watch her diet more closely and as you increase your exercise activities hopefully this will help bring the cholesterol numbers down since you are statin intolerant Continue to check blood sugars.

## 2015-12-19 ENCOUNTER — Other Ambulatory Visit: Payer: Self-pay

## 2015-12-19 ENCOUNTER — Ambulatory Visit: Payer: Medicare Other | Admitting: Family Medicine

## 2015-12-19 MED ORDER — AMLODIPINE BESYLATE 2.5 MG PO TABS: 2.5000 mg | ORAL_TABLET | Freq: Every day | ORAL | 1 refills | Status: DC

## 2016-01-07 ENCOUNTER — Other Ambulatory Visit: Payer: Self-pay | Admitting: Family Medicine

## 2016-01-13 DIAGNOSIS — M1712 Unilateral primary osteoarthritis, left knee: Secondary | ICD-10-CM | POA: Diagnosis not present

## 2016-02-05 ENCOUNTER — Other Ambulatory Visit: Payer: Self-pay | Admitting: Family Medicine

## 2016-02-05 DIAGNOSIS — R591 Generalized enlarged lymph nodes: Secondary | ICD-10-CM

## 2016-02-24 ENCOUNTER — Ambulatory Visit (INDEPENDENT_AMBULATORY_CARE_PROVIDER_SITE_OTHER): Payer: Medicare Other | Admitting: Internal Medicine

## 2016-02-24 ENCOUNTER — Encounter (INDEPENDENT_AMBULATORY_CARE_PROVIDER_SITE_OTHER): Payer: Self-pay | Admitting: Internal Medicine

## 2016-02-24 VITALS — BP 128/82 | HR 66 | Temp 98.2°F | Resp 18 | Ht 63.0 in | Wt 161.4 lb

## 2016-02-24 DIAGNOSIS — B181 Chronic viral hepatitis B without delta-agent: Secondary | ICD-10-CM

## 2016-02-24 DIAGNOSIS — K219 Gastro-esophageal reflux disease without esophagitis: Secondary | ICD-10-CM

## 2016-02-24 DIAGNOSIS — R1319 Other dysphagia: Secondary | ICD-10-CM

## 2016-02-24 DIAGNOSIS — R1310 Dysphagia, unspecified: Secondary | ICD-10-CM

## 2016-02-24 MED ORDER — OMEPRAZOLE-SODIUM BICARBONATE 20-1100 MG PO CAPS: 1.0000 | ORAL_CAPSULE | ORAL | Status: DC

## 2016-02-24 NOTE — Patient Instructions (Addendum)
Take Zegerid OTC Monday Wednesday and Friday and fourth dose on as needed basis. Call if he have swallowing difficulty

## 2016-02-24 NOTE — Progress Notes (Signed)
Presenting complaint;  Follow-up for GERD dysphagia and history of positive HBsAg.  Subjective:  Patient is 82 year old Caucasian female who is here for scheduled visit. She was last seen on 08/19/2015. On her last visit she was complaining of solid food dysphagia. She had barium study on 08/28/2015 revealing small sliding hiatal hernia and possibly noncritical Schatzki's ring. Barium pill was parenterally held at GE junction but passed distally with few more swallows. It was decided to monitor her dysphagia. Patient states she is not having any problems swallowing but she is having intermittent heartburn at times white severe and she also has nocturnal regurgitation. She is using Zegerid OTC on as-needed basis. She denies nausea vomiting melena or rectal bleeding. Her bowels move daily. She had a left knee replacement on 09/15/2015. She has daily pain which she describes mild and/or soreness. She has some difficulty sleeping. She states she has tried different pain medications but nothing helps. Presently she is taking Tylenol at bedtime. She has remote history of colonic polyps and wants to know if she should undergo colonoscopy. Family history is negative for CRC.    Current Medications: Outpatient Encounter Prescriptions as of 02/24/2016  Medication Sig  . ACCU-CHEK AVIVA PLUS test strip Test blood sugar two times  daily  . ALPRAZolam (XANAX) 0.25 MG tablet Take 1 tablet (0.25 mg total) by mouth 2 (two) times daily as needed for anxiety. (Patient taking differently: Take 0.25 mg by mouth at bedtime as needed for anxiety. )  . amLODipine (NORVASC) 2.5 MG tablet Take 1 tablet (2.5 mg total) by mouth daily.  Marland Kitchen aspirin EC 325 MG EC tablet Take 1 tablet (325 mg total) by mouth daily with breakfast.  . atenolol (TENORMIN) 50 MG tablet Take 1 tablet by mouth  daily  . furosemide (LASIX) 20 MG tablet Take 2 tablets (40 mg total) by mouth daily. (Patient taking differently: Take 20 mg by mouth daily.  )  . levothyroxine (LEVOTHROID) 25 MCG tablet Take 1 tablet (25 mcg total) by mouth daily before breakfast.  . levothyroxine (SYNTHROID, LEVOTHROID) 112 MCG tablet Take 1 tablet by mouth  daily  . lisinopril (PRINIVIL,ZESTRIL) 20 MG tablet Take 1 tablet by mouth  daily  . metFORMIN (GLUCOPHAGE) 500 MG tablet Take 1 tablet by mouth two  times daily with meals  . methocarbamol (ROBAXIN) 500 MG tablet Take 1 tablet (500 mg total) by mouth every 6 (six) hours as needed for muscle spasms.  Earney Navy Bicarbonate (ZEGERID) 20-1100 MG CAPS capsule Take 1 capsule by mouth daily as needed (for heartburn or acid reflux).   . traMADol (ULTRAM) 50 MG tablet 1/2 to whole tab QHS PRN  . [DISCONTINUED] ondansetron (ZOFRAN) 4 MG tablet Take 1 tablet (4 mg total) by mouth every 8 (eight) hours as needed for nausea or vomiting. (Patient not taking: Reported on 02/24/2016)   No facility-administered encounter medications on file as of 02/24/2016.      Objective: Blood pressure 128/82, pulse 66, temperature 98.2 F (36.8 C), temperature source Oral, resp. rate 18, height 5\' 3"  (1.6 m), weight 161 lb 6.4 oz (73.2 kg). Patient is alert and in no acute distress. Conjunctiva is pink. Sclera is nonicteric Oropharyngeal mucosa is normal. No neck masses or thyromegaly noted. Cardiac exam with regular rhythm normal S1 and S2. No murmur or gallop noted. Lungs are clear to auscultation. Abdomen is full but symmetrical soft and nontender without hepatosplenomegaly. Liver edge is indistinct below RCM.  No LE edema or clubbing noted.  Labs/studies Results: Lab data from 03/13/2015  Hepatitis B surface antigen was negative  Hepatitis B surface antibody was negative.   Lab data from 12/09/2015   WBC 7.5, H&H 13.6 and 41.2 and platelet count 242K.  Bilirubin 0.2, AP 87, AST 17, ALT 14, total protein 7.0 and albumin 4.3. Serum calcium 9.8. Glucose 111 BUN 27 and creatinine 1.20.  Assessment:  #1.  Esophageal dysphagia. Last barium study was in April 2017 revealing noncritical Schatzki's ring and a small sliding hiatal hernia. She is presently is asymptomatic and does not need esophageal dilation. #2. Chronic GERD. She is having intermittent symptoms and therefore needs to be on a scheduled dose of PPI rather than when necessary. #3. History of positive hepatitis B surface antigen with undetectable HBV DNA levels. Hepatitis B surface antigen has cleared spontaneously but she never developed hepatitis B surface antibody. She has no evidence of chronic liver disease. This condition will become relevant if she ever had has to be on immunosuppressive therapy in which case she will need medication to suppress HBV.   Plan:  Patient will take Zegerid OTC 1 tablet every other day or 3 times a week. Patient will call if she develops swallowing difficulty in which case we'll schedule EGD with ED. She will return for office visit in one year at which time will check hepatitis B surface antigen and hepatitis B surface antibody.Marland Kitchen

## 2016-04-19 ENCOUNTER — Encounter: Payer: Self-pay | Admitting: Family Medicine

## 2016-04-19 DIAGNOSIS — R531 Weakness: Secondary | ICD-10-CM

## 2016-04-19 DIAGNOSIS — I10 Essential (primary) hypertension: Secondary | ICD-10-CM

## 2016-04-19 DIAGNOSIS — E039 Hypothyroidism, unspecified: Secondary | ICD-10-CM

## 2016-04-19 DIAGNOSIS — E119 Type 2 diabetes mellitus without complications: Secondary | ICD-10-CM

## 2016-04-19 DIAGNOSIS — E785 Hyperlipidemia, unspecified: Secondary | ICD-10-CM

## 2016-04-21 ENCOUNTER — Other Ambulatory Visit: Payer: Medicare Other

## 2016-04-21 DIAGNOSIS — E119 Type 2 diabetes mellitus without complications: Secondary | ICD-10-CM | POA: Diagnosis not present

## 2016-04-21 DIAGNOSIS — R6889 Other general symptoms and signs: Secondary | ICD-10-CM | POA: Diagnosis not present

## 2016-04-21 DIAGNOSIS — Z7689 Persons encountering health services in other specified circumstances: Secondary | ICD-10-CM | POA: Diagnosis not present

## 2016-04-21 DIAGNOSIS — E785 Hyperlipidemia, unspecified: Secondary | ICD-10-CM | POA: Diagnosis not present

## 2016-04-21 DIAGNOSIS — I10 Essential (primary) hypertension: Secondary | ICD-10-CM

## 2016-04-21 DIAGNOSIS — E039 Hypothyroidism, unspecified: Secondary | ICD-10-CM | POA: Diagnosis not present

## 2016-04-21 LAB — BAYER DCA HB A1C WAIVED: HB A1C (BAYER DCA - WAIVED): 5.7 % (ref <7.0)

## 2016-04-22 LAB — NMR, LIPOPROFILE
Cholesterol: 283 mg/dL — ABNORMAL HIGH (ref 100–199)
HDL CHOLESTEROL BY NMR: 46 mg/dL (ref >39)
HDL Particle Number: 29.3 umol/L — ABNORMAL LOW (ref >=30.5)
LDL PARTICLE NUMBER: 2217 nmol/L — AB (ref <1000)
LDL Size: 20 nm (ref >20.5)
LDL-C: 163 mg/dL — AB (ref 0–99)
LP-IR SCORE: 83 — AB (ref <=45)
Small LDL Particle Number: 1129 nmol/L — ABNORMAL HIGH (ref <=527)
Triglycerides by NMR: 368 mg/dL — ABNORMAL HIGH (ref 0–149)

## 2016-04-22 LAB — BMP8+EGFR
BUN / CREAT RATIO: 17 (ref 12–28)
BUN: 22 mg/dL (ref 8–27)
CHLORIDE: 97 mmol/L (ref 96–106)
CO2: 25 mmol/L (ref 18–29)
Calcium: 9.7 mg/dL (ref 8.7–10.3)
Creatinine, Ser: 1.29 mg/dL — ABNORMAL HIGH (ref 0.57–1.00)
GFR, EST AFRICAN AMERICAN: 45 mL/min/{1.73_m2} — AB (ref >59)
GFR, EST NON AFRICAN AMERICAN: 39 mL/min/{1.73_m2} — AB (ref >59)
Glucose: 122 mg/dL — ABNORMAL HIGH (ref 65–99)
Potassium: 4.6 mmol/L (ref 3.5–5.2)
Sodium: 141 mmol/L (ref 134–144)

## 2016-04-22 LAB — CBC WITH DIFFERENTIAL/PLATELET
BASOS ABS: 0.1 10*3/uL (ref 0.0–0.2)
Basos: 1 %
EOS (ABSOLUTE): 0.2 10*3/uL (ref 0.0–0.4)
EOS: 3 %
Hematocrit: 38.8 % (ref 34.0–46.6)
Hemoglobin: 12.8 g/dL (ref 11.1–15.9)
IMMATURE GRANULOCYTES: 1 %
Immature Grans (Abs): 0.1 10*3/uL (ref 0.0–0.1)
LYMPHS ABS: 2 10*3/uL (ref 0.7–3.1)
Lymphs: 32 %
MCH: 29.2 pg (ref 26.6–33.0)
MCHC: 33 g/dL (ref 31.5–35.7)
MCV: 88 fL (ref 79–97)
MONOS ABS: 0.7 10*3/uL (ref 0.1–0.9)
Monocytes: 11 %
NEUTROS PCT: 52 %
Neutrophils Absolute: 3.5 10*3/uL (ref 1.4–7.0)
PLATELETS: 228 10*3/uL (ref 150–379)
RBC: 4.39 x10E6/uL (ref 3.77–5.28)
RDW: 14.4 % (ref 12.3–15.4)
WBC: 6.4 10*3/uL (ref 3.4–10.8)

## 2016-04-22 LAB — HEPATIC FUNCTION PANEL
ALT: 11 IU/L (ref 0–32)
AST: 16 IU/L (ref 0–40)
Albumin: 4.1 g/dL (ref 3.5–4.7)
Alkaline Phosphatase: 76 IU/L (ref 39–117)
BILIRUBIN, DIRECT: 0.06 mg/dL (ref 0.00–0.40)
Bilirubin Total: 0.2 mg/dL (ref 0.0–1.2)
TOTAL PROTEIN: 6.8 g/dL (ref 6.0–8.5)

## 2016-04-22 LAB — THYROID PANEL WITH TSH
FREE THYROXINE INDEX: 3.1 (ref 1.2–4.9)
T3 UPTAKE RATIO: 31 % (ref 24–39)
T4 TOTAL: 10 ug/dL (ref 4.5–12.0)
TSH: 0.911 u[IU]/mL (ref 0.450–4.500)

## 2016-04-26 ENCOUNTER — Encounter: Payer: Self-pay | Admitting: Family Medicine

## 2016-04-26 ENCOUNTER — Ambulatory Visit (INDEPENDENT_AMBULATORY_CARE_PROVIDER_SITE_OTHER): Payer: Medicare Other | Admitting: Family Medicine

## 2016-04-26 VITALS — BP 138/80 | HR 64 | Temp 97.0°F | Ht 63.0 in | Wt 164.0 lb

## 2016-04-26 DIAGNOSIS — H9193 Unspecified hearing loss, bilateral: Secondary | ICD-10-CM

## 2016-04-26 DIAGNOSIS — Z789 Other specified health status: Secondary | ICD-10-CM

## 2016-04-26 DIAGNOSIS — K219 Gastro-esophageal reflux disease without esophagitis: Secondary | ICD-10-CM | POA: Diagnosis not present

## 2016-04-26 DIAGNOSIS — E039 Hypothyroidism, unspecified: Secondary | ICD-10-CM

## 2016-04-26 DIAGNOSIS — E119 Type 2 diabetes mellitus without complications: Secondary | ICD-10-CM

## 2016-04-26 DIAGNOSIS — E559 Vitamin D deficiency, unspecified: Secondary | ICD-10-CM | POA: Insufficient documentation

## 2016-04-26 DIAGNOSIS — G629 Polyneuropathy, unspecified: Secondary | ICD-10-CM | POA: Diagnosis not present

## 2016-04-26 DIAGNOSIS — E785 Hyperlipidemia, unspecified: Secondary | ICD-10-CM | POA: Diagnosis not present

## 2016-04-26 DIAGNOSIS — E034 Atrophy of thyroid (acquired): Secondary | ICD-10-CM | POA: Insufficient documentation

## 2016-04-26 DIAGNOSIS — I10 Essential (primary) hypertension: Secondary | ICD-10-CM | POA: Diagnosis not present

## 2016-04-26 NOTE — Patient Instructions (Addendum)
Medicare Annual Wellness Visit  [facility] and the medical providers at [facility] strive to bring you the best medical care.  In doing so we not only want to address your current medical conditions and concerns but also to detect new conditions early and prevent illness, disease and health-related problems.    Medicare offers a yearly Wellness Visit which allows our clinical staff to assess your need for preventative services including immunizations, lifestyle education, counseling to decrease risk of preventable diseases and screening for fall risk and other medical concerns.    This visit is provided free of charge (no copay) for all Medicare recipients. The clinical pharmacists at [facility] have begun to conduct these Wellness Visits which will also include a thorough review of all your medications.    As you primary medical provider recommend that you make an appointment for your Annual Wellness Visit if you have not done so already this year.  You may set up this appointment before you leave today or you may call back ([phone]) and schedule an appointment.  Please make sure when you call that you mention that you are scheduling your Annual Wellness Visit with the clinical pharmacist so that the appointment may be made for the proper length of time.     Continue current medications. Continue good therapeutic lifestyle changes which include good diet and exercise. Fall precautions discussed with patient. If an FOBT was given today- please return it to our front desk. If you are over 15 years old - you may need Prevnar 93 or the adult Pneumonia vaccine.  **Flu shots are available--- please call and schedule a FLU-CLINIC appointment**  After your visit with [place] today you will receive a survey in the mail or online from [employer] regarding your care with [place]. Please take a moment to fill this out. Your feedback is very  important to [place] as you can help [place] better understand your patient needs as well as improve your experience and satisfaction. WE CARE ABOUT YOU!!!   We will call with the results of the B12 level as soon as that becomes available Please come by the office in the next couple weeks and bring your blood pressure monitor with you to this office with any additional readings that you have and have the nurse here to check her blood pressure manually with our cuff Continue to follow-up with gastroenterology as needed

## 2016-04-26 NOTE — Progress Notes (Signed)
Subjective:    Patient ID: Lisa Sutton, female    DOB: [birth year], 82 y.o.   MRN: [id number]  HPI Pt here for follow up and management of chronic medical problems which includes diabetes, hyperlipidemia, hypothyroid and hypertension. She is taking medications regularly.The patient today has some complaints one of which is her blood pressure. She complains of sinus congestion and numbness and pain and tingling in her legs which seems to be worse at nighttime. She also has some sleep issues and is currently taking Tylenol PM. She brings in home blood pressures for review since the end of October and the majority of the systolic readings are in the 150-170 range. The diastolics are good in the 60 range. She is currently taking 2.5 mg of amlodipine and lisinopril 20 mg and atenolol 50 mg. Also she is taking furosemide 20 mg. The patient denies any chest pain or shortness of breath. She's not had any trouble with heartburn indigestion nausea vomiting diarrhea or blood in the stool. She is passing her water without problems. She continues to take her thyroid medicine 112 daily and 25 on Monday Wednesday and Friday. Her thyroid tests are good and she will continue with this treatment regimen. She is concerned about the tingling in her feet and we'll see if we can add a B12 level to her current blood work. She was informed today about the use of sedating antihistamines and the side effects of dementia. We also encouraged her during the initial conversation to use nasal saline frequently and to continue to use the Flonase on a regular basis. She prefers not to do anything about the cholesterol at this time because she would like to give herself more recovery time from the knee replacement and if the numbers remain elevated we'll look into other options for taking care of the hyperlipidemia.     Patient Active Problem List   Diagnosis Date Noted  . Status post total left knee replacement 09/15/2015  .  Statin intolerance 09/11/2015  . Peripheral neuropathy ([place]) 07/30/2015  . Abnormal chest x-ray 11/27/2014  . Protein-calorie malnutrition, severe ([place]) 11/27/2014  . AKI (acute kidney injury) ([place]) 11/26/2014  . Hypercalcemia 11/26/2014  . Leukocytosis 11/26/2014  . Weight loss 11/26/2014  . Weakness 11/26/2014  . Insomnia 04/12/2013  . Hyperlipidemia 04/12/2013  . Essential hypertension, benign 10/13/2012  . GERD (gastroesophageal reflux disease) 04/25/2012  . Fatty liver 08/09/2011  . Hepatitis B 08/09/2011  . Dysphagia 08/09/2011  . Type 2 diabetes mellitus ([place]) 08/09/2011  . Hypothyroid 08/09/2011   Outpatient Encounter Prescriptions as of 04/26/2016  Medication Sig  . ACCU-CHEK AVIVA PLUS test strip Test blood sugar two times  daily  . ALPRAZolam (XANAX) 0.25 MG tablet Take 1 tablet (0.25 mg total) by mouth 2 (two) times daily as needed for anxiety. (Patient taking differently: Take 0.25 mg by mouth at bedtime as needed for anxiety. )  . amLODipine (NORVASC) 2.5 MG tablet Take 1 tablet (2.5 mg total) by mouth daily.  Marland Kitchen atenolol (TENORMIN) 50 MG tablet Take 1 tablet by mouth  daily  . furosemide (LASIX) 20 MG tablet Take 2 tablets (40 mg total) by mouth daily. (Patient taking differently: Take 20 mg by mouth daily. )  . levothyroxine (LEVOTHROID) 25 MCG tablet Take 1 tablet (25 mcg total) by mouth daily before breakfast.  . levothyroxine (SYNTHROID, LEVOTHROID) 112 MCG tablet Take 1 tablet by mouth  daily  . lisinopril (PRINIVIL,ZESTRIL) 20 MG tablet Take 1 tablet by mouth  daily  . metFORMIN (GLUCOPHAGE) 500 MG tablet Take 1 tablet by mouth two  times daily with meals  . Omeprazole-Sodium Bicarbonate (ZEGERID) 20-1100 MG CAPS capsule Take 1 capsule by mouth every Monday, Wednesday, and Friday.  . methocarbamol (ROBAXIN) 500 MG tablet Take 1 tablet (500 mg total) by mouth every 6 (six) hours as needed for muscle spasms. (Patient not taking: Reported on 04/26/2016)  .  [DISCONTINUED] traMADol (ULTRAM) 50 MG tablet 1/2 to whole tab QHS PRN   No facility-administered encounter medications on file as of 04/26/2016.      Review of Systems  Constitutional: Negative.   HENT: Positive for congestion and sinus pressure.   Eyes: Negative.   Respiratory: Negative.   Cardiovascular: Negative.   Gastrointestinal: Negative.   Endocrine: Negative.   Genitourinary: Negative.   Musculoskeletal: Negative.   Skin: Negative.   Allergic/Immunologic: Negative.   Neurological: Positive for numbness (bilateral feet - numb, tingle, pain  - worse at night) and headaches.  Hematological: Negative.   Psychiatric/Behavioral: Negative.        Objective:   Physical Exam  Constitutional: She is oriented to person, place, and time. She appears well-developed and well-nourished. No distress.  HENT:  Head: Normocephalic and atraumatic.  Right Ear: External ear normal.  Left Ear: External ear normal.  Nose: Nose normal.  Mouth/Throat: Oropharynx is clear and moist. No oropharyngeal exudate.  Eyes: Conjunctivae and EOM are normal. Pupils are equal, round, and reactive to light. Right eye exhibits no discharge. Left eye exhibits no discharge. No scleral icterus.  Neck: Normal range of motion. Neck supple. No thyromegaly present.  No bruits thyromegaly or anterior cervical adenopathy  Cardiovascular: Normal rate, regular rhythm, normal heart sounds and intact distal pulses.   No murmur heard. 72/m with a regular rate and rhythm  Pulmonary/Chest: Effort normal and breath sounds normal. No respiratory distress. She has no wheezes. She has no rales.  Abdominal: Soft. Bowel sounds are normal. She exhibits no mass. There is no tenderness. There is no rebound and no guarding.  No abdominal tenderness or organ enlargement bruits or inguinal adenopathy  Musculoskeletal: Normal range of motion. She exhibits no edema.  Status post left knee replacement and recovering well from this.    Lymphadenopathy:    She has no cervical adenopathy.  Neurological: She is alert and oriented to person, place, and time. She has normal reflexes. No cranial nerve deficit.  Skin: Skin is warm and dry. No rash noted.  Psychiatric: She has a normal mood and affect. Her behavior is normal. Judgment and thought content normal.  Nursing note and vitals reviewed.  BP 138/80 (BP Location: Left Arm)   Pulse 64   Temp 97 F (36.1 C) (Oral)   Ht 5\' 3"  (1.6 m)   Wt 164 lb (74.4 kg)   BMI 29.05 kg/m         Assessment & Plan:  1. Hyperlipidemia, unspecified hyperlipidemia type -The patient is statin intolerant. She will continue with aggressive therapeutic lifestyle changes. We may consider trying some fenofibrate 1 she becomes more active and if the cholesterol does not come down any better than it is now. She is agreeable to this. We might even want to consider trying some Repatha but we will wait until we check the cholesterol numbers at the next visit.  2. Essential hypertension, benign -The patient's blood pressure readings at home were elevated the blood pressure here with the automatic cuff was normal. A repeat blood pressure by me  manually had an elevated blood pressure at 158/88. She will bring her cuff in and have it coordinated with our cuff. She'll continue to watch her sodium intake.  3. Hypothyroidism, unspecified type -All thyroid function tests were good and she will stay on her current treatment regimen  4. Type 2 diabetes mellitus without complication, without long-term current use of insulin (HCC) -Her hemoglobin A1c was good and she will continue to take her current treatment regimen and continue with as aggressive therapeutic lifestyle changes as possible  5. Vitamin D deficiency -Continue with current treatment  6. Gastroesophageal reflux disease without esophagitis -No complaints with this today she will continue to follow-up with gastroenterology  7. Statin  intolerance -The patient is intolerant of most statins. We may consider adding back some fenofibrate but we will wait until rechecked the blood work at the next visit and give her time to recuperate more from her knee replacement.  8. Hypercalcemia -This was normal today.  9. Peripheral polyneuropathy ([place]) -The patient continues to have some tingling in her lower extremities and numbness. We will add a B12 level.  10. Hypothyroidism due to acquired atrophy of thyroid -All thyroid function tests were normal  11. Bilateral hearing loss, unspecified hearing loss type -We will arrange a visit with Dr. Hermina Barters - Ambulatory referral to ENT  Patient Instructions                       Medicare Annual Wellness Visit  [facility] and the medical providers at [facility] strive to bring you the best medical care.  In doing so we not only want to address your current medical conditions and concerns but also to detect new conditions early and prevent illness, disease and health-related problems.    Medicare offers a yearly Wellness Visit which allows our clinical staff to assess your need for preventative services including immunizations, lifestyle education, counseling to decrease risk of preventable diseases and screening for fall risk and other medical concerns.    This visit is provided free of charge (no copay) for all Medicare recipients. The clinical pharmacists at [facility] have begun to conduct these Wellness Visits which will also include a thorough review of all your medications.    As you primary medical provider recommend that you make an appointment for your Annual Wellness Visit if you have not done so already this year.  You may set up this appointment before you leave today or you may call back ([phone]) and schedule an appointment.  Please make sure when you call that you mention that you are scheduling your Annual Wellness Visit  with the clinical pharmacist so that the appointment may be made for the proper length of time.     Continue current medications. Continue good therapeutic lifestyle changes which include good diet and exercise. Fall precautions discussed with patient. If an FOBT was given today- please return it to our front desk. If you are over 62 years old - you may need Prevnar 11 or the adult Pneumonia vaccine.  **Flu shots are available--- please call and schedule a FLU-CLINIC appointment**  After your visit with [place] today you will receive a survey in the mail or online from [employer] regarding your care with [place]. Please take a moment to fill this out. Your feedback is very important to [place] as you can help [place] better understand your patient needs as well as improve your experience and satisfaction. WE  CARE ABOUT YOU!!!   We will call with the results of the B12 level as soon as that becomes available Please come by the office in the next couple weeks and bring your blood pressure monitor with you to this office with any additional readings that you have and have the nurse here to check her blood pressure manually with our cuff Continue to follow-up with gastroenterology as needed  Arrie Senate MD

## 2016-04-28 LAB — VITAMIN B12

## 2016-04-28 LAB — SPECIMEN STATUS REPORT

## 2016-05-03 ENCOUNTER — Other Ambulatory Visit: Payer: Self-pay | Admitting: Family Medicine

## 2016-05-04 NOTE — Progress Notes (Signed)
LFTs are normal

## 2016-05-06 ENCOUNTER — Other Ambulatory Visit: Payer: Self-pay | Admitting: *Deleted

## 2016-05-06 MED ORDER — METFORMIN HCL 500 MG PO TABS: ORAL_TABLET | ORAL | 1 refills | Status: DC

## 2016-05-12 ENCOUNTER — Ambulatory Visit (INDEPENDENT_AMBULATORY_CARE_PROVIDER_SITE_OTHER): Payer: Medicare Other | Admitting: Orthopaedic Surgery

## 2016-05-14 ENCOUNTER — Ambulatory Visit (INDEPENDENT_AMBULATORY_CARE_PROVIDER_SITE_OTHER): Payer: Medicare Other | Admitting: Orthopaedic Surgery

## 2016-05-29 ENCOUNTER — Other Ambulatory Visit: Payer: Self-pay | Admitting: Family Medicine

## 2016-06-02 ENCOUNTER — Ambulatory Visit (INDEPENDENT_AMBULATORY_CARE_PROVIDER_SITE_OTHER): Payer: Medicare Other | Admitting: Orthopaedic Surgery

## 2016-06-02 ENCOUNTER — Ambulatory Visit (INDEPENDENT_AMBULATORY_CARE_PROVIDER_SITE_OTHER): Payer: Medicare Other

## 2016-06-02 ENCOUNTER — Encounter (INDEPENDENT_AMBULATORY_CARE_PROVIDER_SITE_OTHER): Payer: Self-pay | Admitting: Orthopaedic Surgery

## 2016-06-02 VITALS — BP 155/81 | HR 64 | Ht 63.0 in | Wt 162.0 lb

## 2016-06-02 DIAGNOSIS — M25561 Pain in right knee: Secondary | ICD-10-CM

## 2016-06-02 NOTE — Progress Notes (Signed)
Office Visit Note   Patient: Lisa Sutton           Date of Birth: 81-26-1935           MRN: [id number] Visit Date: 06/02/2016              Requested by: [name], MD [address] [place], [place] [address] PCP: Redge Gainer, MD   Assessment & Plan: Visit Diagnoses:  1. Acute pain of right knee          Chondromalacia patella, symptomatic  Plan: Patient can take some Aleve 2 by mouth twice a day if she continues to have increased problems she can return to consider cortisone injection. She has type 2 diabetes and is just on just metformin metformin and states her last A1c was 5.7.  Follow-Up Instructions: No Follow-up on file.   Orders:  Orders Placed This Encounter  Procedures  . XR KNEE 3 VIEW RIGHT   No orders of the defined types were placed in this encounter.     Procedures: No procedures performed   Clinical Data: No additional findings.   Subjective: Chief Complaint  Patient presents with  . Right Knee - Pain    Patient is here for right knee pain x 1 month. She states the pain is behind her knee cap. She denies any injury.     Review of Systems  Constitutional: Negative for chills and diaphoresis.  HENT: Negative for ear discharge, ear pain and nosebleeds.   Eyes: Negative for discharge and visual disturbance.  Respiratory: Negative for cough, choking and shortness of breath.   Cardiovascular: Negative for chest pain and palpitations.  Gastrointestinal: Negative for abdominal distention and abdominal pain.  Endocrine: Negative for cold intolerance and heat intolerance.       Type 2 diabetes on metformin A1c is 5.7  Genitourinary: Negative for flank pain and hematuria.  Musculoskeletal:       Previous left total knee arthroplasty occasionally sore the end of the day she's been very active no swelling. Right knee pain with the bending, squats, and stairs  Skin: Negative for rash and wound.  Neurological: Negative for seizures and speech  difficulty.  Hematological: Negative for adenopathy. Does not bruise/bleed easily.  Psychiatric/Behavioral: Negative for agitation and suicidal ideas.     Objective: Vital Signs: BP (!) 155/81   Pulse 64   Ht 5\' 3"  (1.6 m)   Wt 162 lb (73.5 kg)   BMI 28.70 kg/m   Physical Exam  Constitutional: She is oriented to person, place, and time. She appears well-developed.  HENT:  Head: Normocephalic.  Right Ear: External ear normal.  Left Ear: External ear normal.  Eyes: Pupils are equal, round, and reactive to light.  Neck: No tracheal deviation present. No thyromegaly present.  Cardiovascular: Normal rate.   Pulmonary/Chest: Effort normal.  Abdominal: Soft.  Musculoskeletal:  Well-healed left total knee arthroplasty incision. Collateral ligament exam is normal with good balance. Full extension flexion 120. Negative drawer. Right knee shows crepitus with extension pain with patellar loading positive patellar grind test. Negative Lachman. Full extension flexion to 1:30 degrees. Negative straight leg raising.  Neurological: She is alert and oriented to person, place, and time.  Skin: Skin is warm and dry.  Psychiatric: She has a normal mood and affect. Her behavior is normal.    Ortho Exam reflexes and hip range of motion is normal bilaterally. Good palpable pulses no distal venous stasis changes no pitting edema anterior tib  EHL is strong.  Specialty Comments:  No specialty comments available.  Imaging: Xr Knee 3 View Right  Result Date: 06/02/2016 Standing x-rays both the and lateral of right knee and bilateral knee sunrise x-rays are reviewed. This shows well positioned left total knee arthroplasty. Patellofemoral degenerative changes of the right knee without significant joint space narrowing on standing x-rays. Impression: Post left total knee arthroplasty no complications. Right knee patellofemoral degenerative changes    PMFS History: Patient Active Problem List    Diagnosis Date Noted  . Acute pain of right knee 06/02/2016  . Hypothyroidism due to acquired atrophy of thyroid 04/26/2016  . Vitamin D deficiency 04/26/2016  . Status post total left knee replacement 09/15/2015  . Statin intolerance 09/11/2015  . Peripheral neuropathy ([place]) 07/30/2015  . Abnormal chest x-ray 11/27/2014  . Protein-calorie malnutrition, severe ([place]) 11/27/2014  . AKI (acute kidney injury) ([place]) 11/26/2014  . Hypercalcemia 11/26/2014  . Leukocytosis 11/26/2014  . Weight loss 11/26/2014  . Weakness 11/26/2014  . Insomnia 04/12/2013  . Hyperlipidemia 04/12/2013  . Essential hypertension, benign 10/13/2012  . GERD (gastroesophageal reflux disease) 04/25/2012  . Fatty liver 08/09/2011  . Hepatitis B 08/09/2011  . Dysphagia 08/09/2011  . Type 2 diabetes mellitus ([place]) 08/09/2011  . Hypothyroid 08/09/2011   Past Medical History:  Diagnosis Date  . Anxiety   . Arthritis   . Cataract   . Dysphagia    'sometimes but not a major issue' been checked out by GI (per pt)  . Dysrhythmia    'heart used to skip but doesn't anymore' was checked out by Dr. Ron Parker late '90s, everything checked out ok and not had any skipping since (all per pt)  . Essential hypertension, benign   . Fatty liver   . GERD (gastroesophageal reflux disease)   . Hepatitis B    had at age 18, 'GI doc said it's gone away'  . Hepatitis B surface antigen positive   . History of hiatal hernia   . Hypercalcemia   . Hypothyroidism   . Kidney stone August 2014   Patient was [facility]  . Obesity   . Osteoporosis   . PONV (postoperative nausea and vomiting)   . Type 2 diabetes mellitus (HCC)     Family History  Problem Relation Age of Onset  . Hypertension Father   . Hip fracture Father   . Cancer Maternal Aunt   . Diabetes Maternal Aunt   . Diabetes Maternal Uncle     Past Surgical History:  Procedure Laterality Date  . ABDOMINAL HYSTERECTOMY    . BILATERAL CARPAL TUNNEL RELEASE     Dr.  Marlou Sa at surgical center  . CATARACT EXTRACTION, BILATERAL    . Deviated septum repair    . EYE SURGERY    . KNEE ARTHROPLASTY Left 09/15/2015   Procedure: COMPUTER ASSISTED TOTAL KNEE ARTHROPLASTY;  Surgeon: Marybelle Killings, MD;  Location: [facility];  Service: Orthopedics;  Laterality: Left;  . KNEE ARTHROSCOPY     left knee 2003   Social History   Occupational History  . Not on file.   Social History Main Topics  . Smoking status: Never Smoker  . Smokeless tobacco: Never Used  . Alcohol use No  . Drug use: No  . Sexual activity: No

## 2016-06-04 DIAGNOSIS — G4489 Other headache syndrome: Secondary | ICD-10-CM | POA: Diagnosis not present

## 2016-06-04 DIAGNOSIS — H9113 Presbycusis, bilateral: Secondary | ICD-10-CM | POA: Diagnosis not present

## 2016-06-14 DIAGNOSIS — Z1231 Encounter for screening mammogram for malignant neoplasm of breast: Secondary | ICD-10-CM | POA: Diagnosis not present

## 2016-06-15 DIAGNOSIS — Z79899 Other long term (current) drug therapy: Secondary | ICD-10-CM | POA: Diagnosis not present

## 2016-06-15 DIAGNOSIS — N183 Chronic kidney disease, stage 3 (moderate): Secondary | ICD-10-CM | POA: Diagnosis not present

## 2016-06-15 DIAGNOSIS — I10 Essential (primary) hypertension: Secondary | ICD-10-CM | POA: Diagnosis not present

## 2016-06-15 DIAGNOSIS — D509 Iron deficiency anemia, unspecified: Secondary | ICD-10-CM | POA: Diagnosis not present

## 2016-06-15 DIAGNOSIS — R809 Proteinuria, unspecified: Secondary | ICD-10-CM | POA: Diagnosis not present

## 2016-06-23 ENCOUNTER — Other Ambulatory Visit: Payer: Self-pay | Admitting: *Deleted

## 2016-06-23 DIAGNOSIS — I10 Essential (primary) hypertension: Secondary | ICD-10-CM | POA: Diagnosis not present

## 2016-06-23 DIAGNOSIS — G8929 Other chronic pain: Secondary | ICD-10-CM

## 2016-06-23 DIAGNOSIS — E1129 Type 2 diabetes mellitus with other diabetic kidney complication: Secondary | ICD-10-CM | POA: Diagnosis not present

## 2016-06-23 DIAGNOSIS — R51 Headache: Principal | ICD-10-CM

## 2016-06-23 DIAGNOSIS — R519 Headache, unspecified: Secondary | ICD-10-CM

## 2016-06-23 DIAGNOSIS — N179 Acute kidney failure, unspecified: Secondary | ICD-10-CM | POA: Diagnosis not present

## 2016-06-23 DIAGNOSIS — E569 Vitamin deficiency, unspecified: Secondary | ICD-10-CM | POA: Diagnosis not present

## 2016-06-29 ENCOUNTER — Encounter (INDEPENDENT_AMBULATORY_CARE_PROVIDER_SITE_OTHER): Payer: Self-pay | Admitting: Internal Medicine

## 2016-06-29 ENCOUNTER — Ambulatory Visit (INDEPENDENT_AMBULATORY_CARE_PROVIDER_SITE_OTHER): Payer: Medicare Other | Admitting: Internal Medicine

## 2016-06-29 ENCOUNTER — Encounter (INDEPENDENT_AMBULATORY_CARE_PROVIDER_SITE_OTHER): Payer: Self-pay | Admitting: *Deleted

## 2016-06-29 VITALS — BP 168/90 | HR 64 | Temp 98.1°F | Ht 63.0 in | Wt 166.6 lb

## 2016-06-29 DIAGNOSIS — K922 Gastrointestinal hemorrhage, unspecified: Secondary | ICD-10-CM

## 2016-06-29 LAB — CBC WITH DIFFERENTIAL/PLATELET
Basophils Absolute: 79 cells/uL (ref 0–200)
Basophils Relative: 1 %
EOS PCT: 2 %
Eosinophils Absolute: 158 cells/uL (ref 15–500)
HCT: 41.1 % (ref 35.0–45.0)
Hemoglobin: 13.6 g/dL (ref 11.7–15.5)
Lymphocytes Relative: 29 %
Lymphs Abs: 2291 cells/uL (ref 850–3900)
MCH: 29.4 pg (ref 27.0–33.0)
MCHC: 33.1 g/dL (ref 32.0–36.0)
MCV: 88.8 fL (ref 80.0–100.0)
MONOS PCT: 11 %
MPV: 8.9 fL (ref 7.5–12.5)
Monocytes Absolute: 869 cells/uL (ref 200–950)
NEUTROS ABS: 4503 {cells}/uL (ref 1500–7800)
NEUTROS PCT: 57 %
PLATELETS: 314 10*3/uL (ref 140–400)
RBC: 4.63 MIL/uL (ref 3.80–5.10)
RDW: 14.2 % (ref 11.0–15.0)
WBC: 7.9 10*3/uL (ref 3.8–10.8)

## 2016-06-29 NOTE — Patient Instructions (Signed)
CBC today.  

## 2016-06-29 NOTE — Progress Notes (Signed)
Please make sure that this patient has had a recent chest x-ray with a history of spitting up blood. Please get chest x-ray if she has not had one in the past year.

## 2016-06-29 NOTE — Progress Notes (Signed)
Subjective:    Patient ID: Lisa Sutton, female    DOB: 1933-11-15, 83 y.o.   MRN: [id number]  HPIPresents today with c/o spitting up blood. Last seen by Dr. Laural Golden in October for a scheduled visit. Hx of dysphagia.  She says in the morning, she spits up ? Blood.  She thought it was her sinuses. She saw ENT about a month ago and exam was normal.  She says she see a couple of drops in the morning and does not reoccur. Symptoms for about 6 weeks.  Her BMs are brown in color. No change in stools.  Keeps regular dental appts.  Gums are not sore.  She says at night one nostril becomes clogged. Hx of fatty liver.  Left knee replacement 09/15/2015.  Family hx negative for CRC.    08/18/2011 EGD/ED: dysphagia Impression: Soft stricture at GE junction. Small sliding hiatal hernia. Esophageal dilation performed by passing 56 French Maloney dilator disrupting the mucosa at GE junction as well as below UES indicative of esophageal web.  Hepatic Function Latest Ref Rng & Units 04/21/2016 09/03/2015 06/04/2015  Total Protein 6.0 - 8.5 g/dL 6.8 7.2 7.1  Albumin 3.5 - 4.7 g/dL 4.1 3.8 4.3  AST 0 - 40 IU/L _0 ALT 0 - 32 IU/L _1 Alk Phosphatase 39 - 117 IU/L 76 67 93  Total Bilirubin 0.0 - 1.2 mg/dL 0.2 0.3 <0.2  Bilirubin, Direct 0.00 - 0.40 mg/dL 0.06 - 0.09     CBC    Component Value Date/Time   WBC 6.4 04/21/2016 0812   WBC 9.6 09/18/2015 0526   RBC 4.39 04/21/2016 0812   RBC 3.24 (L) 09/18/2015 0526   HGB 9.8 (L) 09/18/2015 0526   HCT 38.8 04/21/2016 0812   PLT 228 04/21/2016 0812   MCV 88 04/21/2016 0812   MCH 29.2 04/21/2016 0812   MCH 30.2 09/18/2015 0526   MCHC 33.0 04/21/2016 0812   MCHC 32.9 09/18/2015 0526   RDW 14.4 04/21/2016 0812   LYMPHSABS 2.0 04/21/2016 0812   MONOABS 0.8 04/28/2015 1149   EOSABS 0.2 04/21/2016 0812   BASOSABS 0.1 04/21/2016 0812         Review of Systems Past Medical History:  Diagnosis Date  . Anxiety   . Arthritis     . Cataract   . Dysphagia    'sometimes but not a major issue' been checked out by GI (per pt)  . Dysrhythmia    'heart used to skip but doesn't anymore' was checked out by Dr. Ron Parker late '90s, everything checked out ok and not had any skipping since (all per pt)  . Essential hypertension, benign   . Fatty liver   . GERD (gastroesophageal reflux disease)   . Hepatitis B    had at age 41, 'GI doc said it's gone away'  . Hepatitis B surface antigen positive   . History of hiatal hernia   . Hypercalcemia   . Hypothyroidism   . Kidney stone August 2014   Patient was seen at [facility]  . Obesity   . Osteoporosis   . PONV (postoperative nausea and vomiting)   . Type 2 diabetes mellitus ([place])     Past Surgical History:  Procedure Laterality Date  . ABDOMINAL HYSTERECTOMY    . BILATERAL CARPAL TUNNEL RELEASE     Dr. Marlou Sa at surgical center  . CATARACT EXTRACTION, BILATERAL    . Deviated septum repair    . EYE SURGERY    .  KNEE ARTHROPLASTY Left 09/15/2015   Procedure: COMPUTER ASSISTED TOTAL KNEE ARTHROPLASTY;  Surgeon: Mark C Yates, MD;  Location: [facility] OR;  Service: Orthopedics;  Laterality: Left;  . KNEE ARTHROSCOPY     left knee 2003    Allergies  Allergen Reactions  . Statins Other (See Comments)    'discomfort, aching everywhere'  . Zetia [Ezetimibe] Other (See Comments)    Leg pain  . Livalo [Pitavastatin] Other (See Comments)    Leg pain  . Zocor [Simvastatin] Other (See Comments)    Leg pain    Current Outpatient Prescriptions on File Prior to Visit  Medication Sig Dispense Refill  . ALPRAZolam (XANAX) 0.25 MG tablet Take 1 tablet (0.25 mg total) by mouth 2 (two) times daily as needed for anxiety. (Patient taking differently: Take 0.25 mg by mouth at bedtime as needed for anxiety. ) 180 tablet 0  . amLODipine (NORVASC) 2.5 MG tablet TAKE 1 TABLET BY MOUTH  DAILY 90 tablet 2  . atenolol (TENORMIN) 50 MG tablet TAKE 1 TABLET BY MOUTH  DAILY 90 tablet 1  . furosemide  (LASIX) 20 MG tablet Take 2 tablets (40 mg total) by mouth daily. (Patient taking differently: Take 20 mg by mouth daily. ) 180 tablet 3  . levothyroxine (LEVOTHROID) 25 MCG tablet Take 1 tablet (25 mcg total) by mouth daily before breakfast. 90 tablet 3  . levothyroxine (SYNTHROID, LEVOTHROID) 112 MCG tablet Take 1 tablet by mouth  daily 90 tablet 1  . lisinopril (PRINIVIL,ZESTRIL) 20 MG tablet TAKE 1 TABLET BY MOUTH  DAILY 90 tablet 1  . metFORMIN (GLUCOPHAGE) 500 MG tablet Take 1 tablet by mouth two  times daily with meals 180 tablet 1  . Omeprazole-Sodium Bicarbonate (ZEGERID) 20-1100 MG CAPS capsule Take 1 capsule by mouth every Monday, Wednesday, and Friday. (Patient taking differently: Take 1 capsule by mouth as needed. ) 28 each   . ACCU-CHEK AVIVA PLUS test strip Test blood sugar two times  daily 200 each 1  . [DISCONTINUED] calcium citrate-vitamin D 200-200 MG-UNIT TABS Take 1 tablet by mouth daily.       No current facility-administered medications on file prior to visit.        Objective:   Physical Exam Blood pressure (!) 168/90, pulse 64, temperature 98.1 F (36.7 C), height 5' 3" (1.6 m), weight 166 lb 9.6 oz (75.6 kg). Alert and oriented. Skin warm and dry. Oral mucosa is moist.   . Sclera anicteric, conjunctivae is pink. Thyroid not enlarged. No cervical lymphadenopathy. Lungs clear. Heart regular rate and rhythm.  Abdomen is soft. Bowel sounds are positive. No hepatomegaly. No abdominal masses felt. No tenderness.  No edema to lower extremities.  Stool brown and guaiac negative.        Assessment & Plan:  Possible UGI bleed. Small amt noted in am per patient. Does not vomit. Am going to get a CBC 

## 2016-07-06 ENCOUNTER — Ambulatory Visit (HOSPITAL_COMMUNITY)
Admission: RE | Admit: 2016-07-06 | Discharge: 2016-07-06 | Disposition: A | Discharge status: Home / Self Care | Payer: Medicare Other | Source: Ambulatory Visit | Attending: Internal Medicine | Admitting: Internal Medicine

## 2016-07-06 DIAGNOSIS — K922 Gastrointestinal hemorrhage, unspecified: Secondary | ICD-10-CM | POA: Diagnosis not present

## 2016-07-06 DIAGNOSIS — N281 Cyst of kidney, acquired: Secondary | ICD-10-CM | POA: Diagnosis not present

## 2016-07-07 DIAGNOSIS — H903 Sensorineural hearing loss, bilateral: Secondary | ICD-10-CM | POA: Diagnosis not present

## 2016-07-13 ENCOUNTER — Other Ambulatory Visit (INDEPENDENT_AMBULATORY_CARE_PROVIDER_SITE_OTHER): Payer: Self-pay | Admitting: Internal Medicine

## 2016-07-13 ENCOUNTER — Ambulatory Visit (HOSPITAL_COMMUNITY)
Admission: RE | Admit: 2016-07-13 | Discharge: 2016-07-13 | Disposition: A | Discharge status: Home / Self Care | Payer: Medicare Other | Source: Ambulatory Visit | Attending: Internal Medicine | Admitting: Internal Medicine

## 2016-07-13 DIAGNOSIS — R042 Hemoptysis: Secondary | ICD-10-CM | POA: Diagnosis not present

## 2016-07-13 DIAGNOSIS — R05 Cough: Secondary | ICD-10-CM | POA: Diagnosis not present

## 2016-07-15 ENCOUNTER — Encounter (INDEPENDENT_AMBULATORY_CARE_PROVIDER_SITE_OTHER): Payer: Self-pay | Admitting: *Deleted

## 2016-07-16 ENCOUNTER — Other Ambulatory Visit (INDEPENDENT_AMBULATORY_CARE_PROVIDER_SITE_OTHER): Payer: Self-pay | Admitting: *Deleted

## 2016-07-16 ENCOUNTER — Other Ambulatory Visit: Payer: Self-pay | Admitting: Family Medicine

## 2016-07-16 DIAGNOSIS — I10 Essential (primary) hypertension: Secondary | ICD-10-CM

## 2016-07-16 DIAGNOSIS — R042 Hemoptysis: Secondary | ICD-10-CM | POA: Insufficient documentation

## 2016-07-28 ENCOUNTER — Encounter (HOSPITAL_COMMUNITY)
Admission: RE | Disposition: A | Discharge status: Home / Self Care | Payer: Self-pay | Source: Ambulatory Visit | Attending: Internal Medicine

## 2016-07-28 ENCOUNTER — Ambulatory Visit (HOSPITAL_COMMUNITY)
Admission: RE | Admit: 2016-07-28 | Discharge: 2016-07-28 | Disposition: A | Discharge status: Home / Self Care | Payer: Medicare Other | Source: Ambulatory Visit | Attending: Internal Medicine | Admitting: Internal Medicine

## 2016-07-28 ENCOUNTER — Encounter (HOSPITAL_COMMUNITY): Payer: Self-pay | Admitting: *Deleted

## 2016-07-28 DIAGNOSIS — E119 Type 2 diabetes mellitus without complications: Secondary | ICD-10-CM | POA: Diagnosis not present

## 2016-07-28 DIAGNOSIS — Z96652 Presence of left artificial knee joint: Secondary | ICD-10-CM | POA: Diagnosis not present

## 2016-07-28 DIAGNOSIS — Z7982 Long term (current) use of aspirin: Secondary | ICD-10-CM | POA: Diagnosis not present

## 2016-07-28 DIAGNOSIS — E039 Hypothyroidism, unspecified: Secondary | ICD-10-CM | POA: Diagnosis not present

## 2016-07-28 DIAGNOSIS — F419 Anxiety disorder, unspecified: Secondary | ICD-10-CM | POA: Insufficient documentation

## 2016-07-28 DIAGNOSIS — E669 Obesity, unspecified: Secondary | ICD-10-CM | POA: Diagnosis not present

## 2016-07-28 DIAGNOSIS — Z7984 Long term (current) use of oral hypoglycemic drugs: Secondary | ICD-10-CM | POA: Diagnosis not present

## 2016-07-28 DIAGNOSIS — K922 Gastrointestinal hemorrhage, unspecified: Secondary | ICD-10-CM | POA: Diagnosis not present

## 2016-07-28 DIAGNOSIS — K210 Gastro-esophageal reflux disease with esophagitis: Secondary | ICD-10-CM | POA: Diagnosis not present

## 2016-07-28 DIAGNOSIS — Z79899 Other long term (current) drug therapy: Secondary | ICD-10-CM | POA: Insufficient documentation

## 2016-07-28 DIAGNOSIS — Z6829 Body mass index (BMI) 29.0-29.9, adult: Secondary | ICD-10-CM | POA: Insufficient documentation

## 2016-07-28 DIAGNOSIS — K920 Hematemesis: Secondary | ICD-10-CM | POA: Diagnosis not present

## 2016-07-28 DIAGNOSIS — K449 Diaphragmatic hernia without obstruction or gangrene: Secondary | ICD-10-CM | POA: Insufficient documentation

## 2016-07-28 DIAGNOSIS — K3189 Other diseases of stomach and duodenum: Secondary | ICD-10-CM | POA: Diagnosis not present

## 2016-07-28 DIAGNOSIS — I10 Essential (primary) hypertension: Secondary | ICD-10-CM | POA: Insufficient documentation

## 2016-07-28 DIAGNOSIS — R042 Hemoptysis: Secondary | ICD-10-CM | POA: Insufficient documentation

## 2016-07-28 DIAGNOSIS — M810 Age-related osteoporosis without current pathological fracture: Secondary | ICD-10-CM | POA: Insufficient documentation

## 2016-07-28 HISTORY — PX: ESOPHAGOGASTRODUODENOSCOPY: SHX5428

## 2016-07-28 LAB — GLUCOSE, CAPILLARY: Glucose-Capillary: 103 mg/dL — ABNORMAL HIGH (ref 65–99)

## 2016-07-28 SURGERY — EGD (ESOPHAGOGASTRODUODENOSCOPY)
Anesthesia: Moderate Sedation

## 2016-07-28 MED ORDER — MEPERIDINE HCL 50 MG/ML IJ SOLN
INTRAMUSCULAR | Status: AC
  Filled 2016-07-28: qty 1

## 2016-07-28 MED ORDER — MIDAZOLAM HCL 5 MG/5ML IJ SOLN
INTRAMUSCULAR | Status: DC | PRN
  Administered 2016-07-28: 2 mg via INTRAVENOUS
  Administered 2016-07-28: 1 mg via INTRAVENOUS
  Administered 2016-07-28: 2 mg via INTRAVENOUS

## 2016-07-28 MED ORDER — SODIUM CHLORIDE 0.9 % IV SOLN
INTRAVENOUS | Status: DC
  Administered 2016-07-28: 1000 mL via INTRAVENOUS

## 2016-07-28 MED ORDER — LIDOCAINE VISCOUS 2 % MT SOLN
OROMUCOSAL | Status: AC
  Filled 2016-07-28: qty 15

## 2016-07-28 MED ORDER — MEPERIDINE HCL 50 MG/ML IJ SOLN
INTRAMUSCULAR | Status: DC | PRN
  Administered 2016-07-28 (×2): 25 mg via INTRAVENOUS

## 2016-07-28 MED ORDER — LIDOCAINE HCL 2 % EX GEL
CUTANEOUS | Status: DC | PRN
  Administered 2016-07-28: 1

## 2016-07-28 MED ORDER — MIDAZOLAM HCL 5 MG/5ML IJ SOLN
INTRAMUSCULAR | Status: AC
  Filled 2016-07-28: qty 10

## 2016-07-28 MED ORDER — PANTOPRAZOLE SODIUM 40 MG PO TBEC: 40.0000 mg | DELAYED_RELEASE_TABLET | Freq: Two times a day (BID) | ORAL | 2 refills | Status: DC

## 2016-07-28 NOTE — Op Note (Signed)
[facility] Patient Name: Lisa Sutton Procedure Date: 07/28/2016 11:50 AM MRN: [id number] Date of Birth: [birth year] Attending MD: [name] , MD CSN: [id number] Age: 83 Admit Type: Outpatient Procedure:                Upper GI endoscopy Indications:              Suspected upper gastrointestinal bleeding, OTHER Providers:                [name], MD, Otis Peak B. Sharon Seller, RN, Aram Candela Referring MD:             [name], MD Medicines:                Viscous Lidocaine for oropharyngeal topical                            anesthesia. Meperidine 50 mg IV, Midazolam 5 mg IV Complications:            No immediate complications. Estimated Blood Loss:     Estimated blood loss: none. Procedure:                Pre-Anesthesia Assessment:                           - Prior to the procedure, a History and Physical                            was performed, and patient medications and                            allergies were reviewed. The patient's tolerance of                            previous anesthesia was also reviewed. The risks                            and benefits of the procedure and the sedation                            options and risks were discussed with the patient.                            All questions were answered, and informed consent                            was obtained. Prior Anticoagulants: The patient                            last took aspirin 4 days and previous NSAID                            medication 1 day prior to the procedure. ASA Grade  Assessment: II - A patient with mild systemic                            disease. After reviewing the risks and benefits,                            the patient was deemed in satisfactory condition to                            undergo the procedure.                           After obtaining informed consent, the endoscope was                             passed under direct vision. Throughout the                            procedure, the patient's blood pressure, pulse, and                            oxygen saturations were monitored continuously. The                            EG-[id number] ([id number]) scope was introduced through the                            mouth, and advanced to the second part of duodenum.                            The upper GI endoscopy was accomplished without                            difficulty. The patient tolerated the procedure                            well. Scope In: 12:42:44 PM Scope Out: 12:49:37 PM Total Procedure Duration: 0 hours 6 minutes 53 seconds  Findings:      The examined esophagus was normal.      LA Grade A (one or more mucosal breaks less than 5 mm, not extending       between tops of 2 mucosal folds) esophagitis was found 33 cm from the       incisors.      A 3 cm hiatal hernia was present.      Patchy mildly erythematous mucosa was found in the prepyloric region of       the stomach and at the pylorus.      The exam of the stomach was otherwise normal.      The duodenal bulb and second portion of the duodenum were normal. Impression:               - Normal esophagus.                           - LA Grade A reflux esophagitis.                           -  3 cm hiatal hernia.                           - Erythematous mucosa in the prepyloric region of                            the stomach and pylorus.                           - Normal duodenal bulb and second portion of the                            duodenum.                           - No specimens collected. Moderate Sedation:      Moderate (conscious) sedation was administered by the endoscopy nurse       and supervised by the endoscopist. The following parameters were       monitored: oxygen saturation, heart rate, blood pressure, CO2       capnography and response to care. Total physician intraservice time was       11  minutes. Recommendation:           - Patient has a contact number available for                            emergencies. The signs and symptoms of potential                            delayed complications were discussed with the                            patient. Return to normal activities tomorrow.                            Written discharge instructions were provided to the                            patient.                           - Resume previous diet today.                           - Continue present medications.                           - Resume aspirin at prior dose today.                           - Discontinue Zegrid.                           - Use Protonix (pantoprazole) 40 mg PO BID.                           - Progress report in  2 weeks.                           Comment:no definite source of patient's blood                            tinged saliva found. If she does not respond to                            aggressive antireflux therapy she will need                            bronchoscopy. Procedure Code(s):        --- Professional ---                           [phone], Esophagogastroduodenoscopy, flexible,                            transoral; diagnostic, including collection of                            specimen(s) by brushing or washing, when performed                            (separate procedure)                           99152, Moderate sedation services provided by the                            same physician or other qualified health care                            professional performing the diagnostic or                            therapeutic service that the sedation supports,                            requiring the presence of an independent trained                            observer to assist in the monitoring of the                            patient's level of consciousness and physiological                            status; initial 15 minutes  of intraservice time,                            patient age 63 years or older Diagnosis Code(s):        --- Professional ---  K21.0, Gastro-esophageal reflux disease with                            esophagitis                           K44.9, Diaphragmatic hernia without obstruction or                            gangrene                           K31.89, Other diseases of stomach and duodenum CPT copyright 2016 American Medical Association. All rights reserved. The codes documented in this report are preliminary and upon coder review may  be revised to meet current compliance requirements. [name], MD [name], MD 07/28/2016 1:10:22 PM This report has been signed electronically. Number of Addenda: 0

## 2016-07-28 NOTE — Discharge Instructions (Signed)
Discontinue Zegrid. Begin pantoprazole 40 mg by mouth 30 minutes before breakfast and evening meal daily. Resume other medications and diet as before. No driving for 24 hours. Progress report in 2 weeks.   Esophagogastroduodenoscopy, Care After Refer to this sheet in the next few weeks. These instructions provide you with information about caring for yourself after your procedure. Your health care provider may also give you more specific instructions. Your treatment has been planned according to current medical practices, but problems sometimes occur. Call your health care provider if you have any problems or questions after your procedure. What can I expect after the procedure? After the procedure, it is common to have:  A sore throat.  Nausea.  Bloating.  Dizziness.  Fatigue. Follow these instructions at home:  Do not eat or drink anything until the numbing medicine (local anesthetic) has worn off and your gag reflex has returned. You will know that the local anesthetic has worn off when you can swallow comfortably.  Do not drive for 24 hours if you received a medicine to help you relax (sedative).  If your health care provider took a tissue sample for testing during the procedure, make sure to get your test results. This is your responsibility. Ask your health care provider or the department performing the test when your results will be ready.  Keep all follow-up visits as told by your health care provider. This is important. Contact a health care provider if:  You cannot stop coughing.  You are not urinating.  You are urinating less than usual. Get help right away if:  You have trouble swallowing.  You cannot eat or drink.  You have throat or chest pain that gets worse.  You are dizzy or light-headed.  You faint.  You have nausea or vomiting.  You have chills.  You have a fever.  You have severe abdominal pain.  You have black, tarry, or bloody  stools. This information is not intended to replace advice given to you by your health care provider. Make sure you discuss any questions you have with your health care provider. Document Released: 04/26/2012 Document Revised: 10/16/2015 Document Reviewed: 04/03/2015 Elsevier Interactive Patient Education  2017 [employer].

## 2016-07-28 NOTE — H&P (Signed)
Lisa Sutton is an 83 y.o. female.   Chief Complaint: Patient's here for EGD possible ED. HPI: Patient is a year-old Caucasian female was chronic GERD who is heartburn is well controlled with therapy. She also has had her esophagus dilated in the past. She.presents with waking up with blood tinged sputum. She does not cough. She dysphagia particularly with rice. She was seen by ENT specialist and she was told sinuses were not the source of her bleeding. Patient was seen her office and chest film was obtained and it is within normal limits. She is therefore undergoing diagnostic EGD. She does not have shortness of breath or chest pain.   Past Medical History:  Diagnosis Date  . Anxiety   . Arthritis   . Cataract   . Dysphagia    'sometimes but not a major issue' been checked out by GI (per pt)  . Dysrhythmia    'heart used to skip but doesn't anymore' was checked out by Dr. Ron Parker late '90s, everything checked out ok and not had any skipping since (all per pt)  . Essential hypertension, benign   . Fatty liver   . GERD (gastroesophageal reflux disease)   . Hepatitis B    had at age 17, 'GI doc said it's gone away'  . Hepatitis B surface antigen positive   . History of hiatal hernia   . Hypercalcemia   . Hypothyroidism   . Kidney stone August 2014   Patient was seen at [facility]  . Obesity   . Osteoporosis   . PONV (postoperative nausea and vomiting)   . Type 2 diabetes mellitus ([place])     Past Surgical History:  Procedure Laterality Date  . ABDOMINAL HYSTERECTOMY    . BILATERAL CARPAL TUNNEL RELEASE     Dr. Marlou Sa at surgical center  . CATARACT EXTRACTION, BILATERAL    . Deviated septum repair    . EYE SURGERY    . KNEE ARTHROPLASTY Left 09/15/2015   Procedure: COMPUTER ASSISTED TOTAL KNEE ARTHROPLASTY;  Surgeon: Marybelle Killings, MD;  Location: [facility];  Service: Orthopedics;  Laterality: Left;  . KNEE ARTHROSCOPY     left knee 2003    Family History  Problem Relation Age of Onset   . Hypertension Father   . Hip fracture Father   . Cancer Maternal Aunt   . Diabetes Maternal Aunt   . Diabetes Maternal Uncle    Social History:  reports that she has never smoked. She has never used smokeless tobacco. She reports that she does not drink alcohol or use drugs.  Allergies:  Allergies  Allergen Reactions  . Statins Other (See Comments)    'discomfort, aching everywhere'  . Zetia [Ezetimibe] Other (See Comments)    Leg pain  . Livalo [Pitavastatin] Other (See Comments)    Leg pain  . Zocor [Simvastatin] Other (See Comments)    Leg pain    Medications Prior to Admission  Medication Sig Dispense Refill  . ALPRAZolam (XANAX) 0.25 MG tablet Take 1 tablet (0.25 mg total) by mouth 2 (two) times daily as needed for anxiety. (Patient taking differently: Take 0.25 mg by mouth at bedtime as needed for anxiety or sleep. ) 180 tablet 0  . amLODipine (NORVASC) 2.5 MG tablet TAKE 1 TABLET BY MOUTH  DAILY (Patient taking differently: Take 2.5mg s by mouth every evening) 90 tablet 2  . aspirin 81 MG chewable tablet Chew 81 mg by mouth every evening.     Marland Kitchen atenolol (TENORMIN) 50  MG tablet TAKE 1 TABLET BY MOUTH  DAILY 90 tablet 1  . CINNAMON PO Take 2,000 mg by mouth daily.    Marland Kitchen levothyroxine (SYNTHROID, LEVOTHROID) 112 MCG tablet Take 1 tablet by mouth  daily 90 tablet 1  . levothyroxine (SYNTHROID, LEVOTHROID) 25 MCG tablet TAKE 1 TABLET BY MOUTH  DAILY BEFORE BREAKFAST (Patient taking differently: Take 73mcg on Mondays,Wednesday and Fridays with the 112mg  levothyroxine) 90 tablet 2  . lisinopril (PRINIVIL,ZESTRIL) 20 MG tablet TAKE 1 TABLET BY MOUTH  DAILY 90 tablet 1  . Magnesium 500 MG TABS Take 500 mg by mouth daily.    . metFORMIN (GLUCOPHAGE) 500 MG tablet Take 1 tablet by mouth two  times daily with meals (Patient taking differently: Take 500 mg by mouth 2 (two) times daily. ) 180 tablet 1  . Multiple Vitamin (MULTIVITAMIN WITH MINERALS) TABS tablet Take 1 tablet by mouth  daily.    . naproxen sodium (ANAPROX) 220 MG tablet Take 220 mg by mouth daily as needed (pain).    Earney Navy Bicarbonate (ZEGERID) 20-1100 MG CAPS capsule Take 1 capsule by mouth every Monday, Wednesday, and Friday. (Patient taking differently: Take 1 capsule by mouth daily. ) 28 each   . vitamin C (ASCORBIC ACID) 500 MG tablet Take 1,000 mg by mouth daily.    Marland Kitchen ACCU-CHEK AVIVA PLUS test strip Test blood sugar two times  daily 200 each 1  . CRESTOR 10 MG tablet TAKE 1 TABLET BY MOUTH  DAILY AS DIRECTED (Patient not taking: Reported on 07/23/2016) 90 tablet 1  . furosemide (LASIX) 20 MG tablet TAKE 2 TABLETS BY MOUTH  DAILY (Patient taking differently: Take 20 mg by mouth every morning) 180 tablet 1  . omeprazole (PRILOSEC) 20 MG capsule TAKE 1 CAPSULE BY MOUTH  DAILY (Patient not taking: Reported on 07/23/2016) 90 capsule 1    Results for orders placed or performed during the hospital encounter of 07/28/16 (from the past 48 hour(s))  Glucose, capillary     Status: Abnormal   Collection Time: 07/28/16 12:12 PM  Result Value Ref Range   Glucose-Capillary 103 (H) 65 - 99 mg/dL   No results found.  ROS  Blood pressure (!) 159/95, pulse 63, temperature 97.7 F (36.5 C), temperature source Oral, resp. rate 14, height 5\' 3"  (1.6 m), weight 167 lb (75.8 kg), SpO2 97 %. Physical Exam  Constitutional: She appears well-developed and well-nourished.  HENT:  Mouth/Throat: Oropharynx is clear and moist.  Eyes: Conjunctivae are normal. No scleral icterus.  Neck: No thyromegaly present.  Cardiovascular: Normal rate, regular rhythm and normal heart sounds.   No murmur heard. Respiratory: Effort normal and breath sounds normal.  GI: Soft. She exhibits no distension and no mass. There is no tenderness.  Musculoskeletal: She exhibits no edema.  Lymphadenopathy:    She has no cervical adenopathy.  Neurological: She is alert.  Skin: Skin is warm and dry.     Assessment/Plan ?  Hematemesis. Chronic GERD. Dysphagia with certain foods only. EGD and possible ED.  [name], MD 07/28/2016, 12:31 PM

## 2016-08-02 ENCOUNTER — Encounter (HOSPITAL_COMMUNITY): Payer: Self-pay | Admitting: Internal Medicine

## 2016-08-03 ENCOUNTER — Other Ambulatory Visit (INDEPENDENT_AMBULATORY_CARE_PROVIDER_SITE_OTHER): Payer: Medicare Other

## 2016-08-03 DIAGNOSIS — I10 Essential (primary) hypertension: Secondary | ICD-10-CM | POA: Diagnosis not present

## 2016-08-03 DIAGNOSIS — N183 Chronic kidney disease, stage 3 (moderate): Secondary | ICD-10-CM | POA: Diagnosis not present

## 2016-08-03 DIAGNOSIS — Z79899 Other long term (current) drug therapy: Secondary | ICD-10-CM | POA: Diagnosis not present

## 2016-08-09 ENCOUNTER — Other Ambulatory Visit: Payer: Self-pay | Admitting: Family Medicine

## 2016-08-09 DIAGNOSIS — R591 Generalized enlarged lymph nodes: Secondary | ICD-10-CM

## 2016-08-11 ENCOUNTER — Telehealth: Payer: Self-pay | Admitting: Family Medicine

## 2016-08-11 DIAGNOSIS — R042 Hemoptysis: Secondary | ICD-10-CM

## 2016-08-11 NOTE — Telephone Encounter (Signed)
Pt called and states that she is STILL having some "spitting up of blood first thing every morning" It's a few drops and then no more until the next morning. She went to ENT - [facility] and he said sinus all looked good - January. She went to GI - done endoscopy and abd [place] and all WNL. Also had CXR - WNL  Some of the Dr's  She seen mentioned Pulmonary or Neurology She wants your opinion and a referral to whom you wish.  We had done a referral to neurology for HA's a few months back - but she did not go - she wonders now if she should??  Please advise and il will call pt back.

## 2016-08-11 NOTE — Telephone Encounter (Signed)
Called pt - referral placed

## 2016-08-11 NOTE — Telephone Encounter (Signed)
Please refer this patient to pulmonology for further evaluation

## 2016-08-31 ENCOUNTER — Other Ambulatory Visit: Payer: Self-pay | Admitting: *Deleted

## 2016-08-31 ENCOUNTER — Encounter: Payer: Self-pay | Admitting: Family Medicine

## 2016-08-31 DIAGNOSIS — Z0000 Encounter for general adult medical examination without abnormal findings: Secondary | ICD-10-CM

## 2016-08-31 DIAGNOSIS — E034 Atrophy of thyroid (acquired): Secondary | ICD-10-CM

## 2016-08-31 DIAGNOSIS — E785 Hyperlipidemia, unspecified: Secondary | ICD-10-CM

## 2016-08-31 DIAGNOSIS — K219 Gastro-esophageal reflux disease without esophagitis: Secondary | ICD-10-CM

## 2016-08-31 DIAGNOSIS — E559 Vitamin D deficiency, unspecified: Secondary | ICD-10-CM

## 2016-08-31 DIAGNOSIS — I10 Essential (primary) hypertension: Secondary | ICD-10-CM

## 2016-09-03 ENCOUNTER — Other Ambulatory Visit: Payer: Medicare Other

## 2016-09-03 DIAGNOSIS — K219 Gastro-esophageal reflux disease without esophagitis: Secondary | ICD-10-CM | POA: Diagnosis not present

## 2016-09-03 DIAGNOSIS — R809 Proteinuria, unspecified: Secondary | ICD-10-CM | POA: Diagnosis not present

## 2016-09-03 DIAGNOSIS — E034 Atrophy of thyroid (acquired): Secondary | ICD-10-CM

## 2016-09-03 DIAGNOSIS — E785 Hyperlipidemia, unspecified: Secondary | ICD-10-CM

## 2016-09-03 DIAGNOSIS — Z0000 Encounter for general adult medical examination without abnormal findings: Secondary | ICD-10-CM

## 2016-09-03 DIAGNOSIS — I10 Essential (primary) hypertension: Secondary | ICD-10-CM

## 2016-09-03 DIAGNOSIS — E559 Vitamin D deficiency, unspecified: Secondary | ICD-10-CM

## 2016-09-03 DIAGNOSIS — D509 Iron deficiency anemia, unspecified: Secondary | ICD-10-CM | POA: Diagnosis not present

## 2016-09-03 DIAGNOSIS — N183 Chronic kidney disease, stage 3 (moderate): Secondary | ICD-10-CM | POA: Diagnosis not present

## 2016-09-03 DIAGNOSIS — Z79899 Other long term (current) drug therapy: Secondary | ICD-10-CM | POA: Diagnosis not present

## 2016-09-03 LAB — BAYER DCA HB A1C WAIVED: HB A1C (BAYER DCA - WAIVED): 6.3 % (ref <7.0)

## 2016-09-04 LAB — CBC WITH DIFFERENTIAL/PLATELET
BASOS ABS: 0.1 10*3/uL (ref 0.0–0.2)
Basos: 1 %
EOS (ABSOLUTE): 0.2 10*3/uL (ref 0.0–0.4)
Eos: 3 %
HEMATOCRIT: 40.1 % (ref 34.0–46.6)
Hemoglobin: 13.3 g/dL (ref 11.1–15.9)
IMMATURE GRANULOCYTES: 2 %
Immature Grans (Abs): 0.1 10*3/uL (ref 0.0–0.1)
Lymphocytes Absolute: 2.2 10*3/uL (ref 0.7–3.1)
Lymphs: 31 %
MCH: 29.4 pg (ref 26.6–33.0)
MCHC: 33.2 g/dL (ref 31.5–35.7)
MCV: 89 fL (ref 79–97)
Monocytes Absolute: 0.6 10*3/uL (ref 0.1–0.9)
Monocytes: 9 %
NEUTROS PCT: 54 %
Neutrophils Absolute: 4 10*3/uL (ref 1.4–7.0)
Platelets: 226 10*3/uL (ref 150–379)
RBC: 4.53 x10E6/uL (ref 3.77–5.28)
RDW: 14.5 % (ref 12.3–15.4)
WBC: 7.2 10*3/uL (ref 3.4–10.8)

## 2016-09-04 LAB — BMP8+EGFR
BUN/Creatinine Ratio: 22 (ref 12–28)
BUN: 26 mg/dL (ref 8–27)
CALCIUM: 9.5 mg/dL (ref 8.7–10.3)
CO2: 23 mmol/L (ref 18–29)
Chloride: 99 mmol/L (ref 96–106)
Creatinine, Ser: 1.19 mg/dL — ABNORMAL HIGH (ref 0.57–1.00)
GFR calc Af Amer: 50 mL/min/{1.73_m2} — ABNORMAL LOW (ref >59)
GFR, EST NON AFRICAN AMERICAN: 43 mL/min/{1.73_m2} — AB (ref >59)
GLUCOSE: 128 mg/dL — AB (ref 65–99)
POTASSIUM: 5 mmol/L (ref 3.5–5.2)
SODIUM: 139 mmol/L (ref 134–144)

## 2016-09-04 LAB — HEPATIC FUNCTION PANEL
ALBUMIN: 4 g/dL (ref 3.5–4.7)
ALT: 12 IU/L (ref 0–32)
AST: 17 IU/L (ref 0–40)
Alkaline Phosphatase: 74 IU/L (ref 39–117)
Bilirubin, Direct: 0.07 mg/dL (ref 0.00–0.40)
TOTAL PROTEIN: 6.7 g/dL (ref 6.0–8.5)

## 2016-09-04 LAB — THYROID PANEL WITH TSH
FREE THYROXINE INDEX: 3.3 (ref 1.2–4.9)
T3 UPTAKE RATIO: 33 % (ref 24–39)
T4, Total: 10.1 ug/dL (ref 4.5–12.0)
TSH: 0.906 u[IU]/mL (ref 0.450–4.500)

## 2016-09-04 LAB — LIPID PANEL
CHOL/HDL RATIO: 7 ratio — AB (ref 0.0–4.4)
Cholesterol, Total: 286 mg/dL — ABNORMAL HIGH (ref 100–199)
HDL: 41 mg/dL (ref >39)
Triglycerides: 499 mg/dL — ABNORMAL HIGH (ref 0–149)

## 2016-09-04 LAB — VITAMIN D 25 HYDROXY (VIT D DEFICIENCY, FRACTURES): Vit D, 25-Hydroxy: 32.9 ng/mL (ref 30.0–100.0)

## 2016-09-09 ENCOUNTER — Encounter: Payer: Self-pay | Admitting: Family Medicine

## 2016-09-09 ENCOUNTER — Ambulatory Visit (INDEPENDENT_AMBULATORY_CARE_PROVIDER_SITE_OTHER): Payer: Medicare Other | Admitting: Family Medicine

## 2016-09-09 VITALS — BP 127/76 | HR 66 | Temp 97.1°F | Ht 63.0 in | Wt 167.0 lb

## 2016-09-09 DIAGNOSIS — K219 Gastro-esophageal reflux disease without esophagitis: Secondary | ICD-10-CM

## 2016-09-09 DIAGNOSIS — E119 Type 2 diabetes mellitus without complications: Secondary | ICD-10-CM | POA: Diagnosis not present

## 2016-09-09 DIAGNOSIS — I10 Essential (primary) hypertension: Secondary | ICD-10-CM | POA: Diagnosis not present

## 2016-09-09 DIAGNOSIS — R042 Hemoptysis: Secondary | ICD-10-CM

## 2016-09-09 DIAGNOSIS — E559 Vitamin D deficiency, unspecified: Secondary | ICD-10-CM

## 2016-09-09 DIAGNOSIS — E039 Hypothyroidism, unspecified: Secondary | ICD-10-CM | POA: Diagnosis not present

## 2016-09-09 DIAGNOSIS — E785 Hyperlipidemia, unspecified: Secondary | ICD-10-CM

## 2016-09-09 NOTE — Patient Instructions (Addendum)
Medicare Annual Wellness Visit  [facility] and the medical providers at [facility] strive to bring you the best medical care.  In doing so we not only want to address your current medical conditions and concerns but also to detect new conditions early and prevent illness, disease and health-related problems.    Medicare offers a yearly Wellness Visit which allows our clinical staff to assess your need for preventative services including immunizations, lifestyle education, counseling to decrease risk of preventable diseases and screening for fall risk and other medical concerns.    This visit is provided free of charge (no copay) for all Medicare recipients. The clinical pharmacists at [facility] have begun to conduct these Wellness Visits which will also include a thorough review of all your medications.    As you primary medical provider recommend that you make an appointment for your Annual Wellness Visit if you have not done so already this year.  You may set up this appointment before you leave today or you may call back ([phone]) and schedule an appointment.  Please make sure when you call that you mention that you are scheduling your Annual Wellness Visit with the clinical pharmacist so that the appointment may be made for the proper length of time.     Continue current medications. Continue good therapeutic lifestyle changes which include good diet and exercise. Fall precautions discussed with patient. If an FOBT was given today- please return it to our front desk. If you are over 89 years old - you may need Prevnar 66 or the adult Pneumonia vaccine.  **Flu shots are available--- please call and schedule a FLU-CLINIC appointment**  After your visit with [place] today you will receive a survey in the mail or online from [employer] regarding your care with [place]. Please take a moment to fill this out. Your feedback is very  important to [place] as you can help [place] better understand your patient needs as well as improve your experience and satisfaction. WE CARE ABOUT YOU!!!   She should purchase a probiotic like align and take one daily to see if this will help with her bowel habits. She says her stools have been looser than usual. She will also need to get some vitamin D3, the now brand which can be purchased at the drugstore and [facility] or the pharmacy and [facility]. When you return for the wellness visit, the clinical pharmacist will discuss other options for better cholesterol control.

## 2016-09-09 NOTE — Progress Notes (Signed)
Subjective:    Patient ID: Lisa Sutton, female    DOB: 17-Aug-1933, 83 y.o.   MRN: [id number]  HPI Pt here for follow up and management of chronic medical problems which includes diabetes, hyperlipidemia, and hypertension. She is taking medication regularly.This patient has a history of hypercalcemia in the past. She is complaining with spitting up some blood every morning since January. She is not sleeping well and she says that her feet and legs hurt and burn. She also describes some hot flashes. The patient describes the spitting up blood as occurring every morning since January but this is actually improved somewhat recently. She is seeing an ear nose and throat specialist and the gastro-and neurologist with an endoscopy and no findings were reported. She does complain of trouble sleeping at night because her feet and legs hurt and burn. She also complains of some hot flashes and has been off of hormones for several years. This far as the bleeding in her throat that she coughs up in the morning she did see the ear nose and throat specialist and the visit with him was negative. She has an upcoming appointment with the pulmonologist to further evaluate this blood in her throat in the morning. She denies any chest pain or shortness of breath anymore than usual. Other than some loose bowel movements are no complaints with her intestinal system. She is passing her water without problems.    Patient Active Problem List   Diagnosis Date Noted  . Hemoptysis 07/16/2016  . Acute pain of right knee 06/02/2016  . Hypothyroidism due to acquired atrophy of thyroid 04/26/2016  . Vitamin D deficiency 04/26/2016  . Status post total left knee replacement 09/15/2015  . Statin intolerance 09/11/2015  . Peripheral neuropathy 07/30/2015  . Abnormal chest x-ray 11/27/2014  . Protein-calorie malnutrition, severe ([place]) 11/27/2014  . AKI (acute kidney injury) ([place]) 11/26/2014  . Hypercalcemia 11/26/2014  .  Leukocytosis 11/26/2014  . Weight loss 11/26/2014  . Weakness 11/26/2014  . Insomnia 04/12/2013  . Hyperlipidemia 04/12/2013  . Essential hypertension, benign 10/13/2012  . GERD (gastroesophageal reflux disease) 04/25/2012  . Fatty liver 08/09/2011  . Hepatitis B 08/09/2011  . Dysphagia 08/09/2011  . Type 2 diabetes mellitus ([place]) 08/09/2011  . Hypothyroid 08/09/2011   Outpatient Encounter Prescriptions as of 09/09/2016  Medication Sig  . ACCU-CHEK AVIVA PLUS test strip Test blood sugar two times  daily  . ALPRAZolam (XANAX) 0.25 MG tablet Take 1 tablet (0.25 mg total) by mouth 2 (two) times daily as needed for anxiety. (Patient taking differently: Take 0.25 mg by mouth at bedtime as needed for anxiety or sleep. )  . amLODipine (NORVASC) 2.5 MG tablet TAKE 1 TABLET BY MOUTH  DAILY (Patient taking differently: Take 2.5mg s by mouth every evening)  . aspirin 81 MG chewable tablet Chew 81 mg by mouth every evening.   Marland Kitchen atenolol (TENORMIN) 50 MG tablet TAKE 1 TABLET BY MOUTH  DAILY  . CINNAMON PO Take 2,000 mg by mouth daily.  . furosemide (LASIX) 20 MG tablet TAKE 2 TABLETS BY MOUTH  DAILY (Patient taking differently: Take 20 mg by mouth every morning)  . levothyroxine (SYNTHROID, LEVOTHROID) 112 MCG tablet TAKE 1 TABLET BY MOUTH  DAILY  . levothyroxine (SYNTHROID, LEVOTHROID) 25 MCG tablet TAKE 1 TABLET BY MOUTH  DAILY BEFORE BREAKFAST (Patient taking differently: Take 42mcg on Mondays,Wednesday and Fridays with the 112mg  levothyroxine)  . lisinopril (PRINIVIL,ZESTRIL) 20 MG tablet TAKE 1 TABLET BY MOUTH  DAILY  .  Magnesium 500 MG TABS Take 500 mg by mouth daily.  . metFORMIN (GLUCOPHAGE) 500 MG tablet Take 1 tablet by mouth two  times daily with meals (Patient taking differently: Take 500 mg by mouth 2 (two) times daily. )  . Multiple Vitamin (MULTIVITAMIN WITH MINERALS) TABS tablet Take 1 tablet by mouth daily.  . naproxen sodium (ANAPROX) 220 MG tablet Take 220 mg by mouth daily as needed  (pain).  . pantoprazole (PROTONIX) 40 MG tablet Take 1 tablet (40 mg total) by mouth 2 (two) times daily before a meal.  . vitamin C (ASCORBIC ACID) 500 MG tablet Take 1,000 mg by mouth daily.   No facility-administered encounter medications on file as of 09/09/2016.       Review of Systems  Constitutional: Negative.        Not sleeping well   HENT: Negative.        Recently spitting up blood- seen GI, ENT and is going to PULM next week  Eyes: Negative.   Respiratory: Negative.   Cardiovascular: Negative.   Gastrointestinal: Negative.   Endocrine: Negative.        "hot flashes"  Genitourinary: Negative.   Musculoskeletal: Negative.   Skin: Negative.   Allergic/Immunologic: Negative.   Neurological: Negative.        Feet and legs BURN and hurt  Hematological: Negative.   Psychiatric/Behavioral: Negative.        Objective:   Physical Exam  Constitutional: She is oriented to person, place, and time. She appears well-developed and well-nourished. No distress.  The patient is alert and pleasant. She is concerned about the blood that is appearing in her throat in the mornings.  HENT:  Head: Normocephalic and atraumatic.  Right Ear: External ear normal.  Left Ear: External ear normal.  Nose: Nose normal.  Mouth/Throat: Oropharynx is clear and moist. No oropharyngeal exudate.  Eyes: Conjunctivae and EOM are normal. Pupils are equal, round, and reactive to light. Right eye exhibits no discharge. Left eye exhibits no discharge. No scleral icterus.  Neck: Normal range of motion. Neck supple. No thyromegaly present.  No bruits thyromegaly or anterior cervical adenopathy  Cardiovascular: Normal rate, regular rhythm, normal heart sounds and intact distal pulses.   No murmur heard. The heart is regular at 60/m  Pulmonary/Chest: Effort normal and breath sounds normal. No respiratory distress. She has no wheezes. She has no rales.  Clear anteriorly and posteriorly   Abdominal: Soft.  Bowel sounds are normal. She exhibits no mass. There is no tenderness. There is no rebound and no guarding.  No abdominal tenderness masses or organ enlargement or bruits  Musculoskeletal: Normal range of motion. She exhibits no edema.  Lymphadenopathy:    She has no cervical adenopathy.  Neurological: She is alert and oriented to person, place, and time. She has normal reflexes. No cranial nerve deficit.  Skin: Skin is warm and dry. No rash noted.  Psychiatric: She has a normal mood and affect. Her behavior is normal. Judgment and thought content normal.  Nursing note and vitals reviewed.  BP 127/76 (BP Location: Left Arm)   Pulse 66   Temp 97.1 F (36.2 C) (Oral)   Ht 5\' 3"  (1.6 m)   Wt 167 lb (75.8 kg)   BMI 29.58 kg/m         Assessment & Plan:  1. Hyperlipidemia, unspecified hyperlipidemia type -The patient meets with the clinical pharmacists and has her Medicare wellness visit, she will discuss with her her intolerance to statins  and where we should go with this elevated triglyceride result.  2. Essential hypertension, benign -The blood pressure is good today and she will continue with current treatment  3. Hypothyroidism, unspecified type -All thyroid tests are within normal limits and she will continue with current treatment and we should probably check another thyroid profile at the next visit.  4. Type 2 diabetes mellitus without complication, without long-term current use of insulin (HCC) -The patient's blood sugars at home and been running in the 1:15 to 1:30 range fasting and her A1c indicates good blood sugar control in the prediabetic range. Some the symptoms with her feet may be secondary to diabetic perform neuropathy. - Microalbumin / creatinine urine ratio  5. Vitamin D deficiency -Restart vitamin D 3, the now brand at 1000 units daily  6. Gastroesophageal reflux disease without esophagitis -She has had a recent endoscopy without any findings on this report  and according to the patient.  7. Hemoptysis -She has an upcoming appointment with the pulmonologist to follow-up on the hemoptysis. She has seen the gastroenterologist the ear nose and throat specialist and there were no findings that were of significance from the specialists.  No orders of the defined types were placed in this encounter.  Patient Instructions                       Medicare Annual Wellness Visit  [facility] and the medical providers at [facility] strive to bring you the best medical care.  In doing so we not only want to address your current medical conditions and concerns but also to detect new conditions early and prevent illness, disease and health-related problems.    Medicare offers a yearly Wellness Visit which allows our clinical staff to assess your need for preventative services including immunizations, lifestyle education, counseling to decrease risk of preventable diseases and screening for fall risk and other medical concerns.    This visit is provided free of charge (no copay) for all Medicare recipients. The clinical pharmacists at [facility] have begun to conduct these Wellness Visits which will also include a thorough review of all your medications.    As you primary medical provider recommend that you make an appointment for your Annual Wellness Visit if you have not done so already this year.  You may set up this appointment before you leave today or you may call back ([phone]) and schedule an appointment.  Please make sure when you call that you mention that you are scheduling your Annual Wellness Visit with the clinical pharmacist so that the appointment may be made for the proper length of time.     Continue current medications. Continue good therapeutic lifestyle changes which include good diet and exercise. Fall precautions discussed with patient. If an FOBT was given today- please return it to our front  desk. If you are over 21 years old - you may need Prevnar 64 or the adult Pneumonia vaccine.  **Flu shots are available--- please call and schedule a FLU-CLINIC appointment**  After your visit with [place] today you will receive a survey in the mail or online from [employer] regarding your care with [place]. Please take a moment to fill this out. Your feedback is very important to [place] as you can help [place] better understand your patient needs as well as improve your experience and satisfaction. WE CARE ABOUT YOU!!!   She should purchase a probiotic like align and take one  daily to see if this will help with her bowel habits. She says her stools have been looser than usual. She will also need to get some vitamin D3, the now brand which can be purchased at the drugstore and [facility] or the pharmacy and [facility]. When you return for the wellness visit, the clinical pharmacist will discuss other options for better cholesterol control.   Arrie Senate MD

## 2016-09-10 ENCOUNTER — Telehealth: Payer: Self-pay | Admitting: Family Medicine

## 2016-09-10 LAB — MICROALBUMIN / CREATININE URINE RATIO
Creatinine, Urine: 125.6 mg/dL
Microalb/Creat Ratio: 38.8 mg/g creat — ABNORMAL HIGH (ref 0.0–30.0)
Microalbumin, Urine: 48.7 ug/mL

## 2016-09-10 NOTE — Telephone Encounter (Signed)
Patient aware of results.

## 2016-09-14 ENCOUNTER — Ambulatory Visit (INDEPENDENT_AMBULATORY_CARE_PROVIDER_SITE_OTHER): Payer: Medicare Other | Admitting: Internal Medicine

## 2016-09-14 ENCOUNTER — Other Ambulatory Visit: Payer: Self-pay | Admitting: Family Medicine

## 2016-09-14 ENCOUNTER — Encounter: Payer: Self-pay | Admitting: Internal Medicine

## 2016-09-14 ENCOUNTER — Ambulatory Visit (INDEPENDENT_AMBULATORY_CARE_PROVIDER_SITE_OTHER)
Admission: RE | Admit: 2016-09-14 | Discharge: 2016-09-14 | Disposition: A | Discharge status: Home / Self Care | Payer: Medicare Other | Source: Ambulatory Visit | Attending: Internal Medicine | Admitting: Internal Medicine

## 2016-09-14 VITALS — BP 142/80 | HR 67 | Ht 63.0 in | Wt 167.0 lb

## 2016-09-14 DIAGNOSIS — I10 Essential (primary) hypertension: Secondary | ICD-10-CM

## 2016-09-14 DIAGNOSIS — R058 Other specified cough: Secondary | ICD-10-CM | POA: Insufficient documentation

## 2016-09-14 DIAGNOSIS — R05 Cough: Secondary | ICD-10-CM | POA: Diagnosis not present

## 2016-09-14 MED ORDER — IRBESARTAN 150 MG PO TABS: 150.0000 mg | ORAL_TABLET | Freq: Every day | ORAL | 11 refills | Status: DC

## 2016-09-14 NOTE — Progress Notes (Signed)
Subjective:     Patient ID: Lisa Sutton, female   DOB: [birth year],     MRN: [id number]  HPI  58 yowf  never smoker with new onset am bloody mucus since Jan 2018 assoc daily watery rhinitis and mostly right sided nasal congestion referred to pulmonary clinic 09/14/2016 by Dr   Lisa Sutton p neg GI/ENT evals     09/14/2016 1st [facility] office visit/ Lisa Sutton   Chief Complaint  Patient presents with  . Pulmonary Consult    Referred by Dr. Redge Sutton. Pt states started spitting up blood in the am- started in Jan 2018.  She states she has not spit up any in the past wk. She states that she does not cough at all.    for the past week prior to OV  She has not noted any more  blood but continues with urge to clear her throat mostly in AM's on ACEi chronically and no better on GERD rx trials    No obvious day to day or daytime variability or assoc sob  or cp or chest tightness, subjective wheeze or overt sinus or hb symptoms. No unusual exp hx or h/o childhood pna/ asthma or knowledge of premature birth.  Sleeping ok without nocturnal  or early am exacerbation  of respiratory  c/o's or need for noct saba. Also denies any obvious fluctuation of symptoms with weather or environmental changes or other aggravating or alleviating factors except as outlined above   Current Medications, Allergies, Complete Past Medical History, Past Surgical History, Family History, and Social History were reviewed in [employer] record.  ROS  The following are not active complaints unless bolded sore throat, dysphagia, dental problems, itching, sneezing,  nasal congestion or excess/ purulent secretions, ear ache,   fever, chills, sweats, unintended wt loss, classically pleuritic or exertional cp,  orthopnea pnd or leg swelling, presyncope, palpitations, abdominal pain, anorexia, nausea, vomiting, diarrhea  or change in bowel or bladder habits, change in stools or urine, dysuria,hematuria,   rash, arthralgias, visual complaints, headache, numbness, weakness or ataxia or problems with walking or coordination,  change in mood/affect or memory.          Review of Systems     Objective:   Physical Exam    amb pleasant wf nad   Wt Readings from Last 3 Encounters:  09/14/16 167 lb (75.8 kg)  09/09/16 167 lb (75.8 kg)  07/28/16 167 lb (75.8 kg)    Vital signs reviewed - - Note on arrival 02 sats  99% on RA and bp 142/80     HEENT: nl dentition, turbinates bilaterally, and oropharynx. Nl external ear canals without cough reflex   NECK :  without JVD/Nodes/TM/ nl carotid upstrokes bilaterally   LUNGS: no acc muscle use,  Nl contour chest which is clear to A and P bilaterally without cough on insp or exp maneuvers   CV:  RRR  no s3 or murmur or increase in P2, and no edema   ABD:  soft and nontender with nl inspiratory excursion in the supine position. No bruits or organomegaly appreciated, bowel sounds nl  MS:  Nl gait/ ext warm without deformities, calf tenderness, cyanosis or clubbing No obvious joint restrictions   SKIN: warm and dry without lesions    NEURO:  alert, approp, nl sensorium with  no motor or cerebellar deficits apparent.      I personally reviewed images and agree with radiology impression as follows:  CXR:  07/13/16 No pneumonia or effusion.  Question bronchitis. Assessment:

## 2016-09-14 NOTE — Progress Notes (Signed)
Spoke with pt and notified of results per Dr. Wert. Pt verbalized understanding and denied any questions. 

## 2016-09-14 NOTE — Patient Instructions (Addendum)
For nasal  drainage / throat tickle try take CHLORPHENIRAMINE  4 mg - take one every 4 hours as needed - available over the counter- may cause drowsiness so start with just a bedtime dose or two and see how you tolerate it before trying in daytime     Stop prinivil and replace avapro 150 mg one daily ok to adjust up or down  Please see patient coordinator before you leave today  to schedule sinus CT  Please remember to go to the   x-ray department downstairs in the basement  for your tests - we will call you with the results when they are available.      If you are satisfied with your treatment plan,  let your doctor know and he/she can either refill your medications or you can return here when your prescription runs out.     If in any way you are not 100% satisfied,  please tell us.  If 100% better, tell your friends!  Pulmonary follow up is as needed

## 2016-09-15 ENCOUNTER — Telehealth: Payer: Self-pay | Admitting: Internal Medicine

## 2016-09-15 MED ORDER — ALPRAZOLAM 0.25 MG PO TABS: 0.2500 mg | ORAL_TABLET | Freq: Two times a day (BID) | ORAL | 1 refills | Status: DC | PRN

## 2016-09-15 NOTE — Telephone Encounter (Signed)
Spoke with pt, who states during her OV with MW on 09/14/16, she was instructed to stop prinivi and replaced with avapro 150mg . Pt is also taking atenolon 50mg  and norvasc 2.5mg . Pt is wanting to clarify that she should continue both the atenolon and norvasc?  MW please advise. Thanks.   09/14/16 For nasal  drainage / throat tickle try take CHLORPHENIRAMINE  4 mg - take one every 4 hours as needed - available over the counter- may cause drowsiness so start with just a bedtime dose or two and see how you tolerate it before trying in daytime     Stop prinivil and replace avapro 150 mg one daily ok to adjust up or down  Please see patient coordinator before you leave today  to schedule sinus CT  Please remember to go to the   x-ray department downstairs in the basement  for your tests - we will call you with the results when they are available.       If you are satisfied with your treatment plan,  let your doctor know and he/she can either refill your medications or you can return here when your prescription runs out.     If in any way you are not 100% satisfied,  please tell us.  If 100% better, tell your friends!  Pulmonary follow up is as needed

## 2016-09-15 NOTE — Assessment & Plan Note (Addendum)
Sinus CT 09/14/2016 >>>   Upper airway cough syndrome (previously labeled PNDS) , is  so named because it's frequently impossible to sort out how much is  CR/sinusitis with freq throat clearing (which can be related to primary GERD)   vs  causing  secondary (" extra esophageal")  GERD from wide swings in gastric pressure that occur with throat clearing, often  promoting self use of mint and menthol lozenges that reduce the lower esophageal sphincter tone and exacerbate the problem further in a cyclical fashion.   These are the same pts (now being labeled as having "irritable larynx syndrome" by some cough centers) who not infrequently have a history of having failed to tolerate ace inhibitors,  dry powder inhalers or biphosphonates or report having atypical/extraesophageal reflux symptoms that don't respond to standard doses of PPI  and are easily confused as having aecopd or asthma flares by even experienced allergists/ pulmonologists (myself included).    First check sinus CT and try 1st gen H1 and  off acei and see if throat clearing and "hemoptysis" - which is very unlikely from pulmonary source in this never smoker with am bloody spit only - resolves  Total time devoted to counseling  > 50 % of initial 60 min office visit:  review case with pt/ discussion of options/alternatives/ personally creating written customized instructions  in presence of pt  then going over those specific  Instructions directly with the pt including how to use all of the meds but in particular covering each new medication in detail and the difference between the maintenance= "automatic" meds and the prns using an action plan format for the latter (If this problem/symptom => do that organization reading Left to right).  Please see AVS from this visit for a full list of these instructions which I personally wrote for this pt and  are unique to this visit.

## 2016-09-15 NOTE — Telephone Encounter (Signed)
Spoke with the pt and notified of recs per MW  She verbalized understanding  Nothing further needed 

## 2016-09-15 NOTE — Telephone Encounter (Signed)
Yes the instructions are correct :  Stop prinivl and substitute it with avapro, this is the only change in bp meds

## 2016-09-15 NOTE — Assessment & Plan Note (Signed)
Trial off acei due to throat clearing 09/14/2016   In the best review of chronic cough to date ( NEJM 2016 375 [id number]) ,  ACEi are now felt to cause cough in up to  20% of pts which is a 4 fold increase from previous reports and does not include the variety of non-specific complaints we see in pulmonary clinic in pts on ACEi but previously attributed to another dx like  Copd/asthma and  include PNDS, throat and chest congestion, "bronchitis", unexplained dyspnea and noct "strangling" sensations, and hoarseness, but also  atypical /refractory GERD symptoms like dysphagia and "bad heartburn"   The only way I know  to prove this is not an "ACEi Case" is a trial off ACEi x a minimum of 6 weeks then regroup.   Try avapro 150 mg daily and return here if not 100% better,  f/u Dr Laurance Flatten otherwise

## 2016-09-22 DIAGNOSIS — N183 Chronic kidney disease, stage 3 (moderate): Secondary | ICD-10-CM | POA: Diagnosis not present

## 2016-09-22 DIAGNOSIS — I10 Essential (primary) hypertension: Secondary | ICD-10-CM | POA: Diagnosis not present

## 2016-09-22 DIAGNOSIS — R809 Proteinuria, unspecified: Secondary | ICD-10-CM | POA: Diagnosis not present

## 2016-09-22 DIAGNOSIS — E1129 Type 2 diabetes mellitus with other diabetic kidney complication: Secondary | ICD-10-CM | POA: Diagnosis not present

## 2016-09-24 ENCOUNTER — Ambulatory Visit (HOSPITAL_COMMUNITY)
Admission: RE | Admit: 2016-09-24 | Discharge: 2016-09-24 | Disposition: A | Discharge status: Home / Self Care | Payer: Medicare Other | Source: Ambulatory Visit | Attending: Internal Medicine | Admitting: Internal Medicine

## 2016-09-24 DIAGNOSIS — J349 Unspecified disorder of nose and nasal sinuses: Secondary | ICD-10-CM | POA: Diagnosis not present

## 2016-09-24 DIAGNOSIS — J3489 Other specified disorders of nose and nasal sinuses: Secondary | ICD-10-CM | POA: Diagnosis not present

## 2016-09-24 DIAGNOSIS — R05 Cough: Secondary | ICD-10-CM | POA: Insufficient documentation

## 2016-09-24 DIAGNOSIS — R058 Other specified cough: Secondary | ICD-10-CM

## 2016-09-27 ENCOUNTER — Telehealth: Payer: Self-pay | Admitting: Family Medicine

## 2016-09-27 ENCOUNTER — Ambulatory Visit (INDEPENDENT_AMBULATORY_CARE_PROVIDER_SITE_OTHER): Payer: Medicare Other | Admitting: Pharmacist

## 2016-09-27 ENCOUNTER — Encounter: Payer: Self-pay | Admitting: Pharmacist

## 2016-09-27 VITALS — BP 130/80 | HR 64 | Ht 63.0 in | Wt 167.5 lb

## 2016-09-27 DIAGNOSIS — E781 Pure hyperglyceridemia: Secondary | ICD-10-CM

## 2016-09-27 DIAGNOSIS — Z780 Asymptomatic menopausal state: Secondary | ICD-10-CM

## 2016-09-27 DIAGNOSIS — Z0000 Encounter for general adult medical examination without abnormal findings: Secondary | ICD-10-CM | POA: Diagnosis not present

## 2016-09-27 MED ORDER — PITAVASTATIN CALCIUM 4 MG PO TABS: 4.0000 mg | ORAL_TABLET | ORAL | 0 refills | Status: DC

## 2016-09-27 NOTE — Progress Notes (Signed)
Spoke with pt and notified of results per Dr. Wert. Pt verbalized understanding and denied any questions. 

## 2016-09-27 NOTE — Progress Notes (Signed)
Patient ID: JAMISEN HAWES, female   DOB: 04/27/1934, 83 y.o.   MRN: [id number]     Subjective:   TOCARRA GASSEN is a 83 y.o. female who presents for a subsequent Medicare Annual Wellness Visit.  Ms. Dormer is widowed for 81 years in October.  She lives in [place] , [place] and her only son lives with her.  She is retired from working for [employer] in [employer]    Her only health concern is that she will occasionally spit up blood in the morning.  She has seen ENT for this and her states there was nothing he could find causing in in sinuses or nasal passages.  She then saw GI who did endoscopy and did not find any problems.  Then she saw pulmonologist who did not note any pulmonary related problems but did  Switch lisinopril to irbesartan.  She has seen some improvement.   Current Medications (verified) Outpatient Encounter Prescriptions as of 09/27/2016  Medication Sig  . ACCU-CHEK AVIVA PLUS test strip Test blood sugar two times  daily  . ALPRAZolam (XANAX) 0.25 MG tablet Take 1 tablet (0.25 mg total) by mouth 2 (two) times daily as needed for anxiety.  Marland Kitchen amLODipine (NORVASC) 2.5 MG tablet TAKE 1 TABLET BY MOUTH  DAILY (Patient taking differently: Take 2.5mg s by mouth every evening)  . aspirin 81 MG chewable tablet Chew 81 mg by mouth every evening.   Marland Kitchen atenolol (TENORMIN) 50 MG tablet TAKE 1 TABLET BY MOUTH  DAILY  . Cholecalciferol (VITAMIN D3) 1000 units CAPS Take 1 capsule by mouth daily.  Marland Kitchen CINNAMON PO Take 2,000 mg by mouth daily.  . irbesartan (AVAPRO) 150 MG tablet Take 1 tablet (150 mg total) by mouth daily.  Marland Kitchen levothyroxine (SYNTHROID, LEVOTHROID) 112 MCG tablet TAKE 1 TABLET BY MOUTH  DAILY  . levothyroxine (SYNTHROID, LEVOTHROID) 25 MCG tablet TAKE 1 TABLET BY MOUTH  DAILY BEFORE BREAKFAST (Patient taking differently: Take 2mcg on Mondays,Wednesday and Fridays with the 112mg  levothyroxine)  . metFORMIN (GLUCOPHAGE) 500 MG tablet Take 1 tablet by mouth two  times daily  with meals  . Multiple Vitamin (MULTIVITAMIN WITH MINERALS) TABS tablet Take 1 tablet by mouth daily.  . pantoprazole (PROTONIX) 40 MG tablet Take 1 tablet (40 mg total) by mouth 2 (two) times daily before a meal.  . vitamin C (ASCORBIC ACID) 500 MG tablet Take 1,000 mg by mouth daily.  Marland Kitchen acetaminophen (TYLENOL) 325 MG tablet Take 650 mg by mouth every 6 (six) hours as needed.  . naproxen sodium (ANAPROX) 220 MG tablet Take 220 mg by mouth daily as needed (pain).   No facility-administered encounter medications on file as of 09/27/2016.     Allergies (verified) Statins; Zetia [ezetimibe]; and Zocor [simvastatin]   History: Past Medical History:  Diagnosis Date  . Anxiety   . Arthritis   . Cataract   . Dysphagia    'sometimes but not a major issue' been checked out by GI (per pt)  . Dysrhythmia    'heart used to skip but doesn't anymore' was checked out by Dr. Ron Parker late '90s, everything checked out ok and not had any skipping since (all per pt)  . Essential hypertension, benign   . Fatty liver   . GERD (gastroesophageal reflux disease)   . Hepatitis B    had at age 78, 'GI doc said it's gone away'  . Hepatitis B surface antigen positive   . History of hiatal hernia   .  Hypercalcemia   . Hypothyroidism   . Kidney stone August 2014   Patient was [facility] at [facility]  . Obesity   . Osteoporosis   . PONV (postoperative nausea and vomiting)   . Type 2 diabetes mellitus ([place])    Past Surgical History:  Procedure Laterality Date  . ABDOMINAL HYSTERECTOMY    . BILATERAL CARPAL TUNNEL RELEASE     Dr. Marlou Sa at surgical center  . CATARACT EXTRACTION, BILATERAL    . Deviated septum repair    . ESOPHAGOGASTRODUODENOSCOPY N/A 07/28/2016   Procedure: ESOPHAGOGASTRODUODENOSCOPY (EGD);  Surgeon: Rogene Houston, MD;  [facility];  Service: Endoscopy;  Laterality: N/A;  300 - moved to 3/7 @ 1:00  . EYE SURGERY    . KNEE ARTHROPLASTY Left 09/15/2015   Procedure: COMPUTER ASSISTED TOTAL  KNEE ARTHROPLASTY;  Surgeon: Marybelle Killings, MD;  Location: [facility];  Service: Orthopedics;  Laterality: Left;  . KNEE ARTHROSCOPY     left knee 2003   Family History  Problem Relation Age of Onset  . Hypertension Father   . Hip fracture Father   . COPD Mother   . Cancer Maternal Aunt   . Diabetes Maternal Aunt   . Diabetes Maternal Uncle    Social History   Occupational History  . Not on file.   Social History Main Topics  . Smoking status: Never Smoker  . Smokeless tobacco: Never Used  . Alcohol use No  . Drug use: No  . Sexual activity: No    Do you feel safe at home?  Yes Are there smokers in your home (other than you)? No  Dietary issues and exercise activities: Current Exercise Habits: The patient does not participate in regular exercise at present, Exercise limited by: None identified  Current Dietary habits:  Hot pocket croissant with ham and cheese; boiled egg with tomato.  Regular soda - Cola.   Objective:    Today's Vitals   09/27/16 1210  BP: 130/80  Pulse: 64  Weight: 167 lb 8 oz (76 kg)  Height: 5\' 3"  (1.6 m)  PainSc: 0-No pain   Body mass index is 29.67 kg/m.  Activities of Daily Living In your present state of health, do you have any difficulty performing the following activities: 09/27/2016  Hearing? Y  Vision? N  Difficulty concentrating or making decisions? N  Walking or climbing stairs? N  Dressing or bathing? N  Doing errands, shopping? N  Preparing Food and eating ? N  Using the Toilet? N  In the past six months, have you accidently leaked urine? N  Do you have problems with loss of bowel control? N  Managing your Medications? N  Managing your Finances? N  Housekeeping or managing your Housekeeping? N  Some recent data might be hidden     Cardiac Risk Factors include: advanced age (>61men, >39 women);diabetes mellitus;dyslipidemia;hypertension;microalbuminuria;sedentary lifestyle  Depression Screen PHQ 2/9 Scores 09/27/2016 09/09/2016  04/26/2016 12/17/2015  PHQ - 2 Score 0 0 0 0     Fall Risk Fall Risk  09/27/2016 09/09/2016 04/26/2016 12/17/2015 09/11/2015  Falls in the past year? No No No No No    Cognitive Function: MMSE - Mini Mental State Exam 09/27/2016 09/11/2015  Orientation to time 5 5  Orientation to Place 5 5  Registration 3 3  Attention/ Calculation 3 4  Recall 2 3  Language- name 2 objects 2 2  Language- repeat 1 1  Language- follow 3 step command 3 3  Language- read &  follow direction 1 1  Write a sentence 1 1  Copy design 0 0  Total score 26 28    Lipid Panel     Component Value Date/Time   CHOL 286 (H) 09/03/2016 0816   CHOL 147 04/16/2013 0820   TRIG 499 (H) 09/03/2016 0816   TRIG 368 (H) 04/21/2016 0812   TRIG 172 (H) 04/16/2013 0820   HDL 41 09/03/2016 0816   HDL 46 04/21/2016 0812   HDL 53 04/16/2013 0820   CHOLHDL 7.0 (H) 09/03/2016 0816   LDLCALC Comment 09/03/2016 0816   LDLCALC 60 04/16/2013 0820    Immunizations and Health Maintenance Immunization History  Administered Date(s) Administered  . Influenza Split 02/23/2016  . Influenza,inj,Quad PF,36+ Mos 04/12/2013, 02/20/2014, 04/30/2015  . Pneumococcal Conjugate-13 04/12/2013  . Pneumococcal Polysaccharide-23 07/22/2008   There are no preventive care reminders to display for this patient.  Patient Care Team: [name], MD as PCP - General (Family Medicine) Rogene Houston, MD as Consulting Physician (Gastroenterology) Marybelle Killings, MD as Consulting Physician (Orthopedic Surgery) Fran Lowes, MD as Consulting Physician (Nephrology) Lavonna Monarch, MD as Consulting Physician (Dermatology) Clent Jacks, MD as Consulting Physician (Ophthalmology) Philemon Kingdom, MD as Consulting Physician (Internal Medicine)  Indicate any recent Medical Services you may have received from other than [facility] providers in the past year (date may be approximate).    Assessment:    Annual Wellness Visit  Hypertriglyceridemia   uncontrolled    Screening Tests Health Maintenance  Topic Date Due  . COLONOSCOPY  03/11/2017 (Originally 03/03/2016)  . OPHTHALMOLOGY EXAM  10/14/2016  . DEXA SCAN  10/16/2016  . INFLUENZA VACCINE  12/22/2016  . HEMOGLOBIN A1C  03/05/2017  . FOOT EXAM  09/09/2017  . TETANUS/TDAP  02/21/2021  . PNA vac Low Risk Adult  Completed        Plan:   During the course of the visit [place] was educated and counseled about the following appropriate screening and preventive services:   Vaccines to include Pneumoccal, Influenza, Td and Shingles - checked price of new Shingrix - was $152.00 - patient declined due to price but will continue to recheck.  She has had Zostavax in past.   Colorectal cancer screening - sees Dr Laural Golden and will discuss when next colonoscopy is due   Cardiovascular disease screening - UTD  Start livalo 4mg  take 1 tablet every other day.  Recheck lipids in 6 weeks  Diabetes - due eye exam in next 1-2 month- patient reminded  Bone Denisty / Osteoporosis Screening - referral sent today  Mammogram - UTD  PAP - no longer required  Glaucoma screening / Diabetic Eye Exam - reminded that due in next 1-2 months  Nutrition counseling - discussed limiting soda, sugar and CHO intake  Advanced Directives - UTD, copy requested  Physical Activity - patient to initiate plan and do to gym program available at [facility]    Patient Instructions (the written plan) were given to the patient.   Cherre Robins, PharmD   09/27/2016

## 2016-09-27 NOTE — Patient Instructions (Addendum)
Lisa Sutton , Thank you for taking time to come for your Medicare Wellness Visit. I appreciate your ongoing commitment to your health goals. Please review the following plan we discussed and let me know if I can assist you in the future.   These are the goals we discussed: Try to limit sugar and carbohydrate intake - this will help with your blood glucose and triglycerides.   Start exercise with physical therapy as planned.    Increase non-starchy vegetables - carrots, green bean, squash, zucchini, tomatoes, onions, peppers, spinach and other green leafy vegetables, cabbage, lettuce, cucumbers, asparagus, okra (not fried), eggplant Limit processed foods (cakes, cookies, ice cream, crackers and chips) Increase fresh fruit but limit serving sizes 1/2 cup or about the size of tennis or baseball Limit red meat to no more than 1-2 times per week (serving size about the size of your palm) Choose whole grains / lean proteins - whole wheat bread, quinoa, whole grain rice (1/2 cup), fish, chicken, [place] Avoid sugar and calorie containing beverages - soda, sweet tea and juice.  Choose water or unsweetened tea instead. No colas.   I checked your insurance coverage for the new shingles vaccine - Shingrix - cost is $152.01   This is a list of the screening recommended for you and due dates:  Health Maintenance  Topic Date Due  . Colon Cancer Screening  2020 - it appears last was 2010 - check with Dr Laural Golden about when to repeat.  . Eye exam for diabetics  10/14/2016  . DEXA scan (bone density measurement)  10/16/2016 - ordered today - you should received a call to schedule within 1 week  . Flu Shot  12/22/2016  . Hemoglobin A1C  03/05/2017  . Complete foot exam   09/09/2017  . Tetanus Vaccine  02/21/2021  . Pneumonia vaccines  Completed  *Topic was postponed. The date shown is not the original due date.   Fall Prevention in the Home Falls can cause injuries and can affect people from all age  groups. There are many simple things that you can do to make your home safe and to help prevent falls. What can I do on the outside of my home?  Regularly repair the edges of walkways and driveways and fix any cracks.  Remove high doorway thresholds.  Trim any shrubbery on the main path into your home.  Use bright outdoor lighting.  Clear walkways of debris and clutter, including tools and rocks.  Regularly check that handrails are securely fastened and in good repair. Both sides of any steps should have handrails.  Install guardrails along the edges of any raised decks or porches.  Have leaves, snow, and ice cleared regularly.  Use sand or salt on walkways during winter months.  In the garage, clean up any spills right away, including grease or oil spills. What can I do in the bathroom?  Use night lights.  Install grab bars by the toilet and in the tub and shower. Do not use towel bars as grab bars.  Use non-skid mats or decals on the floor of the tub or shower.  If you need to sit down while you are in the shower, use a plastic, non-slip stool.  Keep the floor dry. Immediately clean up any water that spills on the floor.  Remove soap buildup in the tub or shower on a regular basis.  Attach bath mats securely with double-sided non-slip rug tape.  Remove throw rugs and other tripping hazards from  the floor. What can I do in the bedroom?  Use night lights.  Make sure that a bedside light is easy to reach.  Do not use oversized bedding that drapes onto the floor.  Have a firm chair that has side arms to use for getting dressed.  Remove throw rugs and other tripping hazards from the floor. What can I do in the kitchen?  Clean up any spills right away.  Avoid walking on wet floors.  Place frequently used items in easy-to-reach places.  If you need to reach for something above you, use a sturdy step stool that has a grab bar.  Keep electrical cables out of the  way.  Do not use floor polish or wax that makes floors slippery. If you have to use wax, make sure that it is non-skid floor wax.  Remove throw rugs and other tripping hazards from the floor. What can I do in the stairways?  Do not leave any items on the stairs.  Make sure that there are handrails on both sides of the stairs. Fix handrails that are broken or loose. Make sure that handrails are as long as the stairways.  Check any carpeting to make sure that it is firmly attached to the stairs. Fix any carpet that is loose or worn.  Avoid having throw rugs at the top or bottom of stairways, or secure the rugs with carpet tape to prevent them from moving.  Make sure that you have a light switch at the top of the stairs and the bottom of the stairs. If you do not have them, have them installed. What are some other fall prevention tips?  Wear closed-toe shoes that fit well and support your feet. Wear shoes that have rubber soles or low heels.  When you use a stepladder, make sure that it is completely opened and that the sides are firmly locked. Have someone hold the ladder while you are using it. Do not climb a closed stepladder.  Add color or contrast paint or tape to grab bars and handrails in your home. Place contrasting color strips on the first and last steps.  Use mobility aids as needed, such as canes, walkers, scooters, and crutches.  Turn on lights if it is dark. Replace any light bulbs that burn out.  Set up furniture so that there are clear paths. Keep the furniture in the same spot.  Fix any uneven floor surfaces.  Choose a carpet design that does not hide the edge of steps of a stairway.  Be aware of any and all pets.  Review your medicines with your healthcare provider. Some medicines can cause dizziness or changes in blood pressure, which increase your risk of falling. Talk with your health care provider about other ways that you can decrease your risk of falls. This  may include working with a physical therapist or trainer to improve your strength, balance, and endurance. This information is not intended to replace advice given to you by your health care provider. Make sure you discuss any questions you have with your health care provider. Document Released: 04/30/2002 Document Revised: 10/07/2015 Document Reviewed: 06/14/2014 Elsevier Interactive Patient Education  2017 [employer].

## 2016-09-28 ENCOUNTER — Other Ambulatory Visit: Payer: Self-pay | Admitting: Pharmacist

## 2016-09-28 NOTE — Telephone Encounter (Signed)
Patient asked about calcium in livalo.  Explained that there is very little calcium in livalo.  Just in case and to set patient's mind at ease will check BMET alond with lipid panel in 6 weeks.

## 2016-10-15 ENCOUNTER — Other Ambulatory Visit: Payer: Self-pay | Admitting: Family Medicine

## 2016-10-19 ENCOUNTER — Telehealth: Payer: Self-pay | Admitting: Family Medicine

## 2016-10-20 ENCOUNTER — Other Ambulatory Visit: Payer: Medicare Other

## 2016-10-21 ENCOUNTER — Encounter: Payer: Self-pay | Admitting: Family Medicine

## 2016-10-21 ENCOUNTER — Other Ambulatory Visit: Payer: Self-pay | Admitting: *Deleted

## 2016-10-21 ENCOUNTER — Other Ambulatory Visit: Payer: Medicare Other

## 2016-10-21 DIAGNOSIS — E119 Type 2 diabetes mellitus without complications: Secondary | ICD-10-CM | POA: Diagnosis not present

## 2016-10-21 DIAGNOSIS — Z961 Presence of intraocular lens: Secondary | ICD-10-CM | POA: Diagnosis not present

## 2016-10-21 DIAGNOSIS — H353131 Nonexudative age-related macular degeneration, bilateral, early dry stage: Secondary | ICD-10-CM | POA: Diagnosis not present

## 2016-10-21 LAB — HM DIABETES EYE EXAM

## 2016-10-21 MED ORDER — PREGABALIN 50 MG PO CAPS: 50.0000 mg | ORAL_CAPSULE | Freq: Two times a day (BID) | ORAL | 1 refills | Status: DC

## 2016-11-01 ENCOUNTER — Other Ambulatory Visit: Payer: Medicare Other

## 2016-11-01 ENCOUNTER — Ambulatory Visit (INDEPENDENT_AMBULATORY_CARE_PROVIDER_SITE_OTHER): Payer: Medicare Other

## 2016-11-01 DIAGNOSIS — E781 Pure hyperglyceridemia: Secondary | ICD-10-CM

## 2016-11-01 DIAGNOSIS — Z780 Asymptomatic menopausal state: Secondary | ICD-10-CM

## 2016-11-02 ENCOUNTER — Other Ambulatory Visit: Payer: Self-pay | Admitting: Dermatology

## 2016-11-02 DIAGNOSIS — L814 Other melanin hyperpigmentation: Secondary | ICD-10-CM | POA: Diagnosis not present

## 2016-11-02 DIAGNOSIS — L570 Actinic keratosis: Secondary | ICD-10-CM | POA: Diagnosis not present

## 2016-11-02 DIAGNOSIS — L821 Other seborrheic keratosis: Secondary | ICD-10-CM | POA: Diagnosis not present

## 2016-11-02 DIAGNOSIS — D492 Neoplasm of unspecified behavior of bone, soft tissue, and skin: Secondary | ICD-10-CM | POA: Diagnosis not present

## 2016-11-02 DIAGNOSIS — D229 Melanocytic nevi, unspecified: Secondary | ICD-10-CM | POA: Diagnosis not present

## 2016-11-02 LAB — LDL CHOLESTEROL, DIRECT: LDL Direct: 99 mg/dL (ref 0–99)

## 2016-11-02 LAB — LIPID PANEL
CHOLESTEROL TOTAL: 187 mg/dL (ref 100–199)
Chol/HDL Ratio: 4 ratio (ref 0.0–4.4)
HDL: 47 mg/dL (ref >39)
LDL Calculated: 91 mg/dL (ref 0–99)
TRIGLYCERIDES: 247 mg/dL — AB (ref 0–149)
VLDL Cholesterol Cal: 49 mg/dL — ABNORMAL HIGH (ref 5–40)

## 2016-11-02 LAB — BASIC METABOLIC PANEL
BUN/Creatinine Ratio: 19 (ref 12–28)
BUN: 25 mg/dL (ref 8–27)
CALCIUM: 10.2 mg/dL (ref 8.7–10.3)
CO2: 24 mmol/L (ref 20–29)
CREATININE: 1.3 mg/dL — AB (ref 0.57–1.00)
Chloride: 103 mmol/L (ref 96–106)
GFR calc Af Amer: 45 mL/min/{1.73_m2} — ABNORMAL LOW (ref >59)
GFR, EST NON AFRICAN AMERICAN: 39 mL/min/{1.73_m2} — AB (ref >59)
Glucose: 127 mg/dL — ABNORMAL HIGH (ref 65–99)
Potassium: 5.2 mmol/L (ref 3.5–5.2)
Sodium: 142 mmol/L (ref 134–144)

## 2016-11-03 ENCOUNTER — Other Ambulatory Visit (INDEPENDENT_AMBULATORY_CARE_PROVIDER_SITE_OTHER): Payer: Self-pay | Admitting: Internal Medicine

## 2016-11-09 ENCOUNTER — Other Ambulatory Visit: Payer: Self-pay | Admitting: Internal Medicine

## 2016-11-09 ENCOUNTER — Other Ambulatory Visit: Payer: Self-pay | Admitting: *Deleted

## 2016-11-09 MED ORDER — PREGABALIN 50 MG PO CAPS
50.0000 mg | ORAL_CAPSULE | Freq: Two times a day (BID) | ORAL | 3 refills | Status: DC
Start: 2016-11-09 — End: 2017-01-27

## 2016-11-15 ENCOUNTER — Encounter: Payer: Self-pay | Admitting: Family Medicine

## 2016-12-06 DIAGNOSIS — H578 Other specified disorders of eye and adnexa: Secondary | ICD-10-CM | POA: Diagnosis not present

## 2016-12-13 DIAGNOSIS — H01025 Squamous blepharitis left lower eyelid: Secondary | ICD-10-CM | POA: Diagnosis not present

## 2016-12-13 DIAGNOSIS — Z961 Presence of intraocular lens: Secondary | ICD-10-CM | POA: Diagnosis not present

## 2016-12-13 DIAGNOSIS — H01021 Squamous blepharitis right upper eyelid: Secondary | ICD-10-CM | POA: Diagnosis not present

## 2016-12-13 DIAGNOSIS — H01024 Squamous blepharitis left upper eyelid: Secondary | ICD-10-CM | POA: Diagnosis not present

## 2016-12-13 DIAGNOSIS — H01022 Squamous blepharitis right lower eyelid: Secondary | ICD-10-CM | POA: Diagnosis not present

## 2016-12-19 ENCOUNTER — Other Ambulatory Visit: Payer: Self-pay | Admitting: Family Medicine

## 2016-12-31 ENCOUNTER — Other Ambulatory Visit: Payer: Self-pay | Admitting: Family Medicine

## 2016-12-31 NOTE — Telephone Encounter (Signed)
laSt seen 4.19/18  DWM

## 2017-01-10 ENCOUNTER — Encounter: Payer: Self-pay | Admitting: Family Medicine

## 2017-01-18 ENCOUNTER — Encounter: Payer: Self-pay | Admitting: Family Medicine

## 2017-01-20 ENCOUNTER — Encounter (INDEPENDENT_AMBULATORY_CARE_PROVIDER_SITE_OTHER): Payer: Self-pay | Admitting: Internal Medicine

## 2017-01-21 ENCOUNTER — Other Ambulatory Visit: Payer: Medicare Other

## 2017-01-21 DIAGNOSIS — I10 Essential (primary) hypertension: Secondary | ICD-10-CM | POA: Diagnosis not present

## 2017-01-21 DIAGNOSIS — E559 Vitamin D deficiency, unspecified: Secondary | ICD-10-CM

## 2017-01-21 DIAGNOSIS — E039 Hypothyroidism, unspecified: Secondary | ICD-10-CM

## 2017-01-21 DIAGNOSIS — E781 Pure hyperglyceridemia: Secondary | ICD-10-CM

## 2017-01-21 DIAGNOSIS — K219 Gastro-esophageal reflux disease without esophagitis: Secondary | ICD-10-CM

## 2017-01-21 DIAGNOSIS — E119 Type 2 diabetes mellitus without complications: Secondary | ICD-10-CM | POA: Diagnosis not present

## 2017-01-21 LAB — BAYER DCA HB A1C WAIVED: HB A1C: 6.1 % (ref <7.0)

## 2017-01-22 LAB — BMP8+EGFR
BUN/Creatinine Ratio: 19 (ref 12–28)
BUN: 27 mg/dL (ref 8–27)
CALCIUM: 10.7 mg/dL — AB (ref 8.7–10.3)
CO2: 24 mmol/L (ref 20–29)
Chloride: 102 mmol/L (ref 96–106)
Creatinine, Ser: 1.42 mg/dL — ABNORMAL HIGH (ref 0.57–1.00)
GFR, EST AFRICAN AMERICAN: 40 mL/min/{1.73_m2} — AB (ref >59)
GFR, EST NON AFRICAN AMERICAN: 35 mL/min/{1.73_m2} — AB (ref >59)
Glucose: 128 mg/dL — ABNORMAL HIGH (ref 65–99)
POTASSIUM: 5.2 mmol/L (ref 3.5–5.2)
Sodium: 142 mmol/L (ref 134–144)

## 2017-01-22 LAB — HEPATIC FUNCTION PANEL
ALK PHOS: 68 IU/L (ref 39–117)
ALT: 17 IU/L (ref 0–32)
AST: 21 IU/L (ref 0–40)
Albumin: 4.3 g/dL (ref 3.5–4.7)
BILIRUBIN, DIRECT: 0.09 mg/dL (ref 0.00–0.40)
Bilirubin Total: 0.3 mg/dL (ref 0.0–1.2)
Total Protein: 7.1 g/dL (ref 6.0–8.5)

## 2017-01-22 LAB — CBC WITH DIFFERENTIAL/PLATELET
BASOS: 1 %
Basophils Absolute: 0.1 10*3/uL (ref 0.0–0.2)
EOS (ABSOLUTE): 0.2 10*3/uL (ref 0.0–0.4)
EOS: 3 %
HEMATOCRIT: 41.9 % (ref 34.0–46.6)
HEMOGLOBIN: 13.9 g/dL (ref 11.1–15.9)
IMMATURE GRANS (ABS): 0.1 10*3/uL (ref 0.0–0.1)
Immature Granulocytes: 1 %
LYMPHS: 32 %
Lymphocytes Absolute: 2.5 10*3/uL (ref 0.7–3.1)
MCH: 30 pg (ref 26.6–33.0)
MCHC: 33.2 g/dL (ref 31.5–35.7)
MCV: 91 fL (ref 79–97)
Monocytes Absolute: 1 10*3/uL — ABNORMAL HIGH (ref 0.1–0.9)
Monocytes: 13 %
NEUTROS ABS: 4.1 10*3/uL (ref 1.4–7.0)
Neutrophils: 50 %
PLATELETS: 193 10*3/uL (ref 150–379)
RBC: 4.63 x10E6/uL (ref 3.77–5.28)
RDW: 14.4 % (ref 12.3–15.4)
WBC: 7.9 10*3/uL (ref 3.4–10.8)

## 2017-01-22 LAB — LIPID PANEL
Chol/HDL Ratio: 4.2 ratio (ref 0.0–4.4)
Cholesterol, Total: 195 mg/dL (ref 100–199)
HDL: 46 mg/dL (ref >39)
LDL Calculated: 89 mg/dL (ref 0–99)
Triglycerides: 298 mg/dL — ABNORMAL HIGH (ref 0–149)
VLDL Cholesterol Cal: 60 mg/dL — ABNORMAL HIGH (ref 5–40)

## 2017-01-22 LAB — VITAMIN D 25 HYDROXY (VIT D DEFICIENCY, FRACTURES): VIT D 25 HYDROXY: 40.5 ng/mL (ref 30.0–100.0)

## 2017-01-25 ENCOUNTER — Telehealth: Payer: Self-pay | Admitting: Family Medicine

## 2017-01-25 NOTE — Telephone Encounter (Signed)
Pt aware.

## 2017-01-27 ENCOUNTER — Ambulatory Visit (INDEPENDENT_AMBULATORY_CARE_PROVIDER_SITE_OTHER): Payer: Medicare Other | Admitting: Family Medicine

## 2017-01-27 ENCOUNTER — Other Ambulatory Visit: Payer: Self-pay | Admitting: *Deleted

## 2017-01-27 ENCOUNTER — Encounter: Payer: Self-pay | Admitting: Family Medicine

## 2017-01-27 VITALS — BP 162/90 | HR 64 | Temp 97.2°F | Ht 63.0 in | Wt 173.0 lb

## 2017-01-27 DIAGNOSIS — E559 Vitamin D deficiency, unspecified: Secondary | ICD-10-CM | POA: Diagnosis not present

## 2017-01-27 DIAGNOSIS — I10 Essential (primary) hypertension: Secondary | ICD-10-CM | POA: Diagnosis not present

## 2017-01-27 DIAGNOSIS — E119 Type 2 diabetes mellitus without complications: Secondary | ICD-10-CM

## 2017-01-27 DIAGNOSIS — M79671 Pain in right foot: Secondary | ICD-10-CM

## 2017-01-27 DIAGNOSIS — K219 Gastro-esophageal reflux disease without esophagitis: Secondary | ICD-10-CM

## 2017-01-27 DIAGNOSIS — M79672 Pain in left foot: Secondary | ICD-10-CM

## 2017-01-27 DIAGNOSIS — E039 Hypothyroidism, unspecified: Secondary | ICD-10-CM

## 2017-01-27 DIAGNOSIS — E781 Pure hyperglyceridemia: Secondary | ICD-10-CM | POA: Diagnosis not present

## 2017-01-27 DIAGNOSIS — Z7689 Persons encountering health services in other specified circumstances: Secondary | ICD-10-CM | POA: Diagnosis not present

## 2017-01-27 DIAGNOSIS — Z23 Encounter for immunization: Secondary | ICD-10-CM

## 2017-01-27 MED ORDER — METFORMIN HCL 500 MG PO TABS: ORAL_TABLET | ORAL | 3 refills | Status: DC

## 2017-01-27 MED ORDER — AMLODIPINE BESYLATE 5 MG PO TABS: 5.0000 mg | ORAL_TABLET | Freq: Every day | ORAL | 1 refills | Status: DC

## 2017-01-27 MED ORDER — ATENOLOL 50 MG PO TABS: 50.0000 mg | ORAL_TABLET | Freq: Every day | ORAL | 3 refills | Status: DC

## 2017-01-27 MED ORDER — IRBESARTAN 150 MG PO TABS: 150.0000 mg | ORAL_TABLET | Freq: Every day | ORAL | 3 refills | Status: DC

## 2017-01-27 MED ORDER — GABAPENTIN 100 MG PO CAPS: ORAL_CAPSULE | ORAL | 1 refills | Status: DC

## 2017-01-27 NOTE — Patient Instructions (Addendum)
Medicare Annual Wellness Visit  [facility] and the medical providers at [facility] strive to bring you the best medical care.  In doing so we not only want to address your current medical conditions and concerns but also to detect new conditions early and prevent illness, disease and health-related problems.    Medicare offers a yearly Wellness Visit which allows our clinical staff to assess your need for preventative services including immunizations, lifestyle education, counseling to decrease risk of preventable diseases and screening for fall risk and other medical concerns.    This visit is provided free of charge (no copay) for all Medicare recipients. The clinical pharmacists at [facility] have begun to conduct these Wellness Visits which will also include a thorough review of all your medications.    As you primary medical provider recommend that you make an appointment for your Annual Wellness Visit if you have not done so already this year.  You may set up this appointment before you leave today or you may call back ([phone]) and schedule an appointment.  Please make sure when you call that you mention that you are scheduling your Annual Wellness Visit with the clinical pharmacist so that the appointment may be made for the proper length of time.    Continue current medications. Continue good therapeutic lifestyle changes which include good diet and exercise. Fall precautions discussed with patient. If an FOBT was given today- please return it to our front desk. If you are over 80 years old - you may need Prevnar 72 or the adult Pneumonia vaccine.  **Flu shots are available--- please call and schedule a FLU-CLINIC appointment**  After your visit with [place] today you will receive a survey in the mail or online from [employer] regarding your care with [place]. Please take a moment to fill this out. Your feedback is very  important to [place] as you can help [place] better understand your patient needs as well as improve your experience and satisfaction. WE CARE ABOUT YOU!!!   The patient will increase her Katrine Coho fish oil and take 2 g twice daily She will continue to try to walk and exercise and eat healthy and drink plenty of water and fluids She will try the gabapentin starting with 100 mg nightly and increasing this up to 300 mg nightly over the next month depending on whether it helps or not. If it helps at 100 she will stay on 100 and so on. She should keep her upcoming appointments with nephrology and gastroenterology as planned She should increase her amlodipine to 5 mg daily and can finish taking the 2-1/2 mg daily by taking 2 of these daily. She should continue to watch her sodium intake closely

## 2017-01-27 NOTE — Addendum Note (Signed)
Addended by: Zannie Cove on: 01/27/2017 10:14 AM   Modules accepted: Orders

## 2017-01-27 NOTE — Progress Notes (Signed)
Subjective:    Patient ID: Lisa Sutton, female    DOB: 08-11-1933, 83 y.o.   MRN: [id number]  HPI Pt here for follow up and management of chronic medical problems which includes diabetes, hyperlipidemia and hypertension. She is taking medication regularly.The patient complains of her feet burning and being painful. She also is concerned about her serum calcium level. She is requesting refills on 3 different medicines including atenolol metformin [employer]. Her blood pressure today was elevated initially. She will get her first shingles shot today of the new shingles vaccine. She'll be given an FOBT to return and we will review her lab work with her during the visit today. Cholesterol numbers with traditional lipid testing have an elevated triglyceride at 298. The LDL C was good at 89 and total cholesterol good at 195. HDL was also within normal limits. Blood sugars elevated at 128. The creatinine, the most important kidney function test was elevated at 1.4 to in the serum calcium was elevated at 10.7 and had not been elevated in the past. We will recheck a BMP today for confirmation of these abnormal findings. The CBC had a normal white blood cell count and a normal hemoglobin at 13.9. The platelet count was adequate. All liver function tests were within normal limits. Vitamin D level was good at 40.5 and she will continue with current treatment. The hemoglobin A1c was stable at 6.1%. The patient says that she tried Lyrica for the burning in her feet and this helped but it caused food retention and edema and she discontinued this. She has had problems with the serum calcium in the past and caused her to be in the hospital with this problem at one time. We will repeat the BMP as indicated. She has a return appointment with her nephrologist in November but if the serum calcium comes back more elevated we will consider getting an earlier appointment with him. She says that her blood pressures at  home have been running in the 150-78-85 range for the bottom number. She will get her shingles  shot today.      Patient Active Problem List   Diagnosis Date Noted  . Upper airway cough syndrome 09/14/2016  . Hemoptysis 07/16/2016  . Acute pain of right knee 06/02/2016  . Hypothyroidism due to acquired atrophy of thyroid 04/26/2016  . Vitamin D deficiency 04/26/2016  . Status post total left knee replacement 09/15/2015  . Statin intolerance 09/11/2015  . Peripheral neuropathy 07/30/2015  . Abnormal chest x-ray 11/27/2014  . Protein-calorie malnutrition, severe ([place]) 11/27/2014  . AKI (acute kidney injury) ([place]) 11/26/2014  . Hypercalcemia 11/26/2014  . Leukocytosis 11/26/2014  . Weight loss 11/26/2014  . Weakness 11/26/2014  . Insomnia 04/12/2013  . Hyperlipidemia 04/12/2013  . Essential hypertension, benign 10/13/2012  . GERD (gastroesophageal reflux disease) 04/25/2012  . Fatty liver 08/09/2011  . Hepatitis B 08/09/2011  . Dysphagia 08/09/2011  . Type 2 diabetes mellitus ([place]) 08/09/2011  . Hypothyroid 08/09/2011   Outpatient Encounter Prescriptions as of 01/27/2017  Medication Sig  . ACCU-CHEK AVIVA PLUS test strip CHECK BLOOD SUGAR TWO TIMES DAILY  . ALPRAZolam (XANAX) 0.25 MG tablet Take 1 tablet (0.25 mg total) by mouth 2 (two) times daily as needed for anxiety.  Marland Kitchen amLODipine (NORVASC) 2.5 MG tablet TAKE 1 TABLET BY MOUTH  DAILY (Patient taking differently: Take 2.5mg s by mouth every evening)  . aspirin 81 MG chewable tablet Chew 81 mg by mouth every evening.   Marland Kitchen atenolol (  TENORMIN) 50 MG tablet TAKE 1 TABLET BY MOUTH  DAILY  . Cholecalciferol (VITAMIN D3) 1000 units CAPS Take 1 capsule by mouth daily.  Marland Kitchen CINNAMON PO Take 2,000 mg by mouth daily.  . irbesartan (AVAPRO) 150 MG tablet Take 1 tablet (150 mg total) by mouth daily.  Marland Kitchen levothyroxine (SYNTHROID, LEVOTHROID) 112 MCG tablet TAKE 1 TABLET BY MOUTH  DAILY  . levothyroxine (SYNTHROID, LEVOTHROID) 25 MCG tablet  TAKE 1 TABLET BY MOUTH  DAILY BEFORE BREAKFAST (Patient taking differently: Take 63mcg on Mondays,Wednesday and Fridays with the 112mg  levothyroxine)  . metFORMIN (GLUCOPHAGE) 500 MG tablet TAKE 1 TABLET BY MOUTH TWO  TIMES DAILY WITH MEALS  . Multiple Vitamin (MULTIVITAMIN WITH MINERALS) TABS tablet Take 1 tablet by mouth daily.  . pantoprazole (PROTONIX) 40 MG tablet TAKE (1) TABLET TWICE A DAY BEFORE A MEAL  . Pitavastatin Calcium (LIVALO) 4 MG TABS Take 1 tablet (4 mg total) by mouth every other day.  . vitamin C (ASCORBIC ACID) 500 MG tablet Take 1,000 mg by mouth daily.  Marland Kitchen acetaminophen (TYLENOL) 325 MG tablet Take 650 mg by mouth every 6 (six) hours as needed.  . [DISCONTINUED] naproxen sodium (ANAPROX) 220 MG tablet Take 220 mg by mouth daily as needed (pain).  . [DISCONTINUED] pregabalin (LYRICA) 50 MG capsule Take 1 capsule (50 mg total) by mouth 2 (two) times daily.   No facility-administered encounter medications on file as of 01/27/2017.      Review of Systems  Constitutional: Negative.        Trouble sleeping  HENT: Negative.   Eyes: Negative.   Respiratory: Negative.   Cardiovascular: Negative.   Gastrointestinal: Negative.   Endocrine: Negative.   Genitourinary: Negative.   Musculoskeletal: Positive for arthralgias (bilateral foot pain and burning).  Skin: Negative.   Allergic/Immunologic: Negative.   Neurological: Negative.   Hematological: Negative.   Psychiatric/Behavioral: Negative.        Objective:   Physical Exam  Constitutional: She is oriented to person, place, and time. She appears well-developed and well-nourished.  The patient is pleasant and alert and says that  she needs to get more physical activity  HENT:  Head: Normocephalic and atraumatic.  Right Ear: External ear normal.  Left Ear: External ear normal.  Nose: Nose normal.  Mouth/Throat: Oropharynx is clear and moist. No oropharyngeal exudate.  Eyes: Pupils are equal, round, and reactive to  light. Conjunctivae and EOM are normal. Right eye exhibits no discharge. Left eye exhibits no discharge. No scleral icterus.  Neck: Normal range of motion. Neck supple. No thyromegaly present.  No bruits thyromegaly or anterior cervical adenopathy  Cardiovascular: Normal rate, regular rhythm, normal heart sounds and intact distal pulses.   No murmur heard. The heart has a regular rate and rhythm at 72/m  Pulmonary/Chest: Effort normal and breath sounds normal. No respiratory distress. She has no wheezes. She has no rales.  Clear anteriorly and posteriorly  Abdominal: Soft. Bowel sounds are normal. She exhibits no mass. There is no tenderness. There is no rebound and no guarding.  No abdominal tenderness masses or organ enlargement or bruits. Lower abdominal incisional scar present.  Musculoskeletal: Normal range of motion. She exhibits no edema.  History of left knee replacement  Lymphadenopathy:    She has no cervical adenopathy.  Neurological: She is alert and oriented to person, place, and time. She has normal reflexes. No cranial nerve deficit.  Skin: Skin is warm and dry. No rash noted.  Psychiatric: She has a  normal mood and affect. Her behavior is normal. Judgment and thought content normal.  Nursing note and vitals reviewed.  BP (!) 161/85 (BP Location: Left Arm)   Pulse 64   Temp (!) 97.2 F (36.2 C) (Oral)   Ht 5\' 3"  (1.6 m)   Wt 173 lb (78.5 kg)   BMI 30.65 kg/m         Assessment & Plan:  1. Hypertriglyceridemia -The patient's triglycerides remain elevated. They are lower than in the past. She is tolerating Livalo 2 mg once daily. She is only taking Wyvonne Lenz once daily and we will have her increase this to 2 twice daily.  2. Essential hypertension, benign -The blood pressure is elevated on 2 occasions in the office. Her home readings have been running in the 150s. We will have her increase her amlodipine to 5 mg daily. We reminded her of the side effects of  this medicine which include constipation and edema.  3. Hypothyroidism, unspecified type -Continue current treatment pending results of lab work  4. Type 2 diabetes mellitus without complication, without long-term current use of insulin ([place]) -Continue current treatment  5. Vitamin D deficiency -Continue current treatment  6. Gastroesophageal reflux disease without esophagitis -Restart proton X at 40 mg twice daily  7. Need for zoster vaccination - Varicella-zoster vaccine IM (Shingrix)  8. Hypercalcemia -Recheck BMP and if calcium remains elevated we will discuss with nephrology.  Meds ordered this encounter  Medications  . metFORMIN (GLUCOPHAGE) 500 MG tablet    Sig: TAKE 1 TABLET BY MOUTH TWO  TIMES DAILY WITH MEALS    Dispense:  180 tablet    Refill:  3  . irbesartan (AVAPRO) 150 MG tablet    Sig: Take 1 tablet (150 mg total) by mouth daily.    Dispense:  90 tablet    Refill:  3  . atenolol (TENORMIN) 50 MG tablet    Sig: Take 1 tablet (50 mg total) by mouth daily.    Dispense:  90 tablet    Refill:  3  . gabapentin (NEURONTIN) 100 MG capsule    Sig: Take 1 to 3 caps po daily as directed    Dispense:  90 capsule    Refill:  1   Patient Instructions                       Medicare Annual Wellness Visit  [facility] and the medical providers at [facility] strive to bring you the best medical care.  In doing so we not only want to address your current medical conditions and concerns but also to detect new conditions early and prevent illness, disease and health-related problems.    Medicare offers a yearly Wellness Visit which allows our clinical staff to assess your need for preventative services including immunizations, lifestyle education, counseling to decrease risk of preventable diseases and screening for fall risk and other medical concerns.    This visit is provided free of charge (no copay) for all Medicare recipients. The clinical  pharmacists at [facility] have begun to conduct these Wellness Visits which will also include a thorough review of all your medications.    As you primary medical provider recommend that you make an appointment for your Annual Wellness Visit if you have not done so already this year.  You may set up this appointment before you leave today or you may call back ([phone]) and schedule an appointment.  Please make sure when you call that you mention that you are scheduling your Annual Wellness Visit with the clinical pharmacist so that the appointment may be made for the proper length of time.    Continue current medications. Continue good therapeutic lifestyle changes which include good diet and exercise. Fall precautions discussed with patient. If an FOBT was given today- please return it to our front desk. If you are over 38 years old - you may need Prevnar 26 or the adult Pneumonia vaccine.  **Flu shots are available--- please call and schedule a FLU-CLINIC appointment**  After your visit with [place] today you will receive a survey in the mail or online from [employer] regarding your care with [place]. Please take a moment to fill this out. Your feedback is very important to [place] as you can help [place] better understand your patient needs as well as improve your experience and satisfaction. WE CARE ABOUT YOU!!!   The patient will increase her Katrine Coho fish oil and take 2 g twice daily She will continue to try to walk and exercise and eat healthy and drink plenty of water and fluids She will try the gabapentin starting with 100 mg nightly and increasing this up to 300 mg nightly over the next month depending on whether it helps or not. If it helps at 100 she will stay on 100 and so on. She should keep her upcoming appointments with nephrology and gastroenterology as planned She should increase her amlodipine to 5 mg daily and can finish taking the 2-1/2 mg daily by taking 2 of these  daily. She should continue to watch her sodium intake closely     Arrie Senate MD

## 2017-01-28 LAB — THYROID PANEL WITH TSH
FREE THYROXINE INDEX: 2.3 (ref 1.2–4.9)
T3 UPTAKE RATIO: 27 % (ref 24–39)
T4 TOTAL: 8.7 ug/dL (ref 4.5–12.0)
TSH: 2.58 u[IU]/mL (ref 0.450–4.500)

## 2017-01-28 LAB — VITAMIN B12

## 2017-01-29 LAB — BMP8+EGFR
BUN / CREAT RATIO: 18 (ref 12–28)
BUN: 23 mg/dL (ref 8–27)
CHLORIDE: 101 mmol/L (ref 96–106)
CO2: 20 mmol/L (ref 20–29)
Calcium: 9.4 mg/dL (ref 8.7–10.3)
Creatinine, Ser: 1.3 mg/dL — ABNORMAL HIGH (ref 0.57–1.00)
GFR calc non Af Amer: 39 mL/min/{1.73_m2} — ABNORMAL LOW (ref >59)
GFR, EST AFRICAN AMERICAN: 44 mL/min/{1.73_m2} — AB (ref >59)
Glucose: 93 mg/dL (ref 65–99)
POTASSIUM: 5.3 mmol/L — AB (ref 3.5–5.2)
SODIUM: 137 mmol/L (ref 134–144)

## 2017-01-29 LAB — SPECIMEN STATUS REPORT

## 2017-01-31 ENCOUNTER — Other Ambulatory Visit: Payer: Medicare Other

## 2017-01-31 DIAGNOSIS — Z1211 Encounter for screening for malignant neoplasm of colon: Secondary | ICD-10-CM

## 2017-02-01 LAB — FECAL OCCULT BLOOD, IMMUNOCHEMICAL: FECAL OCCULT BLD: NEGATIVE

## 2017-02-22 ENCOUNTER — Encounter (INDEPENDENT_AMBULATORY_CARE_PROVIDER_SITE_OTHER): Payer: Self-pay | Admitting: Internal Medicine

## 2017-02-22 ENCOUNTER — Telehealth: Payer: Self-pay | Admitting: Family Medicine

## 2017-02-22 ENCOUNTER — Ambulatory Visit (INDEPENDENT_AMBULATORY_CARE_PROVIDER_SITE_OTHER): Payer: Medicare Other | Admitting: Internal Medicine

## 2017-02-22 VITALS — BP 130/70 | HR 66 | Temp 97.1°F | Resp 18 | Ht 63.0 in | Wt 171.9 lb

## 2017-02-22 DIAGNOSIS — B181 Chronic viral hepatitis B without delta-agent: Secondary | ICD-10-CM | POA: Diagnosis not present

## 2017-02-22 DIAGNOSIS — K219 Gastro-esophageal reflux disease without esophagitis: Secondary | ICD-10-CM

## 2017-02-22 DIAGNOSIS — R197 Diarrhea, unspecified: Secondary | ICD-10-CM | POA: Diagnosis not present

## 2017-02-22 MED ORDER — FAMOTIDINE 20 MG PO TABS: 20.0000 mg | ORAL_TABLET | Freq: Every evening | ORAL | Status: DC | PRN

## 2017-02-22 MED ORDER — PANTOPRAZOLE SODIUM 40 MG PO TBEC: 40.0000 mg | DELAYED_RELEASE_TABLET | Freq: Every day | ORAL | 3 refills | Status: DC

## 2017-02-22 NOTE — Patient Instructions (Addendum)
Please check with Dr. Laurance Flatten if  you could take another medication for diabetes in place of metformin.  If diarrhea persists off metformin will consider diagnostic colonoscopy.

## 2017-02-22 NOTE — Progress Notes (Signed)
Presenting complaint;  Follow-up for GERD and history of inactive HBsAg carrier. Complains of diarrhea.  Subjective:  Patient is 83 year old Caucasian female who is here for scheduled visit. She was last seen in for every 2018. She states she is doing well from GI standpoint. She has heartburn once in a while with certain foods. She denies dysphagia nausea or vomiting. She also denies abdominal pain but she complains of worsening diarrhea. She says she has not had formed stool in the last couple of months. She has 2-4 stools per day. Most of her bowel movements occur in the morning. She denies melena or rectal bleeding. Her appetite remains good. She has gained 4 pounds since last visit. She states she had accident yesterday. She was not able to make the bathroom. She has not taken any antibiotics recently. She complains of burning potter foot pain. She is on gabapentin which has helped some.    Current Medications: Outpatient Encounter Prescriptions as of 02/22/2017  Medication Sig  . ACCU-CHEK AVIVA PLUS test strip CHECK BLOOD SUGAR TWO TIMES DAILY  . acetaminophen (TYLENOL) 325 MG tablet Take 650 mg by mouth every 6 (six) hours as needed.  . ALPRAZolam (XANAX) 0.25 MG tablet Take 1 tablet (0.25 mg total) by mouth 2 (two) times daily as needed for anxiety.  Marland Kitchen amLODipine (NORVASC) 5 MG tablet Take 1 tablet (5 mg total) by mouth daily.  Marland Kitchen aspirin 81 MG chewable tablet Chew 81 mg by mouth every evening.   Marland Kitchen atenolol (TENORMIN) 50 MG tablet Take 1 tablet (50 mg total) by mouth daily.  . Cholecalciferol (VITAMIN D3) 1000 units CAPS Take 1 capsule by mouth daily.  Marland Kitchen CINNAMON PO Take 2,000 mg by mouth daily.  Marland Kitchen gabapentin (NEURONTIN) 100 MG capsule Take 1 to 3 caps po daily as directed  . irbesartan (AVAPRO) 150 MG tablet Take 1 tablet (150 mg total) by mouth daily.  Marland Kitchen levothyroxine (SYNTHROID, LEVOTHROID) 112 MCG tablet TAKE 1 TABLET BY MOUTH  DAILY  . levothyroxine (SYNTHROID, LEVOTHROID) 25 MCG  tablet TAKE 1 TABLET BY MOUTH  DAILY BEFORE BREAKFAST (Patient taking differently: Take 59mcg on Mondays,Wednesday and Fridays with the 112mg  levothyroxine)  . metFORMIN (GLUCOPHAGE) 500 MG tablet TAKE 1 TABLET BY MOUTH TWO  TIMES DAILY WITH MEALS  . Multiple Vitamin (MULTIVITAMIN WITH MINERALS) TABS tablet Take 1 tablet by mouth daily.  . pantoprazole (PROTONIX) 40 MG tablet TAKE (1) TABLET TWICE A DAY BEFORE A MEAL  . Pitavastatin Calcium (LIVALO) 4 MG TABS Take 1 tablet (4 mg total) by mouth every other day.  Marland Kitchen SHINGRIX injection   . [DISCONTINUED] vitamin C (ASCORBIC ACID) 500 MG tablet Take 1,000 mg by mouth daily.   No facility-administered encounter medications on file as of 02/22/2017.      Objective: Blood pressure 130/70, pulse 66, temperature (!) 97.1 F (36.2 C), temperature source Oral, resp. rate 18, height 5\' 3"  (1.6 m), weight 171 lb 14.4 oz (78 kg). Patient is alert and in no acute distress. Conjunctiva is pink. Sclera is nonicteric Oropharyngeal mucosa is normal. No neck masses or thyromegaly noted. Cardiac exam with regular rhythm normal S1 and S2. No murmur or gallop noted. Lungs are clear to auscultation. Abdomen is full but soft and nontender without organomegaly or masses. No LE edema or clubbing noted.  Labs/studies Results: Lab data from 01/21/2017   WBC 7.9, H&H 13.9 and 41.9 and platelet count 193K.  Bilirubin 0.3, AP 68, AST 21, ALT 17, total protein 7.1 and  albumin 4.3.  Assessment:  #1. GERD. She is doing well with double dose PPI. She had EGD in March this year revealing LA grade A esophagitis. I believe she will tolerate dose reduction.  #2. Diarrhea. She does not have alarm symptoms. Recent fecal occult blood test was negative. Suspect diarrhea secondary to metformin. If possible she may be switched to another agent. If diarrhea persists she will need further workup.  #3. History of inactive HBsAg carrier. She was found to be HBsAg positive in 2009.  HPV DNA remained undetectable. Appetite is be surface antigen became undetectable about 3 years ago but she never formed hepatitis B surface antibody. Therefore she has not developed immunity. If she ever needs immunosuppressive therapy in future she will need to be treated with Proteus and everters simultaneously.  #4. Remote history of colonic adenoma. She had single adenoma removed in 2000. Colonoscopy in 2005 and October 2010 did not reveal any more adenomas. Patient not sure if she wants to undergo any more colonoscopies unless she has to. She may need 1 more for diagnostic purposes if diarrhea persists.   Plan:  Patient to check with Dr. Morrie Sheldon if she could be switched to another medication in place of metformin. Decrease pantoprazole to 40 mg by mouth every morning. She can take Pepcid OTC 20 mg daily at bedtime when necessary for breakthrough symptoms. Patient will call with progress report in a few weeks. Office visit in one year.

## 2017-02-22 NOTE — Telephone Encounter (Signed)
Please check with Dr. Laurance Flatten if  you could take another medication for diabetes in place of metformin  (note per DR Laural Golden 02/22/17)   She is currently taking metformin 500 mg BID

## 2017-02-23 MED ORDER — SITAGLIPTIN PHOSPHATE 50 MG PO TABS: 50.0000 mg | ORAL_TABLET | Freq: Every day | ORAL | 0 refills | Status: DC

## 2017-02-23 NOTE — Telephone Encounter (Signed)
Thank you :)

## 2017-02-23 NOTE — Telephone Encounter (Signed)
Pt aware and new med sent to [facility]

## 2017-02-23 NOTE — Telephone Encounter (Signed)
As per recommendation of Dr. Laural Golden, discontinue metformin because of loose bowel movements and start Januvia 50 mg 1 daily. The patient should record blood sugars regularly over the next couple weeks fasting and before meals during the day periodically and bring these readings by for review in a couple weeks

## 2017-02-24 ENCOUNTER — Encounter: Payer: Self-pay | Admitting: Family Medicine

## 2017-02-25 ENCOUNTER — Ambulatory Visit (INDEPENDENT_AMBULATORY_CARE_PROVIDER_SITE_OTHER): Payer: Medicare Other | Admitting: *Deleted

## 2017-02-25 ENCOUNTER — Other Ambulatory Visit: Payer: Self-pay | Admitting: *Deleted

## 2017-02-25 ENCOUNTER — Telehealth: Payer: Self-pay | Admitting: *Deleted

## 2017-02-25 DIAGNOSIS — Z23 Encounter for immunization: Secondary | ICD-10-CM

## 2017-02-25 MED ORDER — GLYBURIDE 2.5 MG PO TABS: 2.5000 mg | ORAL_TABLET | Freq: Every day | ORAL | 1 refills | Status: DC

## 2017-02-25 NOTE — Telephone Encounter (Signed)
Do glyburide 2.5 mg one half daily. Monitor blood sugars closely for the next 7-10 days and bring readings by for review and at that point in time we may have to increase the medication.

## 2017-02-25 NOTE — Telephone Encounter (Signed)
Med sent in - need to discuss with the pt - directions for the first week will be different.

## 2017-02-25 NOTE — Telephone Encounter (Signed)
Januvia was too expensive - we had sent in januvia 50 mg daily  Alternatives are :  Glyburide  Actos Diabinese Onglyza (will be expensive) And  Metformin (which was stopped)  Do you prefer we try any of these?

## 2017-02-25 NOTE — Telephone Encounter (Signed)
Aware of all directions

## 2017-03-09 ENCOUNTER — Telehealth: Payer: Self-pay | Admitting: Family Medicine

## 2017-03-09 ENCOUNTER — Other Ambulatory Visit: Payer: Self-pay | Admitting: Family Medicine

## 2017-03-09 MED ORDER — DIPHENOXYLATE-ATROPINE 2.5-0.025 MG PO TABS: ORAL_TABLET | ORAL | 0 refills | Status: DC

## 2017-03-09 NOTE — Telephone Encounter (Signed)
Previous message has been sent to [facility]- awaiting a response. Attempted to contact patient - left detailed message that we are awaiting on the providers feedback and will call her back.

## 2017-03-09 NOTE — Telephone Encounter (Signed)
Per [facility] _ call in Lomotil

## 2017-03-16 IMAGING — US US RENAL
2 series · 14 of 25 positions shown · non-contrast
Comparison: CT 01/02/2014.

CLINICAL DATA: Renal insufficiency.

EXAM:
RENAL / URINARY TRACT ULTRASOUND COMPLETE

[Series 1: us renal · 0.24mm/px · 13 of 42 slices shown (1 of 2)]
[im 1/42]
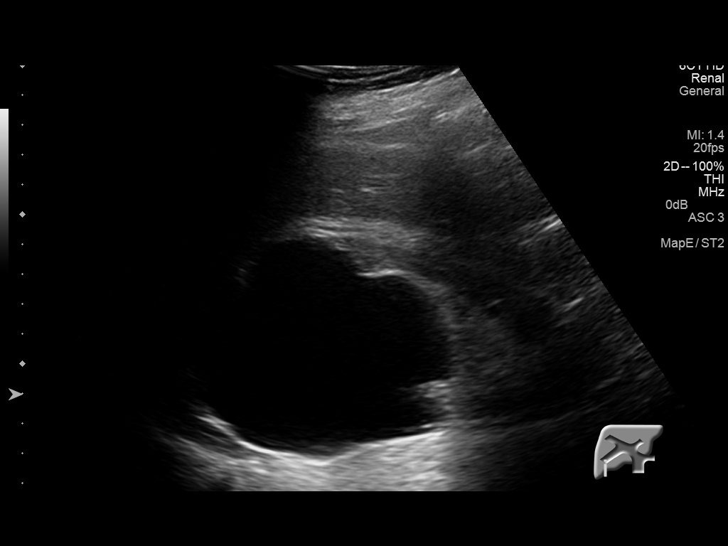
[im 4/42]
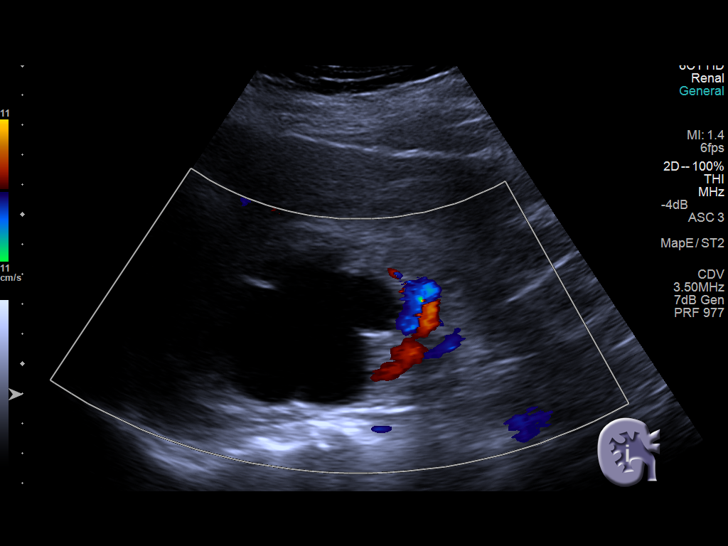
[im 8/42]
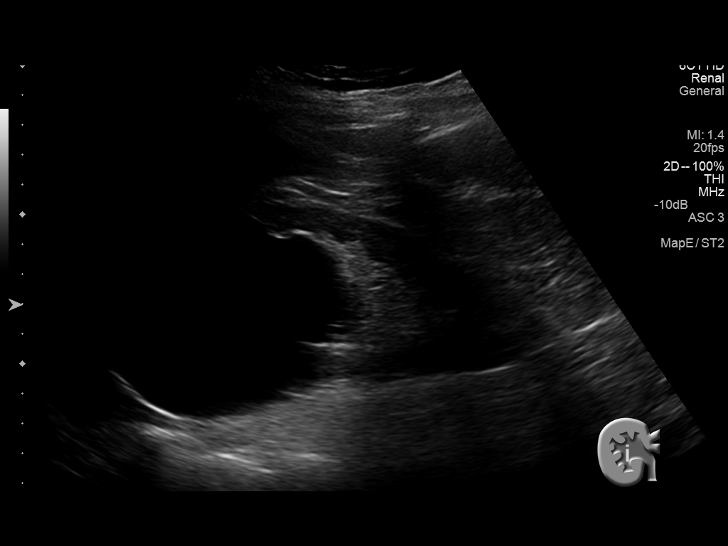
[im 11/42]
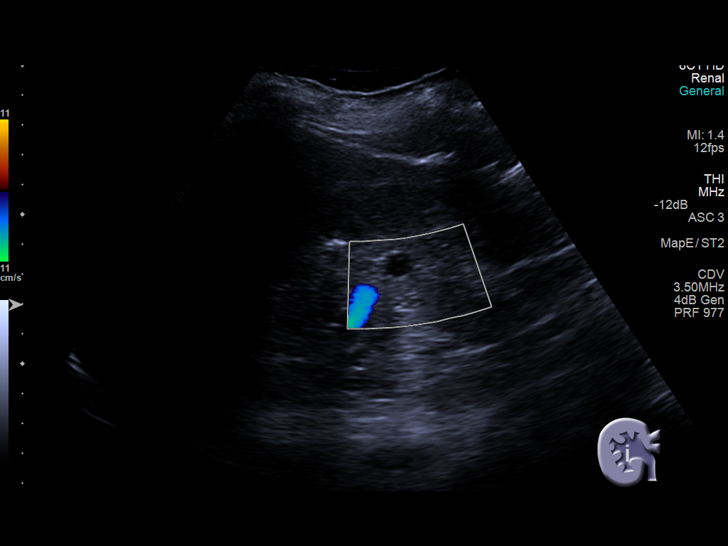
[im 15/42]
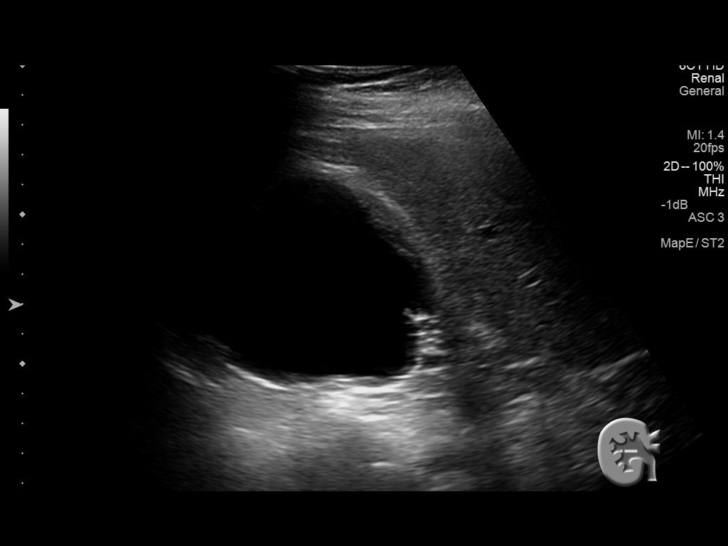
[im 17/42]
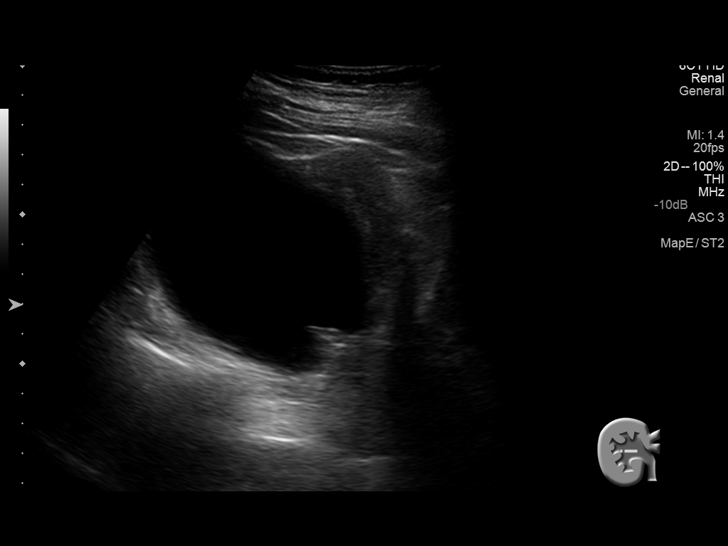
[im 20/42]
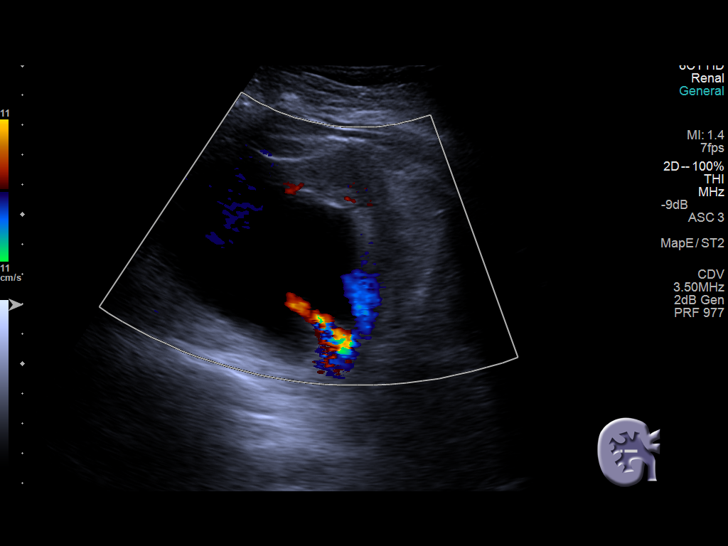
[im 24/42]
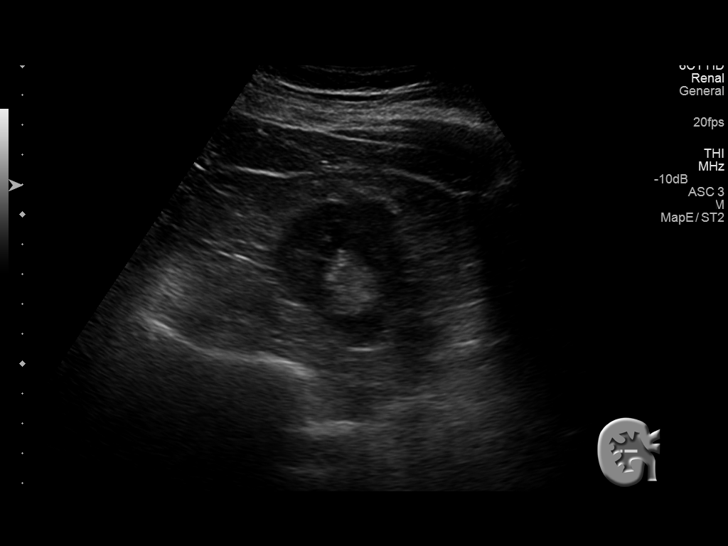
[im 27/42]
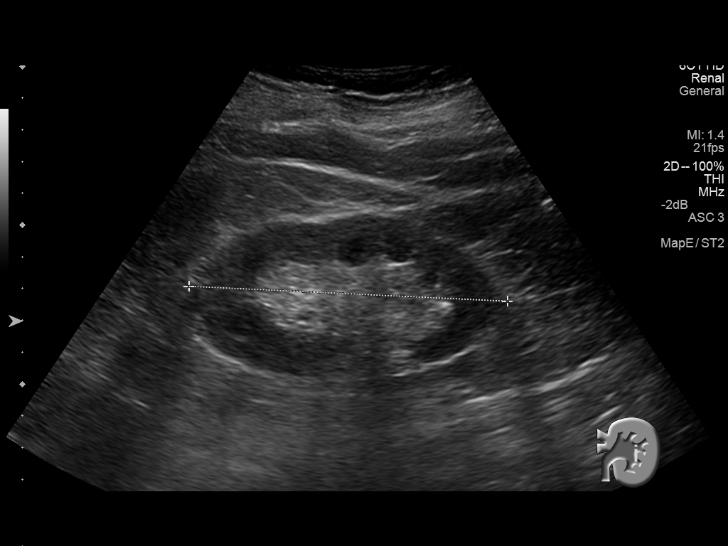
[im 29/42]
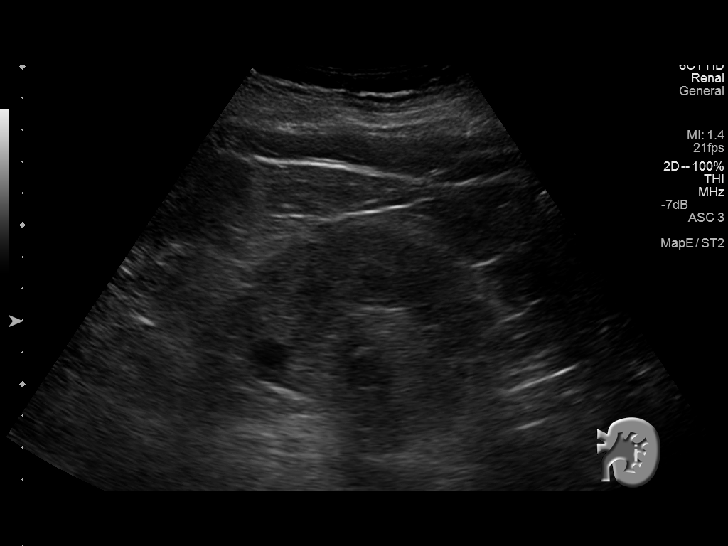
[im 33/42]
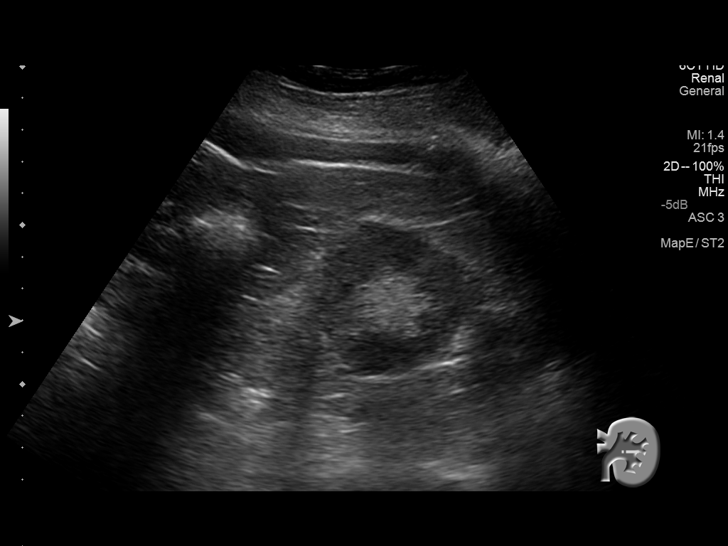
[im 36/42]
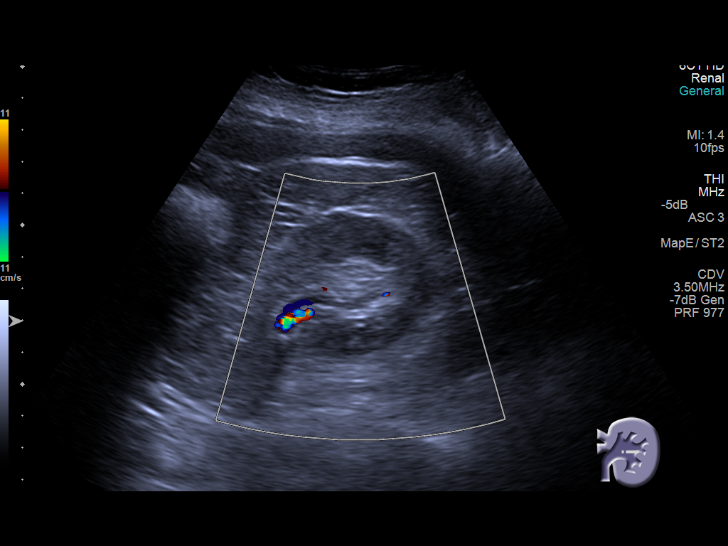
[im 40/42]
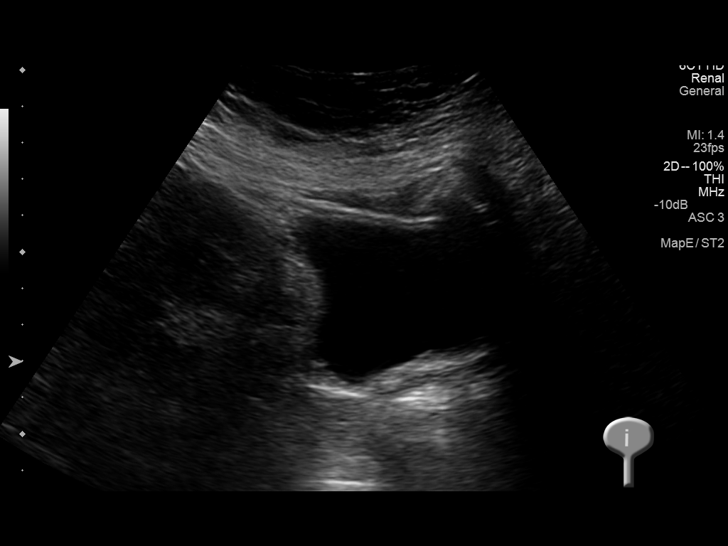

[Series 3: us renal · 0.22mm/px · 1 of 2 slices shown (2 of 2)]
[im 1/2]
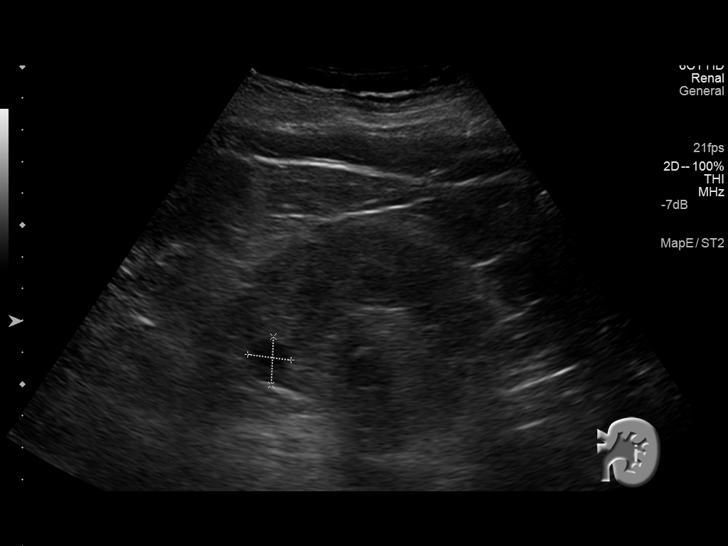

[14 of 25 positions shown; findings below may reference images not displayed]

FINDINGS: Right Kidney:

Length: 13.7 cm.. Echogenicity within normal limits. Simple renal
cysts are noted. The largest measures 8.9 x 7.3 x 8.9 cm. Similar
findings noted on prior CT. No hydronephrosis.

Left Kidney:

Length: 10.0 cm. Echogenicity within normal limits. Simple cysts are
noted left kidney, the largest measures 1.6 cm. No solid lesions
identified. Renal size discrepancy is from the presence of the large
cyst in the right kidney.

Bladder:

Appears normal for degree of bladder distention.
IMPRESSION: Simple renal cyst. Particularly prominent simple cysts is in the
right upper renal pole measuring 8.9 cm in maximum diameter. Similar
finding noted on prior CT. No acute abnormality. No hydronephrosis.

## 2017-03-25 DIAGNOSIS — R809 Proteinuria, unspecified: Secondary | ICD-10-CM | POA: Diagnosis not present

## 2017-03-25 DIAGNOSIS — N183 Chronic kidney disease, stage 3 (moderate): Secondary | ICD-10-CM | POA: Diagnosis not present

## 2017-03-25 DIAGNOSIS — D509 Iron deficiency anemia, unspecified: Secondary | ICD-10-CM | POA: Diagnosis not present

## 2017-03-25 DIAGNOSIS — E559 Vitamin D deficiency, unspecified: Secondary | ICD-10-CM | POA: Diagnosis not present

## 2017-03-25 DIAGNOSIS — Z79899 Other long term (current) drug therapy: Secondary | ICD-10-CM | POA: Diagnosis not present

## 2017-03-29 ENCOUNTER — Telehealth: Payer: Self-pay | Admitting: Family Medicine

## 2017-03-29 NOTE — Telephone Encounter (Signed)
Blood sugar readings 156 this am 110   yesterday 149 am 81- lunch  114-after dinner

## 2017-03-29 NOTE — Telephone Encounter (Signed)
She has been taking glyburide states metformin was causing diarrhea

## 2017-03-29 NOTE — Telephone Encounter (Signed)
Continue to monitor blood sugars regularly fasting and before meals and bring readings by for review and a couple of weeks and stay on current dose of glyburide

## 2017-03-29 NOTE — Telephone Encounter (Signed)
Please confirm with patient what medicine she is currently taking and how long she has been on this medication.

## 2017-03-30 DIAGNOSIS — N183 Chronic kidney disease, stage 3 (moderate): Secondary | ICD-10-CM | POA: Diagnosis not present

## 2017-03-30 DIAGNOSIS — I10 Essential (primary) hypertension: Secondary | ICD-10-CM | POA: Diagnosis not present

## 2017-03-30 DIAGNOSIS — E1129 Type 2 diabetes mellitus with other diabetic kidney complication: Secondary | ICD-10-CM | POA: Diagnosis not present

## 2017-03-30 DIAGNOSIS — R809 Proteinuria, unspecified: Secondary | ICD-10-CM | POA: Diagnosis not present

## 2017-03-30 NOTE — Telephone Encounter (Signed)
Patient aware of recommendation.  

## 2017-04-12 ENCOUNTER — Encounter: Payer: Self-pay | Admitting: Internal Medicine

## 2017-04-12 ENCOUNTER — Ambulatory Visit: Payer: Medicare Other | Admitting: Internal Medicine

## 2017-04-12 VITALS — BP 148/80 | HR 66 | Wt 178.0 lb

## 2017-04-12 DIAGNOSIS — Z8639 Personal history of other endocrine, nutritional and metabolic disease: Secondary | ICD-10-CM

## 2017-04-12 DIAGNOSIS — E1121 Type 2 diabetes mellitus with diabetic nephropathy: Secondary | ICD-10-CM | POA: Diagnosis not present

## 2017-04-12 DIAGNOSIS — G63 Polyneuropathy in diseases classified elsewhere: Secondary | ICD-10-CM

## 2017-04-12 HISTORY — DX: Personal history of other endocrine, nutritional and metabolic disease: Z86.39

## 2017-04-12 MED ORDER — METFORMIN HCL ER 500 MG PO TB24: 500.0000 mg | ORAL_TABLET | Freq: Two times a day (BID) | ORAL | 1 refills | Status: DC

## 2017-04-12 NOTE — Progress Notes (Signed)
Patient ID: Lisa Sutton, female   DOB: 08/05/1933, 83 y.o.   MRN: [id number]   HPI: Lisa Sutton is a 83 y.o.-year-old female whom I previously saw for hypercalcemia in 2016, now returning for DM2 (new condition for me), dx in 2015, non-insulin-dependent, uncontrolled, with complications (CKD, peripheral neuropathy).  Last hemoglobin A1c was: 01/2017: HbA1c 6.1%  Lab Results  Component Value Date   HGBA1C 6.2 (H) 09/03/2015   HGBA1C 5.9 05/27/2015   HGBA1C 6.1 03/13/2015   Pt is on a regimen of: - Glyburide 2.5 mg before b'fast - started this after she stopped Metformin in 01/2017 Could not tolerate regular metformin due to diarrhea. She could not afford Januvia.  Pt checks her sugars 2-4 a day and they are: - am: 118-146 - 2h after b'fast: n/c - before lunch: 61-93 - 2h after lunch: 110, 124, 203 - before dinner: 75-195, 208 - 2h after dinner: 136-196 - bedtime: 121-188, 201, 227 - nighttime: n/c Lowest sugar was 61; she has hypoglycemia awareness at 70.  Highest sugar was 227.  Glucometer: Accu-Chek Aviva  Pt's meals are: - Breakfast: 2 boiled eggs + toast + tomato or croissants + am and egg - Lunch: [place] sandwich + lettuce + tomato + mayo - Dinner: green beans, slaw + potatoes + meatloaf   - Snacks: crackers + PB Diet green tea.  -+ CKD - sees nephrology in [place], last BUN/creatinine:  Lab Results  Component Value Date   BUN 23 01/27/2017   BUN 27 01/21/2017   CREATININE 1.30 (H) 01/27/2017   CREATININE 1.42 (H) 01/21/2017  On irbesartan -+ HL; last set of lipids: Lab Results  Component Value Date   CHOL 195 01/21/2017   HDL 46 01/21/2017   LDLCALC 89 01/21/2017   LDLDIRECT 99 11/01/2016   TRIG 298 (H) 01/21/2017   CHOLHDL 4.2 01/21/2017  On Livalo. - last eye exam was in 09/2016.  No DR.  - + numbness and tingling in her feet. On Gabapentin 100 daily now.   She also has a history of hypothyroidism, and is on levothyroxine 112 mcg daily.   Latest TSH has been normal: Lab Results  Component Value Date   TSH 2.580 01/27/2017   Regarding her hypercalcemia (looked like this was 2/2 HCTZ >> calcium normalized after stopping the HCTZ), I reviewed latest level - normal, while her PTH was low:  Lab Results  Component Value Date   PTH 11 (L) 06/04/2015   PTH Comment 06/04/2015   PTH 11 (L) 05/29/2015   PTH Comment 05/29/2015   PTH 6 (L) 01/13/2015   PTH Comment 01/13/2015   PTH 12 (L) 12/02/2014   PTH 5 (L) 11/26/2014   CALCIUM 9.4 01/27/2017   CALCIUM 10.7 (H) 01/21/2017   CALCIUM 10.2 11/01/2016   CALCIUM 9.5 09/03/2016   CALCIUM 9.7 04/21/2016   CALCIUM 9.3 09/18/2015   CALCIUM 8.9 09/17/2015   CALCIUM 8.6 (L) 09/16/2015   CALCIUM 9.6 09/08/2015   CALCIUM 10.5 (H) 09/03/2015   CALCIUM 9.3 06/04/2015   CALCIUM CANCELED 05/29/2015   CALCIUM CANCELED 05/27/2015   CALCIUM 11.0 (H) 04/28/2015   CALCIUM 11.8 (H) 04/26/2015   CALCIUM 10.2 03/13/2015   CALCIUM 10.5 (H) 02/17/2015   CALCIUM 11.9 (H) 02/07/2015   CALCIUM 11.3 (H) 01/13/2015   CALCIUM 9.8 12/02/2014   Latest vitamin D was normal: Lab Results  Component Value Date   VD25OH 40.5 01/21/2017   VD25OH 32.9 09/03/2016   VD25OH 63.1 05/27/2015  VD25OH 37.8 03/13/2015   VD25OH 41.1 02/17/2015   VD25OH 53.3 10/17/2014   VD25OH 24.9 (L) 06/19/2014   VD25OH 23.4 (L) 03/11/2014   ROS: Constitutional: + weight gain, no fatigue, no subjective hyperthermia/hypothermia, + nocturia Eyes: no blurry vision, no xerophthalmia ENT: no sore throat, no nodules palpated in throat, no dysphagia/odynophagia, no hoarseness Cardiovascular: no CP/SOB/palpitations/leg swelling Respiratory: no cough/SOB Gastrointestinal: no N/V/+ D/no C Musculoskeletal: no muscle/joint aches Skin: no rashes Neurological: no tremors/numbness/tingling/dizziness  Past Medical History:  Diagnosis Date  . Anxiety   . Arthritis   . Cataract   . Dysphagia    'sometimes but not a major  issue' been checked out by GI (per pt)  . Dysrhythmia    'heart used to skip but doesn't anymore' was checked out by Dr. Ron Parker late '90s, everything checked out ok and not had any skipping since (all per pt)  . Essential hypertension, benign   . Fatty liver   . GERD (gastroesophageal reflux disease)   . Hepatitis B    had at age 8, 'GI doc said it's gone away'  . Hepatitis B surface antigen positive   . History of hiatal hernia   . Hypercalcemia   . Hypothyroidism   . Kidney stone August 2014   Patient [facility]  . Obesity   . Osteoporosis   . PONV (postoperative nausea and vomiting)   . Type 2 diabetes mellitus ([place])    Past Surgical History:  Procedure Laterality Date  . ABDOMINAL HYSTERECTOMY    . BILATERAL CARPAL TUNNEL RELEASE     Dr. Marlou Sa at surgical center  . CATARACT EXTRACTION, BILATERAL    . Deviated septum repair    . ESOPHAGOGASTRODUODENOSCOPY N/A 07/28/2016   Procedure: ESOPHAGOGASTRODUODENOSCOPY (EGD);  Surgeon: Rogene Houston, MD;  [facility];  Service: Endoscopy;  Laterality: N/A;  300 - moved to 3/7 @ 1:00  . EYE SURGERY    . KNEE ARTHROPLASTY Left 09/15/2015   Procedure: COMPUTER ASSISTED TOTAL KNEE ARTHROPLASTY;  Surgeon: Marybelle Killings, MD;  Location: [facility];  Service: Orthopedics;  Laterality: Left;  . KNEE ARTHROSCOPY     left knee 2003   Social History   Socioeconomic History  . Marital status: Widowed    Spouse name: Not on file  . Number of children: Not on file  . Years of education: Not on file  . Highest education level: Not on file  Social Needs  . Financial resource strain: Not on file  . Food insecurity - worry: Not on file  . Food insecurity - inability: Not on file  . Transportation needs - medical: Not on file  . Transportation needs - non-medical: Not on file  Occupational History  . Not on file  Tobacco Use  . Smoking status: Never Smoker  . Smokeless tobacco: Never Used  Substance and Sexual Activity  .  Alcohol use: No    Alcohol/week: 0.0 oz  . Drug use: No  . Sexual activity: No  Other Topics Concern  . Not on file  Social History Narrative  . Not on file   Current Outpatient Medications on File Prior to Visit  Medication Sig Dispense Refill  . ACCU-CHEK AVIVA PLUS test strip CHECK BLOOD SUGAR TWO TIMES DAILY 200 each 1  . acetaminophen (TYLENOL) 325 MG tablet Take 650 mg by mouth every 6 (six) hours as needed.    . ALPRAZolam (XANAX) 0.25 MG tablet Take 1 tablet (0.25 mg total) by mouth  2 (two) times daily as needed for anxiety. 180 tablet 1  . amLODipine (NORVASC) 5 MG tablet Take 1 tablet (5 mg total) by mouth daily. 90 tablet 1  . aspirin 81 MG chewable tablet Chew 81 mg by mouth every evening.     Marland Kitchen atenolol (TENORMIN) 50 MG tablet Take 1 tablet (50 mg total) by mouth daily. 90 tablet 3  . Cholecalciferol (VITAMIN D3) 1000 units CAPS Take 1 capsule by mouth daily.    . diphenoxylate-atropine (LOMOTIL) 2.5-0.025 MG tablet Take 2 tabs now and 1 every 4 hours PRN thereafter 30 tablet 0  . irbesartan (AVAPRO) 150 MG tablet Take 1 tablet (150 mg total) by mouth daily. 90 tablet 3  . levothyroxine (SYNTHROID, LEVOTHROID) 112 MCG tablet TAKE 1 TABLET BY MOUTH  DAILY 90 tablet 2  . levothyroxine (SYNTHROID, LEVOTHROID) 25 MCG tablet TAKE 1 TABLET BY MOUTH  DAILY BEFORE BREAKFAST (Patient taking differently: Take 46mcg on Mondays,Wednesday and Fridays with the 112mg  levothyroxine) 90 tablet 2  . Multiple Vitamin (MULTIVITAMIN WITH MINERALS) TABS tablet Take 1 tablet by mouth daily.    . pantoprazole (PROTONIX) 40 MG tablet Take 1 tablet (40 mg total) by mouth daily before breakfast. 90 tablet 3  . Pitavastatin Calcium (LIVALO) 4 MG TABS Take 1 tablet (4 mg total) by mouth every other day. 21 tablet 0  . [DISCONTINUED] calcium citrate-vitamin D 200-200 MG-UNIT TABS Take 1 tablet by mouth daily.       No current facility-administered medications on file prior to visit.    Allergies   Allergen Reactions  . Statins Other (See Comments)    'discomfort, aching everywhere'  . Zetia [Ezetimibe] Other (See Comments)    Leg pain  . Zocor [Simvastatin] Other (See Comments)    Leg pain   Family History  Problem Relation Age of Onset  . Hypertension Father   . Hip fracture Father   . COPD Mother   . Cancer Maternal Aunt   . Diabetes Maternal Aunt   . Diabetes Maternal Uncle     PE: BP (!) 148/80 (BP Location: Left Arm, Patient Position: Sitting)   Pulse 66   Wt 178 lb (80.7 kg)   SpO2 97%   BMI 31.53 kg/m  Wt Readings from Last 3 Encounters:  04/12/17 178 lb (80.7 kg)  02/22/17 171 lb 14.4 oz (78 kg)  01/27/17 173 lb (78.5 kg)   Constitutional: overweight, in NAD Eyes: PERRLA, EOMI, no exophthalmos ENT: moist mucous membranes, no thyromegaly, no cervical lymphadenopathy Cardiovascular: RRR, No MRG Respiratory: CTA B Gastrointestinal: abdomen soft, NT, ND, BS+ Musculoskeletal: no deformities, strength intact in all 4 Skin: moist, warm, no rashes Neurological: no tremor with outstretched hands, DTR normal in all 4  ASSESSMENT: 1. DM2, non-insulin-dependent, uncontrolled, with complications - CKD - PN  2.  Peripheral neuropathy secondary to diabetes  3. Hypercalcemia  PLAN:  1. Patient with 3-year history of diabetes, previously well controlled on metformin, however, she developed diarrhea in her 30-year on metformin >> she stopped the med 2 months ago.  She was started on glyburide, however, this is causing her to have low blood sugars after breakfast.  She feels dizzy and tremulous and would like to stop it.  She also feels that it caused her to gain weight.  At this visit, we will stop glyburide and we discussed about the fact that we are limited in our options due to the fact that she is a Medicare patient also because of her  kidney disease.  I suggested to try metformin extended release first and if she cannot tolerate this, to try a low dose of  glipizide extended release.  We would ideally use a DPP 4 inhibitor, but this was very expensive when she tried to get it 2 months ago.  She agrees with the plan - I suggested to:  Patient Instructions  Please stop Glyburide.  Please start Metformin ER 500 mg with dinner. In 4 days, you can increase to 500 mg 2x a day with meals.   Please stop Neurontin.  Can try the following combination for neuropathy: - alpha-lipoic acid 600 mg 2x a day - B complex 1 tablet 1x a day  Please return in ~2 months with your sugar log.   - Strongly advised her to start checking sugars at different times of the day - check 2 times a day, rotating checks - given sugar log and advised how to fill it and to bring it at next appt  - given foot care handout and explained the principles  - given instructions for hypoglycemia management "15-15 rule"  - advised for yearly eye exams  - had flu shot this season - Return to clinic in 2 mo with sugar log   2.  Peripheral neuropathy secondary to diabetes - She restarted the Neurontin recently but she does not feel that she needs it every night.  She is not sure with her weight gain is from Neurontin.  We discussed about trying to stop it and start the natural supplement for neuropathy.  I suggested alpha lipoic acid and B complex.  3. Hypercalcemia - mild - latest Ca levels mostly normal - + suppressed PTH and normal vit D at latest checks - likely was 2/2 HCTZ as it resolved after stopping HCTZ - per PCP  - time spent with the patient: 45 minutes, of which >50% was spent in obtaining information about her symptoms, reviewing her previous labs, evaluations, and treatments, counseling her about her conditions (please see the discussed topics above), and developing a plan to further treat them; she had a number of questions which I addressed.   Philemon Kingdom, MD PhD [facility]

## 2017-04-12 NOTE — Patient Instructions (Addendum)
Please stop Glyburide.  Please start Metformin ER 500 mg with dinner. In 4 days, you can increase to 500 mg 2x a day with meals.   Please stop Neurontin.  Can try the following combination for neuropathy: - alpha-lipoic acid 600 mg 2x a day - B complex 1 tablet 1x a day  Please return in ~2 months with your sugar log.   PATIENT INSTRUCTIONS FOR TYPE 2 DIABETES:  DIET AND EXERCISE Diet and exercise is an important part of diabetic treatment.  We recommended aerobic exercise in the form of brisk walking (working between 40-60% of maximal aerobic capacity, similar to brisk walking) for 150 minutes per week (such as 30 minutes five days per week) along with 3 times per week performing 'resistance' training (using various gauge rubber tubes with handles) 5-10 exercises involving the major muscle groups (upper body, lower body and core) performing 10-15 repetitions (or near fatigue) each exercise. Start at half the above goal but build slowly to reach the above goals. If limited by weight, joint pain, or disability, we recommend daily walking in a swimming pool with water up to waist to reduce pressure from joints while allow for adequate exercise.    BLOOD GLUCOSES Monitoring your blood glucoses is important for continued management of your diabetes. Please check your blood glucoses 2-4 times a day: fasting, before meals and at bedtime (you can rotate these measurements - e.g. one day check before the 3 meals, the next day check before 2 of the meals and before bedtime, etc.).   HYPOGLYCEMIA (low blood sugar) Hypoglycemia is usually a reaction to not eating, exercising, or taking too much insulin/ other diabetes drugs.  Symptoms include tremors, sweating, hunger, confusion, headache, etc. Treat IMMEDIATELY with 15 grams of Carbs: . 4 glucose tablets .  cup regular juice/soda . 2 tablespoons raisins . 4 teaspoons sugar . 1 tablespoon honey Recheck blood glucose in 15 mins and repeat above if  still symptomatic/blood glucose <100.  RECOMMENDATIONS TO REDUCE YOUR RISK OF DIABETIC COMPLICATIONS: * Take your prescribed MEDICATION(S) * Follow a DIABETIC diet: Complex carbs, fiber rich foods, (monounsaturated and polyunsaturated) fats * AVOID saturated/trans fats, high fat foods, >2,300 mg salt per day. * EXERCISE at least 5 times a week for 30 minutes or preferably daily.  * DO NOT SMOKE OR DRINK more than 1 drink a day. * Check your FEET every day. Do not wear tightfitting shoes. Contact us if you develop an ulcer * See your EYE doctor once a year or more if needed * Get a FLU shot once a year * Get a PNEUMONIA vaccine once before and once after age 24 years  GOALS:  * Your Hemoglobin A1c of <7%  * fasting sugars need to be <130 * after meals sugars need to be <180 (2h after you start eating) * Your Systolic BP should be 161 or lower  * Your Diastolic BP should be 80 or lower  * Your HDL (Good Cholesterol) should be 40 or higher  * Your LDL (Bad Cholesterol) should be 100 or lower. * Your Triglycerides should be 150 or lower  * Your Urine microalbumin (kidney function) should be <30 * Your Body Mass Index should be 25 or lower    Please consider the following ways to cut down carbs and fat and increase fiber and micronutrients in your diet: - substitute whole grain for white bread or pasta - substitute brown rice for white rice - substitute 90-calorie flat bread pieces for  slices of bread when possible - substitute sweet potatoes or yams for white potatoes - substitute humus for margarine - substitute tofu for cheese when possible - substitute almond or rice milk for regular milk (would not drink soy milk daily due to concern for soy estrogen influence on breast cancer risk) - substitute dark chocolate for other sweets when possible - substitute water - can add lemon or orange slices for taste - for diet sodas (artificial sweeteners will trick your body that you can eat  sweets without getting calories and will lead you to overeating and weight gain in the long run) - do not skip breakfast or other meals (this will slow down the metabolism and will result in more weight gain over time)  - can try smoothies made from fruit and almond/rice milk in am instead of regular breakfast - can also try old-fashioned (not instant) oatmeal made with almond/rice milk in am - order the dressing on the side when eating salad at a restaurant (pour less than half of the dressing on the salad) - eat as little meat as possible - can try juicing, but should not forget that juicing will get rid of the fiber, so would alternate with eating raw veg./fruits or drinking smoothies - use as little oil as possible, even when using olive oil - can dress a salad with a mix of balsamic vinegar and lemon juice, for e.g. - use agave nectar, stevia sugar, or regular sugar rather than artificial sweateners - steam or broil/roast veggies  - snack on veggies/fruit/nuts (unsalted, preferably) when possible, rather than processed foods - reduce or eliminate aspartame in diet (it is in diet sodas, chewing gum, etc) Read the labels!  Try to read Dr. Janene Harvey book: "Program for Reversing Diabetes" for other ideas for healthy eating.

## 2017-05-01 ENCOUNTER — Encounter (INDEPENDENT_AMBULATORY_CARE_PROVIDER_SITE_OTHER): Payer: Self-pay | Admitting: *Deleted

## 2017-05-02 ENCOUNTER — Ambulatory Visit (INDEPENDENT_AMBULATORY_CARE_PROVIDER_SITE_OTHER): Payer: Medicare Other | Admitting: Internal Medicine

## 2017-05-09 ENCOUNTER — Ambulatory Visit (INDEPENDENT_AMBULATORY_CARE_PROVIDER_SITE_OTHER): Payer: Medicare Other | Admitting: Internal Medicine

## 2017-05-09 ENCOUNTER — Telehealth (INDEPENDENT_AMBULATORY_CARE_PROVIDER_SITE_OTHER): Payer: Self-pay | Admitting: *Deleted

## 2017-05-09 ENCOUNTER — Encounter (INDEPENDENT_AMBULATORY_CARE_PROVIDER_SITE_OTHER): Payer: Self-pay | Admitting: *Deleted

## 2017-05-09 ENCOUNTER — Encounter (INDEPENDENT_AMBULATORY_CARE_PROVIDER_SITE_OTHER): Payer: Self-pay | Admitting: Internal Medicine

## 2017-05-09 VITALS — BP 142/90 | HR 72 | Temp 97.8°F | Ht 63.0 in | Wt 176.5 lb

## 2017-05-09 DIAGNOSIS — Z86010 Personal history of colonic polyps: Secondary | ICD-10-CM | POA: Diagnosis not present

## 2017-05-09 DIAGNOSIS — R197 Diarrhea, unspecified: Secondary | ICD-10-CM

## 2017-05-09 MED ORDER — PEG 3350-KCL-NA BICARB-NACL 420 G PO SOLR: 4000.0000 mL | Freq: Once | ORAL | 0 refills | Status: AC

## 2017-05-09 NOTE — Telephone Encounter (Signed)
Patient needs trilyte 

## 2017-05-09 NOTE — Progress Notes (Signed)
Subjective:    Patient ID: Lisa Sutton, female    DOB: 1933-07-23, 83 y.o.   MRN: [id number]   HPI Presents today with c/o diarrhea. Last seen in October by Dr. Laural Golden for GERD. She had no GI complaints. She did have some diarrhea.  Has had diarrhea since x 2 months off and on.  She did not have melena or BRRB. Her last colonoscopy was in 2010 per DR. Rehman's records.  She has had diarrhea off and on since October. She states it is not everyday. She has diarrhea in the morning about 90% of the time. Stools will be loose, but not watery. No recent antibiotics.  She would like to proceed with a colonoscopy. Her last colonoscopy was in 2010 with multiple diverticula at sigmoid colon. Hx of colon polyps.5 Her appetite is okay. She has gained 5 pounds since her last visit. Saw Dr. Laurance Flatten in [place] in September and stool was guaiac negative.  Has been on Metformin for 3-4 yrs.  Review of Systems Past Medical History:  Diagnosis Date  . Anxiety   . Arthritis   . Cataract   . Dysphagia    'sometimes but not a major issue' been checked out by GI (per pt)  . Dysrhythmia    'heart used to skip but doesn't anymore' was checked out by Dr. Ron Parker late '90s, everything checked out ok and not had any skipping since (all per pt)  . Essential hypertension, benign   . Fatty liver   . GERD (gastroesophageal reflux disease)   . Hepatitis B    had at age 79, 'GI doc said it's gone away'  . Hepatitis B surface antigen positive   . History of hiatal hernia   . Hypercalcemia   . Hypothyroidism   . Kidney stone August 2014   Patient [facility] at [facility]  . Obesity   . Osteoporosis   . PONV (postoperative nausea and vomiting)   . Type 2 diabetes mellitus ([place])     Past Surgical History:  Procedure Laterality Date  . ABDOMINAL HYSTERECTOMY    . BILATERAL CARPAL TUNNEL RELEASE     Dr. Marlou Sa at surgical center  . CATARACT EXTRACTION, BILATERAL    . Deviated septum repair    .  ESOPHAGOGASTRODUODENOSCOPY N/A 07/28/2016   Procedure: ESOPHAGOGASTRODUODENOSCOPY (EGD);  Surgeon: Rogene Houston, MD;  [facility];  Service: Endoscopy;  Laterality: N/A;  300 - moved to 3/7 @ 1:00  . EYE SURGERY    . KNEE ARTHROPLASTY Left 09/15/2015   Procedure: COMPUTER ASSISTED TOTAL KNEE ARTHROPLASTY;  Surgeon: Marybelle Killings, MD;  Location: [place];  Service: Orthopedics;  Laterality: Left;  . KNEE ARTHROSCOPY     left knee 2003    Allergies  Allergen Reactions  . Statins Other (See Comments)    'discomfort, aching everywhere'  . Zetia [Ezetimibe] Other (See Comments)    Leg pain  . Zocor [Simvastatin] Other (See Comments)    Leg pain    Current Outpatient Medications on File Prior to Visit  Medication Sig Dispense Refill  . ACCU-CHEK AVIVA PLUS test strip CHECK BLOOD SUGAR TWO TIMES DAILY 200 each 1  . acetaminophen (TYLENOL) 325 MG tablet Take 650 mg by mouth every 6 (six) hours as needed.    . ALPRAZolam (XANAX) 0.25 MG tablet Take 1 tablet (0.25 mg total) by mouth 2 (two) times daily as needed for anxiety. 180 tablet 1  . amLODipine (NORVASC) 5 MG tablet Take  1 tablet (5 mg total) by mouth daily. 90 tablet 1  . aspirin 81 MG chewable tablet Chew 81 mg by mouth every evening.     Marland Kitchen atenolol (TENORMIN) 50 MG tablet Take 1 tablet (50 mg total) by mouth daily. 90 tablet 3  . Cholecalciferol (VITAMIN D3) 1000 units CAPS Take 1 capsule by mouth daily.    . irbesartan (AVAPRO) 150 MG tablet Take 1 tablet (150 mg total) by mouth daily. 90 tablet 3  . levothyroxine (SYNTHROID, LEVOTHROID) 112 MCG tablet TAKE 1 TABLET BY MOUTH  DAILY 90 tablet 2  . levothyroxine (SYNTHROID, LEVOTHROID) 25 MCG tablet TAKE 1 TABLET BY MOUTH  DAILY BEFORE BREAKFAST (Patient taking differently: Take 43mcg on Mondays,Wednesday and Fridays with the 112mg  levothyroxine) 90 tablet 2  . loperamide (IMODIUM) 2 MG capsule Take by mouth as needed for diarrhea or loose stools.    . metFORMIN (GLUCOPHAGE-XR)  500 MG 24 hr tablet Take 1 tablet (500 mg total) by mouth 2 (two) times daily with a meal. 60 tablet 1  . Multiple Vitamin (MULTIVITAMIN WITH MINERALS) TABS tablet Take 1 tablet by mouth daily.    . pantoprazole (PROTONIX) 40 MG tablet Take 1 tablet (40 mg total) by mouth daily before breakfast. (Patient taking differently: Take 40 mg by mouth as needed. ) 90 tablet 3  . Pitavastatin Calcium (LIVALO) 2 MG TABS Take by mouth.    . Pitavastatin Calcium (LIVALO) 4 MG TABS Take 1 tablet (4 mg total) by mouth every other day. 21 tablet 0  . diphenoxylate-atropine (LOMOTIL) 2.5-0.025 MG tablet Take 2 tabs now and 1 every 4 hours PRN thereafter (Patient not taking: Reported on 05/09/2017) 30 tablet 0  . [DISCONTINUED] calcium citrate-vitamin D 200-200 MG-UNIT TABS Take 1 tablet by mouth daily.       No current facility-administered medications on file prior to visit.         Objective:   Physical Exam Blood pressure (!) 142/90, pulse 72, temperature 97.8 F (36.6 C), height 5\' 3"  (1.6 m), weight 176 lb 8 oz (80.1 kg). Alert and oriented. Skin warm and dry. Oral mucosa is moist.   . Sclera anicteric, conjunctivae is pink. Thyroid not enlarged. No cervical lymphadenopathy. Lungs clear. Heart regular rate and rhythm.  Abdomen is soft. Bowel sounds are positive. No hepatomegaly. No abdominal masses felt. No tenderness.  No edema to lower extremities.           Assessment & Plan:  Surveillance colonoscopy. Diarrhea. Take Imodium one a day. May try a probiotic.

## 2017-05-09 NOTE — Patient Instructions (Signed)
Colonoscopy. The risks of bleeding, perforation and infection were reviewed with patient.  

## 2017-05-10 ENCOUNTER — Encounter: Payer: Self-pay | Admitting: Internal Medicine

## 2017-05-26 ENCOUNTER — Encounter (HOSPITAL_COMMUNITY): Payer: Self-pay | Admitting: *Deleted

## 2017-05-26 ENCOUNTER — Ambulatory Visit (HOSPITAL_COMMUNITY)
Admission: RE | Admit: 2017-05-26 | Discharge: 2017-05-26 | Disposition: A | Discharge status: Home / Self Care | Payer: Medicare Other | Source: Ambulatory Visit | Attending: Internal Medicine | Admitting: Internal Medicine

## 2017-05-26 ENCOUNTER — Encounter (HOSPITAL_COMMUNITY)
Admission: RE | Disposition: A | Discharge status: Home / Self Care | Payer: Self-pay | Source: Ambulatory Visit | Attending: Internal Medicine

## 2017-05-26 ENCOUNTER — Other Ambulatory Visit: Payer: Self-pay

## 2017-05-26 DIAGNOSIS — K644 Residual hemorrhoidal skin tags: Secondary | ICD-10-CM | POA: Insufficient documentation

## 2017-05-26 DIAGNOSIS — Z7984 Long term (current) use of oral hypoglycemic drugs: Secondary | ICD-10-CM | POA: Diagnosis not present

## 2017-05-26 DIAGNOSIS — R197 Diarrhea, unspecified: Secondary | ICD-10-CM

## 2017-05-26 DIAGNOSIS — E039 Hypothyroidism, unspecified: Secondary | ICD-10-CM | POA: Insufficient documentation

## 2017-05-26 DIAGNOSIS — Z6831 Body mass index (BMI) 31.0-31.9, adult: Secondary | ICD-10-CM | POA: Diagnosis not present

## 2017-05-26 DIAGNOSIS — Z86010 Personal history of colonic polyps: Secondary | ICD-10-CM | POA: Insufficient documentation

## 2017-05-26 DIAGNOSIS — K760 Fatty (change of) liver, not elsewhere classified: Secondary | ICD-10-CM | POA: Diagnosis not present

## 2017-05-26 DIAGNOSIS — Z888 Allergy status to other drugs, medicaments and biological substances status: Secondary | ICD-10-CM | POA: Diagnosis not present

## 2017-05-26 DIAGNOSIS — K219 Gastro-esophageal reflux disease without esophagitis: Secondary | ICD-10-CM | POA: Diagnosis not present

## 2017-05-26 DIAGNOSIS — F419 Anxiety disorder, unspecified: Secondary | ICD-10-CM | POA: Diagnosis not present

## 2017-05-26 DIAGNOSIS — K5730 Diverticulosis of large intestine without perforation or abscess without bleeding: Secondary | ICD-10-CM | POA: Insufficient documentation

## 2017-05-26 DIAGNOSIS — I10 Essential (primary) hypertension: Secondary | ICD-10-CM | POA: Insufficient documentation

## 2017-05-26 DIAGNOSIS — Z87442 Personal history of urinary calculi: Secondary | ICD-10-CM | POA: Diagnosis not present

## 2017-05-26 DIAGNOSIS — E669 Obesity, unspecified: Secondary | ICD-10-CM | POA: Diagnosis not present

## 2017-05-26 DIAGNOSIS — Q438 Other specified congenital malformations of intestine: Secondary | ICD-10-CM | POA: Diagnosis not present

## 2017-05-26 DIAGNOSIS — E119 Type 2 diabetes mellitus without complications: Secondary | ICD-10-CM | POA: Diagnosis not present

## 2017-05-26 DIAGNOSIS — M810 Age-related osteoporosis without current pathological fracture: Secondary | ICD-10-CM | POA: Diagnosis not present

## 2017-05-26 DIAGNOSIS — M1990 Unspecified osteoarthritis, unspecified site: Secondary | ICD-10-CM | POA: Diagnosis not present

## 2017-05-26 DIAGNOSIS — Z79899 Other long term (current) drug therapy: Secondary | ICD-10-CM | POA: Insufficient documentation

## 2017-05-26 DIAGNOSIS — K599 Functional intestinal disorder, unspecified: Secondary | ICD-10-CM | POA: Diagnosis not present

## 2017-05-26 DIAGNOSIS — Z8619 Personal history of other infectious and parasitic diseases: Secondary | ICD-10-CM | POA: Insufficient documentation

## 2017-05-26 HISTORY — PX: BIOPSY: SHX5522

## 2017-05-26 HISTORY — PX: COLONOSCOPY: SHX5424

## 2017-05-26 LAB — GLUCOSE, CAPILLARY: GLUCOSE-CAPILLARY: 132 mg/dL — AB (ref 65–99)

## 2017-05-26 SURGERY — COLONOSCOPY
Anesthesia: Moderate Sedation

## 2017-05-26 MED ORDER — MEPERIDINE HCL 50 MG/ML IJ SOLN
INTRAMUSCULAR | Status: AC
  Filled 2017-05-26: qty 1

## 2017-05-26 MED ORDER — MIDAZOLAM HCL 5 MG/5ML IJ SOLN
INTRAMUSCULAR | Status: AC
  Filled 2017-05-26: qty 10

## 2017-05-26 MED ORDER — MIDAZOLAM HCL 5 MG/5ML IJ SOLN
INTRAMUSCULAR | Status: DC | PRN
  Administered 2017-05-26 (×2): 1 mg via INTRAVENOUS
  Administered 2017-05-26: 2 mg via INTRAVENOUS
  Administered 2017-05-26 (×2): 1 mg via INTRAVENOUS

## 2017-05-26 MED ORDER — MEPERIDINE HCL 50 MG/ML IJ SOLN
INTRAMUSCULAR | Status: DC | PRN
  Administered 2017-05-26 (×2): 25 mg via INTRAVENOUS

## 2017-05-26 MED ORDER — SODIUM CHLORIDE 0.9 % IV SOLN
INTRAVENOUS | Status: DC
  Administered 2017-05-26: 10:00:00 via INTRAVENOUS

## 2017-05-26 MED ORDER — STERILE WATER FOR IRRIGATION IR SOLN
Status: DC | PRN
  Administered 2017-05-26: 10:00:00

## 2017-05-26 NOTE — Discharge Instructions (Signed)
Resume aspirin on 05/27/2017. Resume other medications as before. High fiber diet. No driving for 24 hours. Physician will call with biopsy results.   Colonoscopy, Adult, Care After This sheet gives you information about how to care for yourself after your procedure. Your health care provider may also give you more specific instructions. If you have problems or questions, contact your health care provider. What can I expect after the procedure? After the procedure, it is common to have:  A small amount of blood in your stool for 24 hours after the procedure.  Some gas.  Mild abdominal cramping or bloating.  Follow these instructions at home: General instructions   For the first 24 hours after the procedure: ? Do not drive or use machinery. ? Do not sign important documents. ? Do not drink alcohol. ? Do your regular daily activities at a slower pace than normal. ? Eat soft, easy-to-digest foods. ? Rest often.  Take over-the-counter or prescription medicines only as told by your health care provider.  It is up to you to get the results of your procedure. Ask your health care provider, or the department performing the procedure, when your results will be ready. Relieving cramping and bloating  Try walking around when you have cramps or feel bloated.  Apply heat to your abdomen as told by your health care provider. Use a heat source that your health care provider recommends, such as a moist heat pack or a heating pad. ? Place a towel between your skin and the heat source. ? Leave the heat on for 20-30 minutes. ? Remove the heat if your skin turns bright red. This is especially important if you are unable to feel pain, heat, or cold. You may have a greater risk of getting burned. Eating and drinking  Drink enough fluid to keep your urine clear or pale yellow.  Resume your normal diet as instructed by your health care provider. Avoid heavy or fried foods that are hard to  digest.  Avoid drinking alcohol for as long as instructed by your health care provider. Contact a health care provider if:  You have blood in your stool 2-3 days after the procedure. Get help right away if:  You have more than a small spotting of blood in your stool.  You pass large blood clots in your stool.  Your abdomen is swollen.  You have nausea or vomiting.  You have a fever.  You have increasing abdominal pain that is not relieved with medicine. This information is not intended to replace advice given to you by your health care provider. Make sure you discuss any questions you have with your health care provider. Document Released: 12/23/2003 Document Revised: 02/02/2016 Document Reviewed: 07/22/2015 Elsevier Interactive Patient Education  2018 [employer].   High-Fiber Diet Fiber, also called dietary fiber, is a type of carbohydrate found in fruits, vegetables, whole grains, and beans. A high-fiber diet can have many health benefits. Your health care provider may recommend a high-fiber diet to help:  Prevent constipation. Fiber can make your bowel movements more regular.  Lower your cholesterol.  Relieve hemorrhoids, uncomplicated diverticulosis, or irritable bowel syndrome.  Prevent overeating as part of a weight-loss plan.  Prevent heart disease, type 2 diabetes, and certain cancers.  What is my plan? The recommended daily intake of fiber includes:  38 grams for men under age 71.  15 grams for men over age 74.  25 grams for women under age 50.  65 grams for women  over age 27.  You can get the recommended daily intake of dietary fiber by eating a variety of fruits, vegetables, grains, and beans. Your health care provider may also recommend a fiber supplement if it is not possible to get enough fiber through your diet. What do I need to know about a high-fiber diet?  Fiber supplements have not been widely studied for their effectiveness, so it is better  to get fiber through food sources.  Always check the fiber content on thenutrition facts label of any prepackaged food. Look for foods that contain at least 5 grams of fiber per serving.  Ask your dietitian if you have questions about specific foods that are related to your condition, especially if those foods are not listed in the following section.  Increase your daily fiber consumption gradually. Increasing your intake of dietary fiber too quickly may cause bloating, cramping, or gas.  Drink plenty of water. Water helps you to digest fiber. What foods can I eat? Grains Whole-grain breads. Multigrain cereal. Oats and oatmeal. Brown rice. Barley. Bulgur wheat. [place]. Bran muffins. Popcorn. Rye wafer crackers. Vegetables Sweet potatoes. Spinach. Kale. Artichokes. Cabbage. Broccoli. Green peas. Carrots. Squash. Fruits Berries. Pears. Apples. Oranges. Avocados. Prunes and raisins. Dried figs. Meats and Other Protein Sources [place], kidney, pinto, and soy beans. Split peas. Lentils. Nuts and seeds. Dairy Fiber-fortified yogurt. Beverages Fiber-fortified soy milk. Fiber-fortified orange juice. Other Fiber bars. The items listed above may not be a complete list of recommended foods or beverages. Contact your dietitian for more options. What foods are not recommended? Grains White bread. Pasta made with refined flour. White rice. Vegetables Fried potatoes. Canned vegetables. Well-cooked vegetables. Fruits Fruit juice. Cooked, strained fruit. Meats and Other Protein Sources Fatty cuts of meat. Fried Sales executive or fried fish. Dairy Milk. Yogurt. Cream cheese. Sour cream. Beverages Soft drinks. Other Cakes and pastries. Butter and oils. The items listed above may not be a complete list of foods and beverages to avoid. Contact your dietitian for more information. What are some tips for including high-fiber foods in my diet?  Eat a wide variety of high-fiber foods.  Make sure that half  of all grains consumed each day are whole grains.  Replace breads and cereals made from refined flour or white flour with whole-grain breads and cereals.  Replace white rice with brown rice, bulgur wheat, or millet.  Start the day with a breakfast that is high in fiber, such as a cereal that contains at least 5 grams of fiber per serving.  Use beans in place of meat in soups, salads, or pasta.  Eat high-fiber snacks, such as berries, raw vegetables, nuts, or popcorn. This information is not intended to replace advice given to you by your health care provider. Make sure you discuss any questions you have with your health care provider. Document Released: 05/10/2005 Document Revised: 10/16/2015 Document Reviewed: 10/23/2013 Elsevier Interactive Patient Education  [employer].

## 2017-05-26 NOTE — H&P (Signed)
Lisa Sutton is an 84 y.o. female.   Chief Complaint: Patient is here for colonoscopy. HPI: Patient is 84 year old Caucasian female who has a history of colonic adenomas last colonoscopy was about 9 years ago.  She has been having nonbloody diarrhea for several weeks.  She has taken OTC medication with transient relief.  She denies melena or rectal bleeding abdominal pain or weight loss. Family history is negative for CRC.  Past Medical History:  Diagnosis Date  . Anxiety   . Arthritis   . Cataract   . Dysphagia    'sometimes but not a major issue' been checked out by GI (per pt)  . Dysrhythmia    'heart used to skip but doesn't anymore' was checked out by Dr. Ron Parker late '90s, everything checked out ok and not had any skipping since (all per pt)  . Essential hypertension, benign   . Fatty liver   . GERD (gastroesophageal reflux disease)   . Hepatitis B    had at age 44, 'GI doc said it's gone away'  . Hepatitis B surface antigen positive   . History of hiatal hernia   . Hypercalcemia   . Hypothyroidism   . Kidney stone August 2014   Patient was [facility] at [facility]  . Obesity   . Osteoporosis   . PONV (postoperative nausea and vomiting)   . Type 2 diabetes mellitus ([place])     Past Surgical History:  Procedure Laterality Date  . ABDOMINAL HYSTERECTOMY    . BILATERAL CARPAL TUNNEL RELEASE     Dr. Marlou Sa at surgical center  . CATARACT EXTRACTION, BILATERAL    . Deviated septum repair    . ESOPHAGOGASTRODUODENOSCOPY N/A 07/28/2016   Procedure: ESOPHAGOGASTRODUODENOSCOPY (EGD);  Surgeon: Rogene Houston, MD;  Location: [facility];  Service: Endoscopy;  Laterality: N/A;  300 - moved to 3/7 @ 1:00  . EYE SURGERY    . KNEE ARTHROPLASTY Left 09/15/2015   Procedure: COMPUTER ASSISTED TOTAL KNEE ARTHROPLASTY;  Surgeon: Marybelle Killings, MD;  Location: [facility];  Service: Orthopedics;  Laterality: Left;  . KNEE ARTHROSCOPY     left knee 2003    Family History  Problem Relation Age of Onset   . Hypertension Father   . Hip fracture Father   . COPD Mother   . Cancer Maternal Aunt   . Diabetes Maternal Aunt   . Diabetes Maternal Uncle   . Colon cancer Neg Hx    Social History:  reports that  has never smoked. she has never used smokeless tobacco. She reports that she does not drink alcohol or use drugs.  Allergies:  Allergies  Allergen Reactions  . Statins Other (See Comments)    'discomfort, aching everywhere' Tolerates to livalo   . Zetia [Ezetimibe] Other (See Comments)    Leg pain  . Zocor [Simvastatin] Other (See Comments)    Leg pain    Medications Prior to Admission  Medication Sig Dispense Refill  . ACCU-CHEK AVIVA PLUS test strip CHECK BLOOD SUGAR TWO TIMES DAILY 200 each 1  . acetaminophen (TYLENOL) 500 MG tablet Take 1,000 mg by mouth daily as needed for moderate pain or headache.    . Alpha-Lipoic Acid 600 MG CAPS Take 600 mg by mouth 2 (two) times daily.    Marland Kitchen ALPRAZolam (XANAX) 0.25 MG tablet Take 1 tablet (0.25 mg total) by mouth 2 (two) times daily as needed for anxiety. (Patient taking differently: Take 0.25 mg by mouth at bedtime as needed for  sleep. ) 180 tablet 1  . amLODipine (NORVASC) 5 MG tablet Take 1 tablet (5 mg total) by mouth daily. (Patient taking differently: Take 5 mg by mouth every evening. ) 90 tablet 1  . aspirin EC 81 MG tablet Take 81 mg by mouth every evening.    Marland Kitchen atenolol (TENORMIN) 50 MG tablet Take 1 tablet (50 mg total) by mouth daily. 90 tablet 3  . irbesartan (AVAPRO) 150 MG tablet Take 1 tablet (150 mg total) by mouth daily. (Patient taking differently: Take 150 mg by mouth every evening. ) 90 tablet 3  . levothyroxine (SYNTHROID, LEVOTHROID) 112 MCG tablet TAKE 1 TABLET BY MOUTH  DAILY 90 tablet 2  . levothyroxine (SYNTHROID, LEVOTHROID) 25 MCG tablet TAKE 1 TABLET BY MOUTH  DAILY BEFORE BREAKFAST (Patient taking differently: Take 48mcg on Mondays,Wednesday and Fridays with the 112mg  levothyroxine) 90 tablet 2  . loperamide  (IMODIUM) 2 MG capsule Take 2 mg by mouth daily.     . metFORMIN (GLUCOPHAGE-XR) 500 MG 24 hr tablet Take 1 tablet (500 mg total) by mouth 2 (two) times daily with a meal. 60 tablet 1  . Multiple Vitamin (MULTIVITAMIN WITH MINERALS) TABS tablet Take 1 tablet by mouth daily.    Marland Kitchen OVER THE COUNTER MEDICATION Apply 1 application topically daily as needed (foot pain). Magnilife otc pain reliever cream    . pantoprazole (PROTONIX) 40 MG tablet Take 1 tablet (40 mg total) by mouth daily before breakfast. (Patient taking differently: Take 40 mg by mouth as needed (acid reflux). ) 90 tablet 3  . Pitavastatin Calcium (LIVALO) 4 MG TABS Take 1 tablet (4 mg total) by mouth every other day. (Patient taking differently: Take 2 mg by mouth daily. ) 21 tablet 0  . Cholecalciferol (VITAMIN D3) 1000 units CAPS Take 1,000 Units by mouth daily.     . diphenoxylate-atropine (LOMOTIL) 2.5-0.025 MG tablet Take 2 tabs now and 1 every 4 hours PRN thereafter (Patient not taking: Reported on 05/09/2017) 30 tablet 0    Results for orders placed or performed during the hospital encounter of 05/26/17 (from the past 48 hour(s))  Glucose, capillary     Status: Abnormal   Collection Time: 05/26/17  9:34 AM  Result Value Ref Range   Glucose-Capillary 132 (H) 65 - 99 mg/dL   No results found.  ROS  Blood pressure (!) 158/79, pulse 62, temperature 97.6 F (36.4 C), temperature source Oral, resp. rate 18, height 5\' 3"  (1.6 m), weight 176 lb (79.8 kg), SpO2 98 %. Physical Exam  Constitutional: She appears well-developed and well-nourished.  HENT:  Mouth/Throat: Oropharynx is clear and moist.  Eyes: Conjunctivae are normal. No scleral icterus.  Neck: No thyromegaly present.  Cardiovascular: Normal rate, regular rhythm and normal heart sounds.  No murmur heard. Respiratory: Effort normal and breath sounds normal.  GI: Soft. Bowel sounds are normal. She exhibits no distension and no mass. There is no tenderness.   Pfannenstiel scar.  Musculoskeletal: She exhibits no edema.  Lymphadenopathy:    She has no cervical adenopathy.  Neurological: She is alert.  Skin: Skin is warm and dry.     Assessment/Plan Unexplained diarrhea. History of colonic adenoma. Diagnostic colonoscopy.  [name], MD 05/26/2017, 10:20 AM

## 2017-05-26 NOTE — Op Note (Signed)
[facility] Patient Name: Lisa Sutton Procedure Date: 05/26/2017 9:46 AM MRN: [id number] Date of Birth: [birth year] Attending MD: [name] , MD CSN: [id number] Age: 84 Admit Type: Outpatient Procedure:                Colonoscopy Indications:              Clinically significant diarrhea of unexplained                            origin, History of colonic adenoma Providers:                [name], MD Referring MD:             [name], MD Medicines:                Meperidine 50 mg IV, Midazolam 6 mg IV Complications:            No immediate complications. Estimated Blood Loss:     Estimated blood loss was minimal. Procedure:                Pre-Anesthesia Assessment:                           - Prior to the procedure, a History and Physical                            was performed, and patient medications and                            allergies were reviewed. The patient's tolerance of                            previous anesthesia was also reviewed. The risks                            and benefits of the procedure and the sedation                            options and risks were discussed with the patient.                            All questions were answered, and informed consent                            was obtained. Prior Anticoagulants: The patient                            last took aspirin 3 days prior to the procedure.                            ASA Grade Assessment: II - A patient with mild                            systemic disease. After reviewing the risks and  benefits, the patient was deemed in satisfactory                            condition to undergo the procedure.                           After obtaining informed consent, the colonoscope                            was passed under direct vision. Throughout the                            procedure, the patient's blood pressure, pulse, and        oxygen saturations were monitored continuously. The                            EC-[id number] ([id number]) scope was introduced through                            the anus and advanced to the the cecum, identified                            by appendiceal orifice and ileocecal valve. The                            colonoscopy was somewhat difficult due to                            restricted mobility of the colon. Successful                            completion of the procedure was aided by                            withdrawing the scope and replacing with the                            'babyscope'. The patient tolerated the procedure                            well. The quality of the bowel preparation was                            adequate. The ileocecal valve, appendiceal orifice,                            and rectum were photographed. Scope In: 10:32:58 AM Scope Out: 10:54:56 AM Scope Withdrawal Time: 0 hours 8 minutes 9 seconds  Total Procedure Duration: 0 hours 21 minutes 58 seconds  Findings:      The perianal and digital rectal examinations were normal.      Multiple medium-mouthed diverticula were found in the sigmoid colon.      The exam was otherwise normal throughout the examined colon.      Biopsies for histology were  taken with a cold forceps from the sigmoid       colon for evaluation of microscopic colitis.      External hemorrhoids were found during retroflexion. The hemorrhoids       were small. Impression:               - Diverticulosis in the sigmoid colon.                           - External hemorrhoids.                           - Biopsies were taken with a cold forceps from the                            sigmoid colon for evaluation of microscopic colitis. Moderate Sedation:      Moderate (conscious) sedation was administered by the endoscopy nurse       and supervised by the endoscopist. The following parameters were       monitored: oxygen saturation, heart  rate, blood pressure, CO2       capnography and response to care. Total physician intraservice time was       27 minutes. Recommendation:           - Patient has a contact number available for                            emergencies. The signs and symptoms of potential                            delayed complications were discussed with the                            patient. Return to normal activities tomorrow.                            Written discharge instructions were provided to the                            patient.                           - High fiber diet today.                           - Continue present medications.                           - No aspirin, ibuprofen, naproxen, or other                            non-steroidal anti-inflammatory drugs for 1 day.                           - Await pathology results.                           - No repeat colonoscopy due  to age and the absence                            of advanced adenomas. Procedure Code(s):        --- Professional ---                           [phone], Colonoscopy, flexible; with biopsy, single                            or multiple                           99152, Moderate sedation services provided by the                            same physician or other qualified health care                            professional performing the diagnostic or                            therapeutic service that the sedation supports,                            requiring the presence of an independent trained                            observer to assist in the monitoring of the                            patient's level of consciousness and physiological                            status; initial 15 minutes of intraservice time,                            patient age 4 years or older                           [phone], Moderate sedation services; each additional                            15 minutes intraservice time Diagnosis  Code(s):        --- Professional ---                           K64.4, Residual hemorrhoidal skin tags                           R19.7, Diarrhea, unspecified                           K57.30, Diverticulosis of large intestine without  perforation or abscess without bleeding CPT copyright 2016 [employer]. All rights reserved. The codes documented in this report are preliminary and upon coder review may  be revised to meet current compliance requirements. [name], MD [name], MD 05/26/2017 11:08:22 AM This report has been signed electronically. Number of Addenda: 0

## 2017-05-27 ENCOUNTER — Other Ambulatory Visit: Payer: Self-pay | Admitting: Family Medicine

## 2017-05-30 ENCOUNTER — Encounter (HOSPITAL_COMMUNITY): Payer: Self-pay | Admitting: Internal Medicine

## 2017-06-04 ENCOUNTER — Encounter: Payer: Self-pay | Admitting: Family Medicine

## 2017-06-06 ENCOUNTER — Other Ambulatory Visit: Payer: Self-pay | Admitting: *Deleted

## 2017-06-06 ENCOUNTER — Ambulatory Visit: Payer: Medicare Other | Admitting: Family Medicine

## 2017-06-06 DIAGNOSIS — E119 Type 2 diabetes mellitus without complications: Secondary | ICD-10-CM

## 2017-06-06 DIAGNOSIS — E039 Hypothyroidism, unspecified: Secondary | ICD-10-CM

## 2017-06-06 DIAGNOSIS — E781 Pure hyperglyceridemia: Secondary | ICD-10-CM

## 2017-06-06 DIAGNOSIS — E559 Vitamin D deficiency, unspecified: Secondary | ICD-10-CM

## 2017-06-06 DIAGNOSIS — I10 Essential (primary) hypertension: Secondary | ICD-10-CM

## 2017-06-06 DIAGNOSIS — K219 Gastro-esophageal reflux disease without esophagitis: Secondary | ICD-10-CM

## 2017-06-09 ENCOUNTER — Encounter: Payer: Self-pay | Admitting: Internal Medicine

## 2017-06-10 ENCOUNTER — Other Ambulatory Visit: Payer: Medicare Other

## 2017-06-10 DIAGNOSIS — E781 Pure hyperglyceridemia: Secondary | ICD-10-CM

## 2017-06-10 DIAGNOSIS — K219 Gastro-esophageal reflux disease without esophagitis: Secondary | ICD-10-CM

## 2017-06-10 DIAGNOSIS — E559 Vitamin D deficiency, unspecified: Secondary | ICD-10-CM

## 2017-06-10 DIAGNOSIS — E119 Type 2 diabetes mellitus without complications: Secondary | ICD-10-CM

## 2017-06-10 DIAGNOSIS — E039 Hypothyroidism, unspecified: Secondary | ICD-10-CM | POA: Diagnosis not present

## 2017-06-10 DIAGNOSIS — I10 Essential (primary) hypertension: Secondary | ICD-10-CM | POA: Diagnosis not present

## 2017-06-10 LAB — BAYER DCA HB A1C WAIVED: HB A1C (BAYER DCA - WAIVED): 7 % — ABNORMAL HIGH (ref <7.0)

## 2017-06-11 LAB — HEPATIC FUNCTION PANEL
ALT: 14 IU/L (ref 0–32)
AST: 20 IU/L (ref 0–40)
Albumin: 4.4 g/dL (ref 3.5–4.7)
Alkaline Phosphatase: 80 IU/L (ref 39–117)
Bilirubin Total: 0.3 mg/dL (ref 0.0–1.2)
Bilirubin, Direct: 0.09 mg/dL (ref 0.00–0.40)
Total Protein: 6.9 g/dL (ref 6.0–8.5)

## 2017-06-11 LAB — BMP8+EGFR
BUN/Creatinine Ratio: 17 (ref 12–28)
BUN: 25 mg/dL (ref 8–27)
CO2: 21 mmol/L (ref 20–29)
Calcium: 9.6 mg/dL (ref 8.7–10.3)
Chloride: 100 mmol/L (ref 96–106)
Creatinine, Ser: 1.43 mg/dL — ABNORMAL HIGH (ref 0.57–1.00)
GFR calc Af Amer: 40 mL/min/{1.73_m2} — ABNORMAL LOW (ref >59)
GFR calc non Af Amer: 34 mL/min/{1.73_m2} — ABNORMAL LOW (ref >59)
GLUCOSE: 173 mg/dL — AB (ref 65–99)
POTASSIUM: 5.1 mmol/L (ref 3.5–5.2)
SODIUM: 137 mmol/L (ref 134–144)

## 2017-06-11 LAB — NMR, LIPOPROFILE
Cholesterol, Total: 167 mg/dL (ref 100–199)
HDL Particle Number: 29.9 umol/L — ABNORMAL LOW (ref >=30.5)
HDL-C: 37 mg/dL — ABNORMAL LOW (ref >39)
LDL Particle Number: 916 nmol/L (ref <1000)
LDL SIZE: 20 nm — AB (ref >20.5)
LDL-C: 73 mg/dL (ref 0–99)
LP-IR Score: 79 — ABNORMAL HIGH (ref <=45)
Small LDL Particle Number: 614 nmol/L — ABNORMAL HIGH (ref <=527)
Triglycerides: 283 mg/dL — ABNORMAL HIGH (ref 0–149)

## 2017-06-11 LAB — CBC WITH DIFFERENTIAL/PLATELET
Basophils Absolute: 0.1 10*3/uL (ref 0.0–0.2)
Basos: 1 %
EOS (ABSOLUTE): 0.2 10*3/uL (ref 0.0–0.4)
Eos: 3 %
Hematocrit: 40.8 % (ref 34.0–46.6)
Hemoglobin: 13.7 g/dL (ref 11.1–15.9)
IMMATURE GRANULOCYTES: 1 %
Immature Grans (Abs): 0.1 10*3/uL (ref 0.0–0.1)
Lymphocytes Absolute: 2.1 10*3/uL (ref 0.7–3.1)
Lymphs: 32 %
MCH: 30.4 pg (ref 26.6–33.0)
MCHC: 33.6 g/dL (ref 31.5–35.7)
MCV: 91 fL (ref 79–97)
MONOS ABS: 0.6 10*3/uL (ref 0.1–0.9)
Monocytes: 9 %
NEUTROS PCT: 54 %
Neutrophils Absolute: 3.6 10*3/uL (ref 1.4–7.0)
PLATELETS: 282 10*3/uL (ref 150–379)
RBC: 4.51 x10E6/uL (ref 3.77–5.28)
RDW: 14.6 % (ref 12.3–15.4)
WBC: 6.5 10*3/uL (ref 3.4–10.8)

## 2017-06-11 LAB — THYROID PANEL WITH TSH
Free Thyroxine Index: 3.1 (ref 1.2–4.9)
T3 UPTAKE RATIO: 31 % (ref 24–39)
T4 TOTAL: 9.9 ug/dL (ref 4.5–12.0)
TSH: 3.96 u[IU]/mL (ref 0.450–4.500)

## 2017-06-11 LAB — VITAMIN D 25 HYDROXY (VIT D DEFICIENCY, FRACTURES): Vit D, 25-Hydroxy: 38.7 ng/mL (ref 30.0–100.0)

## 2017-06-15 ENCOUNTER — Ambulatory Visit (INDEPENDENT_AMBULATORY_CARE_PROVIDER_SITE_OTHER): Payer: Medicare Other | Admitting: Family Medicine

## 2017-06-15 ENCOUNTER — Encounter: Payer: Self-pay | Admitting: Family Medicine

## 2017-06-15 VITALS — BP 119/70 | HR 63 | Temp 97.7°F | Ht 63.0 in | Wt 171.0 lb

## 2017-06-15 DIAGNOSIS — E119 Type 2 diabetes mellitus without complications: Secondary | ICD-10-CM

## 2017-06-15 DIAGNOSIS — K219 Gastro-esophageal reflux disease without esophagitis: Secondary | ICD-10-CM | POA: Diagnosis not present

## 2017-06-15 DIAGNOSIS — N183 Chronic kidney disease, stage 3 unspecified: Secondary | ICD-10-CM | POA: Insufficient documentation

## 2017-06-15 DIAGNOSIS — Z789 Other specified health status: Secondary | ICD-10-CM | POA: Diagnosis not present

## 2017-06-15 DIAGNOSIS — E039 Hypothyroidism, unspecified: Secondary | ICD-10-CM

## 2017-06-15 DIAGNOSIS — E781 Pure hyperglyceridemia: Secondary | ICD-10-CM | POA: Diagnosis not present

## 2017-06-15 DIAGNOSIS — N1832 Chronic kidney disease, stage 3b: Secondary | ICD-10-CM | POA: Insufficient documentation

## 2017-06-15 DIAGNOSIS — I10 Essential (primary) hypertension: Secondary | ICD-10-CM | POA: Diagnosis not present

## 2017-06-15 DIAGNOSIS — E559 Vitamin D deficiency, unspecified: Secondary | ICD-10-CM

## 2017-06-15 NOTE — Progress Notes (Signed)
Subjective:    Patient ID: Lisa Sutton, female    DOB: 03-Feb-1934, 84 y.o.   MRN: [id number]  HPI Pt here for follow up and management of chronic medical problems which includes diabetes, hypothyroid, hypertension and hyperlipidemia. She is taking medication regularly.  The patient today complains of bilateral foot pain.  She recently had a colonoscopy by Dr. Melony Overly and this was done on January 3 of this year and he found diverticular disease but no diverticulitis.  She is also had a note from her endocrinologist who recommended taking the metformin and taking 2 of them in the evening and adding one back in the morning if her blood sugars did not respond to to once a day.  She has had blood work done here and all thyroid tests were normal.  Her cholesterol numbers had a total LDL particle number that was good at 916.  Triglycerides however were elevated at 283 and the good cholesterol was low.  Elevated at 173.  The creatinine, the most important kidney function test was elevated at 1.43.  Her sodium potassium and chloride were all normal and this is good for her.  The CBC was within normal limits with a normal white blood cell count a good hemoglobin at 13.7 and an adequate platelet count.  The vitamin D level was also good at 38.7 and stable compared to past readings.  All liver function tests were normal.  Hemoglobin A1c was slightly increased at 7.0 compared to previously.  Does have an upcoming appointment with her endocrinologist.  She denies any chest pain or shortness of breath.  She denies any problems with her stomach except for indigestion which is associated with her diet.  She denies any trouble with passing her water.  There is no blood in the stool or black tarry bowel movements and as mentioned she recently had a colonoscopy that was normal except for diverticulosis.  She indicates that the Livalo is the only thing that she can take or that she can tolerate as far as cholesterol lowering  is concerned.  She is currently taking some samples from our office and taking half of a 4 mg Livalo once daily.  We will see if we have more samples and we will write a prescription for this if we do not have any more samples and if we write a prescription for 3 months able actually last her for 6 months and at least she cannot afford this.    Patient Active Problem List   Diagnosis Date Noted  . Diarrhea 05/09/2017  . History of colonic polyps 05/09/2017  . H/O hypercalcemia 04/12/2017  . Upper airway cough syndrome 09/14/2016  . Hemoptysis 07/16/2016  . Acute pain of right knee 06/02/2016  . Hypothyroidism due to acquired atrophy of thyroid 04/26/2016  . Vitamin D deficiency 04/26/2016  . Status post total left knee replacement 09/15/2015  . Statin intolerance 09/11/2015  . Peripheral neuropathy 07/30/2015  . Abnormal chest x-ray 11/27/2014  . Protein-calorie malnutrition, severe ([place]) 11/27/2014  . AKI (acute kidney injury) ([place]) 11/26/2014  . Hypercalcemia 11/26/2014  . Leukocytosis 11/26/2014  . Weight loss 11/26/2014  . Weakness 11/26/2014  . Insomnia 04/12/2013  . Hyperlipidemia 04/12/2013  . Essential hypertension, benign 10/13/2012  . GERD (gastroesophageal reflux disease) 04/25/2012  . Fatty liver 08/09/2011  . Hepatitis B 08/09/2011  . Dysphagia 08/09/2011  . Type 2 diabetes mellitus with diabetic nephropathy, without long-term current use of insulin ([place]) 08/09/2011  . Hypothyroid 08/09/2011  Outpatient Encounter Medications as of 06/15/2017  Medication Sig  . ACCU-CHEK AVIVA PLUS test strip CHECK BLOOD SUGAR TWO TIMES DAILY  . acetaminophen (TYLENOL) 500 MG tablet Take 1,000 mg by mouth daily as needed for moderate pain or headache.  . Alpha-Lipoic Acid 600 MG CAPS Take 600 mg by mouth 2 (two) times daily.  Marland Kitchen ALPRAZolam (XANAX) 0.25 MG tablet Take 1 tablet (0.25 mg total) by mouth 2 (two) times daily as needed for anxiety. (Patient taking differently: Take 0.25 mg  by mouth at bedtime as needed for sleep. )  . amLODipine (NORVASC) 5 MG tablet TAKE 1 TABLET BY MOUTH  DAILY  . aspirin EC 81 MG tablet Take 1 tablet (81 mg total) by mouth every evening.  Marland Kitchen atenolol (TENORMIN) 50 MG tablet Take 1 tablet (50 mg total) by mouth daily.  . Cholecalciferol (VITAMIN D3) 1000 units CAPS Take 1,000 Units by mouth daily.   . irbesartan (AVAPRO) 150 MG tablet Take 1 tablet (150 mg total) by mouth daily. (Patient taking differently: Take 150 mg by mouth every evening. )  . levothyroxine (SYNTHROID, LEVOTHROID) 112 MCG tablet TAKE 1 TABLET BY MOUTH  DAILY  . levothyroxine (SYNTHROID, LEVOTHROID) 25 MCG tablet TAKE 1 TABLET BY MOUTH  DAILY BEFORE BREAKFAST (Patient taking differently: Take 67mcg on Mondays,Wednesday and Fridays with the 112mg  levothyroxine)  . loperamide (IMODIUM) 2 MG capsule Take 2 mg by mouth daily.   . metFORMIN (GLUCOPHAGE-XR) 500 MG 24 hr tablet Take 1 tablet (500 mg total) by mouth 2 (two) times daily with a meal.  . Multiple Vitamin (MULTIVITAMIN WITH MINERALS) TABS tablet Take 1 tablet by mouth daily.  Marland Kitchen OVER THE COUNTER MEDICATION Apply 1 application topically daily as needed (foot pain). Magnilife otc pain reliever cream  . pantoprazole (PROTONIX) 40 MG tablet Take 1 tablet (40 mg total) by mouth daily before breakfast. (Patient taking differently: Take 40 mg by mouth as needed (acid reflux). )  . Pitavastatin Calcium (LIVALO) 4 MG TABS Take 1 tablet (4 mg total) by mouth every other day. (Patient taking differently: Take 2 mg by mouth daily. )   No facility-administered encounter medications on file as of 06/15/2017.       Review of Systems  Constitutional: Negative.   HENT: Negative.   Eyes: Negative.   Respiratory: Negative.   Cardiovascular: Negative.   Gastrointestinal: Negative.   Endocrine: Negative.        Elevated BS _ appt with endocrinology next week   Genitourinary: Negative.   Musculoskeletal: Positive for arthralgias  (bilateral foot pain ).  Skin: Negative.   Allergic/Immunologic: Negative.   Neurological: Negative.   Hematological: Negative.   Psychiatric/Behavioral: Negative.        Objective:   Physical Exam  Constitutional: She is oriented to person, place, and time. She appears well-developed and well-nourished. No distress.  The patient is pleasant and relaxed and feeling good about herself currently other than the expense of the Livalo which is the only thing she can tolerate for her cholesterol.  We will try to do an appeal to her insurance company.  HENT:  Head: Normocephalic and atraumatic.  Right Ear: External ear normal.  Left Ear: External ear normal.  Nose: Nose normal.  Mouth/Throat: Oropharynx is clear and moist. No oropharyngeal exudate.  Eyes: Conjunctivae and EOM are normal. Pupils are equal, round, and reactive to light. Right eye exhibits no discharge. Left eye exhibits no discharge. No scleral icterus.  Because of her diabetes she gets  her eye exams done every Monday.  Neck: Normal range of motion. Neck supple. No thyromegaly present.  No bruits thyromegaly or anterior cervical adenopathy  Cardiovascular: Normal rate, regular rhythm, normal heart sounds and intact distal pulses.  No murmur heard. Heart is regular at 60/min  Pulmonary/Chest: Effort normal and breath sounds normal. No respiratory distress. She has no wheezes. She has no rales.  Clear anteriorly and posteriorly  Abdominal: Soft. Bowel sounds are normal. She exhibits no mass. There is no tenderness. There is no rebound and no guarding.  No liver or spleen enlargement masses organ enlargement or bruits  Musculoskeletal: Normal range of motion. She exhibits no edema.  Lymphadenopathy:    She has no cervical adenopathy.  Neurological: She is alert and oriented to person, place, and time. She has normal reflexes. No cranial nerve deficit.  Skin: Skin is warm and dry. No rash noted.  Dry skin  Psychiatric: She has  a normal mood and affect. Her behavior is normal. Judgment and thought content normal.  Nursing note and vitals reviewed.   BP 119/70 (BP Location: Left Arm)   Pulse 63   Temp 97.7 F (36.5 C) (Oral)   Ht 5\' 3"  (1.6 m)   Wt 171 lb (77.6 kg)   BMI 30.29 kg/m       Assessment & Plan:  1. Type 2 diabetes mellitus without complication, without long-term current use of insulin ([place]) -Continue with current treatment for diabetes and follow-up with endocrinologist as planned  2. Hypothyroidism, unspecified type -Continue with current thyroid treatment  3. Essential hypertension, benign -Blood pressure is good today and she will continue with current treatment  4. Hypertriglyceridemia -Triglycerides are elevated.  She should pursue once again aggressive therapeutic lifestyle changes.  I am sure that her insurance would not cover the use of vascepa other than at a very high cost.  5. Gastroesophageal reflux disease without esophagitis -No problems with this today.  6. Vitamin D deficiency -Continue with current vitamin D replacement   7.  Elevated creatinine -We will continue to monitor this closely and the patient will continue to avoid all NSAIDs like ibuprofen and Aleve  Patient Instructions                       Medicare Annual Wellness Visit  [facility] and the medical providers at [facility] strive to bring you the best medical care.  In doing so we not only want to address your current medical conditions and concerns but also to detect new conditions early and prevent illness, disease and health-related problems.    Medicare offers a yearly Wellness Visit which allows our clinical staff to assess your need for preventative services including immunizations, lifestyle education, counseling to decrease risk of preventable diseases and screening for fall risk and other medical concerns.    This visit is provided free of charge (no copay) for all Medicare  recipients. The clinical pharmacists at [facility] have begun to conduct these Wellness Visits which will also include a thorough review of all your medications.    As you primary medical provider recommend that you make an appointment for your Annual Wellness Visit if you have not done so already this year.  You may set up this appointment before you leave today or you may call back ([phone]) and schedule an appointment.  Please make sure when you call that you mention that you are scheduling your Annual Wellness Visit  with the clinical pharmacist so that the appointment may be made for the proper length of time.     Continue current medications. Continue good therapeutic lifestyle changes which include good diet and exercise. Fall precautions discussed with patient. If an FOBT was given today- please return it to our front desk. If you are over 69 years old - you may need Prevnar 37 or the adult Pneumonia vaccine.  **Flu shots are available--- please call and schedule a FLU-CLINIC appointment**  After your visit with [place] today you will receive a survey in the mail or online from [employer] regarding your care with [place]. Please take a moment to fill this out. Your feedback is very important to [place] as you can help [place] better understand your patient needs as well as improve your experience and satisfaction. WE CARE ABOUT YOU!!!   Continue with Livalo by taking 4 mg daily as directed Follow-up with endocrinologist as planned Continue her recommendations as far as the slow release metformin Take blood sugars with you to that visit so that she can make more adjustments if needed Take lab work with you to that visit Discussed with her the option of trying some gabapentin at bedtime Work hard with diet and exercise to avoid not having to take any more medications Use soaps detergents and fabric softeners that are scent free    Arrie Senate MD

## 2017-06-15 NOTE — Patient Instructions (Addendum)
Medicare Annual Wellness Visit  [facility] and the medical providers at [facility] strive to bring you the best medical care.  In doing so we not only want to address your current medical conditions and concerns but also to detect new conditions early and prevent illness, disease and health-related problems.    Medicare offers a yearly Wellness Visit which allows our clinical staff to assess your need for preventative services including immunizations, lifestyle education, counseling to decrease risk of preventable diseases and screening for fall risk and other medical concerns.    This visit is provided free of charge (no copay) for all Medicare recipients. The clinical pharmacists at [facility] have begun to conduct these Wellness Visits which will also include a thorough review of all your medications.    As you primary medical provider recommend that you make an appointment for your Annual Wellness Visit if you have not done so already this year.  You may set up this appointment before you leave today or you may call back ([phone]) and schedule an appointment.  Please make sure when you call that you mention that you are scheduling your Annual Wellness Visit with the clinical pharmacist so that the appointment may be made for the proper length of time.     Continue current medications. Continue good therapeutic lifestyle changes which include good diet and exercise. Fall precautions discussed with patient. If an FOBT was given today- please return it to our front desk. If you are over 99 years old - you may need Prevnar 77 or the adult Pneumonia vaccine.  **Flu shots are available--- please call and schedule a FLU-CLINIC appointment**  After your visit with [place] today you will receive a survey in the mail or online from [employer] regarding your care with [place]. Please take a moment to fill this out. Your feedback is very  important to [place] as you can help [place] better understand your patient needs as well as improve your experience and satisfaction. WE CARE ABOUT YOU!!!   Continue with Livalo by taking 4 mg daily as directed Follow-up with endocrinologist as planned Continue her recommendations as far as the slow release metformin Take blood sugars with you to that visit so that she can make more adjustments if needed Take lab work with you to that visit Discussed with her the option of trying some gabapentin at bedtime Work hard with diet and exercise to avoid not having to take any more medications Use soaps detergents and fabric softeners that are scent free

## 2017-06-16 ENCOUNTER — Telehealth: Payer: Self-pay

## 2017-06-16 NOTE — Telephone Encounter (Signed)
I  Have not received a prior auth for Ms Lanyon for Chanda Busing  Last time filled in our computer 5/18 and I need to know if she is taking 4 mg or 2 mg  Two different directions on the RX  I will start a prior auth on my own but need answers to these questions

## 2017-06-16 NOTE — Telephone Encounter (Signed)
Will be taken 4 mg daily

## 2017-06-17 ENCOUNTER — Telehealth: Payer: Self-pay

## 2017-06-17 NOTE — Telephone Encounter (Signed)
Good news.  Please call patient and let her know this.

## 2017-06-17 NOTE — Telephone Encounter (Signed)
Livalo did not need prior authorization   It is on the plan's list of covered drugs

## 2017-06-22 ENCOUNTER — Encounter: Payer: Self-pay | Admitting: Internal Medicine

## 2017-06-22 ENCOUNTER — Ambulatory Visit: Payer: Medicare Other | Admitting: Internal Medicine

## 2017-06-22 VITALS — BP 128/80 | HR 65 | Ht 63.0 in | Wt 170.4 lb

## 2017-06-22 DIAGNOSIS — G63 Polyneuropathy in diseases classified elsewhere: Secondary | ICD-10-CM | POA: Diagnosis not present

## 2017-06-22 DIAGNOSIS — Z8639 Personal history of other endocrine, nutritional and metabolic disease: Secondary | ICD-10-CM

## 2017-06-22 DIAGNOSIS — E1121 Type 2 diabetes mellitus with diabetic nephropathy: Secondary | ICD-10-CM | POA: Diagnosis not present

## 2017-06-22 MED ORDER — METFORMIN HCL ER 500 MG PO TB24: ORAL_TABLET | ORAL | 3 refills | Status: DC

## 2017-06-22 NOTE — Progress Notes (Signed)
Patient ID: Lisa Sutton, female   DOB: 09/29/1933, 84 y.o.   MRN: [id number]   HPI: Lisa Sutton is a 84 y.o.-year-old female whom I previously saw for hypercalcemia in 2016, now returning for DM2 (new condition for me), dx in 2015, non-insulin-dependent, uncontrolled, with complications (CKD, peripheral neuropathy).  Last visit 2 months ago.  She started to drink Glucerna between meals 1-2 weeks ago >> sugars higher after this.  Last hemoglobin A1c was: 06/10/2017: HbA1c 7.0% 01/2017: HbA1c 6.1%  Lab Results  Component Value Date   HGBA1C 6.2 (H) 09/03/2015   HGBA1C 5.9 05/27/2015   HGBA1C 6.1 03/13/2015   Pt was on a regimen of: - Glyburide 2.5 mg before b'fast - started this after she stopped Metformin in 01/2017 - but she developed symptomatic hypoglycemic episodes and she had to stop Could not tolerate regular metformin due to diarrhea. She could not afford Januvia.  At last visit, we started: - Metformin ER 500 mg in am and 1000 mg with dinner - just started  Pt checks her sugars 2 to 4 times a day: - am: 118-146 >> 155-200 - 2h after b'fast: n/c >> 141-252 - before lunch: 61-93 >> 111-143, 183 - 2h after lunch: 110, 124, 203 >> 90-145 - before dinner: 75-195, 208 >> 109 - 2h after dinner: 136-196 >> 132-251, 272 - bedtime: 121-188, 201, 227 >> 123-220 - nighttime: n/c Lowest sugar was 61 >> 90; she has hypoglycemia awareness in the 70s.  Highest sugar was 227 >> 272.  Glucometer: Accu-Chek Aviva  Pt's meals are: - Breakfast: 2 boiled eggs + toast + tomato or croissants + am and egg - Lunch: [place] sandwich + lettuce + tomato + mayo - Dinner: green beans, slaw + potatoes + meatloaf   - Snacks: crackers + PB Diet green tea.  -+ CKD - sees nephrology in [place], last BUN/creatinine:  Lab Results  Component Value Date   BUN 25 06/10/2017   BUN 23 01/27/2017   CREATININE 1.43 (H) 06/10/2017   CREATININE 1.30 (H) 01/27/2017  Irbesartan. -+ + HL; last  set of lipids: Lab Results  Component Value Date   CHOL 195 01/21/2017   HDL 46 01/21/2017   LDLCALC 89 01/21/2017   LDLDIRECT 99 11/01/2016   TRIG 298 (H) 01/21/2017   CHOLHDL 4.2 01/21/2017  On Livalo. Just started Vascepa 2g 2x a day. - last eye exam was in 09/2016: No DR - She has numbness and tingling in her feet.  Previously on Neurontin, which was stopped at last visit for concerns of weight gain.   She also has a history of hypothyroidism, and is on levothyroxine 112 mcg daily.  Latest TSH normal: Lab Results  Component Value Date   TSH 3.960 06/10/2017   She has a history of non-PTH dependent hypercalcemia which normalized after stopping HCTZ: Lab Results  Component Value Date   PTH 11 (L) 06/04/2015   PTH Comment 06/04/2015   PTH 11 (L) 05/29/2015   PTH Comment 05/29/2015   PTH 6 (L) 01/13/2015   PTH Comment 01/13/2015   PTH 12 (L) 12/02/2014   PTH 5 (L) 11/26/2014   CALCIUM 9.6 06/10/2017   CALCIUM 9.4 01/27/2017   CALCIUM 10.7 (H) 01/21/2017   CALCIUM 10.2 11/01/2016   CALCIUM 9.5 09/03/2016   CALCIUM 9.7 04/21/2016   CALCIUM 9.3 09/18/2015   CALCIUM 8.9 09/17/2015   CALCIUM 8.6 (L) 09/16/2015   CALCIUM 9.6 09/08/2015   CALCIUM 10.5 (H) 09/03/2015  CALCIUM 9.3 06/04/2015   CALCIUM CANCELED 05/29/2015   CALCIUM CANCELED 05/27/2015   CALCIUM 11.0 (H) 04/28/2015   CALCIUM 11.8 (H) 04/26/2015   CALCIUM 10.2 03/13/2015   CALCIUM 10.5 (H) 02/17/2015   CALCIUM 11.9 (H) 02/07/2015   CALCIUM 11.3 (H) 01/13/2015   Latest vitamin D was normal: Lab Results  Component Value Date   VD25OH 38.7 06/10/2017   VD25OH 40.5 01/21/2017   VD25OH 32.9 09/03/2016   VD25OH 63.1 05/27/2015   VD25OH 37.8 03/13/2015   VD25OH 41.1 02/17/2015   VD25OH 53.3 10/17/2014   VD25OH 24.9 (L) 06/19/2014   VD25OH 23.4 (L) 03/11/2014   ROS: Constitutional: + Weight loss of 6 pounds in last month, no fatigue, no subjective hyperthermia, no subjective hypothermia Eyes: no blurry  vision, no xerophthalmia ENT: no sore throat, no nodules palpated in throat, no dysphagia, no odynophagia, no hoarseness Cardiovascular: no CP/no SOB/no palpitations/no leg swelling Respiratory: no cough/no SOB/no wheezing Gastrointestinal: no N/no V/no D/no C/no acid reflux Musculoskeletal: no muscle aches/no joint aches Skin: no rashes, no hair loss Neurological: no tremors/+ numbness/+ tingling/no dizziness  I reviewed pt's medications, allergies, PMH, social hx, family hx, and changes were documented in the history of present illness. Otherwise, unchanged from my initial visit note.  Past Medical History:  Diagnosis Date  . Anxiety   . Arthritis   . Cataract   . Dysphagia    'sometimes but not a major issue' been checked out by GI (per pt)  . Dysrhythmia    'heart used to skip but doesn't anymore' was checked out by Dr. Ron Parker late '90s, everything checked out ok and not had any skipping since (all per pt)  . Essential hypertension, benign   . Fatty liver   . GERD (gastroesophageal reflux disease)   . Hepatitis B    had at age 38, 'GI doc said it's gone away'  . Hepatitis B surface antigen positive   . History of hiatal hernia   . Hypercalcemia   . Hypothyroidism   . Kidney stone August 2014   Patient was seen at [facility]  . Obesity   . Osteoporosis   . PONV (postoperative nausea and vomiting)   . Type 2 diabetes mellitus ([place])    Past Surgical History:  Procedure Laterality Date  . ABDOMINAL HYSTERECTOMY    . BILATERAL CARPAL TUNNEL RELEASE     Dr. Marlou Sa at surgical center  . BIOPSY  05/26/2017   Procedure: BIOPSY;  Surgeon: Rogene Houston, MD;  [facility];  Service: Endoscopy;;  colon  . CATARACT EXTRACTION, BILATERAL    . COLONOSCOPY N/A 05/26/2017   Procedure: COLONOSCOPY;  Surgeon: Rogene Houston, MD;  [facility];  Service: Endoscopy;  Laterality: N/A;  10:30  . Deviated septum repair    . ESOPHAGOGASTRODUODENOSCOPY N/A 07/28/2016    Procedure: ESOPHAGOGASTRODUODENOSCOPY (EGD);  Surgeon: Rogene Houston, MD;  Location: [facility];  Service: Endoscopy;  Laterality: N/A;  300 - moved to 3/7 @ 1:00  . EYE SURGERY    . KNEE ARTHROPLASTY Left 09/15/2015   Procedure: COMPUTER ASSISTED TOTAL KNEE ARTHROPLASTY;  Surgeon: Marybelle Killings, MD;  Location: [facility];  Service: Orthopedics;  Laterality: Left;  . KNEE ARTHROSCOPY     left knee 2003   Social History   Socioeconomic History  . Marital status: Widowed    Spouse name: Not on file  . Number of children: Not on file  . Years of education: Not on  file  . Highest education level: Not on file  Social Needs  . Financial resource strain: Not on file  . Food insecurity - worry: Not on file  . Food insecurity - inability: Not on file  . Transportation needs - medical: Not on file  . Transportation needs - non-medical: Not on file  Occupational History  . Not on file  Tobacco Use  . Smoking status: Never Smoker  . Smokeless tobacco: Never Used  Substance and Sexual Activity  . Alcohol use: No    Alcohol/week: 0.0 oz  . Drug use: No  . Sexual activity: No  Other Topics Concern  . Not on file  Social History Narrative  . Not on file   Current Outpatient Medications on File Prior to Visit  Medication Sig Dispense Refill  . ACCU-CHEK AVIVA PLUS test strip CHECK BLOOD SUGAR TWO TIMES DAILY 200 each 1  . acetaminophen (TYLENOL) 500 MG tablet Take 1,000 mg by mouth daily as needed for moderate pain or headache.    . Alpha-Lipoic Acid 600 MG CAPS Take 600 mg by mouth 2 (two) times daily.    Marland Kitchen ALPRAZolam (XANAX) 0.25 MG tablet Take 1 tablet (0.25 mg total) by mouth 2 (two) times daily as needed for anxiety. (Patient taking differently: Take 0.25 mg by mouth at bedtime as needed for sleep. ) 180 tablet 1  . amLODipine (NORVASC) 5 MG tablet TAKE 1 TABLET BY MOUTH  DAILY 90 tablet 1  . aspirin EC 81 MG tablet Take 1 tablet (81 mg total) by mouth every evening.    Marland Kitchen atenolol  (TENORMIN) 50 MG tablet Take 1 tablet (50 mg total) by mouth daily. 90 tablet 3  . Cholecalciferol (VITAMIN D3) 1000 units CAPS Take 1,000 Units by mouth daily.     . irbesartan (AVAPRO) 150 MG tablet Take 1 tablet (150 mg total) by mouth daily. (Patient taking differently: Take 150 mg by mouth every evening. ) 90 tablet 3  . levothyroxine (SYNTHROID, LEVOTHROID) 112 MCG tablet TAKE 1 TABLET BY MOUTH  DAILY 90 tablet 2  . levothyroxine (SYNTHROID, LEVOTHROID) 25 MCG tablet TAKE 1 TABLET BY MOUTH  DAILY BEFORE BREAKFAST (Patient taking differently: Take 52mcg on Mondays,Wednesday and Fridays with the 112mg  levothyroxine) 90 tablet 2  . loperamide (IMODIUM) 2 MG capsule Take 2 mg by mouth daily.     . metFORMIN (GLUCOPHAGE-XR) 500 MG 24 hr tablet Take 1 tablet (500 mg total) by mouth 2 (two) times daily with a meal. 60 tablet 1  . Multiple Vitamin (MULTIVITAMIN WITH MINERALS) TABS tablet Take 1 tablet by mouth daily.    Marland Kitchen OVER THE COUNTER MEDICATION Apply 1 application topically daily as needed (foot pain). Magnilife otc pain reliever cream    . pantoprazole (PROTONIX) 40 MG tablet Take 1 tablet (40 mg total) by mouth daily before breakfast. (Patient taking differently: Take 40 mg by mouth as needed (acid reflux). ) 90 tablet 3  . Pitavastatin Calcium (LIVALO) 4 MG TABS Take 1 tablet (4 mg total) by mouth every other day. (Patient taking differently: Take 2 mg by mouth daily. ) 21 tablet 0  . [DISCONTINUED] calcium citrate-vitamin D 200-200 MG-UNIT TABS Take 1 tablet by mouth daily.       No current facility-administered medications on file prior to visit.    Allergies  Allergen Reactions  . Statins Other (See Comments)    'discomfort, aching everywhere' Tolerates to livalo   . Zetia [Ezetimibe] Other (See Comments)  Leg pain  . Zocor [Simvastatin] Other (See Comments)    Leg pain   Family History  Problem Relation Age of Onset  . Hypertension Father   . Hip fracture Father   . COPD  Mother   . Cancer Maternal Aunt   . Diabetes Maternal Aunt   . Diabetes Maternal Uncle   . Colon cancer Neg Hx     PE: BP 128/80   Pulse 65   Ht 5\' 3"  (1.6 m)   Wt 170 lb 6.4 oz (77.3 kg)   SpO2 97%   BMI 30.19 kg/m  Wt Readings from Last 3 Encounters:  06/22/17 170 lb 6.4 oz (77.3 kg)  06/15/17 171 lb (77.6 kg)  05/26/17 176 lb (79.8 kg)   Constitutional: overweight, in NAD Eyes: PERRLA, EOMI, no exophthalmos ENT: moist mucous membranes, no thyromegaly, no cervical lymphadenopathy Cardiovascular: RRR, No MRG Respiratory: CTA B Gastrointestinal: abdomen soft, NT, ND, BS+ Musculoskeletal: no deformities, strength intact in all 4 Skin: moist, warm, no rashes Neurological: no tremor with outstretched hands, DTR normal in all 4  ASSESSMENT: 1. DM2, non-insulin-dependent, uncontrolled, with complications - CKD - PN  2.  Peripheral neuropathy secondary to diabetes  3. Hypercalcemia  PLAN:  1. Patient with relatively recent diabetes, previously well controlled on metformin, however, she developed diarrhea and she had to stop the medication 2 months before our last visit.  She was tried on glyburide, but she developed low blood sugars after breakfast.  As we were limited in our options due to the fact that she was a Medicare patient and also because of kidney disease, I suggested to try metformin ER first, and then try low-dose glipizide ER if she could not tolerated.  I would have preferred to use a DPP 4 inhibitor, but she tried to get this in the past and this was very expensive - sugars are higher after she started Glucerna (mostly after dinner and then in am) >> advised to stop but only usee it if she does not have time to eat a meal. - we also discussed about other dietary changes >> change to a more plant-based diet to reduce her insulin resistance  - if sugars are still high after dinner after she does the above changes >> may need a low dose Glipizide before dinner - I  suggested to:  Patient Instructions  Please continue Metformin ER 500 mg in am and 1000 mg at dinnertime.  Stop Glucerna.  If sugars at bedtime do not improve after stopping Glucerna, we need to start Glipizide 2.5 mg before dinner.  Please return in 3-4 months with your sugar log.   Please look up: Rip Esselstyn - The Engine 2 diet   Rip Esselstyn - The [employer] 2 7-day Rescue diet  - Reviewed her most recent HbA1c, which was higher, at 7% (previously 6.2%), but this is still at goal for age. - continue checking sugars at different times of the day - check 1x a day, rotating checks - advised for yearly eye exams >> she is UTD - Return to clinic in 3-4 mo with sugar log    2.  Peripheral neuropathy secondary to diabetes -Continues on Neurontin, but she does not feel that she needs it every night.  We did stop at last visit as she was concerned that she had weight gain from this -At last visit, I suggested alpha lipoic acid and B complex >> she started these  3. Hypercalcemia -Mild  -Latest calcium levels were  normal -She had suppressed PTH normal vitamin D and latest checks -This was likely secondary to HCTZ and it resolved after stopping the medication  Philemon Kingdom, MD PhD [facility] Endocrinology

## 2017-06-22 NOTE — Patient Instructions (Addendum)
Please continue Metformin ER 500 mg in am and 1000 mg at dinnertime.  Stop Glucerna.  If sugars at bedtime do not improve after stopping Glucerna, we need to start Glipizide 2.5 mg before dinner.  Please return in 3-4 months with your sugar log.   Please look up: Christa See - The Engine 2 diet   Rip Esselstyn - The Engine 2 7-day Rescue diet

## 2017-06-23 ENCOUNTER — Encounter: Payer: Self-pay | Admitting: Family Medicine

## 2017-06-28 ENCOUNTER — Other Ambulatory Visit: Payer: Self-pay | Admitting: Family Medicine

## 2017-06-28 DIAGNOSIS — Z1231 Encounter for screening mammogram for malignant neoplasm of breast: Secondary | ICD-10-CM | POA: Diagnosis not present

## 2017-07-26 ENCOUNTER — Other Ambulatory Visit: Payer: Self-pay | Admitting: Family Medicine

## 2017-07-26 DIAGNOSIS — R591 Generalized enlarged lymph nodes: Secondary | ICD-10-CM

## 2017-08-11 ENCOUNTER — Ambulatory Visit: Payer: Medicare Other | Admitting: Internal Medicine

## 2017-08-11 ENCOUNTER — Encounter: Payer: Self-pay | Admitting: Internal Medicine

## 2017-08-11 VITALS — BP 132/76 | HR 65 | Ht 63.0 in | Wt 167.0 lb

## 2017-08-11 DIAGNOSIS — E034 Atrophy of thyroid (acquired): Secondary | ICD-10-CM | POA: Diagnosis not present

## 2017-08-11 DIAGNOSIS — G63 Polyneuropathy in diseases classified elsewhere: Secondary | ICD-10-CM | POA: Diagnosis not present

## 2017-08-11 DIAGNOSIS — E1121 Type 2 diabetes mellitus with diabetic nephropathy: Secondary | ICD-10-CM

## 2017-08-11 MED ORDER — GLIPIZIDE 5 MG PO TABS: 2.5000 mg | ORAL_TABLET | Freq: Every day | ORAL | 3 refills | Status: DC

## 2017-08-11 NOTE — Progress Notes (Signed)
Patient ID: Lisa Sutton, female   DOB: 14-Nov-1933, 84 y.o.   MRN: [id number]   HPI: Lisa Sutton is a 83 y.o.-year-old female whom I previously saw for hypercalcemia in 2016, now returning for DM2 (new condition for me), dx in 2015, non-insulin-dependent, uncontrolled, with complications (CKD, peripheral neuropathy).  Last visit 1.5 months ago.  Before last visit, she started to drink Glucerna between meals and her sugars increased.  I advised her to stop Glucerna.She did that >> sugars better.  She goes to [employer] 3 times a week x4 weeks.  Last hemoglobin A1c was: 06/10/2017: HbA1c 7.0% 01/2017: HbA1c 6.1%  Lab Results  Component Value Date   HGBA1C 6.2 (H) 09/03/2015   HGBA1C 5.9 05/27/2015   HGBA1C 6.1 03/13/2015   Pt was on a regimen of: - Glyburide 2.5 mg before b'fast - started this after she stopped Metformin in 01/2017 - but she developed symptomatic hypoglycemic episodes and she had to stop Could not tolerate regular metformin due to diarrhea. She could not afford Januvia.  At last visit, we started: - Metformin ER 500 mg in am and 1000 mg with dinner   Pt checks her sugars 2-4x a day: - am: 118-146 >> 155-200 >> 140-168 - 2h after b'fast: n/c >> 141-252 >> 122-145 - before lunch: 61-93 >> 111-143, 183 >> 100-136 - 2h after lunch:  90-145 >> 109, 170, 185 - before dinner: 75-195, 208 >> 109 >> 90-127 - 2h after dinner: 132-251, 272 >> 105-145 - bedtime:  123-220, 157 >> 129, 208, 222 - nighttime: n/c Lowest sugar was 61 >> 90 >> 100; she has hypoglycemia awareness in the 70s.  Highest sugar was 227 >> 272 >> 222.  Glucometer: Accu-Chek Aviva  Pt's meals are: - Breakfast: 2 boiled eggs + toast + tomato or croissants + am and egg - Lunch: [place] sandwich + lettuce + tomato + mayo - Dinner: green beans, slaw + potatoes + meatloaf   - Snacks: crackers + PB Diet green tea.  -+ CKD - sees nephrology in [place], last BUN/creatinine:  Lab Results   Component Value Date   BUN 25 06/10/2017   BUN 23 01/27/2017   CREATININE 1.43 (H) 06/10/2017   CREATININE 1.30 (H) 01/27/2017  On Irbesartan.  - +HL; last set of lipids: Lab Results  Component Value Date   CHOL 195 01/21/2017   HDL 46 01/21/2017   LDLCALC 89 01/21/2017   LDLDIRECT 99 11/01/2016   TRIG 298 (H) 01/21/2017   CHOLHDL 4.2 01/21/2017  On Livalo and Vascepa 2g 2x a day - last eye exam was in 09/2016: No DR - + numbness and tingling in her feet.  Previously on Neurontin >> stopped 2/2 wt gain. Now on B complex and ALA.   She also has a history of hypothyroidism, and is on levothyroxine 112 mcg daily.  Latest TSH normal: Lab Results  Component Value Date   TSH 3.960 06/10/2017   She has a history of non-PTH dependent hypercalcemia - normal after stopping HCTZ: Lab Results  Component Value Date   PTH 11 (L) 06/04/2015   PTH Comment 06/04/2015   PTH 11 (L) 05/29/2015   PTH Comment 05/29/2015   PTH 6 (L) 01/13/2015   PTH Comment 01/13/2015   PTH 12 (L) 12/02/2014   PTH 5 (L) 11/26/2014   CALCIUM 9.6 06/10/2017   CALCIUM 9.4 01/27/2017   CALCIUM 10.7 (H) 01/21/2017   CALCIUM 10.2 11/01/2016   CALCIUM 9.5 09/03/2016  CALCIUM 9.7 04/21/2016   CALCIUM 9.3 09/18/2015   CALCIUM 8.9 09/17/2015   CALCIUM 8.6 (L) 09/16/2015   CALCIUM 9.6 09/08/2015   CALCIUM 10.5 (H) 09/03/2015   CALCIUM 9.3 06/04/2015   CALCIUM CANCELED 05/29/2015   CALCIUM CANCELED 05/27/2015   CALCIUM 11.0 (H) 04/28/2015   CALCIUM 11.8 (H) 04/26/2015   CALCIUM 10.2 03/13/2015   CALCIUM 10.5 (H) 02/17/2015   CALCIUM 11.9 (H) 02/07/2015   CALCIUM 11.3 (H) 01/13/2015   Latest vitamin D normal: Lab Results  Component Value Date   VD25OH 38.7 06/10/2017   VD25OH 40.5 01/21/2017   VD25OH 32.9 09/03/2016   VD25OH 63.1 05/27/2015   VD25OH 37.8 03/13/2015   VD25OH 41.1 02/17/2015   VD25OH 53.3 10/17/2014   VD25OH 24.9 (L) 06/19/2014   VD25OH 23.4 (L) 03/11/2014   ROS: Constitutional: +  weight loss, no fatigue, no subjective hyperthermia, no subjective hypothermia Eyes: no blurry vision, no xerophthalmia ENT: no sore throat, no nodules palpated in throat, no dysphagia, no odynophagia, no hoarseness Cardiovascular: no CP/no SOB/no palpitations/no leg swelling Respiratory: no cough/no SOB/no wheezing Gastrointestinal: no N/no V/no D/no C/no acid reflux Musculoskeletal: no muscle aches/no joint aches Skin: no rashes, no hair loss Neurological: no tremors/no numbness/no tingling/no dizziness  I reviewed pt's medications, allergies, PMH, social hx, family hx, and changes were documented in the history of present illness. Otherwise, unchanged from my initial visit note.  Past Medical History:  Diagnosis Date  . Anxiety   . Arthritis   . Cataract   . Dysphagia    'sometimes but not a major issue' been checked out by GI (per pt)  . Dysrhythmia    'heart used to skip but doesn't anymore' was checked out by Dr. Ron Parker late '90s, everything checked out ok and not had any skipping since (all per pt)  . Essential hypertension, benign   . Fatty liver   . GERD (gastroesophageal reflux disease)   . Hepatitis B    had at age 74, 'GI doc said it's gone away'  . Hepatitis B surface antigen positive   . History of hiatal hernia   . Hypercalcemia   . Hypothyroidism   . Kidney stone August 2014   Patient was [facility] at [facility]  . Obesity   . Osteoporosis   . PONV (postoperative nausea and vomiting)   . Type 2 diabetes mellitus ([place])    Past Surgical History:  Procedure Laterality Date  . ABDOMINAL HYSTERECTOMY    . BILATERAL CARPAL TUNNEL RELEASE     Dr. Marlou Sa at surgical center  . BIOPSY  05/26/2017   Procedure: BIOPSY;  Surgeon: Rogene Houston, MD;  [facility];  Service: Endoscopy;;  colon  . CATARACT EXTRACTION, BILATERAL    . COLONOSCOPY N/A 05/26/2017   Procedure: COLONOSCOPY;  Surgeon: Rogene Houston, MD;  [facility];  Service: Endoscopy;   Laterality: N/A;  10:30  . Deviated septum repair    . ESOPHAGOGASTRODUODENOSCOPY N/A 07/28/2016   Procedure: ESOPHAGOGASTRODUODENOSCOPY (EGD);  Surgeon: Rogene Houston, MD;  Location: [facility];  Service: Endoscopy;  Laterality: N/A;  300 - moved to 3/7 @ 1:00  . EYE SURGERY    . KNEE ARTHROPLASTY Left 09/15/2015   Procedure: COMPUTER ASSISTED TOTAL KNEE ARTHROPLASTY;  Surgeon: Marybelle Killings, MD;  Location: [facility];  Service: Orthopedics;  Laterality: Left;  . KNEE ARTHROSCOPY     left knee 2003   Social History   Socioeconomic History  . Marital  status: Widowed    Spouse name: Not on file  . Number of children: Not on file  . Years of education: Not on file  . Highest education level: Not on file  Occupational History  . Not on file  Social Needs  . Financial resource strain: Not on file  . Food insecurity:    Worry: Not on file    Inability: Not on file  . Transportation needs:    Medical: Not on file    Non-medical: Not on file  Tobacco Use  . Smoking status: Never Smoker  . Smokeless tobacco: Never Used  Substance and Sexual Activity  . Alcohol use: No    Alcohol/week: 0.0 oz  . Drug use: No  . Sexual activity: Never  Lifestyle  . Physical activity:    Days per week: Not on file    Minutes per session: Not on file  . Stress: Not on file  Relationships  . Social connections:    Talks on phone: Not on file    Gets together: Not on file    Attends religious service: Not on file    Active member of club or organization: Not on file    Attends meetings of clubs or organizations: Not on file    Relationship status: Not on file  . Intimate partner violence:    Fear of current or ex partner: Not on file    Emotionally abused: Not on file    Physically abused: Not on file    Forced sexual activity: Not on file  Other Topics Concern  . Not on file  Social History Narrative  . Not on file   Current Outpatient Medications on File Prior to Visit  Medication Sig  Dispense Refill  . ACCU-CHEK AVIVA PLUS test strip CHECK BLOOD SUGAR TWO TIMES DAILY 200 each 3  . acetaminophen (TYLENOL) 500 MG tablet Take 1,000 mg by mouth daily as needed for moderate pain or headache.    . Alpha-Lipoic Acid 600 MG CAPS Take 600 mg by mouth 2 (two) times daily.    Marland Kitchen ALPRAZolam (XANAX) 0.25 MG tablet Take 1 tablet (0.25 mg total) by mouth 2 (two) times daily as needed for anxiety. (Patient taking differently: Take 0.25 mg by mouth at bedtime as needed for sleep. ) 180 tablet 1  . amLODipine (NORVASC) 5 MG tablet TAKE 1 TABLET BY MOUTH  DAILY 90 tablet 1  . aspirin EC 81 MG tablet Take 1 tablet (81 mg total) by mouth every evening.    Marland Kitchen atenolol (TENORMIN) 50 MG tablet Take 1 tablet (50 mg total) by mouth daily. 90 tablet 3  . Cholecalciferol (VITAMIN D3) 1000 units CAPS Take 1,000 Units by mouth daily.     . irbesartan (AVAPRO) 150 MG tablet Take 1 tablet (150 mg total) by mouth daily. (Patient taking differently: Take 150 mg by mouth every evening. ) 90 tablet 3  . levothyroxine (SYNTHROID, LEVOTHROID) 112 MCG tablet TAKE 1 TABLET BY MOUTH  DAILY 90 tablet 3  . levothyroxine (SYNTHROID, LEVOTHROID) 25 MCG tablet TAKE 1 TABLET BY MOUTH  DAILY BEFORE BREAKFAST (Patient taking differently: Take 70mcg on Mondays,Wednesday and Fridays with the 112mg  levothyroxine) 90 tablet 2  . loperamide (IMODIUM) 2 MG capsule Take 2 mg by mouth daily.     . metFORMIN (GLUCOPHAGE-XR) 500 MG 24 hr tablet Take 1 tablet in am and 2 tablet with dinner. 270 tablet 3  . Multiple Vitamin (MULTIVITAMIN WITH MINERALS) TABS tablet Take 1 tablet  by mouth daily.    Marland Kitchen OVER THE COUNTER MEDICATION Apply 1 application topically daily as needed (foot pain). Magnilife otc pain reliever cream    . pantoprazole (PROTONIX) 40 MG tablet Take 1 tablet (40 mg total) by mouth daily before breakfast. (Patient taking differently: Take 40 mg by mouth as needed (acid reflux). ) 90 tablet 3  . Pitavastatin Calcium (LIVALO) 4  MG TABS Take 1 tablet (4 mg total) by mouth every other day. (Patient taking differently: Take 2 mg by mouth daily. ) 21 tablet 0  . [DISCONTINUED] calcium citrate-vitamin D 200-200 MG-UNIT TABS Take 1 tablet by mouth daily.       No current facility-administered medications on file prior to visit.    Allergies  Allergen Reactions  . Statins Other (See Comments)    'discomfort, aching everywhere' Tolerates to livalo   . Zetia [Ezetimibe] Other (See Comments)    Leg pain  . Zocor [Simvastatin] Other (See Comments)    Leg pain   Family History  Problem Relation Age of Onset  . Hypertension Father   . Hip fracture Father   . COPD Mother   . Cancer Maternal Aunt   . Diabetes Maternal Aunt   . Diabetes Maternal Uncle   . Colon cancer Neg Hx     PE: BP 132/76   Pulse 65   Ht 5\' 3"  (1.6 m)   Wt 167 lb (75.8 kg)   SpO2 96%   BMI 29.58 kg/m  Wt Readings from Last 3 Encounters:  08/11/17 167 lb (75.8 kg)  06/22/17 170 lb 6.4 oz (77.3 kg)  06/15/17 171 lb (77.6 kg)   Constitutional: overweight, in NAD Eyes: PERRLA, EOMI, no exophthalmos ENT: moist mucous membranes, no thyromegaly, no cervical lymphadenopathy Cardiovascular: RRR, No MRG Respiratory: CTA B Gastrointestinal: abdomen soft, NT, ND, BS+ Musculoskeletal: no deformities, strength intact in all 4 Skin: moist, warm, no rashes Neurological: no tremor with outstretched hands, DTR normal in all 4  ASSESSMENT: 1. DM2, non-insulin-dependent, uncontrolled, with complications - CKD - PN  2.  Peripheral neuropathy secondary to diabetes  3. Hypercalcemia  PLAN:  1. Patient with relatively recent history of diabetes, previously well controlled on metformin, however, with higher sugars at last visit due to starting Glucerna between meals.  At last visit, I strongly recommended to stop this or at least to use it instead of a meal if she does not have time to eat a proper meal.  We did not change the regimen at that time but  I advised her to let me know if her sugars remain high, in that case I suggested to start the low-dose glipizide before dinner, since she could not afford a DPP 4 inhibitor.  I also recommended at last visit to switch to a more plant-based diet to reduce her insulin resistance. - At this visit, her sugars improved since last visit, but they are still above goal at bedtime and in a.m. - I recommended to add a low-dose glipizide before large dinner  - otherwise, we will continue with the Metformin ER  - we again discussed about the concept of insulin resistance and I recommended changes in her diet and given her references; I also gave her specific examples about what to eat for breakfast as she mentioned that this was her more problematic meal - I suggested to:  Patient Instructions  Please continue: - Metformin ER 500 mg in am and 1000 mg with dinner   Please start: - Glipizide  2.5 mg 15-30 min before a larger dinner.  Please look up: Rip Esselstyn - The Engine 2 diet   Rip Esselstyn - The Engine 2 7-day Rescue diet  Please return in 3 months with your sugar log.   - Reviewed latest HbA1c, which was higher, 7% 1.5 months - continue checking sugars at different times of the day - check 1x a day, rotating checks - advised for yearly eye exams >> she is UTD - Return to clinic in 3 mo with sugar log    2.  Peripheral neuropathy secondary to diabetes - stable -stopped Neurontin 2/2 concerns for weight gain -continues on alpha lipoic acid and B complex with good results  3. Hypercalcemia -mild -Latest calcium levels were normal - reviewed with the patient  -She had suppressed PTH and normal vit D at last checks -possibly was 2/2 HCTZ - pt is now off the med  Philemon Kingdom, MD PhD [facility] Endocrinology

## 2017-08-11 NOTE — Patient Instructions (Addendum)
Please continue: - Metformin ER 500 mg in am and 1000 mg with dinner   Please start: - Glipizide 2.5 mg 15-30 min before a larger dinner.  Please look up: Rip Esselstyn - The Engine 2 diet   Rip Esselstyn - The Engine 2 7-day Rescue diet  Please return in 3 months with your sugar log.

## 2017-08-15 DIAGNOSIS — D638 Anemia in other chronic diseases classified elsewhere: Secondary | ICD-10-CM | POA: Diagnosis not present

## 2017-08-15 DIAGNOSIS — E876 Hypokalemia: Secondary | ICD-10-CM | POA: Diagnosis not present

## 2017-08-15 DIAGNOSIS — I10 Essential (primary) hypertension: Secondary | ICD-10-CM | POA: Diagnosis not present

## 2017-08-15 DIAGNOSIS — N184 Chronic kidney disease, stage 4 (severe): Secondary | ICD-10-CM | POA: Diagnosis not present

## 2017-09-12 ENCOUNTER — Other Ambulatory Visit: Payer: Self-pay | Admitting: Family Medicine

## 2017-09-12 NOTE — Telephone Encounter (Signed)
last seen 06/15/17  DWM

## 2017-10-04 ENCOUNTER — Encounter: Payer: Self-pay | Admitting: Family Medicine

## 2017-10-04 ENCOUNTER — Other Ambulatory Visit: Payer: Self-pay | Admitting: Dermatology

## 2017-10-04 DIAGNOSIS — D2339 Other benign neoplasm of skin of other parts of face: Secondary | ICD-10-CM | POA: Diagnosis not present

## 2017-10-04 DIAGNOSIS — L814 Other melanin hyperpigmentation: Secondary | ICD-10-CM | POA: Diagnosis not present

## 2017-10-04 DIAGNOSIS — D485 Neoplasm of uncertain behavior of skin: Secondary | ICD-10-CM | POA: Diagnosis not present

## 2017-10-04 DIAGNOSIS — D229 Melanocytic nevi, unspecified: Secondary | ICD-10-CM | POA: Diagnosis not present

## 2017-10-04 DIAGNOSIS — L720 Epidermal cyst: Secondary | ICD-10-CM | POA: Diagnosis not present

## 2017-10-05 ENCOUNTER — Other Ambulatory Visit: Payer: Self-pay | Admitting: *Deleted

## 2017-10-05 DIAGNOSIS — K219 Gastro-esophageal reflux disease without esophagitis: Secondary | ICD-10-CM

## 2017-10-05 DIAGNOSIS — E559 Vitamin D deficiency, unspecified: Secondary | ICD-10-CM

## 2017-10-05 DIAGNOSIS — E119 Type 2 diabetes mellitus without complications: Secondary | ICD-10-CM

## 2017-10-05 DIAGNOSIS — E039 Hypothyroidism, unspecified: Secondary | ICD-10-CM

## 2017-10-05 DIAGNOSIS — N183 Chronic kidney disease, stage 3 (moderate): Secondary | ICD-10-CM

## 2017-10-05 DIAGNOSIS — N1832 Chronic kidney disease, stage 3b: Secondary | ICD-10-CM

## 2017-10-05 DIAGNOSIS — I10 Essential (primary) hypertension: Secondary | ICD-10-CM

## 2017-10-05 DIAGNOSIS — E781 Pure hyperglyceridemia: Secondary | ICD-10-CM

## 2017-10-06 ENCOUNTER — Other Ambulatory Visit: Payer: Self-pay | Admitting: *Deleted

## 2017-10-06 MED ORDER — ACCU-CHEK SOFTCLIX LANCETS MISC: 3 refills | Status: DC

## 2017-10-07 ENCOUNTER — Other Ambulatory Visit: Payer: Medicare Other

## 2017-10-07 DIAGNOSIS — E559 Vitamin D deficiency, unspecified: Secondary | ICD-10-CM | POA: Diagnosis not present

## 2017-10-07 DIAGNOSIS — E039 Hypothyroidism, unspecified: Secondary | ICD-10-CM

## 2017-10-07 DIAGNOSIS — N183 Chronic kidney disease, stage 3 (moderate): Secondary | ICD-10-CM

## 2017-10-07 DIAGNOSIS — E119 Type 2 diabetes mellitus without complications: Secondary | ICD-10-CM | POA: Diagnosis not present

## 2017-10-07 DIAGNOSIS — I10 Essential (primary) hypertension: Secondary | ICD-10-CM

## 2017-10-07 DIAGNOSIS — E781 Pure hyperglyceridemia: Secondary | ICD-10-CM | POA: Diagnosis not present

## 2017-10-07 DIAGNOSIS — K219 Gastro-esophageal reflux disease without esophagitis: Secondary | ICD-10-CM

## 2017-10-07 DIAGNOSIS — N1832 Chronic kidney disease, stage 3b: Secondary | ICD-10-CM

## 2017-10-07 LAB — BAYER DCA HB A1C WAIVED: HB A1C (BAYER DCA - WAIVED): 6.2 % (ref <7.0)

## 2017-10-08 LAB — CBC WITH DIFFERENTIAL/PLATELET
BASOS: 1 %
Basophils Absolute: 0 10*3/uL (ref 0.0–0.2)
EOS (ABSOLUTE): 0.2 10*3/uL (ref 0.0–0.4)
EOS: 3 %
HEMATOCRIT: 40.9 % (ref 34.0–46.6)
Hemoglobin: 13.8 g/dL (ref 11.1–15.9)
IMMATURE GRANS (ABS): 0.1 10*3/uL (ref 0.0–0.1)
IMMATURE GRANULOCYTES: 1 %
LYMPHS: 34 %
Lymphocytes Absolute: 2.2 10*3/uL (ref 0.7–3.1)
MCH: 30.8 pg (ref 26.6–33.0)
MCHC: 33.7 g/dL (ref 31.5–35.7)
MCV: 91 fL (ref 79–97)
MONOS ABS: 0.5 10*3/uL (ref 0.1–0.9)
Monocytes: 8 %
NEUTROS ABS: 3.5 10*3/uL (ref 1.4–7.0)
Neutrophils: 53 %
Platelets: 253 10*3/uL (ref 150–379)
RBC: 4.48 x10E6/uL (ref 3.77–5.28)
RDW: 13.8 % (ref 12.3–15.4)
WBC: 6.5 10*3/uL (ref 3.4–10.8)

## 2017-10-08 LAB — BMP8+EGFR
BUN / CREAT RATIO: 18 (ref 12–28)
BUN: 23 mg/dL (ref 8–27)
CALCIUM: 9.8 mg/dL (ref 8.7–10.3)
CO2: 23 mmol/L (ref 20–29)
Chloride: 102 mmol/L (ref 96–106)
Creatinine, Ser: 1.27 mg/dL — ABNORMAL HIGH (ref 0.57–1.00)
GFR, EST AFRICAN AMERICAN: 46 mL/min/{1.73_m2} — AB (ref >59)
GFR, EST NON AFRICAN AMERICAN: 40 mL/min/{1.73_m2} — AB (ref >59)
Glucose: 127 mg/dL — ABNORMAL HIGH (ref 65–99)
Potassium: 5 mmol/L (ref 3.5–5.2)
Sodium: 141 mmol/L (ref 134–144)

## 2017-10-08 LAB — HEPATIC FUNCTION PANEL
ALBUMIN: 4.3 g/dL (ref 3.5–4.7)
ALK PHOS: 81 IU/L (ref 39–117)
ALT: 13 IU/L (ref 0–32)
AST: 16 IU/L (ref 0–40)
Bilirubin Total: <0.2 mg/dL (ref 0.0–1.2)
Bilirubin, Direct: 0.08 mg/dL (ref 0.00–0.40)
TOTAL PROTEIN: 6.9 g/dL (ref 6.0–8.5)

## 2017-10-08 LAB — THYROID PANEL WITH TSH
FREE THYROXINE INDEX: 2.7 (ref 1.2–4.9)
T3 UPTAKE RATIO: 30 % (ref 24–39)
T4, Total: 9.1 ug/dL (ref 4.5–12.0)
TSH: 1.4 u[IU]/mL (ref 0.450–4.500)

## 2017-10-08 LAB — LIPID PANEL
CHOLESTEROL TOTAL: 260 mg/dL — AB (ref 100–199)
Chol/HDL Ratio: 6 ratio — ABNORMAL HIGH (ref 0.0–4.4)
HDL: 43 mg/dL (ref >39)
LDL CALC: 150 mg/dL — AB (ref 0–99)
TRIGLYCERIDES: 334 mg/dL — AB (ref 0–149)
VLDL CHOLESTEROL CAL: 67 mg/dL — AB (ref 5–40)

## 2017-10-08 LAB — VITAMIN D 25 HYDROXY (VIT D DEFICIENCY, FRACTURES): VIT D 25 HYDROXY: 32.6 ng/mL (ref 30.0–100.0)

## 2017-10-12 ENCOUNTER — Ambulatory Visit (INDEPENDENT_AMBULATORY_CARE_PROVIDER_SITE_OTHER): Payer: Medicare Other | Admitting: Family Medicine

## 2017-10-12 ENCOUNTER — Encounter: Payer: Self-pay | Admitting: Family Medicine

## 2017-10-12 VITALS — BP 120/71 | HR 65 | Temp 97.5°F | Ht 63.0 in | Wt 165.0 lb

## 2017-10-12 DIAGNOSIS — Z789 Other specified health status: Secondary | ICD-10-CM

## 2017-10-12 DIAGNOSIS — E119 Type 2 diabetes mellitus without complications: Secondary | ICD-10-CM | POA: Diagnosis not present

## 2017-10-12 DIAGNOSIS — I10 Essential (primary) hypertension: Secondary | ICD-10-CM

## 2017-10-12 DIAGNOSIS — E039 Hypothyroidism, unspecified: Secondary | ICD-10-CM

## 2017-10-12 DIAGNOSIS — G609 Hereditary and idiopathic neuropathy, unspecified: Secondary | ICD-10-CM

## 2017-10-12 DIAGNOSIS — H6123 Impacted cerumen, bilateral: Secondary | ICD-10-CM | POA: Diagnosis not present

## 2017-10-12 DIAGNOSIS — K219 Gastro-esophageal reflux disease without esophagitis: Secondary | ICD-10-CM | POA: Diagnosis not present

## 2017-10-12 DIAGNOSIS — N183 Chronic kidney disease, stage 3 (moderate): Secondary | ICD-10-CM | POA: Diagnosis not present

## 2017-10-12 DIAGNOSIS — E781 Pure hyperglyceridemia: Secondary | ICD-10-CM

## 2017-10-12 DIAGNOSIS — N1832 Chronic kidney disease, stage 3b: Secondary | ICD-10-CM

## 2017-10-12 DIAGNOSIS — E559 Vitamin D deficiency, unspecified: Secondary | ICD-10-CM | POA: Diagnosis not present

## 2017-10-12 NOTE — Patient Instructions (Addendum)
Medicare Annual Wellness Visit  [facility] and the medical providers at [facility] strive to bring you the best medical care.  In doing so we not only want to address your current medical conditions and concerns but also to detect new conditions early and prevent illness, disease and health-related problems.    Medicare offers a yearly Wellness Visit which allows our clinical staff to assess your need for preventative services including immunizations, lifestyle education, counseling to decrease risk of preventable diseases and screening for fall risk and other medical concerns.    This visit is provided free of charge (no copay) for all Medicare recipients. The clinical pharmacists at [facility] have begun to conduct these Wellness Visits which will also include a thorough review of all your medications.    As you primary medical provider recommend that you make an appointment for your Annual Wellness Visit if you have not done so already this year.  You may set up this appointment before you leave today or you may call back ([phone]) and schedule an appointment.  Please make sure when you call that you mention that you are scheduling your Annual Wellness Visit with the clinical pharmacist so that the appointment may be made for the proper length of time.     Continue current medications. Continue good therapeutic lifestyle changes which include good diet and exercise. Fall precautions discussed with patient. If an FOBT was given today- please return it to our front desk. If you are over 2 years old - you may need Prevnar 33 or the adult Pneumonia vaccine.  **Flu shots are available--- please call and schedule a FLU-CLINIC appointment**  After your visit with [place] today you will receive a survey in the mail or online from [employer] regarding your care with [place]. Please take a moment to fill this out. Your feedback is very  important to [place] as you can help [place] better understand your patient needs as well as improve your experience and satisfaction. WE CARE ABOUT YOU!!!   Follow-up with endocrinologist as planned Restart Livalo at least 2 mg daily Continue to watch diet closely and stay active physically with all efforts to lose weight through diet and exercise Keep follow-up appointment with gastroenterologist Drink at least 6 bottles of water daily and see if this makes any difference with the burning in your feet at nighttime Also discussed this with the endocrinologist for her suggestions Take a copy of the lab work with you to that visit

## 2017-10-12 NOTE — Progress Notes (Signed)
Subjective:    Patient ID: Lisa Sutton, female    DOB: 05-14-1934, 84 y.o.   MRN: [id number]  HPI Pt here for follow up and management of chronic medical problems which includes diabetes and hypertension. She is taking medication regularly.  This patient is a diabetic and complains today with bilateral foot pain.  She has had lab work done and this will be reviewed with her during the visit today.  Cholesterol numbers with traditional lipid testing were very much out of line with a total cholesterol being 260 triglycerides being 334 the LDL C cholesterol being elevated at 150.  She is supposed to be taking Livalo we will confirm that with her when we see her.  All thyroid tests were normal.  All liver function tests were normal.  The vitamin D level was at the low end of the normal range at 32.6.  The CBC was within normal limits with a normal white blood cell count a good and stable hemoglobin at 13.8 and an adequate platelet count.  The blood sugar was elevated at 127.  The creatinine, was elevated at 1.27 and slightly lower than it was 4 months ago when it was 1.43.  All of the electrolytes including sodium potassium and chloride and calcium were normal.  The hemoglobin A1c was actually improved from the last check and is now 6.2%.   Patient Active Problem List   Diagnosis Date Noted  . Chronic kidney disease (CKD) stage G3b/A1, moderately decreased glomerular filtration rate (GFR) between 30-44 mL/min/1.73 square meter and albuminuria creatinine ratio less than 30 mg/g (HCC) 06/15/2017  . Diarrhea 05/09/2017  . History of colonic polyps 05/09/2017  . H/O hypercalcemia 04/12/2017  . Upper airway cough syndrome 09/14/2016  . Hemoptysis 07/16/2016  . Acute pain of right knee 06/02/2016  . Hypothyroidism due to acquired atrophy of thyroid 04/26/2016  . Vitamin D deficiency 04/26/2016  . Status post total left knee replacement 09/15/2015  . Statin intolerance 09/11/2015  . Peripheral  neuropathy 07/30/2015  . Abnormal chest x-ray 11/27/2014  . Protein-calorie malnutrition, severe ([place]) 11/27/2014  . AKI (acute kidney injury) ([place]) 11/26/2014  . Hypercalcemia 11/26/2014  . Leukocytosis 11/26/2014  . Weight loss 11/26/2014  . Weakness 11/26/2014  . Insomnia 04/12/2013  . Hyperlipidemia 04/12/2013  . Essential hypertension, benign 10/13/2012  . GERD (gastroesophageal reflux disease) 04/25/2012  . Fatty liver 08/09/2011  . Hepatitis B 08/09/2011  . Dysphagia 08/09/2011  . Type 2 diabetes mellitus with diabetic nephropathy, without long-term current use of insulin ([place]) 08/09/2011   Outpatient Encounter Medications as of 10/12/2017  Medication Sig  . ACCU-CHEK AVIVA PLUS test strip CHECK BLOOD SUGAR TWO TIMES DAILY  . ACCU-CHEK SOFTCLIX LANCETS lancets Check BS BID  . acetaminophen (TYLENOL) 500 MG tablet Take 1,000 mg by mouth daily as needed for moderate pain or headache.  . Alpha-Lipoic Acid 600 MG CAPS Take 600 mg by mouth 2 (two) times daily.  Marland Kitchen ALPRAZolam (XANAX) 0.25 MG tablet Take 1 tablet (0.25 mg total) by mouth 2 (two) times daily as needed for anxiety. (Patient taking differently: Take 0.25 mg by mouth at bedtime as needed for sleep. )  . amLODipine (NORVASC) 5 MG tablet TAKE 1 TABLET BY MOUTH  DAILY  . aspirin EC 81 MG tablet Take 1 tablet (81 mg total) by mouth every evening.  Marland Kitchen atenolol (TENORMIN) 50 MG tablet Take 1 tablet (50 mg total) by mouth daily.  . Cholecalciferol (VITAMIN D3) 1000 units CAPS Take  1,000 Units by mouth daily.   . irbesartan (AVAPRO) 150 MG tablet Take 1 tablet (150 mg total) by mouth daily. (Patient taking differently: Take 150 mg by mouth every evening. )  . levothyroxine (SYNTHROID, LEVOTHROID) 112 MCG tablet TAKE 1 TABLET BY MOUTH  DAILY  . levothyroxine (SYNTHROID, LEVOTHROID) 25 MCG tablet TAKE 1 TABLET BY MOUTH  DAILY BEFORE BREAKFAST (Patient taking differently: Take 48mcg on Mondays,Wednesday and Fridays with the 112mg   levothyroxine)  . LYRICA 50 MG capsule TAKE  (1)  CAPSULE  TWICE DAILY.  . metFORMIN (GLUCOPHAGE-XR) 500 MG 24 hr tablet Take 1 tablet in am and 2 tablet with dinner.  . Multiple Vitamin (MULTIVITAMIN WITH MINERALS) TABS tablet Take 1 tablet by mouth daily.  Marland Kitchen OVER THE COUNTER MEDICATION Apply 1 application topically daily as needed (foot pain). Magnilife otc pain reliever cream  . pantoprazole (PROTONIX) 40 MG tablet Take 1 tablet (40 mg total) by mouth daily before breakfast. (Patient taking differently: Take 40 mg by mouth as needed (acid reflux). )  . Pitavastatin Calcium (LIVALO) 4 MG TABS Take 1 tablet (4 mg total) by mouth every other day. (Patient taking differently: Take 2 mg by mouth daily. )  . [DISCONTINUED] calcium citrate-vitamin D 200-200 MG-UNIT TABS Take 1 tablet by mouth daily.    . [DISCONTINUED] glipiZIDE (GLUCOTROL) 5 MG tablet Take 0.5 tablets (2.5 mg total) by mouth daily before supper.   No facility-administered encounter medications on file as of 10/12/2017.      Review of Systems  Constitutional: Negative.   HENT: Negative.   Eyes: Negative.   Respiratory: Negative.   Cardiovascular: Negative.   Gastrointestinal: Negative.   Endocrine: Negative.   Genitourinary: Negative.   Musculoskeletal: Positive for arthralgias (bilateral foot pain ).  Skin: Negative.   Allergic/Immunologic: Negative.   Neurological: Negative.   Hematological: Negative.   Psychiatric/Behavioral: Negative.        Objective:   Physical Exam  Constitutional: She is oriented to person, place, and time. She appears well-developed and well-nourished. No distress.  The patient looks great and feels great other than the burning in her feet when she lays down to go to the bed at nighttime.  She does not have any back pain.  Is not related to activity.  HENT:  Head: Normocephalic and atraumatic.  Mouth/Throat: Oropharynx is clear and moist.  Nasal turbinate congestion bilaterally unable to  visualize eardrums because of bilateral cerumen.  Eyes: Pupils are equal, round, and reactive to light. Conjunctivae and EOM are normal. Right eye exhibits no discharge. Left eye exhibits no discharge.  Neck: Normal range of motion. Neck supple. No thyromegaly present.  No bruits thyromegaly or anterior cervical adenopathy  Cardiovascular: Normal rate, regular rhythm and normal heart sounds.  No murmur heard. The heart was regular at 60/min without murmur.  Decreased pedal pulses bilaterally.  Pulmonary/Chest: Effort normal and breath sounds normal. She has no wheezes. She has no rales.  Clear anteriorly and posteriorly  Abdominal: Soft. Bowel sounds are normal. She exhibits no mass. There is no tenderness.  No liver or spleen enlargement.  No epigastric tenderness no suprapubic tenderness no masses and no bruits  Musculoskeletal: Normal range of motion. She exhibits no edema.  Lymphadenopathy:    She has no cervical adenopathy.  Neurological: She is alert and oriented to person, place, and time. She has normal reflexes. No cranial nerve deficit.  Skin: Skin is warm and dry. No rash noted.  Psychiatric: She has a normal  mood and affect. Her behavior is normal. Judgment and thought content normal.  Normal mood affect and behavior  Nursing note and vitals reviewed.    BP 120/71 (BP Location: Left Arm)   Pulse 65   Temp (!) 97.5 F (36.4 C) (Oral)   Ht 5\' 3"  (1.6 m)   Wt 165 lb (74.8 kg)   BMI 29.23 kg/m   Bilateral ear irrigation to remove cerumen without problems    Assessment & Plan:  1. Type 2 diabetes mellitus without complication, without long-term current use of insulin (HCC) -Continue to follow-up with endocrinologist as A1c's are much improved and she should continue with current treatment  2. Hypothyroidism, unspecified type -Continue with current thyroid replacement as current numbers are good.  3. Essential hypertension, benign -The blood pressure is good and she  will continue with current treatment  4. Hypertriglyceridemia -Triglycerides and cholesterol are both elevated but she has not been taking the Livalo which is the only statin that she can tolerate.  We will give her another prescription for this and have her given some samples to try also to cut the expense  5. Vitamin D deficiency -Continue with vitamin D replacement  6. Gastroesophageal reflux disease without esophagitis -Continue with Protonix  7. Statin intolerance -She has been able to tolerate Livalo but has not been taking this and her cholesterol numbers are much higher this time.  She will restart the Livalo at 2 mg daily.  We will give her a new prescription and some samples of the 4 mg size so she can cut these half and 2.  8. Chronic kidney disease (CKD) stage G3b/A1, moderately decreased glomerular filtration rate (GFR) between 30-44 mL/min/1.73 square meter and albuminuria creatinine ratio less than 30 mg/g (HCC) -The creatinine remains elevated but is improved from previously.  We will continue to monitor this.  9. Hypercalcemia -The serum calcium was good and we will continue to monitor this.  10. Idiopathic peripheral neuropathy -Not sure of the cause of this.  She will continue to drink plenty of water and stay as active as possible.  11. Excessive cerumen in ear canal, bilateral -Ear irrigation to remove cerumen  No orders of the defined types were placed in this encounter.  Patient Instructions                       Medicare Annual Wellness Visit  [facility] and the medical providers at [facility] strive to bring you the best medical care.  In doing so we not only want to address your current medical conditions and concerns but also to detect new conditions early and prevent illness, disease and health-related problems.    Medicare offers a yearly Wellness Visit which allows our clinical staff to assess your need for preventative services  including immunizations, lifestyle education, counseling to decrease risk of preventable diseases and screening for fall risk and other medical concerns.    This visit is provided free of charge (no copay) for all Medicare recipients. The clinical pharmacists at [facility] have begun to conduct these Wellness Visits which will also include a thorough review of all your medications.    As you primary medical provider recommend that you make an appointment for your Annual Wellness Visit if you have not done so already this year.  You may set up this appointment before you leave today or you may call back ([phone]) and schedule an appointment.  Please make  sure when you call that you mention that you are scheduling your Annual Wellness Visit with the clinical pharmacist so that the appointment may be made for the proper length of time.     Continue current medications. Continue good therapeutic lifestyle changes which include good diet and exercise. Fall precautions discussed with patient. If an FOBT was given today- please return it to our front desk. If you are over 73 years old - you may need Prevnar 66 or the adult Pneumonia vaccine.  **Flu shots are available--- please call and schedule a FLU-CLINIC appointment**  After your visit with [place] today you will receive a survey in the mail or online from [employer] regarding your care with [place]. Please take a moment to fill this out. Your feedback is very important to [place] as you can help [place] better understand your patient needs as well as improve your experience and satisfaction. WE CARE ABOUT YOU!!!   Follow-up with endocrinologist as planned Restart Livalo at least 2 mg daily Continue to watch diet closely and stay active physically with all efforts to lose weight through diet and exercise Keep follow-up appointment with gastroenterologist Drink at least 6 bottles of water daily and see if this makes any difference with the  burning in your feet at nighttime Also discussed this with the endocrinologist for her suggestions Take a copy of the lab work with you to that visit  Arrie Senate MD

## 2017-10-19 ENCOUNTER — Telehealth: Payer: Self-pay | Admitting: *Deleted

## 2017-10-19 ENCOUNTER — Ambulatory Visit: Payer: Medicare Other | Admitting: Internal Medicine

## 2017-10-19 ENCOUNTER — Encounter: Payer: Self-pay | Admitting: Family Medicine

## 2017-10-19 ENCOUNTER — Other Ambulatory Visit: Payer: Self-pay | Admitting: Family Medicine

## 2017-10-19 ENCOUNTER — Encounter: Payer: Self-pay | Admitting: Internal Medicine

## 2017-10-19 VITALS — BP 126/72 | HR 68 | Ht 63.0 in | Wt 162.0 lb

## 2017-10-19 DIAGNOSIS — R197 Diarrhea, unspecified: Secondary | ICD-10-CM | POA: Diagnosis not present

## 2017-10-19 DIAGNOSIS — G63 Polyneuropathy in diseases classified elsewhere: Secondary | ICD-10-CM

## 2017-10-19 DIAGNOSIS — E1121 Type 2 diabetes mellitus with diabetic nephropathy: Secondary | ICD-10-CM

## 2017-10-19 DIAGNOSIS — E034 Atrophy of thyroid (acquired): Secondary | ICD-10-CM

## 2017-10-19 NOTE — Telephone Encounter (Signed)
Start with Cymbalta 30 mg once daily at bedtime.  I am not sure where this recommendation came from initially but the patient told me about this at her last visit here and someone had suggested to her that she might try this and I think it is a good recommendation but I am not sure where the original suggestion came from and if you can find that out from the patient that would be great.

## 2017-10-19 NOTE — Patient Instructions (Signed)
Please decrease: - Metformin ER to 500 mg in am and 500 mg with dinner  Please use: - Glipizide 2.5 mg only before a larger dinner or if you have dessert  Please return in 4 months with your sugar log.

## 2017-10-19 NOTE — Progress Notes (Signed)
Patient ID: Lisa Sutton, female   DOB: Feb 05, 1934, 84 y.o.   MRN: [id number]   HPI: Lisa Sutton is a 84 y.o.-year-old female whom I previously saw for hypercalcemia in 2016, now returning for DM2 (new condition for me), dx in 2015, non-insulin-dependent, uncontrolled, with complications (CKD, peripheral neuropathy).  Last visit 2 months ago.  She continues to go to [facility] at the [facility] 3X a week  Last hemoglobin A1c was: 10/07/2017: HbA1c 6.2% 06/10/2017: HbA1c 7.0% 01/2017: HbA1c 6.1%  Lab Results  Component Value Date   HGBA1C 6.2 (H) 09/03/2015   HGBA1C 5.9 05/27/2015   HGBA1C 6.1 03/13/2015   Pt was on a regimen of: - Glyburide 2.5 mg before b'fast - started this after she stopped Metformin in 01/2017 - but she developed symptomatic hypoglycemic episodes and she had to stop Could not tolerate regular metformin due to diarrhea. She could not afford Januvia.  At last visit, we started: - Metformin ER 500 mg in am and 1000 mg with dinner  >> some diarrhea - Glipizide 2.5 mg before a large dinner - Cinnamon 2 tablets in am >> started since last visit  Pt checks her sugars 2-4X a day - am: 118-146 >> 155-200 >> 140-168 >> 107-132, 150, 161 - 2h after b'fast: n/c >> 141-252 >> 122-145 >> n/c - before lunch:  111-143, 183 >> 100-136 >> n/c - 2h after lunch:  90-145 >> 109, 170, 185 >> n/c - before dinner: 75-195, 208 >> 109 >> 90-127 >> n/c - 2h after dinner: 132-251, 272 >> 105-145 >> 73, 89, 120-169 - bedtime:  123-220, 157 >> 129, 208, 222 >> 89-126 - nighttime: n/c >> 129, 150 Lowest sugar was 61 >> 90 >> 100 >> 73; she has hypoglycemia awareness in the 70s.  Highest sugar was 222 >> 180.  Glucometer: Accu-Chek Aviva  Pt's meals are: - Breakfast: 2 boiled eggs + toast + tomato or croissants + am and egg - Lunch: [place] sandwich + lettuce + tomato + mayo - Dinner: green beans, slaw + potatoes + meatloaf   - Snacks: crackers + PB Drinks diet green  tea.  -+ CKD -sees nephrology in [place], last BUN/creatinine:  Lab Results  Component Value Date   BUN 23 10/07/2017   BUN 25 06/10/2017   CREATININE 1.27 (H) 10/07/2017   CREATININE 1.43 (H) 06/10/2017  On irbesartan.  - + HL; last set of lipids: Lab Results  Component Value Date   CHOL 260 (H) 10/07/2017   HDL 43 10/07/2017   LDLCALC 150 (H) 10/07/2017   LDLDIRECT 99 11/01/2016   TRIG 334 (H) 10/07/2017   CHOLHDL 6.0 (H) 10/07/2017  On Livalo (restarted on this after last Lipid panel). - last eye exam was in 09/2016: No DR - + numbness and tingling in her feet.  Previously on Neurontin, which she stopped due to weight gain; now on B complex and ALA.  Stopped ASA 81.  Hypothyroidism: - On levothyroxine 112 mcg daily + an extra 25 mcg MWF  Reviewed latest TSH and this was normal: Lab Results  Component Value Date   TSH 1.400 10/07/2017   She has a history of secondary (non-PTH mediated) hypercalcemia due to HCTZ, which normalized after stopping the medication:  Lab Results  Component Value Date   PTH 11 (L) 06/04/2015   PTH Comment 06/04/2015   PTH 11 (L) 05/29/2015   PTH Comment 05/29/2015   PTH 6 (L) 01/13/2015   PTH Comment 01/13/2015  PTH 12 (L) 12/02/2014   PTH 5 (L) 11/26/2014   CALCIUM 9.8 10/07/2017   CALCIUM 9.6 06/10/2017   CALCIUM 9.4 01/27/2017   CALCIUM 10.7 (H) 01/21/2017   CALCIUM 10.2 11/01/2016   CALCIUM 9.5 09/03/2016   CALCIUM 9.7 04/21/2016   CALCIUM 9.3 09/18/2015   CALCIUM 8.9 09/17/2015   CALCIUM 8.6 (L) 09/16/2015   CALCIUM 9.6 09/08/2015   CALCIUM 10.5 (H) 09/03/2015   CALCIUM 9.3 06/04/2015   CALCIUM CANCELED 05/29/2015   CALCIUM CANCELED 05/27/2015   CALCIUM 11.0 (H) 04/28/2015   CALCIUM 11.8 (H) 04/26/2015   CALCIUM 10.2 03/13/2015   CALCIUM 10.5 (H) 02/17/2015   CALCIUM 11.9 (H) 02/07/2015   Latest vitamin D recently normal: Lab Results  Component Value Date   VD25OH 32.6 10/07/2017   VD25OH 38.7 06/10/2017    VD25OH 40.5 01/21/2017   VD25OH 32.9 09/03/2016   VD25OH 63.1 05/27/2015   VD25OH 37.8 03/13/2015   VD25OH 41.1 02/17/2015   VD25OH 53.3 10/17/2014   VD25OH 24.9 (L) 06/19/2014   VD25OH 23.4 (L) 03/11/2014   She takes vitamin D 1000 units daily.  ROS: Constitutional: no weight gain/no weight loss, no fatigue, no subjective hyperthermia, no subjective hypothermia Eyes: no blurry vision, no xerophthalmia ENT: no sore throat, no nodules palpated in throat, no dysphagia, no odynophagia, no hoarseness Cardiovascular: no CP/no SOB/no palpitations/no leg swelling Respiratory: no cough/no SOB/no wheezing Gastrointestinal: no N/no V/no D/no C/no acid reflux Musculoskeletal: no muscle aches/no joint aches Skin: no rashes, no hair loss Neurological: no tremors/+ numbness/+ tingling/no dizziness  I reviewed pt's medications, allergies, PMH, social hx, family hx, and changes were documented in the history of present illness. Otherwise, unchanged from my initial visit note.  Past Medical History:  Diagnosis Date  . Anxiety   . Arthritis   . Cataract   . Dysphagia    'sometimes but not a major issue' been checked out by GI (per pt)  . Dysrhythmia    'heart used to skip but doesn't anymore' was checked out by Dr. Ron Parker late '90s, everything checked out ok and not had any skipping since (all per pt)  . Essential hypertension, benign   . Fatty liver   . GERD (gastroesophageal reflux disease)   . Hepatitis B    had at age 58, 'GI doc said it's gone away'  . Hepatitis B surface antigen positive   . History of hiatal hernia   . Hypercalcemia   . Hypothyroidism   . Kidney stone August 2014   Patient was [facility] at [facility]  . Obesity   . Osteoporosis   . PONV (postoperative nausea and vomiting)   . Type 2 diabetes mellitus ([place])    Past Surgical History:  Procedure Laterality Date  . ABDOMINAL HYSTERECTOMY    . BILATERAL CARPAL TUNNEL RELEASE     Dr. Marlou Sa at surgical center  . BIOPSY   05/26/2017   Procedure: BIOPSY;  Surgeon: Rogene Houston, MD;  [facility];  Service: Endoscopy;;  colon  . CATARACT EXTRACTION, BILATERAL    . COLONOSCOPY N/A 05/26/2017   Procedure: COLONOSCOPY;  Surgeon: Rogene Houston, MD;  [facility];  Service: Endoscopy;  Laterality: N/A;  10:30  . Deviated septum repair    . ESOPHAGOGASTRODUODENOSCOPY N/A 07/28/2016   Procedure: ESOPHAGOGASTRODUODENOSCOPY (EGD);  Surgeon: Rogene Houston, MD;  [facility];  Service: Endoscopy;  Laterality: N/A;  300 - moved to 3/7 @ 1:00  . EYE SURGERY    .  KNEE ARTHROPLASTY Left 09/15/2015   Procedure: COMPUTER ASSISTED TOTAL KNEE ARTHROPLASTY;  Surgeon: Marybelle Killings, MD;  Location: [facility];  Service: Orthopedics;  Laterality: Left;  . KNEE ARTHROSCOPY     left knee 2003   Social History   Socioeconomic History  . Marital status: Widowed    Spouse name: Not on file  . Number of children: Not on file  . Years of education: Not on file  . Highest education level: Not on file  Occupational History  . Not on file  Social Needs  . Financial resource strain: Not on file  . Food insecurity:    Worry: Not on file    Inability: Not on file  . Transportation needs:    Medical: Not on file    Non-medical: Not on file  Tobacco Use  . Smoking status: Never Smoker  . Smokeless tobacco: Never Used  Substance and Sexual Activity  . Alcohol use: No    Alcohol/week: 0.0 oz  . Drug use: No  . Sexual activity: Never  Lifestyle  . Physical activity:    Days per week: Not on file    Minutes per session: Not on file  . Stress: Not on file  Relationships  . Social connections:    Talks on phone: Not on file    Gets together: Not on file    Attends religious service: Not on file    Active member of club or organization: Not on file    Attends meetings of clubs or organizations: Not on file    Relationship status: Not on file  . Intimate partner violence:    Fear of current or ex  partner: Not on file    Emotionally abused: Not on file    Physically abused: Not on file    Forced sexual activity: Not on file  Other Topics Concern  . Not on file  Social History Narrative  . Not on file   Current Outpatient Medications on File Prior to Visit  Medication Sig Dispense Refill  . ACCU-CHEK AVIVA PLUS test strip CHECK BLOOD SUGAR TWO TIMES DAILY 200 each 3  . ACCU-CHEK SOFTCLIX LANCETS lancets Check BS BID 200 each 3  . acetaminophen (TYLENOL) 500 MG tablet Take 1,000 mg by mouth daily as needed for moderate pain or headache.    . Alpha-Lipoic Acid 600 MG CAPS Take 600 mg by mouth 2 (two) times daily.    Marland Kitchen ALPRAZolam (XANAX) 0.25 MG tablet Take 1 tablet (0.25 mg total) by mouth 2 (two) times daily as needed for anxiety. (Patient taking differently: Take 0.25 mg by mouth at bedtime as needed for sleep. ) 180 tablet 1  . amLODipine (NORVASC) 5 MG tablet TAKE 1 TABLET BY MOUTH  DAILY 90 tablet 1  . aspirin EC 81 MG tablet Take 1 tablet (81 mg total) by mouth every evening.    Marland Kitchen atenolol (TENORMIN) 50 MG tablet Take 1 tablet (50 mg total) by mouth daily. 90 tablet 3  . Cholecalciferol (VITAMIN D3) 1000 units CAPS Take 1,000 Units by mouth daily.     . irbesartan (AVAPRO) 150 MG tablet Take 1 tablet (150 mg total) by mouth daily. (Patient taking differently: Take 150 mg by mouth every evening. ) 90 tablet 3  . levothyroxine (SYNTHROID, LEVOTHROID) 112 MCG tablet TAKE 1 TABLET BY MOUTH  DAILY 90 tablet 3  . levothyroxine (SYNTHROID, LEVOTHROID) 25 MCG tablet TAKE 1 TABLET BY MOUTH  DAILY BEFORE BREAKFAST (Patient taking differently: Take  44mcg on Mondays,Wednesday and Fridays with the 112mg  levothyroxine) 90 tablet 2  . LYRICA 50 MG capsule TAKE  (1)  CAPSULE  TWICE DAILY. 60 capsule 0  . metFORMIN (GLUCOPHAGE-XR) 500 MG 24 hr tablet Take 1 tablet in am and 2 tablet with dinner. 270 tablet 3  . Multiple Vitamin (MULTIVITAMIN WITH MINERALS) TABS tablet Take 1 tablet by mouth daily.     Marland Kitchen OVER THE COUNTER MEDICATION Apply 1 application topically daily as needed (foot pain). Magnilife otc pain reliever cream    . pantoprazole (PROTONIX) 40 MG tablet Take 1 tablet (40 mg total) by mouth daily before breakfast. (Patient taking differently: Take 40 mg by mouth as needed (acid reflux). ) 90 tablet 3  . Pitavastatin Calcium (LIVALO) 4 MG TABS Take 1 tablet (4 mg total) by mouth every other day. (Patient taking differently: Take 2 mg by mouth daily. ) 21 tablet 0  . [DISCONTINUED] calcium citrate-vitamin D 200-200 MG-UNIT TABS Take 1 tablet by mouth daily.       No current facility-administered medications on file prior to visit.    Allergies  Allergen Reactions  . Statins Other (See Comments)    'discomfort, aching everywhere' Tolerates to livalo   . Zetia [Ezetimibe] Other (See Comments)    Leg pain  . Zocor [Simvastatin] Other (See Comments)    Leg pain   Family History  Problem Relation Age of Onset  . Hypertension Father   . Hip fracture Father   . COPD Mother   . Cancer Maternal Aunt   . Diabetes Maternal Aunt   . Diabetes Maternal Uncle   . Colon cancer Neg Hx     PE: BP 126/72 (BP Location: Left Arm, Patient Position: Sitting, Cuff Size: Normal)   Pulse 68   Ht 5\' 3"  (1.6 m)   Wt 162 lb (73.5 kg)   SpO2 93%   BMI 28.70 kg/m  Wt Readings from Last 3 Encounters:  10/19/17 162 lb (73.5 kg)  10/12/17 165 lb (74.8 kg)  08/11/17 167 lb (75.8 kg)   Constitutional: overweight, in NAD Eyes: PERRLA, EOMI, no exophthalmos ENT: moist mucous membranes, no thyromegaly, no cervical lymphadenopathy Cardiovascular: RRR, No MRG Respiratory: CTA B Gastrointestinal: abdomen soft, NT, ND, BS+ Musculoskeletal: no deformities, strength intact in all 4 Skin: moist, warm, no rashes Neurological: no tremor with outstretched hands, DTR normal in all 4  ASSESSMENT: 1. DM2, non-insulin-dependent, uncontrolled, with complications - CKD - PN  2.  Peripheral neuropathy  secondary to diabetes  3. Hypercalcemia  4. Hypothyroidism  PLAN:  1. Patient with relatively recent history of diabetes, previously well controlled on metformin, however, with higher sugars few months ago due to Glucerna between meals.  We stopped the Glucerna and did not change the regimen at that time.  At last visit, her sugars were still above goal after dinner and in the morning, so we added a low-dose glipizide before a larger dinner.  We also discussed about the concept of insulin resistance at that time and I recommended changes in her diet to improve this. - Latest HbA1c was excellent, at 6.2% earlier this month, decreased from 7.0% - sugars at last visit are better. Since her sugars decrease quite significantly after glipizide 2.5 mg before dinner >> will only use this before a larger meal. Will also reduce the Metformin dose at night as she has some diarrhea. Discussed to add a 3rd Metformin dose with lunch if sugars start to worsen - I suggested to:  Patient Instructions  Please decrease: - Metformin ER to 500 mg in am and 500 mg with dinner  Please use: - Glipizide 2.5 mg only before a larger dinner or if you have dessert  Please return in 4 months with your sugar log.   - continue checking sugars at different times of the day - check 1x a day, rotating checks - advised for yearly eye exams >> she is UTD - Return to clinic in 4 mo with sugar log     2.  Peripheral neuropathy secondary to diabetes -Stable -She stopped Neurontin due to concerns for weight gain -Continues with alpha lipoic acid and B complex. She would like to try Cymbalta.  3. Hypercalcemia -mild -Latest calcium levels reviewed with the patient: Normal -She had suppressed PTH normal vitamin D levels at last check -This was possibly secondary to HCTZ-she is now off the medication  4. Hypothyroidism - well controlled. Last TSH: Lab Results  Component Value Date   TSH 1.400 10/07/2017   5.  Diarrhea - will reduce Metformin and if not improving, I recommended to start a probiotic  Philemon Kingdom, MD PhD [facility] Endocrinology

## 2017-10-20 MED ORDER — DULOXETINE HCL 30 MG PO CPEP: 30.0000 mg | ORAL_CAPSULE | Freq: Every day | ORAL | 3 refills | Status: DC

## 2017-10-20 NOTE — Telephone Encounter (Signed)
Start with 30 mg daily with food at bedtime.  Sometimes the medicine can make you a little bit nauseated thus much better to take it with some food

## 2017-10-20 NOTE — Telephone Encounter (Signed)
DR Cruzita Lederer - suggested  Med sent to mail order and pt aware

## 2017-10-24 ENCOUNTER — Encounter: Payer: Self-pay | Admitting: Internal Medicine

## 2017-10-25 DIAGNOSIS — E119 Type 2 diabetes mellitus without complications: Secondary | ICD-10-CM | POA: Diagnosis not present

## 2017-10-25 DIAGNOSIS — Z961 Presence of intraocular lens: Secondary | ICD-10-CM | POA: Diagnosis not present

## 2017-10-25 DIAGNOSIS — H353131 Nonexudative age-related macular degeneration, bilateral, early dry stage: Secondary | ICD-10-CM | POA: Diagnosis not present

## 2017-10-25 LAB — HM DIABETES EYE EXAM

## 2017-11-04 ENCOUNTER — Other Ambulatory Visit: Payer: Self-pay | Admitting: Family Medicine

## 2017-11-07 ENCOUNTER — Other Ambulatory Visit: Payer: Self-pay | Admitting: *Deleted

## 2017-11-07 MED ORDER — ALPRAZOLAM 0.25 MG PO TABS: 0.2500 mg | ORAL_TABLET | Freq: Two times a day (BID) | ORAL | 1 refills | Status: DC | PRN

## 2017-11-14 ENCOUNTER — Encounter: Payer: Self-pay | Admitting: Family Medicine

## 2017-11-30 ENCOUNTER — Other Ambulatory Visit: Payer: Self-pay | Admitting: *Deleted

## 2017-11-30 MED ORDER — PITAVASTATIN CALCIUM 4 MG PO TABS: 4.0000 mg | ORAL_TABLET | ORAL | 1 refills | Status: DC

## 2017-12-15 ENCOUNTER — Encounter (INDEPENDENT_AMBULATORY_CARE_PROVIDER_SITE_OTHER): Payer: Self-pay | Admitting: Internal Medicine

## 2017-12-15 ENCOUNTER — Ambulatory Visit (INDEPENDENT_AMBULATORY_CARE_PROVIDER_SITE_OTHER): Payer: Medicare Other | Admitting: Internal Medicine

## 2017-12-15 VITALS — BP 140/78 | HR 60 | Temp 97.7°F | Ht 63.0 in | Wt 165.8 lb

## 2017-12-15 DIAGNOSIS — R197 Diarrhea, unspecified: Secondary | ICD-10-CM

## 2017-12-15 NOTE — Patient Instructions (Signed)
Get Fiber 4 gm.

## 2017-12-15 NOTE — Progress Notes (Signed)
Subjective:    Patient ID: Lisa Sutton, female    DOB: 09-Mar-1934, 84 y.o.   MRN: [id number]  HPI Presents today with c/o diarrhea. She says she has bouts of diarrhea at list once a week. She takes Imodium to calm it down. She stopped the Metformin, but her diarrhea still occurs. She says her stools are sometime like in piece. She has urgency. Her appetite is okay. No weight loss.  She is exercising three times a weeks.  She has had a recent colonoscopy.  Occasionally has some dysphagia.  (apple).  Does not occur on a regular basis.   Hx of diabetes x 4 years. HA!C 6.2 in May, 2019.  Widowed, 1 child. Good health  05/26/2017 Colonoscopy; diarrhea, Colon polyps;  Diverticulosis, external hemorrhoids.  Negative for Microscopic colitis  Review of Systems Past Medical History:  Diagnosis Date  . Anxiety   . Arthritis   . Cataract   . Dysphagia    'sometimes but not a major issue' been checked out by GI (per pt)  . Dysrhythmia    'heart used to skip but doesn't anymore' was checked out by Dr. Ron Parker late '90s, everything checked out ok and not had any skipping since (all per pt)  . Essential hypertension, benign   . Fatty liver   . GERD (gastroesophageal reflux disease)   . Hepatitis B    had at age 10, 'GI doc said it's gone away'  . Hepatitis B surface antigen positive   . History of hiatal hernia   . Hypercalcemia   . Hypothyroidism   . Kidney stone August 2014   Patient [facility] at [facility]  . Obesity   . Osteoporosis   . PONV (postoperative nausea and vomiting)   . Type 2 diabetes mellitus ([place])     Past Surgical History:  Procedure Laterality Date  . ABDOMINAL HYSTERECTOMY    . BILATERAL CARPAL TUNNEL RELEASE     Dr. Marlou Sa at surgical center  . BIOPSY  05/26/2017   Procedure: BIOPSY;  Surgeon: Rogene Houston, MD;  [facility];  Service: Endoscopy;;  colon  . CATARACT EXTRACTION, BILATERAL    . COLONOSCOPY N/A 05/26/2017   Procedure: COLONOSCOPY;   Surgeon: Rogene Houston, MD;  [facility];  Service: Endoscopy;  Laterality: N/A;  10:30  . Deviated septum repair    . ESOPHAGOGASTRODUODENOSCOPY N/A 07/28/2016   Procedure: ESOPHAGOGASTRODUODENOSCOPY (EGD);  Surgeon: Rogene Houston, MD;  [facility];  Service: Endoscopy;  Laterality: N/A;  300 - moved to 3/7 @ 1:00  . EYE SURGERY    . KNEE ARTHROPLASTY Left 09/15/2015   Procedure: COMPUTER ASSISTED TOTAL KNEE ARTHROPLASTY;  Surgeon: Marybelle Killings, MD;  Location: [place];  Service: Orthopedics;  Laterality: Left;  . KNEE ARTHROSCOPY     left knee 2003    Allergies  Allergen Reactions  . Statins Other (See Comments)    'discomfort, aching everywhere' Tolerates to livalo   . Zetia [Ezetimibe] Other (See Comments)    Leg pain  . Zocor [Simvastatin] Other (See Comments)    Leg pain    Current Outpatient Medications on File Prior to Visit  Medication Sig Dispense Refill  . acetaminophen (TYLENOL) 500 MG tablet Take 1,000 mg by mouth daily as needed for moderate pain or headache.    . ALPRAZolam (XANAX) 0.25 MG tablet Take 1 tablet (0.25 mg total) by mouth 2 (two) times daily as needed for anxiety. [place]  tablet 1  . amLODipine (NORVASC) 5 MG tablet TAKE 1 TABLET BY MOUTH  DAILY 90 tablet 1  . atenolol (TENORMIN) 50 MG tablet TAKE 1 TABLET BY MOUTH  DAILY 90 tablet 1  . Cholecalciferol (VITAMIN D3) 1000 units CAPS Take 1,000 Units by mouth daily.     . diphenoxylate-atropine (LOMOTIL) 2.5-0.025 MG tablet TAKE 2 TABLETS NOW THEN 1 TABLET EVERY 4 HOURS AS NEEDED 30 tablet 3  . glipiZIDE (GLUCOTROL) 5 MG tablet Take 2.5 mg by mouth daily with supper.    . irbesartan (AVAPRO) 150 MG tablet TAKE 1 TABLET BY MOUTH  DAILY 90 tablet 1  . levothyroxine (SYNTHROID, LEVOTHROID) 112 MCG tablet TAKE 1 TABLET BY MOUTH  DAILY 90 tablet 3  . levothyroxine (SYNTHROID, LEVOTHROID) 25 MCG tablet TAKE 1 TABLET BY MOUTH  DAILY BEFORE BREAKFAST (Patient taking differently: Take 38mcg on  Mondays,Wednesday and Fridays with the 112mg  levothyroxine) 90 tablet 2  . Multiple Vitamin (MULTIVITAMIN WITH MINERALS) TABS tablet Take 1 tablet by mouth daily.    . pantoprazole (PROTONIX) 40 MG tablet Take 1 tablet (40 mg total) by mouth daily before breakfast. (Patient taking differently: Take 40 mg by mouth as needed (acid reflux). ) 90 tablet 3  . Pitavastatin Calcium (LIVALO) 4 MG TABS Take 1 tablet (4 mg total) by mouth every other day. (Patient taking differently: Take 5 mg by mouth every other day. ) 45 tablet 1  . Pitavastatin Calcium 4 MG TABS Take 5 mg by mouth daily.    . [DISCONTINUED] calcium citrate-vitamin D 200-200 MG-UNIT TABS Take 1 tablet by mouth daily.       No current facility-administered medications on file prior to visit.         Objective:   Physical Exam Blood pressure 140/78, pulse 60, temperature 97.7 F (36.5 C), height 5\' 3"  (1.6 m), weight 165 lb 12.8 oz (75.2 kg). Alert and oriented. Skin warm and dry. Oral mucosa is moist.   . Sclera anicteric, conjunctivae is pink. Thyroid not enlarged. No cervical lymphadenopathy. Lungs clear. Heart regular rate and rhythm.  Abdomen is soft. Bowel sounds are positive. No hepatomegaly. No abdominal masses felt. No tenderness.  No edema to lower extremities.          Assessment & Plan:  Diarrhea. Get Fiber 4 gm OTC. Let me know how u are doing.

## 2017-12-31 ENCOUNTER — Other Ambulatory Visit: Payer: Self-pay | Admitting: Family Medicine

## 2018-01-12 ENCOUNTER — Encounter: Payer: Self-pay | Admitting: Family Medicine

## 2018-01-13 ENCOUNTER — Other Ambulatory Visit: Payer: Self-pay | Admitting: *Deleted

## 2018-01-13 MED ORDER — LEVOTHYROXINE SODIUM 25 MCG PO TABS: ORAL_TABLET | ORAL | 3 refills | Status: DC

## 2018-01-13 MED ORDER — LEVOTHYROXINE SODIUM 112 MCG PO TABS: 112.0000 ug | ORAL_TABLET | Freq: Every day | ORAL | 3 refills | Status: DC

## 2018-02-10 ENCOUNTER — Other Ambulatory Visit: Payer: Self-pay

## 2018-02-10 DIAGNOSIS — L570 Actinic keratosis: Secondary | ICD-10-CM | POA: Diagnosis not present

## 2018-02-10 DIAGNOSIS — D485 Neoplasm of uncertain behavior of skin: Secondary | ICD-10-CM | POA: Diagnosis not present

## 2018-02-10 DIAGNOSIS — D229 Melanocytic nevi, unspecified: Secondary | ICD-10-CM | POA: Diagnosis not present

## 2018-02-10 DIAGNOSIS — L821 Other seborrheic keratosis: Secondary | ICD-10-CM | POA: Diagnosis not present

## 2018-02-14 ENCOUNTER — Other Ambulatory Visit: Payer: Self-pay | Admitting: *Deleted

## 2018-02-14 ENCOUNTER — Encounter: Payer: Self-pay | Admitting: Family Medicine

## 2018-02-14 DIAGNOSIS — E559 Vitamin D deficiency, unspecified: Secondary | ICD-10-CM

## 2018-02-14 DIAGNOSIS — E781 Pure hyperglyceridemia: Secondary | ICD-10-CM

## 2018-02-14 DIAGNOSIS — E119 Type 2 diabetes mellitus without complications: Secondary | ICD-10-CM

## 2018-02-14 DIAGNOSIS — E039 Hypothyroidism, unspecified: Secondary | ICD-10-CM

## 2018-02-14 DIAGNOSIS — I10 Essential (primary) hypertension: Secondary | ICD-10-CM

## 2018-02-15 ENCOUNTER — Other Ambulatory Visit: Payer: Medicare Other

## 2018-02-15 DIAGNOSIS — E781 Pure hyperglyceridemia: Secondary | ICD-10-CM

## 2018-02-15 DIAGNOSIS — E119 Type 2 diabetes mellitus without complications: Secondary | ICD-10-CM | POA: Diagnosis not present

## 2018-02-15 DIAGNOSIS — I10 Essential (primary) hypertension: Secondary | ICD-10-CM

## 2018-02-15 DIAGNOSIS — E039 Hypothyroidism, unspecified: Secondary | ICD-10-CM

## 2018-02-15 DIAGNOSIS — E559 Vitamin D deficiency, unspecified: Secondary | ICD-10-CM

## 2018-02-15 LAB — BAYER DCA HB A1C WAIVED: HB A1C: 6.4 % (ref <7.0)

## 2018-02-16 LAB — BASIC METABOLIC PANEL
BUN / CREAT RATIO: 20 (ref 12–28)
BUN: 26 mg/dL (ref 8–27)
CALCIUM: 9.4 mg/dL (ref 8.7–10.3)
CHLORIDE: 104 mmol/L (ref 96–106)
CO2: 18 mmol/L — AB (ref 20–29)
CREATININE: 1.28 mg/dL — AB (ref 0.57–1.00)
GFR calc Af Amer: 45 mL/min/{1.73_m2} — ABNORMAL LOW (ref >59)
GFR calc non Af Amer: 39 mL/min/{1.73_m2} — ABNORMAL LOW (ref >59)
GLUCOSE: 138 mg/dL — AB (ref 65–99)
Potassium: 5.3 mmol/L — ABNORMAL HIGH (ref 3.5–5.2)
Sodium: 143 mmol/L (ref 134–144)

## 2018-02-16 LAB — HEPATIC FUNCTION PANEL
ALBUMIN: 4.7 g/dL (ref 3.5–4.7)
ALK PHOS: 128 IU/L — AB (ref 39–117)
ALT: 19 IU/L (ref 0–32)
AST: 19 IU/L (ref 0–40)
BILIRUBIN, DIRECT: 0.07 mg/dL (ref 0.00–0.40)
TOTAL PROTEIN: 7.3 g/dL (ref 6.0–8.5)

## 2018-02-16 LAB — CBC WITH DIFFERENTIAL/PLATELET
BASOS ABS: 0.1 10*3/uL (ref 0.0–0.2)
Basos: 1 %
EOS (ABSOLUTE): 0 10*3/uL (ref 0.0–0.4)
Eos: 0 %
HEMOGLOBIN: 14 g/dL (ref 11.1–15.9)
Hematocrit: 41.6 % (ref 34.0–46.6)
IMMATURE GRANS (ABS): 0.1 10*3/uL (ref 0.0–0.1)
IMMATURE GRANULOCYTES: 1 %
LYMPHS: 36 %
Lymphocytes Absolute: 2.6 10*3/uL (ref 0.7–3.1)
MCH: 30.2 pg (ref 26.6–33.0)
MCHC: 33.7 g/dL (ref 31.5–35.7)
MCV: 90 fL (ref 79–97)
MONOCYTES: 10 %
Monocytes Absolute: 0.7 10*3/uL (ref 0.1–0.9)
NEUTROS ABS: 3.8 10*3/uL (ref 1.4–7.0)
NEUTROS PCT: 52 %
Platelets: 240 10*3/uL (ref 150–450)
RBC: 4.63 x10E6/uL (ref 3.77–5.28)
RDW: 12.9 % (ref 12.3–15.4)
WBC: 7.4 10*3/uL (ref 3.4–10.8)

## 2018-02-16 LAB — THYROID PANEL WITH TSH
Free Thyroxine Index: 3.5 (ref 1.2–4.9)
T3 Uptake Ratio: 31 % (ref 24–39)
T4 TOTAL: 11.3 ug/dL (ref 4.5–12.0)
TSH: 1.34 u[IU]/mL (ref 0.450–4.500)

## 2018-02-16 LAB — LIPID PANEL
CHOL/HDL RATIO: 5.2 ratio — AB (ref 0.0–4.4)
Cholesterol, Total: 229 mg/dL — ABNORMAL HIGH (ref 100–199)
HDL: 44 mg/dL (ref >39)
LDL Calculated: 119 mg/dL — ABNORMAL HIGH (ref 0–99)
Triglycerides: 328 mg/dL — ABNORMAL HIGH (ref 0–149)
VLDL CHOLESTEROL CAL: 66 mg/dL — AB (ref 5–40)

## 2018-02-16 LAB — VITAMIN D 25 HYDROXY (VIT D DEFICIENCY, FRACTURES): Vit D, 25-Hydroxy: 33.4 ng/mL (ref 30.0–100.0)

## 2018-02-20 ENCOUNTER — Ambulatory Visit: Payer: Medicare Other | Admitting: Internal Medicine

## 2018-02-20 ENCOUNTER — Encounter: Payer: Self-pay | Admitting: Internal Medicine

## 2018-02-20 VITALS — BP 120/78 | HR 61 | Ht 63.0 in | Wt 170.0 lb

## 2018-02-20 DIAGNOSIS — E1121 Type 2 diabetes mellitus with diabetic nephropathy: Secondary | ICD-10-CM | POA: Diagnosis not present

## 2018-02-20 DIAGNOSIS — G63 Polyneuropathy in diseases classified elsewhere: Secondary | ICD-10-CM | POA: Diagnosis not present

## 2018-02-20 DIAGNOSIS — E034 Atrophy of thyroid (acquired): Secondary | ICD-10-CM

## 2018-02-20 NOTE — Patient Instructions (Signed)
Please continue: - Glipizide 2.5 mg only before a larger dinner or if you have dessert  Please return in 4 months with your sugar log.

## 2018-02-20 NOTE — Progress Notes (Signed)
Patient ID: Lisa Sutton, female   DOB: [birth year], 84 y.o.   MRN: [id number]   HPI: Lisa Sutton is a 84 y.o.-year-old female whom I previously saw for hypercalcemia in 2016, now returning for DM2 (new condition for me), dx in 2015, non-insulin-dependent, uncontrolled, with complications (CKD, peripheral neuropathy).  Last visit 6 months ago.  She continues to go to [employer] at [employer] 3 times a week.  Last hemoglobin A1c was: 02/15/2018: HbA1c 6.4% 10/07/2017: HbA1c 6.2% 06/10/2017: HbA1c 7.0% 01/2017: HbA1c 6.1%  Lab Results  Component Value Date   HGBA1C 6.2 (H) 09/03/2015   HGBA1C 5.9 05/27/2015   HGBA1C 6.1 03/13/2015   Pt was on a regimen of: - Glyburide 2.5 mg before b'fast - started this after she stopped Metformin in 01/2017 - but she developed symptomatic hypoglycemic episodes and she had to stop Could not tolerate regular metformin due to diarrhea. She could not afford Januvia.  Currently on: - Metformin ER 500 mg twice a day >> stopped 2/2 diarrhea 4-5 weeks ago - Glipizide 2.5 mg only before a larger dinner or if you have dessert - Cinnamon 2 tablets in am  Pt checks her sugars 2-4 times a day: - forgot log - am: 140-168 >> 107-132, 150, 161 >> 110-120, 134 - 2h after b'fast: 141-252 >> 122-145 >> n/c - before lunch:  111-143, 183 >> 100-136 >> n/c >> 84 - 2h after lunch:  90-145 >> 109, 170, 185 >> n/c >> 140s, occas. 170-180 - before dinner: 109 >> 90-127 >> n/c - 2h after dinner: 105-145 >> 73, 89, 120-169 >> n/c - bedtime:  129, 208, 222 >> 89-126 >> 140-150 - nighttime: n/c >> 129, 150 Lowest sugar was 61 >> 90 >> 100 >> 73; she has hypoglycemia awareness in the 70s.  Highest sugar was 222 >> 180 >> 200x1.   Glucometer: Accu-Chek [employer]iva  Pt's meals are: - Breakfast: 2 boiled eggs + toast + tomato or croissants + am and egg - Lunch: [place] sandwich + lettuce + tomato + mayo - Dinner: green beans, slaw + potatoes + meatloaf   - Snacks:  crackers + PB Drinks diet green tea.  -+ CKD-sees nephrology, last BUN/creatinine:  Lab Results  Component Value Date   BUN 26 02/15/2018   BUN 23 10/07/2017   CREATININE 1.28 (H) 02/15/2018   CREATININE 1.27 (H) 10/07/2017  On irbesartan.  - + HL; last set of lipids: Lab Results  Component Value Date   CHOL 229 (H) 02/15/2018   HDL 44 02/15/2018   LDLCALC 119 (H) 02/15/2018   LDLDIRECT 99 11/01/2016   TRIG 328 (H) 02/15/2018   CHOLHDL 5.2 (H) 02/15/2018  On Livalo. Recently PCP rec'd Vascepa. - last eye exam was in 10/2017: No DR -+ Numbness and tingling in her feet.  Previously on Neurontin, which she stopped due to weight gain; now on B complex and alpha lipoic acid. She is not on ASA 81 anymore..  Hypothyroidism: - On LT4 112 mcg daily + an extra 25 mcg MWF   Latest TSH normal earlier this month: Lab Results  Component Value Date   TSH 1.340 02/15/2018   She has a history of secondary (non-PTH mediated) hypercalcemia, due to HCTZ, which normalized after stopping the medication: Lab Results  Component Value Date   PTH 11 (L) 06/04/2015   PTH Comment 06/04/2015   PTH 11 (L) 05/29/2015   PTH Comment 05/29/2015   PTH 6 (L) 01/13/2015   PTH  Comment 01/13/2015   PTH 12 (L) 12/02/2014   PTH 5 (L) 11/26/2014   CALCIUM 9.4 02/15/2018   CALCIUM 9.8 10/07/2017   CALCIUM 9.6 06/10/2017   CALCIUM 9.4 01/27/2017   CALCIUM 10.7 (H) 01/21/2017   CALCIUM 10.2 11/01/2016   CALCIUM 9.5 09/03/2016   CALCIUM 9.7 04/21/2016   CALCIUM 9.3 09/18/2015   CALCIUM 8.9 09/17/2015   CALCIUM 8.6 (L) 09/16/2015   CALCIUM 9.6 09/08/2015   CALCIUM 10.5 (H) 09/03/2015   CALCIUM 9.3 06/04/2015   CALCIUM CANCELED 05/29/2015   CALCIUM CANCELED 05/27/2015   CALCIUM 11.0 (H) 04/28/2015   CALCIUM 11.8 (H) 04/26/2015   CALCIUM 10.2 03/13/2015   CALCIUM 10.5 (H) 02/17/2015   Vitamin D levels have been normal recently: Lab Results  Component Value Date   VD25OH 33.4 02/15/2018   VD25OH  32.6 10/07/2017   VD25OH 38.7 06/10/2017   VD25OH 40.5 01/21/2017   VD25OH 32.9 09/03/2016   VD25OH 63.1 05/27/2015   VD25OH 37.8 03/13/2015   VD25OH 41.1 02/17/2015   VD25OH 53.3 10/17/2014   VD25OH 24.9 (L) 06/19/2014   She is on 1000 units vitamin D daily.  Stopped Premarin 5 years ago. Had hot flushes.   ROS: Constitutional: no weight gain/no weight loss, no fatigue, no subjective hyperthermia, no subjective hypothermia Eyes: no blurry vision, no xerophthalmia ENT: no sore throat, no nodules palpated in throat, no dysphagia, no odynophagia, no hoarseness Cardiovascular: no CP/no SOB/no palpitations/no leg swelling Respiratory: no cough/no SOB/no wheezing Gastrointestinal: no N/no V/+ D/no C/no acid reflux Musculoskeletal: + muscle aches/no joint aches Skin: no rashes, no hair loss Neurological: no tremors/+ numbness/+ tingling/no dizziness  I reviewed pt's medications, allergies, PMH, social hx, family hx, and changes were documented in the history of present illness. Otherwise, unchanged from my initial visit note.  Past Medical History:  Diagnosis Date  . Anxiety   . Arthritis   . Cataract   . Dysphagia    'sometimes but not a major issue' been checked out by GI (per pt)  . Dysrhythmia    'heart used to skip but doesn't anymore' was checked out by Dr. Ron Sutton late '90s, everything checked out ok and not had any skipping since (all per pt)  . Essential hypertension, benign   . Fatty liver   . GERD (gastroesophageal reflux disease)   . Hepatitis B    had at age 5, 'GI doc said it's gone away'  . Hepatitis B surface antigen positive   . History of hiatal hernia   . Hypercalcemia   . Hypothyroidism   . Kidney stone August 2014   Patient [facility]  . Obesity   . Osteoporosis   . PONV (postoperative nausea and vomiting)   . Type 2 diabetes mellitus ([place])    Past Surgical History:  Procedure Laterality Date  . ABDOMINAL HYSTERECTOMY    . BILATERAL CARPAL  TUNNEL RELEASE     Dr. Marlou Sutton at surgical center  . BIOPSY  05/26/2017   Procedure: BIOPSY;  Surgeon: Lisa Houston, MD;  [facility]: [facility];  Service: Endoscopy;;  colon  . CATARACT EXTRACTION, BILATERAL    . COLONOSCOPY N/A 05/26/2017   Procedure: COLONOSCOPY;  Surgeon: Lisa Houston, MD;  Location: [facility];  Service: Endoscopy;  Laterality: N/A;  10:30  . Deviated septum repair    . ESOPHAGOGASTRODUODENOSCOPY N/A 07/28/2016   Procedure: ESOPHAGOGASTRODUODENOSCOPY (EGD);  Surgeon: Lisa Houston, MD;  Location: [facility];  Service: Endoscopy;  Laterality: N/A;  300 - moved to 3/7 @ 1:00  . EYE SURGERY    . KNEE ARTHROPLASTY Left 09/15/2015   Procedure: COMPUTER ASSISTED TOTAL KNEE ARTHROPLASTY;  Surgeon: Marybelle Killings, MD;  Location: [facility];  Service: Orthopedics;  Laterality: Left;  . KNEE ARTHROSCOPY     left knee 2003   Social History   Socioeconomic History  . Marital status: Widowed    Spouse name: Not on file  . Number of children: Not on file  . Years of education: Not on file  . Highest education level: Not on file  Occupational History  . Not on file  Social Needs  . Financial resource strain: Not on file  . Food insecurity:    Worry: Not on file    Inability: Not on file  . Transportation needs:    Medical: Not on file    Non-medical: Not on file  Tobacco Use  . Smoking status: Never Smoker  . Smokeless tobacco: Never Used  Substance and Sexual Activity  . Alcohol use: No    Alcohol/week: 0.0 standard drinks  . Drug use: No  . Sexual activity: Never  Lifestyle  . Physical activity:    Days per week: Not on file    Minutes per session: Not on file  . Stress: Not on file  Relationships  . Social connections:    Talks on phone: Not on file    Gets together: Not on file    Attends religious service: Not on file    Active member of club or organization: Not on file    Attends meetings of clubs or organizations: Not on file    Relationship  status: Not on file  . Intimate partner violence:    Fear of current or ex partner: Not on file    Emotionally abused: Not on file    Physically abused: Not on file    Forced sexual activity: Not on file  Other Topics Concern  . Not on file  Social History Narrative  . Not on file   Current Outpatient Medications on File Prior to Visit  Medication Sig Dispense Refill  . acetaminophen (TYLENOL) 500 MG tablet Take 1,000 mg by mouth daily as needed for moderate pain or headache.    . ALPRAZolam (XANAX) 0.25 MG tablet Take 1 tablet (0.25 mg total) by mouth 2 (two) times daily as needed for anxiety. 180 tablet 1  . amLODipine (NORVASC) 5 MG tablet TAKE 1 TABLET BY MOUTH  DAILY 90 tablet 1  . atenolol (TENORMIN) 50 MG tablet TAKE 1 TABLET BY MOUTH  DAILY 90 tablet 1  . Cholecalciferol (VITAMIN D3) 1000 units CAPS Take 1,000 Units by mouth daily.     . diphenoxylate-atropine (LOMOTIL) 2.5-0.025 MG tablet TAKE 2 TABLETS NOW THEN 1 TABLET EVERY 4 HOURS AS NEEDED 30 tablet 3  . glipiZIDE (GLUCOTROL) 5 MG tablet Take 2.5 mg by mouth daily with supper.    . irbesartan (AVAPRO) 150 MG tablet TAKE 1 TABLET BY MOUTH  DAILY 90 tablet 1  . levothyroxine (SYNTHROID, LEVOTHROID) 112 MCG tablet Take 1 tablet (112 mcg total) by mouth daily. 90 tablet 3  . levothyroxine (SYNTHROID, LEVOTHROID) 25 MCG tablet TAKE 1 TABLET BY MOUTH  DAILY BEFORE BREAKFAST on MON, WED and FRI only 36 tablet 3  . Multiple Vitamin (MULTIVITAMIN WITH MINERALS) TABS tablet Take 1 tablet by mouth daily.    . pantoprazole (PROTONIX) 40 MG tablet Take 1 tablet (40 mg total)  by mouth daily before breakfast. (Patient taking differently: Take 40 mg by mouth as needed (acid reflux). ) 90 tablet 3  . Pitavastatin Calcium (LIVALO) 4 MG TABS Take 1 tablet (4 mg total) by mouth every other day. (Patient taking differently: Take 5 mg by mouth every other day. ) 45 tablet 1  . Pitavastatin Calcium 4 MG TABS Take 5 mg by mouth daily.    .  [DISCONTINUED] calcium citrate-vitamin D 200-200 MG-UNIT TABS Take 1 tablet by mouth daily.       No current facility-administered medications on file prior to visit.    Allergies  Allergen Reactions  . Statins Other (See Comments)    'discomfort, aching everywhere' Tolerates to livalo   . Zetia [Ezetimibe] Other (See Comments)    Leg pain  . Zocor [Simvastatin] Other (See Comments)    Leg pain   Family History  Problem Relation Age of Onset  . Hypertension Father   . Hip fracture Father   . COPD Mother   . Cancer Maternal Aunt   . Diabetes Maternal Aunt   . Diabetes Maternal Uncle   . Colon cancer Neg Hx     PE: BP 120/78   Pulse 61   Ht 5\' 3"  (1.6 m)   Wt 170 lb (77.1 kg)   SpO2 97%   BMI 30.11 kg/m  Wt Readings from Last 3 Encounters:  02/20/18 170 lb (77.1 kg)  12/15/17 165 lb 12.8 oz (75.2 kg)  10/19/17 162 lb (73.5 kg)   Constitutional: overweight, in NAD Eyes: PERRLA, EOMI, no exophthalmos ENT: moist mucous membranes, no thyromegaly, no cervical lymphadenopathy Cardiovascular: RRR, No MRG Respiratory: CTA B Gastrointestinal: abdomen soft, NT, ND, BS+ Musculoskeletal: no deformities, strength intact in all 4 Skin: moist, warm, no rashes Neurological: no tremor with outstretched hands, DTR normal in all 4  ASSESSMENT: 1. DM2, non-insulin-dependent, uncontrolled, with complications - CKD - PN  2.  Peripheral neuropathy secondary to diabetes  3. Hypercalcemia  4. Hypothyroidism  PLAN:  1. Patient with history of controlled diabetes, on metformin and glipizide only with larger dinners.  At last visit, we reduced the dose of metformin that she was having some diarrhea with it.  At that time, her sugars were higher after being on Glucerna between meals.  We stopped this and also discussed about changes in her diet to improve her insulin resistance.  She did have improved after dinner after addition of low-dose glipizide, so we continue this only before a  larger meal or if she had dessert. - however, since last visit visit, she had to stop Metformin b/c diarrhea. Sugars did not significantly increase - we can continue off metformin for now - she only takes Glipizide with a larger dinner >> will continue this - we discussed about improving diet so we do not need to intensify her regimen. She agrees to try to add 1 tab Metformin a day if her sugars increase in the future, but not needed for now - reviewed latest HbA1c from few days ago: 02/15/2018: HbA1c 6.4% (great!) - I suggested to:  Patient Instructions  Please continue: - Glipizide 2.5 mg only before a larger dinner or if you have dessert  Please return in 4 months with your sugar log.   - continue checking sugars at different times of the day - check 1x a day, rotating checks - advised for yearly eye exams >> she is UTD - will get flu shot at appt with PCP tomorrow, per her  preference - Return to clinic in 4 mo with sugar log      2.  Peripheral neuropathy secondary to diabetes -Stable -She stopped Neurontin due to concerns for weight gain -She continues with alpha lipoic acid and B complex.  Has Cymbalta - will start.  3. Hypercalcemia -Mild -Latest calcium level reviewed with the patient: Normal -She had suppressed PTH and normal vitamin D levels at last check -This was probably secondary to HCTZ-she is now off the medication.  4. Hypothyroidism -Well-controlled.  Last TSH: Lab Results  Component Value Date   TSH 1.340 02/15/2018    Philemon Kingdom, MD PhD [facility]

## 2018-02-21 ENCOUNTER — Ambulatory Visit (INDEPENDENT_AMBULATORY_CARE_PROVIDER_SITE_OTHER): Payer: Medicare Other | Admitting: Internal Medicine

## 2018-02-22 ENCOUNTER — Encounter: Payer: Self-pay | Admitting: Family Medicine

## 2018-02-22 ENCOUNTER — Ambulatory Visit (INDEPENDENT_AMBULATORY_CARE_PROVIDER_SITE_OTHER): Payer: Medicare Other | Admitting: Family Medicine

## 2018-02-22 VITALS — BP 131/71 | HR 55 | Temp 97.1°F | Ht 63.0 in | Wt 168.0 lb

## 2018-02-22 DIAGNOSIS — E039 Hypothyroidism, unspecified: Secondary | ICD-10-CM

## 2018-02-22 DIAGNOSIS — F5101 Primary insomnia: Secondary | ICD-10-CM

## 2018-02-22 DIAGNOSIS — Z23 Encounter for immunization: Secondary | ICD-10-CM | POA: Diagnosis not present

## 2018-02-22 DIAGNOSIS — E781 Pure hyperglyceridemia: Secondary | ICD-10-CM | POA: Diagnosis not present

## 2018-02-22 DIAGNOSIS — Z789 Other specified health status: Secondary | ICD-10-CM

## 2018-02-22 DIAGNOSIS — I10 Essential (primary) hypertension: Secondary | ICD-10-CM | POA: Diagnosis not present

## 2018-02-22 DIAGNOSIS — E119 Type 2 diabetes mellitus without complications: Secondary | ICD-10-CM | POA: Diagnosis not present

## 2018-02-22 DIAGNOSIS — G609 Hereditary and idiopathic neuropathy, unspecified: Secondary | ICD-10-CM

## 2018-02-22 DIAGNOSIS — E559 Vitamin D deficiency, unspecified: Secondary | ICD-10-CM

## 2018-02-22 DIAGNOSIS — N1832 Chronic kidney disease, stage 3b: Secondary | ICD-10-CM

## 2018-02-22 DIAGNOSIS — N183 Chronic kidney disease, stage 3 (moderate): Secondary | ICD-10-CM

## 2018-02-22 DIAGNOSIS — K219 Gastro-esophageal reflux disease without esophagitis: Secondary | ICD-10-CM

## 2018-02-22 MED ORDER — ICOSAPENT ETHYL 1 G PO CAPS: 2.0000 | ORAL_CAPSULE | Freq: Two times a day (BID) | ORAL | 3 refills | Status: DC

## 2018-02-22 NOTE — Progress Notes (Signed)
Subjective:    Patient ID: Lisa Sutton, female    DOB: [birth year], 84 y.o.   MRN: [id number]  HPI Pt here for follow up and management of chronic medical problems which includes hypothyroid, diabetes and hypertension. She is taking medication regularly.  Bennink complains today of trouble sleeping.  She has had recent lab work done and this will be reviewed with her during the visit today.  All of her cholesterol numbers were elevated.  The total cholesterol was 229 and triglycerides were elevated at 328.  The LDL-C cholesterol was elevated at 119 and should be less than 100 but previous reading was 150.  The HDL remains good.  The hemoglobin A1c was good and stable and she just recently saw her endocrinologist.  It was 6.4%.  CBC had a normal white blood cell count a good and stable hemoglobin at 14 and an adequate platelet count.  The blood sugar was elevated at 138.  The creatinine, the most important kidney function remains elevated but stable compared to 4 months ago at 1.28.  The potassium was slightly elevated by 1/10 of a point and the sodium and chloride were good.  All liver function tests were normal but one, the alkaline phosphatase was elevated at 128.  All thyroid function tests were normal.  The vitamin D level was good at 33.4.  She today denies any chest pain pressure tightness or breath.  She still has occasional loose bowel movements but improved since she has been eating more fiber.  She still has occasional heartburn.  She plans to see the gastroenterologist next week.  She denies any blood in the stool.  She had a colonoscopy early this year and there was no colitis found.  She has problems with urgency with voiding.  Because of the elevated liver function test we will ask her to repeat the liver function test in about 4 weeks.  She will get a flu shot today.     Patient Active Problem List   Diagnosis Date Noted  . Chronic kidney disease (CKD) stage G3b/A1, moderately  decreased glomerular filtration rate (GFR) between 30-44 mL/min/1.73 square meter and albuminuria creatinine ratio less than 30 mg/g (HCC) 06/15/2017  . Diarrhea 05/09/2017  . History of colonic polyps 05/09/2017  . H/O hypercalcemia 04/12/2017  . Upper airway cough syndrome 09/14/2016  . Hemoptysis 07/16/2016  . Acute pain of right knee 06/02/2016  . Hypothyroidism due to acquired atrophy of thyroid 04/26/2016  . Vitamin D deficiency 04/26/2016  . Status post total left knee replacement 09/15/2015  . Statin intolerance 09/11/2015  . Peripheral neuropathy 07/30/2015  . Abnormal chest x-ray 11/27/2014  . Protein-calorie malnutrition, severe ([place]) 11/27/2014  . AKI (acute kidney injury) ([place]) 11/26/2014  . Hypercalcemia 11/26/2014  . Leukocytosis 11/26/2014  . Weight loss 11/26/2014  . Weakness 11/26/2014  . Insomnia 04/12/2013  . Hyperlipidemia 04/12/2013  . Essential hypertension, benign 10/13/2012  . GERD (gastroesophageal reflux disease) 04/25/2012  . Fatty liver 08/09/2011  . Hepatitis B 08/09/2011  . Dysphagia 08/09/2011  . Type 2 diabetes mellitus with diabetic nephropathy, without long-term current use of insulin ([place]) 08/09/2011   Outpatient Encounter Medications as of 02/22/2018  Medication Sig  . acetaminophen (TYLENOL) 500 MG tablet Take 1,000 mg by mouth daily as needed for moderate pain or headache.  . ALPRAZolam (XANAX) 0.25 MG tablet Take 1 tablet (0.25 mg total) by mouth 2 (two) times daily as needed for anxiety.  Marland Kitchen amLODipine (NORVASC) 5 MG tablet  TAKE 1 TABLET BY MOUTH  DAILY  . atenolol (TENORMIN) 50 MG tablet TAKE 1 TABLET BY MOUTH  DAILY  . Cholecalciferol (VITAMIN D3) 1000 units CAPS Take 1,000 Units by mouth daily.   . diphenoxylate-atropine (LOMOTIL) 2.5-0.025 MG tablet TAKE 2 TABLETS NOW THEN 1 TABLET EVERY 4 HOURS AS NEEDED  . DULoxetine (CYMBALTA) 30 MG capsule Take 30 mg by mouth daily.  Marland Kitchen glipiZIDE (GLUCOTROL) 5 MG tablet Take 2.5 mg by mouth daily with  supper.  . irbesartan (AVAPRO) 150 MG tablet TAKE 1 TABLET BY MOUTH  DAILY  . levothyroxine (SYNTHROID, LEVOTHROID) 112 MCG tablet Take 1 tablet (112 mcg total) by mouth daily.  Marland Kitchen levothyroxine (SYNTHROID, LEVOTHROID) 25 MCG tablet TAKE 1 TABLET BY MOUTH  DAILY BEFORE BREAKFAST on MON, WED and FRI only  . Multiple Vitamin (MULTIVITAMIN WITH MINERALS) TABS tablet Take 1 tablet by mouth daily.  . pantoprazole (PROTONIX) 40 MG tablet Take 1 tablet (40 mg total) by mouth daily before breakfast. (Patient taking differently: Take 40 mg by mouth as needed (acid reflux). )  . [DISCONTINUED] calcium citrate-vitamin D 200-200 MG-UNIT TABS Take 1 tablet by mouth daily.    . [DISCONTINUED] Pitavastatin Calcium (LIVALO) 4 MG TABS Take 1 tablet (4 mg total) by mouth every other day. (Patient not taking: Reported on 02/20/2018)  . [DISCONTINUED] Pitavastatin Calcium 4 MG TABS Take 5 mg by mouth daily.   No facility-administered encounter medications on file as of 02/22/2018.      Review of Systems  Constitutional: Negative.        Trouble sleeping   HENT: Negative.   Eyes: Negative.   Respiratory: Negative.   Cardiovascular: Negative.   Gastrointestinal: Negative.   Endocrine: Negative.   Genitourinary: Negative.   Musculoskeletal: Negative.   Skin: Negative.   Allergic/Immunologic: Negative.   Neurological: Negative.   Hematological: Negative.   Psychiatric/Behavioral: Negative.        Objective:   Physical Exam  Constitutional: She is oriented to person, place, and time. She appears well-developed and well-nourished.  The patient is pleasant and alert and active and feeling well.  HENT:  Head: Normocephalic and atraumatic.  Right Ear: External ear normal.  Left Ear: External ear normal.  Nose: Nose normal.  Mouth/Throat: Oropharynx is clear and moist. No oropharyngeal exudate.  HEENT all within normal limits with no nasal congestion.  Eyes: Pupils are equal, round, and reactive to light.  Conjunctivae and EOM are normal. Right eye exhibits no discharge. Left eye exhibits no discharge. No scleral icterus.  Neck: Normal range of motion. Neck supple. No JVD present. No thyromegaly present.  No bruits thyromegaly or anterior cervical adenopathy  Cardiovascular: Normal rate, regular rhythm, normal heart sounds and intact distal pulses.  No murmur heard. Heart is regular at 60/min with good pedal pulses  Pulmonary/Chest: Effort normal and breath sounds normal. She has no wheezes. She has no rales.  Clear anteriorly and posteriorly  Abdominal: Soft. Bowel sounds are normal. She exhibits no mass. There is no tenderness.  No liver or spleen enlargement.  No epigastric tenderness.  No masses no bruits.  Musculoskeletal: Normal range of motion. She exhibits no edema.  History left knee replacement  Lymphadenopathy:    She has no cervical adenopathy.  Neurological: She is alert and oriented to person, place, and time. She has normal reflexes. No cranial nerve deficit.  Reflexes are good and equal bilaterally.  Skin: Skin is warm and dry. No rash noted.  Psychiatric: She has  a normal mood and affect. Her behavior is normal. Judgment and thought content normal.  Mood affect and behavior are all normal.  Nursing note and vitals reviewed.  BP 131/71 (BP Location: Left Arm)   Pulse (!) 55   Temp (!) 97.1 F (36.2 C) (Oral)   Ht 5\' 3"  (1.6 m)   Wt 168 lb (76.2 kg)   BMI 29.76 kg/m         Assessment & Plan:  1. Hypothyroidism, unspecified type -Continue current treatment as thyroid tests are all good.  2. Type 2 diabetes mellitus without complication, without long-term current use of insulin [facility]) -Patient sees endocrinologist and recent hemoglobin A1c was good at 6.4%.  3. Essential hypertension, benign -Blood pressure is good at current treatment  4. Hypertriglyceridemia -Triglyceride numbers remain elevated and have been that way for a good while.  We will encourage the  patient to take Vascepa at 2 g twice daily and hopefully we can get insurance approval for this.  She should continue with aggressive therapeutic lifestyle changes.  5. Vitamin D deficiency -Continue with current vitamin D replacement  6. Gastroesophageal reflux disease without esophagitis -Continues to have occasional heartburn and will discuss this with her gastroenterologist when she sees them next week  7. Primary insomnia -Take melatonin as directed  8. Idiopathic peripheral neuropathy -Continue with Cymbalta 30 mg daily  9. Chronic kidney disease (CKD) stage G3b/A1, moderately decreased glomerular filtration rate (GFR) between 30-44 mL/min/1.73 square meter and albuminuria creatinine ratio less than 30 mg/g (HCC) -Continue to avoid all NSAIDs and keep blood pressure under the best control possible.  10. Statin intolerance -Patient has recently stopped Livalo because of muscle aches and myalgias.  11. Hypercalcemia -This is good currently.  Patient Instructions                       Medicare Annual Wellness Visit  [place] and the medical providers at [facility] strive to bring you the best medical care.  In doing so we not only want to address your current medical conditions and concerns but also to detect new conditions early and prevent illness, disease and health-related problems.    Medicare offers a yearly Wellness Visit which allows our clinical staff to assess your need for preventative services including immunizations, lifestyle education, counseling to decrease risk of preventable diseases and screening for fall risk and other medical concerns.    This visit is provided free of charge (no copay) for all Medicare recipients. The clinical pharmacists at [facility] have begun to conduct these Wellness Visits which will also include a thorough review of all your medications.    As you primary medical provider recommend that  you make an appointment for your Annual Wellness Visit if you have not done so already this year.  You may set up this appointment before you leave today or you may call back ([phone]) and schedule an appointment.  Please make sure when you call that you mention that you are scheduling your Annual Wellness Visit with the clinical pharmacist so that the appointment may be made for the proper length of time.     Continue current medications. Continue good therapeutic lifestyle changes which include good diet and exercise. Fall precautions discussed with patient. If an FOBT was given today- please return it to our front desk. If you are over 6 years old - you may need Prevnar 68 or the adult Pneumonia vaccine.  **  Flu shots are available--- please call and schedule a FLU-CLINIC appointment**  After your visit with [place] today you will receive a survey in the mail or online from [employer] regarding your care with [place]. Please take a moment to fill this out. Your feedback is very important to [place] as you can help [place] better understand your patient needs as well as improve your experience and satisfaction. WE CARE ABOUT YOU!!!   Repeat liver function test in 4 weeks Drink plenty of water Continue to stay active physically Follow-up with gastroenterology as planned Follow-up with endocrinology as planned Try melatonin 3 mg and repeat this if you wake up in the middle of the night.  Arrie Senate MD

## 2018-02-22 NOTE — Patient Instructions (Addendum)
Medicare Annual Wellness Visit  [place] and the medical providers at [facility] strive to bring you the best medical care.  In doing so we not only want to address your current medical conditions and concerns but also to detect new conditions early and prevent illness, disease and health-related problems.    Medicare offers a yearly Wellness Visit which allows our clinical staff to assess your need for preventative services including immunizations, lifestyle education, counseling to decrease risk of preventable diseases and screening for fall risk and other medical concerns.    This visit is provided free of charge (no copay) for all Medicare recipients. The clinical pharmacists at [facility] have begun to conduct these Wellness Visits which will also include a thorough review of all your medications.    As you primary medical provider recommend that you make an appointment for your Annual Wellness Visit if you have not done so already this year.  You may set up this appointment before you leave today or you may call back ([phone]) and schedule an appointment.  Please make sure when you call that you mention that you are scheduling your Annual Wellness Visit with the clinical pharmacist so that the appointment may be made for the proper length of time.     Continue current medications. Continue good therapeutic lifestyle changes which include good diet and exercise. Fall precautions discussed with patient. If an FOBT was given today- please return it to our front desk. If you are over 42 years old - you may need Prevnar 35 or the adult Pneumonia vaccine.  **Flu shots are available--- please call and schedule a FLU-CLINIC appointment**  After your visit with [place] today you will receive a survey in the mail or online from [employer] regarding your care with [place]. Please take a moment to fill this out. Your feedback is very  important to [place] as you can help [place] better understand your patient needs as well as improve your experience and satisfaction. WE CARE ABOUT YOU!!!   Repeat liver function test in 4 weeks Drink plenty of water Continue to stay active physically Follow-up with gastroenterology as planned Follow-up with endocrinology as planned Try melatonin 3 mg and repeat this if you wake up in the middle of the night.

## 2018-02-22 NOTE — Addendum Note (Signed)
Addended by: Zannie Cove on: 02/22/2018 11:42 AM   Modules accepted: Orders

## 2018-02-22 NOTE — Addendum Note (Signed)
Addended by: Zannie Cove on: 02/22/2018 11:41 AM   Modules accepted: Orders

## 2018-02-27 ENCOUNTER — Encounter (INDEPENDENT_AMBULATORY_CARE_PROVIDER_SITE_OTHER): Payer: Self-pay | Admitting: Internal Medicine

## 2018-02-27 ENCOUNTER — Ambulatory Visit (INDEPENDENT_AMBULATORY_CARE_PROVIDER_SITE_OTHER): Payer: Medicare Other | Admitting: Internal Medicine

## 2018-02-27 VITALS — BP 110/64 | HR 62 | Temp 97.8°F | Resp 18 | Ht 63.0 in | Wt 164.7 lb

## 2018-02-27 DIAGNOSIS — K219 Gastro-esophageal reflux disease without esophagitis: Secondary | ICD-10-CM | POA: Diagnosis not present

## 2018-02-27 DIAGNOSIS — R197 Diarrhea, unspecified: Secondary | ICD-10-CM

## 2018-02-27 NOTE — Progress Notes (Signed)
Presenting complaint;  Follow-up for diarrhea and GERD.  Subjective:  Patient is 84 year old Caucasian female who has chronic GERD and chronic diarrhea who is here for scheduled visit.  She states she is doing well as source her heartburn is concerned.  She may have an episode once or twice a week.  When she has diarrhea she may have to 3 loose stools per day.  She only has had one episode of nocturnal diarrhea.  She did come off metformin and diarrhea decreased but not fully gone.  She feels Metamucil and probiotic is helping.  She was recently begun on 2 new medications by Dr. Laurance Flatten last week and she is experiencing side effects. She will contact his office if side effects worsen. She states she is taking pantoprazole on as-needed basis.  She has heartburn no more than once or twice a week and is never bad.  She denies dysphagia.  Current Medications: Outpatient Encounter Medications as of 02/27/2018  Medication Sig  . acetaminophen (TYLENOL) 500 MG tablet Take 1,000 mg by mouth daily as needed for moderate pain or headache.  . ALPRAZolam (XANAX) 0.25 MG tablet Take 1 tablet (0.25 mg total) by mouth 2 (two) times daily as needed for anxiety.  Marland Kitchen amLODipine (NORVASC) 5 MG tablet TAKE 1 TABLET BY MOUTH  DAILY  . atenolol (TENORMIN) 50 MG tablet TAKE 1 TABLET BY MOUTH  DAILY  . Cholecalciferol (VITAMIN D3) 1000 units CAPS Take 1,000 Units by mouth daily.   . diphenoxylate-atropine (LOMOTIL) 2.5-0.025 MG tablet TAKE 2 TABLETS NOW THEN 1 TABLET EVERY 4 HOURS AS NEEDED  . DULoxetine (CYMBALTA) 30 MG capsule Take 30 mg by mouth daily.  Marland Kitchen glipiZIDE (GLUCOTROL) 5 MG tablet Take 2.5 mg by mouth daily with supper.  Vanessa Kick Ethyl (VASCEPA) 1 g CAPS Take 2 capsules (2 g total) by mouth 2 (two) times daily.  . irbesartan (AVAPRO) 150 MG tablet TAKE 1 TABLET BY MOUTH  DAILY  . levothyroxine (SYNTHROID, LEVOTHROID) 112 MCG tablet Take 1 tablet (112 mcg total) by mouth daily.  Marland Kitchen levothyroxine (SYNTHROID,  LEVOTHROID) 25 MCG tablet TAKE 1 TABLET BY MOUTH  DAILY BEFORE BREAKFAST on MON, WED and FRI only  . Multiple Vitamin (MULTIVITAMIN WITH MINERALS) TABS tablet Take 1 tablet by mouth daily.  . pantoprazole (PROTONIX) 40 MG tablet Take 1 tablet (40 mg total) by mouth daily before breakfast. (Patient taking differently: Take 40 mg by mouth as needed (acid reflux). )  . [DISCONTINUED] calcium citrate-vitamin D 200-200 MG-UNIT TABS Take 1 tablet by mouth daily.     No facility-administered encounter medications on file as of 02/27/2018.      Objective: Blood pressure 110/64, pulse 62, temperature 97.8 F (36.6 C), temperature source Oral, resp. rate 18, height 5\' 3"  (1.6 m), weight 164 lb 11.2 oz (74.7 kg). Patient is alert and in no acute distress. Conjunctiva is pink. Sclera is nonicteric Oropharyngeal mucosa is normal. No neck masses or thyromegaly noted. Cardiac exam with regular rhythm normal S1 and S2. No murmur or gallop noted. Lungs are clear to auscultation. Abdomen is full but soft and nontender with organomegaly or masses. No LE edema or clubbing noted.  Labs/studies Results:  Bilirubin less than 0.2 AP 128, AST 19, ALT 19 and albumin 4.7.   #1.  Chronic diarrhea.  Suspect she has irritable bowel syndrome or diabetic diarrhea.  She had colonoscopy in January this year with colonic biopsy and was negative for collagenous or microscopic colitis.  Diarrhea improved off  metformin but not fully.  She is not a candidate for anti-spasmodic therapy.  Will treat her symptomatically with low-dose Imodium.  #2.  GERD.  She is doing well with antireflux measures and PPI on demand.  #3.  History of inactive hepatitis B surface carrier.  Transaminases remain normal.  Viral HBV DNA in the past has been undetectable.  No indication for further intervention.   Plan:  DC diphenoxylate. Continue Imodium 2 mg daily or every other day. Patient will call with progress report in 1 month. Office  visit in 1 year.

## 2018-02-27 NOTE — Patient Instructions (Signed)
Patient to call with progress report in 4 weeks.

## 2018-03-13 ENCOUNTER — Other Ambulatory Visit: Payer: Medicare Other

## 2018-03-13 DIAGNOSIS — Z1211 Encounter for screening for malignant neoplasm of colon: Secondary | ICD-10-CM

## 2018-03-15 LAB — FECAL OCCULT BLOOD, IMMUNOCHEMICAL: Fecal Occult Bld: NEGATIVE

## 2018-03-16 ENCOUNTER — Encounter (INDEPENDENT_AMBULATORY_CARE_PROVIDER_SITE_OTHER): Payer: Self-pay

## 2018-03-28 ENCOUNTER — Other Ambulatory Visit: Payer: Medicare Other

## 2018-03-28 DIAGNOSIS — E781 Pure hyperglyceridemia: Secondary | ICD-10-CM | POA: Diagnosis not present

## 2018-03-29 ENCOUNTER — Other Ambulatory Visit: Payer: Self-pay | Admitting: Family Medicine

## 2018-03-29 LAB — HEPATIC FUNCTION PANEL
ALBUMIN: 4.1 g/dL (ref 3.5–4.7)
ALT: 14 IU/L (ref 0–32)
AST: 17 IU/L (ref 0–40)
Alkaline Phosphatase: 95 IU/L (ref 39–117)
Bilirubin Total: 0.2 mg/dL (ref 0.0–1.2)
Bilirubin, Direct: 0.08 mg/dL (ref 0.00–0.40)
TOTAL PROTEIN: 6.7 g/dL (ref 6.0–8.5)

## 2018-04-07 ENCOUNTER — Other Ambulatory Visit: Payer: Self-pay | Admitting: Family Medicine

## 2018-04-14 DIAGNOSIS — L821 Other seborrheic keratosis: Secondary | ICD-10-CM | POA: Diagnosis not present

## 2018-04-14 DIAGNOSIS — L570 Actinic keratosis: Secondary | ICD-10-CM | POA: Diagnosis not present

## 2018-06-15 ENCOUNTER — Encounter: Payer: Self-pay | Admitting: Family Medicine

## 2018-06-15 ENCOUNTER — Other Ambulatory Visit: Payer: Self-pay | Admitting: *Deleted

## 2018-06-15 DIAGNOSIS — E119 Type 2 diabetes mellitus without complications: Secondary | ICD-10-CM

## 2018-06-15 DIAGNOSIS — K219 Gastro-esophageal reflux disease without esophagitis: Secondary | ICD-10-CM

## 2018-06-15 DIAGNOSIS — E039 Hypothyroidism, unspecified: Secondary | ICD-10-CM

## 2018-06-15 DIAGNOSIS — I10 Essential (primary) hypertension: Secondary | ICD-10-CM

## 2018-06-15 DIAGNOSIS — E559 Vitamin D deficiency, unspecified: Secondary | ICD-10-CM

## 2018-06-15 DIAGNOSIS — E781 Pure hyperglyceridemia: Secondary | ICD-10-CM

## 2018-06-19 ENCOUNTER — Other Ambulatory Visit: Payer: Medicare Other

## 2018-06-19 DIAGNOSIS — E039 Hypothyroidism, unspecified: Secondary | ICD-10-CM

## 2018-06-19 DIAGNOSIS — E781 Pure hyperglyceridemia: Secondary | ICD-10-CM

## 2018-06-19 DIAGNOSIS — K219 Gastro-esophageal reflux disease without esophagitis: Secondary | ICD-10-CM

## 2018-06-19 DIAGNOSIS — E559 Vitamin D deficiency, unspecified: Secondary | ICD-10-CM

## 2018-06-19 DIAGNOSIS — E119 Type 2 diabetes mellitus without complications: Secondary | ICD-10-CM

## 2018-06-19 DIAGNOSIS — I10 Essential (primary) hypertension: Secondary | ICD-10-CM | POA: Diagnosis not present

## 2018-06-19 LAB — BAYER DCA HB A1C WAIVED: HB A1C (BAYER DCA - WAIVED): 6.3 % (ref <7.0)

## 2018-06-20 LAB — CALCIUM: Calcium: 9.3 mg/dL (ref 8.7–10.3)

## 2018-06-20 LAB — HEPATIC FUNCTION PANEL
ALT: 21 IU/L (ref 0–32)
AST: 26 IU/L (ref 0–40)
Albumin: 4 g/dL (ref 3.6–4.6)
Alkaline Phosphatase: 113 IU/L (ref 39–117)
BILIRUBIN TOTAL: 0.2 mg/dL (ref 0.0–1.2)
BILIRUBIN, DIRECT: 0.07 mg/dL (ref 0.00–0.40)
Total Protein: 6.7 g/dL (ref 6.0–8.5)

## 2018-06-20 LAB — LIPID PANEL
Chol/HDL Ratio: 6.6 ratio — ABNORMAL HIGH (ref 0.0–4.4)
Cholesterol, Total: 291 mg/dL — ABNORMAL HIGH (ref 100–199)
HDL: 44 mg/dL (ref >39)
LDL Calculated: 172 mg/dL — ABNORMAL HIGH (ref 0–99)
Triglycerides: 374 mg/dL — ABNORMAL HIGH (ref 0–149)
VLDL CHOLESTEROL CAL: 75 mg/dL — AB (ref 5–40)

## 2018-06-20 LAB — CBC WITH DIFFERENTIAL/PLATELET
Basophils Absolute: 0.1 10*3/uL (ref 0.0–0.2)
Basos: 1 %
EOS (ABSOLUTE): 0.1 10*3/uL (ref 0.0–0.4)
Eos: 1 %
Hematocrit: 41.3 % (ref 34.0–46.6)
Hemoglobin: 14.3 g/dL (ref 11.1–15.9)
IMMATURE GRANULOCYTES: 2 %
Immature Grans (Abs): 0.1 10*3/uL (ref 0.0–0.1)
Lymphocytes Absolute: 2 10*3/uL (ref 0.7–3.1)
Lymphs: 24 %
MCH: 31.4 pg (ref 26.6–33.0)
MCHC: 34.6 g/dL (ref 31.5–35.7)
MCV: 91 fL (ref 79–97)
Monocytes Absolute: 1.1 10*3/uL — ABNORMAL HIGH (ref 0.1–0.9)
Monocytes: 13 %
Neutrophils Absolute: 4.9 10*3/uL (ref 1.4–7.0)
Neutrophils: 59 %
Platelets: 263 10*3/uL (ref 150–450)
RBC: 4.55 x10E6/uL (ref 3.77–5.28)
RDW: 12.8 % (ref 11.7–15.4)
WBC: 8.2 10*3/uL (ref 3.4–10.8)

## 2018-06-20 LAB — THYROID PANEL WITH TSH
Free Thyroxine Index: 2.6 (ref 1.2–4.9)
T3 Uptake Ratio: 28 % (ref 24–39)
T4, Total: 9.2 ug/dL (ref 4.5–12.0)
TSH: 1.74 u[IU]/mL (ref 0.450–4.500)

## 2018-06-20 LAB — VITAMIN D 25 HYDROXY (VIT D DEFICIENCY, FRACTURES): Vit D, 25-Hydroxy: 27.8 ng/mL — ABNORMAL LOW (ref 30.0–100.0)

## 2018-06-21 ENCOUNTER — Ambulatory Visit (INDEPENDENT_AMBULATORY_CARE_PROVIDER_SITE_OTHER): Payer: Medicare Other | Admitting: Family Medicine

## 2018-06-21 VITALS — BP 112/67 | HR 63 | Temp 97.0°F | Ht 63.0 in | Wt 167.0 lb

## 2018-06-21 DIAGNOSIS — L249 Irritant contact dermatitis, unspecified cause: Secondary | ICD-10-CM

## 2018-06-21 MED ORDER — TRIAMCINOLONE ACETONIDE 0.1 % EX CREA: 1.0000 "application " | TOPICAL_CREAM | Freq: Two times a day (BID) | CUTANEOUS | 0 refills | Status: DC

## 2018-06-21 NOTE — Patient Instructions (Signed)
Contact Dermatitis  Dermatitis is redness, soreness, and swelling (inflammation) of the skin. Contact dermatitis is a reaction to something that touches the skin.  There are two types of contact dermatitis:   Irritant contact dermatitis. This happens when something bothers (irritates) your skin, like soap.   Allergic contact dermatitis. This is caused when you are exposed to something that you are allergic to, such as poison ivy.  What are the causes?   Common causes of irritant contact dermatitis include:  ? Makeup.  ? Soaps.  ? Detergents.  ? Bleaches.  ? Acids.  ? Metals, such as nickel.   Common causes of allergic contact dermatitis include:  ? Plants.  ? Chemicals.  ? Jewelry.  ? Latex.  ? Medicines.  ? Preservatives in products, such as clothing.  What increases the risk?   Having a job that exposes you to things that bother your skin.   Having asthma or eczema.  What are the signs or symptoms?  Symptoms may happen anywhere the irritant has touched your skin. Symptoms include:   Dry or flaky skin.   Redness.   Cracks.   Itching.   Pain or a burning feeling.   Blisters.   Blood or clear fluid draining from skin cracks.  With allergic contact dermatitis, swelling may occur. This may happen in places such as the eyelids, mouth, or genitals.  How is this treated?   This condition is treated by checking for the cause of the reaction and protecting your skin. Treatment may also include:  ? Steroid creams, ointments, or medicines.  ? Antibiotic medicines or other ointments, if you have a skin infection.  ? Lotion or medicines to help with itching.  ? A bandage (dressing).  Follow these instructions at home:  Skin care   Moisturize your skin as needed.   Put cool cloths on your skin.   Put a baking soda paste on your skin. Stir water into baking soda until it looks like a paste.   Do not scratch your skin.   Avoid having things rub up against your skin.   Avoid the use of soaps, perfumes, and  dyes.  Medicines   Take or apply over-the-counter and prescription medicines only as told by your doctor.   If you were prescribed an antibiotic medicine, take or apply it as told by your doctor. Do not stop using it even if your condition starts to get better.  Bathing   Take a bath with:  ? Epsom salts.  ? Baking soda.  ? Colloidal oatmeal.   Bathe less often.   Bathe in warm water. Avoid using hot water.  Bandage care   If you were given a bandage, change it as told by your health care provider.   Wash your hands with soap and water before and after you change your bandage. If soap and water are not available, use hand sanitizer.  General instructions   Avoid the things that caused your reaction. If you do not know what caused it, keep a journal. Write down:  ? What you eat.  ? What skin products you use.  ? What you drink.  ? What you wear in the area that has symptoms. This includes jewelry.   Check the affected areas every day for signs of infection. Check for:  ? More redness, swelling, or pain.  ? More fluid or blood.  ? Warmth.  ? Pus or a bad smell.   Keep all follow-up visits as   told by your doctor. This is important.  Contact a doctor if:   You do not get better with treatment.   Your condition gets worse.   You have signs of infection, such as:  ? More swelling.  ? Tenderness.  ? More redness.  ? Soreness.  ? Warmth.   You have a fever.   You have new symptoms.  Get help right away if:   You have a very bad headache.   You have neck pain.   Your neck is stiff.   You throw up (vomit).   You feel very sleepy.   You see red streaks coming from the area.   Your bone or joint near the area hurts after the skin has healed.   The area turns darker.   You have trouble breathing.  Summary   Dermatitis is redness, soreness, and swelling of the skin.   Symptoms may occur where the irritant has touched you.   Treatment may include medicines and skin care.   If you do not know what caused  your reaction, keep a journal.   Contact a doctor if your condition gets worse or you have signs of infection.  This information is not intended to replace advice given to you by your health care provider. Make sure you discuss any questions you have with your health care provider.  Document Released: 03/07/2009 Document Revised: 11/23/2017 Document Reviewed: 11/23/2017  [employer]vier Interactive Patient Education  2019 [employer]

## 2018-06-21 NOTE — Progress Notes (Signed)
Subjective: CC: ?shingles PCP: [name], MD ZOX:[id number] Lisa Sutton is a 85 y.o. female presenting to clinic today for:  1. ?Shingles Patient reports onset of rash along her neck and chest 4 days ago.  She reports itching and burning of the rash.  She has been applying a moisturizer in efforts to improve it and taking Benadryl with little improvement in symptoms.  She is worried about shingles but notes that she is gotten both versions of the shingles vaccine.  She denies any new exposures including new perfumes, lotions, soaps, detergents, clothing, pets or foods.  No other folks in the household with similar rash.  She has no rash elsewhere on the body.   ROS: Per HPI  Allergies  Allergen Reactions  . Statins Other (See Comments)    'discomfort, aching everywhere' Tolerates to livalo   . Zetia [Ezetimibe] Other (See Comments)    Leg pain  . Zocor [Simvastatin] Other (See Comments)    Leg pain   Past Medical History:  Diagnosis Date  . Anxiety   . Arthritis   . Cataract   . Dysphagia    'sometimes but not a major issue' been checked out by GI (per pt)  . Dysrhythmia    'heart used to skip but doesn't anymore' was checked out by Dr. Ron Parker late '90s, everything checked out ok and not had any skipping since (all per pt)  . Essential hypertension, benign   . Fatty liver   . GERD (gastroesophageal reflux disease)   . Hepatitis B    had at age 43, 'GI doc said it's gone away'  . Hepatitis B surface antigen positive   . History of hiatal hernia   . Hypercalcemia   . Hypothyroidism   . Kidney stone August 2014   Patient was [facility] at [facility]  . Obesity   . Osteoporosis   . PONV (postoperative nausea and vomiting)   . Type 2 diabetes mellitus ([place])     Current Outpatient Medications:  .  ACCU-CHEK AVIVA PLUS test strip, CHECK BLOOD SUGAR TWO TIMES DAILY, Disp: 200 each, Rfl: 10 .  acetaminophen (TYLENOL) 500 MG tablet, Take 1,000 mg by mouth daily as needed for moderate  pain or headache., Disp: , Rfl:  .  ALPRAZolam (XANAX) 0.25 MG tablet, Take 1 tablet (0.25 mg total) by mouth 2 (two) times daily as needed for anxiety., Disp: 180 tablet, Rfl: 1 .  amLODipine (NORVASC) 5 MG tablet, TAKE 1 TABLET BY MOUTH  DAILY, Disp: 90 tablet, Rfl: 1 .  atenolol (TENORMIN) 50 MG tablet, TAKE 1 TABLET BY MOUTH  DAILY, Disp: 90 tablet, Rfl: 1 .  Cholecalciferol (VITAMIN D3) 1000 units CAPS, Take 1,000 Units by mouth daily. , Disp: , Rfl:  .  DULoxetine (CYMBALTA) 30 MG capsule, Take 30 mg by mouth daily., Disp: , Rfl:  .  glipiZIDE (GLUCOTROL) 5 MG tablet, Take 2.5 mg by mouth daily with supper., Disp: , Rfl:  .  Icosapent Ethyl (VASCEPA) 1 g CAPS, Take 2 capsules (2 g total) by mouth 2 (two) times daily., Disp: 120 capsule, Rfl: 3 .  irbesartan (AVAPRO) 150 MG tablet, TAKE 1 TABLET BY MOUTH  DAILY, Disp: 90 tablet, Rfl: 1 .  levothyroxine (SYNTHROID, LEVOTHROID) 112 MCG tablet, Take 1 tablet (112 mcg total) by mouth daily., Disp: 90 tablet, Rfl: 3 .  levothyroxine (SYNTHROID, LEVOTHROID) 25 MCG tablet, TAKE 1 TABLET BY MOUTH  DAILY BEFORE BREAKFAST on MON, WED and FRI only, Disp: 36  tablet, Rfl: 3 .  Multiple Vitamin (MULTIVITAMIN WITH MINERALS) TABS tablet, Take 1 tablet by mouth daily., Disp: , Rfl:  .  pantoprazole (PROTONIX) 40 MG tablet, Take 1 tablet (40 mg total) by mouth daily before breakfast. (Patient taking differently: Take 40 mg by mouth as needed (acid reflux). ), Disp: 90 tablet, Rfl: 3 Social History   Socioeconomic History  . Marital status: Widowed    Spouse name: Not on file  . Number of children: Not on file  . Years of education: Not on file  . Highest education level: Not on file  Occupational History  . Not on file  Social Needs  . Financial resource strain: Not on file  . Food insecurity:    Worry: Not on file    Inability: Not on file  . Transportation needs:    Medical: Not on file    Non-medical: Not on file  Tobacco Use  . Smoking status:  Never Smoker  . Smokeless tobacco: Never Used  Substance and Sexual Activity  . Alcohol use: No    Alcohol/week: 0.0 standard drinks  . Drug use: No  . Sexual activity: Never  Lifestyle  . Physical activity:    Days per week: Not on file    Minutes per session: Not on file  . Stress: Not on file  Relationships  . Social connections:    Talks on phone: Not on file    Gets together: Not on file    Attends religious service: Not on file    Active member of club or organization: Not on file    Attends meetings of clubs or organizations: Not on file    Relationship status: Not on file  . Intimate partner violence:    Fear of current or ex partner: Not on file    Emotionally abused: Not on file    Physically abused: Not on file    Forced sexual activity: Not on file  Other Topics Concern  . Not on file  Social History Narrative  . Not on file   Family History  Problem Relation Age of Onset  . Hypertension Father   . Hip fracture Father   . COPD Mother   . Cancer Maternal Aunt   . Diabetes Maternal Aunt   . Diabetes Maternal Uncle   . Colon cancer Neg Hx     Objective: Office vital signs reviewed. BP 112/67   Pulse 63   Temp (!) 97 F (36.1 C) (Oral)   Ht 5\' 3"  (1.6 m)   Wt 167 lb (75.8 kg)   BMI 29.58 kg/m   Physical Examination:  General: Awake, alert, well nourished, No acute distress Skin: Mildly erythematous, dry, maculopapular rash noted along the dcolletage and neck anteriorly.  There is no associated induration, fluctuance or exudates.  Certainly no pustules or vesicles.  Assessment/ Plan: 85 y.o. female   1. Irritant contact dermatitis, unspecified trigger Clinically consistent with a contact dermatitis unknown trigger.  We discussed oral antihistamines and use of topical corticosteroid.  Triamcinolone applied to the affected areas only twice daily for the next 7 to 10 days.  If no resolution or any worsening she is to return for reevaluation.  Follow-up  urine   No orders of the defined types were placed in this encounter.  Meds ordered this encounter  Medications  . triamcinolone cream (KENALOG) 0.1 %    Sig: Apply 1 application topically 2 (two) times daily. x7-10 days    Dispense:  30  g    Refill:  [place], DO [place] [phone]

## 2018-06-22 ENCOUNTER — Encounter: Payer: Self-pay | Admitting: Internal Medicine

## 2018-06-22 ENCOUNTER — Ambulatory Visit: Payer: Medicare Other | Admitting: Internal Medicine

## 2018-06-22 VITALS — BP 110/60 | HR 67 | Ht 63.0 in | Wt 167.0 lb

## 2018-06-22 DIAGNOSIS — E034 Atrophy of thyroid (acquired): Secondary | ICD-10-CM

## 2018-06-22 DIAGNOSIS — G63 Polyneuropathy in diseases classified elsewhere: Secondary | ICD-10-CM | POA: Diagnosis not present

## 2018-06-22 DIAGNOSIS — E1121 Type 2 diabetes mellitus with diabetic nephropathy: Secondary | ICD-10-CM | POA: Diagnosis not present

## 2018-06-22 NOTE — Patient Instructions (Addendum)
Please continue: - Glipizide 2.5 mg only before a larger dinner or if you have dessert  Try to add: - Metformin ER 500 mg with dinner.  Please return in 6 months with your sugar log.

## 2018-06-22 NOTE — Progress Notes (Signed)
Patient ID: Lisa Sutton, female   DOB: 10/02/1933, 85 y.o.   MRN: [id number]   HPI: Lisa Sutton is a 85 y.o.-year-old female whom I previously saw for hypercalcemia in 2016, now returning for DM2 (new condition for me), dx in 2015, non-insulin-dependent, uncontrolled, with complications (CKD, peripheral neuropathy).  Last visit 4 months ago.  She continues to go to [employer] 3 times a week.  Last hemoglobin A1c was: Lab Results  Component Value Date   HGBA1C 6.3 06/19/2018   HGBA1C 6.4 02/15/2018   HGBA1C 6.2 10/07/2017  02/15/2018: HbA1c 6.4% 10/07/2017: HbA1c 6.2% 06/10/2017: HbA1c 7.0% 01/2017: HbA1c 6.1%   She is on: - Glipizide 2.5 mg only before a larger dinner or if you have dessert - rarely has to take it - Cinnamon 2 tablets in am She stopped metformin ER due to diarrhea. She could not afford Januvia.  Pt checks her sugars 2 times a day: - am: 107-132, 150, 161 >> 110-120, 134 >> 111, 122-145 - 2h after b'fast: 141-252 >> 122-145 >> n/c - before lunch:   100-136 >> n/c >> 84 >> n/c - 2h after lunch:  n/c >> 140s, 170-180 >> n/c - before dinner: 109 >> 90-127 >> n/c - 2h after dinner: 105-145 >> 73, 89, 120-169 >> n/c >> 101, 119-162, 182 - bedtime: 89-126 >> 140-150 >> n/c - nighttime: n/c >> 129, 150 >> n/c Lowest sugar was 73 >> 101; she has hypoglycemia awareness in the 70s. Highest sugar was 200x1 >> 182.   Glucometer: Accu-Chek Aviva  Pt's meals are: - Breakfast: 2 boiled eggs + toast + tomato or croissants + am and egg - Lunch: [place] sandwich + lettuce + tomato + mayo - Dinner: green beans, slaw + potatoes + meatloaf   - Snacks: crackers + PB Drinks diet green tea.  -+ CKD-sees nephrology, last BUN/creatinine:  Lab Results  Component Value Date   BUN 26 02/15/2018   BUN 23 10/07/2017   CREATININE 1.28 (H) 02/15/2018   CREATININE 1.27 (H) 10/07/2017  On irbesartan.  -+ HL; last set of lipids: Lab Results  Component Value Date   CHOL  291 (H) 06/19/2018   HDL 44 06/19/2018   LDLCALC 172 (H) 06/19/2018   LDLDIRECT 99 11/01/2016   TRIG 374 (H) 06/19/2018   CHOLHDL 6.6 (H) 06/19/2018  Off statins b/c leg cramps.   Recently PCP recommended Vascepa (given samples), however, she cannot afford this. - last eye exam was in 10/2017: No DR -She has numbness and tingling in her feet.  Previously on Neurontin, which she stopped due to weight gain; now on B complex and alpha lipoic acid.  Also added Cymbalta -takes this every other day.  Hypothyroidism: - On LT4 112 mcg daily + an extra 25 mcg Monday Wednesday Friday  Latest TSH normal 3 days ago: Lab Results  Component Value Date   TSH 1.740 06/19/2018   She has a history of secondary (non-PTH mediated) hypercalcemia, due to HCTZ, which normalized after stopping the medication: Lab Results  Component Value Date   PTH 11 (L) 06/04/2015   PTH Comment 06/04/2015   PTH 11 (L) 05/29/2015   PTH Comment 05/29/2015   PTH 6 (L) 01/13/2015   PTH Comment 01/13/2015   PTH 12 (L) 12/02/2014   PTH 5 (L) 11/26/2014   CALCIUM 9.3 06/19/2018   CALCIUM 9.4 02/15/2018   CALCIUM 9.8 10/07/2017   CALCIUM 9.6 06/10/2017   CALCIUM 9.4 01/27/2017   CALCIUM 10.7 (  H) 01/21/2017   CALCIUM 10.2 11/01/2016   CALCIUM 9.5 09/03/2016   CALCIUM 9.7 04/21/2016   CALCIUM 9.3 09/18/2015   CALCIUM 8.9 09/17/2015   CALCIUM 8.6 (L) 09/16/2015   CALCIUM 9.6 09/08/2015   CALCIUM 10.5 (H) 09/03/2015   CALCIUM 9.3 06/04/2015   CALCIUM CANCELED 05/29/2015   CALCIUM CANCELED 05/27/2015   CALCIUM 11.0 (H) 04/28/2015   CALCIUM 11.8 (H) 04/26/2015   CALCIUM 10.2 03/13/2015   Vitamin D levels was slightly  low at last check: Lab Results  Component Value Date   VD25OH 27.8 (L) 06/19/2018   VD25OH 33.4 02/15/2018   VD25OH 32.6 10/07/2017   VD25OH 38.7 06/10/2017   VD25OH 40.5 01/21/2017   VD25OH 32.9 09/03/2016   VD25OH 63.1 05/27/2015   VD25OH 37.8 03/13/2015   VD25OH 41.1 02/17/2015   VD25OH  53.3 10/17/2014   On 1000 units vit D daily.  Stopped Premarin 5 years ago. Had hot flushes.   ROS: Constitutional: no weight gain/no weight loss, no fatigue, no subjective hyperthermia, no subjective hypothermia Eyes: no blurry vision, no xerophthalmia ENT: no sore throat, no nodules palpated in neck, no dysphagia, no odynophagia, no hoarseness Cardiovascular: no CP/no SOB/no palpitations/no leg swelling Respiratory: no cough/no SOB/no wheezing Gastrointestinal: no N/no V/no D/no C/no acid reflux Musculoskeletal: no muscle aches/no joint aches Skin: no rashes, no hair loss Neurological: no tremors/+ numbness/+ tingling/no dizziness  I reviewed pt's medications, allergies, PMH, social hx, family hx, and changes were documented in the history of present illness. Otherwise, unchanged from my initial visit note.  Past Medical History:  Diagnosis Date  . Anxiety   . Arthritis   . Cataract   . Dysphagia    'sometimes but not a major issue' been checked out by GI (per pt)  . Dysrhythmia    'heart used to skip but doesn't anymore' was checked out by Dr. Ron Parker late '90s, everything checked out ok and not had any skipping since (all per pt)  . Essential hypertension, benign   . Fatty liver   . GERD (gastroesophageal reflux disease)   . Hepatitis B    had at age 73, 'GI doc said it's gone away'  . Hepatitis B surface antigen positive   . History of hiatal hernia   . Hypercalcemia   . Hypothyroidism   . Kidney stone August 2014   Patient [facility] at [facility]  . Obesity   . Osteoporosis   . PONV (postoperative nausea and vomiting)   . Type 2 diabetes mellitus ([place])    Past Surgical History:  Procedure Laterality Date  . ABDOMINAL HYSTERECTOMY    . BILATERAL CARPAL TUNNEL RELEASE     Dr. Marlou Sa at surgical center  . BIOPSY  05/26/2017   Procedure: BIOPSY;  Surgeon: Rogene Houston, MD;  [facility];  Service: Endoscopy;;  colon  . CATARACT EXTRACTION, BILATERAL    .  COLONOSCOPY N/A 05/26/2017   Procedure: COLONOSCOPY;  Surgeon: Rogene Houston, MD;  [facility];  Service: Endoscopy;  Laterality: N/A;  10:30  . Deviated septum repair    . ESOPHAGOGASTRODUODENOSCOPY N/A 07/28/2016   Procedure: ESOPHAGOGASTRODUODENOSCOPY (EGD);  Surgeon: Rogene Houston, MD;  Location: [facility];  Service: Endoscopy;  Laterality: N/A;  300 - moved to 3/7 @ 1:00  . EYE SURGERY    . KNEE ARTHROPLASTY Left 09/15/2015   Procedure: COMPUTER ASSISTED TOTAL KNEE ARTHROPLASTY;  Surgeon: Marybelle Killings, MD;  Location: [place];  Service: Orthopedics;  Laterality: Left;  . KNEE ARTHROSCOPY     left knee 2003   Social History   Socioeconomic History  . Marital status: Widowed    Spouse name: Not on file  . Number of children: Not on file  . Years of education: Not on file  . Highest education level: Not on file  Occupational History  . Not on file  Social Needs  . Financial resource strain: Not on file  . Food insecurity:    Worry: Not on file    Inability: Not on file  . Transportation needs:    Medical: Not on file    Non-medical: Not on file  Tobacco Use  . Smoking status: Never Smoker  . Smokeless tobacco: Never Used  Substance and Sexual Activity  . Alcohol use: No    Alcohol/week: 0.0 standard drinks  . Drug use: No  . Sexual activity: Never  Lifestyle  . Physical activity:    Days per week: Not on file    Minutes per session: Not on file  . Stress: Not on file  Relationships  . Social connections:    Talks on phone: Not on file    Gets together: Not on file    Attends religious service: Not on file    Active member of club or organization: Not on file    Attends meetings of clubs or organizations: Not on file    Relationship status: Not on file  . Intimate partner violence:    Fear of current or ex partner: Not on file    Emotionally abused: Not on file    Physically abused: Not on file    Forced sexual activity: Not on file  Other Topics  Concern  . Not on file  Social History Narrative  . Not on file   Current Outpatient Medications on File Prior to Visit  Medication Sig Dispense Refill  . ACCU-CHEK AVIVA PLUS test strip CHECK BLOOD SUGAR TWO TIMES DAILY 200 each 10  . acetaminophen (TYLENOL) 500 MG tablet Take 1,000 mg by mouth daily as needed for moderate pain or headache.    . ALPRAZolam (XANAX) 0.25 MG tablet Take 1 tablet (0.25 mg total) by mouth 2 (two) times daily as needed for anxiety. 180 tablet 1  . amLODipine (NORVASC) 5 MG tablet TAKE 1 TABLET BY MOUTH  DAILY 90 tablet 1  . atenolol (TENORMIN) 50 MG tablet TAKE 1 TABLET BY MOUTH  DAILY 90 tablet 1  . Cholecalciferol (VITAMIN D3) 1000 units CAPS Take 1,000 Units by mouth daily.     . DULoxetine (CYMBALTA) 30 MG capsule Take 30 mg by mouth daily.    Marland Kitchen glipiZIDE (GLUCOTROL) 5 MG tablet Take 2.5 mg by mouth daily with supper.    Vanessa Kick Ethyl (VASCEPA) 1 g CAPS Take 2 capsules (2 g total) by mouth 2 (two) times daily. 120 capsule 3  . irbesartan (AVAPRO) 150 MG tablet TAKE 1 TABLET BY MOUTH  DAILY 90 tablet 1  . levothyroxine (SYNTHROID, LEVOTHROID) 112 MCG tablet Take 1 tablet (112 mcg total) by mouth daily. 90 tablet 3  . levothyroxine (SYNTHROID, LEVOTHROID) 25 MCG tablet TAKE 1 TABLET BY MOUTH  DAILY BEFORE BREAKFAST on MON, WED and FRI only 36 tablet 3  . Multiple Vitamin (MULTIVITAMIN WITH MINERALS) TABS tablet Take 1 tablet by mouth daily.    . pantoprazole (PROTONIX) 40 MG tablet Take 1 tablet (40 mg total) by mouth daily before breakfast. (Patient taking differently: Take 40 mg by  mouth as needed (acid reflux). ) 90 tablet 3  . triamcinolone cream (KENALOG) 0.1 % Apply 1 application topically 2 (two) times daily. x7-10 days 30 g 0  . [DISCONTINUED] calcium citrate-vitamin D 200-200 MG-UNIT TABS Take 1 tablet by mouth daily.       No current facility-administered medications on file prior to visit.    Allergies  Allergen Reactions  . Statins Other (See  Comments)    'discomfort, aching everywhere' Tolerates to livalo   . Zetia [Ezetimibe] Other (See Comments)    Leg pain  . Zocor [Simvastatin] Other (See Comments)    Leg pain   Family History  Problem Relation Age of Onset  . Hypertension Father   . Hip fracture Father   . COPD Mother   . Cancer Maternal Aunt   . Diabetes Maternal Aunt   . Diabetes Maternal Uncle   . Colon cancer Neg Hx     PE: BP 110/60   Pulse 67   Ht 5\' 3"  (1.6 m) Comment: measured  Wt 167 lb (75.8 kg)   SpO2 96%   BMI 29.58 kg/m  Wt Readings from Last 3 Encounters:  06/22/18 167 lb (75.8 kg)  06/21/18 167 lb (75.8 kg)  02/27/18 164 lb 11.2 oz (74.7 kg)   Constitutional: overweight, in NAD Eyes: PERRLA, EOMI, no exophthalmos ENT: moist mucous membranes, no thyromegaly, no cervical lymphadenopathy Cardiovascular: RRR, No MRG Respiratory: CTA B Gastrointestinal: abdomen soft, NT, ND, BS+ Musculoskeletal: no deformities, strength intact in all 4 Skin: moist, warm, no rashes Neurological: no tremor with outstretched hands, DTR normal in all 4  ASSESSMENT: 1. DM2, non-insulin-dependent, uncontrolled, with complications - CKD - PN  2. Peripheral neuropathy secondary to diabetes  3. Hypercalcemia  4. Hypothyroidism  PLAN:  1. Patient with history of controlled diabetes, on glipizide only now, previously also on metformin.  We had to reduce the dose of metformin and then stop due to diarrhea.  Her sugars improved significantly before last visit after she stopped Glucerna and started to change her diet so that we can now only use glipizide with larger dinners.  At last visit, HbA1c was 6.4%.  At this visit, she just had an HbA1c which was excellent, at 6.3%, lower than before. - At this visit, sugars are slightly higher than before and we discussed about the possibility of adding 1 tablet of metformin ER with dinner to improve a.m. sugars.  She had diarrhea from higher dosing in the past so she agrees  to restart this low dose.  We will continue glipizide before a larger meal, taken as needed. - Lipid panel showed very high LDL - cannot tolerate statins >> I wonder if she qualifies for PCSK9 inh >> may need a referral to Lipid clinic (per PCP) - I suggested to:  Patient Instructions  Please continue: - Glipizide 2.5 mg only before a larger dinner or if you have dessert  Try to add: - Metformin ER 500 mg with dinner.  Please return in 6 months with your sugar log.   - continue checking sugars at different times of the day - check 1x a day, rotating checks - advised for yearly eye exams >> she is UTD - Return to clinic in 6 mo with sugar log      2.  Peripheral neuropathy secondary to diabetes -Stable -She stopped Neurontin due to concerns for weight gain -Continues alpha-lipoic acid, B complex, Cymbalta - qod >> discussed she may need to take this every  day  3. Hypercalcemia -Mild, now resolved per review of the latest labs from few days ago -She had suppressed PTH and normal vitamin D levels at last check -This was probably secondary to HCTZ that she is now off the medication.  4. Hypothyroidism -Well-controlled: Last TSH normal 3 days ago: Lab Results  Component Value Date   TSH 1.740 06/19/2018    Philemon Kingdom, MD PhD [facility]

## 2018-06-29 ENCOUNTER — Ambulatory Visit (INDEPENDENT_AMBULATORY_CARE_PROVIDER_SITE_OTHER): Payer: Medicare Other | Admitting: Family Medicine

## 2018-06-29 ENCOUNTER — Encounter: Payer: Self-pay | Admitting: Family Medicine

## 2018-06-29 VITALS — BP 118/72 | HR 64 | Temp 97.4°F | Ht 63.0 in | Wt 167.0 lb

## 2018-06-29 DIAGNOSIS — E039 Hypothyroidism, unspecified: Secondary | ICD-10-CM

## 2018-06-29 DIAGNOSIS — E1140 Type 2 diabetes mellitus with diabetic neuropathy, unspecified: Secondary | ICD-10-CM

## 2018-06-29 DIAGNOSIS — E119 Type 2 diabetes mellitus without complications: Secondary | ICD-10-CM | POA: Diagnosis not present

## 2018-06-29 DIAGNOSIS — E781 Pure hyperglyceridemia: Secondary | ICD-10-CM

## 2018-06-29 DIAGNOSIS — Z789 Other specified health status: Secondary | ICD-10-CM

## 2018-06-29 DIAGNOSIS — I10 Essential (primary) hypertension: Secondary | ICD-10-CM

## 2018-06-29 DIAGNOSIS — K219 Gastro-esophageal reflux disease without esophagitis: Secondary | ICD-10-CM

## 2018-06-29 DIAGNOSIS — N1832 Chronic kidney disease, stage 3b: Secondary | ICD-10-CM

## 2018-06-29 DIAGNOSIS — E559 Vitamin D deficiency, unspecified: Secondary | ICD-10-CM | POA: Diagnosis not present

## 2018-06-29 DIAGNOSIS — N183 Chronic kidney disease, stage 3 (moderate): Secondary | ICD-10-CM

## 2018-06-29 MED ORDER — DULOXETINE HCL 30 MG PO CPEP: 30.0000 mg | ORAL_CAPSULE | Freq: Every day | ORAL | 3 refills | Status: DC

## 2018-06-29 MED ORDER — ALPRAZOLAM 0.25 MG PO TABS: 0.2500 mg | ORAL_TABLET | Freq: Two times a day (BID) | ORAL | 1 refills | Status: DC | PRN

## 2018-06-29 MED ORDER — ATENOLOL 50 MG PO TABS: 50.0000 mg | ORAL_TABLET | Freq: Every day | ORAL | 3 refills | Status: DC

## 2018-06-29 NOTE — Patient Instructions (Addendum)
Medicare Annual Wellness Visit  [facility] and the medical providers at [facility] strive to bring you the best medical care.  In doing so we not only want to address your current medical conditions and concerns but also to detect new conditions early and prevent illness, disease and health-related problems.    Medicare offers a yearly Wellness Visit which allows our clinical staff to assess your need for preventative services including immunizations, lifestyle education, counseling to decrease risk of preventable diseases and screening for fall risk and other medical concerns.    This visit is provided free of charge (no copay) for all Medicare recipients. The clinical pharmacists at [facility] have begun to conduct these Wellness Visits which will also include a thorough review of all your medications.    As you primary medical provider recommend that you make an appointment for your Annual Wellness Visit if you have not done so already this year.  You may set up this appointment before you leave today or you may call back ([phone]) and schedule an appointment.  Please make sure when you call that you mention that you are scheduling your Annual Wellness Visit with the clinical pharmacist so that the appointment may be made for the proper length of time.     Continue current medications. Continue good therapeutic lifestyle changes which include good diet and exercise. Fall precautions discussed with patient. If an FOBT was given today- please return it to our front desk. If you are over 35 years old - you may need Prevnar 12 or the adult Pneumonia vaccine.  **Flu shots are available--- please call and schedule a FLU-CLINIC appointment**  After your visit with [place] today you will receive a survey in the mail or online from [employer] regarding your care with [place]. Please take a moment to fill this out. Your feedback is very  important to [place] as you can help [place] better understand your patient needs as well as improve your experience and satisfaction. WE CARE ABOUT YOU!!!   Continue to follow-up with gastroenterology as planned Continue to follow-up with endocrinology as planned We will schedule you for a visit with the clinical pharmacist to discuss your elevated triglycerides and LDL-C and get her recommendations for this Keep your yearly exam with the ophthalmologist Add melatonin 3 mg to the current Xanax to try for sleep at nighttime Continue with Cymbalta but take it regularly instead of every other day at 30 mg daily. Get your mammogram as planned next week We will also arrange for you just to have a pelvic exam with 1 of the midlevel's in our office.

## 2018-06-29 NOTE — Progress Notes (Signed)
Subjective:    Patient ID: Lisa Sutton, female    DOB: [birth year], 85 y.o.   MRN: [id number]  HPI Pt here for follow up and management of chronic medical problems which includes diabetes, hypertension and hypothyroid. She is taking medication regularly.  The patient today complains of insomnia.  She is on Xanax.  She is followed regularly by the endocrinologist, Dr. Letta Median.  She recently saw her.  An attempt was made to add back low-dose extended release metformin but the patient apparently does not tolerate that.  She has also been seeing the gastroenterologist and he was pleased with everything and he last saw her late in 2000 2019.  She has had lab work done and we will review this with her during the visit today.  The hemoglobin A1c was excellent at 6.3%.  The serum calcium was good at 9.3.  All thyroid tests were normal.  All liver function tests were normal.  The vitamin D level was low and previously was 33.4.  The CBC had a normal white blood cell count with a good hemoglobin at 14.3 and an adequate platelet count.  This lab work was actually done on the 27th and she saw the endocrinologist after this visit.  The patient's vital signs are stable.  She recently saw 1 of our providers for an irritant dermatitis.  The patient has diabetes, GERD, hypertension and chronic kidney disease along with hypothyroidism.  Patient is pleasant and doing well.  She has not started the additional dose of metformin and does not know how it will affect her.  She has had problems with statin drugs in the past and is willing to try these again but has problems with elevated LDL-C and triglycerides's.  We will have her meet with the clinical pharmacist to discuss the possibility of low-dose Crestor and Vascepa but after she has first tried the metformin.  She denies any chest pain or shortness of breath.  She does have occasional reflux and only takes the omeprazole if needed and I recommended she should take it at  least once a day regularly.  She denies any blood in the stool or black tarry bowel movements.  She is passing her water well just frequent C.  She is not taking the Cymbalta regularly and she will increase this to once every day before we upped the dose to 60 mg to help her peripheral neuropathy.  She is questioning about increasing the Xanax and I told her I did not want to do that.  She could add melatonin 3 mg to her current dose of Xanax.   Patient Active Problem List   Diagnosis Date Noted  . Chronic kidney disease (CKD) stage G3b/A1, moderately decreased glomerular filtration rate (GFR) between 30-44 mL/min/1.73 square meter and albuminuria creatinine ratio less than 30 mg/g (HCC) 06/15/2017  . Diarrhea 05/09/2017  . History of colonic polyps 05/09/2017  . H/O hypercalcemia 04/12/2017  . Upper airway cough syndrome 09/14/2016  . Hemoptysis 07/16/2016  . Acute pain of right knee 06/02/2016  . Hypothyroidism due to acquired atrophy of thyroid 04/26/2016  . Vitamin D deficiency 04/26/2016  . Status post total left knee replacement 09/15/2015  . Statin intolerance 09/11/2015  . Peripheral neuropathy 07/30/2015  . Abnormal chest x-ray 11/27/2014  . Protein-calorie malnutrition, severe ([place]) 11/27/2014  . AKI (acute kidney injury) ([place]) 11/26/2014  . Hypercalcemia 11/26/2014  . Leukocytosis 11/26/2014  . Weight loss 11/26/2014  . Weakness 11/26/2014  . Insomnia 04/12/2013  .  Hyperlipidemia 04/12/2013  . Essential hypertension, benign 10/13/2012  . GERD (gastroesophageal reflux disease) 04/25/2012  . Fatty liver 08/09/2011  . Hepatitis B 08/09/2011  . Dysphagia 08/09/2011  . Type 2 diabetes mellitus with diabetic nephropathy, without long-term current use of insulin ([place]) 08/09/2011   Outpatient Encounter Medications as of 06/29/2018  Medication Sig  . ACCU-CHEK AVIVA PLUS test strip CHECK BLOOD SUGAR TWO TIMES DAILY  . acetaminophen (TYLENOL) 500 MG tablet Take 1,000 mg by mouth  daily as needed for moderate pain or headache.  . ALPRAZolam (XANAX) 0.25 MG tablet Take 1 tablet (0.25 mg total) by mouth 2 (two) times daily as needed for anxiety.  Marland Kitchen amLODipine (NORVASC) 5 MG tablet TAKE 1 TABLET BY MOUTH  DAILY  . atenolol (TENORMIN) 50 MG tablet TAKE 1 TABLET BY MOUTH  DAILY  . Cholecalciferol (VITAMIN D3) 1000 units CAPS Take 1,000 Units by mouth daily.   . DULoxetine (CYMBALTA) 30 MG capsule Take 30 mg by mouth daily.  Marland Kitchen glipiZIDE (GLUCOTROL) 5 MG tablet Take 2.5 mg by mouth daily with supper.  Vanessa Kick Ethyl (VASCEPA) 1 g CAPS Take 2 capsules (2 g total) by mouth 2 (two) times daily.  . irbesartan (AVAPRO) 150 MG tablet TAKE 1 TABLET BY MOUTH  DAILY  . levothyroxine (SYNTHROID, LEVOTHROID) 112 MCG tablet Take 1 tablet (112 mcg total) by mouth daily.  Marland Kitchen levothyroxine (SYNTHROID, LEVOTHROID) 25 MCG tablet TAKE 1 TABLET BY MOUTH  DAILY BEFORE BREAKFAST on MON, WED and FRI only  . Multiple Vitamin (MULTIVITAMIN WITH MINERALS) TABS tablet Take 1 tablet by mouth daily.  . pantoprazole (PROTONIX) 40 MG tablet Take 1 tablet (40 mg total) by mouth daily before breakfast. (Patient taking differently: Take 40 mg by mouth as needed (acid reflux). )  . triamcinolone cream (KENALOG) 0.1 % Apply 1 application topically 2 (two) times daily. x7-10 days  . [DISCONTINUED] calcium citrate-vitamin D 200-200 MG-UNIT TABS Take 1 tablet by mouth daily.     No facility-administered encounter medications on file as of 06/29/2018.      Marland Kitchen Review of Systems  Constitutional: Negative.        Not sleeping well   HENT: Negative.   Eyes: Negative.   Respiratory: Negative.   Cardiovascular: Negative.   Gastrointestinal: Negative.   Endocrine: Negative.   Genitourinary: Negative.   Musculoskeletal: Negative.   Skin: Negative.   Allergic/Immunologic: Negative.   Neurological: Negative.   Hematological: Negative.   Psychiatric/Behavioral: Negative.        Objective:   Physical  Exam Vitals signs and nursing note reviewed.  Constitutional:      General: She is not in acute distress.    Appearance: Normal appearance. She is well-developed. She is not ill-appearing.     Comments: The patient is pleasant and alert and doing well if she can just get her cholesterol numbers down  HENT:     Head: Normocephalic and atraumatic.     Right Ear: Tympanic membrane, ear canal and external ear normal. There is no impacted cerumen.     Left Ear: Tympanic membrane, ear canal and external ear normal. There is no impacted cerumen.     Nose: Nose normal. No congestion.     Mouth/Throat:     Mouth: Mucous membranes are dry.     Pharynx: Oropharynx is clear. No oropharyngeal exudate or posterior oropharyngeal erythema.  Eyes:     General: No scleral icterus.       Right eye: No discharge.  Left eye: No discharge.     Extraocular Movements: Extraocular movements intact.     Conjunctiva/sclera: Conjunctivae normal.     Pupils: Pupils are equal, round, and reactive to light.     Comments: Sees ophthalmologist yearly.  Neck:     Musculoskeletal: Normal range of motion and neck supple.     Thyroid: No thyromegaly.     Vascular: No carotid bruit or JVD.  Cardiovascular:     Rate and Rhythm: Normal rate and regular rhythm.     Pulses: Normal pulses.     Heart sounds: Normal heart sounds. No murmur.     Comments: Heart is regular at 60/min with no edema in her feet and good pedal pulses Pulmonary:     Effort: Pulmonary effort is normal.     Breath sounds: Normal breath sounds. No wheezing or rales.     Comments: Clear anteriorly and posteriorly Abdominal:     General: Abdomen is flat. Bowel sounds are normal.     Palpations: Abdomen is soft. There is no mass.     Tenderness: There is no abdominal tenderness.     Comments: No masses tenderness organ enlargement or bruits  Musculoskeletal: Normal range of motion.        General: No tenderness.     Right lower leg: No edema.      Left lower leg: No edema.  Lymphadenopathy:     Cervical: No cervical adenopathy.  Skin:    General: Skin is warm and dry.     Findings: No rash.  Neurological:     General: No focal deficit present.     Mental Status: She is alert and oriented to person, place, and time. Mental status is at baseline.     Cranial Nerves: No cranial nerve deficit.     Sensory: No sensory deficit.     Deep Tendon Reflexes: Reflexes are normal and symmetric. Reflexes normal.  Psychiatric:        Behavior: Behavior normal.        Thought Content: Thought content normal.        Judgment: Judgment normal.    BP 118/72 (BP Location: Left Arm)   Pulse 64   Temp (!) 97.4 F (36.3 C) (Oral)   Ht 5\' 3"  (1.6 m)   Wt 167 lb (75.8 kg)   BMI 29.58 kg/m         Assessment & Plan:  1. Type 2 diabetes mellitus without complication, without long-term current use of insulin (HCC) -Continue to follow-up with endocrinology -Add back metformin extended release low-dose at bedtime as recommended by her.  2. Hypothyroidism, unspecified type -Thyroid tests were good and she will continue with current treatment  3. Essential hypertension, benign -Blood pressure is good and she will continue with current treatment  4. Hypertriglyceridemia -Meet with clinical pharmacist and discuss low-dose statin and Vascepa  5. Vitamin D deficiency -Continue with vitamin D replacement as recommended by endocrinologist at 2000 of D3 on Saturday and Sunday and 1000 Monday through Friday  6. Gastroesophageal reflux disease without esophagitis -Follow-up with gastroenterology as planned and take omeprazole at least once daily on a more regular basis  7. Chronic kidney disease (CKD) stage G3b/A1, moderately decreased glomerular filtration rate (GFR) between 30-44 mL/min/1.73 square meter and albuminuria creatinine ratio less than 30 mg/g (HCC) -Avoid all NSAIDs and keep blood pressure under best control possible  8. Statin  intolerance -Appointment with clinical pharmacy  9. Type 2 diabetes mellitus with diabetic  neuropathy, without long-term current use of insulin ([place]) -Continue with Cymbalta but take more regularly  Meds ordered this encounter  Medications  . ALPRAZolam (XANAX) 0.25 MG tablet    Sig: Take 1 tablet (0.25 mg total) by mouth 2 (two) times daily as needed for anxiety.    Dispense:  180 tablet    Refill:  1  . atenolol (TENORMIN) 50 MG tablet    Sig: Take 1 tablet (50 mg total) by mouth daily.    Dispense:  90 tablet    Refill:  3  . DULoxetine (CYMBALTA) 30 MG capsule    Sig: Take 1 capsule (30 mg total) by mouth daily.    Dispense:  90 capsule    Refill:  3   Patient Instructions                       Medicare Annual Wellness Visit  [facility] and the medical providers at [facility] strive to bring you the best medical care.  In doing so we not only want to address your current medical conditions and concerns but also to detect new conditions early and prevent illness, disease and health-related problems.    Medicare offers a yearly Wellness Visit which allows our clinical staff to assess your need for preventative services including immunizations, lifestyle education, counseling to decrease risk of preventable diseases and screening for fall risk and other medical concerns.    This visit is provided free of charge (no copay) for all Medicare recipients. The clinical pharmacists at [facility] have begun to conduct these Wellness Visits which will also include a thorough review of all your medications.    As you primary medical provider recommend that you make an appointment for your Annual Wellness Visit if you have not done so already this year.  You may set up this appointment before you leave today or you may call back ([phone]) and schedule an appointment.  Please make sure when you call that you mention that you are scheduling your  Annual Wellness Visit with the clinical pharmacist so that the appointment may be made for the proper length of time.     Continue current medications. Continue good therapeutic lifestyle changes which include good diet and exercise. Fall precautions discussed with patient. If an FOBT was given today- please return it to our front desk. If you are over 61 years old - you may need Prevnar 58 or the adult Pneumonia vaccine.  **Flu shots are available--- please call and schedule a FLU-CLINIC appointment**  After your visit with [place] today you will receive a survey in the mail or online from [employer] regarding your care with [place]. Please take a moment to fill this out. Your feedback is very important to [place] as you can help [place] better understand your patient needs as well as improve your experience and satisfaction. WE CARE ABOUT YOU!!!   Continue to follow-up with gastroenterology as planned Continue to follow-up with endocrinology as planned We will schedule you for a visit with the clinical pharmacist to discuss your elevated triglycerides and LDL-C and get her recommendations for this Keep your yearly exam with the ophthalmologist Add melatonin 3 mg to the current Xanax to try for sleep at nighttime Continue with Cymbalta but take it regularly instead of every other day at 30 mg daily. Get your mammogram as planned next week We will also arrange for you just to have a pelvic exam  with 1 of the midlevel's in our office.   Arrie Senate MD

## 2018-07-03 ENCOUNTER — Encounter: Payer: Self-pay | Admitting: Nurse Practitioner

## 2018-07-03 ENCOUNTER — Ambulatory Visit (INDEPENDENT_AMBULATORY_CARE_PROVIDER_SITE_OTHER): Payer: Medicare Other | Admitting: Nurse Practitioner

## 2018-07-03 VITALS — BP 112/60 | HR 62 | Temp 96.8°F | Ht 63.0 in | Wt 169.0 lb

## 2018-07-03 DIAGNOSIS — Z01419 Encounter for gynecological examination (general) (routine) without abnormal findings: Secondary | ICD-10-CM

## 2018-07-03 NOTE — Progress Notes (Signed)
   Subjective:    Patient ID: Lisa Sutton, female    DOB: 24-Dec-1933, 85 y.o.   MRN: [id number]   Chief Complaint: Pap only  HPI Patient is a regular patient s of Lisa Sutton, who was last seen for follow up on 06/29/18. She is doing well today without complaints. We are only doing PAP today.   Review of Systems  Constitutional: Negative for activity change and appetite change.  HENT: Negative.   Eyes: Negative for pain.  Respiratory: Negative for shortness of breath.   Cardiovascular: Negative for chest pain, palpitations and leg swelling.  Gastrointestinal: Negative for abdominal pain.  Endocrine: Negative for polydipsia.  Genitourinary: Negative.   Skin: Negative for rash.  Neurological: Negative for dizziness, weakness and headaches.  Hematological: Does not bruise/bleed easily.  Psychiatric/Behavioral: Negative.   All other systems reviewed and are negative.      Objective:   Physical Exam Constitutional:      General: She is not in acute distress.    Appearance: She is obese.  Cardiovascular:     Rate and Rhythm: Normal rate and regular rhythm.     Heart sounds: Normal heart sounds.  Pulmonary:     Effort: Pulmonary effort is normal.     Breath sounds: Normal breath sounds.  Abdominal:     General: Abdomen is flat. Bowel sounds are normal.  Genitourinary:    General: Normal vulva.     Rectum: Normal.     Comments: Vaginal cuff intact No adnexal masses or tenderness Skin:    General: Skin is warm and dry.  Neurological:     General: No focal deficit present.     Mental Status: She is alert and oriented to person, place, and time.  Psychiatric:        Mood and Affect: Mood normal.    BP 112/60   Pulse 62   Temp (!) 96.8 F (36 C) (Oral)   Ht 5\' 3"  (1.6 m)   Wt 169 lb (76.7 kg)   BMI 29.94 kg/m         Assessment & Plan:  Lisa Sutton in today with chief complaint of No chief complaint on file.   1. Gynecologic exam normal  - Pap IG  (Image Guided)  Keep follow up appointment with dr. Mayra Neer, FNP

## 2018-07-03 NOTE — Patient Instructions (Signed)
Breast Self-Awareness Breast self-awareness means:  Knowing how your breasts look.  Knowing how your breasts feel.  Checking your breasts every month for changes.  Telling your doctor if you notice a change in your breasts. Breast self-awareness allows you to notice a breast problem early while it is still small. How to do a breast self-exam One way to learn what is normal for your breasts and to check for changes is to do a breast self-exam. To do a breast self-exam: Look for Changes  1. Take off all the clothes above your waist. 2. Stand in front of a mirror in a room with good lighting. 3. Put your hands on your hips. 4. Push your hands down. 5. Look at your breasts and nipples in the mirror to see if one breast or nipple looks different than the other. Check to see if: ? The shape of one breast is different. ? The size of one breast is different. ? There are wrinkles, dips, and bumps in one breast and not the other. 6. Look at each breast for changes in your skin, such as: ? Redness. ? Scaly areas. 7. Look for changes in your nipples, such as: ? Liquid around the nipples. ? Bleeding. ? Dimpling. ? Redness. ? A change in where the nipples are. Feel for Changes 1. Lie on your back on the floor. 2. Feel each breast. To do this, follow these steps: ? Pick a breast to feel. ? Put the arm closest to that breast above your head. ? Use your other arm to feel the nipple area of your breast. Feel the area with the pads of your three middle fingers by making small circles with your fingers. For the first circle, press lightly. For the second circle, press harder. For the third circle, press even harder. ? Keep making circles with your fingers at the light, harder, and even harder pressures as you move down your breast. Stop when you feel your ribs. ? Move your fingers a little toward the center of your body. ? Start making circles with your fingers again, this time going up until you  reach your collarbone. ? Keep making up and down circles until you reach your armpit. Remember to keep using the three pressures. ? Feel the other breast in the same way. 3. Sit or stand in the shower or tub. 4. With soapy water on your skin, feel each breast the same way you did in step 2, when you were lying on the floor. Write Down What You Find After doing the self-exam, write down:  What is normal for each breast.  Any changes you find in each breast.  When you last had your period.  How often should I check my breasts? Check your breasts every month. If you are breastfeeding, the best time to check them is after you feed your baby or after you use a breast pump. If you get periods, the best time to check your breasts is 5-7 days after your period is over. When should I see my doctor? See your doctor if you notice:  A change in shape or size of your breasts or nipples.  A change in the skin of your breast or nipples, such as red or scaly skin.  Unusual fluid coming from your nipples.  A lump or thick area that was not there before.  Pain in your breasts.  Anything that concerns you. This information is not intended to replace advice given to you by your  of your breast or nipples, such as red or scaly skin.  · Unusual fluid coming from your nipples.  · A lump or thick area that was not there before.  · Pain in your breasts.  · Anything that concerns you.  This information is not intended to replace advice given to you by your health care provider. Make sure you discuss any questions you have with your health care provider.  Document Released: 10/27/2007 Document Revised: 10/16/2015 Document Reviewed: 03/30/2015  Elsevier Interactive Patient Education © 2019 Elsevier Inc[employer]

## 2018-07-04 DIAGNOSIS — Z1231 Encounter for screening mammogram for malignant neoplasm of breast: Secondary | ICD-10-CM | POA: Diagnosis not present

## 2018-07-04 LAB — HM MAMMOGRAPHY

## 2018-07-06 LAB — PAP IG (IMAGE GUIDED)

## 2018-07-19 ENCOUNTER — Encounter: Payer: Self-pay | Admitting: Pharmacist Clinician (PhC)/ Clinical Pharmacy Specialist

## 2018-07-19 ENCOUNTER — Ambulatory Visit (INDEPENDENT_AMBULATORY_CARE_PROVIDER_SITE_OTHER): Payer: Medicare Other | Admitting: Pharmacist Clinician (PhC)/ Clinical Pharmacy Specialist

## 2018-07-19 VITALS — BP 115/62 | HR 65

## 2018-07-19 DIAGNOSIS — E119 Type 2 diabetes mellitus without complications: Secondary | ICD-10-CM | POA: Diagnosis not present

## 2018-07-19 DIAGNOSIS — Z789 Other specified health status: Secondary | ICD-10-CM

## 2018-07-19 DIAGNOSIS — E781 Pure hyperglyceridemia: Secondary | ICD-10-CM | POA: Diagnosis not present

## 2018-07-19 MED ORDER — ROSUVASTATIN CALCIUM 5 MG PO TABS: 5.0000 mg | ORAL_TABLET | Freq: Every day | ORAL | 3 refills | Status: DC

## 2018-07-19 NOTE — Progress Notes (Signed)
Lipid Clinic Consultation  Chief Complaint:  No chief complaint on file.    Exam Regularity:  RRR Edema:  neg Respirations:  22   Carotid Bruits:  neg Xanthomas:  neg General Appearance:  alert, oriented, no acute distress Mood/Affect:  normal  HPI:  Long standing history of hyperlipidemia and type 2 diabetes.  Past myalgias with statins with no lab changes indicative of rhabdo or LFT elevations     Component Value Date/Time   CHOL 291 (H) 06/19/2018 0845   CHOL 147 04/16/2013 0820   TRIG 374 (H) 06/19/2018 0845   TRIG 368 (H) 04/21/2016 0812   TRIG 172 (H) 04/16/2013 0820   HDL 44 06/19/2018 0845   HDL 46 04/21/2016 0812   HDL 53 04/16/2013 0820   LDLDIRECT 99 11/01/2016 0956   CHOLHDL 6.6 (H) 06/19/2018 0845    Assessment: CHD/CHF Risk Equivalents:  DM [place] Estd 38yrs risk:  N/A NCEP Risk Factors Present:  HTN, family history, low HDL and age Primary Problem(s):  LDL or LDL-P elevated and TG elevated  Current NCEP Goals: LDL Goal < 100 HDL Goal >/= 45 Tg Goal < 150 Non-HDL Goal < 130  Secondary cause of hyperlipidemia present: Type 2 Diabetes Low fat diet followed?  No - has room to improve with cutting back on chips and mayo Low carb diet followed?  Yes - whole grain CHO and limited intake Exercise?  Yes - Silver Sneakers three time a week  Recommendations: Changes in lipid medication(s):  Add Crestor 5mg  1/2 tablet Mondays, Wednesdays, and Fridays.  Patient could not afford Vascepa.  Increase Vitamin D to 2000IU a day to help prevent myalgias by normalizing low vitamin D level.  Patient may add co-enzyme Q10 if she develops myalgias.  Vascepa can cause LDL elevations and she did take samples for 8 weeks.  LDL increased dramatically during that time.  Diabetes:  Patient is taking glucotrol only when home BG are elevated.  She had diarrhea with metformin but after talking with patient I discovered she was taking it on an empty stomach.  If her A1C increases,  will go back on metformin taking it correctly.  Recheck Lipid Panel:  6 weeks Other labs needed:  A1C and Vitamin D level  Time spent counseling patient:  45 min  Physician time spent with patient:  0 Referring Provider:  Laurance Flatten   PharmD:  Memory Argue, PharmD , CPP, CLS

## 2018-08-17 ENCOUNTER — Other Ambulatory Visit: Payer: Self-pay | Admitting: Family Medicine

## 2018-08-17 NOTE — Telephone Encounter (Signed)
[facility]   Pt is needing a refill on diphenoxylate-atropine (LOMOTIL) 2.5-0.025 MG tablet   states that pharmacy told her they have tried to send over a request since yesterday but has never heard from [place].

## 2018-08-18 NOTE — Telephone Encounter (Signed)
Phone protected by robo caller after dialing number phone gets disconnected

## 2018-08-21 NOTE — Telephone Encounter (Signed)
PT states that she uses this medication for when she has diarrhea and she is getting low and would like to have some just in case she needs it

## 2018-08-21 NOTE — Telephone Encounter (Signed)
This is a class V medication.  I will defer to PCP to refill.

## 2018-08-22 MED ORDER — DIPHENOXYLATE-ATROPINE 2.5-0.025 MG PO TABS: ORAL_TABLET | ORAL | 0 refills | Status: DC

## 2018-08-22 NOTE — Telephone Encounter (Signed)
Is okay to refill the Lomotil x1.

## 2018-08-22 NOTE — Addendum Note (Signed)
Addended by: Zannie Cove on: 08/22/2018 01:55 PM   Modules accepted: Orders

## 2018-08-22 NOTE — Telephone Encounter (Signed)
[facility] - please sign the #30 - no refills

## 2018-08-22 NOTE — Telephone Encounter (Signed)
A note was in the record that Dr. Laural Golden did not want her taking this after being discharged from the hospital.  Please give her one prescription to take as needed with no refills.

## 2018-09-13 ENCOUNTER — Telehealth (INDEPENDENT_AMBULATORY_CARE_PROVIDER_SITE_OTHER): Payer: Self-pay | Admitting: *Deleted

## 2018-09-13 NOTE — Telephone Encounter (Signed)
Patient called the office and states that she is having diarrhea, and is asking how should she take the Imodium.Per the OV not 02/27/18 Dr.Rehman advised the patient that she could take Imodium 1 a day or every other day.  She denies fever, weight loss, pain , N&V. She explains that she has to go 2-3 times per week. Stool will be loose to just little balls. This started in December after the patient  Had stopped the Metformin. She says at times it is urgent. She does say that she has started taking a lot of vitamins. She says that she will take the Imodium 1 a day or every other day to see how it works and will call the office in 1 week with progress report.  Patient was also ask to take Metamucil (sugar free) daily.

## 2018-09-21 ENCOUNTER — Encounter (INDEPENDENT_AMBULATORY_CARE_PROVIDER_SITE_OTHER): Payer: Self-pay

## 2018-09-21 ENCOUNTER — Telehealth (INDEPENDENT_AMBULATORY_CARE_PROVIDER_SITE_OTHER): Payer: Self-pay | Admitting: *Deleted

## 2018-09-21 NOTE — Telephone Encounter (Signed)
Patient sent a Patient Advice, she is still having issues. Discussed with Dr.Rehman, he wants to precede with [facility].

## 2018-09-22 ENCOUNTER — Other Ambulatory Visit (INDEPENDENT_AMBULATORY_CARE_PROVIDER_SITE_OTHER): Payer: Self-pay | Admitting: Internal Medicine

## 2018-09-25 ENCOUNTER — Telehealth (INDEPENDENT_AMBULATORY_CARE_PROVIDER_SITE_OTHER): Payer: Self-pay | Admitting: *Deleted

## 2018-09-25 ENCOUNTER — Other Ambulatory Visit (INDEPENDENT_AMBULATORY_CARE_PROVIDER_SITE_OTHER): Payer: Self-pay | Admitting: *Deleted

## 2018-09-25 ENCOUNTER — Encounter (INDEPENDENT_AMBULATORY_CARE_PROVIDER_SITE_OTHER): Payer: Self-pay

## 2018-09-25 DIAGNOSIS — R197 Diarrhea, unspecified: Secondary | ICD-10-CM

## 2018-09-25 MED ORDER — PEG 3350-KCL-NA BICARB-NACL 420 G PO SOLR: 4000.0000 mL | Freq: Once | ORAL | 0 refills | Status: AC

## 2018-09-25 NOTE — Telephone Encounter (Signed)
[facility] sch'd 10/11/18 at 830 ([phone]), sent MyChart message with instructions to patient

## 2018-09-25 NOTE — Telephone Encounter (Signed)
Patient needs trilyte 

## 2018-09-27 ENCOUNTER — Encounter: Payer: Self-pay | Admitting: Pharmacist Clinician (PhC)/ Clinical Pharmacy Specialist

## 2018-09-27 ENCOUNTER — Other Ambulatory Visit: Payer: Self-pay

## 2018-09-27 ENCOUNTER — Ambulatory Visit (INDEPENDENT_AMBULATORY_CARE_PROVIDER_SITE_OTHER): Payer: Medicare Other | Admitting: Pharmacist Clinician (PhC)/ Clinical Pharmacy Specialist

## 2018-09-27 DIAGNOSIS — E559 Vitamin D deficiency, unspecified: Secondary | ICD-10-CM

## 2018-09-27 DIAGNOSIS — E119 Type 2 diabetes mellitus without complications: Secondary | ICD-10-CM | POA: Diagnosis not present

## 2018-09-27 DIAGNOSIS — E1169 Type 2 diabetes mellitus with other specified complication: Secondary | ICD-10-CM

## 2018-09-27 DIAGNOSIS — E785 Hyperlipidemia, unspecified: Secondary | ICD-10-CM | POA: Diagnosis not present

## 2018-09-27 DIAGNOSIS — E1165 Type 2 diabetes mellitus with hyperglycemia: Secondary | ICD-10-CM | POA: Diagnosis not present

## 2018-09-27 DIAGNOSIS — E781 Pure hyperglyceridemia: Secondary | ICD-10-CM | POA: Diagnosis not present

## 2018-09-27 LAB — BAYER DCA HB A1C WAIVED: HB A1C (BAYER DCA - WAIVED): 6.3 % (ref <7.0)

## 2018-09-27 NOTE — Progress Notes (Signed)
Lisa Sutton is an 85 year old female who is referred by Dr. Laurance Flatten for type 2 diabetes and hyperlipidemia with past intolerance to statins.  Patient states she has not been able to go to her [place] work outs with Covid-19 so she has not been exercising as much and has been eating more than normal.  She is worried how this may impact her A1C and lipids.  She has tolerated Crestor 1/2 tab (2.5mg ) M, W, F with no myalgias or adverse effects.  Lipid panel is drawn today and an A1C.  A/P:  1.  Start walking outside daily 15-20 minutes 2.  Get back on track with diabetic diet 3.  A1C was 6.3% so continue glipizide 2.5mg  daily, metformin gave her diarrhea. 4.  Continue crestor until lipid results come back tomorrow.  Memory Argue, PharmD, CPP, CLS

## 2018-09-28 ENCOUNTER — Telehealth: Payer: Self-pay | Admitting: *Deleted

## 2018-09-28 LAB — LIPID PANEL
Chol/HDL Ratio: 5.8 ratio — ABNORMAL HIGH (ref 0.0–4.4)
Cholesterol, Total: 244 mg/dL — ABNORMAL HIGH (ref 100–199)
HDL: 42 mg/dL (ref >39)
LDL Calculated: 135 mg/dL — ABNORMAL HIGH (ref 0–99)
Triglycerides: 334 mg/dL — ABNORMAL HIGH (ref 0–149)
VLDL Cholesterol Cal: 67 mg/dL — ABNORMAL HIGH (ref 5–40)

## 2018-09-28 LAB — VITAMIN D 25 HYDROXY (VIT D DEFICIENCY, FRACTURES): Vit D, 25-Hydroxy: 42.1 ng/mL (ref 30.0–100.0)

## 2018-09-28 NOTE — Telephone Encounter (Signed)
Pt notified of results Verbalizes understanding Pt agreeable to try Crestor daily

## 2018-09-28 NOTE — Telephone Encounter (Signed)
-----   Message from [name], MD sent at 09/28/2018 10:45 AM EDT ----- If patient is tolerating Crestor at this low-dose 3 days a week, I would have her increase it to the same dose but daily.  Also with elevated triglycerides I think it would be appropriate to still try the Vascepa, but I would wait a four weeks to see if she tolerates the daily Crestor first.  She should call us back after she does the Crestor daily.  We can then add the Vascepa at that time.

## 2018-10-03 ENCOUNTER — Encounter (INDEPENDENT_AMBULATORY_CARE_PROVIDER_SITE_OTHER): Payer: Self-pay

## 2018-10-06 ENCOUNTER — Other Ambulatory Visit: Payer: Self-pay

## 2018-10-06 ENCOUNTER — Other Ambulatory Visit (HOSPITAL_COMMUNITY)
Admission: RE | Admit: 2018-10-06 | Discharge: 2018-10-06 | Disposition: A | Discharge status: Home / Self Care | Payer: Medicare Other | Source: Ambulatory Visit | Attending: Internal Medicine | Admitting: Internal Medicine

## 2018-10-06 DIAGNOSIS — Z1159 Encounter for screening for other viral diseases: Secondary | ICD-10-CM | POA: Diagnosis not present

## 2018-10-07 LAB — NOVEL CORONAVIRUS, NAA (HOSP ORDER, SEND-OUT TO REF LAB; TAT 18-24 HRS): SARS-CoV-2, NAA: NOT DETECTED

## 2018-10-11 ENCOUNTER — Encounter (HOSPITAL_COMMUNITY): Payer: Self-pay | Admitting: *Deleted

## 2018-10-11 ENCOUNTER — Ambulatory Visit (HOSPITAL_COMMUNITY)
Admission: RE | Admit: 2018-10-11 | Discharge: 2018-10-11 | Disposition: A | Discharge status: Home / Self Care | Payer: Medicare Other | Attending: Internal Medicine | Admitting: Internal Medicine

## 2018-10-11 ENCOUNTER — Encounter (HOSPITAL_COMMUNITY)
Admission: RE | Disposition: A | Discharge status: Home / Self Care | Payer: Self-pay | Source: Home / Self Care | Attending: Internal Medicine

## 2018-10-11 ENCOUNTER — Other Ambulatory Visit: Payer: Self-pay

## 2018-10-11 DIAGNOSIS — E119 Type 2 diabetes mellitus without complications: Secondary | ICD-10-CM | POA: Insufficient documentation

## 2018-10-11 DIAGNOSIS — M1990 Unspecified osteoarthritis, unspecified site: Secondary | ICD-10-CM | POA: Insufficient documentation

## 2018-10-11 DIAGNOSIS — Z9842 Cataract extraction status, left eye: Secondary | ICD-10-CM | POA: Insufficient documentation

## 2018-10-11 DIAGNOSIS — K449 Diaphragmatic hernia without obstruction or gangrene: Secondary | ICD-10-CM | POA: Insufficient documentation

## 2018-10-11 DIAGNOSIS — Z96652 Presence of left artificial knee joint: Secondary | ICD-10-CM | POA: Diagnosis not present

## 2018-10-11 DIAGNOSIS — E669 Obesity, unspecified: Secondary | ICD-10-CM | POA: Insufficient documentation

## 2018-10-11 DIAGNOSIS — Z79890 Hormone replacement therapy (postmenopausal): Secondary | ICD-10-CM | POA: Insufficient documentation

## 2018-10-11 DIAGNOSIS — Z79899 Other long term (current) drug therapy: Secondary | ICD-10-CM | POA: Insufficient documentation

## 2018-10-11 DIAGNOSIS — Z87442 Personal history of urinary calculi: Secondary | ICD-10-CM | POA: Diagnosis not present

## 2018-10-11 DIAGNOSIS — Z6828 Body mass index (BMI) 28.0-28.9, adult: Secondary | ICD-10-CM | POA: Insufficient documentation

## 2018-10-11 DIAGNOSIS — R197 Diarrhea, unspecified: Secondary | ICD-10-CM | POA: Insufficient documentation

## 2018-10-11 DIAGNOSIS — K5730 Diverticulosis of large intestine without perforation or abscess without bleeding: Secondary | ICD-10-CM

## 2018-10-11 DIAGNOSIS — K529 Noninfective gastroenteritis and colitis, unspecified: Secondary | ICD-10-CM

## 2018-10-11 DIAGNOSIS — Z8249 Family history of ischemic heart disease and other diseases of the circulatory system: Secondary | ICD-10-CM | POA: Diagnosis not present

## 2018-10-11 DIAGNOSIS — F419 Anxiety disorder, unspecified: Secondary | ICD-10-CM | POA: Diagnosis not present

## 2018-10-11 DIAGNOSIS — Z9841 Cataract extraction status, right eye: Secondary | ICD-10-CM | POA: Diagnosis not present

## 2018-10-11 DIAGNOSIS — K219 Gastro-esophageal reflux disease without esophagitis: Secondary | ICD-10-CM | POA: Diagnosis not present

## 2018-10-11 DIAGNOSIS — Z7984 Long term (current) use of oral hypoglycemic drugs: Secondary | ICD-10-CM | POA: Diagnosis not present

## 2018-10-11 DIAGNOSIS — Z5320 Procedure and treatment not carried out because of patient's decision for unspecified reasons: Secondary | ICD-10-CM

## 2018-10-11 DIAGNOSIS — I10 Essential (primary) hypertension: Secondary | ICD-10-CM | POA: Diagnosis not present

## 2018-10-11 DIAGNOSIS — E039 Hypothyroidism, unspecified: Secondary | ICD-10-CM | POA: Insufficient documentation

## 2018-10-11 DIAGNOSIS — K644 Residual hemorrhoidal skin tags: Secondary | ICD-10-CM

## 2018-10-11 DIAGNOSIS — Z888 Allergy status to other drugs, medicaments and biological substances status: Secondary | ICD-10-CM | POA: Insufficient documentation

## 2018-10-11 HISTORY — PX: BIOPSY: SHX5522

## 2018-10-11 HISTORY — PX: FLEXIBLE SIGMOIDOSCOPY: SHX5431

## 2018-10-11 LAB — GLUCOSE, CAPILLARY: Glucose-Capillary: 152 mg/dL — ABNORMAL HIGH (ref 70–99)

## 2018-10-11 SURGERY — SIGMOIDOSCOPY, FLEXIBLE
Anesthesia: Moderate Sedation

## 2018-10-11 MED ORDER — PSYLLIUM 58.6 % PO POWD: 1.0000 | Freq: Every day | ORAL | 12 refills | Status: DC

## 2018-10-11 MED ORDER — LOPERAMIDE HCL 2 MG PO CAPS: 2.0000 mg | ORAL_CAPSULE | Freq: Every day | ORAL | Status: DC

## 2018-10-11 MED ORDER — MIDAZOLAM HCL 5 MG/5ML IJ SOLN
INTRAMUSCULAR | Status: DC | PRN
  Administered 2018-10-11: 2 mg via INTRAVENOUS
  Administered 2018-10-11 (×3): 1 mg via INTRAVENOUS

## 2018-10-11 MED ORDER — MEPERIDINE HCL 50 MG/ML IJ SOLN
INTRAMUSCULAR | Status: DC | PRN
  Administered 2018-10-11: 25 mg

## 2018-10-11 MED ORDER — MIDAZOLAM HCL 5 MG/5ML IJ SOLN
INTRAMUSCULAR | Status: AC
  Filled 2018-10-11: qty 10

## 2018-10-11 MED ORDER — MEPERIDINE HCL 50 MG/ML IJ SOLN
INTRAMUSCULAR | Status: AC
  Filled 2018-10-11: qty 1

## 2018-10-11 MED ORDER — SODIUM CHLORIDE 0.9 % IV SOLN
INTRAVENOUS | Status: DC
  Administered 2018-10-11: 08:00:00 via INTRAVENOUS

## 2018-10-11 MED ORDER — STERILE WATER FOR IRRIGATION IR SOLN
Status: DC | PRN
  Administered 2018-10-11: 08:00:00

## 2018-10-11 NOTE — Discharge Instructions (Signed)
No aspirin or NSAIDs for 24 hours. Resume usual medications as before. Imodium OTC 2 mg by mouth daily with breakfast. Metamucil 3 to 4 g by mouth daily at bedtime. No driving for 24 hours. Physician will call with biopsy results.      Colonoscopy, Adult, Care After This sheet gives you information about how to care for yourself after your procedure. Your doctor may also give you more specific instructions. If you have problems or questions, call your doctor. What can I expect after the procedure? After the procedure, it is common to have:  A small amount of blood in your poop for 24 hours.  Some gas.  Mild cramping or bloating in your belly. Follow these instructions at home: General instructions  For the first 24 hours after the procedure: ? Do not drive or use machinery. ? Do not sign important documents. ? Do not drink alcohol. ? Do your daily activities more slowly than normal. ? Eat foods that are soft and easy to digest.  Take over-the-counter or prescription medicines only as told by your doctor. To help cramping and bloating:   Try walking around.  Put heat on your belly (abdomen) as told by your doctor. Use a heat source that your doctor recommends, such as a moist heat pack or a heating pad. ? Put a towel between your skin and the heat source. ? Leave the heat on for 20-30 minutes. ? Remove the heat if your skin turns bright red. This is especially important if you cannot feel pain, heat, or cold. You can get burned. Eating and drinking   Drink enough fluid to keep your pee (urine) clear or pale yellow.  Return to your normal diet as told by your doctor. Avoid heavy or fried foods that are hard to digest.  Avoid drinking alcohol for as long as told by your doctor. Contact a doctor if:  You have blood in your poop (stool) 2-3 days after the procedure. Get help right away if:  You have more than a small amount of blood in your poop.  You see large  clumps of tissue (blood clots) in your poop.  Your belly is swollen.  You feel sick to your stomach (nauseous).  You throw up (vomit).  You have a fever.  You have belly pain that gets worse, and medicine does not help your pain. Summary  After the procedure, it is common to have a small amount of blood in your poop. You may also have mild cramping and bloating in your belly.  For the first 24 hours after the procedure, do not drive or use machinery, do not sign important documents, and do not drink alcohol.  Get help right away if you have a lot of blood in your poop, feel sick to your stomach, have a fever, or have more belly pain. This information is not intended to replace advice given to you by your health care provider. Make sure you discuss any questions you have with your health care provider. Document Released: 06/12/2010 Document Revised: 03/10/2017 Document Reviewed: 02/02/2016 Elsevier Interactive Patient Education  2019 [employer].

## 2018-10-11 NOTE — H&P (Signed)
Lisa Sutton is an 85 y.o. female.   Chief Complaint: Patient is here for colonoscopy. HPI: Patient is 85 year old Caucasian female who presents with chronic diarrhea.  She was evaluated back in 2018.  Diarrhea improved and metformin dose was decreased.  She did have a colonoscopy with biopsy and biopsy was negative for microscopic colitis.  She says she has an average of 4 stools per day.  She denies abdominal pain melena or rectal bleeding.  She has a history of colonic adenomas but none found on last exam. Family history is negative for CRC.  Past Medical History:  Diagnosis Date  . Anxiety   . Arthritis   . Cataract   . Dysphagia    'sometimes but not a major issue' been checked out by GI (per pt)  . Dysrhythmia    'heart used to skip but doesn't anymore' was checked out by Dr. Ron Parker late '90s, everything checked out ok and not had any skipping since (all per pt)  . Essential hypertension, benign   . Fatty liver   . GERD (gastroesophageal reflux disease)   . Hepatitis B    had at age 60, 'GI doc said it's gone away'  . Hepatitis B surface antigen positive   . History of hiatal hernia   . Hypercalcemia   . Hypothyroidism   . Kidney stone August 2014   Patient was [facility]  . Obesity   . Osteoporosis   . PONV (postoperative nausea and vomiting)   . Type 2 diabetes mellitus ([place])     Past Surgical History:  Procedure Laterality Date  . ABDOMINAL HYSTERECTOMY    . BILATERAL CARPAL TUNNEL RELEASE     Dr. Marlou Sa at surgical center  . BIOPSY  05/26/2017   Procedure: BIOPSY;  Surgeon: Rogene Houston, MD;  [facility]: [facility];  Service: Endoscopy;;  colon  . CATARACT EXTRACTION, BILATERAL    . COLONOSCOPY N/A 05/26/2017   Procedure: COLONOSCOPY;  Surgeon: Rogene Houston, MD;  Location: [facility];  Service: Endoscopy;  Laterality: N/A;  10:30  . Deviated septum repair    . ESOPHAGOGASTRODUODENOSCOPY N/A 07/28/2016   Procedure: ESOPHAGOGASTRODUODENOSCOPY (EGD);   Surgeon: Rogene Houston, MD;  Location: [facility];  Service: Endoscopy;  Laterality: N/A;  300 - moved to 3/7 @ 1:00  . EYE SURGERY    . KNEE ARTHROPLASTY Left 09/15/2015   Procedure: COMPUTER ASSISTED TOTAL KNEE ARTHROPLASTY;  Surgeon: Marybelle Killings, MD;  Location: [place];  Service: Orthopedics;  Laterality: Left;  . KNEE ARTHROSCOPY     left knee 2003    Family History  Problem Relation Age of Onset  . Hypertension Father   . Hip fracture Father   . COPD Mother   . Cancer Maternal Aunt   . Diabetes Maternal Aunt   . Diabetes Maternal Uncle   . Colon cancer Neg Hx    Social History:  reports that she has never smoked. She has never used smokeless tobacco. She reports that she does not drink alcohol or use drugs.  Allergies:  Allergies  Allergen Reactions  . Statins Other (See Comments)    'discomfort, aching everywhere' Tolerates to livalo   . Zetia [Ezetimibe] Other (See Comments)    Leg pain  . Zocor [Simvastatin] Other (See Comments)    Leg pain    Medications Prior to Admission  Medication Sig Dispense Refill  . acetaminophen (TYLENOL) 500 MG tablet Take 1,000 mg by mouth daily as  needed for moderate pain or headache.    . Alpha-Lipoic Acid 600 MG TABS Take 600 mg by mouth 2 (two) times a day.    . ALPRAZolam (XANAX) 0.25 MG tablet Take 1 tablet (0.25 mg total) by mouth 2 (two) times daily as needed for anxiety. 180 tablet 1  . amLODipine (NORVASC) 5 MG tablet TAKE 1 TABLET BY MOUTH  DAILY 90 tablet 1  . atenolol (TENORMIN) 50 MG tablet Take 1 tablet (50 mg total) by mouth daily. 90 tablet 3  . b complex vitamins tablet Take 1 tablet by mouth daily.    . Cholecalciferol (VITAMIN D3) 1000 units CAPS Take 1,000 Units by mouth daily.     . DULoxetine (CYMBALTA) 30 MG capsule Take 1 capsule (30 mg total) by mouth daily. (Patient taking differently: Take 30 mg by mouth every other day. ) 90 capsule 3  . glipiZIDE (GLUCOTROL) 5 MG tablet Take 2.5 mg by mouth See admin  instructions. Only take if having a big meal at dinner    . irbesartan (AVAPRO) 150 MG tablet TAKE 1 TABLET BY MOUTH  DAILY 90 tablet 1  . levothyroxine (SYNTHROID, LEVOTHROID) 112 MCG tablet Take 1 tablet (112 mcg total) by mouth daily. (Patient taking differently: Take 112-137 mcg by mouth See admin instructions. 112 mcg Sun, Tues Thurs and Sat, 137 mcg Mon Wed and Fri (takes 112 and 25 mcg)) 90 tablet 3  . levothyroxine (SYNTHROID, LEVOTHROID) 25 MCG tablet TAKE 1 TABLET BY MOUTH  DAILY BEFORE BREAKFAST on MON, WED and FRI only (Patient taking differently: Take 25 mcg by mouth See admin instructions. TAKE BY MOUTH  DAILY BEFORE BREAKFAST  137 mcg Mon Wed and Fri (takes 112 and 25 mcg)) 36 tablet 3  . Multiple Vitamin (MULTIVITAMIN WITH MINERALS) TABS tablet Take 1 tablet by mouth daily.    . pantoprazole (PROTONIX) 40 MG tablet TAKE 1 TABLET BY MOUTH  DAILY BEFORE BREAKFAST (Patient taking differently: Take 40 mg by mouth every morning. ) 90 tablet 3  . rosuvastatin (CRESTOR) 5 MG tablet Take 1 tablet (5 mg total) by mouth daily. (Patient taking differently: Take 2.5 mg by mouth daily. ) 90 tablet 3  . ACCU-CHEK AVIVA PLUS test strip CHECK BLOOD SUGAR TWO TIMES DAILY 200 each 10    Results for orders placed or performed during the hospital encounter of 10/11/18 (from the past 48 hour(s))  Glucose, capillary     Status: Abnormal   Collection Time: 10/11/18  7:42 AM  Result Value Ref Range   Glucose-Capillary 152 (H) 70 - 99 mg/dL   No results found.  ROS  Blood pressure (!) 151/71, pulse (!) 55, temperature 97.9 F (36.6 C), temperature source Oral, resp. rate 14, height 5\' 3"  (1.6 m), weight 73 kg, SpO2 96 %. Physical Exam  Constitutional: She appears well-developed and well-nourished.  HENT:  Mouth/Throat: Oropharynx is clear and moist.  Eyes: Conjunctivae are normal. No scleral icterus.  Neck: No thyromegaly present.  Cardiovascular: Normal rate, regular rhythm and normal heart  sounds.  No murmur heard. Respiratory: Effort normal and breath sounds normal.  GI:  Abdomen is symmetrical.  She has Pfannenstiel scar.  Abdomen is soft and nontender with organomegaly or masses.  Musculoskeletal:        General: No edema.  Lymphadenopathy:    She has no cervical adenopathy.  Neurological: She is alert.  Skin: Skin is warm and dry.     Assessment/Plan Chronic diarrhea. Diagnostic colonoscopy.  [employer],  MD 10/11/2018, 8:20 AM

## 2018-10-11 NOTE — Op Note (Signed)
[facility] Patient Name: Lisa Sutton Procedure Date: 10/11/2018 8:11 AM MRN: [id number] Date of Birth: 06-20-1933 Attending MD: [name] , MD CSN: [id number] Age: 85 Admit Type: Outpatient Procedure:                Colonoscopy Indications:              Chronic diarrhea Providers:                [name], MD, Janeece Riggers, RN, Raphael Gibney,                            Technician, Randa Spike, Technician Referring MD:             [name] Medicines:                Meperidine 25 mg IV, Midazolam 5 mg IV Complications:            No immediate complications. Estimated Blood Loss:     Estimated blood loss was minimal. Procedure:                Pre-Anesthesia Assessment:                           - Prior to the procedure, a History and Physical                            was performed, and patient medications and                            allergies were reviewed. The patient's tolerance of                            previous anesthesia was also reviewed. The risks                            and benefits of the procedure and the sedation                            options and risks were discussed with the patient.                            All questions were answered, and informed consent                            was obtained. Prior Anticoagulants: The patient has                            taken no previous anticoagulant or antiplatelet                            agents. ASA Grade Assessment: II - A patient with                            mild systemic disease. After reviewing the risks  and benefits, the patient was deemed in                            satisfactory condition to undergo the procedure.                           After obtaining informed consent, the colonoscope                            was passed under direct vision. Throughout the                            procedure, the patient's blood pressure, pulse, and                       oxygen saturations were monitored continuously. The                            [id number]-[id number] ([id number]) scope was introduced through                            the anus and advanced to the the sigmoid colon. The                            colonoscopy was technically difficult and complex                            due to restricted mobility of the colon. Successful                            completion of the procedure was aided by changing                            the patient's position, using manual pressure and                            withdrawing and reinserting the scope. The patient                            tolerated the procedure well. The quality of the                            bowel preparation was adequate. The rectumsigmoid                            colon were photographed. Scope In: 8:31:43 AM Scope Out: 9:07:30 AM Total Procedure Duration: 0 hours 35 minutes 47 seconds  Findings:      The perianal and digital rectal examinations were normal.      Scattered diverticula were found in the sigmoid colon.      The rectum and sigmoid colon appeared normal. Biopsies were taken with a       cold forceps for histology. The pathology specimen was placed into       Bottle Number 1.      External  hemorrhoids were found during retroflexion. The hemorrhoids       were small. Impression:               - Incomplete exam secondary to tortuous sigmoid                            colon with diverticula.                           - Diverticulosis in the sigmoid colon.                           - The rectum and sigmoid colon are normal. Biopsied.                           - External hemorrhoids. Moderate Sedation:      Moderate (conscious) sedation was administered by the endoscopy nurse       and supervised by the endoscopist. The following parameters were       monitored: oxygen saturation, heart rate, blood pressure, CO2       capnography and response to care. Total  physician intraservice time was       39 minutes. Recommendation:           - Patient has a contact number available for                            emergencies. The signs and symptoms of potential                            delayed complications were discussed with the                            patient. Return to normal activities tomorrow.                            Written discharge instructions were provided to the                            patient.                           - Resume previous diet today.                           - Continue present medications.                           - No aspirin, ibuprofen, naproxen, or other                            non-steroidal anti-inflammatory drugs for 1 day.                           - Metamucil 3 to 4 g by mouth daily.                           -  Imodium OTC 2mg  by mouth every morning.                           - Await pathology results.                           - No repeat colonoscopy due to age and the absence                            of advanced adenomas. Procedure Code(s):        --- Professional ---                           [phone], 68, Colonoscopy, flexible; with biopsy,                            single or multiple                           99153, Moderate sedation; each additional 15                            minutes intraservice time                           99153, Moderate sedation; each additional 15                            minutes intraservice time                           G0500, Moderate sedation services provided by the                            same physician or other qualified health care                            professional performing a gastrointestinal                            endoscopic service that sedation supports,                            requiring the presence of an independent trained                            observer to assist in the monitoring of the                            patient's  level of consciousness and physiological                            status; initial 15 minutes of intra-service time;                            patient age 103 years or  older (additional time may                            be reported with [phone], as appropriate) Diagnosis Code(s):        --- Professional ---                           K64.4, Residual hemorrhoidal skin tags                           K52.9, Noninfective gastroenteritis and colitis,                            unspecified                           K57.30, Diverticulosis of large intestine without                            perforation or abscess without bleeding CPT copyright 2019 American Medical Association. All rights reserved. The codes documented in this report are preliminary and upon coder review may  be revised to meet current compliance requirements. [name], MD [name], MD 10/11/2018 9:20:36 AM This report has been signed electronically. Number of Addenda: 0

## 2018-10-12 ENCOUNTER — Encounter (HOSPITAL_COMMUNITY): Payer: Self-pay | Admitting: Internal Medicine

## 2018-10-17 ENCOUNTER — Encounter: Payer: Medicare Other | Admitting: *Deleted

## 2018-10-24 ENCOUNTER — Encounter: Payer: Self-pay | Admitting: *Deleted

## 2018-10-24 ENCOUNTER — Telehealth: Payer: Self-pay | Admitting: Family Medicine

## 2018-10-26 DIAGNOSIS — Z961 Presence of intraocular lens: Secondary | ICD-10-CM | POA: Diagnosis not present

## 2018-10-26 DIAGNOSIS — H04123 Dry eye syndrome of bilateral lacrimal glands: Secondary | ICD-10-CM | POA: Diagnosis not present

## 2018-10-26 DIAGNOSIS — E119 Type 2 diabetes mellitus without complications: Secondary | ICD-10-CM | POA: Diagnosis not present

## 2018-10-26 DIAGNOSIS — H353131 Nonexudative age-related macular degeneration, bilateral, early dry stage: Secondary | ICD-10-CM | POA: Diagnosis not present

## 2018-10-26 LAB — HM DIABETES EYE EXAM

## 2018-11-02 ENCOUNTER — Other Ambulatory Visit: Payer: Self-pay | Admitting: Family Medicine

## 2018-11-09 ENCOUNTER — Other Ambulatory Visit: Payer: Self-pay | Admitting: *Deleted

## 2018-11-09 ENCOUNTER — Other Ambulatory Visit: Payer: Medicare Other

## 2018-11-09 ENCOUNTER — Other Ambulatory Visit: Payer: Self-pay

## 2018-11-09 DIAGNOSIS — E785 Hyperlipidemia, unspecified: Secondary | ICD-10-CM | POA: Diagnosis not present

## 2018-11-09 DIAGNOSIS — E1169 Type 2 diabetes mellitus with other specified complication: Secondary | ICD-10-CM

## 2018-11-09 DIAGNOSIS — E781 Pure hyperglyceridemia: Secondary | ICD-10-CM

## 2018-11-09 DIAGNOSIS — R739 Hyperglycemia, unspecified: Secondary | ICD-10-CM | POA: Diagnosis not present

## 2018-11-09 DIAGNOSIS — I10 Essential (primary) hypertension: Secondary | ICD-10-CM | POA: Diagnosis not present

## 2018-11-09 DIAGNOSIS — E559 Vitamin D deficiency, unspecified: Secondary | ICD-10-CM | POA: Diagnosis not present

## 2018-11-09 DIAGNOSIS — E1165 Type 2 diabetes mellitus with hyperglycemia: Secondary | ICD-10-CM

## 2018-11-09 DIAGNOSIS — E039 Hypothyroidism, unspecified: Secondary | ICD-10-CM

## 2018-11-09 DIAGNOSIS — Z7689 Persons encountering health services in other specified circumstances: Secondary | ICD-10-CM | POA: Diagnosis not present

## 2018-11-09 LAB — LIPID PANEL

## 2018-11-10 ENCOUNTER — Telehealth: Payer: Self-pay | Admitting: Family Medicine

## 2018-11-10 LAB — CBC WITH DIFFERENTIAL/PLATELET
Basophils Absolute: 0.1 10*3/uL (ref 0.0–0.2)
Basos: 1 %
EOS (ABSOLUTE): 0.3 10*3/uL (ref 0.0–0.4)
Eos: 3 %
Hematocrit: 42.2 % (ref 34.0–46.6)
Hemoglobin: 14.3 g/dL (ref 11.1–15.9)
Immature Grans (Abs): 0.1 10*3/uL (ref 0.0–0.1)
Immature Granulocytes: 1 %
Lymphocytes Absolute: 2 10*3/uL (ref 0.7–3.1)
Lymphs: 26 %
MCH: 31 pg (ref 26.6–33.0)
MCHC: 33.9 g/dL (ref 31.5–35.7)
MCV: 91 fL (ref 79–97)
Monocytes Absolute: 0.8 10*3/uL (ref 0.1–0.9)
Monocytes: 11 %
Neutrophils Absolute: 4.4 10*3/uL (ref 1.4–7.0)
Neutrophils: 58 %
Platelets: 204 10*3/uL (ref 150–450)
RBC: 4.62 x10E6/uL (ref 3.77–5.28)
RDW: 12.4 % (ref 11.7–15.4)
WBC: 7.7 10*3/uL (ref 3.4–10.8)

## 2018-11-10 LAB — BMP8+EGFR
BUN/Creatinine Ratio: 17 (ref 12–28)
BUN: 26 mg/dL (ref 8–27)
CO2: 22 mmol/L (ref 20–29)
Calcium: 8.9 mg/dL (ref 8.7–10.3)
Chloride: 103 mmol/L (ref 96–106)
Creatinine, Ser: 1.52 mg/dL — ABNORMAL HIGH (ref 0.57–1.00)
GFR calc Af Amer: 37 mL/min/{1.73_m2} — ABNORMAL LOW (ref >59)
GFR calc non Af Amer: 32 mL/min/{1.73_m2} — ABNORMAL LOW (ref >59)
Glucose: 163 mg/dL — ABNORMAL HIGH (ref 65–99)
Potassium: 5.1 mmol/L (ref 3.5–5.2)
Sodium: 140 mmol/L (ref 134–144)

## 2018-11-10 LAB — HEPATIC FUNCTION PANEL
ALT: 23 IU/L (ref 0–32)
AST: 25 IU/L (ref 0–40)
Albumin: 3.9 g/dL (ref 3.6–4.6)
Alkaline Phosphatase: 135 IU/L — ABNORMAL HIGH (ref 39–117)
Bilirubin Total: 0.2 mg/dL (ref 0.0–1.2)
Bilirubin, Direct: 0.08 mg/dL (ref 0.00–0.40)
Total Protein: 6.7 g/dL (ref 6.0–8.5)

## 2018-11-10 LAB — THYROID PANEL WITH TSH
Free Thyroxine Index: 2.6 (ref 1.2–4.9)
T3 Uptake Ratio: 27 % (ref 24–39)
T4, Total: 9.5 ug/dL (ref 4.5–12.0)
TSH: 4.34 u[IU]/mL (ref 0.450–4.500)

## 2018-11-10 LAB — LIPID PANEL
Chol/HDL Ratio: 4.3 ratio (ref 0.0–4.4)
Cholesterol, Total: 224 mg/dL — ABNORMAL HIGH (ref 100–199)
HDL: 52 mg/dL (ref >39)
LDL Calculated: 122 mg/dL — ABNORMAL HIGH (ref 0–99)
Triglycerides: 248 mg/dL — ABNORMAL HIGH (ref 0–149)
VLDL Cholesterol Cal: 50 mg/dL — ABNORMAL HIGH (ref 5–40)

## 2018-11-10 LAB — VITAMIN D 25 HYDROXY (VIT D DEFICIENCY, FRACTURES): Vit D, 25-Hydroxy: 47 ng/mL (ref 30.0–100.0)

## 2018-11-10 NOTE — Telephone Encounter (Signed)
Pt states she saw her results online and that she will discuss results with him during her visit next week and I will add A1C to the order.

## 2018-11-13 ENCOUNTER — Other Ambulatory Visit (INDEPENDENT_AMBULATORY_CARE_PROVIDER_SITE_OTHER): Payer: Self-pay | Admitting: *Deleted

## 2018-11-13 DIAGNOSIS — R748 Abnormal levels of other serum enzymes: Secondary | ICD-10-CM

## 2018-11-15 LAB — HGB A1C W/O EAG: Hgb A1c MFr Bld: 6.3 % — ABNORMAL HIGH (ref 4.8–5.6)

## 2018-11-15 LAB — SPECIMEN STATUS REPORT

## 2018-11-16 ENCOUNTER — Encounter: Payer: Self-pay | Admitting: Family Medicine

## 2018-11-16 ENCOUNTER — Ambulatory Visit (INDEPENDENT_AMBULATORY_CARE_PROVIDER_SITE_OTHER): Payer: Medicare Other | Admitting: Family Medicine

## 2018-11-16 ENCOUNTER — Other Ambulatory Visit: Payer: Self-pay

## 2018-11-16 DIAGNOSIS — E559 Vitamin D deficiency, unspecified: Secondary | ICD-10-CM

## 2018-11-16 DIAGNOSIS — E1121 Type 2 diabetes mellitus with diabetic nephropathy: Secondary | ICD-10-CM

## 2018-11-16 DIAGNOSIS — E034 Atrophy of thyroid (acquired): Secondary | ICD-10-CM

## 2018-11-16 DIAGNOSIS — K760 Fatty (change of) liver, not elsewhere classified: Secondary | ICD-10-CM | POA: Diagnosis not present

## 2018-11-16 DIAGNOSIS — K219 Gastro-esophageal reflux disease without esophagitis: Secondary | ICD-10-CM | POA: Diagnosis not present

## 2018-11-16 DIAGNOSIS — I10 Essential (primary) hypertension: Secondary | ICD-10-CM | POA: Diagnosis not present

## 2018-11-16 DIAGNOSIS — N183 Chronic kidney disease, stage 3 (moderate): Secondary | ICD-10-CM

## 2018-11-16 DIAGNOSIS — E782 Mixed hyperlipidemia: Secondary | ICD-10-CM

## 2018-11-16 DIAGNOSIS — Z86010 Personal history of colonic polyps: Secondary | ICD-10-CM

## 2018-11-16 DIAGNOSIS — N1832 Chronic kidney disease, stage 3b: Secondary | ICD-10-CM

## 2018-11-16 MED ORDER — IRBESARTAN 150 MG PO TABS: 150.0000 mg | ORAL_TABLET | Freq: Every day | ORAL | 3 refills | Status: DC

## 2018-11-16 NOTE — Addendum Note (Signed)
Addended by: Zannie Cove on: 11/16/2018 10:01 AM   Modules accepted: Orders

## 2018-11-16 NOTE — Patient Instructions (Addendum)
Continue regular follow-up with gastroenterology and endocrinology and nephrology as needed. Follow-up with ophthalmology as planned and get regular visits along with getting mammogram regularly and pelvic exam regularly Continue to practice good hand and respiratory hygiene Wear personal protective equipment when in doctor's offices or out in the public Drink plenty of fluids and stay well-hydrated Return to see PCP in 4 months Repeat liver function test in 4 weeks

## 2018-11-16 NOTE — Progress Notes (Signed)
Virtual Visit Via telephone Note I connected with@ on 11/16/18 by telephone and verified that I am speaking with the correct person or authorized healthcare agent using two identifiers. [place] is currently located at home and there are no unauthorized people in close proximity. I completed this visit while in a private location in my home .  This visit type was conducted due to national recommendations for restrictions regarding the COVID-19 Pandemic (e.g. social distancing).  This format is felt to be most appropriate for this patient at this time.  All issues noted in this document were discussed and addressed.  No physical exam was performed.    I discussed the limitations, risks, security and privacy concerns of performing an evaluation and management service by telephone and the availability of in person appointments. I also discussed with the patient that there may be a patient responsible charge related to this service. The patient expressed understanding and agreed to proceed.   Date:  11/16/2018    ID:  Lisa Sutton      02-29-1934        [id number]   Patient Care Team Patient Care Team: [name], MD as PCP - General (Family Medicine) Rogene Houston, MD as Consulting Physician (Gastroenterology) Marybelle Killings, MD as Consulting Physician (Orthopedic Surgery) Fran Lowes, MD as Consulting Physician (Nephrology) Lavonna Monarch, MD as Consulting Physician (Dermatology) Clent Jacks, MD as Consulting Physician (Ophthalmology) Philemon Kingdom, MD as Consulting Physician (Internal Medicine)  Reason for Visit: Primary Care Follow-up     History of Present Illness & Review of Systems:     Lisa Sutton is a 85 y.o. year old female primary care patient that presents today for a telehealth visit.  The patient is doing well overall.  She had a recent colonoscopy that was negative and Dr. Melony Overly just suggested that she take Imodium as needed for  loose bowel movements which she has not had to take recently because her bowel movements have been normal.  She denies any chest pain pressure tightness or shortness of breath.  She is currently having no issues with her stomach at all but will take Imodium if needed for loose bowel movements.  She denies any blood in the stool.  She is passing her water well.  She has had her mammogram and pelvic exams this year and is up-to-date.  She had an eye exam in May.  Review of systems as stated, otherwise negative.  The patient does not have symptoms concerning for COVID-19 infection (fever, chills, cough, or new shortness of breath).      Current Medications (Verified) Allergies as of 11/16/2018      Reactions   Statins Other (See Comments)   'discomfort, aching everywhere' Tolerates to [place] [ezetimibe] Other (See Comments)   Leg pain   Zocor [simvastatin] Other (See Comments)   Leg pain      Medication List       Accurate as of November 16, 2018  8:42 AM. If you have any questions, ask your nurse or doctor.        Accu-Chek Aviva Plus test strip Generic drug: glucose blood CHECK BLOOD SUGAR TWO TIMES DAILY   acetaminophen 500 MG tablet Commonly known as: TYLENOL Take 1,000 mg by mouth daily as needed for moderate pain or headache.   Alpha-Lipoic Acid 600 MG Tabs Take 600 mg by mouth 2 (two) times a day.   ALPRAZolam 0.25  MG tablet Commonly known as: XANAX Take 1 tablet (0.25 mg total) by mouth 2 (two) times daily as needed for anxiety.   amLODipine 5 MG tablet Commonly known as: NORVASC TAKE 1 TABLET BY MOUTH  DAILY   atenolol 50 MG tablet Commonly known as: TENORMIN Take 1 tablet (50 mg total) by mouth daily.   b complex vitamins tablet Take 1 tablet by mouth daily.   DULoxetine 30 MG capsule Commonly known as: CYMBALTA Take 1 capsule (30 mg total) by mouth daily. What changed: when to take this   glipiZIDE 5 MG tablet Commonly known as: GLUCOTROL Take 2.5  mg by mouth See admin instructions. Only take if having a big meal at dinner   irbesartan 150 MG tablet Commonly known as: AVAPRO TAKE 1 TABLET BY MOUTH  DAILY   levothyroxine 112 MCG tablet Commonly known as: SYNTHROID Take 1 tablet (112 mcg total) by mouth daily. What changed:   how much to take  when to take this  additional instructions   levothyroxine 25 MCG tablet Commonly known as: SYNTHROID TAKE 1 TABLET BY MOUTH  DAILY BEFORE BREAKFAST on MON, WED and FRI only What changed:   how much to take  how to take this  when to take this  additional instructions   loperamide 2 MG capsule Commonly known as: IMODIUM Take 1 capsule (2 mg total) by mouth daily after breakfast.   multivitamin with minerals Tabs tablet Take 1 tablet by mouth daily.   pantoprazole 40 MG tablet Commonly known as: PROTONIX TAKE 1 TABLET BY MOUTH  DAILY BEFORE BREAKFAST What changed: See the new instructions.   psyllium 58.6 % powder Commonly known as: Metamucil Smooth Texture Take 1 packet by mouth daily.   rosuvastatin 5 MG tablet Commonly known as: Crestor Take 1 tablet (5 mg total) by mouth daily. What changed: how much to take   Vitamin D3 25 MCG (1000 UT) Caps Take 1,000 Units by mouth daily.           Allergies (Verified)    Statins, Zetia [ezetimibe], and Zocor [simvastatin]  Past Medical History Past Medical History:  Diagnosis Date  . Anxiety   . Arthritis   . Cataract   . Dysphagia    'sometimes but not a major issue' been checked out by GI (per pt)  . Dysrhythmia    'heart used to skip but doesn't anymore' was checked out by Dr. Ron Parker late '90s, everything checked out ok and not had any skipping since (all per pt)  . Essential hypertension, benign   . Fatty liver   . GERD (gastroesophageal reflux disease)   . Hepatitis B    had at age 9, 'GI doc said it's gone away'  . Hepatitis B surface antigen positive   . History of hiatal hernia   . Hypercalcemia    . Hypothyroidism   . Kidney stone August 2014   Patient was [facility] at [facility]  . Obesity   . Osteoporosis   . PONV (postoperative nausea and vomiting)   . Type 2 diabetes mellitus ([place])      Past Surgical History:  Procedure Laterality Date  . ABDOMINAL HYSTERECTOMY    . BILATERAL CARPAL TUNNEL RELEASE     Dr. Marlou Sa at surgical center  . BIOPSY  05/26/2017   Procedure: BIOPSY;  Surgeon: Rogene Houston, MD;  [facility];  Service: Endoscopy;;  colon  . BIOPSY  10/11/2018   Procedure: BIOPSY;  Surgeon: [name]  U, MD;  [facility];  Service: Endoscopy;;  . CATARACT EXTRACTION, BILATERAL    . COLONOSCOPY N/A 05/26/2017   Procedure: COLONOSCOPY;  Surgeon: Rogene Houston, MD;  [facility];  Service: Endoscopy;  Laterality: N/A;  10:30  . Deviated septum repair    . ESOPHAGOGASTRODUODENOSCOPY N/A 07/28/2016   Procedure: ESOPHAGOGASTRODUODENOSCOPY (EGD);  Surgeon: Rogene Houston, MD;  [facility];  Service: Endoscopy;  Laterality: N/A;  300 - moved to 3/7 @ 1:00  . EYE SURGERY    . FLEXIBLE SIGMOIDOSCOPY N/A 10/11/2018   Procedure: FLEXIBLE SIGMOIDOSCOPY;  Surgeon: Rogene Houston, MD;  [facility];  Service: Endoscopy;  Laterality: N/A;  unable to complete colonoscopy   . KNEE ARTHROPLASTY Left 09/15/2015   Procedure: COMPUTER ASSISTED TOTAL KNEE ARTHROPLASTY;  Surgeon: Marybelle Killings, MD;  Location: [facility];  Service: Orthopedics;  Laterality: Left;  . KNEE ARTHROSCOPY     left knee 2003    Social History   Socioeconomic History  . Marital status: Widowed    Spouse name: Not on file  . Number of children: Not on file  . Years of education: Not on file  . Highest education level: Not on file  Occupational History  . Not on file  Social Needs  . Financial resource strain: Not on file  . Food insecurity    Worry: Not on file    Inability: Not on file  . Transportation needs    Medical: Not on file    Non-medical: Not  on file  Tobacco Use  . Smoking status: Never Smoker  . Smokeless tobacco: Never Used  Substance and Sexual Activity  . Alcohol use: No    Alcohol/week: 0.0 standard drinks  . Drug use: No  . Sexual activity: Never  Lifestyle  . Physical activity    Days per week: Not on file    Minutes per session: Not on file  . Stress: Not on file  Relationships  . Social Herbalist on phone: Not on file    Gets together: Not on file    Attends religious service: Not on file    Active member of club or organization: Not on file    Attends meetings of clubs or organizations: Not on file    Relationship status: Not on file  Other Topics Concern  . Not on file  Social History Narrative  . Not on file     Family History  Problem Relation Age of Onset  . Hypertension Father   . Hip fracture Father   . COPD Mother   . Cancer Maternal Aunt   . Diabetes Maternal Aunt   . Diabetes Maternal Uncle   . Colon cancer Neg Hx       Labs/Other Tests and Data Reviewed:    Wt Readings from Last 3 Encounters:  10/11/18 161 lb (73 kg)  07/03/18 169 lb (76.7 kg)  06/29/18 167 lb (75.8 kg)   Temp Readings from Last 3 Encounters:  10/11/18 97.6 F (36.4 C) (Oral)  07/03/18 (!) 96.8 F (36 C) (Oral)  06/29/18 (!) 97.4 F (36.3 C) (Oral)   BP Readings from Last 3 Encounters:  10/11/18 (!) 141/82  07/19/18 115/62  07/03/18 112/60   Pulse Readings from Last 3 Encounters:  10/11/18 (!) 56  07/19/18 65  07/03/18 62     Lab Results  Component Value Date   HGBA1C 6.3 (H) 11/09/2018   HGBA1C  6.3 09/27/2018   HGBA1C 6.3 06/19/2018   Lab Results  Component Value Date   MICROALBUR NEGATIVE 05/31/2014   LDLCALC 122 (H) 11/09/2018   CREATININE 1.52 (H) 11/09/2018       Chemistry      Component Value Date/Time   NA 140 11/09/2018 1111   K 5.1 11/09/2018 1111   CL 103 11/09/2018 1111   CO2 22 11/09/2018 1111   BUN 26 11/09/2018 1111   CREATININE 1.52 (H) 11/09/2018 1111       Component Value Date/Time   CALCIUM 8.9 11/09/2018 1111   ALKPHOS 135 (H) 11/09/2018 1111   AST 25 11/09/2018 1111   ALT 23 11/09/2018 1111   BILITOT 0.2 11/09/2018 1111         OBSERVATIONS/ OBJECTIVE:     The patient's weight is 164 and this is stable for her.  Fasting blood sugar was 163.  Her blood pressures have been ranging systolic from 1 82-9 36 over the upper 60s and 70s.  Her feet are fine with no sign of infection redness or swelling.  Physical exam deferred due to nature of telephonic visit.  ASSESSMENT & PLAN    Time:   Today, I have spent 28 minutes with the patient via telephone discussing the above including Covid precautions.     Visit Diagnoses: 1. Essential hypertension, benign -Continue monitoring blood pressures at home and watch sodium intake and we will refill the irbesartan  2. Fatty liver -Continue to exercise regularly and watch diet closely and repeat liver function test in about 4 weeks  3. Gastroesophageal reflux disease, esophagitis presence not specified -Continue with Protonix and watch diet closely  4. Hypothyroidism due to acquired atrophy of thyroid -Continue with current dose of thyroid medication  5. Type 2 diabetes mellitus with diabetic nephropathy, without long-term current use of insulin ([place]) -Continue follow-up with endocrinology and continue with glipizide as currently doing and watching diet closely  6. Chronic kidney disease (CKD) stage G3b/A1, moderately decreased glomerular filtration rate (GFR) between 30-44 mL/min/1.73 square meter and albuminuria creatinine ratio less than 30 mg/g ([facility]) -Creatinine was slightly higher this time and patient was reminded to avoid all NSAIDs like ibuprofen and Aleve and make all efforts to keep blood pressure under the best control possible  7. Hypercalcemia -Most recent calcium level was good  8. Mixed hyperlipidemia -Triglycerides remain elevated but lower with taking 2-1/2 mg of  Crestor daily which she is tolerating well.  She will gradually increase this and will take 5 mg on Monday Wednesday and Friday and 2-1/2 mg all other days over the next 4 to 6 weeks and if she tolerates the increased dose that she will go to 5 mg daily after 6 weeks.  She will need a repeat lipid liver panel at her next visit but will get liver function test sooner because of the liver function test that was elevated on the recent blood work.  9. Vitamin D deficiency -Continue with current vitamin D replacement  10. History of colonic polyps -Recent colonoscopy was negative for polyps and she will continue to follow-up with Dr. remind as needed  42. Vitamin D deficiency, unspecified -Continue with vitamin D replacement  Patient Instructions  Continue regular follow-up with gastroenterology and endocrinology and nephrology as needed. Follow-up with ophthalmology as planned and get regular visits along with getting mammogram regularly and pelvic exam regularly Continue to practice good hand and respiratory hygiene Wear personal protective equipment when in doctor's offices or out  in the public Drink plenty of fluids and stay well-hydrated Return to see PCP in 4 months Repeat liver function test in 4 weeks     The above assessment and management plan was discussed with the patient. The patient verbalized understanding of and has agreed to the management plan. Patient is aware to call the clinic if symptoms persist or worsen. Patient is aware when to return to the clinic for a follow-up visit. Patient educated on when it is appropriate to go to the emergency department.    [name], MD [address], [place], [place] [address] Ph [phone]   Arrie Senate MD

## 2018-12-08 ENCOUNTER — Telehealth: Payer: Self-pay | Admitting: Family Medicine

## 2018-12-08 ENCOUNTER — Telehealth: Payer: Self-pay | Admitting: Internal Medicine

## 2018-12-08 MED ORDER — FLUCONAZOLE 150 MG PO TABS: 150.0000 mg | ORAL_TABLET | Freq: Once | ORAL | 0 refills | Status: AC

## 2018-12-08 NOTE — Telephone Encounter (Signed)
Aware.  Will need to schedule appointment with new provider for refills.

## 2018-12-08 NOTE — Telephone Encounter (Signed)
MEDICATION: Diflucan  PHARMACY:  [facility] :   IS PATIENT OUT OF MEDICATION:   IF NOT; HOW MUCH IS LEFT:   LAST APPOINTMENT DATE: @1 /30/2020  NEXT APPOINTMENT DATE:@7 /23/2020  DO WE HAVE YOUR PERMISSION TO LEAVE A DETAILED MESSAGE:  OTHER COMMENTS: Patient would have called her primary but he retired last week and she is in the process of being assigned and new PCP.  She is having vaginal itching at this time   **Let patient know to contact pharmacy at the end of the day to make sure medication is ready. **  ** Please notify patient to allow 48-72 hours to process**  **Encourage patient to contact the pharmacy for refills or they can request refills through [facility]**

## 2018-12-08 NOTE — Telephone Encounter (Signed)
RX sent

## 2018-12-12 ENCOUNTER — Other Ambulatory Visit: Payer: Self-pay

## 2018-12-14 ENCOUNTER — Ambulatory Visit: Payer: Medicare Other | Admitting: Internal Medicine

## 2018-12-14 ENCOUNTER — Other Ambulatory Visit: Payer: Self-pay

## 2018-12-14 ENCOUNTER — Encounter: Payer: Self-pay | Admitting: Internal Medicine

## 2018-12-14 VITALS — BP 118/72 | HR 82 | Ht 63.0 in | Wt 166.0 lb

## 2018-12-14 DIAGNOSIS — E034 Atrophy of thyroid (acquired): Secondary | ICD-10-CM | POA: Diagnosis not present

## 2018-12-14 DIAGNOSIS — E782 Mixed hyperlipidemia: Secondary | ICD-10-CM | POA: Diagnosis not present

## 2018-12-14 DIAGNOSIS — E1121 Type 2 diabetes mellitus with diabetic nephropathy: Secondary | ICD-10-CM

## 2018-12-14 DIAGNOSIS — B373 Candidiasis of vulva and vagina: Secondary | ICD-10-CM

## 2018-12-14 DIAGNOSIS — Z8639 Personal history of other endocrine, nutritional and metabolic disease: Secondary | ICD-10-CM | POA: Diagnosis not present

## 2018-12-14 DIAGNOSIS — B3731 Acute candidiasis of vulva and vagina: Secondary | ICD-10-CM

## 2018-12-14 MED ORDER — GLIPIZIDE 5 MG PO TABS: 2.5000 mg | ORAL_TABLET | ORAL | 3 refills | Status: DC

## 2018-12-14 MED ORDER — FLUCONAZOLE 150 MG PO TABS: 150.0000 mg | ORAL_TABLET | Freq: Once | ORAL | 1 refills | Status: AC

## 2018-12-14 MED ORDER — GLIPIZIDE 5 MG PO TABS: 2.5000 mg | ORAL_TABLET | ORAL | 3 refills | Status: DC | PRN

## 2018-12-14 NOTE — Progress Notes (Signed)
Patient ID: Lisa Sutton, female   DOB: 1933/08/01, 85 y.o.   MRN: [id number]   HPI: Lisa Sutton is a 85 y.o.-year-old female whom I previously saw for hypercalcemia in 2016, now returning for DM2 (new condition for me), dx in 2015, non-insulin-dependent, uncontrolled, with complications (CKD, peripheral neuropathy).  Last visit 6 months ago.  She had to have a Diflucan 1 week ago for yeast vaginitis. Still has some itching.  She was going to [employer] sne[employer] 3 times a week, now off during the coronavirus pandemic.  Reviewed HbA1c levels: Lab Results  Component Value Date   HGBA1C 6.3 (H) 11/09/2018   HGBA1C 6.3 09/27/2018   HGBA1C 6.3 06/19/2018  02/15/2018: HbA1c 6.4% 10/07/2017: HbA1c 6.2% 06/10/2017: HbA1c 7.0% 01/2017: HbA1c 6.1%   She is on: - Glipizide 2.5 mg only before a larger dinner or if you have dessert - rarely has to take it We tried Metformin ER 500 mg with dinner 05/2018 -she had diarrhea and she stopped She could not afford Januvia.  Pt checks her sugars 2 times a day for review of her log - am:110-120, 134 >> 111, 122-145 >> 112-139, 148 - 2h after b'fast: 141-252 >> 122-145 >> n/c - before lunch:   100-136 >> n/c >> 84 >> n/c >> 118 - 2h after lunch:  n/c >> 140s, 170-180 >> n/c - before dinner: 109 >> 90-127 >> n/c - 2h after dinner: 73, 89, 120-169 >> n/c >> 101, 119-162, 182 >> 107-179, 185 - bedtime: 89-126 >> 140-150 >> n/c - nighttime: n/c >> 129, 150 >> n/c Lowest sugar was 73 >> 101 >> 107; she has hypoglycemia awareness in the 70s. Highest sugar was 200x1 >> 182 >> 185.   Glucometer: Accu-Chek Aviva  Pt's meals are: - Breakfast: 2 boiled eggs + toast + tomato or croissants + am and egg - Lunch: [place] sandwich + lettuce + tomato + mayo - Dinner: green beans, slaw + potatoes + meatloaf   - Snacks: crackers + PB Diet green tea  -+ CKD-sees nephrology, last BUN/creatinine:  Lab Results  Component Value Date   BUN 26 11/09/2018   BUN 26  02/15/2018   CREATININE 1.52 (H) 11/09/2018   CREATININE 1.28 (H) 02/15/2018  On irbesartan.  -+ HL; last set of lipids: Lab Results  Component Value Date   CHOL 224 (H) 11/09/2018   HDL 52 11/09/2018   LDLCALC 122 (H) 11/09/2018   LDLDIRECT 99 11/01/2016   TRIG 248 (H) 11/09/2018   CHOLHDL 4.3 11/09/2018  She could not tolerate statins because of leg cramps.   PCP recommended Vascepa (given samples) but this was not affordable. Now just diet on Crestor 2.5 mg daily -tolerates this well. - last eye exam was in 10/2017: No DR -+ Numbness and tingling in her feet.  Previously on Neurontin, which she stopped due to weight gain; now on B complex, alpha-lipoic acid, Cymbalta - qod.  Hypothyroidism: - On LT4 112 mcg daily + an extra 25 mcg Monday Wednesday Friday  Latest TSH was normal: Lab Results  Component Value Date   TSH 4.340 11/09/2018   She has a history of secondary (non-PTH mediated) hypercalcemia, due to HCTZ.  This normalized after stopping the medication: Lab Results  Component Value Date   PTH 11 (L) 06/04/2015   PTH Comment 06/04/2015   PTH 11 (L) 05/29/2015   PTH Comment 05/29/2015   PTH 6 (L) 01/13/2015   PTH Comment 01/13/2015   PTH 12 (L)  12/02/2014   PTH 5 (L) 11/26/2014   CALCIUM 8.9 11/09/2018   CALCIUM 9.3 06/19/2018   CALCIUM 9.4 02/15/2018   CALCIUM 9.8 10/07/2017   CALCIUM 9.6 06/10/2017   CALCIUM 9.4 01/27/2017   CALCIUM 10.7 (H) 01/21/2017   CALCIUM 10.2 11/01/2016   CALCIUM 9.5 09/03/2016   CALCIUM 9.7 04/21/2016   CALCIUM 9.3 09/18/2015   CALCIUM 8.9 09/17/2015   CALCIUM 8.6 (L) 09/16/2015   CALCIUM 9.6 09/08/2015   CALCIUM 10.5 (H) 09/03/2015   CALCIUM 9.3 06/04/2015   CALCIUM CANCELED 05/29/2015   CALCIUM CANCELED 05/27/2015   CALCIUM 11.0 (H) 04/28/2015   CALCIUM 11.8 (H) 04/26/2015   Vitamin D levels normal:: Lab Results  Component Value Date   VD25OH 47.0 11/09/2018   VD25OH 42.1 09/27/2018   VD25OH 27.8 (L) 06/19/2018    VD25OH 33.4 02/15/2018   VD25OH 32.6 10/07/2017   VD25OH 38.7 06/10/2017   VD25OH 40.5 01/21/2017   VD25OH 32.9 09/03/2016   VD25OH 63.1 05/27/2015   VD25OH 37.8 03/13/2015   She takes 1000 units vitamin D daily.  ROS: Constitutional: no weight gain/no weight loss, no fatigue, no subjective hyperthermia, no subjective hypothermia Eyes: no blurry vision, no xerophthalmia ENT: no sore throat, no nodules palpated in neck, no dysphagia, no odynophagia, no hoarseness Cardiovascular: no CP/no SOB/no palpitations/no leg swelling Respiratory: no cough/no SOB/no wheezing Gastrointestinal: no N/no V/no D/no C/no acid reflux Musculoskeletal: no muscle aches/no joint aches Skin: no rashes, no hair loss Neurological: no tremors/+ numbness/+ tingling/no dizziness  I reviewed pt's medications, allergies, PMH, social hx, family hx, and changes were documented in the history of present illness. Otherwise, unchanged from my initial visit note.  Past Medical History:  Diagnosis Date  . Anxiety   . Arthritis   . Cataract   . Dysphagia    'sometimes but not a major issue' been checked out by GI (per pt)  . Dysrhythmia    'heart used to skip but doesn't anymore' was checked out by Dr. Ron Parker late '90s, everything checked out ok and not had any skipping since (all per pt)  . Essential hypertension, benign   . Fatty liver   . GERD (gastroesophageal reflux disease)   . Hepatitis B    had at age 53, 'GI doc said it's gone away'  . Hepatitis B surface antigen positive   . History of hiatal hernia   . Hypercalcemia   . Hypothyroidism   . Kidney stone August 2014   Patient was [facility] at [facility]  . Obesity   . Osteoporosis   . PONV (postoperative nausea and vomiting)   . Type 2 diabetes mellitus ([place])    Past Surgical History:  Procedure Laterality Date  . ABDOMINAL HYSTERECTOMY    . BILATERAL CARPAL TUNNEL RELEASE     Dr. Marlou Sa at surgical center  . BIOPSY  05/26/2017   Procedure: BIOPSY;  Surgeon:  Rogene Houston, MD;  [facility];  Service: Endoscopy;;  colon  . BIOPSY  10/11/2018   Procedure: BIOPSY;  Surgeon: Rogene Houston, MD;  [facility];  Service: Endoscopy;;  . CATARACT EXTRACTION, BILATERAL    . COLONOSCOPY N/A 05/26/2017   Procedure: COLONOSCOPY;  Surgeon: Rogene Houston, MD;  [facility];  Service: Endoscopy;  Laterality: N/A;  10:30  . Deviated septum repair    . ESOPHAGOGASTRODUODENOSCOPY N/A 07/28/2016   Procedure: ESOPHAGOGASTRODUODENOSCOPY (EGD);  Surgeon: Rogene Houston, MD;  [facility];  Service: Endoscopy;  Laterality: N/A;  300 - moved to 3/7 @ 1:00  . EYE SURGERY    . FLEXIBLE SIGMOIDOSCOPY N/A 10/11/2018   Procedure: FLEXIBLE SIGMOIDOSCOPY;  Surgeon: Rogene Houston, MD;  [facility];  Service: Endoscopy;  Laterality: N/A;  unable to complete colonoscopy   . KNEE ARTHROPLASTY Left 09/15/2015   Procedure: COMPUTER ASSISTED TOTAL KNEE ARTHROPLASTY;  Surgeon: Marybelle Killings, MD;  Location: [facility] [place];  Service: Orthopedics;  Laterality: Left;  . KNEE ARTHROSCOPY     left knee 2003   Social History   Socioeconomic History  . Marital status: Widowed    Spouse name: Not on file  . Number of children: Not on file  . Years of education: Not on file  . Highest education level: Not on file  Occupational History  . Not on file  Social Needs  . Financial resource strain: Not on file  . Food insecurity    Worry: Not on file    Inability: Not on file  . Transportation needs    Medical: Not on file    Non-medical: Not on file  Tobacco Use  . Smoking status: Never Smoker  . Smokeless tobacco: Never Used  Substance and Sexual Activity  . Alcohol use: No    Alcohol/week: 0.0 standard drinks  . Drug use: No  . Sexual activity: Never  Lifestyle  . Physical activity    Days per week: Not on file    Minutes per session: Not on file  . Stress: Not on file  Relationships  . Social Herbalist on  phone: Not on file    Gets together: Not on file    Attends religious service: Not on file    Active member of club or organization: Not on file    Attends meetings of clubs or organizations: Not on file    Relationship status: Not on file  . Intimate partner violence    Fear of current or ex partner: Not on file    Emotionally abused: Not on file    Physically abused: Not on file    Forced sexual activity: Not on file  Other Topics Concern  . Not on file  Social History Narrative  . Not on file   Current Outpatient Medications on File Prior to Visit  Medication Sig Dispense Refill  . ACCU-CHEK AVIVA PLUS test strip CHECK BLOOD SUGAR TWO TIMES DAILY 200 each 10  . acetaminophen (TYLENOL) 500 MG tablet Take 1,000 mg by mouth daily as needed for moderate pain or headache.    . Alpha-Lipoic Acid 600 MG TABS Take 600 mg by mouth 2 (two) times a day.    . ALPRAZolam (XANAX) 0.25 MG tablet Take 1 tablet (0.25 mg total) by mouth 2 (two) times daily as needed for anxiety. 180 tablet 1  . amLODipine (NORVASC) 5 MG tablet TAKE 1 TABLET BY MOUTH  DAILY 90 tablet 1  . atenolol (TENORMIN) 50 MG tablet Take 1 tablet (50 mg total) by mouth daily. 90 tablet 3  . b complex vitamins tablet Take 1 tablet by mouth daily.    . Cholecalciferol (VITAMIN D3) 1000 units CAPS Take 1,000 Units by mouth daily.     . DULoxetine (CYMBALTA) 30 MG capsule Take 1 capsule (30 mg total) by mouth daily. (Patient taking differently: Take 30 mg by mouth every other day. ) 90 capsule 3  . glipiZIDE (GLUCOTROL) 5 MG tablet Take 2.5 mg by mouth See admin instructions. Only  take if having a big meal at dinner    . irbesartan (AVAPRO) 150 MG tablet Take 1 tablet (150 mg total) by mouth daily. 90 tablet 3  . levothyroxine (SYNTHROID, LEVOTHROID) 112 MCG tablet Take 1 tablet (112 mcg total) by mouth daily. (Patient taking differently: Take 112-137 mcg by mouth See admin instructions. 112 mcg Sun, Tues Thurs and Sat, 137 mcg Mon Wed  and Fri (takes 112 and 25 mcg)) 90 tablet 3  . levothyroxine (SYNTHROID, LEVOTHROID) 25 MCG tablet TAKE 1 TABLET BY MOUTH  DAILY BEFORE BREAKFAST on MON, WED and FRI only (Patient taking differently: Take 25 mcg by mouth See admin instructions. TAKE BY MOUTH  DAILY BEFORE BREAKFAST  137 mcg Mon Wed and Fri (takes 112 and 25 mcg)) 36 tablet 3  . loperamide (IMODIUM) 2 MG capsule Take 1 capsule (2 mg total) by mouth daily after breakfast.    . Multiple Vitamin (MULTIVITAMIN WITH MINERALS) TABS tablet Take 1 tablet by mouth daily.    . pantoprazole (PROTONIX) 40 MG tablet TAKE 1 TABLET BY MOUTH  DAILY BEFORE BREAKFAST (Patient taking differently: Take 40 mg by mouth every morning. ) 90 tablet 3  . psyllium (METAMUCIL SMOOTH TEXTURE) 58.6 % powder Take 1 packet by mouth daily. 283 g 12  . rosuvastatin (CRESTOR) 5 MG tablet Take 1 tablet (5 mg total) by mouth daily. (Patient taking differently: Take 2.5 mg by mouth daily. ) 90 tablet 3  . [DISCONTINUED] calcium citrate-vitamin D 200-200 MG-UNIT TABS Take 1 tablet by mouth daily.       No current facility-administered medications on file prior to visit.    Allergies  Allergen Reactions  . Statins Other (See Comments)    'discomfort, aching everywhere' Tolerates to livalo   . Zetia [Ezetimibe] Other (See Comments)    Leg pain  . Zocor [Simvastatin] Other (See Comments)    Leg pain   Family History  Problem Relation Age of Onset  . Hypertension Father   . Hip fracture Father   . COPD Mother   . Cancer Maternal Aunt   . Diabetes Maternal Aunt   . Diabetes Maternal Uncle   . Colon cancer Neg Hx     PE: BP 118/72   Pulse 82   Ht 5\' 3"  (1.6 m)   Wt 166 lb (75.3 kg)   SpO2 98%   BMI 29.41 kg/m  Wt Readings from Last 3 Encounters:  12/14/18 166 lb (75.3 kg)  10/11/18 161 lb (73 kg)  07/03/18 169 lb (76.7 kg)   Constitutional: overweight, in NAD Eyes: PERRLA, EOMI, no exophthalmos ENT: moist mucous membranes, no thyromegaly, no  cervical lymphadenopathy Cardiovascular: RRR, No MRG Respiratory: CTA B Gastrointestinal: abdomen soft, NT, ND, BS+ Musculoskeletal: no deformities, strength intact in all 4 Skin: moist, warm, no rashes Neurological: no tremor with outstretched hands, DTR normal in all 4  ASSESSMENT: 1. DM2, non-insulin-dependent, uncontrolled, with complications - CKD - PN  2. H/o Hypercalcemia  3. Hypothyroidism  4. HL  5.  Yeast vaginitis  PLAN:  1. Patient with history of controlled diabetes, on glipizide with larger meals and now also on metformin low dose, added at last visit.  In the past, we had to reduce and then stop metformin due to diarrhea but at last visit sugars are slightly higher and I advised her to retry the lowest metformin dose.  Of note, her diarrhea was with higher doses in the past.  At this visit, however, she tells me that  she could not tolerate it and she is off the metformin completely.  She is just the glipizide on a prn basis depending on the size of her meal.  Reviewing her sugar log today, sugars are mostly at goal with few, mild, hyperglycemic spikes -Latest HbA1c was checked a month ago and this was stable, at 6.3%. -No need to intensify her regimen for now. - I suggested to:  Patient Instructions  Please continue: - Glipizide 2.5 mg only before a larger dinner or if you have dessert  I send a prescription for Diflucan to your pharmacy.  Please return in 6 months with your sugar log.   - advised to check sugars at different times of the day - 1x a day, rotating check times - advised for yearly eye exams >> she is not UTD - return to clinic in 6 months      2. H/o Hypercalcemia -Mild, resolved per review of latest labs -PTH not elevated -Latest vitamin D level was normal -This is probably due to HCTZ.  She is now off the medication.  3. Hypothyroidism -Well-controlled, latest TSH was normal a month ago: Lab Results  Component Value Date   TSH 4.340  11/09/2018   4. HL - Reviewed latest lipid panel: LDL above target, as is the triglyceride level Lab Results  Component Value Date   CHOL 224 (H) 11/09/2018   HDL 52 11/09/2018   LDLCALC 122 (H) 11/09/2018   LDLDIRECT 99 11/01/2016   TRIG 248 (H) 11/09/2018   CHOLHDL 4.3 11/09/2018  -Intolerant to statins in the past but she now tolerates Crestor 2.5 mg daily started after the above lipid panel.  She has appointment with PCP soon to recheck her lipid panel.  5.  Yeast vaginitis -Called in another tablet of Diflucan since the symptoms were not completely resolved after just 1.  Also discussed that she can use over-the-counter creams.  She will check with her pharmacist.  Philemon Kingdom, MD PhD [facility]

## 2018-12-14 NOTE — Patient Instructions (Addendum)
Please continue: - Glipizide 2.5 mg only before a larger dinner or if you have dessert  I send a prescription for Diflucan to your pharmacy.  Please return in 6 months with your sugar log.

## 2018-12-14 NOTE — Addendum Note (Signed)
Addended by: Cardell Peach I on: 12/14/2018 01:46 PM   Modules accepted: Orders

## 2018-12-15 ENCOUNTER — Other Ambulatory Visit: Payer: Self-pay

## 2018-12-15 MED ORDER — GLIPIZIDE 5 MG PO TABS: ORAL_TABLET | ORAL | 3 refills | Status: DC

## 2018-12-18 ENCOUNTER — Other Ambulatory Visit (INDEPENDENT_AMBULATORY_CARE_PROVIDER_SITE_OTHER): Payer: Self-pay | Admitting: *Deleted

## 2018-12-18 DIAGNOSIS — R748 Abnormal levels of other serum enzymes: Secondary | ICD-10-CM

## 2018-12-19 ENCOUNTER — Ambulatory Visit (INDEPENDENT_AMBULATORY_CARE_PROVIDER_SITE_OTHER): Payer: Medicare Other | Admitting: Internal Medicine

## 2018-12-20 ENCOUNTER — Other Ambulatory Visit: Payer: Medicare Other

## 2018-12-20 ENCOUNTER — Other Ambulatory Visit: Payer: Self-pay

## 2018-12-20 DIAGNOSIS — K760 Fatty (change of) liver, not elsewhere classified: Secondary | ICD-10-CM | POA: Diagnosis not present

## 2018-12-21 LAB — HEPATIC FUNCTION PANEL
ALT: 25 IU/L (ref 0–32)
AST: 32 IU/L (ref 0–40)
Albumin: 3.6 g/dL (ref 3.6–4.6)
Alkaline Phosphatase: 129 IU/L — ABNORMAL HIGH (ref 39–117)
Bilirubin Total: <0.2 mg/dL (ref 0.0–1.2)
Bilirubin, Direct: 0.07 mg/dL (ref 0.00–0.40)
Total Protein: 6.4 g/dL (ref 6.0–8.5)

## 2019-01-01 ENCOUNTER — Ambulatory Visit (INDEPENDENT_AMBULATORY_CARE_PROVIDER_SITE_OTHER): Payer: Medicare Other | Admitting: Family Medicine

## 2019-01-01 DIAGNOSIS — N898 Other specified noninflammatory disorders of vagina: Secondary | ICD-10-CM

## 2019-01-01 NOTE — Progress Notes (Signed)
Telephone visit  Subjective: CC: ?UTI/ yeast PCP: Lisa Gouty, FNP LPF:Lisa Sutton is a 85 y.o. female calls for telephone consult today. Patient provides verbal consent for consult held via phone.  Location of patient: home Location of provider: Working remotely from home Others present for call: none  1. Vaginal symptoms Patient reports that she has itching and burning of her "bottom".  She does report burning with urination but notes that this is external.  Denies any hematuria, increased urinary frequency or urgency.  She is status post treatment with Diflucan x2 over the last 3 weeks with her endocrinologist.  She notes that this did seem to resolve her symptoms previously but it continues to return.  Her blood sugars have been under fairly good control with A1c of 6.3 last visit.   ROS: Per HPI  Allergies  Allergen Reactions  . Statins Other (See Comments)    'discomfort, aching everywhere' Tolerates to livalo   . Zetia [Ezetimibe] Other (See Comments)    Leg pain  . Zocor [Simvastatin] Other (See Comments)    Leg pain   Past Medical History:  Diagnosis Date  . Anxiety   . Arthritis   . Cataract   . Dysphagia    'sometimes but not a major issue' been checked out by GI (per pt)  . Dysrhythmia    'heart used to skip but doesn't anymore' was checked out by Dr. Ron Parker late '90s, everything checked out ok and not had any skipping since (all per pt)  . Essential hypertension, benign   . Fatty liver   . GERD (gastroesophageal reflux disease)   . Hepatitis B    had at age 81, 'GI doc said it's gone away'  . Hepatitis B surface antigen positive   . History of hiatal hernia   . Hypercalcemia   . Hypothyroidism   . Kidney stone August 2014   Patient was [facility] at [facility]  . Obesity   . Osteoporosis   . PONV (postoperative nausea and vomiting)   . Type 2 diabetes mellitus ([place])     Current Outpatient Medications:  .  ACCU-CHEK AVIVA PLUS test strip, CHECK BLOOD  SUGAR TWO TIMES DAILY, Disp: 200 each, Rfl: 10 .  acetaminophen (TYLENOL) 500 MG tablet, Take 1,000 mg by mouth daily as needed for moderate pain or headache., Disp: , Rfl:  .  Alpha-Lipoic Acid 600 MG TABS, Take 600 mg by mouth 2 (two) times a day., Disp: , Rfl:  .  ALPRAZolam (XANAX) 0.25 MG tablet, Take 1 tablet (0.25 mg total) by mouth 2 (two) times daily as needed for anxiety., Disp: 180 tablet, Rfl: 1 .  amLODipine (NORVASC) 5 MG tablet, TAKE 1 TABLET BY MOUTH  DAILY, Disp: 90 tablet, Rfl: 1 .  atenolol (TENORMIN) 50 MG tablet, Take 1 tablet (50 mg total) by mouth daily., Disp: 90 tablet, Rfl: 3 .  b complex vitamins tablet, Take 1 tablet by mouth daily., Disp: , Rfl:  .  Cholecalciferol (VITAMIN D3) 1000 units CAPS, Take 1,000 Units by mouth daily. , Disp: , Rfl:  .  DULoxetine (CYMBALTA) 30 MG capsule, Take 1 capsule (30 mg total) by mouth daily. (Patient taking differently: Take 30 mg by mouth every other day. ), Disp: 90 capsule, Rfl: 3 .  glipiZIDE (GLUCOTROL) 5 MG tablet, Take 2.5 MG DAILY ONLY IF HAVING A LARGE MEAL AT DINNER., Disp: 90 tablet, Rfl: 3 .  irbesartan (AVAPRO) 150 MG tablet, Take 1 tablet (150 mg total)  by mouth daily., Disp: 90 tablet, Rfl: 3 .  levothyroxine (SYNTHROID, LEVOTHROID) 112 MCG tablet, Take 1 tablet (112 mcg total) by mouth daily. (Patient taking differently: Take 112-137 mcg by mouth See admin instructions. 112 mcg Sun, Tues Thurs and Sat, 137 mcg Mon Wed and Fri (takes 112 and 25 mcg)), Disp: 90 tablet, Rfl: 3 .  levothyroxine (SYNTHROID, LEVOTHROID) 25 MCG tablet, TAKE 1 TABLET BY MOUTH  DAILY BEFORE BREAKFAST on MON, WED and FRI only (Patient taking differently: Take 25 mcg by mouth See admin instructions. TAKE BY MOUTH  DAILY BEFORE BREAKFAST  137 mcg Mon Wed and Fri (takes 112 and 25 mcg)), Disp: 36 tablet, Rfl: 3 .  loperamide (IMODIUM) 2 MG capsule, Take 1 capsule (2 mg total) by mouth daily after breakfast., Disp: , Rfl:  .  Multiple Vitamin  (MULTIVITAMIN WITH MINERALS) TABS tablet, Take 1 tablet by mouth daily., Disp: , Rfl:  .  pantoprazole (PROTONIX) 40 MG tablet, TAKE 1 TABLET BY MOUTH  DAILY BEFORE BREAKFAST (Patient taking differently: Take 40 mg by mouth every morning. ), Disp: 90 tablet, Rfl: 3 .  psyllium (METAMUCIL SMOOTH TEXTURE) 58.6 % powder, Take 1 packet by mouth daily., Disp: 283 g, Rfl: 12 .  rosuvastatin (CRESTOR) 5 MG tablet, Take 1 tablet (5 mg total) by mouth daily. (Patient taking differently: Take 2.5 mg by mouth daily. ), Disp: 90 tablet, Rfl: 3  Assessment/ Plan: 85 y.o. female   1. Vaginal pruritus Possibly related to fungal infection versus vaginal dermatitis (considering lichen sclerosis given age).  She has some leftover triamcinolone from her previous skin condition and I have recommended that she proceed with this externally twice daily for up to 7 days if needed for pain and inflammation.  She will come in to provide a urine sample later this morning.  I will contact her with results once available.  Orders Placed This Encounter  Procedures  . WET PREP FOR [place], YEAST, CLUE  . Urinalysis, Complete   Start time: 8:13am End time: 8:19am  Total time spent on patient care (including telephone call/ virtual visit): 15 minutes  [place], [place] [phone]

## 2019-01-02 ENCOUNTER — Telehealth: Payer: Self-pay | Admitting: Family Medicine

## 2019-01-02 LAB — URINALYSIS, COMPLETE
Bilirubin, UA: NEGATIVE
Glucose, UA: NEGATIVE
Ketones, UA: NEGATIVE
Leukocytes,UA: NEGATIVE
Nitrite, UA: NEGATIVE
RBC, UA: NEGATIVE
Specific Gravity, UA: 1.023 (ref 1.005–1.030)
Urobilinogen, Ur: 0.2 mg/dL (ref 0.2–1.0)
pH, UA: 6 (ref 5.0–7.5)

## 2019-01-02 LAB — MICROSCOPIC EXAMINATION
Bacteria, UA: NONE SEEN
Casts: NONE SEEN /lpf

## 2019-01-02 LAB — WET PREP FOR TRICH, YEAST, CLUE
Clue Cell Exam: NEGATIVE
Trichomonas Exam: NEGATIVE
Yeast Exam: NEGATIVE

## 2019-01-02 NOTE — Telephone Encounter (Signed)
Pt aware.

## 2019-01-02 NOTE — Telephone Encounter (Signed)
Yes, ok to use for 1 week max.

## 2019-01-02 NOTE — Telephone Encounter (Signed)
Patient wanted to know if she could still use her itching cream? States she talked to Dr. Darnell Level about it yesterday and she said only use a little until the UA results are back. Please review and advise

## 2019-01-04 ENCOUNTER — Other Ambulatory Visit: Payer: Self-pay | Admitting: Family Medicine

## 2019-01-08 ENCOUNTER — Ambulatory Visit: Payer: Medicare Other | Admitting: Family Medicine

## 2019-01-09 DIAGNOSIS — R748 Abnormal levels of other serum enzymes: Secondary | ICD-10-CM | POA: Diagnosis not present

## 2019-01-09 LAB — HEPATIC FUNCTION PANEL
AG Ratio: 1.3 (calc) (ref 1.0–2.5)
ALT: 28 U/L (ref 6–29)
AST: 25 U/L (ref 10–35)
Albumin: 3.6 g/dL (ref 3.6–5.1)
Alkaline phosphatase (APISO): 182 U/L — ABNORMAL HIGH (ref 37–153)
Bilirubin, Direct: 0.1 mg/dL (ref 0.0–0.2)
Globulin: 2.7 g/dL (calc) (ref 1.9–3.7)
Indirect Bilirubin: 0.3 mg/dL (calc) (ref 0.2–1.2)
Total Bilirubin: 0.4 mg/dL (ref 0.2–1.2)
Total Protein: 6.3 g/dL (ref 6.1–8.1)

## 2019-01-10 ENCOUNTER — Other Ambulatory Visit: Payer: Self-pay | Admitting: Family Medicine

## 2019-01-11 ENCOUNTER — Other Ambulatory Visit: Payer: Self-pay | Admitting: Family Medicine

## 2019-01-11 DIAGNOSIS — R809 Proteinuria, unspecified: Secondary | ICD-10-CM

## 2019-01-11 DIAGNOSIS — Z1159 Encounter for screening for other viral diseases: Secondary | ICD-10-CM | POA: Diagnosis not present

## 2019-01-15 ENCOUNTER — Encounter (INDEPENDENT_AMBULATORY_CARE_PROVIDER_SITE_OTHER): Payer: Self-pay | Admitting: Internal Medicine

## 2019-01-15 ENCOUNTER — Ambulatory Visit (INDEPENDENT_AMBULATORY_CARE_PROVIDER_SITE_OTHER): Payer: Medicare Other | Admitting: Internal Medicine

## 2019-01-15 ENCOUNTER — Other Ambulatory Visit: Payer: Self-pay

## 2019-01-15 VITALS — BP 106/67 | HR 59 | Temp 98.3°F | Resp 18 | Ht 63.0 in | Wt 165.0 lb

## 2019-01-15 DIAGNOSIS — K591 Functional diarrhea: Secondary | ICD-10-CM | POA: Diagnosis not present

## 2019-01-15 DIAGNOSIS — R748 Abnormal levels of other serum enzymes: Secondary | ICD-10-CM | POA: Insufficient documentation

## 2019-01-15 NOTE — Patient Instructions (Signed)
Please take Imodium 2 mg every morning and Metamucil at bedtime. Keep stool diary as to frequency and consistency for next 4 to 8 weeks. Please call office if stool frequency worsens. LFTs in 8 weeks.

## 2019-01-15 NOTE — Progress Notes (Signed)
Presenting complaint;  Follow-up for diarrhea.  Elevated alkaline phosphatase.  Database and subjective:  Patient is 85 year old Caucasian female who has a history of chronic GERD diarrhea who was last seen in October 2019 was here for scheduled visit.  She had lab studies last week and her alkaline phosphatase was elevated.  She underwent flexible sigmoidoscopy with biopsy in May this year.  She had sigmoid diverticulosis but no endoscopic colitis.  Biopsy was negative for microscopic or lymphocytic colitis.  I felt her symptoms are typical of IBS at late onset.  She was advised to take loperamide 2 mg every morning and Metamucil 4 g daily.  She now returns for follow-up visit.  She continues to complain of diarrhea.  She usually has 2-3 bowel movements per day.  Most of her bowel movements occur within minutes of meals.  She denies abdominal pain melena or rectal bleeding or accidents.  At times she has had urgency.  She has not experienced nocturnal bowel movement.  She states 50% of her stools are formed and rest are mushy.  She has good appetite and has not lost any weight.  She tells me that when she takes Imodium and Metamucil daily she does not have diarrhea but she forgets to take it. She states she has not had any heartburn since she started pantoprazole.  She also denies dysphagia.  Current Medications: Outpatient Encounter Medications as of 01/15/2019  Medication Sig  . ACCU-CHEK AVIVA PLUS test strip CHECK BLOOD SUGAR TWO TIMES DAILY  . acetaminophen (TYLENOL) 500 MG tablet Take 1,000 mg by mouth daily as needed for moderate pain or headache.  . Alpha-Lipoic Acid 600 MG TABS Take 600 mg by mouth 2 (two) times a day.  . ALPRAZolam (XANAX) 0.25 MG tablet Take 1 tablet (0.25 mg total) by mouth 2 (two) times daily as needed for anxiety.  Marland Kitchen amLODipine (NORVASC) 5 MG tablet TAKE 1 TABLET BY MOUTH  DAILY  . atenolol (TENORMIN) 50 MG tablet Take 1 tablet (50 mg total) by mouth daily.  Marland Kitchen b  complex vitamins tablet Take 1 tablet by mouth daily.  . Cholecalciferol (VITAMIN D3) 1000 units CAPS Take 1,000 Units by mouth daily.   . DULoxetine (CYMBALTA) 30 MG capsule Take 1 capsule (30 mg total) by mouth daily. (Patient taking differently: Take 30 mg by mouth every other day. )  . glipiZIDE (GLUCOTROL) 5 MG tablet Take 2.5 MG DAILY ONLY IF HAVING A LARGE MEAL AT DINNER.  Marland Kitchen irbesartan (AVAPRO) 150 MG tablet Take 1 tablet (150 mg total) by mouth daily.  Marland Kitchen levothyroxine (SYNTHROID) 112 MCG tablet TAKE 1 TABLET BY MOUTH  DAILY ( TAKE WITH 25MCG 3  TIMES WEEKLY ) AS DIRECTED  . levothyroxine (SYNTHROID, LEVOTHROID) 25 MCG tablet TAKE 1 TABLET BY MOUTH  DAILY BEFORE BREAKFAST on MON, WED and FRI only (Patient taking differently: Take 25 mcg by mouth See admin instructions. TAKE BY MOUTH  DAILY BEFORE BREAKFAST  137 mcg Mon Wed and Fri (takes 112 and 25 mcg))  . loperamide (IMODIUM) 2 MG capsule Take 1 capsule (2 mg total) by mouth daily after breakfast.  . Multiple Vitamin (MULTIVITAMIN WITH MINERALS) TABS tablet Take 1 tablet by mouth daily.  . pantoprazole (PROTONIX) 40 MG tablet TAKE 1 TABLET BY MOUTH  DAILY BEFORE BREAKFAST (Patient taking differently: Take 40 mg by mouth every morning. )  . psyllium (METAMUCIL SMOOTH TEXTURE) 58.6 % powder Take 1 packet by mouth daily.  . rosuvastatin (CRESTOR) 5 MG tablet  Take 1 tablet (5 mg total) by mouth daily. (Patient taking differently: Take 2.5 mg by mouth daily. )  . [DISCONTINUED] calcium citrate-vitamin D 200-200 MG-UNIT TABS Take 1 tablet by mouth daily.     No facility-administered encounter medications on file as of 01/15/2019.      Objective: Blood pressure 106/67, pulse (!) 59, temperature 98.3 F (36.8 C), temperature source Oral, resp. rate 18, height 5' 3"  (1.6 m), weight 165 lb (74.8 kg). Patient is alert and in no acute distress. Conjunctiva is pink. Sclera is nonicteric Oropharyngeal mucosa is normal. No neck masses or thyromegaly  noted. Cardiac exam with regular rhythm normal S1 and S2. No murmur or gallop noted. Lungs are clear to auscultation. Abdomen is symmetrical.  Bowel sounds are normal.  On palpation abdomen is soft and nontender with organomegaly or masses. No LE edema or clubbing noted.  Labs/studies Results:  CBC Latest Ref Rng & Units 11/09/2018 06/19/2018 02/15/2018  WBC 3.4 - 10.8 x10E3/uL 7.7 8.2 7.4  Hemoglobin 11.1 - 15.9 g/dL 14.3 14.3 14.0  Hematocrit 34.0 - 46.6 % 42.2 41.3 41.6  Platelets 150 - 450 x10E3/uL 204 263 240    CMP Latest Ref Rng & Units 01/09/2019 12/20/2018 11/09/2018  Glucose 65 - 99 mg/dL - - 163(H)  BUN 8 - 27 mg/dL - - 26  Creatinine 0.57 - 1.00 mg/dL - - 1.52(H)  Sodium 134 - 144 mmol/L - - 140  Potassium 3.5 - 5.2 mmol/L - - 5.1  Chloride 96 - 106 mmol/L - - 103  CO2 20 - 29 mmol/L - - 22  Calcium 8.7 - 10.3 mg/dL - - 8.9  Total Protein 6.1 - 8.1 g/dL 6.3 6.4 6.7  Total Bilirubin 0.2 - 1.2 mg/dL 0.4 <0.2 0.2  Alkaline Phos 39 - 117 IU/L - 129(H) 135(H)  AST 10 - 35 U/L 25 32 25  ALT 6 - 29 U/L 28 25 23     Hepatic Function Latest Ref Rng & Units 01/09/2019 12/20/2018 11/09/2018  Total Protein 6.1 - 8.1 g/dL 6.3 6.4 6.7  Albumin 3.6 - 4.6 g/dL - 3.6 3.9  AST 10 - 35 U/L 25 32 25  ALT 6 - 29 U/L 28 25 23   Alk Phosphatase 39 - 117 IU/L - 129(H) 135(H)  Total Bilirubin 0.2 - 1.2 mg/dL 0.4 <0.2 0.2  Bilirubin, Direct 0.0 - 0.2 mg/dL 0.1 0.07 0.08      Assessment:  #1.  Chronic diarrhea.  Diarrhea is typical of irritable bowel syndrome.  She does not have any alarm symptoms.  Sigmoid colon biopsy 3 months ago was negative for microscopic colitis.  Since she has normal bowel movements with low-dose loperamide and fiber supplement she should take them daily.  If she has diarrhea despite daily use may consider therapy with Xifaxan or low-dose antispasmodic. I do not believe we are dealing with diabetic diarrhea or small bowel disease.  #2.  Mildly elevated alkaline  phosphatase.  Transaminases are normal.  Alkaline phosphatase back in July was also mildly elevated.  However normal range for these 2 tests is normal.  Last ultrasound was in February 2018 revealing echogenic liver but no abnormalities.  Patient has tested HBsAg positive in the past her HBV DNA has been undetectable making her an inactive carrier.  #3.  GERD.  She is doing well with therapy.  Would consider dropping PPI dose at her next visit.  Plan:  Patient advised to take loperamide and Metamucil every day. She will keep stool  diary for the next few weeks and call with progress report. LFTs in 8 weeks. Office visit in 6 months.

## 2019-01-18 ENCOUNTER — Other Ambulatory Visit: Payer: Self-pay | Admitting: Family Medicine

## 2019-01-18 ENCOUNTER — Other Ambulatory Visit: Payer: Self-pay | Admitting: Internal Medicine

## 2019-01-19 ENCOUNTER — Encounter: Payer: Self-pay | Admitting: Family Medicine

## 2019-01-22 ENCOUNTER — Ambulatory Visit (INDEPENDENT_AMBULATORY_CARE_PROVIDER_SITE_OTHER): Payer: Medicare Other | Admitting: Internal Medicine

## 2019-01-26 ENCOUNTER — Ambulatory Visit: Payer: Medicare Other | Admitting: Family Medicine

## 2019-02-13 ENCOUNTER — Other Ambulatory Visit: Payer: Self-pay | Admitting: *Deleted

## 2019-02-13 DIAGNOSIS — E559 Vitamin D deficiency, unspecified: Secondary | ICD-10-CM

## 2019-02-13 DIAGNOSIS — R748 Abnormal levels of other serum enzymes: Secondary | ICD-10-CM | POA: Diagnosis not present

## 2019-02-13 DIAGNOSIS — E034 Atrophy of thyroid (acquired): Secondary | ICD-10-CM

## 2019-02-13 DIAGNOSIS — I10 Essential (primary) hypertension: Secondary | ICD-10-CM

## 2019-02-13 DIAGNOSIS — K760 Fatty (change of) liver, not elsewhere classified: Secondary | ICD-10-CM

## 2019-02-13 DIAGNOSIS — E1121 Type 2 diabetes mellitus with diabetic nephropathy: Secondary | ICD-10-CM

## 2019-02-13 DIAGNOSIS — K219 Gastro-esophageal reflux disease without esophagitis: Secondary | ICD-10-CM

## 2019-02-13 DIAGNOSIS — N1832 Chronic kidney disease, stage 3b: Secondary | ICD-10-CM

## 2019-02-13 DIAGNOSIS — E782 Mixed hyperlipidemia: Secondary | ICD-10-CM

## 2019-02-13 LAB — HEPATIC FUNCTION PANEL
AG Ratio: 1.3 (calc) (ref 1.0–2.5)
ALT: 17 U/L (ref 6–29)
AST: 20 U/L (ref 10–35)
Albumin: 3.6 g/dL (ref 3.6–5.1)
Alkaline phosphatase (APISO): 123 U/L (ref 37–153)
Bilirubin, Direct: 0.1 mg/dL (ref 0.0–0.2)
Globulin: 2.7 g/dL (calc) (ref 1.9–3.7)
Indirect Bilirubin: 0.2 mg/dL (calc) (ref 0.2–1.2)
Total Bilirubin: 0.3 mg/dL (ref 0.2–1.2)
Total Protein: 6.3 g/dL (ref 6.1–8.1)

## 2019-02-15 ENCOUNTER — Other Ambulatory Visit (INDEPENDENT_AMBULATORY_CARE_PROVIDER_SITE_OTHER): Payer: Self-pay | Admitting: Internal Medicine

## 2019-02-15 MED ORDER — PANTOPRAZOLE SODIUM 20 MG PO TBEC: 20.0000 mg | DELAYED_RELEASE_TABLET | Freq: Every day | ORAL | 3 refills | Status: DC

## 2019-02-16 ENCOUNTER — Other Ambulatory Visit (INDEPENDENT_AMBULATORY_CARE_PROVIDER_SITE_OTHER): Payer: Self-pay | Admitting: *Deleted

## 2019-02-16 DIAGNOSIS — R748 Abnormal levels of other serum enzymes: Secondary | ICD-10-CM

## 2019-02-19 ENCOUNTER — Encounter: Payer: Self-pay | Admitting: Family Medicine

## 2019-02-19 ENCOUNTER — Other Ambulatory Visit: Payer: Self-pay | Admitting: Family Medicine

## 2019-02-19 DIAGNOSIS — E785 Hyperlipidemia, unspecified: Secondary | ICD-10-CM

## 2019-02-19 DIAGNOSIS — N183 Chronic kidney disease, stage 3 unspecified: Secondary | ICD-10-CM

## 2019-02-19 DIAGNOSIS — E1121 Type 2 diabetes mellitus with diabetic nephropathy: Secondary | ICD-10-CM

## 2019-02-19 DIAGNOSIS — I10 Essential (primary) hypertension: Secondary | ICD-10-CM | POA: Insufficient documentation

## 2019-02-19 DIAGNOSIS — E1169 Type 2 diabetes mellitus with other specified complication: Secondary | ICD-10-CM

## 2019-02-19 DIAGNOSIS — E1122 Type 2 diabetes mellitus with diabetic chronic kidney disease: Secondary | ICD-10-CM | POA: Insufficient documentation

## 2019-02-19 DIAGNOSIS — E559 Vitamin D deficiency, unspecified: Secondary | ICD-10-CM

## 2019-02-19 DIAGNOSIS — K219 Gastro-esophageal reflux disease without esophagitis: Secondary | ICD-10-CM

## 2019-02-19 DIAGNOSIS — E1159 Type 2 diabetes mellitus with other circulatory complications: Secondary | ICD-10-CM

## 2019-02-26 ENCOUNTER — Other Ambulatory Visit: Payer: Self-pay

## 2019-02-26 ENCOUNTER — Other Ambulatory Visit: Payer: Medicare Other

## 2019-02-26 DIAGNOSIS — E034 Atrophy of thyroid (acquired): Secondary | ICD-10-CM | POA: Diagnosis not present

## 2019-02-26 DIAGNOSIS — E1121 Type 2 diabetes mellitus with diabetic nephropathy: Secondary | ICD-10-CM | POA: Diagnosis not present

## 2019-02-26 DIAGNOSIS — I10 Essential (primary) hypertension: Secondary | ICD-10-CM | POA: Diagnosis not present

## 2019-02-26 DIAGNOSIS — E782 Mixed hyperlipidemia: Secondary | ICD-10-CM | POA: Diagnosis not present

## 2019-02-26 DIAGNOSIS — K760 Fatty (change of) liver, not elsewhere classified: Secondary | ICD-10-CM | POA: Diagnosis not present

## 2019-02-26 DIAGNOSIS — E559 Vitamin D deficiency, unspecified: Secondary | ICD-10-CM | POA: Diagnosis not present

## 2019-02-26 LAB — BAYER DCA HB A1C WAIVED: HB A1C (BAYER DCA - WAIVED): 6.1 % (ref <7.0)

## 2019-02-27 LAB — CMP14+EGFR
ALT: 23 IU/L (ref 0–32)
AST: 26 IU/L (ref 0–40)
Albumin/Globulin Ratio: 1.4 (ref 1.2–2.2)
Albumin: 3.5 g/dL — ABNORMAL LOW (ref 3.6–4.6)
Alkaline Phosphatase: 153 IU/L — ABNORMAL HIGH (ref 39–117)
BUN/Creatinine Ratio: 22 (ref 12–28)
BUN: 28 mg/dL — ABNORMAL HIGH (ref 8–27)
Bilirubin Total: 0.3 mg/dL (ref 0.0–1.2)
CO2: 22 mmol/L (ref 20–29)
Calcium: 8.6 mg/dL — ABNORMAL LOW (ref 8.7–10.3)
Chloride: 103 mmol/L (ref 96–106)
Creatinine, Ser: 1.28 mg/dL — ABNORMAL HIGH (ref 0.57–1.00)
GFR calc Af Amer: 45 mL/min/{1.73_m2} — ABNORMAL LOW (ref >59)
GFR calc non Af Amer: 39 mL/min/{1.73_m2} — ABNORMAL LOW (ref >59)
Globulin, Total: 2.5 g/dL (ref 1.5–4.5)
Glucose: 147 mg/dL — ABNORMAL HIGH (ref 65–99)
Potassium: 4.8 mmol/L (ref 3.5–5.2)
Sodium: 139 mmol/L (ref 134–144)
Total Protein: 6 g/dL (ref 6.0–8.5)

## 2019-02-27 LAB — CBC WITH DIFFERENTIAL/PLATELET
Basophils Absolute: 0.1 10*3/uL (ref 0.0–0.2)
Basos: 1 %
EOS (ABSOLUTE): 0.2 10*3/uL (ref 0.0–0.4)
Eos: 3 %
Hematocrit: 41.1 % (ref 34.0–46.6)
Hemoglobin: 13.9 g/dL (ref 11.1–15.9)
Immature Grans (Abs): 0.1 10*3/uL (ref 0.0–0.1)
Immature Granulocytes: 1 %
Lymphocytes Absolute: 1.9 10*3/uL (ref 0.7–3.1)
Lymphs: 24 %
MCH: 30.4 pg (ref 26.6–33.0)
MCHC: 33.8 g/dL (ref 31.5–35.7)
MCV: 90 fL (ref 79–97)
Monocytes Absolute: 0.9 10*3/uL (ref 0.1–0.9)
Monocytes: 11 %
Neutrophils Absolute: 4.9 10*3/uL (ref 1.4–7.0)
Neutrophils: 60 %
Platelets: 186 10*3/uL (ref 150–450)
RBC: 4.57 x10E6/uL (ref 3.77–5.28)
RDW: 12.5 % (ref 11.7–15.4)
WBC: 8.1 10*3/uL (ref 3.4–10.8)

## 2019-02-27 LAB — LIPID PANEL
Chol/HDL Ratio: 4.3 ratio (ref 0.0–4.4)
Cholesterol, Total: 248 mg/dL — ABNORMAL HIGH (ref 100–199)
HDL: 58 mg/dL (ref >39)
LDL Chol Calc (NIH): 143 mg/dL — ABNORMAL HIGH (ref 0–99)
Triglycerides: 259 mg/dL — ABNORMAL HIGH (ref 0–149)
VLDL Cholesterol Cal: 47 mg/dL — ABNORMAL HIGH (ref 5–40)

## 2019-02-27 LAB — THYROID PANEL WITH TSH
Free Thyroxine Index: 2.3 (ref 1.2–4.9)
T3 Uptake Ratio: 27 % (ref 24–39)
T4, Total: 8.4 ug/dL (ref 4.5–12.0)
TSH: 4.68 u[IU]/mL — ABNORMAL HIGH (ref 0.450–4.500)

## 2019-02-27 LAB — VITAMIN D 25 HYDROXY (VIT D DEFICIENCY, FRACTURES): Vit D, 25-Hydroxy: 42.8 ng/mL (ref 30.0–100.0)

## 2019-03-01 ENCOUNTER — Other Ambulatory Visit: Payer: Self-pay

## 2019-03-02 ENCOUNTER — Ambulatory Visit (INDEPENDENT_AMBULATORY_CARE_PROVIDER_SITE_OTHER): Payer: Medicare Other | Admitting: Family Medicine

## 2019-03-02 ENCOUNTER — Other Ambulatory Visit: Payer: Self-pay

## 2019-03-02 ENCOUNTER — Encounter: Payer: Self-pay | Admitting: Family Medicine

## 2019-03-02 VITALS — BP 105/67 | HR 57 | Temp 99.0°F | Resp 20 | Ht 63.0 in | Wt 167.0 lb

## 2019-03-02 DIAGNOSIS — E1122 Type 2 diabetes mellitus with diabetic chronic kidney disease: Secondary | ICD-10-CM | POA: Diagnosis not present

## 2019-03-02 DIAGNOSIS — Z23 Encounter for immunization: Secondary | ICD-10-CM | POA: Diagnosis not present

## 2019-03-02 DIAGNOSIS — F411 Generalized anxiety disorder: Secondary | ICD-10-CM

## 2019-03-02 DIAGNOSIS — E1169 Type 2 diabetes mellitus with other specified complication: Secondary | ICD-10-CM

## 2019-03-02 DIAGNOSIS — F5101 Primary insomnia: Secondary | ICD-10-CM

## 2019-03-02 DIAGNOSIS — Z79899 Other long term (current) drug therapy: Secondary | ICD-10-CM | POA: Diagnosis not present

## 2019-03-02 DIAGNOSIS — E559 Vitamin D deficiency, unspecified: Secondary | ICD-10-CM

## 2019-03-02 DIAGNOSIS — E1121 Type 2 diabetes mellitus with diabetic nephropathy: Secondary | ICD-10-CM | POA: Diagnosis not present

## 2019-03-02 DIAGNOSIS — N183 Chronic kidney disease, stage 3 unspecified: Secondary | ICD-10-CM | POA: Diagnosis not present

## 2019-03-02 DIAGNOSIS — E034 Atrophy of thyroid (acquired): Secondary | ICD-10-CM

## 2019-03-02 DIAGNOSIS — I10 Essential (primary) hypertension: Secondary | ICD-10-CM

## 2019-03-02 DIAGNOSIS — E785 Hyperlipidemia, unspecified: Secondary | ICD-10-CM

## 2019-03-02 DIAGNOSIS — K219 Gastro-esophageal reflux disease without esophagitis: Secondary | ICD-10-CM

## 2019-03-02 DIAGNOSIS — E1159 Type 2 diabetes mellitus with other circulatory complications: Secondary | ICD-10-CM

## 2019-03-02 MED ORDER — ALPRAZOLAM 0.25 MG PO TABS: 0.2500 mg | ORAL_TABLET | Freq: Two times a day (BID) | ORAL | 1 refills | Status: DC | PRN

## 2019-03-02 MED ORDER — TRAZODONE HCL 50 MG PO TABS: 25.0000 mg | ORAL_TABLET | Freq: Every evening | ORAL | 3 refills | Status: DC | PRN

## 2019-03-02 MED ORDER — LEVOTHYROXINE SODIUM 112 MCG PO TABS: 112.0000 ug | ORAL_TABLET | Freq: Every day | ORAL | 3 refills | Status: DC

## 2019-03-02 NOTE — Patient Instructions (Signed)

## 2019-03-02 NOTE — Patient Outreach (Signed)
[facility]) Care Management  03/02/2019  Lisa Sutton [birth year] [id number]   Medication Adherence call to Mrs. Lisa Sutton [employer] spoke with patient she is past due on Rosuvastatin 5 mg patient explain when she started taking this medication she was only taking 1/2 tablet to see if she would tolerate it but, now she is taking a full tablet patient explain she has plenty at this time and will order when due. Lisa Sutton is showing past due under [place].   [name]

## 2019-03-02 NOTE — Progress Notes (Signed)
Subjective:  Patient ID: Lisa Sutton, female    DOB: 06-14-1933, 85 y.o.   MRN: [id number]  Patient Care Team: Baruch Gouty, FNP as PCP - General (Family Medicine) Rogene Houston, MD as Consulting Physician (Gastroenterology) Marybelle Killings, MD as Consulting Physician (Orthopedic Surgery) Fran Lowes, MD as Consulting Physician (Nephrology) Lavonna Monarch, MD as Consulting Physician (Dermatology) Clent Jacks, MD as Consulting Physician (Ophthalmology) Philemon Kingdom, MD as Consulting Physician (Internal Medicine)   Chief Complaint:  Medical Management of Chronic Issues (4 mo (moore)), Hyperlipidemia, Hypertension, and Diabetes   HPI: Lisa Sutton is a 85 y.o. female presenting on 03/02/2019 for Medical Management of Chronic Issues (4 mo (moore)), Hyperlipidemia, Hypertension, and Diabetes   1. Type 2 diabetes mellitus with diabetic nephropathy, without long-term current use of insulin (HCC) Pt presents for follow up evaluation of Type 2 diabetes mellitus.  Current symptoms include paresthesia of the feet and have been stable. Symptoms have been present for several months. Patient denies foot ulcerations, hyperglycemia, hypoglycemia , increased appetite, nausea, polydipsia, polyuria, visual disturbances, vomiting and weight loss.  Current diabetic medications include glipizide.  Compliant with meds - Yes  Known diabetic complications: nephropathy, peripheral neuropathy and cardiovascular disease Cardiovascular risk factors: advanced age (older than 18 for men, 31 for women), diabetes mellitus, dyslipidemia, hypertension and obesity (BMI >= 30 kg/m2) Eye exam current (within one year): yes Podiatry yearly?  Yes Weight trend: stable Prior visit with CDE: yes Current diet: in general, a "healthy" diet   Current exercise: walking  PNA Vaccine UTD?  Yes Hep B Vaccine?  Yes Tdap Vaccine UTD?  Yes Urine microalbumin UTD? No, ordered today  Is She on ACE  inhibitor or angiotensin II receptor blocker?  Yes, Irbesartan Is She on statin? Yes Crestor Is She on ASA 81 mg daily?  No    2. Gastroesophageal reflux disease without esophagitis Compliant with medications - Yes Current medications - Protonix Adverse side effects - No DEXA if on PPI -2018, pt declined this year Cough - No Sore throat - No Voice change - No Hemoptysis - No Dysphagia or dyspepsia - No Water brash - No Red Flags (weight loss, hematochezia, melena, weight loss, early satiety, fevers, odynophagia, or persistent vomiting) - No   3. Hyperlipidemia associated with type 2 diabetes mellitus ([place]) Compliant with medications - Yes Current medications - Crestor Side effects from medications - No Diet - generally healthy Exercise - walks and active daily, has not been to Gym due to COVID-19 pandemic  Lab Results  Component Value Date   CHOL 248 (H) 02/26/2019   HDL 58 02/26/2019   LDLCALC 143 (H) 02/26/2019   LDLDIRECT 99 11/01/2016   TRIG 259 (H) 02/26/2019   CHOLHDL 4.3 02/26/2019     Family and personal medical history reviewed. Smoking and ETOH history reviewed.    4. Hypertension associated with type 2 diabetes mellitus ([place]) Complaint with meds - Yes Current Medications - Norvasc, Atenolol, Avapro Checking BP at home ranging 120/80 Exercising Regularly - No Watching Salt intake - Yes Pertinent ROS:  Headache - No Fatigue - No Visual Disturbances - No Chest pain - No Dyspnea - No Palpitations - No LE edema - No They report good compliance with medications and can restate their regimen by memory. No medication side effects.  Family, social, and smoking history reviewed.   BP Readings from Last 3 Encounters:  03/02/19 105/67  01/15/19 106/67  12/14/18 118/72   CMP  Latest Ref Rng & Units 02/26/2019 02/13/2019 01/09/2019  Glucose 65 - 99 mg/dL 147(H) - -  BUN 8 - 27 mg/dL 28(H) - -  Creatinine 0.57 - 1.00 mg/dL 1.28(H) - -  Sodium 134 - 144  mmol/L 139 - -  Potassium 3.5 - 5.2 mmol/L 4.8 - -  Chloride 96 - 106 mmol/L 103 - -  CO2 20 - 29 mmol/L 22 - -  Calcium 8.7 - 10.3 mg/dL 8.6(L) - -  Total Protein 6.0 - 8.5 g/dL 6.0 6.3 6.3  Total Bilirubin 0.0 - 1.2 mg/dL 0.3 0.3 0.4  Alkaline Phos 39 - 117 IU/L 153(H) - -  AST 0 - 40 IU/L 26 20 25   ALT 0 - 32 IU/L 23 17 28       5. CKD stage 3 due to type 2 diabetes mellitus (HCC) No decreased urination or anuria. No swelling, confusion, weakness, or fatigue. No shortness of breath, PND, or orthopnea.   6. Hypothyroidism due to acquired atrophy of thyroid Compliant with medications - Yes Current medications - Synthroid alternating 112 mcg and 125 mcg Adverse side effects - No Weight - stable  Bowel habit changes - No Heat or cold intolerance - No Mood changes - No Changes in sleep habits - No Fatigue - No Skin, hair, or nail changes - No Tremor - No Palpitations - No Edema - No Shortness of breath - No  Lab Results  Component Value Date   TSH 4.680 (H) 02/26/2019     7. Vitamin D deficiency Pt is taking oral repletion therapy. Denies bone pain and tenderness, muscle weakness, fracture, and difficulty walking. Lab Results  Component Value Date   VD25OH 42.8 02/26/2019   VD25OH 47.0 11/09/2018   VD25OH 42.1 09/27/2018   Lab Results  Component Value Date   CALCIUM 8.6 (L) 02/26/2019      8. Controlled substance agreement signed Updated contract today for Xanax. Has been on for several years for anxiety and insomnia.   9. GAD (generalized anxiety disorder) Has been taking Xanax as needed for anxiety. Has never tried any other medications. Only takes nightly as needed.  GAD 7 : Generalized Anxiety Score 03/02/2019  Nervous, Anxious, on Edge 0  Control/stop worrying 0  Worry too much - different things 0  Trouble relaxing 0  Restless 0  Easily annoyed or irritable 0  Afraid - awful might happen 0  Total GAD 7 Score 0    10. Primary insomnia Has been  taking Xanax at night for several years to facilitate sleep. States she does not take on a nightly basis but does need it several times per week for sleep.      Relevant past medical, surgical, family, and social history reviewed and updated as indicated.  Allergies and medications reviewed and updated. Date reviewed: Chart in Epic.   Past Medical History:  Diagnosis Date  . Anxiety   . Arthritis   . Cataract   . Dysphagia    'sometimes but not a major issue' been checked out by GI (per pt)  . Dysrhythmia    'heart used to skip but doesn't anymore' was checked out by Dr. Ron Parker late '90s, everything checked out ok and not had any skipping since (all per pt)  . Essential hypertension, benign   . Fatty liver   . GERD (gastroesophageal reflux disease)   . H/O hypercalcemia 04/12/2017  . Hepatitis B    had at age 85, 'GI doc said it's gone away'  .  Hepatitis B surface antigen positive   . History of hiatal hernia   . Hypercalcemia   . Hypothyroidism   . Kidney stone August 2014   Patient was seen at [facility]  . Obesity   . Osteoporosis   . PONV (postoperative nausea and vomiting)   . Type 2 diabetes mellitus ([place])     Past Surgical History:  Procedure Laterality Date  . ABDOMINAL HYSTERECTOMY    . BILATERAL CARPAL TUNNEL RELEASE     Dr. Marlou Sa at surgical center  . BIOPSY  05/26/2017   Procedure: BIOPSY;  Surgeon: Rogene Houston, MD;  [facility];  Service: Endoscopy;;  colon  . BIOPSY  10/11/2018   Procedure: BIOPSY;  Surgeon: Rogene Houston, MD;  Location: [facility];  Service: Endoscopy;;  . CATARACT EXTRACTION, BILATERAL    . COLONOSCOPY N/A 05/26/2017   Procedure: COLONOSCOPY;  Surgeon: Rogene Houston, MD;  Location: [facility];  Service: Endoscopy;  Laterality: N/A;  10:30  . Deviated septum repair    . ESOPHAGOGASTRODUODENOSCOPY N/A 07/28/2016   Procedure: ESOPHAGOGASTRODUODENOSCOPY (EGD);  Surgeon: Rogene Houston, MD;  Location: [facility];   Service: Endoscopy;  Laterality: N/A;  300 - moved to 3/7 @ 1:00  . EYE SURGERY    . FLEXIBLE SIGMOIDOSCOPY N/A 10/11/2018   Procedure: FLEXIBLE SIGMOIDOSCOPY;  Surgeon: Rogene Houston, MD;  Location: [facility];  Service: Endoscopy;  Laterality: N/A;  unable to complete colonoscopy   . KNEE ARTHROPLASTY Left 09/15/2015   Procedure: COMPUTER ASSISTED TOTAL KNEE ARTHROPLASTY;  Surgeon: Marybelle Killings, MD;  Location: [facility];  Service: Orthopedics;  Laterality: Left;  . KNEE ARTHROSCOPY     left knee 2003    Social History   Socioeconomic History  . Marital status: Widowed    Spouse name: Not on file  . Number of children: Not on file  . Years of education: Not on file  . Highest education level: Not on file  Occupational History  . Not on file  Social Needs  . Financial resource strain: Not on file  . Food insecurity    Worry: Not on file    Inability: Not on file  . Transportation needs    Medical: Not on file    Non-medical: Not on file  Tobacco Use  . Smoking status: Never Smoker  . Smokeless tobacco: Never Used  Substance and Sexual Activity  . Alcohol use: No    Alcohol/week: 0.0 standard drinks  . Drug use: No  . Sexual activity: Never  Lifestyle  . Physical activity    Days per week: Not on file    Minutes per session: Not on file  . Stress: Not on file  Relationships  . Social Herbalist on phone: Not on file    Gets together: Not on file    Attends religious service: Not on file    Active member of club or organization: Not on file    Attends meetings of clubs or organizations: Not on file    Relationship status: Not on file  . Intimate partner violence    Fear of current or ex partner: Not on file    Emotionally abused: Not on file    Physically abused: Not on file    Forced sexual activity: Not on file  Other Topics Concern  . Not on file  Social History Narrative  . Not on file    Outpatient Encounter Medications as  of 03/02/2019   Medication Sig  . ACCU-CHEK AVIVA PLUS test strip CHECK BLOOD SUGAR TWO TIMES DAILY  . acetaminophen (TYLENOL) 500 MG tablet Take 1,000 mg by mouth daily as needed for moderate pain or headache.  . Alpha-Lipoic Acid 600 MG TABS Take 600 mg by mouth 2 (two) times a day.  . ALPRAZolam (XANAX) 0.25 MG tablet Take 1 tablet (0.25 mg total) by mouth 2 (two) times daily as needed for anxiety.  Marland Kitchen amLODipine (NORVASC) 5 MG tablet TAKE 1 TABLET BY MOUTH  DAILY  . atenolol (TENORMIN) 50 MG tablet Take 1 tablet (50 mg total) by mouth daily.  Marland Kitchen b complex vitamins tablet Take 1 tablet by mouth daily.  . Cholecalciferol (VITAMIN D3) 1000 units CAPS Take 1,000 Units by mouth daily.   . DULoxetine (CYMBALTA) 30 MG capsule Take 1 capsule (30 mg total) by mouth daily. (Patient taking differently: Take 30 mg by mouth every other day. )  . glipiZIDE (GLUCOTROL) 5 MG tablet Take 1/2 tablet (2.5 mg total) by mouth daily before supper.  . irbesartan (AVAPRO) 150 MG tablet Take 1 tablet (150 mg total) by mouth daily.  Marland Kitchen levothyroxine (SYNTHROID) 112 MCG tablet Take 1 tablet (112 mcg total) by mouth daily before breakfast.  . levothyroxine (SYNTHROID, LEVOTHROID) 25 MCG tablet TAKE 1 TABLET BY MOUTH  DAILY BEFORE BREAKFAST on MON, WED and FRI only (Patient taking differently: Take 25 mcg by mouth See admin instructions. TAKE BY MOUTH  DAILY BEFORE BREAKFAST  137 mcg Mon Wed and Fri (takes 112 and 25 mcg))  . Multiple Vitamin (MULTIVITAMIN WITH MINERALS) TABS tablet Take 1 tablet by mouth daily.  . pantoprazole (PROTONIX) 20 MG tablet Take 1 tablet (20 mg total) by mouth daily before breakfast.  . psyllium (METAMUCIL SMOOTH TEXTURE) 58.6 % powder Take 1 packet by mouth daily.  . rosuvastatin (CRESTOR) 5 MG tablet Take 1 tablet (5 mg total) by mouth daily. (Patient taking differently: Take 2.5 mg by mouth daily. )  . [DISCONTINUED] ALPRAZolam (XANAX) 0.25 MG tablet Take 1 tablet (0.25 mg total) by mouth 2 (two) times daily  as needed for anxiety.  . [DISCONTINUED] levothyroxine (SYNTHROID) 112 MCG tablet TAKE 1 TABLET BY MOUTH  DAILY ( TAKE WITH 25MCG 3  TIMES WEEKLY ) AS DIRECTED  . loperamide (IMODIUM) 2 MG capsule Take 1 capsule (2 mg total) by mouth daily after breakfast. (Patient not taking: Reported on 03/02/2019)  . traZODone (DESYREL) 50 MG tablet Take 0.5-1 tablets (25-50 mg total) by mouth at bedtime as needed for sleep.  . [DISCONTINUED] calcium citrate-vitamin D 200-200 MG-UNIT TABS Take 1 tablet by mouth daily.     No facility-administered encounter medications on file as of 03/02/2019.     Allergies  Allergen Reactions  . Statins Other (See Comments)    'discomfort, aching everywhere' Tolerates to livalo   . Zetia [Ezetimibe] Other (See Comments)    Leg pain  . Zocor [Simvastatin] Other (See Comments)    Leg pain    Review of Systems  Constitutional: Negative for activity change, appetite change, chills, diaphoresis, fatigue, fever and unexpected weight change.  HENT: Negative.   Eyes: Negative.  Negative for photophobia and visual disturbance.  Respiratory: Negative for cough, chest tightness and shortness of breath.   Cardiovascular: Negative for chest pain, palpitations and leg swelling.  Gastrointestinal: Negative for abdominal pain, blood in stool, constipation, diarrhea, nausea and vomiting.  Endocrine: Negative.  Negative for polydipsia, polyphagia and polyuria.  Genitourinary: Negative  for decreased urine volume, difficulty urinating, dysuria, frequency and urgency.  Musculoskeletal: Negative for arthralgias and myalgias.  Skin: Negative.   Allergic/Immunologic: Negative.   Neurological: Negative for dizziness, tremors, seizures, syncope, facial asymmetry, speech difficulty, weakness, light-headedness, numbness and headaches.       Tingling of feet at night.   Hematological: Negative.  Does not bruise/bleed easily.  Psychiatric/Behavioral: Negative for confusion, hallucinations,  sleep disturbance and suicidal ideas.  All other systems reviewed and are negative.       Objective:  BP 105/67   Pulse (!) 57   Temp 99 F (37.2 C)   Resp 20   Ht 5' 3"  (1.6 m)   Wt 167 lb (75.8 kg)   SpO2 99%   BMI 29.58 kg/m    Wt Readings from Last 3 Encounters:  03/02/19 167 lb (75.8 kg)  01/15/19 165 lb (74.8 kg)  12/14/18 166 lb (75.3 kg)    Physical Exam Vitals signs and nursing note reviewed.  Constitutional:      General: She is not in acute distress.    Appearance: Normal appearance. She is well-developed and well-groomed. She is obese. She is not ill-appearing, toxic-appearing or diaphoretic.  HENT:     Head: Normocephalic and atraumatic.     Jaw: There is normal jaw occlusion.     Right Ear: Hearing normal.     Left Ear: Hearing normal.     Nose: Nose normal.     Mouth/Throat:     Lips: Pink.     Mouth: Mucous membranes are moist.     Pharynx: Oropharynx is clear. Uvula midline.  Eyes:     General: Lids are normal.     Extraocular Movements: Extraocular movements intact.     Conjunctiva/sclera: Conjunctivae normal.     Pupils: Pupils are equal, round, and reactive to light.  Neck:     Musculoskeletal: Normal range of motion and neck supple.     Thyroid: No thyroid mass, thyromegaly or thyroid tenderness.     Vascular: No carotid bruit or JVD.     Trachea: Trachea and phonation normal.  Cardiovascular:     Rate and Rhythm: Normal rate and regular rhythm.     Chest Wall: PMI is not displaced.     Pulses: Normal pulses.     Heart sounds: Normal heart sounds. No murmur. No friction rub. No gallop.   Pulmonary:     Effort: Pulmonary effort is normal. No respiratory distress.     Breath sounds: Normal breath sounds. No wheezing.  Abdominal:     General: Bowel sounds are normal. There is no distension or abdominal bruit.     Palpations: Abdomen is soft. There is no hepatomegaly or splenomegaly.     Tenderness: There is no abdominal tenderness. There  is no right CVA tenderness or left CVA tenderness.     Hernia: No hernia is present.  Musculoskeletal: Normal range of motion.     Right lower leg: No edema.     Left lower leg: No edema.  Lymphadenopathy:     Cervical: No cervical adenopathy.  Skin:    General: Skin is warm and dry.     Capillary Refill: Capillary refill takes less than 2 seconds.     Coloration: Skin is not cyanotic, jaundiced or pale.     Findings: No rash.  Neurological:     General: No focal deficit present.     Mental Status: She is alert and oriented to person, place, and time.  Cranial Nerves: Cranial nerves are intact. No cranial nerve deficit.     Sensory: Sensation is intact. No sensory deficit.     Motor: Motor function is intact. No weakness.     Coordination: Coordination is intact. Coordination normal.     Gait: Gait is intact. Gait normal.     Deep Tendon Reflexes: Reflexes are normal and symmetric. Reflexes normal.  Psychiatric:        Attention and Perception: Attention and perception normal.        Mood and Affect: Mood and affect normal.        Speech: Speech normal.        Behavior: Behavior normal. Behavior is cooperative.        Thought Content: Thought content normal.        Cognition and Memory: Cognition and memory normal.        Judgment: Judgment normal.     Results for orders placed or performed in visit on 02/26/19  Bayer DCA Hb A1c Waived  Result Value Ref Range   HB A1C (BAYER DCA - WAIVED) 6.1 <7.0 %  VITAMIN D 25 Hydroxy (Vit-D Deficiency, Fractures)  Result Value Ref Range   Vit D, 25-Hydroxy 42.8 30.0 - 100.0 ng/mL  Thyroid Panel With TSH  Result Value Ref Range   TSH 4.680 (H) 0.450 - 4.500 uIU/mL   T4, Total 8.4 4.5 - 12.0 ug/dL   T3 Uptake Ratio 27 24 - 39 %   Free Thyroxine Index 2.3 1.2 - 4.9  Lipid panel  Result Value Ref Range   Cholesterol, Total 248 (H) 100 - 199 mg/dL   Triglycerides 259 (H) 0 - 149 mg/dL   HDL 58 >39 mg/dL   VLDL Cholesterol Cal 47  (H) 5 - 40 mg/dL   LDL Chol Calc (NIH) 143 (H) 0 - 99 mg/dL   Chol/HDL Ratio 4.3 0.0 - 4.4 ratio  CMP14+EGFR  Result Value Ref Range   Glucose 147 (H) 65 - 99 mg/dL   BUN 28 (H) 8 - 27 mg/dL   Creatinine, Ser 1.28 (H) 0.57 - 1.00 mg/dL   GFR calc non Af Amer 39 (L) >59 mL/min/1.73   GFR calc Af Amer 45 (L) >59 mL/min/1.73   BUN/Creatinine Ratio 22 12 - 28   Sodium 139 134 - 144 mmol/L   Potassium 4.8 3.5 - 5.2 mmol/L   Chloride 103 96 - 106 mmol/L   CO2 22 20 - 29 mmol/L   Calcium 8.6 (L) 8.7 - 10.3 mg/dL   Total Protein 6.0 6.0 - 8.5 g/dL   Albumin 3.5 (L) 3.6 - 4.6 g/dL   Globulin, Total 2.5 1.5 - 4.5 g/dL   Albumin/Globulin Ratio 1.4 1.2 - 2.2   Bilirubin Total 0.3 0.0 - 1.2 mg/dL   Alkaline Phosphatase 153 (H) 39 - 117 IU/L   AST 26 0 - 40 IU/L   ALT 23 0 - 32 IU/L  CBC with Differential/Platelet  Result Value Ref Range   WBC 8.1 3.4 - 10.8 x10E3/uL   RBC 4.57 3.77 - 5.28 x10E6/uL   Hemoglobin 13.9 11.1 - 15.9 g/dL   Hematocrit 41.1 34.0 - 46.6 %   MCV 90 79 - 97 fL   MCH 30.4 26.6 - 33.0 pg   MCHC 33.8 31.5 - 35.7 g/dL   RDW 12.5 11.7 - 15.4 %   Platelets 186 150 - 450 x10E3/uL   Neutrophils 60 Not Estab. %   Lymphs 24 Not Estab. %   Monocytes  11 Not Estab. %   Eos 3 Not Estab. %   Basos 1 Not Estab. %   Neutrophils Absolute 4.9 1.4 - 7.0 x10E3/uL   Lymphocytes Absolute 1.9 0.7 - 3.1 x10E3/uL   Monocytes Absolute 0.9 0.1 - 0.9 x10E3/uL   EOS (ABSOLUTE) 0.2 0.0 - 0.4 x10E3/uL   Basophils Absolute 0.1 0.0 - 0.2 x10E3/uL   Immature Granulocytes 1 Not Estab. %   Immature Grans (Abs) 0.1 0.0 - 0.1 x10E3/uL       Pertinent labs & imaging results that were available during my care of the patient were reviewed by me and considered in my medical decision making.  Assessment & Plan:  Estel was seen today for medical management of chronic issues, hyperlipidemia, hypertension and diabetes.  Diagnoses and all orders for this visit:  Type 2 diabetes mellitus with  diabetic nephropathy, without long-term current use of insulin (HCC) Lab Results  Component Value Date   HGBA1C 6.1 02/26/2019   HGBA1C 6.3 (H) 11/09/2018   HGBA1C 6.3 09/27/2018    Diabetes Control: well controlled. Does see endocrinology.  Instruction/counseling given: reminded to get eye exam, reminded to bring blood glucose meter & log to each visit, reminded to bring medications to each visit, discussed foot care, discussed the need for weight loss and discussed diet   1.  Rx changes: none 2.  Education: Reviewed 'ABCs' of diabetes management (respective goals in parentheses):  A1C (<7), blood pressure (<130/80), BMI (<25), and cholesterol (LDL <100). 3.  Discussed pathophysiology of DM; difference between type 1 and type 2 DM. 4.  CHO counting diet discussed.  Reviewed CHO amount in various foods and how to read nutrition labels.  Discussed recommended serving sizes.  5.  Recommend check BG several times a week and record readings to bring to next appointment. Report persistent high or low readings. 6.  Recommended increase physical activity - goal is 150 minutes per week and advance as tolerated. 7.  Adequate sleep, at least 6-8 hours per night.  8.  Smoking cessation.  9.  Follow up: 4 months  -     Microalbumin / creatinine urine ratio  Gastroesophageal reflux disease without esophagitis No red flags present. Diet discussed. Avoid fried, spicy, fatty, greasy, and acidic foods. Avoid caffeine, nicotine, and alcohol. Do not eat 2-3 hours before bedtime and stay upright for at least 1-2 hours after eating. Eat small frequent meals. Avoid NSAID's like motrin and aleve. Medications as prescribed. Report any new or worsening symptoms. Follow up as discussed or sooner if needed.    Hyperlipidemia associated with type 2 diabetes mellitus ([place]) Diet encouraged - increase intake of fresh fruits and vegetables, increase intake of lean proteins. Bake, broil, or grill foods. Avoid fried, greasy,  and fatty foods. Avoid fast foods. Increase intake of fiber-rich whole grains. Exercise encouraged - at least 150 minutes per week and advance as tolerated.  Goal BMI < 25. Continue medications as prescribed. Follow up in 3-6 months as discussed.   Hypertension associated with type 2 diabetes mellitus (HCC) BP well controlled. Changes were not made in regimen today. Goal BP is 130/80. Pt aware to report any persistent high or low readings. DASH diet and exercise encouraged. Exercise at least 150 minutes per week and increase as tolerated. Goal BMI > 25. Stress management encouraged. Avoid nicotine and tobacco product use. Avoid excessive alcohol and NSAID's. Avoid more than 2000 mg of sodium daily. Medications as prescribed. Follow up as scheduled.   CKD stage  3 due to type 2 diabetes mellitus (HCC) Stable. Report any new symptoms. Keep follow up as scheduled.   Hypothyroidism due to acquired atrophy of thyroid Thyroid disease has been well controlled.  Make sure to take medications on an empty stomach with a full glass of water. Make sure to avoid vitamins or supplements for at least 4 hours before and 4 hours after taking medications. Repeat labs in 3 months if adjustments are made and in 6 months if stable.   -     levothyroxine (SYNTHROID) 112 MCG tablet; Take 1 tablet (112 mcg total) by mouth daily before breakfast.  Vitamin D deficiency Continue repletion therapy. Eat foods rich in Vit D including milk, orange juice, yogurt with vitamin D added, salmon or mackerel, canned tuna fish, cereals with vitamin D added, and cod liver oil. Get out in the sun but make sure to wear at least SPF 30 sunscreen.   Controlled substance agreement signed -     ToxASSURE Select 13 (MW), Urine  GAD (generalized anxiety disorder) Pt wants to stop Xanax, discussed how to properly taper off. Pt aware to report any new or worsening symptoms.  -     ALPRAZolam (XANAX) 0.25 MG tablet; Take 1 tablet (0.25 mg total)  by mouth 2 (two) times daily as needed for anxiety. -     traZODone (DESYREL) 50 MG tablet; Take 0.5-1 tablets (25-50 mg total) by mouth at bedtime as needed for sleep.  Primary insomnia Pt would like to wean off of xanax and initiate something else for sleep. Pt will slowly taper off of Xanax, this was discussed in detail. Pt can start trazodone when needed for sleep.  -     ALPRAZolam (XANAX) 0.25 MG tablet; Take 1 tablet (0.25 mg total) by mouth 2 (two) times daily as needed for anxiety. -     traZODone (DESYREL) 50 MG tablet; Take 0.5-1 tablets (25-50 mg total) by mouth at bedtime as needed for sleep.  Need for immunization against influenza -     Flu Vaccine QUAD High Dose(Fluad)     Continue all other maintenance medications.  Follow up plan: Return in about 4 months (around 07/03/2019), or if symptoms worsen or fail to improve, for DM, Thyroid, CKD, HTN.  Continue healthy lifestyle choices, including diet (rich in fruits, vegetables, and lean proteins, and low in salt and simple carbohydrates) and exercise (at least 30 minutes of moderate physical activity daily).  Educational handout given for insomnia  The above assessment and management plan was discussed with the patient. The patient verbalized understanding of and has agreed to the management plan. Patient is aware to call the clinic if they develop any new symptoms or if symptoms persist or worsen. Patient is aware when to return to the clinic for a follow-up visit. Patient educated on when it is appropriate to go to the emergency department.   Monia Pouch, FNP-C [facility] [phone]

## 2019-03-05 ENCOUNTER — Ambulatory Visit: Payer: Medicare Other | Admitting: Family Medicine

## 2019-03-05 ENCOUNTER — Encounter (INDEPENDENT_AMBULATORY_CARE_PROVIDER_SITE_OTHER): Payer: Self-pay

## 2019-03-06 ENCOUNTER — Ambulatory Visit (INDEPENDENT_AMBULATORY_CARE_PROVIDER_SITE_OTHER): Payer: Medicare Other | Admitting: Internal Medicine

## 2019-03-06 LAB — TOXASSURE SELECT 13 (MW), URINE

## 2019-03-13 ENCOUNTER — Ambulatory Visit (INDEPENDENT_AMBULATORY_CARE_PROVIDER_SITE_OTHER): Payer: Medicare Other | Admitting: Internal Medicine

## 2019-03-13 ENCOUNTER — Telehealth (INDEPENDENT_AMBULATORY_CARE_PROVIDER_SITE_OTHER): Payer: Self-pay | Admitting: Internal Medicine

## 2019-03-13 NOTE — Telephone Encounter (Signed)
LFTs from from 02/26/2019 reviewed. Alkaline phosphatase is mildly elevated at 153 but AST and ALT are normal. This abnormality may be related to her medications or diabetes Patient reassured. She is to have LFTs repeated in 3 months.

## 2019-03-26 ENCOUNTER — Encounter: Payer: Self-pay | Admitting: Family Medicine

## 2019-03-26 ENCOUNTER — Other Ambulatory Visit: Payer: Self-pay | Admitting: Family Medicine

## 2019-03-26 DIAGNOSIS — R42 Dizziness and giddiness: Secondary | ICD-10-CM

## 2019-03-26 MED ORDER — MECLIZINE HCL 12.5 MG PO TABS: 12.5000 mg | ORAL_TABLET | Freq: Three times a day (TID) | ORAL | 0 refills | Status: DC | PRN

## 2019-04-04 ENCOUNTER — Other Ambulatory Visit: Payer: Self-pay | Admitting: Family Medicine

## 2019-04-26 ENCOUNTER — Other Ambulatory Visit: Payer: Self-pay | Admitting: Family Medicine

## 2019-05-10 DIAGNOSIS — G5792 Unspecified mononeuropathy of left lower limb: Secondary | ICD-10-CM | POA: Diagnosis not present

## 2019-05-10 DIAGNOSIS — G5791 Unspecified mononeuropathy of right lower limb: Secondary | ICD-10-CM | POA: Diagnosis not present

## 2019-05-10 DIAGNOSIS — M79671 Pain in right foot: Secondary | ICD-10-CM | POA: Diagnosis not present

## 2019-05-11 ENCOUNTER — Other Ambulatory Visit: Payer: Self-pay | Admitting: Family Medicine

## 2019-05-15 ENCOUNTER — Other Ambulatory Visit: Payer: Self-pay | Admitting: Family Medicine

## 2019-05-15 DIAGNOSIS — Z961 Presence of intraocular lens: Secondary | ICD-10-CM | POA: Diagnosis not present

## 2019-05-15 DIAGNOSIS — H02402 Unspecified ptosis of left eyelid: Secondary | ICD-10-CM | POA: Diagnosis not present

## 2019-05-15 DIAGNOSIS — H04123 Dry eye syndrome of bilateral lacrimal glands: Secondary | ICD-10-CM | POA: Diagnosis not present

## 2019-05-15 DIAGNOSIS — H353131 Nonexudative age-related macular degeneration, bilateral, early dry stage: Secondary | ICD-10-CM | POA: Diagnosis not present

## 2019-05-15 DIAGNOSIS — F411 Generalized anxiety disorder: Secondary | ICD-10-CM

## 2019-05-15 DIAGNOSIS — F5101 Primary insomnia: Secondary | ICD-10-CM

## 2019-05-15 DIAGNOSIS — E119 Type 2 diabetes mellitus without complications: Secondary | ICD-10-CM | POA: Diagnosis not present

## 2019-05-21 DIAGNOSIS — Z961 Presence of intraocular lens: Secondary | ICD-10-CM | POA: Diagnosis not present

## 2019-05-21 DIAGNOSIS — G902 Horner's syndrome: Secondary | ICD-10-CM | POA: Diagnosis not present

## 2019-05-21 DIAGNOSIS — H02402 Unspecified ptosis of left eyelid: Secondary | ICD-10-CM | POA: Diagnosis not present

## 2019-05-22 ENCOUNTER — Other Ambulatory Visit: Payer: Self-pay | Admitting: Pharmacist Clinician (PhC)/ Clinical Pharmacy Specialist

## 2019-05-22 ENCOUNTER — Other Ambulatory Visit: Payer: Self-pay | Admitting: Family Medicine

## 2019-05-23 DIAGNOSIS — G902 Horner's syndrome: Secondary | ICD-10-CM | POA: Diagnosis not present

## 2019-05-23 DIAGNOSIS — H353131 Nonexudative age-related macular degeneration, bilateral, early dry stage: Secondary | ICD-10-CM | POA: Diagnosis not present

## 2019-05-23 DIAGNOSIS — H04123 Dry eye syndrome of bilateral lacrimal glands: Secondary | ICD-10-CM | POA: Diagnosis not present

## 2019-05-23 DIAGNOSIS — H02402 Unspecified ptosis of left eyelid: Secondary | ICD-10-CM | POA: Diagnosis not present

## 2019-05-23 DIAGNOSIS — E119 Type 2 diabetes mellitus without complications: Secondary | ICD-10-CM | POA: Diagnosis not present

## 2019-05-23 MED ORDER — DULOXETINE HCL 30 MG PO CPEP: 30.0000 mg | ORAL_CAPSULE | Freq: Every day | ORAL | 0 refills | Status: DC

## 2019-05-23 MED ORDER — ATENOLOL 50 MG PO TABS: 50.0000 mg | ORAL_TABLET | Freq: Every day | ORAL | 1 refills | Status: DC

## 2019-05-28 ENCOUNTER — Other Ambulatory Visit: Payer: Self-pay | Admitting: Optometry

## 2019-05-28 DIAGNOSIS — H02402 Unspecified ptosis of left eyelid: Secondary | ICD-10-CM

## 2019-05-28 DIAGNOSIS — H353131 Nonexudative age-related macular degeneration, bilateral, early dry stage: Secondary | ICD-10-CM | POA: Diagnosis not present

## 2019-05-28 DIAGNOSIS — E119 Type 2 diabetes mellitus without complications: Secondary | ICD-10-CM | POA: Diagnosis not present

## 2019-05-28 DIAGNOSIS — H05012 Cellulitis of left orbit: Secondary | ICD-10-CM | POA: Diagnosis not present

## 2019-05-28 DIAGNOSIS — G902 Horner's syndrome: Secondary | ICD-10-CM | POA: Diagnosis not present

## 2019-06-05 ENCOUNTER — Ambulatory Visit
Admission: RE | Admit: 2019-06-05 | Discharge: 2019-06-05 | Disposition: A | Discharge status: Home / Self Care | Payer: Medicare Other | Source: Ambulatory Visit | Attending: Optometry | Admitting: Optometry

## 2019-06-05 ENCOUNTER — Other Ambulatory Visit: Payer: Self-pay

## 2019-06-05 ENCOUNTER — Other Ambulatory Visit: Payer: Self-pay | Admitting: Optometry

## 2019-06-05 DIAGNOSIS — H02402 Unspecified ptosis of left eyelid: Secondary | ICD-10-CM

## 2019-06-06 DIAGNOSIS — H05012 Cellulitis of left orbit: Secondary | ICD-10-CM | POA: Diagnosis not present

## 2019-06-06 DIAGNOSIS — G902 Horner's syndrome: Secondary | ICD-10-CM | POA: Diagnosis not present

## 2019-06-06 DIAGNOSIS — H02402 Unspecified ptosis of left eyelid: Secondary | ICD-10-CM | POA: Diagnosis not present

## 2019-06-18 ENCOUNTER — Other Ambulatory Visit: Payer: Self-pay

## 2019-06-20 ENCOUNTER — Other Ambulatory Visit: Payer: Self-pay

## 2019-06-20 ENCOUNTER — Ambulatory Visit: Payer: Medicare Other | Admitting: Internal Medicine

## 2019-06-20 ENCOUNTER — Encounter: Payer: Self-pay | Admitting: Internal Medicine

## 2019-06-20 VITALS — BP 130/70 | HR 64 | Ht 62.5 in | Wt 164.0 lb

## 2019-06-20 DIAGNOSIS — E034 Atrophy of thyroid (acquired): Secondary | ICD-10-CM

## 2019-06-20 DIAGNOSIS — Z8639 Personal history of other endocrine, nutritional and metabolic disease: Secondary | ICD-10-CM | POA: Diagnosis not present

## 2019-06-20 DIAGNOSIS — E1121 Type 2 diabetes mellitus with diabetic nephropathy: Secondary | ICD-10-CM | POA: Diagnosis not present

## 2019-06-20 DIAGNOSIS — E782 Mixed hyperlipidemia: Secondary | ICD-10-CM

## 2019-06-20 LAB — POCT GLYCOSYLATED HEMOGLOBIN (HGB A1C): Hemoglobin A1C: 6.1 % — AB (ref 4.0–5.6)

## 2019-06-20 NOTE — Patient Instructions (Addendum)
Please continue: - Glipizide 2.5 mg before larger dinner if you have dessert. Take this 15-30 min before the meals.  Please come back for labs in 1 month.  Please continue Levothyroxine 112 mcg daily + 25 mcg MWF.  Take the thyroid hormone every day, with water, at least 30 minutes before breakfast, separated by at least 4 hours from: - acid reflux medications - calcium - iron - multivitamins  Please move Protonix to lunchtime.  Please return in 6 months.

## 2019-06-20 NOTE — Progress Notes (Signed)
Patient ID: Lisa Sutton, female   DOB: 1934/05/08, 86 y.o.   MRN: [id number]   This visit occurred during the SARS-CoV-2 public health emergency.  Safety protocols were in place, including screening questions prior to the visit, additional usage of staff PPE, and extensive cleaning of exam room while observing appropriate contact time as indicated for disinfecting solutions.   HPI: Lisa Sutton is a 86 y.o.-year-old female returning for follow-up for h/o hypercalcemia, DM2, dx in 2015, non-insulin-dependent, uncontrolled, with complications (CKD, peripheral neuropathy).  Last visit 6 months ago.  She has been on ABx for eye pbs. (droopy eyelid). Will also see neurology. This is improving.  Reviewed HbA1c levels: Lab Results  Component Value Date   HGBA1C 6.1 02/26/2019   HGBA1C 6.3 (H) 11/09/2018   HGBA1C 6.3 09/27/2018  02/15/2018: HbA1c 6.4% 10/07/2017: HbA1c 6.2% 06/10/2017: HbA1c 7.0% 01/2017: HbA1c 6.1%   She is on: - Glipizide 2.5 mg before a large dinner or before a dinner with dessert We tried Metformin ER 500 mg with dinner 05/2018 -she had diarrhea and she stopped She could not afford Januvia.  Pt checks her sugars twice a day per review of her log: - am: 111, 122-145 >> 112-139, 148 >> 128-153, 160 - 2h after b'fast: 141-252 >> 122-145 >> n/c - before lunch:   100-136 >> n/c >> 84 >> n/c >> 118 >> 110 - 2h after lunch:  n/c >> 140s, 170-180 >> n/c - before dinner: 109 >> 90-127 >> n/c - 2h after dinner: 101, 119-162, 182 >> 107-179, 185 >> 110, 121-184, 195 - bedtime: 89-126 >> 140-150 >> n/c - nighttime: n/c >> 129, 150 >> n/c Lowest sugar was 73 >> 101 >> 107 >> 110; she has hypoglycemia awareness in the 70s. Highest sugar was 200x1 >> 182 >> 185 >> 195.   Glucometer: Accu-Chek Aviva  Pt's meals are: - Breakfast: 2 boiled eggs + toast + tomato or croissants + am and egg - Lunch: [place] sandwich + lettuce + tomato + mayo - Dinner: green beans, slaw +  potatoes + meatloaf   - Snacks: crackers + PB Diet green tea. In the past, she was going to Silver sneakers 3 times a week, but not during the coronavirus pandemic.  -+ CKD-sees nephrology, last BUN/creatinine:  Lab Results  Component Value Date   BUN 28 (H) 02/26/2019   BUN 26 11/09/2018   CREATININE 1.28 (H) 02/26/2019   CREATININE 1.52 (H) 11/09/2018  On irbesartan.  -+ HL; last set of lipids: Lab Results  Component Value Date   CHOL 248 (H) 02/26/2019   HDL 58 02/26/2019   LDLCALC 143 (H) 02/26/2019   LDLDIRECT 99 11/01/2016   TRIG 259 (H) 02/26/2019   CHOLHDL 4.3 02/26/2019  In the past, she could not tolerate statins because of leg cramps.   PCP recommended Vascepa (given samples) but this was not affordable.  Her Crestor dose was recently increased from 2.5 mg daily to 5 mg daily. - last eye exam was on 10/26/2018: No DR -She has numbness, burning, and tingling in her feet.  Previously on Neurontin, which she stopped due to weight gain; now on B complex, alpha-lipoic acid, and Cymbalta.  Hypothyroidism: - On LT4 112 mcg daily + an extra 25 mcg Monday Wednesday Friday: - in am - fasting - at least 30 min from b'fast - no Ca, Fe, MVI - + PPIs in am - + on Biotin (B complex) - last dose yesterday  Latest TSH  was elevated: Lab Results  Component Value Date   TSH 4.680 (H) 02/26/2019   TSH 4.340 11/09/2018   TSH 1.740 06/19/2018   TSH 1.340 02/15/2018   TSH 1.400 10/07/2017   TSH 3.960 06/10/2017   TSH 2.580 01/27/2017   TSH 0.906 09/03/2016   TSH 0.911 04/21/2016   TSH 0.735 09/08/2015   TSH 0.374 (L) 07/30/2015   TSH 0.368 (L) 06/04/2015   TSH 0.208 (L) 05/27/2015   TSH 1.300 03/13/2015   TSH 1.417 11/26/2014   TSH 1.110 10/17/2014   TSH 1.830 09/03/2014   TSH 8.300 (H) 07/17/2014   TSH 11.380 (H) 06/19/2014   TSH 9.310 (H) 04/29/2014   She has a history of secondary (non-PTH mediated) hypercalcemia, due to HCTZ.  This normalized after stopping the  medication: Lab Results  Component Value Date   PTH 11 (L) 06/04/2015   PTH Comment 06/04/2015   PTH 11 (L) 05/29/2015   PTH Comment 05/29/2015   PTH 6 (L) 01/13/2015   PTH Comment 01/13/2015   PTH 12 (L) 12/02/2014   PTH 5 (L) 11/26/2014   CALCIUM 8.6 (L) 02/26/2019   CALCIUM 8.9 11/09/2018   CALCIUM 9.3 06/19/2018   CALCIUM 9.4 02/15/2018   CALCIUM 9.8 10/07/2017   CALCIUM 9.6 06/10/2017   CALCIUM 9.4 01/27/2017   CALCIUM 10.7 (H) 01/21/2017   CALCIUM 10.2 11/01/2016   CALCIUM 9.5 09/03/2016   CALCIUM 9.7 04/21/2016   CALCIUM 9.3 09/18/2015   CALCIUM 8.9 09/17/2015   CALCIUM 8.6 (L) 09/16/2015   CALCIUM 9.6 09/08/2015   CALCIUM 10.5 (H) 09/03/2015   CALCIUM 9.3 06/04/2015   CALCIUM CANCELED 05/29/2015   CALCIUM CANCELED 05/27/2015   CALCIUM 11.0 (H) 04/28/2015   Vitamin D levels were reviewed and they were normal recently: Lab Results  Component Value Date   VD25OH 42.8 02/26/2019   VD25OH 47.0 11/09/2018   VD25OH 42.1 09/27/2018   VD25OH 27.8 (L) 06/19/2018   VD25OH 33.4 02/15/2018   VD25OH 32.6 10/07/2017   VD25OH 38.7 06/10/2017   VD25OH 40.5 01/21/2017   VD25OH 32.9 09/03/2016   VD25OH 63.1 05/27/2015   She is on 1000 units vitamin D daily.  ROS: Constitutional: no weight gain/no weight loss, no fatigue, no subjective hyperthermia, no subjective hypothermia Eyes: no blurry vision, no xerophthalmia ENT: no sore throat, no nodules palpated in neck, no dysphagia, no odynophagia, no hoarseness Cardiovascular: no CP/no SOB/no palpitations/no leg swelling Respiratory: no cough/no SOB/no wheezing Gastrointestinal: no N/no V/no D/no C/no acid reflux Musculoskeletal: no muscle aches/no joint aches Skin: no rashes, no hair loss Neurological: no tremors/+ numbness/+ tingling/no dizziness  I reviewed pt's medications, allergies, PMH, social hx, family hx, and changes were documented in the history of present illness. Otherwise, unchanged from my initial visit  note.   Past Medical History:  Diagnosis Date  . Anxiety   . Arthritis   . Cataract   . Dysphagia    'sometimes but not a major issue' been checked out by GI (per pt)  . Dysrhythmia    'heart used to skip but doesn't anymore' was checked out by Dr. Ron Parker late '90s, everything checked out ok and not had any skipping since (all per pt)  . Essential hypertension, benign   . Fatty liver   . GERD (gastroesophageal reflux disease)   . H/O hypercalcemia 04/12/2017  . Hepatitis B    had at age 75, 'GI doc said it's gone away'  . Hepatitis B surface antigen positive   . History  of hiatal hernia   . Hypercalcemia   . Hypothyroidism   . Kidney stone August 2014   Patient was seen at [facility]  . Obesity   . Osteoporosis   . PONV (postoperative nausea and vomiting)   . Type 2 diabetes mellitus ([place])    Past Surgical History:  Procedure Laterality Date  . ABDOMINAL HYSTERECTOMY    . BILATERAL CARPAL TUNNEL RELEASE     Dr. Marlou Sa at surgical center  . BIOPSY  05/26/2017   Procedure: BIOPSY;  Surgeon: Rogene Houston, MD;  [facility];  Service: Endoscopy;;  colon  . BIOPSY  10/11/2018   Procedure: BIOPSY;  Surgeon: Rogene Houston, MD;  [facility];  Service: Endoscopy;;  . CATARACT EXTRACTION, BILATERAL    . COLONOSCOPY N/A 05/26/2017   Procedure: COLONOSCOPY;  Surgeon: Rogene Houston, MD;  [facility];  Service: Endoscopy;  Laterality: N/A;  10:30  . Deviated septum repair    . ESOPHAGOGASTRODUODENOSCOPY N/A 07/28/2016   Procedure: ESOPHAGOGASTRODUODENOSCOPY (EGD);  Surgeon: Rogene Houston, MD;  [facility];  Service: Endoscopy;  Laterality: N/A;  300 - moved to 3/7 @ 1:00  . EYE SURGERY    . FLEXIBLE SIGMOIDOSCOPY N/A 10/11/2018   Procedure: FLEXIBLE SIGMOIDOSCOPY;  Surgeon: Rogene Houston, MD;  [facility];  Service: Endoscopy;  Laterality: N/A;  unable to complete colonoscopy   . KNEE ARTHROPLASTY Left 09/15/2015   Procedure:  COMPUTER ASSISTED TOTAL KNEE ARTHROPLASTY;  Surgeon: Marybelle Killings, MD;  Location: [facility];  Service: Orthopedics;  Laterality: Left;  . KNEE ARTHROSCOPY     left knee 2003   Social History   Socioeconomic History  . Marital status: Widowed    Spouse name: Not on file  . Number of children: Not on file  . Years of education: Not on file  . Highest education level: Not on file  Occupational History  . Not on file  Tobacco Use  . Smoking status: Never Smoker  . Smokeless tobacco: Never Used  Substance and Sexual Activity  . Alcohol use: No    Alcohol/week: 0.0 standard drinks  . Drug use: No  . Sexual activity: Never  Other Topics Concern  . Not on file  Social History Narrative  . Not on file   Social Determinants of Health   Financial Resource Strain:   . Difficulty of Paying Living Expenses: Not on file  Food Insecurity:   . Worried About Charity fundraiser in the Last Year: Not on file  . Ran Out of Food in the Last Year: Not on file  Transportation Needs:   . Lack of Transportation (Medical): Not on file  . Lack of Transportation (Non-Medical): Not on file  Physical Activity:   . Days of Exercise per Week: Not on file  . Minutes of Exercise per Session: Not on file  Stress:   . Feeling of Stress : Not on file  Social Connections:   . Frequency of Communication with Friends and Family: Not on file  . Frequency of Social Gatherings with Friends and Family: Not on file  . Attends Religious Services: Not on file  . Active Member of Clubs or Organizations: Not on file  . Attends Archivist Meetings: Not on file  . Marital Status: Not on file  Intimate Partner Violence:   . Fear of Current or Ex-Partner: Not on file  . Emotionally Abused: Not on file  .  Physically Abused: Not on file  . Sexually Abused: Not on file   Current Outpatient Medications on File Prior to Visit  Medication Sig Dispense Refill  . acetaminophen (TYLENOL) 500 MG tablet Take 1,000 mg  by mouth daily as needed for moderate pain or headache.    . Alpha-Lipoic Acid 600 MG TABS Take 600 mg by mouth 2 (two) times a day.    . ALPRAZolam (XANAX) 0.25 MG tablet Take 1 tablet (0.25 mg total) by mouth 2 (two) times daily as needed for anxiety. 180 tablet 1  . amLODipine (NORVASC) 5 MG tablet TAKE 1 TABLET BY MOUTH  DAILY 90 tablet 1  . atenolol (TENORMIN) 50 MG tablet Take 1 tablet (50 mg total) by mouth daily. 90 tablet 1  . b complex vitamins tablet Take 1 tablet by mouth daily.    . Cholecalciferol (VITAMIN D3) 1000 units CAPS Take 1,000 Units by mouth daily.     . DULoxetine (CYMBALTA) 30 MG capsule Take 1 capsule (30 mg total) by mouth daily. 90 capsule 0  . glipiZIDE (GLUCOTROL) 5 MG tablet Take 1/2 tablet (2.5 mg total) by mouth daily before supper. 45 tablet 0  . glucose blood (ACCU-CHEK AVIVA PLUS) test strip Test BS 2 times daily Dx E11.21 200 strip 3  . irbesartan (AVAPRO) 150 MG tablet Take 1 tablet (150 mg total) by mouth daily. 90 tablet 3  . levothyroxine (SYNTHROID) 112 MCG tablet Take 1 tablet (112 mcg total) by mouth daily before breakfast. 90 tablet 3  . levothyroxine (SYNTHROID) 25 MCG tablet TAKE 1 TABLET BY MOUTH  DAILY BEFORE BREAKFAST ON  MONDAY, WEDNESDAY, AND  FRIDAY ONLY ( TAKE WITH  112MCG) AS DIRECTED 39 tablet 3  . loperamide (IMODIUM) 2 MG capsule Take 1 capsule (2 mg total) by mouth daily after breakfast. (Patient not taking: Reported on 03/02/2019)    . meclizine (ANTIVERT) 12.5 MG tablet Take 1 tablet (12.5 mg total) by mouth 3 (three) times daily as needed for dizziness. 30 tablet 0  . Multiple Vitamin (MULTIVITAMIN WITH MINERALS) TABS tablet Take 1 tablet by mouth daily.    . pantoprazole (PROTONIX) 20 MG tablet Take 1 tablet (20 mg total) by mouth daily before breakfast. 90 tablet 3  . psyllium (METAMUCIL SMOOTH TEXTURE) 58.6 % powder Take 1 packet by mouth daily. 283 g 12  . rosuvastatin (CRESTOR) 5 MG tablet TAKE 1 TABLET BY MOUTH  DAILY 90 tablet 1   . traZODone (DESYREL) 50 MG tablet TAKE 1/2 TO 1 TABLET BY  MOUTH AT BEDTIME AS NEEDED  FOR SLEEP 30 tablet 2  . [DISCONTINUED] calcium citrate-vitamin D 200-200 MG-UNIT TABS Take 1 tablet by mouth daily.       No current facility-administered medications on file prior to visit.   Allergies  Allergen Reactions  . Statins Other (See Comments)    'discomfort, aching everywhere' Tolerates to livalo   . Zetia [Ezetimibe] Other (See Comments)    Leg pain  . Zocor [Simvastatin] Other (See Comments)    Leg pain   Family History  Problem Relation Age of Onset  . Hypertension Father   . Hip fracture Father   . COPD Mother   . Cancer Maternal Aunt   . Diabetes Maternal Aunt   . Diabetes Maternal Uncle   . Colon cancer Neg Hx     PE: There were no vitals taken for this visit. Wt Readings from Last 3 Encounters:  03/02/19 167 lb (75.8 kg)  01/15/19 165  lb (74.8 kg)  12/14/18 166 lb (75.3 kg)   Constitutional: overweight, in NAD Eyes: PERRLA, EOMI, no exophthalmos ENT: moist mucous membranes, no thyromegaly, no cervical lymphadenopathy Cardiovascular: RRR, No MRG Respiratory: CTA B Gastrointestinal: abdomen soft, NT, ND, BS+ Musculoskeletal: no deformities, strength intact in all 4 Skin: moist, warm, no rashes Neurological: no tremor with outstretched hands, DTR normal in all 4  ASSESSMENT: 1. DM2, non-insulin-dependent, uncontrolled, with complications - CKD - PN  2. H/o Hypercalcemia  3. Hypothyroidism  4. HL  PLAN:  1. Patient with history of controlled diabetes, on low-dose glipizide only with larger meals.  In the past, she was on Metformin but we had to stop due to diarrhea.  At last visit, sugars were mostly at goal with only few, mild, hyperglycemic spikes.  We did not change the regimen at that time. -Latest HbA1c was checked 3 months ago and this was better, at 6.1%. -At this visit, sugars appear at or slightly above goal in the last 2 weeks (she does not bring  further records), both in am and after dinner.  Upon questioning, she is taking glipizide at the start of her meals and I advised her to discontinue 30 minutes earlier.  For now, we will not change her regimen otherwise - I suggested to:  Patient Instructions  Please continue: - Glipizide 2.5 mg before larger dinner if you have dessert. Take this 15-30 min before the meals.  Please come back for labs in 1 month.  Please continue Levothyroxine 112 mcg daily + 25 mcg MWF.  Take the thyroid hormone every day, with water, at least 30 minutes before breakfast, separated by at least 4 hours from: - acid reflux medications - calcium - iron - multivitamins  Please move Protonix to lunchtime.  Please return in 6 months.  - we checked her HbA1c: 6.1% (stable, excellent) - advised to check sugars at different times of the day - 1x a day, rotating check times - advised for yearly eye exams >> she is UTD - return to clinic in 6 months     2. H/o Hypercalcemia -Mild, resolved reviewed latest labs, with the latest calcium being slightly low: Lab Results  Component Value Date   CALCIUM 8.6 (L) 02/26/2019  -PTH was not elevated -Latest vitamin D level was normal 02/2019, on 1000 units vitamin D daily. -This was probably due to HCTZ.  She is now off the medication  3. Hypothyroidism -Previously managed by PCP -This is well controlled in the past but the latest TSH was high: Lab Results  Component Value Date   TSH 4.680 (H) 02/26/2019  -She is on 112 mcg LT4 + 25 mcg MWF -However, upon questioning, she is not taking levothyroxine correctly, but taking it along with Protonix.  We discussed about how to take it correctly we moved Protonix to lunchtime or later -We will recheck TFTs in 1 month and change the dose of LT4 accordingly.  I advised her to not take B complex in the day of the labs.  4. HL -Reviewed latest lipid panel: LDL above goal, as are her triglycerides Lab Results  Component  Value Date   CHOL 248 (H) 02/26/2019   HDL 58 02/26/2019   LDLCALC 143 (H) 02/26/2019   LDLDIRECT 99 11/01/2016   TRIG 259 (H) 02/26/2019   CHOLHDL 4.3 02/26/2019  -Intolerant to statins in the past but now on Crestor 5 mg daily, dose increased after the above results returned  Orders Placed This Encounter  Procedures  . TSH  . T4, free  . POCT glycosylated hemoglobin (Hb A1C)    Philemon Kingdom, MD PhD [facility] Endocrinology

## 2019-07-04 ENCOUNTER — Ambulatory Visit: Payer: Medicare Other | Admitting: Diagnostic Neuroimaging

## 2019-07-04 ENCOUNTER — Encounter: Payer: Self-pay | Admitting: Diagnostic Neuroimaging

## 2019-07-04 ENCOUNTER — Ambulatory Visit: Payer: Medicare Other | Admitting: Family Medicine

## 2019-07-04 ENCOUNTER — Other Ambulatory Visit: Payer: Self-pay

## 2019-07-04 VITALS — BP 146/77 | HR 64 | Temp 96.9°F | Ht 63.0 in | Wt 166.4 lb

## 2019-07-04 DIAGNOSIS — H02402 Unspecified ptosis of left eyelid: Secondary | ICD-10-CM | POA: Diagnosis not present

## 2019-07-04 NOTE — Patient Instructions (Signed)
LEFT EYE PTOSIS (likely related to orbital cellulitis; had major dental work in left maxillary region 1-2 weeks before onset of symptoms; now improving greatly after antibiotics; no evidence of horner's syndrome or myasthenia gravis at this time) - monitor symptoms; follow up with ophthalmology

## 2019-07-04 NOTE — Progress Notes (Signed)
[facility]  PATIENT: Lisa Sutton DOB: [birth year]  REFERRING CLINICIAN: Clent Jacks, MD HISTORY FROM: patient  REASON FOR VISIT: new consult    HISTORICAL  CHIEF COMPLAINT:  Chief Complaint  Patient presents with  . Left Ptosis    rm 7 New Pt "drooping left eyelid since Dec 2020; CT scan was normal: eye has improved"    HISTORY OF PRESENT ILLNESS:   86 year old female here for evaluation of left ptosis.  December 2020 patient had onset of left eyelid drooping.  Patient had had left maxillary tooth dental work 1 to 2 weeks prior to onset of symptoms.  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  No pupillary abnormalities were noted.  Patient followed up on 05/28/2019, now noted to have erythema and edema, now felt to be probable infection.  Patient was started on antibiotics.  Patient returned for another checkup on 06/06/2019 and symptoms had significantly improved.  Patient also had CT orbits which were unremarkable.  Since that time symptoms have severely improved.  She still notices mild left ptosis.  Swelling, redness and pain has significant improved.  No headaches, numbness, tingling, slurred speech or trouble talking.  No generalized muscle weakness.    REVIEW OF SYSTEMS: Full 14 system review of systems performed and negative with exception of: As per HPI.  ALLERGIES: Allergies  Allergen Reactions  . Statins Other (See Comments)    'discomfort, aching everywhere' Tolerates to livalo   . Zetia [Ezetimibe] Other (See Comments)    Leg pain  . Zocor [Simvastatin] Other (See Comments)    Leg pain    HOME MEDICATIONS: Outpatient Medications Prior to Visit  Medication Sig Dispense Refill  . acetaminophen (TYLENOL) 500 MG tablet Take 1,000 mg by mouth daily as needed for moderate pain or headache.    . Alpha-Lipoic Acid 600 MG TABS Take 600 mg by mouth 2 (two) times a day.     . ALPRAZolam (XANAX) 0.25 MG tablet Take 1 tablet (0.25 mg total) by mouth 2 (two) times daily as needed for anxiety. 180 tablet 1  . amLODipine (NORVASC) 5 MG tablet TAKE 1 TABLET BY MOUTH  DAILY 90 tablet 1  . Ascorbic Acid (VITAMIN C) 100 MG tablet Take 100 mg by mouth daily.    Marland Kitchen atenolol (TENORMIN) 50 MG tablet Take 1 tablet (50 mg total) by mouth daily. 90 tablet 1  . b complex vitamins tablet Take 1 tablet by mouth daily.    . Cholecalciferol (VITAMIN D3) 1000 units CAPS Take 1,000 Units by mouth daily.     . DULoxetine (CYMBALTA) 30 MG capsule Take 1 capsule (30 mg total) by mouth daily. 90 capsule 0  . glipiZIDE (GLUCOTROL) 5 MG tablet Take 1/2 tablet (2.5 mg total) by mouth daily before supper. 45 tablet 0  . glucose blood (ACCU-CHEK AVIVA PLUS) test strip Test BS 2 times daily Dx E11.21 200 strip 3  . irbesartan (AVAPRO) 150 MG tablet Take 1 tablet (150 mg total) by mouth daily. 90 tablet 3  . levothyroxine (SYNTHROID) 112 MCG tablet Take 1 tablet (112 mcg total) by mouth daily before breakfast. 90 tablet 3  . levothyroxine (SYNTHROID) 25 MCG tablet TAKE 1 TABLET BY MOUTH  DAILY BEFORE BREAKFAST ON  MONDAY, WEDNESDAY, AND  FRIDAY ONLY ( TAKE WITH  112MCG) AS DIRECTED 39 tablet 3  . loperamide (IMODIUM) 2 MG capsule Take 1 capsule (2 mg total) by mouth daily  after breakfast.    . meclizine (ANTIVERT) 12.5 MG tablet Take 1 tablet (12.5 mg total) by mouth 3 (three) times daily as needed for dizziness. 30 tablet 0  . Multiple Vitamin (MULTIVITAMIN WITH MINERALS) TABS tablet Take 1 tablet by mouth daily.    . pantoprazole (PROTONIX) 20 MG tablet Take 1 tablet (20 mg total) by mouth daily before breakfast. 90 tablet 3  . psyllium (METAMUCIL SMOOTH TEXTURE) 58.6 % powder Take 1 packet by mouth daily. 283 g 12  . rosuvastatin (CRESTOR) 5 MG tablet TAKE 1 TABLET BY MOUTH  DAILY 90 tablet 1  . traZODone (DESYREL) 50 MG tablet TAKE 1/2 TO 1 TABLET BY  MOUTH AT BEDTIME AS NEEDED  FOR SLEEP 30  tablet 2  . Zinc Sulfate (ZINC 15 PO) Take by mouth.     No facility-administered medications prior to visit.    PAST MEDICAL HISTORY: Past Medical History:  Diagnosis Date  . Anxiety   . Arthritis   . Cataract   . Dysphagia    'sometimes but not a major issue' been checked out by GI (per pt)  . Dysrhythmia    'heart used to skip but doesn't anymore' was checked out by Dr. Ron Parker late '90s, everything checked out ok and not had any skipping since (all per pt)  . Essential hypertension, benign   . Fatty liver   . GERD (gastroesophageal reflux disease)   . H/O hypercalcemia 04/12/2017  . Hepatitis B    had at age 63, 'GI doc said it's gone away'  . Hepatitis B surface antigen positive   . High cholesterol   . History of hiatal hernia   . Hypercalcemia   . Hypothyroidism   . Kidney stone August 2014   Patient was [facility] at [facility]  . Obesity   . Osteoporosis   . PONV (postoperative nausea and vomiting)   . Type 2 diabetes mellitus ([place])     PAST SURGICAL HISTORY: Past Surgical History:  Procedure Laterality Date  . ABDOMINAL HYSTERECTOMY    . BILATERAL CARPAL TUNNEL RELEASE     Dr. Marlou Sa at surgical center  . BIOPSY  05/26/2017   Procedure: BIOPSY;  Surgeon: Rogene Houston, MD;  [facility]: [facility];  Service: Endoscopy;;  colon  . BIOPSY  10/11/2018   Procedure: BIOPSY;  Surgeon: Rogene Houston, MD;  [facility];  Service: Endoscopy;;  . CATARACT EXTRACTION, BILATERAL    . COLONOSCOPY N/A 05/26/2017   Procedure: COLONOSCOPY;  Surgeon: Rogene Houston, MD;  [facility]: [facility];  Service: Endoscopy;  Laterality: N/A;  10:30  . Deviated septum repair    . ESOPHAGOGASTRODUODENOSCOPY N/A 07/28/2016   Procedure: ESOPHAGOGASTRODUODENOSCOPY (EGD);  Surgeon: Rogene Houston, MD;  [facility]: [facility];  Service: Endoscopy;  Laterality: N/A;  300 - moved to 3/7 @ 1:00  . EYE SURGERY    . FLEXIBLE SIGMOIDOSCOPY N/A 10/11/2018   Procedure: FLEXIBLE SIGMOIDOSCOPY;   Surgeon: Rogene Houston, MD;  Location: [facility];  Service: Endoscopy;  Laterality: N/A;  unable to complete colonoscopy   . KNEE ARTHROPLASTY Left 09/15/2015   Procedure: COMPUTER ASSISTED TOTAL KNEE ARTHROPLASTY;  Surgeon: Marybelle Killings, MD;  Location: [facility];  Service: Orthopedics;  Laterality: Left;  . KNEE ARTHROSCOPY     left knee 2003    FAMILY HISTORY: Family History  Problem Relation Age of Onset  . Hypertension Father   . Hip fracture Father   . COPD Mother   .  Heart attack Mother   . Cancer Maternal Aunt   . Diabetes Maternal Aunt   . Diabetes Maternal Uncle   . Colon cancer Neg Hx     SOCIAL HISTORY: Social History   Socioeconomic History  . Marital status: Widowed    Spouse name: Not on file  . Number of children: 1  . Years of education: Not on file  . Highest education level: High school graduate  Occupational History    Comment: retired  Tobacco Use  . Smoking status: Never Smoker  . Smokeless tobacco: Never Used  Substance and Sexual Activity  . Alcohol use: No    Alcohol/week: 0.0 standard drinks  . Drug use: No  . Sexual activity: Never  Other Topics Concern  . Not on file  Social History Narrative   07/04/19 Lives with son   Caffeine- coffee 1 c daily   Social Determinants of Health   Financial Resource Strain:   . Difficulty of Paying Living Expenses: Not on file  Food Insecurity:   . Worried About Charity fundraiser in the Last Year: Not on file  . Ran Out of Food in the Last Year: Not on file  Transportation Needs:   . Lack of Transportation (Medical): Not on file  . Lack of Transportation (Non-Medical): Not on file  Physical Activity:   . Days of Exercise per Week: Not on file  . Minutes of Exercise per Session: Not on file  Stress:   . Feeling of Stress : Not on file  Social Connections:   . Frequency of Communication with Friends and Family: Not on file  . Frequency of Social Gatherings with Friends and Family: Not on file    . Attends Religious Services: Not on file  . Active Member of Clubs or Organizations: Not on file  . Attends Archivist Meetings: Not on file  . Marital Status: Not on file  Intimate Partner Violence:   . Fear of Current or Ex-Partner: Not on file  . Emotionally Abused: Not on file  . Physically Abused: Not on file  . Sexually Abused: Not on file     PHYSICAL EXAM  GENERAL EXAM/CONSTITUTIONAL: Vitals:  Vitals:   07/04/19 0958  BP: (!) 146/77  Pulse: 64  Temp: (!) 96.9 F (36.1 C)  Weight: 166 lb 6.4 oz (75.5 kg)  Height: 5\' 3"  (1.6 m)   Body mass index is 29.48 kg/m. Wt Readings from Last 3 Encounters:  07/04/19 166 lb 6.4 oz (75.5 kg)  06/20/19 164 lb (74.4 kg)  03/02/19 167 lb (75.8 kg)    Patient is in no distress; well developed, nourished and groomed; neck is supple  CARDIOVASCULAR:  Examination of carotid arteries is normal; no carotid bruits  Regular rate and rhythm, no murmurs  Examination of peripheral vascular system by observation and palpation is normal  EYES:  Ophthalmoscopic exam of optic discs and posterior segments is normal; no papilledema or hemorrhages No exam data present  MUSCULOSKELETAL:  Gait, strength, tone, movements noted in Neurologic exam below  NEUROLOGIC: MENTAL STATUS:  MMSE - [place] Exam 09/27/2016 09/11/2015  Orientation to time 5 5  Orientation to Place 5 5  Registration 3 3  Attention/ Calculation 3 4  Recall 2 3  Language- name 2 objects 2 2  Language- repeat 1 1  Language- follow 3 step command 3 3  Language- read & follow direction 1 1  Write a sentence 1 1  Copy design 0  0  Total score 26 28    awake, alert, oriented to person, place and time  recent and remote memory intact  normal attention and concentration  language fluent, comprehension intact, naming intact  fund of knowledge appropriate  CRANIAL NERVE:   2nd - no papilledema on fundoscopic exam  2nd, 3rd, 4th, 6th -  pupils equal and reactive to light, visual fields full to confrontation, extraocular muscles intact, no nystagmus; MILD LEFT PTOSIS  5th - facial sensation symmetric  7th - facial strength symmetric  8th - hearing intact  9th - palate elevates symmetrically, uvula midline  11th - shoulder shrug symmetric  12th - tongue protrusion midline  MOTOR:   normal bulk and tone, full strength in the BUE, BLE  SENSORY:   normal and symmetric to light touch, temperature, vibration  COORDINATION:   finger-nose-finger, fine finger movements normal  REFLEXES:   deep tendon reflexes present and symmetric  GAIT/STATION:   narrow based gait     DIAGNOSTIC DATA (LABS, IMAGING, TESTING) - I reviewed patient records, labs, notes, testing and imaging myself where available.  Lab Results  Component Value Date   WBC 8.1 02/26/2019   HGB 13.9 02/26/2019   HCT 41.1 02/26/2019   MCV 90 02/26/2019   PLT 186 02/26/2019      Component Value Date/Time   NA 139 02/26/2019 0852   K 4.8 02/26/2019 0852   CL 103 02/26/2019 0852   CO2 22 02/26/2019 0852   GLUCOSE 147 (H) 02/26/2019 0852   GLUCOSE 115 (H) 09/18/2015 0526   BUN 28 (H) 02/26/2019 0852   CREATININE 1.28 (H) 02/26/2019 0852   CALCIUM 8.6 (L) 02/26/2019 0852   PROT 6.0 02/26/2019 0852   ALBUMIN 3.5 (L) 02/26/2019 0852   AST 26 02/26/2019 0852   ALT 23 02/26/2019 0852   ALKPHOS 153 (H) 02/26/2019 0852   BILITOT 0.3 02/26/2019 0852   GFRNONAA 39 (L) 02/26/2019 0852   GFRAA 45 (L) 02/26/2019 0852   Lab Results  Component Value Date   CHOL 248 (H) 02/26/2019   HDL 58 02/26/2019   LDLCALC 143 (H) 02/26/2019   LDLDIRECT 99 11/01/2016   TRIG 259 (H) 02/26/2019   CHOLHDL 4.3 02/26/2019   Lab Results  Component Value Date   HGBA1C 6.1 (A) 06/20/2019   Lab Results  Component Value Date   VITAMINB12 >2000 (H) 01/27/2017   Lab Results  Component Value Date   TSH 4.680 (H) 02/26/2019    06/05/19 CT orbits (without)    - No cause of ptosis on the left identified.    ASSESSMENT AND PLAN  86 y.o. year old female here with new onset left ptosis, followed by swelling, erythema and signs of infection, possible orbital cellulitis, which significantly improved following antibiotic course.  Symptoms began 1 to 2 weeks after left maxillary dental procedure.  Dx:  1. Ptosis, left     PLAN:  LEFT EYE PTOSIS (likely related to orbital cellulitis; had major dental work in left maxillary region 1-2 weeks before onset of symptoms; now improving greatly after antibiotics; no definite evidence of horner's syndrome or myasthenia gravis at this time) - monitor symptoms; follow up with ophthalmology  Return return to Dr. Katy Fitch, for pending if symptoms worsen or fail to improve.    Penni Bombard, MD 0/11/6224, 33:35 AM Certified in Neurology, Neurophysiology and Neuroimaging  [facility] [address] [place], [place] [address] [phone]

## 2019-07-05 ENCOUNTER — Encounter: Payer: Self-pay | Admitting: Family Medicine

## 2019-07-05 ENCOUNTER — Other Ambulatory Visit: Payer: Self-pay | Admitting: Family Medicine

## 2019-07-05 DIAGNOSIS — G5791 Unspecified mononeuropathy of right lower limb: Secondary | ICD-10-CM | POA: Diagnosis not present

## 2019-07-05 DIAGNOSIS — B3731 Acute candidiasis of vulva and vagina: Secondary | ICD-10-CM

## 2019-07-05 DIAGNOSIS — G5792 Unspecified mononeuropathy of left lower limb: Secondary | ICD-10-CM | POA: Diagnosis not present

## 2019-07-05 DIAGNOSIS — B373 Candidiasis of vulva and vagina: Secondary | ICD-10-CM

## 2019-07-05 MED ORDER — FLUCONAZOLE 150 MG PO TABS: 150.0000 mg | ORAL_TABLET | Freq: Once | ORAL | 0 refills | Status: AC

## 2019-07-10 ENCOUNTER — Telehealth: Payer: Self-pay | Admitting: Internal Medicine

## 2019-07-10 NOTE — Telephone Encounter (Signed)
Patient requests to have her labs drawn in [place] at her Primary Care Physician's office - Dr. Monia Pouch at [facility] in [place] and have her PCP send Dr. Cruzita Lederer a copy of the thyroid results. Patient requests to be called at ph# [phone] to be advised.

## 2019-07-10 NOTE — Telephone Encounter (Signed)
I am not sure, but she can definitely have her labs drawn there.

## 2019-07-10 NOTE — Telephone Encounter (Signed)
They will be the ones drawing so they bill for the draw. If it is then forwarded to [facility] or [facility] then they bill for the processing

## 2019-07-10 NOTE — Telephone Encounter (Signed)
Please advise 

## 2019-07-13 ENCOUNTER — Other Ambulatory Visit: Payer: Self-pay

## 2019-07-16 ENCOUNTER — Ambulatory Visit (INDEPENDENT_AMBULATORY_CARE_PROVIDER_SITE_OTHER): Payer: Medicare Other | Admitting: Family Medicine

## 2019-07-16 ENCOUNTER — Other Ambulatory Visit: Payer: Self-pay

## 2019-07-16 ENCOUNTER — Ambulatory Visit: Payer: Medicare Other | Admitting: Family Medicine

## 2019-07-16 ENCOUNTER — Encounter: Payer: Self-pay | Admitting: Family Medicine

## 2019-07-16 DIAGNOSIS — F5101 Primary insomnia: Secondary | ICD-10-CM

## 2019-07-16 DIAGNOSIS — E1159 Type 2 diabetes mellitus with other circulatory complications: Secondary | ICD-10-CM | POA: Diagnosis not present

## 2019-07-16 DIAGNOSIS — E785 Hyperlipidemia, unspecified: Secondary | ICD-10-CM | POA: Diagnosis not present

## 2019-07-16 DIAGNOSIS — E1169 Type 2 diabetes mellitus with other specified complication: Secondary | ICD-10-CM

## 2019-07-16 DIAGNOSIS — E1121 Type 2 diabetes mellitus with diabetic nephropathy: Secondary | ICD-10-CM | POA: Diagnosis not present

## 2019-07-16 DIAGNOSIS — E034 Atrophy of thyroid (acquired): Secondary | ICD-10-CM

## 2019-07-16 DIAGNOSIS — R809 Proteinuria, unspecified: Secondary | ICD-10-CM | POA: Diagnosis not present

## 2019-07-16 DIAGNOSIS — K219 Gastro-esophageal reflux disease without esophagitis: Secondary | ICD-10-CM | POA: Diagnosis not present

## 2019-07-16 DIAGNOSIS — I10 Essential (primary) hypertension: Secondary | ICD-10-CM | POA: Diagnosis not present

## 2019-07-16 DIAGNOSIS — E1122 Type 2 diabetes mellitus with diabetic chronic kidney disease: Secondary | ICD-10-CM

## 2019-07-16 DIAGNOSIS — F411 Generalized anxiety disorder: Secondary | ICD-10-CM

## 2019-07-16 DIAGNOSIS — N183 Chronic kidney disease, stage 3 unspecified: Secondary | ICD-10-CM

## 2019-07-16 LAB — URINALYSIS, COMPLETE
Bilirubin, UA: NEGATIVE
Glucose, UA: NEGATIVE
Leukocytes,UA: NEGATIVE
Nitrite, UA: NEGATIVE
Specific Gravity, UA: 1.025 (ref 1.005–1.030)
Urobilinogen, Ur: 0.2 mg/dL (ref 0.2–1.0)
pH, UA: 6 (ref 5.0–7.5)

## 2019-07-16 LAB — MICROSCOPIC EXAMINATION: Renal Epithel, UA: NONE SEEN /hpf

## 2019-07-16 MED ORDER — ACCU-CHEK SOFTCLIX LANCETS MISC: 3 refills | Status: DC

## 2019-07-16 MED ORDER — ALPRAZOLAM 0.25 MG PO TABS: 0.2500 mg | ORAL_TABLET | Freq: Two times a day (BID) | ORAL | 1 refills | Status: DC | PRN

## 2019-07-16 NOTE — Addendum Note (Signed)
Addended by: Wyline Mood on: 07/16/2019 10:24 AM   Modules accepted: Orders

## 2019-07-16 NOTE — Patient Instructions (Signed)

## 2019-07-16 NOTE — Progress Notes (Signed)
Subjective:  Patient ID: Lisa Sutton, female    DOB: Jun 02, 1933, 86 y.o.   MRN: [id number]  Patient Care Team: Baruch Gouty, FNP as PCP - General (Family Medicine) Rogene Houston, MD as Consulting Physician (Gastroenterology) Marybelle Killings, MD as Consulting Physician (Orthopedic Surgery) Fran Lowes, MD (Inactive) as Consulting Physician (Nephrology) Lavonna Monarch, MD as Consulting Physician (Dermatology) Clent Jacks, MD as Consulting Physician (Ophthalmology) Philemon Kingdom, MD as Consulting Physician (Internal Medicine)   Chief Complaint:  Medical Management of Chronic Issues (4 mo ), Diabetes, Hypothyroidism, Hypertension, and Hyperlipidemia   HPI: Lisa Sutton is a 86 y.o. female presenting on 07/16/2019 for Medical Management of Chronic Issues (4 mo ), Diabetes, Hypothyroidism, Hypertension, and Hyperlipidemia   Diabetes Pertinent negatives for hypoglycemia include no confusion, dizziness, headaches, seizures, speech difficulty or tremors. Pertinent negatives for diabetes include no chest pain, no fatigue, no polydipsia, no polyphagia, no polyuria and no weakness.  Hypertension Pertinent negatives include no chest pain, headaches, palpitations or shortness of breath.  Hyperlipidemia Pertinent negatives include no chest pain, myalgias (tingling in feet at night that is controlled by Tylenol) or shortness of breath.   1. Hypertension associated with type 2 diabetes mellitus ([place]) Well controlled on current regimen. No chest pain, headaches, leg swelling, shortness of breath, palpitations, dizziness, confusion, or syncope.   2. Hyperlipidemia associated with type 2 diabetes mellitus ([place]) Is taking statin therapy and tolerating well. Does watch diet and tries to stay active.   3. Gastroesophageal reflux disease without esophagitis Taking protonix on a daily basis and tolerating it well. No voice change, cough, dysphagia, or changes in bowel color.    4. Type 2 diabetes mellitus with diabetic nephropathy, without long-term current use of insulin ([place]) Compliant with medications and tolerating well. Does see endocrinology on a regular basis. A1C 6.1 last week in office. No polyuria, polydipsia, or polyphagia.   5. CKD stage 3 due to type 2 diabetes mellitus ([place]) 6. Proteinuria, unspecified type Denies changes in urinary output. No edema, confusion, weakness, or fatigue. No shortness of breath.   7. Hypothyroidism due to acquired atrophy of thyroid Compliant with medications - Yes Current medications - Synthroid Adverse side effects - No Weight - stable  Bowel habit changes - No Heat or cold intolerance - No Mood changes - No Changes in sleep habits - No Fatigue - No Skin, hair, or nail changes - No Tremor - No Palpitations - No Edema - No Shortness of breath - No    Relevant past medical, surgical, family, and social history reviewed and updated as indicated.  Allergies and medications reviewed and updated. Date reviewed: Chart in Epic.   Past Medical History:  Diagnosis Date  . Anxiety   . Arthritis   . Cataract   . Dysphagia    'sometimes but not a major issue' been checked out by GI (per pt)  . Dysrhythmia    'heart used to skip but doesn't anymore' was checked out by Dr. Ron Parker late '90s, everything checked out ok and not had any skipping since (all per pt)  . Essential hypertension, benign   . Fatty liver   . GERD (gastroesophageal reflux disease)   . H/O hypercalcemia 04/12/2017  . Hepatitis B    had at age 46, 'GI doc said it's gone away'  . Hepatitis B surface antigen positive   . High cholesterol   . History of hiatal hernia   . Hypercalcemia   .  Hypothyroidism   . Kidney stone August 2014   Patient was seen at [facility]  . Obesity   . Osteoporosis   . PONV (postoperative nausea and vomiting)   . Type 2 diabetes mellitus ([place])     Past Surgical History:  Procedure Laterality Date  . ABDOMINAL  HYSTERECTOMY    . BILATERAL CARPAL TUNNEL RELEASE     Dr. Marlou Sa at surgical center  . BIOPSY  05/26/2017   Procedure: BIOPSY;  Surgeon: Rogene Houston, MD;  [facility];  Service: Endoscopy;;  colon  . BIOPSY  10/11/2018   Procedure: BIOPSY;  Surgeon: Rogene Houston, MD;  [facility];  Service: Endoscopy;;  . CATARACT EXTRACTION, BILATERAL    . COLONOSCOPY N/A 05/26/2017   Procedure: COLONOSCOPY;  Surgeon: Rogene Houston, MD;  [facility];  Service: Endoscopy;  Laterality: N/A;  10:30  . Deviated septum repair    . ESOPHAGOGASTRODUODENOSCOPY N/A 07/28/2016   Procedure: ESOPHAGOGASTRODUODENOSCOPY (EGD);  Surgeon: Rogene Houston, MD;  [facility];  Service: Endoscopy;  Laterality: N/A;  300 - moved to 3/7 @ 1:00  . EYE SURGERY    . FLEXIBLE SIGMOIDOSCOPY N/A 10/11/2018   Procedure: FLEXIBLE SIGMOIDOSCOPY;  Surgeon: Rogene Houston, MD;  [facility];  Service: Endoscopy;  Laterality: N/A;  unable to complete colonoscopy   . KNEE ARTHROPLASTY Left 09/15/2015   Procedure: COMPUTER ASSISTED TOTAL KNEE ARTHROPLASTY;  Surgeon: Marybelle Killings, MD;  Location: [facility];  Service: Orthopedics;  Laterality: Left;  . KNEE ARTHROSCOPY     left knee 2003    Social History   Socioeconomic History  . Marital status: Widowed    Spouse name: Not on file  . Number of children: 1  . Years of education: Not on file  . Highest education level: High school graduate  Occupational History    Comment: retired  Tobacco Use  . Smoking status: Never Smoker  . Smokeless tobacco: Never Used  Substance and Sexual Activity  . Alcohol use: No    Alcohol/week: 0.0 standard drinks  . Drug use: No  . Sexual activity: Never  Other Topics Concern  . Not on file  Social History Narrative   07/04/19 Lives with son   Caffeine- coffee 1 c daily   Social Determinants of Health   Financial Resource Strain:   . Difficulty of Paying Living Expenses: Not on file    Food Insecurity:   . Worried About Charity fundraiser in the Last Year: Not on file  . Ran Out of Food in the Last Year: Not on file  Transportation Needs:   . Lack of Transportation (Medical): Not on file  . Lack of Transportation (Non-Medical): Not on file  Physical Activity:   . Days of Exercise per Week: Not on file  . Minutes of Exercise per Session: Not on file  Stress:   . Feeling of Stress : Not on file  Social Connections:   . Frequency of Communication with Friends and Family: Not on file  . Frequency of Social Gatherings with Friends and Family: Not on file  . Attends Religious Services: Not on file  . Active Member of Clubs or Organizations: Not on file  . Attends Archivist Meetings: Not on file  . Marital Status: Not on file  Intimate Partner Violence:   . Fear of Current or Ex-Partner: Not on file  . Emotionally Abused: Not on file  .  Physically Abused: Not on file  . Sexually Abused: Not on file    Outpatient Encounter Medications as of 07/16/2019  Medication Sig  . acetaminophen (TYLENOL) 500 MG tablet Take 1,000 mg by mouth daily as needed for moderate pain or headache.  . Alpha-Lipoic Acid 600 MG TABS Take 600 mg by mouth 2 (two) times a day.  . ALPRAZolam (XANAX) 0.25 MG tablet Take 1 tablet (0.25 mg total) by mouth 2 (two) times daily as needed for anxiety.  Marland Kitchen amLODipine (NORVASC) 5 MG tablet TAKE 1 TABLET BY MOUTH  DAILY  . amoxicillin (AMOXIL) 500 MG capsule Take 500 mg by mouth every 8 (eight) hours.  . Ascorbic Acid (VITAMIN C) 100 MG tablet Take 100 mg by mouth daily.  Marland Kitchen atenolol (TENORMIN) 50 MG tablet Take 1 tablet (50 mg total) by mouth daily.  Marland Kitchen b complex vitamins tablet Take 1 tablet by mouth daily.  . Cholecalciferol (VITAMIN D3) 1000 units CAPS Take 1,000 Units by mouth daily.   . DULoxetine (CYMBALTA) 30 MG capsule Take 1 capsule (30 mg total) by mouth daily.  Marland Kitchen glipiZIDE (GLUCOTROL) 5 MG tablet Take 1/2 tablet (2.5 mg total) by mouth  daily before supper.  Marland Kitchen glucose blood (ACCU-CHEK AVIVA PLUS) test strip Test BS 2 times daily Dx E11.21  . irbesartan (AVAPRO) 150 MG tablet Take 1 tablet (150 mg total) by mouth daily.  Marland Kitchen levothyroxine (SYNTHROID) 112 MCG tablet Take 1 tablet (112 mcg total) by mouth daily before breakfast.  . levothyroxine (SYNTHROID) 25 MCG tablet TAKE 1 TABLET BY MOUTH  DAILY BEFORE BREAKFAST ON  MONDAY, WEDNESDAY, AND  FRIDAY ONLY ( TAKE WITH  112MCG) AS DIRECTED  . pantoprazole (PROTONIX) 20 MG tablet Take 1 tablet (20 mg total) by mouth daily before breakfast.  . Probiotic Product (PROBIOMAX DAILY DF PO) Take by mouth.  . psyllium (METAMUCIL SMOOTH TEXTURE) 58.6 % powder Take 1 packet by mouth daily.  . rosuvastatin (CRESTOR) 5 MG tablet TAKE 1 TABLET BY MOUTH  DAILY  . [DISCONTINUED] ALPRAZolam (XANAX) 0.25 MG tablet Take 1 tablet (0.25 mg total) by mouth 2 (two) times daily as needed for anxiety.  Marland Kitchen loperamide (IMODIUM) 2 MG capsule Take 1 capsule (2 mg total) by mouth daily after breakfast. (Patient not taking: Reported on 07/16/2019)  . [DISCONTINUED] calcium citrate-vitamin D 200-200 MG-UNIT TABS Take 1 tablet by mouth daily.    . [DISCONTINUED] meclizine (ANTIVERT) 12.5 MG tablet Take 1 tablet (12.5 mg total) by mouth 3 (three) times daily as needed for dizziness.  . [DISCONTINUED] Multiple Vitamin (MULTIVITAMIN WITH MINERALS) TABS tablet Take 1 tablet by mouth daily.  . [DISCONTINUED] traZODone (DESYREL) 50 MG tablet TAKE 1/2 TO 1 TABLET BY  MOUTH AT BEDTIME AS NEEDED  FOR SLEEP  . [DISCONTINUED] Zinc Sulfate (ZINC 15 PO) Take by mouth.   No facility-administered encounter medications on file as of 07/16/2019.    Allergies  Allergen Reactions  . Statins Other (See Comments)    'discomfort, aching everywhere' Tolerates to livalo   . Zetia [Ezetimibe] Other (See Comments)    Leg pain  . Zocor [Simvastatin] Other (See Comments)    Leg pain    Review of Systems  Constitutional: Negative for  activity change, appetite change, chills, diaphoresis, fatigue, fever and unexpected weight change.  HENT: Negative.   Eyes: Negative.  Negative for photophobia and visual disturbance.  Respiratory: Negative for cough, chest tightness and shortness of breath.   Cardiovascular: Negative for chest pain, palpitations and  leg swelling.  Gastrointestinal: Negative for abdominal pain, blood in stool, constipation, diarrhea, nausea and vomiting.  Endocrine: Negative.  Negative for cold intolerance, heat intolerance, polydipsia, polyphagia and polyuria.  Genitourinary: Negative for decreased urine volume, difficulty urinating, dysuria, frequency and urgency.  Musculoskeletal: Negative for arthralgias and myalgias (tingling in feet at night that is controlled by Tylenol).  Skin: Negative.   Allergic/Immunologic: Negative.   Neurological: Negative for dizziness, tremors, seizures, syncope, facial asymmetry, speech difficulty, weakness, light-headedness, numbness and headaches.  Hematological: Negative.   Psychiatric/Behavioral: Negative for confusion, hallucinations, sleep disturbance and suicidal ideas.  All other systems reviewed and are negative.       Objective:  BP 125/72   Pulse (!) 56   Temp (!) 97.1 F (36.2 C)   Resp 20   Ht '5\' 3"'$  (1.6 m)   Wt 164 lb (74.4 kg)   SpO2 97%   BMI 29.05 kg/m    Wt Readings from Last 3 Encounters:  07/16/19 164 lb (74.4 kg)  07/04/19 166 lb 6.4 oz (75.5 kg)  06/20/19 164 lb (74.4 kg)    Physical Exam Vitals and nursing note reviewed.  Constitutional:      General: She is not in acute distress.    Appearance: Normal appearance. She is well-developed and well-groomed. She is obese. She is not ill-appearing, toxic-appearing or diaphoretic.  HENT:     Head: Normocephalic and atraumatic.     Jaw: There is normal jaw occlusion.     Right Ear: Hearing normal.     Left Ear: Hearing normal.     Nose: Nose normal.     Mouth/Throat:     Lips: Pink.      Mouth: Mucous membranes are moist.     Pharynx: Oropharynx is clear. Uvula midline.  Eyes:     General: Lids are normal.     Extraocular Movements: Extraocular movements intact.     Conjunctiva/sclera: Conjunctivae normal.     Pupils: Pupils are equal, round, and reactive to light.  Neck:     Thyroid: No thyroid mass, thyromegaly or thyroid tenderness.     Vascular: No carotid bruit or JVD.     Trachea: Trachea and phonation normal.  Cardiovascular:     Rate and Rhythm: Normal rate and regular rhythm.     Chest Wall: PMI is not displaced.     Pulses: Normal pulses.          Dorsalis pedis pulses are 2+ on the right side and 2+ on the left side.       Posterior tibial pulses are 2+ on the right side and 2+ on the left side.     Heart sounds: Normal heart sounds. No murmur. No friction rub. No gallop.   Pulmonary:     Effort: Pulmonary effort is normal. No respiratory distress.     Breath sounds: Normal breath sounds. No wheezing.  Abdominal:     General: Bowel sounds are normal. There is no distension or abdominal bruit.     Palpations: Abdomen is soft. There is no hepatomegaly or splenomegaly.     Tenderness: There is no abdominal tenderness. There is no right CVA tenderness or left CVA tenderness.     Hernia: No hernia is present.  Musculoskeletal:        General: Normal range of motion.     Cervical back: Normal range of motion and neck supple.     Right lower leg: No edema.     Left lower leg:  No edema.  Feet:     Right foot:     Protective Sensation: 10 sites tested. 10 sites sensed.     Skin integrity: Skin integrity normal.     Toenail Condition: Right toenails are normal.     Left foot:     Protective Sensation: 10 sites tested. 10 sites sensed.     Skin integrity: Skin integrity normal.     Toenail Condition: Left toenails are normal.  Lymphadenopathy:     Cervical: No cervical adenopathy.  Skin:    General: Skin is warm and dry.     Capillary Refill: Capillary  refill takes less than 2 seconds.     Coloration: Skin is not cyanotic, jaundiced or pale.     Findings: No rash.  Neurological:     General: No focal deficit present.     Mental Status: She is alert and oriented to person, place, and time.     Cranial Nerves: Cranial nerves are intact. No cranial nerve deficit.     Sensory: Sensation is intact. No sensory deficit.     Motor: Motor function is intact. No weakness.     Coordination: Coordination is intact. Coordination normal.     Gait: Gait is intact. Gait normal.     Deep Tendon Reflexes: Reflexes are normal and symmetric. Reflexes normal.  Psychiatric:        Attention and Perception: Attention and perception normal.        Mood and Affect: Mood and affect normal.        Speech: Speech normal.        Behavior: Behavior normal. Behavior is cooperative.        Thought Content: Thought content normal.        Cognition and Memory: Cognition and memory normal.        Judgment: Judgment normal.     Results for orders placed or performed in visit on 07/16/19  Microscopic Examination   URINE  Result Value Ref Range   WBC, UA 0-5 0 - 5 /hpf   RBC 3-10 (A) 0 - 2 /hpf   Epithelial Cells (non renal) 0-10 0 - 10 /hpf   Renal Epithel, UA None seen None seen /hpf   Mucus, UA Present Not Estab.   Bacteria, UA Few None seen/Few  Urinalysis, Complete  Result Value Ref Range   Specific Gravity, UA 1.025 1.005 - 1.030   pH, UA 6.0 5.0 - 7.5   Color, UA Yellow Yellow   Appearance Ur Clear Clear   Leukocytes,UA Negative Negative   Protein,UA 3+ (A) Negative/Trace   Glucose, UA Negative Negative   Ketones, UA Trace (A) Negative   RBC, UA Trace (A) Negative   Bilirubin, UA Negative Negative   Urobilinogen, Ur 0.2 0.2 - 1.0 mg/dL   Nitrite, UA Negative Negative   Microscopic Examination See below:        Pertinent labs & imaging results that were available during my care of the patient were reviewed by me and considered in my medical  decision making.  Assessment & Plan:  Nikole was seen today for medical management of chronic issues, diabetes, hypothyroidism, hypertension and hyperlipidemia.  Diagnoses and all orders for this visit:  Hypertension associated with type 2 diabetes mellitus ([place]) BP well controlled. Changes were not made in regimen today. Goal BP is 130/80. Pt aware to report any persistent high or low readings. DASH diet and exercise encouraged. Exercise at least 150 minutes per week and increase as  tolerated. Goal BMI > 25. Stress management encouraged. Avoid nicotine and tobacco product use. Avoid excessive alcohol and NSAID's. Avoid more than 2000 mg of sodium daily. Medications as prescribed. Follow up as scheduled.  -     Thyroid Panel With TSH -     CBC with Differential/Platelet -     CMP14+EGFR  Hyperlipidemia associated with type 2 diabetes mellitus ([place]) Diet encouraged - increase intake of fresh fruits and vegetables, increase intake of lean proteins. Bake, broil, or grill foods. Avoid fried, greasy, and fatty foods. Avoid fast foods. Increase intake of fiber-rich whole grains. Exercise encouraged - at least 150 minutes per week and advance as tolerated.  Goal BMI < 25. Continue medications as prescribed. Follow up in 3-6 months as discussed.  -     Lipid panel  Gastroesophageal reflux disease without esophagitis No red flags present. Diet discussed. Avoid fried, spicy, fatty, greasy, and acidic foods. Avoid caffeine, nicotine, and alcohol. Do not eat 2-3 hours before bedtime and stay upright for at least 1-2 hours after eating. Eat small frequent meals. Avoid NSAID's like motrin and aleve. Medications as prescribed. Report any new or worsening symptoms. Follow up as discussed or sooner if needed.   -     CBC with Differential/Platelet  Type 2 diabetes mellitus with diabetic nephropathy, without long-term current use of insulin ([facility]) A1C 6.1 at endocrinology last week. Diet and exercise encouraged.  Labs pending. No changes in medications.  -     CMP14+EGFR  CKD stage 3 due to type 2 diabetes mellitus ([facility]) Proteinuria, unspecified type Ongoing proteinuria. Pt has seen nephrology in the past, has not been in several years. Referral placed to get reestablished and evaluated for continued proteinuria.        -     CMP14+EGFR -     Urinalysis, Complete -     Microscopic Examination -     Ambulatory referral to Nephrology  Hypothyroidism due to acquired atrophy of thyroid Thyroid disease has been well controlled. Labs are pending. Adjustments to regimen will be made if warranted. Make sure to take medications on an empty stomach with a full glass of water. Make sure to avoid vitamins or supplements for at least 4 hours before and 4 hours after taking medications. Repeat labs in 3 months if adjustments are made and in 6 months if stable.   -     T4, free  GAD (generalized anxiety disorder) Primary insomnia Doing well with below. Will continue.  -     ALPRAZolam (XANAX) 0.25 MG tablet; Take 1 tablet (0.25 mg total) by mouth 2 (two) times daily as needed for anxiety.     Continue all other maintenance medications.  Follow up plan: Return in about 4 months (around 11/13/2019), or if symptoms worsen or fail to improve.  Continue healthy lifestyle choices, including diet (rich in fruits, vegetables, and lean proteins, and low in salt and simple carbohydrates) and exercise (at least 30 minutes of moderate physical activity daily).  Educational handout given for DM  The above assessment and management plan was discussed with the patient. The patient verbalized understanding of and has agreed to the management plan. Patient is aware to call the clinic if they develop any new symptoms or if symptoms persist or worsen. Patient is aware when to return to the clinic for a follow-up visit. Patient educated on when it is appropriate to go to the emergency department.   Monia Pouch,  FNP-C [facility]  [phone]

## 2019-07-16 NOTE — Addendum Note (Signed)
Addended by: Zannie Cove on: 07/16/2019 04:04 PM   Modules accepted: Orders

## 2019-07-17 ENCOUNTER — Telehealth: Payer: Self-pay

## 2019-07-17 ENCOUNTER — Encounter: Payer: Self-pay | Admitting: Internal Medicine

## 2019-07-17 LAB — CMP14+EGFR
ALT: 18 IU/L (ref 0–32)
AST: 24 IU/L (ref 0–40)
Albumin/Globulin Ratio: 1.3 (ref 1.2–2.2)
Albumin: 3.5 g/dL — ABNORMAL LOW (ref 3.6–4.6)
Alkaline Phosphatase: 205 IU/L — ABNORMAL HIGH (ref 39–117)
BUN/Creatinine Ratio: 13 (ref 12–28)
BUN: 19 mg/dL (ref 8–27)
Bilirubin Total: 0.2 mg/dL (ref 0.0–1.2)
CO2: 20 mmol/L (ref 20–29)
Calcium: 8.8 mg/dL (ref 8.7–10.3)
Chloride: 104 mmol/L (ref 96–106)
Creatinine, Ser: 1.46 mg/dL — ABNORMAL HIGH (ref 0.57–1.00)
GFR calc Af Amer: 38 mL/min/{1.73_m2} — ABNORMAL LOW (ref >59)
GFR calc non Af Amer: 33 mL/min/{1.73_m2} — ABNORMAL LOW (ref >59)
Globulin, Total: 2.6 g/dL (ref 1.5–4.5)
Glucose: 166 mg/dL — ABNORMAL HIGH (ref 65–99)
Potassium: 4.7 mmol/L (ref 3.5–5.2)
Sodium: 137 mmol/L (ref 134–144)
Total Protein: 6.1 g/dL (ref 6.0–8.5)

## 2019-07-17 LAB — CBC WITH DIFFERENTIAL/PLATELET
Basophils Absolute: 0.1 10*3/uL (ref 0.0–0.2)
Basos: 1 %
EOS (ABSOLUTE): 0.2 10*3/uL (ref 0.0–0.4)
Eos: 2 %
Hematocrit: 42.5 % (ref 34.0–46.6)
Hemoglobin: 14.7 g/dL (ref 11.1–15.9)
Immature Grans (Abs): 0.1 10*3/uL (ref 0.0–0.1)
Immature Granulocytes: 1 %
Lymphocytes Absolute: 2.6 10*3/uL (ref 0.7–3.1)
Lymphs: 36 %
MCH: 31.6 pg (ref 26.6–33.0)
MCHC: 34.6 g/dL (ref 31.5–35.7)
MCV: 91 fL (ref 79–97)
Monocytes Absolute: 0.7 10*3/uL (ref 0.1–0.9)
Monocytes: 10 %
Neutrophils Absolute: 3.5 10*3/uL (ref 1.4–7.0)
Neutrophils: 50 %
Platelets: 203 10*3/uL (ref 150–450)
RBC: 4.65 x10E6/uL (ref 3.77–5.28)
RDW: 13.3 % (ref 11.7–15.4)
WBC: 7.3 10*3/uL (ref 3.4–10.8)

## 2019-07-17 LAB — THYROID PANEL WITH TSH
Free Thyroxine Index: 1.6 (ref 1.2–4.9)
T3 Uptake Ratio: 23 % — ABNORMAL LOW (ref 24–39)
T4, Total: 6.9 ug/dL (ref 4.5–12.0)
TSH: 20.4 u[IU]/mL — ABNORMAL HIGH (ref 0.450–4.500)

## 2019-07-17 LAB — LIPID PANEL
Chol/HDL Ratio: 6.4 ratio — ABNORMAL HIGH (ref 0.0–4.4)
Cholesterol, Total: 343 mg/dL — ABNORMAL HIGH (ref 100–199)
HDL: 54 mg/dL (ref >39)
LDL Chol Calc (NIH): 205 mg/dL — ABNORMAL HIGH (ref 0–99)
Triglycerides: 404 mg/dL — ABNORMAL HIGH (ref 0–149)
VLDL Cholesterol Cal: 84 mg/dL — ABNORMAL HIGH (ref 5–40)

## 2019-07-17 LAB — T4, FREE: Free T4: 0.96 ng/dL (ref 0.82–1.77)

## 2019-07-17 NOTE — Telephone Encounter (Signed)
I sent her a message about the labs.

## 2019-07-17 NOTE — Telephone Encounter (Signed)
Pt concerned about labs. Thinks COVID vaccine may be contributing to elevated TSH. Informed pt that I would inform Sharyn Lull that she called and that we would call her back with recommendations. Pt would like results to be sent to Dr. Renne Crigler as well.

## 2019-07-17 NOTE — Telephone Encounter (Signed)
Patient had her Thyroid labs done and now results are in Epic, please advise.

## 2019-07-18 ENCOUNTER — Other Ambulatory Visit: Payer: Self-pay | Admitting: Internal Medicine

## 2019-07-18 ENCOUNTER — Other Ambulatory Visit: Payer: Medicare Other

## 2019-07-18 DIAGNOSIS — H04123 Dry eye syndrome of bilateral lacrimal glands: Secondary | ICD-10-CM | POA: Diagnosis not present

## 2019-07-18 DIAGNOSIS — H05012 Cellulitis of left orbit: Secondary | ICD-10-CM | POA: Diagnosis not present

## 2019-07-18 DIAGNOSIS — H02402 Unspecified ptosis of left eyelid: Secondary | ICD-10-CM | POA: Diagnosis not present

## 2019-07-18 DIAGNOSIS — Z961 Presence of intraocular lens: Secondary | ICD-10-CM | POA: Diagnosis not present

## 2019-07-18 DIAGNOSIS — E034 Atrophy of thyroid (acquired): Secondary | ICD-10-CM

## 2019-07-18 DIAGNOSIS — G902 Horner's syndrome: Secondary | ICD-10-CM | POA: Diagnosis not present

## 2019-07-18 MED ORDER — LEVOTHYROXINE SODIUM 137 MCG PO TABS: 137.0000 ug | ORAL_TABLET | Freq: Every day | ORAL | 3 refills | Status: DC

## 2019-07-18 NOTE — Telephone Encounter (Signed)
Patient aware.

## 2019-07-18 NOTE — Telephone Encounter (Signed)
I do not feel this is COVID related and I have forwarded her labs to endocrinology.

## 2019-07-20 ENCOUNTER — Telehealth: Payer: Self-pay | Admitting: Family Medicine

## 2019-07-20 NOTE — Chronic Care Management (AMB) (Signed)
  Chronic Care Management   Note  07/20/2019 Name: Lisa Sutton MRN: [id number] DOB: August 26, 1933  Lisa Sutton is a 86 y.o. year old female who is a primary care patient of Baruch Gouty, FNP. I reached out to [employer] by phone today in response to a referral sent by [name]'s health plan.     [name] was given information about Chronic Care Management services today including:  1. CCM service includes personalized support from designated clinical staff supervised by her physician, including individualized plan of care and coordination with other care providers 2. 24/7 contact phone numbers for assistance for urgent and routine care needs. 3. Service will only be billed when office clinical staff spend 20 minutes or more in a month to coordinate care. 4. Only one practitioner may furnish and bill the service in a calendar month. 5. The patient may stop CCM services at any time (effective at the end of the month) by phone call to the office staff. 6. The patient will be responsible for cost sharing (co-pay) of up to 20% of the service fee (after annual deductible is met).  Patient agreed to services and verbal consent obtained.   Follow up plan: Telephone appointment with care management team member scheduled for:09/05/2019  Noreene Larsson, [address], [place], [place] [address] Direct Dial: [phone] [email or web address] Website: [email or web address]

## 2019-07-24 ENCOUNTER — Encounter (INDEPENDENT_AMBULATORY_CARE_PROVIDER_SITE_OTHER): Payer: Self-pay | Admitting: Internal Medicine

## 2019-07-24 ENCOUNTER — Other Ambulatory Visit: Payer: Self-pay

## 2019-07-24 ENCOUNTER — Ambulatory Visit (INDEPENDENT_AMBULATORY_CARE_PROVIDER_SITE_OTHER): Payer: Medicare Other | Admitting: Internal Medicine

## 2019-07-24 VITALS — BP 134/78 | HR 64 | Temp 97.3°F | Ht 63.0 in | Wt 164.3 lb

## 2019-07-24 DIAGNOSIS — K529 Noninfective gastroenteritis and colitis, unspecified: Secondary | ICD-10-CM

## 2019-07-24 DIAGNOSIS — R1319 Other dysphagia: Secondary | ICD-10-CM

## 2019-07-24 DIAGNOSIS — K2100 Gastro-esophageal reflux disease with esophagitis, without bleeding: Secondary | ICD-10-CM | POA: Diagnosis not present

## 2019-07-24 DIAGNOSIS — R748 Abnormal levels of other serum enzymes: Secondary | ICD-10-CM

## 2019-07-24 DIAGNOSIS — R1310 Dysphagia, unspecified: Secondary | ICD-10-CM | POA: Diagnosis not present

## 2019-07-24 MED ORDER — PANTOPRAZOLE SODIUM 40 MG PO TBEC: 40.0000 mg | DELAYED_RELEASE_TABLET | Freq: Every day | ORAL | 3 refills | Status: DC

## 2019-07-24 NOTE — Progress Notes (Signed)
Presenting complaint;  Follow-up for chronic diarrhea and GERD.  Database and subjective:  Patient is 86 year old Caucasian female who has history of chronic diarrhea with negative work-up felt to be due to IBS chronic GERD and history of dysphagia as well as history of an active hepatitis B carrier state.  Hepatitis B surface antigen was negative in February 2015 per hepatitis B surface antibody was also negative.  She had sigmoidoscopy in May 2020 with colonic biopsies which were negative for microscopic colitis.  When she was last seen in August 2020 she was still complaining of diarrhea.  Patient was advised to take Metamucil 4 g daily. Patient states diarrhea has resolved.  She is having 1-2 formed stools daily.  She is taking Metamucil virtually every night.  She has noted intermittent heartburn which she is also having swallowing difficulty.  Today she was not able to swallow alcohol.  She says she has learned to eat slowly.  She is not losing any weight.  She said recent blood work was abnormal.  Her TSH was elevated.  She was advised to take PPI at least 4 hours after taking her thyroid medication.  She is also concerned because her alkaline phosphatase is elevated. Back in December 2020 she developed left ptosis after she had dental work-up time.  She has been evaluated by neurology and no neurologic diagnosis made.  Was felt that ptosis may have been result of infection following dental work-up. Her appetite is good and her weight remains stable.   Current Medications: Outpatient Encounter Medications as of 07/24/2019  Medication Sig  . Accu-Chek Softclix Lancets lancets Check BS BID and PRN  . acetaminophen (TYLENOL) 500 MG tablet Take 1,000 mg by mouth daily as needed for moderate pain or headache.  . Alpha-Lipoic Acid 600 MG TABS Take 600 mg by mouth 2 (two) times a day.  . ALPRAZolam (XANAX) 0.25 MG tablet Take 1 tablet (0.25 mg total) by mouth 2 (two) times daily as needed for  anxiety.  Marland Kitchen amLODipine (NORVASC) 5 MG tablet TAKE 1 TABLET BY MOUTH  DAILY  . Ascorbic Acid (VITAMIN C) 100 MG tablet Take 100 mg by mouth daily.  Marland Kitchen atenolol (TENORMIN) 50 MG tablet Take 1 tablet (50 mg total) by mouth daily.  Marland Kitchen b complex vitamins tablet Take 1 tablet by mouth daily.  . Cholecalciferol (VITAMIN D3) 1000 units CAPS Take 1,000 Units by mouth daily.   . DULoxetine (CYMBALTA) 30 MG capsule Take 1 capsule (30 mg total) by mouth daily.  Marland Kitchen glipiZIDE (GLUCOTROL) 5 MG tablet Take 1/2 tablet (2.5 mg total) by mouth daily before supper.  Marland Kitchen glucose blood (ACCU-CHEK AVIVA PLUS) test strip Test BS 2 times daily Dx E11.21  . irbesartan (AVAPRO) 150 MG tablet Take 1 tablet (150 mg total) by mouth daily.  Marland Kitchen levothyroxine (SYNTHROID) 137 MCG tablet Take 1 tablet (137 mcg total) by mouth daily before breakfast.  . pantoprazole (PROTONIX) 20 MG tablet Take 1 tablet (20 mg total) by mouth daily before breakfast.  . psyllium (METAMUCIL SMOOTH TEXTURE) 58.6 % powder Take 1 packet by mouth daily.  . rosuvastatin (CRESTOR) 5 MG tablet TAKE 1 TABLET BY MOUTH  DAILY  . amoxicillin (AMOXIL) 500 MG capsule Take 500 mg by mouth every 8 (eight) hours.  Marland Kitchen loperamide (IMODIUM) 2 MG capsule Take 1 capsule (2 mg total) by mouth daily after breakfast. (Patient not taking: Reported on 07/16/2019)  . [DISCONTINUED] calcium citrate-vitamin D 200-200 MG-UNIT TABS Take 1 tablet by mouth  daily.    . [DISCONTINUED] Probiotic Product (PROBIOMAX DAILY DF PO) Take by mouth. Dailt   No facility-administered encounter medications on file as of 07/24/2019.     Objective: Blood pressure 134/78, pulse 64, temperature (!) 97.3 F (36.3 C), temperature source Temporal, height 5' 3"  (1.6 m), weight 164 lb 4.8 oz (74.5 kg). Patient is alert and in no acute distress. She is wearing facial mask Conjunctiva is pink. Sclera is nonicteric Oropharyngeal mucosa is normal. No neck masses or thyromegaly noted. Cardiac exam with  regular rhythm normal S1 and S2. No murmur or gallop noted. Lungs are clear to auscultation. Abdomen is full but soft and nontender without organomegaly or masses. No LE edema or clubbing noted.  Labs/studies Results:  CBC Latest Ref Rng & Units 07/16/2019 02/26/2019 11/09/2018  WBC 3.4 - 10.8 x10E3/uL 7.3 8.1 7.7  Hemoglobin 11.1 - 15.9 g/dL 14.7 13.9 14.3  Hematocrit 34.0 - 46.6 % 42.5 41.1 42.2  Platelets 150 - 450 x10E3/uL 203 186 204    CMP Latest Ref Rng & Units 07/16/2019 02/26/2019 02/13/2019  Glucose 65 - 99 mg/dL 166(H) 147(H) -  BUN 8 - 27 mg/dL 19 28(H) -  Creatinine 0.57 - 1.00 mg/dL 1.46(H) 1.28(H) -  Sodium 134 - 144 mmol/L 137 139 -  Potassium 3.5 - 5.2 mmol/L 4.7 4.8 -  Chloride 96 - 106 mmol/L 104 103 -  CO2 20 - 29 mmol/L 20 22 -  Calcium 8.7 - 10.3 mg/dL 8.8 8.6(L) -  Total Protein 6.0 - 8.5 g/dL 6.1 6.0 6.3  Total Bilirubin 0.0 - 1.2 mg/dL 0.2 0.3 0.3  Alkaline Phos 39 - 117 IU/L 205(H) 153(H) -  AST 0 - 40 IU/L 24 26 20   ALT 0 - 32 IU/L 18 23 17     Hepatic Function Latest Ref Rng & Units 07/16/2019 02/26/2019 02/13/2019  Total Protein 6.0 - 8.5 g/dL 6.1 6.0 6.3  Albumin 3.6 - 4.6 g/dL 3.5(L) 3.5(L) -  AST 0 - 40 IU/L 24 26 20   ALT 0 - 32 IU/L 18 23 17   Alk Phosphatase 39 - 117 IU/L 205(H) 153(H) -  Total Bilirubin 0.0 - 1.2 mg/dL 0.2 0.3 0.3  Bilirubin, Direct 0.0 - 0.2 mg/dL - - 0.1      Assessment:  #1.  Chronic diarrhea.  Colonic biopsy with microscopic colitis.  Since she has been taking Metamucil she is having normal stools which would further support the diagnosis of irritable bowel syndrome.  She could also have diabetic diarrhea.  She is doing well.  Her no further work-up indicated.  #2.  Esophageal dysphagia.  She has history of erosive reflux esophagitis.  She may have developed esophageal stricture Schatzki's ring or she may simply have esophageal motility disorder which is not uncommon in her age group. We will proceed with barium pill  study.  #3.  GERD.  She has a history of erosive reflux esophagitis and hiatal hernia based on EGD of 3 years ago.  Low-dose pantoprazole is not controlling her symptoms.  She needs to be back on 40 mg daily which she can take for life so there would be no interaction with her thyroid medication.  #4.  Elevated alkaline phosphatase.  Elevated alkaline phosphatase may be related to her medications.  It remains to be seen if it would come down when she becomes euthyroid.  Last ultrasound was 3 years ago and did not reveal dilated bile duct.  She has fatty liver but transaminases are normal.  Her past history is significant for inactive hepatitis B carrier stage.  She tested positive for hepatitis B surface antigen but her HBV DNA was always negative and finally in February 2015 hepatitis B surface antigen was negative as well.  I do not feel that there is any need to repeat the studies at this time.   Plan:  Barium pill esophagogram. Increase pantoprazole to 40 mg daily but take it 30 minutes before lunch.  Can take famotidine OTC 20 mg at bedtime on as-needed basis. LFTs will be repeated later this month. Further recommendations to follow. Office visit in 6 months.

## 2019-07-24 NOTE — Patient Instructions (Signed)
Take pantoprazole 30 minutes before lunch daily.  Please note dose is now 40 mg. Can take Pepcid/famotidine OTC 20 mg in the evening or at night for breakthrough symptoms Physician will call with results of barium study when completed.

## 2019-07-31 ENCOUNTER — Other Ambulatory Visit: Payer: Self-pay

## 2019-07-31 ENCOUNTER — Ambulatory Visit (HOSPITAL_COMMUNITY)
Admission: RE | Admit: 2019-07-31 | Discharge: 2019-07-31 | Disposition: A | Discharge status: Home / Self Care | Payer: Medicare Other | Source: Ambulatory Visit | Attending: Internal Medicine | Admitting: Internal Medicine

## 2019-07-31 ENCOUNTER — Other Ambulatory Visit (INDEPENDENT_AMBULATORY_CARE_PROVIDER_SITE_OTHER): Payer: Self-pay | Admitting: Internal Medicine

## 2019-07-31 DIAGNOSIS — R1310 Dysphagia, unspecified: Secondary | ICD-10-CM | POA: Diagnosis not present

## 2019-07-31 DIAGNOSIS — K449 Diaphragmatic hernia without obstruction or gangrene: Secondary | ICD-10-CM | POA: Diagnosis not present

## 2019-07-31 DIAGNOSIS — R1319 Other dysphagia: Secondary | ICD-10-CM

## 2019-07-31 DIAGNOSIS — K224 Dyskinesia of esophagus: Secondary | ICD-10-CM | POA: Diagnosis not present

## 2019-08-01 ENCOUNTER — Other Ambulatory Visit (INDEPENDENT_AMBULATORY_CARE_PROVIDER_SITE_OTHER): Payer: Self-pay | Admitting: *Deleted

## 2019-08-01 ENCOUNTER — Encounter (INDEPENDENT_AMBULATORY_CARE_PROVIDER_SITE_OTHER): Payer: Self-pay

## 2019-08-01 DIAGNOSIS — R1319 Other dysphagia: Secondary | ICD-10-CM

## 2019-08-01 DIAGNOSIS — R1310 Dysphagia, unspecified: Secondary | ICD-10-CM

## 2019-08-01 DIAGNOSIS — K222 Esophageal obstruction: Secondary | ICD-10-CM

## 2019-08-14 ENCOUNTER — Other Ambulatory Visit (HOSPITAL_COMMUNITY)
Admission: RE | Admit: 2019-08-14 | Discharge: 2019-08-14 | Disposition: A | Discharge status: Home / Self Care | Payer: Medicare Other | Source: Ambulatory Visit | Attending: Internal Medicine | Admitting: Internal Medicine

## 2019-08-14 ENCOUNTER — Other Ambulatory Visit: Payer: Self-pay

## 2019-08-14 DIAGNOSIS — K222 Esophageal obstruction: Secondary | ICD-10-CM | POA: Diagnosis not present

## 2019-08-14 DIAGNOSIS — I10 Essential (primary) hypertension: Secondary | ICD-10-CM | POA: Diagnosis not present

## 2019-08-14 DIAGNOSIS — Z20822 Contact with and (suspected) exposure to covid-19: Secondary | ICD-10-CM | POA: Diagnosis not present

## 2019-08-14 DIAGNOSIS — E7800 Pure hypercholesterolemia, unspecified: Secondary | ICD-10-CM | POA: Diagnosis not present

## 2019-08-14 DIAGNOSIS — E039 Hypothyroidism, unspecified: Secondary | ICD-10-CM | POA: Diagnosis not present

## 2019-08-14 DIAGNOSIS — Z79899 Other long term (current) drug therapy: Secondary | ICD-10-CM | POA: Diagnosis not present

## 2019-08-14 DIAGNOSIS — E119 Type 2 diabetes mellitus without complications: Secondary | ICD-10-CM | POA: Diagnosis not present

## 2019-08-14 DIAGNOSIS — F419 Anxiety disorder, unspecified: Secondary | ICD-10-CM | POA: Diagnosis not present

## 2019-08-14 DIAGNOSIS — K449 Diaphragmatic hernia without obstruction or gangrene: Secondary | ICD-10-CM | POA: Diagnosis not present

## 2019-08-14 DIAGNOSIS — E669 Obesity, unspecified: Secondary | ICD-10-CM | POA: Diagnosis not present

## 2019-08-14 DIAGNOSIS — K2100 Gastro-esophageal reflux disease with esophagitis, without bleeding: Secondary | ICD-10-CM | POA: Diagnosis not present

## 2019-08-14 DIAGNOSIS — R1310 Dysphagia, unspecified: Secondary | ICD-10-CM | POA: Diagnosis present

## 2019-08-14 DIAGNOSIS — Z79890 Hormone replacement therapy (postmenopausal): Secondary | ICD-10-CM | POA: Diagnosis not present

## 2019-08-14 DIAGNOSIS — Z6829 Body mass index (BMI) 29.0-29.9, adult: Secondary | ICD-10-CM | POA: Diagnosis not present

## 2019-08-14 DIAGNOSIS — K259 Gastric ulcer, unspecified as acute or chronic, without hemorrhage or perforation: Secondary | ICD-10-CM | POA: Diagnosis not present

## 2019-08-14 DIAGNOSIS — Z7984 Long term (current) use of oral hypoglycemic drugs: Secondary | ICD-10-CM | POA: Diagnosis not present

## 2019-08-14 LAB — SARS CORONAVIRUS 2 (TAT 6-24 HRS): SARS Coronavirus 2: NEGATIVE

## 2019-08-16 ENCOUNTER — Encounter (HOSPITAL_COMMUNITY): Payer: Self-pay | Admitting: Internal Medicine

## 2019-08-16 ENCOUNTER — Ambulatory Visit (HOSPITAL_COMMUNITY)
Admission: RE | Admit: 2019-08-16 | Discharge: 2019-08-16 | Disposition: A | Discharge status: Home / Self Care | Payer: Medicare Other | Attending: Internal Medicine | Admitting: Internal Medicine

## 2019-08-16 ENCOUNTER — Encounter (HOSPITAL_COMMUNITY)
Admission: RE | Disposition: A | Discharge status: Home / Self Care | Payer: Self-pay | Source: Home / Self Care | Attending: Internal Medicine

## 2019-08-16 ENCOUNTER — Other Ambulatory Visit: Payer: Self-pay

## 2019-08-16 DIAGNOSIS — K449 Diaphragmatic hernia without obstruction or gangrene: Secondary | ICD-10-CM | POA: Diagnosis not present

## 2019-08-16 DIAGNOSIS — K2100 Gastro-esophageal reflux disease with esophagitis, without bleeding: Secondary | ICD-10-CM | POA: Insufficient documentation

## 2019-08-16 DIAGNOSIS — R1314 Dysphagia, pharyngoesophageal phase: Secondary | ICD-10-CM | POA: Diagnosis not present

## 2019-08-16 DIAGNOSIS — F419 Anxiety disorder, unspecified: Secondary | ICD-10-CM | POA: Insufficient documentation

## 2019-08-16 DIAGNOSIS — K3189 Other diseases of stomach and duodenum: Secondary | ICD-10-CM | POA: Diagnosis not present

## 2019-08-16 DIAGNOSIS — Z7984 Long term (current) use of oral hypoglycemic drugs: Secondary | ICD-10-CM | POA: Diagnosis not present

## 2019-08-16 DIAGNOSIS — E669 Obesity, unspecified: Secondary | ICD-10-CM | POA: Insufficient documentation

## 2019-08-16 DIAGNOSIS — Z79890 Hormone replacement therapy (postmenopausal): Secondary | ICD-10-CM | POA: Insufficient documentation

## 2019-08-16 DIAGNOSIS — K222 Esophageal obstruction: Secondary | ICD-10-CM | POA: Diagnosis not present

## 2019-08-16 DIAGNOSIS — Z79899 Other long term (current) drug therapy: Secondary | ICD-10-CM | POA: Diagnosis not present

## 2019-08-16 DIAGNOSIS — Z20822 Contact with and (suspected) exposure to covid-19: Secondary | ICD-10-CM | POA: Diagnosis not present

## 2019-08-16 DIAGNOSIS — K259 Gastric ulcer, unspecified as acute or chronic, without hemorrhage or perforation: Secondary | ICD-10-CM | POA: Insufficient documentation

## 2019-08-16 DIAGNOSIS — R1310 Dysphagia, unspecified: Secondary | ICD-10-CM

## 2019-08-16 DIAGNOSIS — E119 Type 2 diabetes mellitus without complications: Secondary | ICD-10-CM | POA: Insufficient documentation

## 2019-08-16 DIAGNOSIS — K228 Other specified diseases of esophagus: Secondary | ICD-10-CM | POA: Diagnosis not present

## 2019-08-16 DIAGNOSIS — R1319 Other dysphagia: Secondary | ICD-10-CM

## 2019-08-16 DIAGNOSIS — I10 Essential (primary) hypertension: Secondary | ICD-10-CM | POA: Diagnosis not present

## 2019-08-16 DIAGNOSIS — E039 Hypothyroidism, unspecified: Secondary | ICD-10-CM | POA: Insufficient documentation

## 2019-08-16 DIAGNOSIS — E7800 Pure hypercholesterolemia, unspecified: Secondary | ICD-10-CM | POA: Diagnosis not present

## 2019-08-16 DIAGNOSIS — Z6829 Body mass index (BMI) 29.0-29.9, adult: Secondary | ICD-10-CM | POA: Insufficient documentation

## 2019-08-16 DIAGNOSIS — K209 Esophagitis, unspecified without bleeding: Secondary | ICD-10-CM | POA: Diagnosis not present

## 2019-08-16 HISTORY — PX: ESOPHAGEAL DILATION: SHX303

## 2019-08-16 HISTORY — PX: ESOPHAGOGASTRODUODENOSCOPY: SHX5428

## 2019-08-16 LAB — GLUCOSE, CAPILLARY: Glucose-Capillary: 146 mg/dL — ABNORMAL HIGH (ref 70–99)

## 2019-08-16 SURGERY — EGD (ESOPHAGOGASTRODUODENOSCOPY)
Anesthesia: Moderate Sedation

## 2019-08-16 MED ORDER — MEPERIDINE HCL 50 MG/ML IJ SOLN
INTRAMUSCULAR | Status: DC | PRN
  Administered 2019-08-16 (×2): 25 mg

## 2019-08-16 MED ORDER — MIDAZOLAM HCL 5 MG/5ML IJ SOLN
INTRAMUSCULAR | Status: AC
  Filled 2019-08-16: qty 5

## 2019-08-16 MED ORDER — SODIUM CHLORIDE 0.9 % IV SOLN: INTRAVENOUS | Status: DC

## 2019-08-16 MED ORDER — LIDOCAINE VISCOUS HCL 2 % MT SOLN
OROMUCOSAL | Status: DC | PRN
  Administered 2019-08-16: 1 via OROMUCOSAL

## 2019-08-16 MED ORDER — MEPERIDINE HCL 50 MG/ML IJ SOLN
INTRAMUSCULAR | Status: AC
  Filled 2019-08-16: qty 1

## 2019-08-16 MED ORDER — FAMOTIDINE 20 MG PO TABS: 20.0000 mg | ORAL_TABLET | Freq: Every day | ORAL | Status: DC

## 2019-08-16 MED ORDER — LIDOCAINE VISCOUS HCL 2 % MT SOLN
OROMUCOSAL | Status: AC
  Filled 2019-08-16: qty 15

## 2019-08-16 MED ORDER — MIDAZOLAM HCL 5 MG/5ML IJ SOLN
INTRAMUSCULAR | Status: DC | PRN
  Administered 2019-08-16 (×3): 1 mg via INTRAVENOUS

## 2019-08-16 NOTE — H&P (Addendum)
Lisa Sutton is an 86 y.o. female.   Chief Complaint: Patient is here for esophagogastroduodenoscopy and esophageal dilation.  HPI: Patient is 86 year old Caucasian female who has chronic GERD who was reseen in the office for solid food dysphagia.  Barium study was obtained and she has distal esophageal ring/stricture.  She states she is doing better with PPI.  She is having heartburn maybe once a week.  She denies abdominal pain or melena.  Patient does not take any aspirin.  Past Medical History:  Diagnosis Date  . Anxiety   . Arthritis   . Cataract   . Dysphagia    'sometimes but not a major issue' been checked out by GI (per pt)  . Dysrhythmia    'heart used to skip but doesn't anymore' was checked out by Dr. Ron Parker late '90s, everything checked out ok and not had any skipping since (all per pt)  . Essential hypertension, benign   . Fatty liver   . GERD (gastroesophageal reflux disease)   . H/O hypercalcemia 04/12/2017  . Hepatitis B    had at age 41, 'GI doc said it's gone away'  . Hepatitis B surface antigen positive   . High cholesterol   . History of hiatal hernia   . Hypercalcemia   . Hypothyroidism   . Kidney stone August 2014   Patient was [facility] at [facility]  . Obesity   . Osteoporosis   . PONV (postoperative nausea and vomiting)   . Type 2 diabetes mellitus ([place])     Past Surgical History:  Procedure Laterality Date  . ABDOMINAL HYSTERECTOMY    . BILATERAL CARPAL TUNNEL RELEASE     Dr. Marlou Sa at surgical center  . BIOPSY  05/26/2017   Procedure: BIOPSY;  Surgeon: Rogene Houston, MD;  [facility];  Service: Endoscopy;;  colon  . BIOPSY  10/11/2018   Procedure: BIOPSY;  Surgeon: Rogene Houston, MD;  [facility]: [facility];  Service: Endoscopy;;  . CATARACT EXTRACTION, BILATERAL    . COLONOSCOPY N/A 05/26/2017   Procedure: COLONOSCOPY;  Surgeon: Rogene Houston, MD;  [facility]: [facility];  Service: Endoscopy;  Laterality: N/A;  10:30  . Deviated  septum repair    . ESOPHAGOGASTRODUODENOSCOPY N/A 07/28/2016   Procedure: ESOPHAGOGASTRODUODENOSCOPY (EGD);  Surgeon: Rogene Houston, MD;  [facility]: [facility];  Service: Endoscopy;  Laterality: N/A;  300 - moved to 3/7 @ 1:00  . EYE SURGERY    . FLEXIBLE SIGMOIDOSCOPY N/A 10/11/2018   Procedure: FLEXIBLE SIGMOIDOSCOPY;  Surgeon: Rogene Houston, MD;  Location: [facility];  Service: Endoscopy;  Laterality: N/A;  unable to complete colonoscopy   . KNEE ARTHROPLASTY Left 09/15/2015   Procedure: COMPUTER ASSISTED TOTAL KNEE ARTHROPLASTY;  Surgeon: Marybelle Killings, MD;  Location: [place];  Service: Orthopedics;  Laterality: Left;  . KNEE ARTHROSCOPY     left knee 2003    Family History  Problem Relation Age of Onset  . Hypertension Father   . Hip fracture Father   . COPD Mother   . Heart attack Mother   . Cancer Maternal Aunt   . Diabetes Maternal Aunt   . Diabetes Maternal Uncle   . Colon cancer Neg Hx    Social History:  reports that she has never smoked. She has never used smokeless tobacco. She reports that she does not drink alcohol or use drugs.  Allergies:  Allergies  Allergen Reactions  . Statins Other (See Comments)    '  discomfort, aching everywhere' Tolerates to livalo   . Zetia [Ezetimibe] Other (See Comments)    Leg pain  . Zocor [Simvastatin] Other (See Comments)    Leg pain    Medications Prior to Admission  Medication Sig Dispense Refill  . acetaminophen (TYLENOL) 500 MG tablet Take 1,000 mg by mouth daily as needed for moderate pain or headache.    . Alpha-Lipoic Acid 600 MG TABS Take 600 mg by mouth 2 (two) times a day.    . ALPRAZolam (XANAX) 0.25 MG tablet Take 1 tablet (0.25 mg total) by mouth 2 (two) times daily as needed for anxiety. (Patient taking differently: Take 0.25 mg by mouth at bedtime as needed (sleep.). ) 180 tablet 1  . amLODipine (NORVASC) 5 MG tablet TAKE 1 TABLET BY MOUTH  DAILY (Patient taking differently: Take 5 mg by mouth at bedtime. ) 90  tablet 1  . Ascorbic Acid (VITAMIN C) 1000 MG tablet Take 1,000 mg by mouth at bedtime.    Marland Kitchen atenolol (TENORMIN) 50 MG tablet Take 1 tablet (50 mg total) by mouth daily. 90 tablet 1  . b complex vitamins tablet Take 1 tablet by mouth daily.    . Cholecalciferol (VITAMIN D3) 50 MCG (2000 UT) TABS Take 2,000 mg by mouth at bedtime.    . DULoxetine (CYMBALTA) 30 MG capsule Take 1 capsule (30 mg total) by mouth daily. (Patient taking differently: Take 30 mg by mouth at bedtime. ) 90 capsule 0  . glipiZIDE (GLUCOTROL) 5 MG tablet Take 1/2 tablet (2.5 mg total) by mouth daily before supper. (Patient taking differently: Take 2.5 mg by mouth See admin instructions. Take 0.5 tablet (2.5 mg) by mouth with a large supper) 45 tablet 0  . irbesartan (AVAPRO) 150 MG tablet Take 1 tablet (150 mg total) by mouth daily. 90 tablet 3  . levothyroxine (SYNTHROID) 137 MCG tablet Take 1 tablet (137 mcg total) by mouth daily before breakfast. 45 tablet 3  . Omega-3 Fatty Acids (FISH OIL PO) Take 700 mg by mouth at bedtime.    . pantoprazole (PROTONIX) 40 MG tablet Take 1 tablet (40 mg total) by mouth daily before lunch. 90 tablet 3  . psyllium (METAMUCIL SMOOTH TEXTURE) 58.6 % powder Take 1 packet by mouth daily. (Patient taking differently: Take 1 packet by mouth daily as needed (regularity). ) 283 g 12  . rosuvastatin (CRESTOR) 5 MG tablet TAKE 1 TABLET BY MOUTH  DAILY (Patient taking differently: Take 5 mg by mouth at bedtime. ) 90 tablet 1  . vitamin B-12 (CYANOCOBALAMIN) 1000 MCG tablet Take 2,000 mcg by mouth at bedtime. Gummies    . Accu-Chek Softclix Lancets lancets Check BS BID and PRN 200 each 3  . glucose blood (ACCU-CHEK AVIVA PLUS) test strip Test BS 2 times daily Dx E11.21 200 strip 3    Results for orders placed or performed during the hospital encounter of 08/16/19 (from the past 48 hour(s))  Glucose, capillary     Status: Abnormal   Collection Time: 08/16/19 10:45 AM  Result Value Ref Range    Glucose-Capillary 146 (H) 70 - 99 mg/dL    Comment: Glucose reference range applies only to samples taken after fasting for at least 8 hours.   No results found.  Review of Systems  Blood pressure (!) 151/67, pulse (!) 53, temperature 98 F (36.7 C), temperature source Oral, resp. rate 16, height 5\' 3"  (1.6 m), SpO2 97 %. Physical Exam  Constitutional: She appears well-developed and well-nourished.  HENT:  Mouth/Throat: Oropharynx is clear and moist.  Eyes: Conjunctivae are normal. No scleral icterus.  Neck: No thyromegaly present.  Cardiovascular: Normal rate, regular rhythm and normal heart sounds.  No murmur heard. Respiratory: Effort normal and breath sounds normal.  GI: Soft. She exhibits no distension. There is no abdominal tenderness.  Musculoskeletal:        General: No edema.  Lymphadenopathy:    She has no cervical adenopathy.  Neurological: She is alert.  Skin: Skin is warm and dry.     Assessment/Plan Esophageal dysphagia secondary to distal esophageal ring/stricture. Esophagogastroduodenoscopy with esophageal dilation.  [name], MD 08/16/2019, 11:50 AM

## 2019-08-16 NOTE — Op Note (Signed)
[facility] Patient Name: Lisa Sutton Procedure Date: 08/16/2019 11:21 AM MRN: [id number] Date of Birth: 1933-11-01 Attending MD: [name] , MD CSN: [id number] Age: 86 Admit Type: Outpatient Procedure:                Upper GI endoscopy Indications:              Esophageal dysphagia, For therapy of esophageal                            stricture Providers:                [name], MD, Janeece Riggers, RN, Randa Spike,                            Technician Referring MD:             Connye Burkitt. Rakes, NP Medicines:                Lidocaine spray, Meperidine 50 mg IV, Midazolam 3                            mg IV Complications:            No immediate complications. Estimated Blood Loss:     Estimated blood loss was minimal. Procedure:                Pre-Anesthesia Assessment:                           - Prior to the procedure, a History and Physical                            was performed, and patient medications and                            allergies were reviewed. The patient's tolerance of                            previous anesthesia was also reviewed. The risks                            and benefits of the procedure and the sedation                            options and risks were discussed with the patient.                            All questions were answered, and informed consent                            was obtained. Prior Anticoagulants: The patient has                            taken no previous anticoagulant or antiplatelet  agents. ASA Grade Assessment: II - A patient with                            mild systemic disease. After reviewing the risks                            and benefits, the patient was deemed in                            satisfactory condition to undergo the procedure.                           After obtaining informed consent, the endoscope was                            passed under direct vision.  Throughout the                            procedure, the patient's blood pressure, pulse, and                            oxygen saturations were monitored continuously. The                            [id number]-[id number] ([id number]) scope was introduced through the                            mouth, and advanced to the second part of duodenum.                            The upper GI endoscopy was accomplished without                            difficulty. The patient tolerated the procedure                            well. Scope In: 11:59:55 AM Scope Out: 12:09:36 PM Total Procedure Duration: 0 hours 9 minutes 41 seconds  Findings:      The hypopharynx was normal.      The proximal esophagus and mid esophagus were normal.      LA Grade C (one or more mucosal breaks continuous between tops of 2 or       more mucosal folds, less than 75% circumference) esophagitis with no       bleeding was found 29 to 35 cm from the incisors. Biopsies were taken       with a cold forceps for histology.      One benign-appearing, intrinsic moderate stenosis was found 35 cm from       the incisors. The stenosis was traversed. A TTS dilator was passed       through the scope. Dilation with a 15-16.5-18 mm balloon dilator was       performed to 15 mm and 16.5 mm. The dilation site was examined and       showed mild mucosal disruption, moderate improvement in luminal  narrowing and no perforation.      A 3 cm hiatal hernia was present.      A single erosion with no stigmata of recent bleeding was found in the       prepyloric region of the stomach.      The exam of the stomach was otherwise normal.      The duodenal bulb and second portion of the duodenum were normal. Impression:               - Normal hypopharynx.                           - Normal proximal esophagus and mid esophagus.                           - LA Grade C esophagitis with no bleeding. Biopsied.                           - Benign-appearing  esophageal stenosis. Dilated.                           - 3 cm hiatal hernia.                           - Erosive gastropathy with no stigmata of recent                            bleeding.                           - Normal duodenal bulb and second portion of the                            duodenum                           . Moderate Sedation:      Moderate (conscious) sedation was administered by the endoscopy nurse       and supervised by the endoscopist. The following parameters were       monitored: oxygen saturation, heart rate, blood pressure, CO2       capnography and response to care. Total physician intraservice time was       15 minutes. Recommendation:           - Patient has a contact number available for                            emergencies. The signs and symptoms of potential                            delayed complications were discussed with the                            patient. Return to normal activities tomorrow.                            Written discharge instructions were provided to the  patient.                           - Resume previous diet today.                           - Continue present medications.                           - Famotidine 20 mg po qhs.                           - No aspirin, ibuprofen, naproxen, or other                            non-steroidal anti-inflammatory drugs for 3 days.                           - Await pathology results.                           - Repeat upper endoscopy PRN. Procedure Code(s):        --- Professional ---                           [phone], Esophagogastroduodenoscopy, flexible,                            transoral; with transendoscopic balloon dilation of                            esophagus (less than 30 mm diameter)                           G0500, Moderate sedation services provided by the                            same physician or other qualified health care                             professional performing a gastrointestinal                            endoscopic service that sedation supports,                            requiring the presence of an independent trained                            observer to assist in the monitoring of the                            patient's level of consciousness and physiological                            status; initial 15 minutes of intra-service time;  patient age 42 years or older (additional time may                            be reported with [phone], as appropriate) Diagnosis Code(s):        --- Professional ---                           K20.90, Esophagitis, unspecified without bleeding                           K22.2, Esophageal obstruction                           K44.9, Diaphragmatic hernia without obstruction or                            gangrene                           K31.89, Other diseases of stomach and duodenum                           R13.14, Dysphagia, pharyngoesophageal phase CPT copyright 2019 [employer]. All rights reserved. The codes documented in this report are preliminary and upon coder review may  be revised to meet current compliance requirements. [name], MD [name], MD 08/16/2019 12:19:48 PM This report has been signed electronically. Number of Addenda: 0

## 2019-08-16 NOTE — Discharge Instructions (Signed)
No aspirin or NSAIDs for 3 days. Resume usual medications as before. Pepcid/famotidine OTC 20 mg by mouth daily at bedtime. Resume usual diet. No driving for 24 hours. Physician will call with biopsy results.   Upper Endoscopy, Adult, Care After This sheet gives you information about how to care for yourself after your procedure. Your health care provider may also give you more specific instructions. If you have problems or questions, contact your health care provider. What can I expect after the procedure? After the procedure, it is common to have:  A sore throat.  Mild stomach pain or discomfort.  Bloating.  Nausea. Follow these instructions at home:   Follow instructions from your health care provider about what to eat or drink after your procedure.  Return to your normal activities as told by your health care provider. Ask your health care provider what activities are safe for you.  Take over-the-counter and prescription medicines only as told by your health care provider.  Do not drive for 24 hours if you were given a sedative during your procedure.  Keep all follow-up visits as told by your health care provider. This is important. Contact a health care provider if you have:  A sore throat that lasts longer than one day.  Trouble swallowing. Get help right away if:  You vomit blood or your vomit looks like coffee grounds.  You have: ? A fever. ? Bloody, black, or tarry stools. ? A severe sore throat or you cannot swallow. ? Difficulty breathing. ? Severe pain in your chest or abdomen. Summary  After the procedure, it is common to have a sore throat, mild stomach discomfort, bloating, and nausea.  Do not drive for 24 hours if you were given a sedative during the procedure.  Follow instructions from your health care provider about what to eat or drink after your procedure.  Return to your normal activities as told by your health care provider. This  information is not intended to replace advice given to you by your health care provider. Make sure you discuss any questions you have with your health care provider. Document Revised: 11/01/2017 Document Reviewed: 10/10/2017 Elsevier Patient Education  [place].  Hiatal Hernia  A hiatal hernia occurs when part of the stomach slides above the muscle that separates the abdomen from the chest (diaphragm). A person can be born with a hiatal hernia (congenital), or it may develop over time. In almost all cases of hiatal hernia, only the top part of the stomach pushes through the diaphragm. Many people have a hiatal hernia with no symptoms. The larger the hernia, the more likely it is that you will have symptoms. In some cases, a hiatal hernia allows stomach acid to flow back into the tube that carries food from your mouth to your stomach (esophagus). This may cause heartburn symptoms. Severe heartburn symptoms may mean that you have developed a condition called gastroesophageal reflux disease (GERD). What are the causes? This condition is caused by a weakness in the opening (hiatus) where the esophagus passes through the diaphragm to attach to the upper part of the stomach. A person may be born with a weakness in the hiatus, or a weakness can develop over time. What increases the risk? This condition is more likely to develop in:  Older people. Age is a major risk factor for a hiatal hernia, especially if you are over the age of 31.  Pregnant women.  People who are overweight.  People who have frequent  constipation. What are the signs or symptoms? Symptoms of this condition usually develop in the form of GERD symptoms. Symptoms include:  Heartburn.  Belching.  Indigestion.  Trouble swallowing.  Coughing or wheezing.  Sore throat.  Hoarseness.  Chest pain.  Nausea and vomiting. How is this diagnosed? This condition may be diagnosed during testing for GERD. Tests that may  be done include:  X-rays of your stomach or chest.  An upper gastrointestinal (GI) series. This is an X-ray exam of your GI tract that is taken after you swallow a chalky liquid that shows up clearly on the X-ray.  Endoscopy. This is a procedure to look into your stomach using a thin, flexible tube that has a tiny camera and light on the end of it. How is this treated? This condition may be treated by:  Dietary and lifestyle changes to help reduce GERD symptoms.  Medicines. These may include: ? Over-the-counter antacids. ? Medicines that make your stomach empty more quickly. ? Medicines that block the production of stomach acid (H2 blockers). ? Stronger medicines to reduce stomach acid (proton pump inhibitors).  Surgery to repair the hernia, if other treatments are not helping. If you have no symptoms, you may not need treatment. Follow these instructions at home: Lifestyle and activity  Do not use any products that contain nicotine or tobacco, such as cigarettes and e-cigarettes. If you need help quitting, ask your health care provider.  Try to achieve and maintain a healthy body weight.  Avoid putting pressure on your abdomen. Anything that puts pressure on your abdomen increases the amount of acid that may be pushed up into your esophagus. ? Avoid bending over, especially after eating. ? Raise the head of your bed by putting blocks under the legs. This keeps your head and esophagus higher than your stomach. ? Do not wear tight clothing around your chest or stomach. ? Try not to strain when having a bowel movement, when urinating, or when lifting heavy objects. Eating and drinking  Avoid foods that can worsen GERD symptoms. These may include: ? Fatty foods, like fried foods. ? Citrus fruits, like oranges or lemon. ? Other foods and drinks that contain acid, like orange juice or tomatoes. ? Spicy food. ? Chocolate.  Eat frequent small meals instead of three large meals a  day. This helps prevent your stomach from getting too full. ? Eat slowly. ? Do not lie down right after eating. ? Do not eat 1-2 hours before bed.  Do not drink beverages with caffeine. These include cola, coffee, cocoa, and tea.  Do not drink alcohol. General instructions  Take over-the-counter and prescription medicines only as told by your health care provider.  Keep all follow-up visits as told by your health care provider. This is important. Contact a health care provider if:  Your symptoms are not controlled with medicines or lifestyle changes.  You are having trouble swallowing.  You have coughing or wheezing that will not go away. Get help right away if:  Your pain is getting worse.  Your pain spreads to your arms, neck, jaw, teeth, or back.  You have shortness of breath.  You sweat for no reason.  You feel sick to your stomach (nauseous) or you vomit.  You vomit blood.  You have bright red blood in your stools.  You have black, tarry stools. This information is not intended to replace advice given to you by your health care provider. Make sure you discuss any questions you have  with your health care provider. Document Revised: 04/22/2017 Document Reviewed: 12/13/2016 Elsevier Patient Education  [place].

## 2019-08-17 DIAGNOSIS — N1832 Chronic kidney disease, stage 3b: Secondary | ICD-10-CM | POA: Diagnosis not present

## 2019-08-17 DIAGNOSIS — I129 Hypertensive chronic kidney disease with stage 1 through stage 4 chronic kidney disease, or unspecified chronic kidney disease: Secondary | ICD-10-CM | POA: Diagnosis not present

## 2019-08-17 DIAGNOSIS — Z79899 Other long term (current) drug therapy: Secondary | ICD-10-CM | POA: Diagnosis not present

## 2019-08-17 DIAGNOSIS — R809 Proteinuria, unspecified: Secondary | ICD-10-CM | POA: Diagnosis not present

## 2019-08-17 LAB — SURGICAL PATHOLOGY

## 2019-08-20 ENCOUNTER — Other Ambulatory Visit (INDEPENDENT_AMBULATORY_CARE_PROVIDER_SITE_OTHER): Payer: Medicare Other

## 2019-08-20 ENCOUNTER — Other Ambulatory Visit: Payer: Self-pay

## 2019-08-20 DIAGNOSIS — E034 Atrophy of thyroid (acquired): Secondary | ICD-10-CM

## 2019-08-20 LAB — TSH: TSH: 4.92 u[IU]/mL — ABNORMAL HIGH (ref 0.35–4.50)

## 2019-08-20 LAB — T4, FREE: Free T4: 1.02 ng/dL (ref 0.60–1.60)

## 2019-08-22 ENCOUNTER — Telehealth: Payer: Self-pay | Admitting: Internal Medicine

## 2019-08-22 DIAGNOSIS — E034 Atrophy of thyroid (acquired): Secondary | ICD-10-CM

## 2019-08-22 NOTE — Telephone Encounter (Signed)
Melissa, can you please call pt: Thyroid tests are much better!!  TSH is only slightly above the upper limit of normal now, but I would like to repeat this in 1.5 months to see if she needs an increase in LT4 dose afterwards.  Can you please order TSH and a free T4 for then?

## 2019-08-22 NOTE — Telephone Encounter (Signed)
Patient requests to be called at ph# [phone] or [phone]  re: Patient had her labs done and would like to be advised about her medications for thyroid (e.g. possible dosage change).

## 2019-08-23 NOTE — Telephone Encounter (Signed)
Notified patient of message from Dr. Gherghe, patient expressed understanding and agreement. No further questions.  

## 2019-08-31 ENCOUNTER — Encounter: Payer: Self-pay | Admitting: *Deleted

## 2019-09-05 ENCOUNTER — Ambulatory Visit (INDEPENDENT_AMBULATORY_CARE_PROVIDER_SITE_OTHER): Payer: Medicare Other | Admitting: *Deleted

## 2019-09-05 DIAGNOSIS — E1121 Type 2 diabetes mellitus with diabetic nephropathy: Secondary | ICD-10-CM

## 2019-09-05 DIAGNOSIS — E1159 Type 2 diabetes mellitus with other circulatory complications: Secondary | ICD-10-CM

## 2019-09-05 DIAGNOSIS — E034 Atrophy of thyroid (acquired): Secondary | ICD-10-CM

## 2019-09-05 DIAGNOSIS — I10 Essential (primary) hypertension: Secondary | ICD-10-CM

## 2019-09-05 NOTE — Patient Instructions (Signed)
Visit Information  Goals Addressed            This Visit's Progress     Patient Stated   . Chronic Disease Management Needs (pt-stated)       CARE PLAN ENTRY (see longtitudinal plan of care for additional care plan information)  Current Barriers:  . Chronic Disease Management support, education, and care coordination needs related to HTN, DM, hypothyroidism, HLD, CKD, osteoporosis  Clinical Goal(s) related to HTN, DM, hypothyroidism, HLD, CKD, osteoporosis:  Over the next 90 days, patient will:  . Work with the care management team to address educational, disease management, and care coordination needs  . Begin or continue self health monitoring activities as directed today Measure and record cbg (blood glucose) 1 times daily and Measure and record blood pressure 3-4 times per week . Call provider office for new or worsened signs and symptoms Blood glucose findings outside established parameters and Blood pressure findings outside established parameters . Call care management team with questions or concerns . Verbalize basic understanding of patient centered plan of care established today  Interventions related to HTN, DM, hypothyroidism, HLD, CKD, osteoporosis:  . Chart reviewed . Evaluation of current treatment plans and patient's adherence to plan as established by provider . Discussed diabetes and hypothyroidism management by endocrinologist . Assessed patient understanding of disease states . Assessed patient's education and care coordination needs . Provided disease specific education to patient  . Collaborated with appropriate clinical care team members regarding patient needs . Provided with CCM contact information and encouraged to reach out as needed  Patient Self Care Activities related to HTN, DM, hypothyroidism, HLD, CKD, osteoporosis:  . Patient is able to independently perform ADLs and IADLs  Initial goal documentation         Ms. Rauda was given  information about Chronic Care Management services today including:  1. CCM service includes personalized support from designated clinical staff supervised by her physician, including individualized plan of care and coordination with other care providers 2. 24/7 contact phone numbers for assistance for urgent and routine care needs. 3. Service will only be billed when office clinical staff spend 20 minutes or more in a month to coordinate care. 4. Only one practitioner may furnish and bill the service in a calendar month. 5. The patient may stop CCM services at any time (effective at the end of the month) by phone call to the office staff. 6. The patient will be responsible for cost sharing (co-pay) of up to 20% of the service fee (after annual deductible is met).  Patient agreed to services and verbal consent obtained.   The patient verbalized understanding of instructions provided today and declined a print copy of patient instruction materials.   The care management team will reach out to the patient again over the next 90 days.   Chong Sicilian, BSN, RN-BC Embedded Chronic Care Manager [facility] [place] [facility] Direct Dial: [phone]

## 2019-09-05 NOTE — Chronic Care Management (AMB) (Signed)
Chronic Care Management   Initial Visit Note  09/05/2019 Name: Lisa Sutton MRN: [id number] DOB: Dec 24, 1933  Referred by: Baruch Gouty, FNP Reason for referral : Chronic Care Management (Initial Visit)   Lisa Sutton is a 86 y.o. year old female who is a primary care patient of Baruch Gouty, FNP. The CCM team was consulted for assistance with chronic disease management and care coordination needs related to HTN, DM, hypothyroidism, HLD, CKD, osteoporosis.  Review of patient status, including review of consultants reports, relevant laboratory and other test results, and collaboration with appropriate care team members and the patient's provider was performed as part of comprehensive patient evaluation and provision of chronic care management services.    Subjective: I spoke with Lisa Sutton today regarding management of her chronic medical conditions. She feels that things are well managed at this time. She does not have any problems with adequate transportation, medication management, safe housing, or food acquisition. She has good support from friends and family and her son lives with her.   SDOH (Social Determinants of Health) assessments performed: Yes See Care Plan activities for detailed interventions related to SDOH     Objective: Outpatient Encounter Medications as of 09/05/2019  Medication Sig Note  . Accu-Chek Softclix Lancets lancets Check BS BID and PRN   . acetaminophen (TYLENOL) 500 MG tablet Take 1,000 mg by mouth daily as needed for moderate pain or headache.   . Alpha-Lipoic Acid 600 MG TABS Take 600 mg by mouth 2 (two) times a day.   . ALPRAZolam (XANAX) 0.25 MG tablet Take 1 tablet (0.25 mg total) by mouth 2 (two) times daily as needed for anxiety. (Patient taking differently: Take 0.25 mg by mouth at bedtime as needed (sleep.). )   . amLODipine (NORVASC) 5 MG tablet TAKE 1 TABLET BY MOUTH  DAILY (Patient taking differently: Take 5 mg by mouth at bedtime. )    . Ascorbic Acid (VITAMIN C) 1000 MG tablet Take 1,000 mg by mouth at bedtime.   Marland Kitchen atenolol (TENORMIN) 50 MG tablet Take 1 tablet (50 mg total) by mouth daily.   Marland Kitchen b complex vitamins tablet Take 1 tablet by mouth daily.   . Cholecalciferol (VITAMIN D3) 50 MCG (2000 UT) TABS Take 2,000 mg by mouth at bedtime.   . DULoxetine (CYMBALTA) 30 MG capsule Take 1 capsule (30 mg total) by mouth daily. (Patient taking differently: Take 30 mg by mouth at bedtime. )   . famotidine (PEPCID) 20 MG tablet Take 1 tablet (20 mg total) by mouth at bedtime.   Marland Kitchen glipiZIDE (GLUCOTROL) 5 MG tablet Take 1/2 tablet (2.5 mg total) by mouth daily before supper. (Patient taking differently: Take 2.5 mg by mouth See admin instructions. Take 0.5 tablet (2.5 mg) by mouth with a large supper) 07/24/2019: If needed  . glucose blood (ACCU-CHEK AVIVA PLUS) test strip Test BS 2 times daily Dx E11.21   . irbesartan (AVAPRO) 150 MG tablet Take 1 tablet (150 mg total) by mouth daily.   Marland Kitchen levothyroxine (SYNTHROID) 137 MCG tablet Take 1 tablet (137 mcg total) by mouth daily before breakfast.   . Omega-3 Fatty Acids (FISH OIL PO) Take 700 mg by mouth at bedtime.   . pantoprazole (PROTONIX) 40 MG tablet Take 1 tablet (40 mg total) by mouth daily before lunch.   . psyllium (METAMUCIL SMOOTH TEXTURE) 58.6 % powder Take 1 packet by mouth daily. (Patient taking differently: Take 1 packet by mouth daily as needed (regularity). )  03/02/2019: Daily   . rosuvastatin (CRESTOR) 5 MG tablet TAKE 1 TABLET BY MOUTH  DAILY (Patient taking differently: Take 5 mg by mouth at bedtime. )   . vitamin B-12 (CYANOCOBALAMIN) 1000 MCG tablet Take 2,000 mcg by mouth at bedtime. Gummies   . [DISCONTINUED] calcium citrate-vitamin D 200-200 MG-UNIT TABS Take 1 tablet by mouth daily.      No facility-administered encounter medications on file as of 09/05/2019.     BP Readings from Last 3 Encounters:  08/16/19 122/63  07/24/19 134/78  07/16/19 125/72   Lab Results    Component Value Date   HGBA1C 6.1 (A) 06/20/2019   HGBA1C 6.1 02/26/2019   HGBA1C 6.3 (H) 11/09/2018   Lab Results  Component Value Date   MICROALBUR NEGATIVE 05/31/2014   LDLCALC 205 (H) 07/16/2019   CREATININE 1.46 (H) 07/16/2019   Lab Results  Component Value Date   CREATININE 1.46 (H) 07/16/2019   BUN 19 07/16/2019   NA 137 07/16/2019   K 4.7 07/16/2019   CL 104 07/16/2019   CO2 20 07/16/2019    RN Care Plan   . Chronic Disease Management Needs       CARE PLAN ENTRY (see longtitudinal plan of care for additional care plan information)  Current Barriers:  . Chronic Disease Management support, education, and care coordination needs related to HTN, DM, hypothyroidism, HLD, CKD, osteoporosis  Clinical Goal(s) related to HTN, DM, hypothyroidism, HLD, CKD, osteoporosis:  Over the next 90 days, patient will:  . Work with the care management team to address educational, disease management, and care coordination needs  . Begin or continue self health monitoring activities as directed today Measure and record cbg (blood glucose) 1 times daily and Measure and record blood pressure 3-4 times per week . Call provider office for new or worsened signs and symptoms Blood glucose findings outside established parameters and Blood pressure findings outside established parameters . Call care management team with questions or concerns . Verbalize basic understanding of patient centered plan of care established today  Interventions related to HTN, DM, hypothyroidism, HLD, CKD, osteoporosis:  . Chart reviewed . Evaluation of current treatment plans and patient's adherence to plan as established by provider . Discussed diabetes and hypothyroidism management by endocrinologist . Assessed patient understanding of disease states . Assessed patient's education and care coordination needs . Provided disease specific education to patient  . Collaborated with appropriate clinical care team members  regarding patient needs . Provided with CCM contact information and encouraged to reach out as needed  Patient Self Care Activities related to HTN, DM, hypothyroidism, HLD, CKD, osteoporosis:  . Patient is able to independently perform ADLs and IADLs  Initial goal documentation            Follow-up Plan:  The care management team will reach out to the patient again over the next 90 days.   Chong Sicilian, BSN, RN-BC Embedded Chronic Care Manager [facility] [facility] Direct Dial: [phone]

## 2019-09-10 ENCOUNTER — Ambulatory Visit: Payer: Medicare Other

## 2019-09-17 ENCOUNTER — Other Ambulatory Visit (INDEPENDENT_AMBULATORY_CARE_PROVIDER_SITE_OTHER): Payer: Medicare Other

## 2019-09-17 ENCOUNTER — Other Ambulatory Visit: Payer: Self-pay

## 2019-09-17 DIAGNOSIS — E034 Atrophy of thyroid (acquired): Secondary | ICD-10-CM

## 2019-09-17 DIAGNOSIS — Z1231 Encounter for screening mammogram for malignant neoplasm of breast: Secondary | ICD-10-CM | POA: Diagnosis not present

## 2019-09-17 LAB — TSH: TSH: 3.52 u[IU]/mL (ref 0.35–4.50)

## 2019-09-17 LAB — T4, FREE: Free T4: 1.15 ng/dL (ref 0.60–1.60)

## 2019-09-18 ENCOUNTER — Encounter: Payer: Self-pay | Admitting: Internal Medicine

## 2019-09-18 ENCOUNTER — Other Ambulatory Visit: Payer: Self-pay | Admitting: Internal Medicine

## 2019-09-18 ENCOUNTER — Telehealth: Payer: Self-pay | Admitting: Internal Medicine

## 2019-09-18 MED ORDER — LEVOTHYROXINE SODIUM 137 MCG PO TABS: 137.0000 ug | ORAL_TABLET | Freq: Every day | ORAL | 3 refills | Status: DC

## 2019-09-18 NOTE — Telephone Encounter (Signed)
Yes, usually it is implied to continue current practice if results are normal.  We will repeat her test when she comes back in August.

## 2019-09-18 NOTE — Telephone Encounter (Signed)
Patient called regarding her most recent lab results.  She is wondering about her medication dosages, if she still needs medication, and if so can the mediatation be sent to the mail in pharmacy on file?   Call back number is [phone]

## 2019-09-18 NOTE — Telephone Encounter (Signed)
Results normal, pt to continue medication at current dose?

## 2019-10-09 ENCOUNTER — Encounter: Payer: Self-pay | Admitting: Dermatology

## 2019-10-09 ENCOUNTER — Ambulatory Visit: Payer: Medicare Other | Admitting: Dermatology

## 2019-10-09 ENCOUNTER — Other Ambulatory Visit: Payer: Self-pay

## 2019-10-09 DIAGNOSIS — D692 Other nonthrombocytopenic purpura: Secondary | ICD-10-CM | POA: Diagnosis not present

## 2019-10-09 NOTE — Patient Instructions (Addendum)
Lisa Sutton was kind enough to provide photographs of a cluster of ecchymoses on the left lower leg which 2 weeks later have completely resolved.  She thought that it left some brown discoloration but the brown spots are actually flat keratoses which she has elsewhere on her legs and other areas and are completely benign.  There is no current sign of vasculitis or easy bleeding.  For now there is no indication for laboratory evaluation or biopsy but [name] knows if there is recurrence I will be happy to recheck her.

## 2019-10-14 ENCOUNTER — Encounter: Payer: Self-pay | Admitting: Dermatology

## 2019-10-14 NOTE — Progress Notes (Signed)
   Follow-Up Visit   Subjective  Lisa Sutton is a 86 y.o. female who presents for the following: Annual Exam (left lower leg-red spots 2 weeks have gone away today but left red spots- no bleed).  Spots Location: Left lower leg Duration: Several weeks Quality: Resolved Associated Signs/Symptoms: Modifying Factors:  Severity:  Timing: Context:   The following portions of the chart were reviewed this encounter and updated as appropriate: Tobacco  Allergies  Meds  Problems  Med Hx  Surg Hx  Fam Hx      Objective  Well appearing patient in no apparent distress; mood and affect are within normal limits.  A focused examination was performed including Head, neck, arms including fingers and nailbeds, legs. Relevant physical exam findings are noted in the Assessment and Plan.   Assessment & Plan  Other nonthrombocytopenic purpura (HCC) Left Lower Leg - Anterior  Recheck if recurs  Lisa Sutton was kind enough to provide photographs of a cluster of ecchymoses on the left lower leg which 2 weeks later have completely resolved.  She thought that it left some brown discoloration but the brown spots are actually flat keratoses which she has elsewhere on her legs and other areas and are completely benign.  There is no current sign of vasculitis or easy bleeding.  For now there is no indication for laboratory evaluation or biopsy but Lisa Sutton knows if there is recurrence I will be happy to recheck her.

## 2019-10-30 ENCOUNTER — Other Ambulatory Visit: Payer: Self-pay | Admitting: Family Medicine

## 2019-10-31 ENCOUNTER — Other Ambulatory Visit: Payer: Self-pay | Admitting: *Deleted

## 2019-10-31 MED ORDER — AMLODIPINE BESYLATE 5 MG PO TABS: 5.0000 mg | ORAL_TABLET | Freq: Every day | ORAL | 0 refills | Status: DC

## 2019-11-15 ENCOUNTER — Ambulatory Visit: Payer: Medicare Other | Admitting: Nurse Practitioner

## 2019-11-15 ENCOUNTER — Ambulatory Visit: Payer: Medicare Other | Admitting: Family Medicine

## 2019-12-04 ENCOUNTER — Ambulatory Visit: Payer: Medicare Other | Admitting: *Deleted

## 2019-12-04 DIAGNOSIS — E1121 Type 2 diabetes mellitus with diabetic nephropathy: Secondary | ICD-10-CM

## 2019-12-04 NOTE — Chronic Care Management (AMB) (Signed)
  Chronic Care Management   Follow-up Note  12/04/2019 Name: Lisa Sutton MRN: [id number] DOB: 10/18/1933  I reached out to [name] by telephone today. She has changed PCP's to Loletha Grayer, FNP with [facility].  PCP changed in [facility] to Loletha Grayer, [place]. I have removed patient from the [facility] embedded CCM program and I have removed myself from her Care Team.  Follow up plan: CCM enrollment status changed to "previously enrolled" as per patient request on 12/04/19 to discontinue enrollment. Case closed to case management services in primary care home.   Chong Sicilian, BSN, RN-BC Embedded Chronic Care Manager [facility] [facility] Direct Dial: [phone]

## 2019-12-06 ENCOUNTER — Other Ambulatory Visit: Payer: Self-pay | Admitting: *Deleted

## 2019-12-25 ENCOUNTER — Ambulatory Visit: Payer: Medicare Other | Admitting: Internal Medicine

## 2020-01-08 ENCOUNTER — Encounter: Payer: Self-pay | Admitting: Internal Medicine

## 2020-01-09 ENCOUNTER — Encounter: Payer: Self-pay | Admitting: Internal Medicine

## 2020-01-09 ENCOUNTER — Ambulatory Visit: Payer: Medicare Other | Admitting: Internal Medicine

## 2020-01-09 ENCOUNTER — Other Ambulatory Visit: Payer: Self-pay

## 2020-01-09 VITALS — BP 130/70 | HR 63 | Ht 63.0 in | Wt 161.0 lb

## 2020-01-09 DIAGNOSIS — Z8639 Personal history of other endocrine, nutritional and metabolic disease: Secondary | ICD-10-CM

## 2020-01-09 DIAGNOSIS — E034 Atrophy of thyroid (acquired): Secondary | ICD-10-CM | POA: Diagnosis not present

## 2020-01-09 DIAGNOSIS — E1121 Type 2 diabetes mellitus with diabetic nephropathy: Secondary | ICD-10-CM | POA: Diagnosis not present

## 2020-01-09 DIAGNOSIS — E782 Mixed hyperlipidemia: Secondary | ICD-10-CM

## 2020-01-09 LAB — POCT GLYCOSYLATED HEMOGLOBIN (HGB A1C): Hemoglobin A1C: 6.3 % — AB (ref 4.0–5.6)

## 2020-01-09 NOTE — Patient Instructions (Signed)
Please continue: - Glipizide 2.5 mg before larger dinner if you have dessert.  Please continue Levothyroxine 137 mcg daily.  Take the thyroid hormone every day, with water, at least 30 minutes before breakfast, separated by at least 4 hours from: - acid reflux medications - calcium - iron - multivitamins  Please stop Biotin and come back for labs in few days.  Please return in 6 months.

## 2020-01-09 NOTE — Progress Notes (Addendum)
Patient ID: Lisa Sutton, female   DOB: 1933/11/24, 86 y.o.   MRN: [id number]   This visit occurred during the SARS-CoV-2 public health emergency.  Safety protocols were in place, including screening questions prior to the visit, additional usage of staff PPE, and extensive cleaning of exam room while observing appropriate contact time as indicated for disinfecting solutions.   HPI: Lisa Sutton is a 86 y.o.-year-old female returning for follow-up for h/o hypercalcemia, DM2, dx in 2015, non-insulin-dependent, uncontrolled, with complications (CKD, peripheral neuropathy).  Last visit 7 months ago. PCP at [facility].  Reviewed HbA1c levels: Lab Results  Component Value Date   HGBA1C 6.1 (A) 06/20/2019   HGBA1C 6.1 02/26/2019   HGBA1C 6.3 (H) 11/09/2018  02/15/2018: HbA1c 6.4% 10/07/2017: HbA1c 6.2% 06/10/2017: HbA1c 7.0% 01/2017: HbA1c 6.1%   She is on: - Glipizide 2.5 mg before a larger dinner or before a meal with dessert -took it only few times since last visit We tried Metformin ER 500 mg with dinner 05/2018 -she had diarrhea and she stopped She could not afford Januvia.  Pt checks her sugars twice a day per her recall: - am:112-139, 148 >> 128-153, 160 >> 96, 123-152, 200 - 2h after b'fast: 141-252 >> 122-145 >> n/c - before lunch:  n/c >> 84 >> n/c >> 118 >> 110 >> n/c - 2h after lunch:  n/c >> 140s, 170-180 >> n/c - before dinner: 109 >> 90-127 >> n/c - 2h after dinner:  110, 121-184, 195 >> 120-156 - bedtime: 89-126 >> 140-150 >> n/c - nighttime: n/c >> 129, 150 >> n/c Lowest sugar was 73 >> 101 >> 107 >> 110 >> 96; she has hypoglycemia awareness in the 70s. Highest sugar was 200x1 >> 182 >> 185 >> 195 >> 200 (icecream).   Glucometer: Accu-Chek Aviva  Pt's meals are: - Breakfast: 2 boiled eggs + toast + tomato or croissants + am and egg - Lunch: [place] sandwich + lettuce + tomato + mayo - Dinner: green beans, slaw + potatoes + meatloaf   - Snacks: crackers +  PB Diet green tea. In the past, she was going to Silver sneakers 3 times a week, but not during the coronavirus pandemic.  -She sees nephrology for CKD, last BUN/creatinine:  Lab Results  Component Value Date   BUN 19 07/16/2019   BUN 28 (H) 02/26/2019   CREATININE 1.46 (H) 07/16/2019   CREATININE 1.28 (H) 02/26/2019  On irbesartan.  -+ HL; last set of lipids: Lab Results  Component Value Date   CHOL 343 (H) 07/16/2019   HDL 54 07/16/2019   LDLCALC 205 (H) 07/16/2019   LDLDIRECT 99 11/01/2016   TRIG 404 (H) 07/16/2019   CHOLHDL 6.4 (H) 07/16/2019  In the past, she could not tolerate statins because of leg cramps.   PCP recommended Vascepa (given samples) but this was not affordable.  On Crestor 5 mg daily. - last eye exam was in 10/2019: No DR -+ Numbness, burning, tingling in her feet - improving.  Previously on Neurontin, which she stopped due to weight gain; now on B complex, alpha-lipoic acid, and Cymbalta.  Hypothyroidism:  Currently on levothyroxine 137 mcg daily (increased in 07/2019): - in am, at 4 am - fasting - at least 30 min from b'fast - no Ca, Fe, MVI, + PPIs (moved >4h later at last OV) - + on Biotin in B complex - ? Dose - last dose yesterday!  Reviewed her TFTs: Lab Results  Component Value Date  TSH 3.52 09/17/2019   TSH 4.92 (H) 08/20/2019   TSH 20.400 (H) 07/16/2019   TSH 4.680 (H) 02/26/2019   TSH 4.340 11/09/2018   TSH 1.740 06/19/2018   TSH 1.340 02/15/2018   TSH 1.400 10/07/2017   TSH 3.960 06/10/2017   TSH 2.580 01/27/2017   TSH 0.906 09/03/2016   TSH 0.911 04/21/2016   TSH 0.735 09/08/2015   TSH 0.374 (L) 07/30/2015   TSH 0.368 (L) 06/04/2015   TSH 0.208 (L) 05/27/2015   TSH 1.300 03/13/2015   TSH 1.417 11/26/2014   TSH 1.110 10/17/2014   TSH 1.830 09/03/2014   She has a history of secondary (non-PTH mediated) hypercalcemia, due to HCTZ.   This normalized after stopping the medication: Lab Results  Component Value Date   PTH 11  (L) 06/04/2015   PTH Comment 06/04/2015   PTH 11 (L) 05/29/2015   PTH Comment 05/29/2015   PTH 6 (L) 01/13/2015   PTH Comment 01/13/2015   PTH 12 (L) 12/02/2014   PTH 5 (L) 11/26/2014   CALCIUM 8.8 07/16/2019   CALCIUM 8.6 (L) 02/26/2019   CALCIUM 8.9 11/09/2018   CALCIUM 9.3 06/19/2018   CALCIUM 9.4 02/15/2018   CALCIUM 9.8 10/07/2017   CALCIUM 9.6 06/10/2017   CALCIUM 9.4 01/27/2017   CALCIUM 10.7 (H) 01/21/2017   CALCIUM 10.2 11/01/2016   CALCIUM 9.5 09/03/2016   CALCIUM 9.7 04/21/2016   CALCIUM 9.3 09/18/2015   CALCIUM 8.9 09/17/2015   CALCIUM 8.6 (L) 09/16/2015   CALCIUM 9.6 09/08/2015   CALCIUM 10.5 (H) 09/03/2015   CALCIUM 9.3 06/04/2015   CALCIUM CANCELED 05/29/2015   CALCIUM CANCELED 05/27/2015   Vitamin D levels were reviewed and they were normal in the last year: Lab Results  Component Value Date   VD25OH 42.8 02/26/2019   VD25OH 47.0 11/09/2018   VD25OH 42.1 09/27/2018   VD25OH 27.8 (L) 06/19/2018   VD25OH 33.4 02/15/2018   VD25OH 32.6 10/07/2017   VD25OH 38.7 06/10/2017   VD25OH 40.5 01/21/2017   VD25OH 32.9 09/03/2016   VD25OH 63.1 05/27/2015   She increased to 2000 units vitamin D daily.  ROS: Constitutional: no weight gain/no weight loss, no fatigue, no subjective hyperthermia, no subjective hypothermia Eyes: no blurry vision, no xerophthalmia ENT: no sore throat, no nodules palpated in neck, no dysphagia, no odynophagia, no hoarseness Cardiovascular: no CP/no SOB/no palpitations/no leg swelling Respiratory: no cough/no SOB/no wheezing Gastrointestinal: no N/no V/no D/no C/no acid reflux Musculoskeletal: no muscle aches/no joint aches Skin: no rashes, no hair loss Neurological: no tremors/+ numbness/+ tingling/no dizziness  I reviewed pt's medications, allergies, PMH, social hx, family hx, and changes were documented in the history of present illness. Otherwise, unchanged from my initial visit note.   Past Medical History:  Diagnosis Date   . Anxiety   . Arthritis   . Cataract   . Dysphagia    'sometimes but not a major issue' been checked out by GI (per pt)  . Dysrhythmia    'heart used to skip but doesn't anymore' was checked out by Dr. Ron Parker late '90s, everything checked out ok and not had any skipping since (all per pt)  . Essential hypertension, benign   . Fatty liver   . GERD (gastroesophageal reflux disease)   . H/O hypercalcemia 04/12/2017  . Hepatitis B    had at age 10, 'GI doc said it's gone away'  . Hepatitis B surface antigen positive   . High cholesterol   . History of hiatal hernia   .  Hypercalcemia   . Hypothyroidism   . Kidney stone August 2014   Patient was [facility]  . Obesity   . Osteoporosis   . PONV (postoperative nausea and vomiting)   . Type 2 diabetes mellitus ([place])    Past Surgical History:  Procedure Laterality Date  . ABDOMINAL HYSTERECTOMY    . BILATERAL CARPAL TUNNEL RELEASE     Dr. Marlou Sa at surgical center  . BIOPSY  05/26/2017   Procedure: BIOPSY;  Surgeon: Rogene Houston, MD;  [facility];  Service: Endoscopy;;  colon  . BIOPSY  10/11/2018   Procedure: BIOPSY;  Surgeon: Rogene Houston, MD;  [facility];  Service: Endoscopy;;  . CATARACT EXTRACTION, BILATERAL    . COLONOSCOPY N/A 05/26/2017   Procedure: COLONOSCOPY;  Surgeon: Rogene Houston, MD;  [facility];  Service: Endoscopy;  Laterality: N/A;  10:30  . Deviated septum repair    . ESOPHAGEAL DILATION N/A 08/16/2019   Procedure: ESOPHAGEAL DILATION;  Surgeon: Rogene Houston, MD;  [facility];  Service: Endoscopy;  Laterality: N/A;  . ESOPHAGOGASTRODUODENOSCOPY N/A 07/28/2016   Procedure: ESOPHAGOGASTRODUODENOSCOPY (EGD);  Surgeon: Rogene Houston, MD;  [facility];  Service: Endoscopy;  Laterality: N/A;  300 - moved to 3/7 @ 1:00  . ESOPHAGOGASTRODUODENOSCOPY N/A 08/16/2019   Procedure: ESOPHAGOGASTRODUODENOSCOPY (EGD);  Surgeon: Rogene Houston, MD;  [facility]  [facility];  Service: Endoscopy;  Laterality: N/A;  230  . EYE SURGERY    . FLEXIBLE SIGMOIDOSCOPY N/A 10/11/2018   Procedure: FLEXIBLE SIGMOIDOSCOPY;  Surgeon: Rogene Houston, MD;  [facility];  Service: Endoscopy;  Laterality: N/A;  unable to complete colonoscopy   . KNEE ARTHROPLASTY Left 09/15/2015   Procedure: COMPUTER ASSISTED TOTAL KNEE ARTHROPLASTY;  Surgeon: Marybelle Killings, MD;  Location: [place];  Service: Orthopedics;  Laterality: Left;  . KNEE ARTHROSCOPY     left knee 2003   Social History   Socioeconomic History  . Marital status: Widowed    Spouse name: Not on file  . Number of children: 1  . Years of education: Not on file  . Highest education level: High school graduate  Occupational History    Comment: retired  Tobacco Use  . Smoking status: Never Smoker  . Smokeless tobacco: Never Used  Vaping Use  . Vaping Use: Never used  Substance and Sexual Activity  . Alcohol use: No    Alcohol/week: 0.0 standard drinks  . Drug use: No  . Sexual activity: Never  Other Topics Concern  . Not on file  Social History Narrative   07/04/19 Lives with son   Caffeine- coffee 1 c daily   Social Determinants of Health   Financial Resource Strain: Low Risk   . Difficulty of Paying Living Expenses: Not hard at all  Food Insecurity: No Food Insecurity  . Worried About Charity fundraiser in the Last Year: Never true  . Ran Out of Food in the Last Year: Never true  Transportation Needs: No Transportation Needs  . Lack of Transportation (Medical): No  . Lack of Transportation (Non-Medical): No  Physical Activity: Sufficiently Active  . Days of Exercise per Week: 3 days  . Minutes of Exercise per Session: 60 min  Stress: No Stress Concern Present  . Feeling of Stress : Not at all  Social Connections: Moderately Integrated  . Frequency of Communication with Friends and Family: More than three times a week  .  Frequency of Social Gatherings with Friends and Family:  More than three times a week  . Attends Religious Services: More than 4 times per year  . Active Member of Clubs or Organizations: No  . Attends Archivist Meetings: More than 4 times per year  . Marital Status: Widowed  Intimate Partner Violence: Not At Risk  . Fear of Current or Ex-Partner: No  . Emotionally Abused: No  . Physically Abused: No  . Sexually Abused: No   Current Outpatient Medications on File Prior to Visit  Medication Sig Dispense Refill  . Accu-Chek Softclix Lancets lancets Check BS BID and PRN 200 each 3  . acetaminophen (TYLENOL) 500 MG tablet Take 1,000 mg by mouth daily as needed for moderate pain or headache.    . Alpha-Lipoic Acid 600 MG TABS Take 600 mg by mouth 2 (two) times a day.    . ALPRAZolam (XANAX) 0.25 MG tablet Take 1 tablet (0.25 mg total) by mouth 2 (two) times daily as needed for anxiety. (Patient taking differently: Take 0.25 mg by mouth at bedtime as needed (sleep.). ) 180 tablet 1  . amLODipine (NORVASC) 5 MG tablet Take 1 tablet (5 mg total) by mouth daily. 90 tablet 0  . Ascorbic Acid (VITAMIN C) 1000 MG tablet Take 1,000 mg by mouth at bedtime.    Marland Kitchen atenolol (TENORMIN) 50 MG tablet Take 1 tablet (50 mg total) by mouth daily. 90 tablet 1  . b complex vitamins tablet Take 1 tablet by mouth daily.    . Cholecalciferol (VITAMIN D3) 50 MCG (2000 UT) TABS Take 2,000 mg by mouth at bedtime.    . DULoxetine (CYMBALTA) 30 MG capsule Take 1 capsule (30 mg total) by mouth daily. (Patient taking differently: Take 30 mg by mouth at bedtime. ) 90 capsule 0  . famotidine (PEPCID) 20 MG tablet Take 1 tablet (20 mg total) by mouth at bedtime.    Marland Kitchen glipiZIDE (GLUCOTROL) 5 MG tablet Take 1/2 tablet (2.5 mg total) by mouth daily before supper. (Patient taking differently: Take 2.5 mg by mouth See admin instructions. Take 0.5 tablet (2.5 mg) by mouth with a large supper) 45 tablet 0  . glucose blood (ACCU-CHEK AVIVA PLUS) test strip Test BS 2 times daily Dx  E11.21 200 strip 3  . irbesartan (AVAPRO) 150 MG tablet TAKE 1 TABLET BY MOUTH  DAILY 90 tablet 0  . levothyroxine (SYNTHROID) 137 MCG tablet Take 1 tablet (137 mcg total) by mouth daily before breakfast. 90 tablet 3  . Omega-3 Fatty Acids (FISH OIL PO) Take 700 mg by mouth at bedtime.    . pantoprazole (PROTONIX) 40 MG tablet Take 1 tablet (40 mg total) by mouth daily before lunch. 90 tablet 3  . psyllium (METAMUCIL SMOOTH TEXTURE) 58.6 % powder Take 1 packet by mouth daily. (Patient taking differently: Take 1 packet by mouth daily as needed (regularity). ) 283 g 12  . rosuvastatin (CRESTOR) 5 MG tablet TAKE 1 TABLET BY MOUTH  DAILY (Patient taking differently: Take 5 mg by mouth at bedtime. ) 90 tablet 1  . vitamin B-12 (CYANOCOBALAMIN) 1000 MCG tablet Take 2,000 mcg by mouth at bedtime. Gummies    . [DISCONTINUED] calcium citrate-vitamin D 200-200 MG-UNIT TABS Take 1 tablet by mouth daily.       No current facility-administered medications on file prior to visit.   Allergies  Allergen Reactions  . Statins Other (See Comments)    'discomfort, aching everywhere' Tolerates to livalo   .  Zetia [Ezetimibe] Other (See Comments)    Leg pain  . Zocor [Simvastatin] Other (See Comments)    Leg pain   Family History  Problem Relation Age of Onset  . Hypertension Father   . Hip fracture Father   . COPD Mother   . Heart attack Mother   . Cancer Maternal Aunt   . Diabetes Maternal Aunt   . Diabetes Maternal Uncle   . Colon cancer Neg Hx     PE: BP 130/70   Pulse 63   Ht 5\' 3"  (1.6 m)   Wt 161 lb (73 kg)   SpO2 94%   BMI 28.52 kg/m  Wt Readings from Last 3 Encounters:  01/09/20 161 lb (73 kg)  07/24/19 164 lb 4.8 oz (74.5 kg)  07/16/19 164 lb (74.4 kg)   Constitutional: overweight, in NAD Eyes: PERRLA, EOMI, no exophthalmos ENT: moist mucous membranes, no thyromegaly, no cervical lymphadenopathy Cardiovascular: RRR, No MRG Respiratory: CTA B Gastrointestinal: abdomen soft, NT,  ND, BS+ Musculoskeletal: no deformities, strength intact in all 4 Skin: moist, warm, no rashes Neurological: no tremor with outstretched hands, DTR normal in all 4  ASSESSMENT: 1. DM2, non-insulin-dependent, uncontrolled, with complications - CKD - PN  2. H/o Hypercalcemia  3. Hypothyroidism  4. HL  PLAN:  1. Patient with history of controlled diabetes, on low-dose glipizide only with larger meals.  In the past, she was on Metformin but we had to stop due to side effects (diarrhea).  At last visit, HbA1c was excellent, stable, at 6.1%.  However, sugars appeared at or slightly above the goal in the 2 weeks prior to our appointment, both in a.m. and after dinner.  Upon questioning, she was taking glipizide at the start of her meal and I advised her to take this 15-30 minutes before the meal. -At this visit, sugars are at goal after dinner, but they are slightly above target in the morning, especially after larger meals or if she has desserts.  Upon questioning, she is not always using glipizide before these meals and I advised her to start doing so.  I do not feel she needs other changes in her regimen for now. - I suggested to:  Patient Instructions  Please continue: - Glipizide 2.5 mg before larger dinner if you have dessert.  Please continue Levothyroxine 137 mcg daily.  Take the thyroid hormone every day, with water, at least 30 minutes before breakfast, separated by at least 4 hours from: - acid reflux medications - calcium - iron - multivitamins  Please stop at the lab.  Please return in 6 months.  - we checked her HbA1c: 6.3% (slightly higher) - advised to check sugars at different times of the day - 1x a day, rotating check times - advised for yearly eye exams >> she is UTD - return to clinic in 6 months    2. H/o Hypercalcemia -Mild, resolved, latest calcium normal: Lab Results  Component Value Date   CALCIUM 8.8 07/16/2019  -PTH not elevated -Latest vitamin D  level was normal in 02/2019, on 1000 units vitamin D daily -This was probably due to HCTZ-he is now off the medication  3. Hypothyroidism - latest thyroid labs reviewed with pt >> normal: Lab Results  Component Value Date   TSH 3.52 09/17/2019   - she continues on LT4 137 mcg daily - pt feels good on this dose, except for hot flushes and she wonders whether her levothyroxine dose is too high - we discussed about  taking the thyroid hormone every day, with water, >30 minutes before breakfast, separated by >4 hours from acid reflux medications, calcium, iron, multivitamins. Pt. is taking it correctly now, at last visit she was taking Protonix along with levothyroxine and we moved the PPI later in the day.  - will check thyroid tests after stopping biotin for several days. - If labs are abnormal, she will need to return for repeat TFTs in 1.5 months  4. HL -Reviewed latest lipid panel from 06/2019: Significantly increased LDL, from 145 in 02/2021 to 205 in 06/2019.  Triglycerides are also high: Lab Results  Component Value Date   CHOL 343 (H) 07/16/2019   HDL 54 07/16/2019   LDLCALC 205 (H) 07/16/2019   LDLDIRECT 99 11/01/2016   TRIG 404 (H) 07/16/2019   CHOLHDL 6.4 (H) 07/16/2019  -She was intolerant to statins in the past but she then appeared to tolerate Crestor 5 mg well.  This was started after her lipid panel obtained on 02/2019.  Orders Placed This Encounter  Procedures  . TSH  . T4, free   Component     Latest Ref Rng & Units 01/15/2020  TSH     0.35 - 4.50 uIU/mL 1.06  T4,Free(Direct)     0.60 - 1.60 ng/dL 1.21  Normal TFTs. Philemon Kingdom, MD PhD [facility]

## 2020-01-13 ENCOUNTER — Other Ambulatory Visit: Payer: Self-pay | Admitting: Family Medicine

## 2020-01-15 ENCOUNTER — Other Ambulatory Visit: Payer: Self-pay

## 2020-01-15 ENCOUNTER — Other Ambulatory Visit (INDEPENDENT_AMBULATORY_CARE_PROVIDER_SITE_OTHER): Payer: Medicare Other

## 2020-01-15 DIAGNOSIS — E034 Atrophy of thyroid (acquired): Secondary | ICD-10-CM

## 2020-01-15 LAB — TSH: TSH: 1.06 u[IU]/mL (ref 0.35–4.50)

## 2020-01-15 LAB — T4, FREE: Free T4: 1.21 ng/dL (ref 0.60–1.60)

## 2020-01-30 ENCOUNTER — Other Ambulatory Visit: Payer: Self-pay | Admitting: Family Medicine

## 2020-02-05 ENCOUNTER — Encounter (INDEPENDENT_AMBULATORY_CARE_PROVIDER_SITE_OTHER): Payer: Self-pay | Admitting: Internal Medicine

## 2020-02-05 ENCOUNTER — Other Ambulatory Visit: Payer: Self-pay

## 2020-02-05 ENCOUNTER — Ambulatory Visit (INDEPENDENT_AMBULATORY_CARE_PROVIDER_SITE_OTHER): Payer: Medicare Other | Admitting: Internal Medicine

## 2020-02-05 VITALS — BP 123/80 | HR 59 | Temp 97.9°F | Ht 63.0 in | Wt 160.4 lb

## 2020-02-05 DIAGNOSIS — R748 Abnormal levels of other serum enzymes: Secondary | ICD-10-CM

## 2020-02-05 DIAGNOSIS — Z8619 Personal history of other infectious and parasitic diseases: Secondary | ICD-10-CM | POA: Diagnosis not present

## 2020-02-05 DIAGNOSIS — K529 Noninfective gastroenteritis and colitis, unspecified: Secondary | ICD-10-CM

## 2020-02-05 DIAGNOSIS — K219 Gastro-esophageal reflux disease without esophagitis: Secondary | ICD-10-CM

## 2020-02-05 NOTE — Patient Instructions (Signed)
Take 1 heaping tablespoonful of Metamucil every night.  Can take Imodium OTC 2 mg on days when you travel. Physician will call with results of blood test and further recommendations.

## 2020-02-05 NOTE — Progress Notes (Signed)
Presenting complaint;  Follow for chronic diarrhea GERD and elevated serum alkaline phosphatase.  Database and subjective:  Patient is 86 year old Caucasian female who has chronic GERD complicated by distal esophageal stricture which was last dilated in March this year.  She also has history of chronic diarrhea and underwent colonoscopy in May 2020.  She was felt to have diarrhea due to IBS.  While she did not respond to antidiarrheals she did very well with Metamucil.  She also has history of hepatitis B surface antigen positivity.  On evaluation about 10 years ago she was felt to be an active carrier.  While hepatitis B surface antigen was positive HBV DNA was undetectable.  Few years ago hepatitis B surface antigen also became undetectable.  She has not required any therapy. She seroconverted few years ago and HBsAg became undetectable.  She says she is doing well as far as GERD symptoms are concerned.  She has heartburn with certain foods.  She had one episode of dysphagia since her last visit.  She believes she was either eating an apple or carrots.  She tries to chew her food thoroughly and eat slowly.  Her appetite is good and her weight is stable. She has noted increased frequency of defecation.  She is having 2-3 bowel movements per day.  She is not passing much stool each time.  She is taking 20 spoonfuls of Metamucil.  She is not having melena or rectal bleeding or abdominal pain.  She has not had any accidents recently.  She has had them in the past. She was noted to have elevated serum alkaline phosphatase in October 2020 and more recent February 2021 with upward trend.  Transaminases were normal. She says she is very active.  [facility]  She was going more often but they have cut back because of resurgence of Covid pandemic.   Current Medications: Outpatient Encounter Medications as of 02/05/2020  Medication Sig  . Accu-Chek Softclix Lancets lancets Check BS  BID and PRN  . acetaminophen (TYLENOL) 500 MG tablet Take 1,000 mg by mouth daily as needed for moderate pain or headache.  . Alpha-Lipoic Acid 600 MG TABS Take 600 mg by mouth 2 (two) times a day.  . ALPRAZolam (XANAX) 0.25 MG tablet Take 1 tablet (0.25 mg total) by mouth 2 (two) times daily as needed for anxiety. (Patient taking differently: Take 0.25 mg by mouth at bedtime as needed (sleep.). )  . amLODipine (NORVASC) 5 MG tablet Take 1 tablet (5 mg total) by mouth daily.  . Ascorbic Acid (VITAMIN C) 1000 MG tablet Take 1,000 mg by mouth at bedtime.  Marland Kitchen atenolol (TENORMIN) 50 MG tablet Take 1 tablet (50 mg total) by mouth daily.  Marland Kitchen b complex vitamins tablet Take 1 tablet by mouth daily.  . Cholecalciferol (VITAMIN D3) 50 MCG (2000 UT) TABS Take 2,000 mg by mouth at bedtime.  . DULoxetine (CYMBALTA) 30 MG capsule Take 1 capsule (30 mg total) by mouth daily. (Patient taking differently: Take 30 mg by mouth at bedtime. )  . famotidine (PEPCID) 20 MG tablet Take 1 tablet (20 mg total) by mouth at bedtime.  Marland Kitchen glipiZIDE (GLUCOTROL) 5 MG tablet Take 1/2 tablet (2.5 mg total) by mouth daily before supper. (Patient taking differently: Take 2.5 mg by mouth See admin instructions. Take 0.5 tablet (2.5 mg) by mouth with a large supper)  . glucose blood (ACCU-CHEK AVIVA PLUS) test strip Test BS 2 times daily Dx  E11.21  . irbesartan (AVAPRO) 150 MG tablet TAKE 1 TABLET BY MOUTH  DAILY  . levothyroxine (SYNTHROID) 137 MCG tablet Take 1 tablet (137 mcg total) by mouth daily before breakfast.  . pantoprazole (PROTONIX) 40 MG tablet Take 1 tablet (40 mg total) by mouth daily before lunch.  . psyllium (METAMUCIL SMOOTH TEXTURE) 58.6 % powder Take 1 packet by mouth daily. (Patient taking differently: Take 1 packet by mouth daily as needed (regularity). )  . rosuvastatin (CRESTOR) 5 MG tablet TAKE 1 TABLET BY MOUTH  DAILY (Patient taking differently: Take 5 mg by mouth at bedtime. )  . vitamin B-12 (CYANOCOBALAMIN)  1000 MCG tablet Take 2,000 mcg by mouth at bedtime. Gummies  . Omega-3 Fatty Acids (FISH OIL PO) Take 700 mg by mouth at bedtime. (Patient not taking: Reported on 02/05/2020)  . [DISCONTINUED] calcium citrate-vitamin D 200-200 MG-UNIT TABS Take 1 tablet by mouth daily.     No facility-administered encounter medications on file as of 02/05/2020.     Objective: Blood pressure 123/80, pulse (!) 59, temperature 97.9 F (36.6 C), temperature source Oral, height 5' 3"  (1.6 m), weight 160 lb 6.4 oz (72.8 kg). Patient is alert and in no acute distress. She is wearing a mask. Conjunctiva is pink. Sclera is nonicteric Oropharyngeal mucosa is normal. No neck masses or thyromegaly noted. Cardiac exam with regular rhythm normal S1 and S2. No murmur or gallop noted. Lungs are clear to auscultation. Abdomen is full but soft and nontender with no organomegaly or masses. No LE edema or clubbing noted.  Labs/studies Results:  CBC Latest Ref Rng & Units 07/16/2019 02/26/2019 11/09/2018  WBC 3.4 - 10.8 x10E3/uL 7.3 8.1 7.7  Hemoglobin 11.1 - 15.9 g/dL 14.7 13.9 14.3  Hematocrit 34.0 - 46.6 % 42.5 41.1 42.2  Platelets 150 - 450 x10E3/uL 203 186 204    CMP Latest Ref Rng & Units 07/16/2019 02/26/2019 02/13/2019  Glucose 65 - 99 mg/dL 166(H) 147(H) -  BUN 8 - 27 mg/dL 19 28(H) -  Creatinine 0.57 - 1.00 mg/dL 1.46(H) 1.28(H) -  Sodium 134 - 144 mmol/L 137 139 -  Potassium 3.5 - 5.2 mmol/L 4.7 4.8 -  Chloride 96 - 106 mmol/L 104 103 -  CO2 20 - 29 mmol/L 20 22 -  Calcium 8.7 - 10.3 mg/dL 8.8 8.6(L) -  Total Protein 6.0 - 8.5 g/dL 6.1 6.0 6.3  Total Bilirubin 0.0 - 1.2 mg/dL 0.2 0.3 0.3  Alkaline Phos 39 - 117 IU/L 205(H) 153(H) -  AST 0 - 40 IU/L 24 26 20   ALT 0 - 32 IU/L 18 23 17     Hepatic Function Latest Ref Rng & Units 07/16/2019 02/26/2019 02/13/2019  Total Protein 6.0 - 8.5 g/dL 6.1 6.0 6.3  Albumin 3.6 - 4.6 g/dL 3.5(L) 3.5(L) -  AST 0 - 40 IU/L 24 26 20   ALT 0 - 32 IU/L 18 23 17   Alk Phosphatase  39 - 117 IU/L 205(H) 153(H) -  Total Bilirubin 0.0 - 1.2 mg/dL 0.2 0.3 0.3  Bilirubin, Direct 0.0 - 0.2 mg/dL - - 0.1     Assessment:  #1.  Chronic GERD complicated by distal esophageal stricture which was dilated in March 2021.  Heartburn is well controlled with the PPI.  She did not do well off PPI.  She had single episode of dysphagia since her last visit.  Will monitor.  #2.  Chronic diarrhea felt to be due to IBS.  She may benefit from increasing Metamucil dose to  these 4 g daily.  #3.  Elevated serum alkaline phosphatase with normal transaminases.  She has history of being an active HBsAg carrier.  She has spontaneously cleared.  She needs to be reevaluated.  If alkaline phosphatase remains elevated would consider abdominal ultrasound.  Plan:  Patient will go to the lab for LFTs and HBV DNA by PCR. Increase Metamucil dose to 1 heaping tablespoonful daily or at least 4 g by mouth daily at bedtime. Can take Imodium OTC 2 mg on days when she has to be out of the house. Office visit in 6 months.

## 2020-02-08 LAB — HEPATIC FUNCTION PANEL
AG Ratio: 1.4 (calc) (ref 1.0–2.5)
ALT: 10 U/L (ref 6–29)
AST: 18 U/L (ref 10–35)
Albumin: 3.8 g/dL (ref 3.6–5.1)
Alkaline phosphatase (APISO): 80 U/L (ref 37–153)
Bilirubin, Direct: 0.1 mg/dL (ref 0.0–0.2)
Globulin: 2.8 g/dL (calc) (ref 1.9–3.7)
Indirect Bilirubin: 0.3 mg/dL (calc) (ref 0.2–1.2)
Total Bilirubin: 0.4 mg/dL (ref 0.2–1.2)
Total Protein: 6.6 g/dL (ref 6.1–8.1)

## 2020-02-08 LAB — HEPATITIS B DNA, ULTRAQUANTITATIVE, PCR
Hepatitis B DNA (Calc): <1 Log IU/mL
Hepatitis B DNA: <10 IU/mL

## 2020-04-22 ENCOUNTER — Other Ambulatory Visit: Payer: Self-pay | Admitting: Internal Medicine

## 2020-07-10 ENCOUNTER — Other Ambulatory Visit: Payer: Self-pay

## 2020-07-10 ENCOUNTER — Encounter: Payer: Self-pay | Admitting: Internal Medicine

## 2020-07-10 ENCOUNTER — Ambulatory Visit: Payer: Medicare Other | Admitting: Internal Medicine

## 2020-07-10 VITALS — BP 130/88 | HR 56 | Ht 63.0 in | Wt 158.2 lb

## 2020-07-10 DIAGNOSIS — E1121 Type 2 diabetes mellitus with diabetic nephropathy: Secondary | ICD-10-CM

## 2020-07-10 DIAGNOSIS — Z8639 Personal history of other endocrine, nutritional and metabolic disease: Secondary | ICD-10-CM

## 2020-07-10 DIAGNOSIS — E034 Atrophy of thyroid (acquired): Secondary | ICD-10-CM

## 2020-07-10 DIAGNOSIS — E782 Mixed hyperlipidemia: Secondary | ICD-10-CM | POA: Diagnosis not present

## 2020-07-10 LAB — POCT GLYCOSYLATED HEMOGLOBIN (HGB A1C): Hemoglobin A1C: 6.7 % — AB (ref 4.0–5.6)

## 2020-07-10 NOTE — Progress Notes (Signed)
Patient ID: Lisa Sutton, female   DOB: September 05, 1933, 87 y.o.   MRN: [id number]   This visit occurred during the SARS-CoV-2 public health emergency.  Safety protocols were in place, including screening questions prior to the visit, additional usage of staff PPE, and extensive cleaning of exam room while observing appropriate contact time as indicated for disinfecting solutions.   HPI: Lisa Sutton is a 87 y.o.-year-old female returning for follow-up for h/o hypercalcemia, DM2, dx in 2015, non-insulin-dependent, uncontrolled, with complications (CKD, peripheral neuropathy).  Last visit 6 months ago. PCP at [facility].  Since last visit, her kidney function worsened.  Her nephrologist wanted to check the kidney biopsy, but she refused.  Reviewed HbA1c levels: Lab Results  Component Value Date   HGBA1C 6.3 (A) 01/09/2020   HGBA1C 6.1 (A) 06/20/2019   HGBA1C 6.1 02/26/2019  02/15/2018: HbA1c 6.4% 10/07/2017: HbA1c 6.2% 06/10/2017: HbA1c 7.0% 01/2017: HbA1c 6.1%   She is on: - Glipizide 2.5 mg before a larger dinner or before a meal with dessert  We tried Metformin ER 500 mg with dinner 05/2018 -she had diarrhea and she stopped She could not afford Januvia.  Pt checks her sugars twice a day: - am:112-139, 148 >> 128-153, 160 >> 96, 123-152, 200 >> 114-144 - 2h after b'fast: 141-252 >> 122-145 >> n/c - before lunch:  n/c >> 84 >> n/c >> 118 >> 110 >> n/c - 2h after lunch:  n/c >> 140s, 170-180 >> n/c - before dinner: 109 >> 90-127 >> n/c >> 93 - 2h after dinner:  110, 121-184, 195 >> 120-156 >> 130-160, 210 - bedtime: 89-126 >> 140-150 >> n/c - nighttime: n/c >> 129, 150 >> n/c Lowest sugar was 73 >> ...96 >> 93; she has hypoglycemia awareness in the 70s. Highest sugar was 200 (icecream) >> 210.   Glucometer: Accu-Chek Aviva  Pt's meals are: - Breakfast: 2 boiled eggs + toast + tomato or croissants + am and egg - Lunch: [place] sandwich + lettuce + tomato + mayo - Dinner:  green beans, slaw + potatoes + meatloaf   - Snacks: crackers + PB Diet green tea. In the past, she was going to [employer] 3 times a week, but not during the coronavirus pandemic.  -+ CKD with nephrotic range proteinuria-sees nephrology (Dr. Theador Hawthorne), last BUN/creatinine:  06/05/2020: BUN/creatinine 29/1.54, GFR 31, protein/creatinine ratio 10,079 Lab Results  Component Value Date   BUN 19 07/16/2019   BUN 28 (H) 02/26/2019   CREATININE 1.46 (H) 07/16/2019   CREATININE 1.28 (H) 02/26/2019  On irbesartan.  -+ HL; last set of lipids: Lab Results  Component Value Date   CHOL 343 (H) 07/16/2019   HDL 54 07/16/2019   LDLCALC 205 (H) 07/16/2019   LDLDIRECT 99 11/01/2016   TRIG 404 (H) 07/16/2019   CHOLHDL 6.4 (H) 07/16/2019  She could not tolerate statins in the past due to muscle cramps.   PCP recommended Vascepa (given samples) but this was not affordable.  On Crestor 5 mg daily.   - last eye exam was in 10/2019: No DR -She has improving numbness, burning, tingling in her feet.  Previously on Neurontin, which she stopped due to weight gain; now on B complex, alpha lipoic acid, and Cymbalta.  Hypothyroidism:  She is on levothyroxine 137 mcg daily (increased in 07/2019): - in am - fasting - at least 30 min from b'fast - no calcium - no iron - no multivitamins - + PPIs later in the day - +  Biotin in B complex  Reviewed her TFTs: Lab Results  Component Value Date   TSH 1.06 01/15/2020   TSH 3.52 09/17/2019   TSH 4.92 (H) 08/20/2019   TSH 20.400 (H) 07/16/2019   TSH 4.680 (H) 02/26/2019   TSH 4.340 11/09/2018   TSH 1.740 06/19/2018   TSH 1.340 02/15/2018   TSH 1.400 10/07/2017   TSH 3.960 06/10/2017   TSH 2.580 01/27/2017   TSH 0.906 09/03/2016   TSH 0.911 04/21/2016   TSH 0.735 09/08/2015   TSH 0.374 (L) 07/30/2015   TSH 0.368 (L) 06/04/2015   TSH 0.208 (L) 05/27/2015   TSH 1.300 03/13/2015   TSH 1.417 11/26/2014   TSH 1.110 10/17/2014   She has a history  of secondary (non-PTH mediated) hypercalcemia, due to HCTZ.   This normalized after stopping HCTZ was recently high again 2/2 CKD: 06/05/2020: Ca 10.7, Phos 6 03/06/2020: calcium 8.8, PTH 37 Lab Results  Component Value Date   PTH 11 (L) 06/04/2015   PTH Comment 06/04/2015   PTH 11 (L) 05/29/2015   PTH Comment 05/29/2015   PTH 6 (L) 01/13/2015   PTH Comment 01/13/2015   PTH 12 (L) 12/02/2014   PTH 5 (L) 11/26/2014   CALCIUM 8.8 07/16/2019   CALCIUM 8.6 (L) 02/26/2019   CALCIUM 8.9 11/09/2018   CALCIUM 9.3 06/19/2018   CALCIUM 9.4 02/15/2018   CALCIUM 9.8 10/07/2017   CALCIUM 9.6 06/10/2017   CALCIUM 9.4 01/27/2017   CALCIUM 10.7 (H) 01/21/2017   CALCIUM 10.2 11/01/2016   CALCIUM 9.5 09/03/2016   CALCIUM 9.7 04/21/2016   CALCIUM 9.3 09/18/2015   CALCIUM 8.9 09/17/2015   CALCIUM 8.6 (L) 09/16/2015   CALCIUM 9.6 09/08/2015   CALCIUM 10.5 (H) 09/03/2015   CALCIUM 9.3 06/04/2015   CALCIUM CANCELED 05/29/2015   CALCIUM CANCELED 05/27/2015   Vitamin D levels were reviewed: 03/06/2020: Vitamin D 25.2 Lab Results  Component Value Date   VD25OH 42.8 02/26/2019   VD25OH 47.0 11/09/2018   VD25OH 42.1 09/27/2018   VD25OH 27.8 (L) 06/19/2018   VD25OH 33.4 02/15/2018   VD25OH 32.6 10/07/2017   VD25OH 38.7 06/10/2017   VD25OH 40.5 01/21/2017   VD25OH 32.9 09/03/2016   VD25OH 63.1 05/27/2015   She was on 2000 units vitamin D daily - now off -per nephrology.  Of note, she had a negative SPEP.  She is suspected to have FSGS.   ROS: Constitutional: no weight gain/no weight loss, no fatigue, no subjective hyperthermia, no subjective hypothermia Eyes: no blurry vision, no xerophthalmia ENT: no sore throat, no nodules palpated in neck, no dysphagia, no odynophagia, no hoarseness Cardiovascular: no CP/no SOB/no palpitations/no leg swelling Respiratory: no cough/no SOB/no wheezing Gastrointestinal: no N/no V/no D/no C/no acid reflux Musculoskeletal: no muscle aches/no joint  aches Skin: no rashes, no hair loss Neurological: no tremors/+ numbness/+ tingling/no dizziness  I reviewed pt's medications, allergies, PMH, social hx, family hx, and changes were documented in the history of present illness. Otherwise, unchanged from my initial visit note.   Past Medical History:  Diagnosis Date  . Anxiety   . Arthritis   . Cataract   . Dysphagia    'sometimes but not a major issue' been checked out by GI (per pt)  . Dysrhythmia    'heart used to skip but doesn't anymore' was checked out by Dr. Ron Parker late '90s, everything checked out ok and not had any skipping since (all per pt)  . Essential hypertension, benign   . Fatty liver   .  GERD (gastroesophageal reflux disease)   . H/O hypercalcemia 04/12/2017  . Hepatitis B    had at age 65, 'GI doc said it's gone away'  . Hepatitis B surface antigen positive   . High cholesterol   . History of hiatal hernia   . Hypercalcemia   . Hypothyroidism   . Kidney stone August 2014   Patient was [facility] at [facility]  . Obesity   . Osteoporosis   . PONV (postoperative nausea and vomiting)   . Type 2 diabetes mellitus ([place])    Past Surgical History:  Procedure Laterality Date  . ABDOMINAL HYSTERECTOMY    . BILATERAL CARPAL TUNNEL RELEASE     Dr. Marlou Sa at surgical center  . BIOPSY  05/26/2017   Procedure: BIOPSY;  Surgeon: Rogene Houston, MD;  [facility];  Service: Endoscopy;;  colon  . BIOPSY  10/11/2018   Procedure: BIOPSY;  Surgeon: Rogene Houston, MD;  [facility];  Service: Endoscopy;;  . CATARACT EXTRACTION, BILATERAL    . COLONOSCOPY N/A 05/26/2017   Procedure: COLONOSCOPY;  Surgeon: Rogene Houston, MD;  [facility];  Service: Endoscopy;  Laterality: N/A;  10:30  . Deviated septum repair    . ESOPHAGEAL DILATION N/A 08/16/2019   Procedure: ESOPHAGEAL DILATION;  Surgeon: Rogene Houston, MD;  [facility];  Service: Endoscopy;  Laterality: N/A;  .  ESOPHAGOGASTRODUODENOSCOPY N/A 07/28/2016   Procedure: ESOPHAGOGASTRODUODENOSCOPY (EGD);  Surgeon: Rogene Houston, MD;  [facility]: [facility];  Service: Endoscopy;  Laterality: N/A;  300 - moved to 3/7 @ 1:00  . ESOPHAGOGASTRODUODENOSCOPY N/A 08/16/2019   Procedure: ESOPHAGOGASTRODUODENOSCOPY (EGD);  Surgeon: Rogene Houston, MD;  [facility];  Service: Endoscopy;  Laterality: N/A;  230  . EYE SURGERY    . FLEXIBLE SIGMOIDOSCOPY N/A 10/11/2018   Procedure: FLEXIBLE SIGMOIDOSCOPY;  Surgeon: Rogene Houston, MD;  [facility];  Service: Endoscopy;  Laterality: N/A;  unable to complete colonoscopy   . KNEE ARTHROPLASTY Left 09/15/2015   Procedure: COMPUTER ASSISTED TOTAL KNEE ARTHROPLASTY;  Surgeon: Marybelle Killings, MD;  Location: [place];  Service: Orthopedics;  Laterality: Left;  . KNEE ARTHROSCOPY     left knee 2003   Social History   Socioeconomic History  . Marital status: Widowed    Spouse name: Not on file  . Number of children: 1  . Years of education: Not on file  . Highest education level: High school graduate  Occupational History    Comment: retired  Tobacco Use  . Smoking status: Never Smoker  . Smokeless tobacco: Never Used  Vaping Use  . Vaping Use: Never used  Substance and Sexual Activity  . Alcohol use: No    Alcohol/week: 0.0 standard drinks  . Drug use: No  . Sexual activity: Never  Other Topics Concern  . Not on file  Social History Narrative   07/04/19 Lives with son   Caffeine- coffee 1 c daily   Social Determinants of Health   Financial Resource Strain: Low Risk   . Difficulty of Paying Living Expenses: Not hard at all  Food Insecurity: No Food Insecurity  . Worried About Charity fundraiser in the Last Year: Never true  . Ran Out of Food in the Last Year: Never true  Transportation Needs: No Transportation Needs  . Lack of Transportation (Medical): No  . Lack of Transportation (Non-Medical): No  Physical Activity: Sufficiently  Active  . Days  of Exercise per Week: 3 days  . Minutes of Exercise per Session: 60 min  Stress: No Stress Concern Present  . Feeling of Stress : Not at all  Social Connections: Moderately Integrated  . Frequency of Communication with Friends and Family: More than three times a week  . Frequency of Social Gatherings with Friends and Family: More than three times a week  . Attends Religious Services: More than 4 times per year  . Active Member of Clubs or Organizations: No  . Attends Archivist Meetings: More than 4 times per year  . Marital Status: Widowed  Intimate Partner Violence: Not At Risk  . Fear of Current or Ex-Partner: No  . Emotionally Abused: No  . Physically Abused: No  . Sexually Abused: No   Current Outpatient Medications on File Prior to Visit  Medication Sig Dispense Refill  . Accu-Chek Softclix Lancets lancets Check BS BID and PRN 200 each 3  . acetaminophen (TYLENOL) 500 MG tablet Take 1,000 mg by mouth daily as needed for moderate pain or headache.    . Alpha-Lipoic Acid 600 MG TABS Take 600 mg by mouth 2 (two) times a day.    . ALPRAZolam (XANAX) 0.25 MG tablet Take 1 tablet (0.25 mg total) by mouth 2 (two) times daily as needed for anxiety. (Patient taking differently: Take 0.25 mg by mouth at bedtime as needed (sleep.). ) 180 tablet 1  . amLODipine (NORVASC) 5 MG tablet Take 1 tablet (5 mg total) by mouth daily. 90 tablet 0  . Ascorbic Acid (VITAMIN C) 1000 MG tablet Take 1,000 mg by mouth at bedtime.    Marland Kitchen atenolol (TENORMIN) 50 MG tablet Take 1 tablet (50 mg total) by mouth daily. 90 tablet 1  . b complex vitamins tablet Take 1 tablet by mouth daily.    . Cholecalciferol (VITAMIN D3) 50 MCG (2000 UT) TABS Take 2,000 mg by mouth at bedtime.    . DULoxetine (CYMBALTA) 30 MG capsule Take 1 capsule (30 mg total) by mouth daily. (Patient taking differently: Take 30 mg by mouth at bedtime. ) 90 capsule 0  . famotidine (PEPCID) 20 MG tablet Take 1 tablet (20 mg  total) by mouth at bedtime.    Marland Kitchen glipiZIDE (GLUCOTROL) 5 MG tablet TAKE 1/2 TABLET BY MOUTH  DAILY ONLY IF HAVING A  LARGE MEAL AT DINNER 45 tablet 3  . glucose blood (ACCU-CHEK AVIVA PLUS) test strip Test BS 2 times daily Dx E11.21 200 strip 3  . irbesartan (AVAPRO) 150 MG tablet TAKE 1 TABLET BY MOUTH  DAILY 90 tablet 0  . levothyroxine (SYNTHROID) 137 MCG tablet Take 1 tablet (137 mcg total) by mouth daily before breakfast. 90 tablet 3  . pantoprazole (PROTONIX) 40 MG tablet Take 1 tablet (40 mg total) by mouth daily before lunch. 90 tablet 3  . psyllium (METAMUCIL SMOOTH TEXTURE) 58.6 % powder Take 1 packet by mouth daily. (Patient taking differently: Take 1 packet by mouth daily as needed (regularity). ) 283 g 12  . rosuvastatin (CRESTOR) 5 MG tablet TAKE 1 TABLET BY MOUTH  DAILY (Patient taking differently: Take 5 mg by mouth at bedtime. ) 90 tablet 1  . vitamin B-12 (CYANOCOBALAMIN) 1000 MCG tablet Take 2,000 mcg by mouth at bedtime. Gummies    . [DISCONTINUED] calcium citrate-vitamin D 200-200 MG-UNIT TABS Take 1 tablet by mouth daily.       No current facility-administered medications on file prior to visit.   Allergies  Allergen Reactions  .  Statins Other (See Comments)    'discomfort, aching everywhere' Tolerates to livalo   . Zetia [Ezetimibe] Other (See Comments)    Leg pain  . Zocor [Simvastatin] Other (See Comments)    Leg pain   Family History  Problem Relation Age of Onset  . Hypertension Father   . Hip fracture Father   . COPD Mother   . Heart attack Mother   . Cancer Maternal Aunt   . Diabetes Maternal Aunt   . Diabetes Maternal Uncle   . Colon cancer Neg Hx     PE: BP 130/88   Pulse (!) 56   Ht 5\' 3"  (1.6 m)   Wt 158 lb 3.2 oz (71.8 kg)   SpO2 97%   BMI 28.02 kg/m  Wt Readings from Last 3 Encounters:  07/10/20 158 lb 3.2 oz (71.8 kg)  02/05/20 160 lb 6.4 oz (72.8 kg)  01/09/20 161 lb (73 kg)   Constitutional: overweight, in NAD Eyes: PERRLA, EOMI,  no exophthalmos ENT: moist mucous membranes, no thyromegaly, no cervical lymphadenopathy Cardiovascular: RRR, No MRG Respiratory: CTA B Gastrointestinal: abdomen soft, NT, ND, BS+ Musculoskeletal: no deformities, strength intact in all 4 Skin: moist, warm, no rashes Neurological: no tremor with outstretched hands, DTR normal in all 4  ASSESSMENT: 1. DM2, non-insulin-dependent, uncontrolled, with complications - CKD - PN  2. H/o Hypercalcemia  3. Hypothyroidism  4. HL  PLAN:  1. Patient with history of controlled diabetes on low-dose glipizide with larger meals only.  In the past, she was on Metformin but she had to stop due to diarrhea.  At last visit, HbA1c was slightly higher, at 6.3%, although still at goal.  Sugars were at goal after dinner but they were slightly above target in the morning especially after large meals or if she had desserts.  I encouraged her to continue to use glipizide more consistently before larger meals.  We did not change the regimen otherwise. -At today's visit, sugars are mostly at goal, even though she does forget to take glipizide before larger meals.  I emphasized the need to take this, but I do not feel we need to change her regimen otherwise. - I suggested to:  Patient Instructions  Please continue: - Glipizide 2.5 mg before larger dinner if you have dessert.  Please continue Levothyroxine 137 mcg daily.  Take the thyroid hormone every day, with water, at least 30 minutes before breakfast, separated by at least 4 hours from: - acid reflux medications - calcium - iron - multivitamins  Please stop anything with biotin 1 week before our next visit.  Please return in 6 months.  - we checked her HbA1c: 6.7% (higher) - advised to check sugars at different times of the day - 1x a day, rotating check times - advised for yearly eye exams >> she is UTD - return to clinic in 6 months   2. Hypercalcemia -Mild, resolved at last visit, however, since  then, she had a higher calcium, at 10.7 (8.7-10.3) on 06/05/2020.  A phosphorus level was also high, at 6.0%, which points towards CKD-related hypercalcemia.  Her GFR was 31 at that time. -PTH not elevated -Latest vitamin D level was low in 02/2020: 25.2.  Previously on 2000 units vitamin D daily, now off.  Per nephrology.  3. Hypothyroidism - latest thyroid labs reviewed with pt >> normal: Lab Results  Component Value Date   TSH 1.06 01/15/2020   - she continues on LT4 137 mcg daily - pt feels  good on this dose.  At last visit she was levothyroxine dose was too high as she had hot flashes. - we discussed about taking the thyroid hormone every day, with water, >30 minutes before breakfast, separated by >4 hours from acid reflux medications, calcium, iron, multivitamins. Pt. is taking it correctly. - will check thyroid tests at next visit  4. HL -Reviewed latest lipid panel from a year ago: Significantly increased LDL, from 145 to 205.  Triglycerides are also high Lab Results  Component Value Date   CHOL 343 (H) 07/16/2019   HDL 54 07/16/2019   LDLCALC 205 (H) 07/16/2019   LDLDIRECT 99 11/01/2016   TRIG 404 (H) 07/16/2019   CHOLHDL 6.4 (H) 07/16/2019  -She was intolerant to statins in the past but then appears to tolerate Crestor 5 mg daily very well-started 02/2019.  Vascepa was not affordable. -We will have another recheck at next appointment with PCP  Philemon Kingdom, MD PhD [facility] Endocrinology

## 2020-07-10 NOTE — Patient Instructions (Addendum)
Please continue: - Glipizide 2.5 mg before larger dinner if you have dessert.  Please continue Levothyroxine 137 mcg daily.  Take the thyroid hormone every day, with water, at least 30 minutes before breakfast, separated by at least 4 hours from: - acid reflux medications - calcium - iron - multivitamins  Please stop anything Biotin 1 week before our next visit.  Please return in 6 months.

## 2020-07-10 NOTE — Addendum Note (Signed)
Addended by: Lauralyn Primes on: 07/10/2020 09:20 AM   Modules accepted: Orders

## 2020-07-26 ENCOUNTER — Other Ambulatory Visit: Payer: Self-pay | Admitting: Internal Medicine

## 2020-08-05 ENCOUNTER — Ambulatory Visit (INDEPENDENT_AMBULATORY_CARE_PROVIDER_SITE_OTHER): Payer: Medicare Other | Admitting: Internal Medicine

## 2020-08-05 ENCOUNTER — Other Ambulatory Visit: Payer: Self-pay

## 2020-08-05 ENCOUNTER — Encounter (INDEPENDENT_AMBULATORY_CARE_PROVIDER_SITE_OTHER): Payer: Self-pay | Admitting: Internal Medicine

## 2020-08-05 VITALS — BP 161/73 | HR 62 | Temp 98.0°F | Ht 63.0 in | Wt 157.2 lb

## 2020-08-05 DIAGNOSIS — L290 Pruritus ani: Secondary | ICD-10-CM | POA: Diagnosis not present

## 2020-08-05 DIAGNOSIS — R1319 Other dysphagia: Secondary | ICD-10-CM | POA: Diagnosis not present

## 2020-08-05 DIAGNOSIS — K2100 Gastro-esophageal reflux disease with esophagitis, without bleeding: Secondary | ICD-10-CM | POA: Diagnosis not present

## 2020-08-05 MED ORDER — HYDROCORTISONE (PERIANAL) 2.5 % EX CREA: 1.0000 "application " | TOPICAL_CREAM | Freq: Two times a day (BID) | CUTANEOUS | 1 refills | Status: DC

## 2020-08-05 MED ORDER — PANTOPRAZOLE SODIUM 40 MG PO TBEC: 40.0000 mg | DELAYED_RELEASE_TABLET | Freq: Every day | ORAL | 3 refills | Status: DC

## 2020-08-05 NOTE — Patient Instructions (Signed)
Please take pantoprazole daily as directed Please call with progress report in 2 to 3 months regarding dysphagia or earlier if it gets worse. Use ProctoCream daily for 1 to 2 weeks and thereafter on as-needed basis

## 2020-08-05 NOTE — Progress Notes (Signed)
Presenting complaint;  Follow for diarrhea and GERD.  Database and subjective:  Patient is 87 year old Caucasian female was history of erosive reflux esophagitis and esophageal stricture which was last dilated in March 2021.  She also has a history of chronic diarrhea felt to be due to IBS.  She had colonoscopy with biopsies and May 2020 ruling out microscopic colitis. She also has a history of positive hepatitis B surface antigen in June 2015.  HBV DNA then was undetectable and she was felt to be an active carrier.  Transaminases have always been normal except when she was found to be diabetic and transaminases quickly returned to normal and treatment of hypoglycemia.  Patient complains of heartburn at least once a week and she also is experience dysphagia with solids.  She has at least 1 episode every 2 weeks.  Food bolus usually passes spontaneously after waiting for few seconds or minutes.  She is also intermittent constipation.  She is not having diarrhea anymore.  She says she is not taking Metamucil and PPI on schedule. Her appetite is good.  She goes to the gym twice a week.  She also has cut back on calorie intake in order to improve glycemic control.  She has lost 3 pounds since her last visit. Finally she also complains of pruritus a night. She has chronic kidney disease and being followed by Dr. Benjiman Core.  Current Medications: Outpatient Encounter Medications as of 08/05/2020  Medication Sig  . Accu-Chek Softclix Lancets lancets Check BS BID and PRN  . acetaminophen (TYLENOL) 500 MG tablet Take 1,000 mg by mouth daily as needed for moderate pain or headache.  . Alpha-Lipoic Acid 600 MG TABS Take 600 mg by mouth daily.  Marland Kitchen ALPRAZolam (XANAX) 0.25 MG tablet Take 1 tablet (0.25 mg total) by mouth 2 (two) times daily as needed for anxiety. (Patient taking differently: Take 0.25 mg by mouth at bedtime as needed (sleep.).)  . amLODipine (NORVASC) 5 MG tablet Take 1 tablet (5 mg  total) by mouth daily.  Marland Kitchen atenolol (TENORMIN) 50 MG tablet Take 1 tablet (50 mg total) by mouth daily.  Marland Kitchen b complex vitamins tablet Take 1 tablet by mouth daily.  . Biotin 2500 MCG CAPS Take by mouth daily.  . DULoxetine (CYMBALTA) 30 MG capsule Take 1 capsule (30 mg total) by mouth daily. (Patient taking differently: Take 30 mg by mouth at bedtime.)  . famotidine (PEPCID) 20 MG tablet Take 1 tablet (20 mg total) by mouth at bedtime. (Patient taking differently: Take 20 mg by mouth at bedtime as needed.)  . glipiZIDE (GLUCOTROL) 5 MG tablet TAKE 1/2 TABLET BY MOUTH  DAILY ONLY IF HAVING A  LARGE MEAL AT DINNER  . glucose blood (ACCU-CHEK AVIVA PLUS) test strip Test BS 2 times daily Dx E11.21  . irbesartan (AVAPRO) 150 MG tablet TAKE 1 TABLET BY MOUTH  DAILY (Patient taking differently: Take 300 mg by mouth daily.)  . levothyroxine (SYNTHROID) 137 MCG tablet TAKE 1 TABLET BY MOUTH  DAILY BEFORE BREAKFAST  . pantoprazole (PROTONIX) 40 MG tablet Take 1 tablet (40 mg total) by mouth daily before lunch.  . Probiotic Product (PROBIOTIC DAILY PO) Take by mouth daily.  . psyllium (METAMUCIL SMOOTH TEXTURE) 58.6 % powder Take 1 packet by mouth daily.  . rosuvastatin (CRESTOR) 5 MG tablet TAKE 1 TABLET BY MOUTH  DAILY (Patient taking differently: Take 5 mg by mouth at bedtime.)  . Vitamin D, Ergocalciferol, (DRISDOL) 1.25 MG (50000 UNIT) CAPS capsule Take  50,000 Units by mouth once a week.  . [DISCONTINUED] Ascorbic Acid (VITAMIN C) 1000 MG tablet Take 1,000 mg by mouth at bedtime. (Patient not taking: Reported on 08/05/2020)  . [DISCONTINUED] calcium citrate-vitamin D 200-200 MG-UNIT TABS Take 1 tablet by mouth daily.    . [DISCONTINUED] Cholecalciferol (VITAMIN D3) 50 MCG (2000 UT) TABS Take 2,000 mg by mouth at bedtime. (Patient not taking: No sig reported)  . [DISCONTINUED] vitamin B-12 (CYANOCOBALAMIN) 1000 MCG tablet Take 2,000 mcg by mouth at bedtime. Gummies (Patient not taking: Reported on 08/05/2020)    No facility-administered encounter medications on file as of 08/05/2020.     Objective: Blood pressure (!) 161/73, pulse 62, temperature 98 F (36.7 C), temperature source Oral, height 5' 3"  (1.6 m), weight 157 lb 3.2 oz (71.3 kg). Patient is alert and in no acute distress. Patient is wearing a mask. Conjunctiva is pink. Sclera is nonicteric Oropharyngeal mucosa is normal. No neck masses or thyromegaly noted. Cardiac exam with regular rhythm normal S1 and S2. No murmur or gallop noted. Lungs are clear to auscultation. Abdomen is full but soft and nontender without organomegaly or masses.  Liver edge is easily palpable below right costal margin and soft. No LE edema or clubbing noted.  Labs/studies Results:   CBC Latest Ref Rng & Units 07/16/2019 02/26/2019 11/09/2018  WBC 3.4 - 10.8 x10E3/uL 7.3 8.1 7.7  Hemoglobin 11.1 - 15.9 g/dL 14.7 13.9 14.3  Hematocrit 34.0 - 46.6 % 42.5 41.1 42.2  Platelets 150 - 450 x10E3/uL 203 186 204    CMP Latest Ref Rng & Units 02/05/2020 07/16/2019 02/26/2019  Glucose 65 - 99 mg/dL - 166(H) 147(H)  BUN 8 - 27 mg/dL - 19 28(H)  Creatinine 0.57 - 1.00 mg/dL - 1.46(H) 1.28(H)  Sodium 134 - 144 mmol/L - 137 139  Potassium 3.5 - 5.2 mmol/L - 4.7 4.8  Chloride 96 - 106 mmol/L - 104 103  CO2 20 - 29 mmol/L - 20 22  Calcium 8.7 - 10.3 mg/dL - 8.8 8.6(L)  Total Protein 6.1 - 8.1 g/dL 6.6 6.1 6.0  Total Bilirubin 0.2 - 1.2 mg/dL 0.4 0.2 0.3  Alkaline Phos 39 - 117 IU/L - 205(H) 153(H)  AST 10 - 35 U/L 18 24 26   ALT 6 - 29 U/L 10 18 23     Hepatic Function Latest Ref Rng & Units 02/05/2020 07/16/2019 02/26/2019  Total Protein 6.1 - 8.1 g/dL 6.6 6.1 6.0  Albumin 3.6 - 4.6 g/dL - 3.5(L) 3.5(L)  AST 10 - 35 U/L 18 24 26   ALT 6 - 29 U/L 10 18 23   Alk Phosphatase 39 - 117 IU/L - 205(H) 153(H)  Total Bilirubin 0.2 - 1.2 mg/dL 0.4 0.2 0.3  Bilirubin, Direct 0.0 - 0.2 mg/dL 0.1 - -    Hepatitis B surface antigen positive in June 2009. HBV DNA undetectable in  June 2009 and subsequently. Hepatitis B surface antigen negative in February 2015 and October 2016.  HBV DNA undetectable on 02/05/2020.  Assessment:  #1.  Chronic GERD complicated by distal esophageal stricture which was last dilated in May 2021.  She is having intermittent heartburn and dysphagia as as well.  She is not taking her medication as recommended.  I believe risk of not taking medication is much higher than the risk of taking medication.  If she remains with dysphagia after taking PPI daily would consider EGD with esophageal dilation.  #2.  History of diarrhea felt to be due to IBS.  Now she  is having intermittent constipation.  She needs to take Metamucil every day.  #3.  History of positive hepatitis B surface antigen in June 2009.  She tested negative and 2015 in 2016.  Hepatitis B surface antigen has always been negative.  Recent HBV DNA by Dr. Cruzita Lederer, her nephrologist was negative.  She was deemed to be an active carrier in 2009 and few days later she seroconverted spontaneously.  No further tests indicated at this time.  #4.  Pruritus ani.  Will use topical steroid for 1 to 2 weeks.  Patient advised to use moist wipes to clean.   Plan:  ProctoCream-HC to be applied to medial perianal area twice daily 1 to 2 weeks and thereafter on as-needed basis. Patient advised to take Metamucil and pantoprazole daily. Patient will call with progress report in few weeks. If she remains with dysphagia we will consider EGD with esophageal dilation. Office visit in 3 months.

## 2020-08-11 ENCOUNTER — Telehealth (INDEPENDENT_AMBULATORY_CARE_PROVIDER_SITE_OTHER): Payer: Self-pay | Admitting: Internal Medicine

## 2020-08-11 NOTE — Telephone Encounter (Signed)
Talked with the patient. She states that he Anusol Cream was going to cost her  over $ 200.00. The pharmacist at the [facility] told her that she could get the Proctozole Cream 1 month for $ 10.00 and a 90 supply for 0 cost. She would like for Dr. Laural Golden to fill this if he is in agreement , and do it for 90 days.  Patient advised that this would be addressed with Dr. Laural Golden 08/13/20 when he is back in office.

## 2020-08-11 NOTE — Telephone Encounter (Signed)
Patient left voice mail message stating she would like to talk to someone about the medication Dr Laural Golden put her on last week - please advise - ph# [phone] or [phone]

## 2020-08-13 ENCOUNTER — Other Ambulatory Visit (INDEPENDENT_AMBULATORY_CARE_PROVIDER_SITE_OTHER): Payer: Self-pay | Admitting: *Deleted

## 2020-08-13 NOTE — Telephone Encounter (Signed)
Per Dr. Laural Golden  patient may use the Proctazole but we will send only a month supply, 3 month supply is to much.  Patient was called and a message was left with Dr. Ewell Poe recommendation. I ask the patient to call our office back to let me know that it was okay to call in the 1 month supply.

## 2020-08-14 NOTE — Telephone Encounter (Signed)
The Proctozole HC 2.5 % cream was called to the [facility]. Patient was made aware.

## 2020-08-19 ENCOUNTER — Ambulatory Visit: Payer: Self-pay

## 2020-08-19 ENCOUNTER — Encounter: Payer: Self-pay | Admitting: Orthopaedic Surgery

## 2020-08-19 ENCOUNTER — Other Ambulatory Visit: Payer: Self-pay

## 2020-08-19 ENCOUNTER — Ambulatory Visit: Payer: Medicare Other | Admitting: Orthopaedic Surgery

## 2020-08-19 VITALS — Ht 63.0 in | Wt 159.0 lb

## 2020-08-19 DIAGNOSIS — M25562 Pain in left knee: Secondary | ICD-10-CM

## 2020-08-19 DIAGNOSIS — M25511 Pain in right shoulder: Secondary | ICD-10-CM | POA: Diagnosis not present

## 2020-08-19 DIAGNOSIS — G8929 Other chronic pain: Secondary | ICD-10-CM

## 2020-08-19 NOTE — Progress Notes (Signed)
Office Visit Note   Patient: Lisa Sutton           Date of Birth: 01-08-1934           MRN: [id number] Visit Date: 08/19/2020              Requested by: Wannetta Sender, [place] [facility] [address] [place],  [place] [address] PCP: Wannetta Sender, FNP   Assessment & Plan: Visit Diagnoses:  1. Acute pain of left knee   2. Chronic right shoulder pain     Plan: Patient has known full-thickness Dupre distal supraspinatus tear and musculotendinous intersubstance tear of the supraspinatus document on MRI 2016.  She does have some rotator cuff tendinopathy and still can get her arm up overhead but has positive impingement.  Good knee range of motion she can work on some quadricep strengthening.  Follow-Up Instructions: No follow-ups on file.   Orders:  Orders Placed This Encounter  Procedures  . XR Knee 1-2 Views Left  . XR Shoulder Right   No orders of the defined types were placed in this encounter.     Procedures: No procedures performed   Clinical Data: No additional findings.   Subjective: Chief Complaint  Patient presents with  . Right Shoulder - Pain  . Left Knee - Pain    HPI 87 year old female returns.  She did well after total knee arthroplasty 2017 on the left knee and states 2 weeks ago she turned over in bed and felt something shift and she had some increased discomfort.  She goes to an exercise class.  She is also had problems with a right shoulder rotator cuff tear have developed difficulty with raising her arm overhead.  Review of Systems   Objective: Vital Signs: Ht 5\' 3"  (1.6 m)   Wt 159 lb (72.1 kg)   BMI 28.17 kg/m   Physical Exam Constitutional:      Appearance: She is well-developed.  HENT:     Head: Normocephalic.     Right Ear: External ear normal.     Left Ear: External ear normal.  Eyes:     Pupils: Pupils are equal, round, and reactive to light.  Neck:     Thyroid: No  thyromegaly.     Trachea: No tracheal deviation.  Cardiovascular:     Rate and Rhythm: Normal rate.  Pulmonary:     Effort: Pulmonary effort is normal.  Abdominal:     Palpations: Abdomen is soft.  Skin:    General: Skin is warm and dry.  Neurological:     Mental Status: She is alert and oriented to person, place, and time.  Psychiatric:        Behavior: Behavior normal.     Ortho Exam positive drop arm test right shoulder she can get her arm up overhead positive impingement.  Subscap external rotation is strong.  Knee shows well-healed incision negative logroll of the hips.  No knee effusion. Specialty Comments:  No specialty comments available.  Imaging: No results found.   PMFS History: Patient Active Problem List   Diagnosis Date Noted  . Pruritus ani 08/05/2020  . History of hepatitis B 02/05/2020  . Chronic diarrhea 07/24/2019  . Proteinuria 07/16/2019  . GAD (generalized anxiety disorder) 03/02/2019  . Controlled substance agreement signed 03/02/2019  . Hyperlipidemia associated with type 2 diabetes mellitus ([place]) 02/19/2019  . CKD stage 3 due to type 2 diabetes mellitus (  [place]) 02/19/2019  . Hypertension associated with type 2 diabetes mellitus ([place]) 02/19/2019  . Elevated alkaline phosphatase level 01/15/2019  . Stage 3 chronic kidney disease ([place]) 06/15/2017  . History of colonic polyps 05/09/2017  . Presbycusis of both ears 06/04/2016  . Hypothyroidism due to acquired atrophy of thyroid 04/26/2016  . Vitamin D deficiency 04/26/2016  . Status post total left knee replacement 09/15/2015  . Statin intolerance 09/11/2015  . Diabetic neuropathy, type II diabetes mellitus ([place]) 07/30/2015  . Abnormal chest x-ray 11/27/2014  . Primary insomnia 04/12/2013  . GERD (gastroesophageal reflux disease) 04/25/2012  . Fatty liver 08/09/2011  . Esophageal dysphagia 08/09/2011  . Type 2 diabetes mellitus with diabetic nephropathy, without long-term current use of insulin  ([place]) 08/09/2011   Past Medical History:  Diagnosis Date  . Anxiety   . Arthritis   . Cataract   . Dysphagia    'sometimes but not a major issue' been checked out by GI (per pt)  . Dysrhythmia    'heart used to skip but doesn't anymore' was checked out by Dr. Ron Parker late '90s, everything checked out ok and not had any skipping since (all per pt)  . Essential hypertension, benign   . Fatty liver   . GERD (gastroesophageal reflux disease)   . H/O hypercalcemia 04/12/2017  . Hepatitis B    had at age 65, 'GI doc said it's gone away'  . Hepatitis B surface antigen positive   . High cholesterol   . History of hiatal hernia   . Hypercalcemia   . Hypothyroidism   . Kidney stone August 2014   Patient was [facility]  . Obesity   . Osteoporosis   . PONV (postoperative nausea and vomiting)   . Type 2 diabetes mellitus ([facility])     Family History  Problem Relation Age of Onset  . Hypertension Father   . Hip fracture Father   . COPD Mother   . Heart attack Mother   . Cancer Maternal Aunt   . Diabetes Maternal Aunt   . Diabetes Maternal Uncle   . Colon cancer Neg Hx     Past Surgical History:  Procedure Laterality Date  . ABDOMINAL HYSTERECTOMY    . BILATERAL CARPAL TUNNEL RELEASE     Dr. Marlou Sa at surgical center  . BIOPSY  05/26/2017   Procedure: BIOPSY;  Surgeon: Rogene Houston, MD;  [facility];  Service: Endoscopy;;  colon  . BIOPSY  10/11/2018   Procedure: BIOPSY;  Surgeon: Rogene Houston, MD;  [facility];  Service: Endoscopy;;  . CATARACT EXTRACTION, BILATERAL    . COLONOSCOPY N/A 05/26/2017   Procedure: COLONOSCOPY;  Surgeon: Rogene Houston, MD;  [facility];  Service: Endoscopy;  Laterality: N/A;  10:30  . Deviated septum repair    . ESOPHAGEAL DILATION N/A 08/16/2019   Procedure: ESOPHAGEAL DILATION;  Surgeon: Rogene Houston, MD;  [facility];  Service: Endoscopy;  Laterality: N/A;  . ESOPHAGOGASTRODUODENOSCOPY N/A  07/28/2016   Procedure: ESOPHAGOGASTRODUODENOSCOPY (EGD);  Surgeon: Rogene Houston, MD;  [facility];  Service: Endoscopy;  Laterality: N/A;  300 - moved to 3/7 @ 1:00  . ESOPHAGOGASTRODUODENOSCOPY N/A 08/16/2019   Procedure: ESOPHAGOGASTRODUODENOSCOPY (EGD);  Surgeon: Rogene Houston, MD;  [facility];  Service: Endoscopy;  Laterality: N/A;  230  . EYE SURGERY    . FLEXIBLE SIGMOIDOSCOPY N/A 10/11/2018   Procedure: FLEXIBLE SIGMOIDOSCOPY;  Surgeon: [name]  U, MD;  [facility];  Service: Endoscopy;  Laterality: N/A;  unable to complete colonoscopy   . KNEE ARTHROPLASTY Left 09/15/2015   Procedure: COMPUTER ASSISTED TOTAL KNEE ARTHROPLASTY;  Surgeon: Marybelle Killings, MD;  Location: [place];  Service: Orthopedics;  Laterality: Left;  . KNEE ARTHROSCOPY     left knee 2003   Social History   Occupational History    Comment: retired  Tobacco Use  . Smoking status: Never Smoker  . Smokeless tobacco: Never Used  Vaping Use  . Vaping Use: Never used  Substance and Sexual Activity  . Alcohol use: No    Alcohol/week: 0.0 standard drinks  . Drug use: No  . Sexual activity: Never

## 2020-09-19 ENCOUNTER — Other Ambulatory Visit (HOSPITAL_COMMUNITY): Payer: Self-pay | Admitting: Nephrology

## 2020-09-19 ENCOUNTER — Other Ambulatory Visit: Payer: Self-pay | Admitting: Nephrology

## 2020-09-19 DIAGNOSIS — N1832 Chronic kidney disease, stage 3b: Secondary | ICD-10-CM

## 2020-09-19 DIAGNOSIS — R809 Proteinuria, unspecified: Secondary | ICD-10-CM

## 2020-09-19 DIAGNOSIS — I129 Hypertensive chronic kidney disease with stage 1 through stage 4 chronic kidney disease, or unspecified chronic kidney disease: Secondary | ICD-10-CM

## 2020-09-29 ENCOUNTER — Ambulatory Visit (HOSPITAL_COMMUNITY)
Admission: RE | Admit: 2020-09-29 | Discharge: 2020-09-29 | Disposition: A | Discharge status: Home / Self Care | Payer: Medicare Other | Source: Ambulatory Visit | Attending: Nephrology | Admitting: Nephrology

## 2020-09-29 ENCOUNTER — Other Ambulatory Visit: Payer: Self-pay

## 2020-09-29 DIAGNOSIS — R809 Proteinuria, unspecified: Secondary | ICD-10-CM | POA: Diagnosis present

## 2020-09-29 DIAGNOSIS — I129 Hypertensive chronic kidney disease with stage 1 through stage 4 chronic kidney disease, or unspecified chronic kidney disease: Secondary | ICD-10-CM | POA: Diagnosis present

## 2020-09-29 DIAGNOSIS — N1832 Chronic kidney disease, stage 3b: Secondary | ICD-10-CM | POA: Diagnosis present

## 2020-10-14 ENCOUNTER — Other Ambulatory Visit: Payer: Self-pay

## 2020-10-14 ENCOUNTER — Encounter: Payer: Self-pay | Admitting: Dermatology

## 2020-10-14 ENCOUNTER — Ambulatory Visit: Payer: Medicare Other | Admitting: Dermatology

## 2020-10-14 DIAGNOSIS — L814 Other melanin hyperpigmentation: Secondary | ICD-10-CM

## 2020-10-14 DIAGNOSIS — Z1283 Encounter for screening for malignant neoplasm of skin: Secondary | ICD-10-CM | POA: Diagnosis not present

## 2020-10-14 DIAGNOSIS — D1801 Hemangioma of skin and subcutaneous tissue: Secondary | ICD-10-CM | POA: Diagnosis not present

## 2020-10-14 DIAGNOSIS — D692 Other nonthrombocytopenic purpura: Secondary | ICD-10-CM

## 2020-10-14 NOTE — Progress Notes (Signed)
   Follow-Up Visit   Subjective  Lisa Sutton is a 87 y.o. female who presents for the following: Annual Exam (No new concerns).  General skin examination Location:  Duration:  Quality:  Associated Signs/Symptoms: Modifying Factors:  Severity:  Timing: Context:   Objective  Well appearing patient in no apparent distress; mood and affect are within normal limits.   All sun exposed areas plus back examined.  Plus chest and upper abdomen.   Assessment & Plan     Annual skin examination done.  Lisa Sutton has multiple typical noninflamed keratoses most numerous on the back and two  1 cm spots on the left lower breast.  She has a 61mm light brown macule on the right side of the upper nose which shows a lentigo with dermoscopy.  If this changes color or size she will return for biopsy.  Finally there are multiple cherry angiomas, somewhat larger on the lower breast area; these do not require watching or removal.  Recheck in 1 year.   I, Lavonna Monarch, MD, have reviewed all documentation for this visit.  The documentation on 10/14/20 for the exam, diagnosis, procedures, and orders are all accurate and complete.

## 2020-10-23 ENCOUNTER — Encounter: Payer: Self-pay | Admitting: Internal Medicine

## 2020-10-28 ENCOUNTER — Encounter: Payer: Self-pay | Admitting: Dermatology

## 2020-12-09 ENCOUNTER — Ambulatory Visit (INDEPENDENT_AMBULATORY_CARE_PROVIDER_SITE_OTHER): Payer: Medicare Other | Admitting: Internal Medicine

## 2020-12-09 ENCOUNTER — Encounter: Payer: Self-pay | Admitting: Internal Medicine

## 2020-12-09 ENCOUNTER — Other Ambulatory Visit: Payer: Self-pay

## 2020-12-09 VITALS — BP 120/72 | HR 60 | Ht 63.0 in | Wt 156.0 lb

## 2020-12-09 DIAGNOSIS — E1121 Type 2 diabetes mellitus with diabetic nephropathy: Secondary | ICD-10-CM

## 2020-12-09 DIAGNOSIS — E034 Atrophy of thyroid (acquired): Secondary | ICD-10-CM

## 2020-12-09 DIAGNOSIS — Z8639 Personal history of other endocrine, nutritional and metabolic disease: Secondary | ICD-10-CM

## 2020-12-09 LAB — POCT GLYCOSYLATED HEMOGLOBIN (HGB A1C): Hemoglobin A1C: 6.2 % — AB (ref 4.0–5.6)

## 2020-12-09 NOTE — Addendum Note (Signed)
Addended by: Lauralyn Primes on: 12/09/2020 07:01 PM   Modules accepted: Orders

## 2020-12-09 NOTE — Progress Notes (Signed)
Patient ID: Lisa Sutton, female   DOB: 1934/03/14, 87 y.o.   MRN: [id number]   This visit occurred during the SARS-CoV-2 public health emergency.  Safety protocols were in place, including screening questions prior to the visit, additional usage of staff PPE, and extensive cleaning of exam room while observing appropriate contact time as indicated for disinfecting solutions.   HPI: Lisa Sutton is a 87 y.o.-year-old female returning for follow-up for h/o hypercalcemia, DM2, dx in 2015, non-insulin-dependent, uncontrolled, with complications (CKD, peripheral neuropathy).  Last visit 5 months ago. PCP at [facility].  Interim history: No increased urination, blurry vision, nausea, chest pain. She has insomnia >> takes Tylenol pm.  Reviewed HbA1c levels: Lab Results  Component Value Date   HGBA1C 6.7 (A) 07/10/2020   HGBA1C 6.3 (A) 01/09/2020   HGBA1C 6.1 (A) 06/20/2019  02/15/2018: HbA1c 6.4% 10/07/2017: HbA1c 6.2% 06/10/2017: HbA1c 7.0% 01/2017: HbA1c 6.1%   She is on: - Glipizide 2.5 mg before a larger dinner or before a meal with dessert  We tried Metformin ER 500 mg with dinner 05/2018 -she had diarrhea and she stopped She could not afford Januvia.  Pt checks her sugars twice a day: - am: 128-153, 160 >> 96, 123-152, 200 >> 114-144 >> 125-143, 152 - 2h after b'fast: 141-252 >> 122-145 >> n/c - before lunch:  n/c >> 84 >> n/c >> 118 >> 110 >> n/c - 2h after lunch:  n/c >> 140s, 170-180 >> n/c - before dinner: 109 >> 90-127 >> n/c >> 93 >> 86 - 2h after dinner:  120-156 >> 130-160, 210 >> 106-172, 184, 203 - bedtime: 89-126 >> 140-150 >> n/c - nighttime: n/c >> 129, 150 >> n/c Lowest sugar was 73 >> ...96 >> 93 >> 86; she has hypoglycemia awareness in the 70s. Highest sugar was 200 (icecream) >> 210 >> 203.   Glucometer: Accu-Chek Aviva  Pt's meals are: - Breakfast: 2 boiled eggs + toast + tomato or croissants + am and egg - Lunch: [place] sandwich + lettuce + tomato +  mayo - Dinner: green beans, slaw + potatoes + meatloaf   - Snacks: crackers + PB Diet green tea. In the past, she was going to [employer] sneakers 3 times a week, but not during the coronavirus pandemic.  -+ CKD with nephrotic range proteinuria-sees nephrology (Dr. Theador Hawthorne), last BUN/creatinine:  Before last visit, her kidney function worsened.  Her nephrologist wanted to check the kidney biopsy, but she refused. 09/15/2020: BUN/creatinine 28/1.51, GFR 34, glucose 166 06/05/2020: BUN/creatinine 29/1.54, GFR 31, protein/creatinine ratio 10,079 Lab Results  Component Value Date   BUN 19 07/16/2019   BUN 28 (H) 02/26/2019   CREATININE 1.46 (H) 07/16/2019   CREATININE 1.28 (H) 02/26/2019  On irbesartan.   -+ HL; last set of lipids: Lab Results  Component Value Date   CHOL 343 (H) 07/16/2019   HDL 54 07/16/2019   LDLCALC 205 (H) 07/16/2019   LDLDIRECT 99 11/01/2016   TRIG 404 (H) 07/16/2019   CHOLHDL 6.4 (H) 07/16/2019  She could not tolerate statins in the past due to muscle cramps.   PCP recommended Vascepa (given samples) but this was not affordable.  On Crestor 5 mg daily.    - last eye exam was in 11/2020: No DR reportedly - Dr. Katy Fitch.  -She has improving numbness, burning, tingling in her feet.  Previously on Neurontin, which she stopped due to weight gain; now on B complex, alpha lipoic acid, and Cymbalta.  Hypothyroidism:  She  is on levothyroxine 137 mcg daily (increased in 07/2019): - in am - fasting - at least 30 min from b'fast - no calcium - no iron - no multivitamins - + PPIs later in the day - + Biotin in B complex and Hair Skin and Nails - last dose today  Reviewed her TFTs: Lab Results  Component Value Date   TSH 1.06 01/15/2020   TSH 3.52 09/17/2019   TSH 4.92 (H) 08/20/2019   TSH 20.400 (H) 07/16/2019   TSH 4.680 (H) 02/26/2019   TSH 4.340 11/09/2018   TSH 1.740 06/19/2018   TSH 1.340 02/15/2018   TSH 1.400 10/07/2017   TSH 3.960 06/10/2017   TSH  2.580 01/27/2017   TSH 0.906 09/03/2016   TSH 0.911 04/21/2016   TSH 0.735 09/08/2015   TSH 0.374 (L) 07/30/2015   TSH 0.368 (L) 06/04/2015   TSH 0.208 (L) 05/27/2015   TSH 1.300 03/13/2015   TSH 1.417 11/26/2014   TSH 1.110 10/17/2014   She has a history of secondary (non-PTH mediated) hypercalcemia, due to HCTZ.   This normalized after stopping HCTZ was recently high again 2/2 CKD: 06/05/2020: Ca 10.7, Phos 6 03/06/2020: calcium 8.8, PTH 37 Lab Results  Component Value Date   PTH 11 (L) 06/04/2015   PTH Comment 06/04/2015   PTH 11 (L) 05/29/2015   PTH Comment 05/29/2015   PTH 6 (L) 01/13/2015   PTH Comment 01/13/2015   PTH 12 (L) 12/02/2014   PTH 5 (L) 11/26/2014   CALCIUM 8.8 07/16/2019   CALCIUM 8.6 (L) 02/26/2019   CALCIUM 8.9 11/09/2018   CALCIUM 9.3 06/19/2018   CALCIUM 9.4 02/15/2018   CALCIUM 9.8 10/07/2017   CALCIUM 9.6 06/10/2017   CALCIUM 9.4 01/27/2017   CALCIUM 10.7 (H) 01/21/2017   CALCIUM 10.2 11/01/2016   CALCIUM 9.5 09/03/2016   CALCIUM 9.7 04/21/2016   CALCIUM 9.3 09/18/2015   CALCIUM 8.9 09/17/2015   CALCIUM 8.6 (L) 09/16/2015   CALCIUM 9.6 09/08/2015   CALCIUM 10.5 (H) 09/03/2015   CALCIUM 9.3 06/04/2015   CALCIUM CANCELED 05/29/2015   CALCIUM CANCELED 05/27/2015   Vitamin D levels were reviewed: 07/14/2020: Vitamin D 21.3 03/06/2020: Vitamin D 25.2 Lab Results  Component Value Date   VD25OH 42.8 02/26/2019   VD25OH 47.0 11/09/2018   VD25OH 42.1 09/27/2018   VD25OH 27.8 (L) 06/19/2018   VD25OH 33.4 02/15/2018   VD25OH 32.6 10/07/2017   VD25OH 38.7 06/10/2017   VD25OH 40.5 01/21/2017   VD25OH 32.9 09/03/2016   VD25OH 63.1 05/27/2015   She was on 2000 units vitamin D daily - now off -per nephrology.  Of note, she had a negative SPEP.  She is suspected to have FSGS.   ROS: + see HPI Neurological: no tremors/+ numbness/+ tingling/no dizziness  I reviewed pt's medications, allergies, PMH, social hx, family hx, and changes were  documented in the history of present illness. Otherwise, unchanged from my initial visit note.  Past Medical History:  Diagnosis Date   Anxiety    Arthritis    Cataract    Dysphagia    'sometimes but not a major issue' been checked out by GI (per pt)   Dysrhythmia    'heart used to skip but doesn't anymore' was checked out by Dr. Ron Parker late '90s, everything checked out ok and not had any skipping since (all per pt)   Essential hypertension, benign    Fatty liver    GERD (gastroesophageal reflux disease)    H/O hypercalcemia 04/12/2017  Hepatitis B    had at age 45, 'GI doc said it's gone away'   Hepatitis B surface antigen positive    High cholesterol    History of hiatal hernia    Hypercalcemia    Hypothyroidism    Kidney stone August 2014   Patient was [facility] at [facility]   Obesity    Osteoporosis    PONV (postoperative nausea and vomiting)    Type 2 diabetes mellitus ([place])    Past Surgical History:  Procedure Laterality Date   ABDOMINAL HYSTERECTOMY     BILATERAL CARPAL TUNNEL RELEASE     Dr. Marlou Sa at surgical center   BIOPSY  05/26/2017   Procedure: BIOPSY;  Surgeon: Rogene Houston, MD;  [facility];  Service: Endoscopy;;  colon   BIOPSY  10/11/2018   Procedure: BIOPSY;  Surgeon: Rogene Houston, MD;  [facility]: [facility];  Service: Endoscopy;;   CATARACT EXTRACTION, BILATERAL     COLONOSCOPY N/A 05/26/2017   Procedure: COLONOSCOPY;  Surgeon: Rogene Houston, MD;  [facility]: [facility];  Service: Endoscopy;  Laterality: N/A;  10:30   Deviated septum repair     ESOPHAGEAL DILATION N/A 08/16/2019   Procedure: ESOPHAGEAL DILATION;  Surgeon: Rogene Houston, MD;  [facility]: [facility];  Service: Endoscopy;  Laterality: N/A;   ESOPHAGOGASTRODUODENOSCOPY N/A 07/28/2016   Procedure: ESOPHAGOGASTRODUODENOSCOPY (EGD);  Surgeon: Rogene Houston, MD;  [facility];  Service: Endoscopy;  Laterality: N/A;  300 - moved to 3/7 @ 1:00    ESOPHAGOGASTRODUODENOSCOPY N/A 08/16/2019   Procedure: ESOPHAGOGASTRODUODENOSCOPY (EGD);  Surgeon: Rogene Houston, MD;  [facility]: [facility];  Service: Endoscopy;  Laterality: N/A;  [place] N/A 10/11/2018   Procedure: FLEXIBLE SIGMOIDOSCOPY;  Surgeon: Rogene Houston, MD;  Location: [facility];  Service: Endoscopy;  Laterality: N/A;  unable to complete colonoscopy    KNEE ARTHROPLASTY Left 09/15/2015   Procedure: COMPUTER ASSISTED TOTAL KNEE ARTHROPLASTY;  Surgeon: Marybelle Killings, MD;  Location: [place];  Service: Orthopedics;  Laterality: Left;   KNEE ARTHROSCOPY     left knee 2003   Social History   Socioeconomic History   Marital status: Widowed    Spouse name: Not on file   Number of children: 1   Years of education: Not on file   Highest education level: High school graduate  Occupational History    Comment: retired  Tobacco Use   Smoking status: Never   Smokeless tobacco: Never  Vaping Use   Vaping Use: Never used  Substance and Sexual Activity   Alcohol use: No    Alcohol/week: 0.0 standard drinks   Drug use: No   Sexual activity: Never  Other Topics Concern   Not on file  Social History Narrative   07/04/19 Lives with son   Caffeine- coffee 1 c daily   Social Determinants of Health   Financial Resource Strain: Not on file  Food Insecurity: Not on file  Transportation Needs: Not on file  Physical Activity: Not on file  Stress: Not on file  Social Connections: Not on file  Intimate Partner Violence: Not on file   Current Outpatient Medications on File Prior to Visit  Medication Sig Dispense Refill   Accu-Chek Softclix Lancets lancets Check BS BID and PRN 200 each 3   acetaminophen (TYLENOL) 500 MG tablet Take 1,000 mg by mouth daily as needed for moderate pain or headache.  Alpha-Lipoic Acid 600 MG TABS Take 600 mg by mouth daily.     ALPRAZolam (XANAX) 0.25 MG tablet Take 1 tablet (0.25 mg total) by mouth 2 (two) times  daily as needed for anxiety. (Patient taking differently: Take 0.25 mg by mouth at bedtime as needed (sleep.).) 180 tablet 1   amLODipine (NORVASC) 5 MG tablet Take 1 tablet (5 mg total) by mouth daily. 90 tablet 0   atenolol (TENORMIN) 50 MG tablet Take 1 tablet (50 mg total) by mouth daily. 90 tablet 1   b complex vitamins tablet Take 1 tablet by mouth daily.     Biotin 2500 MCG CAPS Take by mouth daily.     DULoxetine (CYMBALTA) 30 MG capsule Take 1 capsule (30 mg total) by mouth daily. (Patient taking differently: Take 30 mg by mouth at bedtime.) 90 capsule 0   famotidine (PEPCID) 20 MG tablet Take 1 tablet (20 mg total) by mouth at bedtime. (Patient taking differently: Take 20 mg by mouth at bedtime as needed.)     glipiZIDE (GLUCOTROL) 5 MG tablet TAKE 1/2 TABLET BY MOUTH  DAILY ONLY IF HAVING A  LARGE MEAL AT DINNER 45 tablet 3   glucose blood (ACCU-CHEK AVIVA PLUS) test strip Test BS 2 times daily Dx E11.21 200 strip 3   hydrocortisone (ANUSOL-HC) 2.5 % rectal cream Place 1 application rectally 2 (two) times daily. 30 g 1   irbesartan (AVAPRO) 150 MG tablet TAKE 1 TABLET BY MOUTH  DAILY (Patient taking differently: Take 300 mg by mouth daily.) 90 tablet 0   levothyroxine (SYNTHROID) 137 MCG tablet TAKE 1 TABLET BY MOUTH  DAILY BEFORE BREAKFAST 90 tablet 3   pantoprazole (PROTONIX) 40 MG tablet Take 1 tablet (40 mg total) by mouth daily before lunch. 90 tablet 3   Probiotic Product (PROBIOTIC DAILY PO) Take by mouth daily.     psyllium (METAMUCIL SMOOTH TEXTURE) 58.6 % powder Take 1 packet by mouth daily. 283 g 12   rosuvastatin (CRESTOR) 5 MG tablet TAKE 1 TABLET BY MOUTH  DAILY (Patient taking differently: Take 5 mg by mouth at bedtime.) 90 tablet 1   Vitamin D, Ergocalciferol, (DRISDOL) 1.25 MG (50000 UNIT) CAPS capsule Take 50,000 Units by mouth once a week.     [DISCONTINUED] calcium citrate-vitamin D 200-200 MG-UNIT TABS Take 1 tablet by mouth daily.       No current  facility-administered medications on file prior to visit.   Allergies  Allergen Reactions   Statins Other (See Comments)    'discomfort, aching everywhere' Tolerates to livalo    Zetia [Ezetimibe] Other (See Comments)    Leg pain   Zocor [Simvastatin] Other (See Comments)    Leg pain   Family History  Problem Relation Age of Onset   Hypertension Father    Hip fracture Father    COPD Mother    Heart attack Mother    Cancer Maternal Aunt    Diabetes Maternal Aunt    Diabetes Maternal Uncle    Colon cancer Neg Hx     PE: There were no vitals taken for this visit. Wt Readings from Last 3 Encounters:  08/19/20 159 lb (72.1 kg)  08/05/20 157 lb 3.2 oz (71.3 kg)  07/10/20 158 lb 3.2 oz (71.8 kg)   Constitutional: overweight, in NAD Eyes: PERRLA, EOMI, no exophthalmos ENT: moist mucous membranes, no thyromegaly, no cervical lymphadenopathy Cardiovascular: RRR, No MRG Respiratory: CTA B Gastrointestinal: abdomen soft, NT, ND, BS+ Musculoskeletal: no deformities, strength intact in  all 4 Skin: moist, warm, no rashes Neurological: no tremor with outstretched hands, DTR normal in all 4  ASSESSMENT: 1. DM2, non-insulin-dependent, uncontrolled, with complications - CKD - PN  2. H/o Hypercalcemia  3. Hypothyroidism  4. HL  PLAN:  1. Patient with history of controlled diabetes on low-dose glipizide with larger meals only.  At last visit, HbA1c was higher, at 6.7%, but still at goal.  She was not using glipizide consistently before larger meals, and I advised her to start doing so.  We did not change her regimen otherwise. -At today's visit, sugars are mostly at goal, with occasional mildly hyperglycemic values in the morning.  She is checking after dinner, also and his sugars are also mostly at goal.  For now, I would not suggest a change in regimen. - I suggested to:  Patient Instructions  Please continue: - Glipizide 2.5 mg before larger dinner if you have  dessert.  Please continue Levothyroxine 137 mcg daily.  Take the thyroid hormone every day, with water, at least 30 minutes before breakfast, separated by at least 4 hours from: - acid reflux medications - calcium - iron - multivitamins  Please stop anything with biotin 1 week before our next visit.  Please return in 6 months.  - we checked her HbA1c: 6.2% (improved) - advised to check sugars at different times of the day - 1x a day, rotating check times - advised for yearly eye exams >> she is UTD - return to clinic in 6 months  2. Hypercalcemia -Managed by nephrology.   -She has a history of very low vitamin D level, last 21.3 on 07/14/2020.  We previously had her on 2000 units vitamin D daily, but this was discontinued by nephrology. -Reviewed calcium of 10.7 (8.7-10.3) from 06/05/2020.  A phosphorus level was also high, at 6.0, which points towards CKD-related hypercalcemia.  Her GFR was 31 at that time. -PTH was not previously elevated  3. Hypothyroidism - latest thyroid labs reviewed with pt. >> normal: Lab Results  Component Value Date   TSH 1.06 01/15/2020  - she continues on LT4 137 mcg daily - pt feels good on this dose. - we discussed about taking the thyroid hormone every day, with water, >30 minutes before breakfast, separated by >4 hours from acid reflux medications, calcium, iron, multivitamins. Pt. is taking it correctly. - will check thyroid tests after she has been off biotin for a week (she forgot to stop this before the visit): TSH and fT4 - If labs are abnormal, she will need to return for repeat TFTs in 1.5 months  4. HL -Reviewed latest lipid panel from 06/2019: LDL extremely high, at 205.  Triglycerides also high, HDL at goal Lab Results  Component Value Date   CHOL 343 (H) 07/16/2019   HDL 54 07/16/2019   LDLCALC 205 (H) 07/16/2019   LDLDIRECT 99 11/01/2016   TRIG 404 (H) 07/16/2019   CHOLHDL 6.4 (H) 07/16/2019  -She was intolerant to statins in  the past but she appears to tolerate Crestor 5 mg daily very well.  This was started 02/2019.  Vascepa was not affordable. -She recently had  another lipid panel by PCP and was advised to start a PCSK9 inhibitor, however, this is too expensive. -I will try to obtain the results of her most recent lipid panel.   Philemon Kingdom, MD PhD [facility] Endocr[facility]

## 2020-12-09 NOTE — Patient Instructions (Addendum)
Please continue: - Glipizide 2.5 mg before larger dinner if you have dessert.  Please continue Levothyroxine 137 mcg daily.  Take the thyroid hormone every day, with water, at least 30 minutes before breakfast, separated by at least 4 hours from: - acid reflux medications - calcium - iron - multivitamins  Please stop anything with biotin 1 week before thyroid labs.  Come back for thyroid tests at the end of August.  Please return in 6 months.

## 2021-01-05 ENCOUNTER — Encounter: Payer: Self-pay | Admitting: Internal Medicine

## 2021-01-05 ENCOUNTER — Other Ambulatory Visit: Payer: Self-pay | Admitting: Internal Medicine

## 2021-01-05 DIAGNOSIS — E782 Mixed hyperlipidemia: Secondary | ICD-10-CM

## 2021-01-05 NOTE — Progress Notes (Signed)
Pt. Sent me her most recent labs drawn by PCP on 10/23/2020: CBC with differential: Normal Vitamin D: 27 Lipids: 344/337/48/225 CMP: Glucose 137, BUN/creatinine 34/1.66, GFR 30, potassium 5.3 (3.5-5.2), CO2 17 (20-29), otherwise normal I would suggest to try to increase the Crestor as much as possible.  I would also highly recommend a referral to the lipid clinic for PCSK9 inhibitors.  I will discuss with the patient.

## 2021-01-07 ENCOUNTER — Ambulatory Visit: Payer: Medicare Other | Admitting: Internal Medicine

## 2021-01-08 ENCOUNTER — Ambulatory Visit: Payer: Medicare Other | Admitting: Internal Medicine

## 2021-01-15 ENCOUNTER — Other Ambulatory Visit (INDEPENDENT_AMBULATORY_CARE_PROVIDER_SITE_OTHER): Payer: Medicare Other

## 2021-01-15 ENCOUNTER — Other Ambulatory Visit: Payer: Self-pay

## 2021-01-15 DIAGNOSIS — E034 Atrophy of thyroid (acquired): Secondary | ICD-10-CM

## 2021-01-15 DIAGNOSIS — E782 Mixed hyperlipidemia: Secondary | ICD-10-CM | POA: Diagnosis not present

## 2021-01-15 LAB — LDL CHOLESTEROL, DIRECT: Direct LDL: 161 mg/dL

## 2021-01-15 LAB — LIPID PANEL
Cholesterol: 273 mg/dL — ABNORMAL HIGH (ref 0–200)
HDL: 52.8 mg/dL (ref >39.00)
NonHDL: 220.08
Total CHOL/HDL Ratio: 5
Triglycerides: 213 mg/dL — ABNORMAL HIGH (ref 0.0–149.0)
VLDL: 42.6 mg/dL — ABNORMAL HIGH (ref 0.0–40.0)

## 2021-01-15 LAB — T4, FREE: Free T4: 1.39 ng/dL (ref 0.60–1.60)

## 2021-01-15 LAB — TSH: TSH: 0.05 u[IU]/mL — ABNORMAL LOW (ref 0.35–5.50)

## 2021-01-16 ENCOUNTER — Other Ambulatory Visit: Payer: Self-pay | Admitting: Internal Medicine

## 2021-01-16 DIAGNOSIS — E034 Atrophy of thyroid (acquired): Secondary | ICD-10-CM

## 2021-01-16 MED ORDER — LEVOTHYROXINE SODIUM 125 MCG PO TABS: 125.0000 ug | ORAL_TABLET | Freq: Every day | ORAL | 3 refills | Status: AC

## 2021-01-16 MED ORDER — LEVOTHYROXINE SODIUM 125 MCG PO TABS: 125.0000 ug | ORAL_TABLET | Freq: Every day | ORAL | 3 refills | Status: DC

## 2021-03-02 ENCOUNTER — Encounter (HOSPITAL_COMMUNITY): Payer: Self-pay | Admitting: *Deleted

## 2021-03-02 ENCOUNTER — Emergency Department (HOSPITAL_COMMUNITY): Payer: Medicare Other

## 2021-03-02 ENCOUNTER — Emergency Department (HOSPITAL_COMMUNITY)
Admission: EM | Admit: 2021-03-02 | Discharge: 2021-03-02 | Disposition: A | Discharge status: Home / Self Care | Payer: Medicare Other | Attending: Emergency Medicine | Admitting: Emergency Medicine

## 2021-03-02 ENCOUNTER — Other Ambulatory Visit: Payer: Self-pay

## 2021-03-02 DIAGNOSIS — I129 Hypertensive chronic kidney disease with stage 1 through stage 4 chronic kidney disease, or unspecified chronic kidney disease: Secondary | ICD-10-CM | POA: Insufficient documentation

## 2021-03-02 DIAGNOSIS — Z96652 Presence of left artificial knee joint: Secondary | ICD-10-CM | POA: Insufficient documentation

## 2021-03-02 DIAGNOSIS — Z79899 Other long term (current) drug therapy: Secondary | ICD-10-CM | POA: Diagnosis not present

## 2021-03-02 DIAGNOSIS — E1122 Type 2 diabetes mellitus with diabetic chronic kidney disease: Secondary | ICD-10-CM | POA: Diagnosis not present

## 2021-03-02 DIAGNOSIS — N3090 Cystitis, unspecified without hematuria: Secondary | ICD-10-CM | POA: Insufficient documentation

## 2021-03-02 DIAGNOSIS — Z7984 Long term (current) use of oral hypoglycemic drugs: Secondary | ICD-10-CM | POA: Diagnosis not present

## 2021-03-02 DIAGNOSIS — E039 Hypothyroidism, unspecified: Secondary | ICD-10-CM | POA: Diagnosis not present

## 2021-03-02 DIAGNOSIS — R410 Disorientation, unspecified: Secondary | ICD-10-CM

## 2021-03-02 DIAGNOSIS — N183 Chronic kidney disease, stage 3 unspecified: Secondary | ICD-10-CM | POA: Diagnosis not present

## 2021-03-02 DIAGNOSIS — R4182 Altered mental status, unspecified: Secondary | ICD-10-CM | POA: Insufficient documentation

## 2021-03-02 LAB — URINALYSIS, ROUTINE W REFLEX MICROSCOPIC
Bilirubin Urine: NEGATIVE
Glucose, UA: 50 mg/dL — AB
Ketones, ur: NEGATIVE mg/dL
Nitrite: NEGATIVE
Protein, ur: >=300 mg/dL — AB
Specific Gravity, Urine: 1.007 (ref 1.005–1.030)
pH: 6 (ref 5.0–8.0)

## 2021-03-02 LAB — COMPREHENSIVE METABOLIC PANEL
ALT: 13 U/L (ref 0–44)
AST: 16 U/L (ref 15–41)
Albumin: 3.8 g/dL (ref 3.5–5.0)
Alkaline Phosphatase: 67 U/L (ref 38–126)
Anion gap: 8 (ref 5–15)
BUN: 33 mg/dL — ABNORMAL HIGH (ref 8–23)
CO2: 22 mmol/L (ref 22–32)
Calcium: 8.5 mg/dL — ABNORMAL LOW (ref 8.9–10.3)
Chloride: 102 mmol/L (ref 98–111)
Creatinine, Ser: 1.67 mg/dL — ABNORMAL HIGH (ref 0.44–1.00)
GFR, Estimated: 30 mL/min — ABNORMAL LOW (ref >60)
Glucose, Bld: 156 mg/dL — ABNORMAL HIGH (ref 70–99)
Potassium: 4.1 mmol/L (ref 3.5–5.1)
Sodium: 132 mmol/L — ABNORMAL LOW (ref 135–145)
Total Bilirubin: 0.6 mg/dL (ref 0.3–1.2)
Total Protein: 7.1 g/dL (ref 6.5–8.1)

## 2021-03-02 LAB — CBC WITH DIFFERENTIAL/PLATELET
Abs Immature Granulocytes: 0.07 10*3/uL (ref 0.00–0.07)
Basophils Absolute: 0.1 10*3/uL (ref 0.0–0.1)
Basophils Relative: 1 %
Eosinophils Absolute: 0 10*3/uL (ref 0.0–0.5)
Eosinophils Relative: 0 %
HCT: 39.2 % (ref 36.0–46.0)
Hemoglobin: 13.5 g/dL (ref 12.0–15.0)
Immature Granulocytes: 1 %
Lymphocytes Relative: 26 %
Lymphs Abs: 2.6 10*3/uL (ref 0.7–4.0)
MCH: 31.3 pg (ref 26.0–34.0)
MCHC: 34.4 g/dL (ref 30.0–36.0)
MCV: 90.7 fL (ref 80.0–100.0)
Monocytes Absolute: 1 10*3/uL (ref 0.1–1.0)
Monocytes Relative: 10 %
Neutro Abs: 6.4 10*3/uL (ref 1.7–7.7)
Neutrophils Relative %: 62 %
Platelets: 244 10*3/uL (ref 150–400)
RBC: 4.32 MIL/uL (ref 3.87–5.11)
RDW: 12.1 % (ref 11.5–15.5)
WBC: 10.1 10*3/uL (ref 4.0–10.5)
nRBC: 0 % (ref 0.0–0.2)

## 2021-03-02 LAB — AMMONIA: Ammonia: 17 umol/L (ref 9–35)

## 2021-03-02 MED ORDER — CEPHALEXIN 500 MG PO CAPS: 500.0000 mg | ORAL_CAPSULE | Freq: Four times a day (QID) | ORAL | 0 refills | Status: DC

## 2021-03-02 NOTE — ED Provider Notes (Signed)
[facility] EMERGENCY DEPARTMENT Provider Note   CSN: [id number] Arrival date & time: 03/02/21  1744     History Chief Complaint  Patient presents with   Altered Mental Status    Lisa Sutton is a 87 y.o. female.  Patient states that after she got the flu shot about a week ago she has been more confused than normal  The history is provided by the patient and medical records. No language interpreter was used.  Altered Mental Status Severity:  Mild Episode history:  Single Timing:  Intermittent Progression:  Improving Chronicity:  New Context: not alcohol use   Associated symptoms: no abdominal pain, no hallucinations, no headaches, no rash and no seizures       Past Medical History:  Diagnosis Date   Anxiety    Arthritis    Cataract    Dysphagia    'sometimes but not a major issue' been checked out by GI (per pt)   Dysrhythmia    'heart used to skip but doesn't anymore' was checked out by Dr. Ron Parker late '90s, everything checked out ok and not had any skipping since (all per pt)   Essential hypertension, benign    Fatty liver    GERD (gastroesophageal reflux disease)    H/O hypercalcemia 04/12/2017   Hepatitis B    had at age 77, 'GI doc said it's gone away'   Hepatitis B surface antigen positive    High cholesterol    History of hiatal hernia    Hypercalcemia    Hypothyroidism    Kidney stone August 2014   Patient was seen at [facility]   Obesity    Osteoporosis    PONV (postoperative nausea and vomiting)    Type 2 diabetes mellitus ([place])     Patient Active Problem List   Diagnosis Date Noted   Pruritus ani 08/05/2020   History of hepatitis B 02/05/2020   Chronic diarrhea 07/24/2019   Proteinuria 07/16/2019   GAD (generalized anxiety disorder) 03/02/2019   Controlled substance agreement signed 03/02/2019   Hyperlipidemia associated with type 2 diabetes mellitus ([place]) 02/19/2019   CKD stage 3 due to type 2 diabetes mellitus ([place]) 02/19/2019    Hypertension associated with type 2 diabetes mellitus ([place]) 02/19/2019   Elevated alkaline phosphatase level 01/15/2019   Stage 3 chronic kidney disease ([place]) 06/15/2017   History of colonic polyps 05/09/2017   Presbycusis of both ears 06/04/2016   Hypothyroidism due to acquired atrophy of thyroid 04/26/2016   Vitamin D deficiency 04/26/2016   Status post total left knee replacement 09/15/2015   Statin intolerance 09/11/2015   Diabetic neuropathy, type II diabetes mellitus ([place]) 07/30/2015   Abnormal chest x-ray 11/27/2014   Primary insomnia 04/12/2013   GERD (gastroesophageal reflux disease) 04/25/2012   Fatty liver 08/09/2011   Esophageal dysphagia 08/09/2011   Type 2 diabetes mellitus with diabetic nephropathy, without long-term current use of insulin ([place]) 08/09/2011    Past Surgical History:  Procedure Laterality Date   ABDOMINAL HYSTERECTOMY     BILATERAL CARPAL TUNNEL RELEASE     Dr. Marlou Sa at surgical center   BIOPSY  05/26/2017   Procedure: BIOPSY;  Surgeon: Rogene Houston, MD;  Location: [facility];  Service: Endoscopy;;  colon   BIOPSY  10/11/2018   Procedure: BIOPSY;  Surgeon: Rogene Houston, MD;  Location: [facility];  Service: Endoscopy;;   CATARACT EXTRACTION, BILATERAL     COLONOSCOPY N/A 05/26/2017   Procedure: COLONOSCOPY;  Surgeon: [name]  U, MD;  Location: [facility];  Service: Endoscopy;  Laterality: N/A;  10:30   Deviated septum repair     ESOPHAGEAL DILATION N/A 08/16/2019   Procedure: ESOPHAGEAL DILATION;  Surgeon: Rogene Houston, MD;  [facility];  Service: Endoscopy;  Laterality: N/A;   ESOPHAGOGASTRODUODENOSCOPY N/A 07/28/2016   Procedure: ESOPHAGOGASTRODUODENOSCOPY (EGD);  Surgeon: Rogene Houston, MD;  [facility];  Service: Endoscopy;  Laterality: N/A;  300 - moved to 3/7 @ 1:00   ESOPHAGOGASTRODUODENOSCOPY N/A 08/16/2019   Procedure: ESOPHAGOGASTRODUODENOSCOPY (EGD);  Surgeon: Rogene Houston, MD;  [facility];  Service: Endoscopy;  Laterality: N/A;  [place] N/A 10/11/2018   Procedure: FLEXIBLE SIGMOIDOSCOPY;  Surgeon: Rogene Houston, MD;  Location[facility];  Service: Endoscopy;  Laterality: N/A;  unable to complete colonoscopy    KNEE ARTHROPLASTY Left 09/15/2015   Procedure: COMPUTER ASSISTED TOTAL KNEE ARTHROPLASTY;  Surgeon: Marybelle Killings, MD;  Location: [place];  Service: Orthopedics;  Laterality: Left;   KNEE ARTHROSCOPY     left knee 2003     OB History     Gravida      Para      Term      Preterm      AB      Living  1      SAB      IAB      Ectopic      Multiple      Live Births              Family History  Problem Relation Age of Onset   Hypertension Father    Hip fracture Father    COPD Mother    Heart attack Mother    Cancer Maternal Aunt    Diabetes Maternal Aunt    Diabetes Maternal Uncle    Colon cancer Neg Hx     Social History   Tobacco Use   Smoking status: Never   Smokeless tobacco: Never  Vaping Use   Vaping Use: Never used  Substance Use Topics   Alcohol use: No    Alcohol/week: 0.0 standard drinks   Drug use: No    Home Medications Prior to Admission medications   Medication Sig Start Date End Date Taking? Authorizing Provider  cephALEXin (KEFLEX) 500 MG capsule Take 1 capsule (500 mg total) by mouth 4 (four) times daily. 03/02/21  Yes Milton Ferguson, MD  Accu-Chek Softclix Lancets lancets Check BS BID and PRN 07/16/19   Baruch Gouty, FNP  acetaminophen (TYLENOL) 500 MG tablet Take 1,000 mg by mouth daily as needed for moderate pain or headache.    [provider]  Alpha-Lipoic Acid 600 MG CAPS Take 2 capsules by mouth daily.    [provider]  Alpha-Lipoic Acid 600 MG TABS Take 600 mg by mouth daily. Patient not taking: Reported on 03/02/2021    [provider]  ALPRAZolam Duanne Moron) 0.25 MG tablet Take 1 tablet (0.25 mg total) by mouth 2 (two) times  daily as needed for anxiety. Patient taking differently: Take 0.25 mg by mouth at bedtime as needed (sleep.). 07/16/19   Rakes, Connye Burkitt, FNP  ALPRAZolam Duanne Moron) 0.25 MG tablet 1 tablet as needed 08/11/19   [provider]  amLODipine (NORVASC) 10 MG tablet Take 10 mg by mouth daily. 12/09/20   [provider]  amLODipine (NORVASC) 10 MG tablet Take 1  tablet by mouth daily.    [provider]  amLODipine (NORVASC) 5 MG tablet Take 1 tablet (5 mg total) by mouth daily. 10/31/19   Loman Brooklyn, FNP  atenolol (TENORMIN) 50 MG tablet Take 1 tablet (50 mg total) by mouth daily. 05/23/19   Baruch Gouty, FNP  b complex vitamins tablet Take 1 tablet by mouth daily.    [provider]  Biotin 2500 MCG CAPS Take by mouth daily.    [provider]  Cholecalciferol (VITAMIN D-3) 25 MCG (1000 UT) CAPS 2 capsule    [provider]  Cyanocobalamin (VITAMIN B12) 1000 MCG TBCR 1 tablet    [provider]  DULoxetine (CYMBALTA) 30 MG capsule Take 1 capsule (30 mg total) by mouth daily. Patient taking differently: Take 30 mg by mouth at bedtime. 05/23/19   Baruch Gouty, FNP  DULoxetine (CYMBALTA) 30 MG capsule Take 1 capsule by mouth daily. 10/05/19   [provider]  Evolocumab (REPATHA) 140 MG/ML SOSY 1 ml Patient not taking: Reported on 03/02/2021 11/04/20   [provider]  famotidine (PEPCID) 20 MG tablet Take 1 tablet (20 mg total) by mouth at bedtime. Patient taking differently: Take 20 mg by mouth daily. 08/16/19   Rehman, Mechele Dawley, MD  glipiZIDE (GLUCOTROL) 5 MG tablet TAKE 1/2 TABLET BY MOUTH  DAILY ONLY IF HAVING A  LARGE MEAL AT [facility] 04/22/20   Philemon Kingdom, MD  glucose blood (ACCU-CHEK AVIVA PLUS) test strip Test BS 2 times daily Dx E11.21 05/14/19   Baruch Gouty, FNP  hydrocortisone (ANUSOL-HC) 2.5 % rectal cream Place 1 application rectally 2 (two) times daily. 08/05/20   Rehman, Mechele Dawley, MD  ipratropium  (ATROVENT) 0.06 % nasal spray 2 sprays into each nostril Three (3) times a day. 10/25/19   [provider]  irbesartan (AVAPRO) 150 MG tablet TAKE 1 TABLET BY MOUTH  DAILY Patient taking differently: Take 300 mg by mouth daily. 10/30/19   Claretta Fraise, MD  irbesartan (AVAPRO) 150 MG tablet 1 tablet 08/29/19   [provider]  levothyroxine (SYNTHROID) 125 MCG tablet Take 1 tablet (125 mcg total) by mouth daily before breakfast. 01/16/21   Philemon Kingdom, MD  levothyroxine (SYNTHROID) 137 MCG tablet Take 137 mcg by mouth daily before breakfast. Patient not taking: Reported on 03/02/2021 09/18/19   [provider]  pantoprazole (PROTONIX) 40 MG tablet Take 1 tablet (40 mg total) by mouth daily before lunch. 08/05/20   Rogene Houston, MD  pantoprazole (PROTONIX) 40 MG tablet 1 tablet 10/23/19   [provider]  Probiotic Product (PROBIOTIC DAILY PO) Take by mouth daily.    [provider]  psyllium (METAMUCIL SMOOTH TEXTURE) 58.6 % powder Take 1 packet by mouth daily. 10/11/18   Rehman, Mechele Dawley, MD  rosuvastatin (CRESTOR) 5 MG tablet TAKE 1 TABLET BY MOUTH  DAILY Patient taking differently: Take 5 mg by mouth at bedtime. 05/23/19   Baruch Gouty, FNP  sodium zirconium cyclosilicate (LOKELMA) 10 g PACK packet See admin instructions. 02/25/21 02/25/22  [provider]  Vitamin D, Ergocalciferol, (DRISDOL) 1.25 MG (50000 UNIT) CAPS capsule Take 50,000 Units by mouth once a week. 04/28/20   [provider]  calcium citrate-vitamin D 200-200 MG-UNIT TABS Take 1 tablet by mouth daily.    08/10/11  [provider]    Allergies    Statins, Zetia [ezetimibe], and Zocor [simvastatin]  Review of Systems   Review of Systems  Constitutional:  Negative for appetite change and fatigue.  HENT:  Negative for congestion, ear discharge and sinus pressure.   Eyes:  Negative for discharge.  Respiratory:  Negative for cough.   Cardiovascular:   Negative for chest pain.  Gastrointestinal:  Negative for abdominal pain and diarrhea.  Genitourinary:  Negative for frequency and hematuria.  Musculoskeletal:  Negative for back pain.  Skin:  Negative for rash.  Neurological:  Negative for seizures and headaches.       Mild confusion  Psychiatric/Behavioral:  Negative for hallucinations.    Physical Exam Updated Vital Signs BP (!) 160/79   Pulse 68   Temp 97.7 F (36.5 C) (Oral)   Resp 14   SpO2 97%   Physical Exam Vitals and nursing note reviewed.  Constitutional:      Appearance: She is well-developed.  HENT:     Head: Normocephalic.     Nose: Nose normal.  Eyes:     General: No scleral icterus.    Conjunctiva/sclera: Conjunctivae normal.  Neck:     Thyroid: No thyromegaly.  Cardiovascular:     Rate and Rhythm: Normal rate and regular rhythm.     Heart sounds: No murmur heard.   No friction rub. No gallop.  Pulmonary:     Breath sounds: No stridor. No wheezing or rales.  Chest:     Chest wall: No tenderness.  Abdominal:     General: There is no distension.     Tenderness: There is no abdominal tenderness. There is no rebound.  Musculoskeletal:        General: Normal range of motion.     Cervical back: Neck supple.  Lymphadenopathy:     Cervical: No cervical adenopathy.  Skin:    Findings: No erythema or rash.  Neurological:     Motor: No abnormal muscle tone.     Coordination: Coordination normal.     Comments: Oriented to person and place  Psychiatric:        Behavior: Behavior normal.    ED Results / Procedures / Treatments   Labs (all labs ordered are listed, but only abnormal results are displayed) Labs Reviewed  COMPREHENSIVE METABOLIC PANEL - Abnormal; Notable for the following components:      Result Value   Sodium 132 (*)    Glucose, Bld 156 (*)    BUN 33 (*)    Creatinine, Ser 1.67 (*)    Calcium 8.5 (*)    GFR, Estimated 30 (*)    All other components within normal limits  URINALYSIS,  ROUTINE W REFLEX MICROSCOPIC - Abnormal; Notable for the following components:   Color, Urine STRAW (*)    Glucose, UA 50 (*)    Hgb urine dipstick SMALL (*)    Protein, ur >=300 (*)    Leukocytes,Ua TRACE (*)    Bacteria, UA RARE (*)    All other components within normal limits  URINE CULTURE  CBC WITH DIFFERENTIAL/PLATELET  AMMONIA    EKG None  Radiology CT Head Wo Contrast  Result Date: 03/02/2021 CLINICAL DATA:  Cerebral hemorrhage suspected EXAM: CT HEAD WITHOUT CONTRAST TECHNIQUE: Contiguous axial images were obtained from the base of the skull through the vertex without intravenous contrast. COMPARISON:  06/05/2019 CT orbits, 04/28/2015 CT head FINDINGS: Brain: No evidence of acute infarction, hemorrhage, hydrocephalus, extra-axial collection or mass lesion/mass effect. Periventricular white matter changes, likely the sequela of chronic small vessel ischemic disease. Chronic infarcts in the bilateral basal ganglia. Vascular: No hyperdense vessel. Atherosclerotic calcifications in  the intracranial carotid and vertebral arteries. Skull: Normal. Negative for fracture or focal lesion. Sinuses/Orbits: No acute finding. Status post bilateral lens replacements. Other: The mastoids are well aerated. IMPRESSION: No acute intracranial process. Electronically Signed   By: Merilyn Baba M.D.   On: 03/02/2021 19:50    Procedures Procedures   Medications Ordered in ED Medications - No data to display  ED Course  I have reviewed the triage vital signs and the nursing notes.  Pertinent labs & imaging results that were available during my care of the patient were reviewed by me and considered in my medical decision making (see chart for details). Patient with mild confusion.  Labs and CT scan negative except for possible UTI.  Patient will be empirically treated with Keflex and will follow up with family doctor and neurology   MDM Rules/Calculators/A&P                           Mild  confusion.  After getting flu shot.  Labs unremarkable but possible urinary tract infection she is placed on Keflex and referred to neurology Final Clinical Impression(s) / ED Diagnoses Final diagnoses:  Cystitis  Confusion    Rx / DC Orders ED Discharge Orders          Ordered    cephALEXin (KEFLEX) 500 MG capsule  4 times daily        03/02/21 2321             Milton Ferguson, MD 03/10/21 1734

## 2021-03-02 NOTE — Discharge Instructions (Signed)
Drink plenty of fluids.  Follow-up with your family doctor next week for recheck and follow-up with Dr. Merlene Laughter if your thought process does not improve

## 2021-03-02 NOTE — ED Triage Notes (Signed)
Altered mental status for a week after getting a flu shot, states she is unable to function

## 2021-03-02 NOTE — ED Notes (Signed)
Pt to bathroom via wheelchair.

## 2021-03-05 LAB — URINE CULTURE: Culture: 50000 — AB

## 2021-03-06 ENCOUNTER — Other Ambulatory Visit: Payer: Self-pay

## 2021-03-06 ENCOUNTER — Emergency Department (HOSPITAL_COMMUNITY): Payer: Medicare Other

## 2021-03-06 ENCOUNTER — Inpatient Hospital Stay (HOSPITAL_COMMUNITY)
Admission: EM | Admit: 2021-03-06 | Discharge: 2021-03-13 | DRG: 023 | Disposition: A | Discharge status: Skilled Nursing Facility | Payer: Medicare Other | Attending: Neurology | Admitting: Neurology

## 2021-03-06 ENCOUNTER — Telehealth: Payer: Self-pay | Admitting: Emergency Medicine

## 2021-03-06 DIAGNOSIS — I63512 Cerebral infarction due to unspecified occlusion or stenosis of left middle cerebral artery: Secondary | ICD-10-CM | POA: Diagnosis present

## 2021-03-06 DIAGNOSIS — Z4659 Encounter for fitting and adjustment of other gastrointestinal appliance and device: Secondary | ICD-10-CM

## 2021-03-06 DIAGNOSIS — E669 Obesity, unspecified: Secondary | ICD-10-CM | POA: Diagnosis present

## 2021-03-06 DIAGNOSIS — M810 Age-related osteoporosis without current pathological fracture: Secondary | ICD-10-CM | POA: Diagnosis present

## 2021-03-06 DIAGNOSIS — R0689 Other abnormalities of breathing: Secondary | ICD-10-CM | POA: Diagnosis not present

## 2021-03-06 DIAGNOSIS — R4701 Aphasia: Secondary | ICD-10-CM | POA: Diagnosis present

## 2021-03-06 DIAGNOSIS — N183 Chronic kidney disease, stage 3 unspecified: Secondary | ICD-10-CM | POA: Diagnosis present

## 2021-03-06 DIAGNOSIS — E1122 Type 2 diabetes mellitus with diabetic chronic kidney disease: Secondary | ICD-10-CM | POA: Diagnosis present

## 2021-03-06 DIAGNOSIS — Z01818 Encounter for other preprocedural examination: Secondary | ICD-10-CM

## 2021-03-06 DIAGNOSIS — I129 Hypertensive chronic kidney disease with stage 1 through stage 4 chronic kidney disease, or unspecified chronic kidney disease: Secondary | ICD-10-CM | POA: Diagnosis present

## 2021-03-06 DIAGNOSIS — K760 Fatty (change of) liver, not elsewhere classified: Secondary | ICD-10-CM | POA: Diagnosis present

## 2021-03-06 DIAGNOSIS — I161 Hypertensive emergency: Secondary | ICD-10-CM

## 2021-03-06 DIAGNOSIS — Z87442 Personal history of urinary calculi: Secondary | ICD-10-CM

## 2021-03-06 DIAGNOSIS — Z888 Allergy status to other drugs, medicaments and biological substances status: Secondary | ICD-10-CM

## 2021-03-06 DIAGNOSIS — I743 Embolism and thrombosis of arteries of the lower extremities: Secondary | ICD-10-CM | POA: Diagnosis present

## 2021-03-06 DIAGNOSIS — I69392 Facial weakness following cerebral infarction: Secondary | ICD-10-CM

## 2021-03-06 DIAGNOSIS — F419 Anxiety disorder, unspecified: Secondary | ICD-10-CM | POA: Diagnosis present

## 2021-03-06 DIAGNOSIS — I609 Nontraumatic subarachnoid hemorrhage, unspecified: Secondary | ICD-10-CM | POA: Diagnosis present

## 2021-03-06 DIAGNOSIS — Z79890 Hormone replacement therapy (postmenopausal): Secondary | ICD-10-CM

## 2021-03-06 DIAGNOSIS — E1136 Type 2 diabetes mellitus with diabetic cataract: Secondary | ICD-10-CM | POA: Diagnosis present

## 2021-03-06 DIAGNOSIS — I10 Essential (primary) hypertension: Secondary | ICD-10-CM

## 2021-03-06 DIAGNOSIS — Z6827 Body mass index (BMI) 27.0-27.9, adult: Secondary | ICD-10-CM

## 2021-03-06 DIAGNOSIS — I63412 Cerebral infarction due to embolism of left middle cerebral artery: Principal | ICD-10-CM | POA: Diagnosis present

## 2021-03-06 DIAGNOSIS — I639 Cerebral infarction, unspecified: Secondary | ICD-10-CM

## 2021-03-06 DIAGNOSIS — J9601 Acute respiratory failure with hypoxia: Secondary | ICD-10-CM

## 2021-03-06 DIAGNOSIS — R471 Dysarthria and anarthria: Secondary | ICD-10-CM | POA: Diagnosis present

## 2021-03-06 DIAGNOSIS — E039 Hypothyroidism, unspecified: Secondary | ICD-10-CM | POA: Diagnosis present

## 2021-03-06 DIAGNOSIS — Z90710 Acquired absence of both cervix and uterus: Secondary | ICD-10-CM

## 2021-03-06 DIAGNOSIS — R29705 NIHSS score 5: Secondary | ICD-10-CM | POA: Diagnosis present

## 2021-03-06 DIAGNOSIS — Z86010 Personal history of colonic polyps: Secondary | ICD-10-CM

## 2021-03-06 DIAGNOSIS — Z8249 Family history of ischemic heart disease and other diseases of the circulatory system: Secondary | ICD-10-CM

## 2021-03-06 DIAGNOSIS — E7800 Pure hypercholesterolemia, unspecified: Secondary | ICD-10-CM | POA: Diagnosis present

## 2021-03-06 DIAGNOSIS — Z833 Family history of diabetes mellitus: Secondary | ICD-10-CM

## 2021-03-06 DIAGNOSIS — N179 Acute kidney failure, unspecified: Secondary | ICD-10-CM | POA: Diagnosis present

## 2021-03-06 DIAGNOSIS — I7389 Other specified peripheral vascular diseases: Secondary | ICD-10-CM | POA: Diagnosis present

## 2021-03-06 DIAGNOSIS — I69351 Hemiplegia and hemiparesis following cerebral infarction affecting right dominant side: Secondary | ICD-10-CM

## 2021-03-06 DIAGNOSIS — K219 Gastro-esophageal reflux disease without esophagitis: Secondary | ICD-10-CM | POA: Diagnosis present

## 2021-03-06 DIAGNOSIS — Z20822 Contact with and (suspected) exposure to covid-19: Secondary | ICD-10-CM | POA: Diagnosis present

## 2021-03-06 DIAGNOSIS — Z79899 Other long term (current) drug therapy: Secondary | ICD-10-CM

## 2021-03-06 DIAGNOSIS — Z7984 Long term (current) use of oral hypoglycemic drugs: Secondary | ICD-10-CM

## 2021-03-06 HISTORY — DX: Cerebral infarction, unspecified: I63.9

## 2021-03-06 LAB — DIFFERENTIAL
Abs Immature Granulocytes: 0.14 10*3/uL — ABNORMAL HIGH (ref 0.00–0.07)
Basophils Absolute: 0.1 10*3/uL (ref 0.0–0.1)
Basophils Relative: 1 %
Eosinophils Absolute: 0 10*3/uL (ref 0.0–0.5)
Eosinophils Relative: 0 %
Immature Granulocytes: 2 %
Lymphocytes Relative: 32 %
Lymphs Abs: 3 10*3/uL (ref 0.7–4.0)
Monocytes Absolute: 1 10*3/uL (ref 0.1–1.0)
Monocytes Relative: 11 %
Neutro Abs: 5 10*3/uL (ref 1.7–7.7)
Neutrophils Relative %: 54 %

## 2021-03-06 LAB — CBC
HCT: 39.7 % (ref 36.0–46.0)
Hemoglobin: 13.4 g/dL (ref 12.0–15.0)
MCH: 30.5 pg (ref 26.0–34.0)
MCHC: 33.8 g/dL (ref 30.0–36.0)
MCV: 90.4 fL (ref 80.0–100.0)
Platelets: 275 K/uL (ref 150–400)
RBC: 4.39 MIL/uL (ref 3.87–5.11)
RDW: 12.5 % (ref 11.5–15.5)
WBC: 9.2 K/uL (ref 4.0–10.5)
nRBC: 0 % (ref 0.0–0.2)

## 2021-03-06 LAB — RESP PANEL BY RT-PCR (FLU A&B, COVID) ARPGX2
Influenza A by PCR: NEGATIVE
Influenza B by PCR: NEGATIVE
SARS Coronavirus 2 by RT PCR: NEGATIVE

## 2021-03-06 LAB — PROTIME-INR
INR: 0.9 (ref 0.8–1.2)
Prothrombin Time: 12.2 s (ref 11.4–15.2)

## 2021-03-06 LAB — COMPREHENSIVE METABOLIC PANEL
ALT: 13 U/L (ref 0–44)
AST: 19 U/L (ref 15–41)
Albumin: 3.7 g/dL (ref 3.5–5.0)
Alkaline Phosphatase: 66 U/L (ref 38–126)
Anion gap: 8 (ref 5–15)
BUN: 26 mg/dL — ABNORMAL HIGH (ref 8–23)
CO2: 22 mmol/L (ref 22–32)
Calcium: 8.2 mg/dL — ABNORMAL LOW (ref 8.9–10.3)
Chloride: 107 mmol/L (ref 98–111)
Creatinine, Ser: 1.97 mg/dL — ABNORMAL HIGH (ref 0.44–1.00)
GFR, Estimated: 24 mL/min — ABNORMAL LOW (ref >60)
Glucose, Bld: 118 mg/dL — ABNORMAL HIGH (ref 70–99)
Potassium: 4.3 mmol/L (ref 3.5–5.1)
Sodium: 137 mmol/L (ref 135–145)
Total Bilirubin: 0.3 mg/dL (ref 0.3–1.2)
Total Protein: 6.9 g/dL (ref 6.5–8.1)

## 2021-03-06 LAB — ETHANOL: Alcohol, Ethyl (B): <10 mg/dL

## 2021-03-06 LAB — CBG MONITORING, ED: Glucose-Capillary: 111 mg/dL — ABNORMAL HIGH (ref 70–99)

## 2021-03-06 LAB — APTT: aPTT: 33 seconds (ref 24–36)

## 2021-03-06 MED ORDER — IOHEXOL 350 MG/ML SOLN
100.0000 mL | Freq: Once | INTRAVENOUS | Status: AC | PRN
  Administered 2021-03-06: 100 mL via INTRAVENOUS

## 2021-03-06 NOTE — ED Provider Notes (Signed)
[facility] EMERGENCY DEPARTMENT Provider Note   CSN: [id number] Arrival date & time: 03/06/21  2210  An emergency department physician performed an initial assessment on this suspected stroke patient at 2226.  History Chief Complaint  Patient presents with   Altered Mental Status    Lisa Sutton is a 87 y.o. female.  Patient presents chief complaint of word finding difficulty.  She was last seen normal at 7 PM by her son with then went out to go to the store.  When he came back at 8 PM he could not understand what she was saying and she feet appeared confused.  No weakness reported.  No recent illness such as fevers or cough or vomiting or diarrhea reported.  Patient otherwise lives at home with her son.      Past Medical History:  Diagnosis Date   Anxiety    Arthritis    Cataract    Dysphagia    'sometimes but not a major issue' been checked out by GI (per pt)   Dysrhythmia    'heart used to skip but doesn't anymore' was checked out by Dr. Ron Parker late '90s, everything checked out ok and not had any skipping since (all per pt)   Essential hypertension, benign    Fatty liver    GERD (gastroesophageal reflux disease)    H/O hypercalcemia 04/12/2017   Hepatitis B    had at age 37, 'GI doc said it's gone away'   Hepatitis B surface antigen positive    High cholesterol    History of hiatal hernia    Hypercalcemia    Hypothyroidism    Kidney stone August 2014   Patient was seen at [facility]   Obesity    Osteoporosis    PONV (postoperative nausea and vomiting)    Type 2 diabetes mellitus ([place])     Patient Active Problem List   Diagnosis Date Noted   Pruritus ani 08/05/2020   History of hepatitis B 02/05/2020   Chronic diarrhea 07/24/2019   Proteinuria 07/16/2019   GAD (generalized anxiety disorder) 03/02/2019   Controlled substance agreement signed 03/02/2019   Hyperlipidemia associated with type 2 diabetes mellitus ([place]) 02/19/2019   CKD stage 3 due to type 2  diabetes mellitus ([place]) 02/19/2019   Hypertension associated with type 2 diabetes mellitus ([place]) 02/19/2019   Elevated alkaline phosphatase level 01/15/2019   Stage 3 chronic kidney disease ([place]) 06/15/2017   History of colonic polyps 05/09/2017   Presbycusis of both ears 06/04/2016   Hypothyroidism due to acquired atrophy of thyroid 04/26/2016   Vitamin D deficiency 04/26/2016   Status post total left knee replacement 09/15/2015   Statin intolerance 09/11/2015   Diabetic neuropathy, type II diabetes mellitus ([place]) 07/30/2015   Abnormal chest x-ray 11/27/2014   Primary insomnia 04/12/2013   GERD (gastroesophageal reflux disease) 04/25/2012   Fatty liver 08/09/2011   Esophageal dysphagia 08/09/2011   Type 2 diabetes mellitus with diabetic nephropathy, without long-term current use of insulin ([place]) 08/09/2011    Past Surgical History:  Procedure Laterality Date   ABDOMINAL HYSTERECTOMY     BILATERAL CARPAL TUNNEL RELEASE     Dr. Marlou Sa at surgical center   BIOPSY  05/26/2017   Procedure: BIOPSY;  Surgeon: Rogene Houston, MD;  [facility];  Service: Endoscopy;;  colon   BIOPSY  10/11/2018   Procedure: BIOPSY;  Surgeon: Rogene Houston, MD;  [facility];  Service: Endoscopy;;   CATARACT EXTRACTION, BILATERAL  COLONOSCOPY N/A 05/26/2017   Procedure: COLONOSCOPY;  Surgeon: Rogene Houston, MD;  [facility];  Service: Endoscopy;  Laterality: N/A;  10:30   Deviated septum repair     ESOPHAGEAL DILATION N/A 08/16/2019   Procedure: ESOPHAGEAL DILATION;  Surgeon: Rogene Houston, MD;  [facility];  Service: Endoscopy;  Laterality: N/A;   ESOPHAGOGASTRODUODENOSCOPY N/A 07/28/2016   Procedure: ESOPHAGOGASTRODUODENOSCOPY (EGD);  Surgeon: Rogene Houston, MD;  [facility];  Service: Endoscopy;  Laterality: N/A;  300 - moved to 3/7 @ 1:00   ESOPHAGOGASTRODUODENOSCOPY N/A 08/16/2019   Procedure: ESOPHAGOGASTRODUODENOSCOPY (EGD);  Surgeon:  Rogene Houston, MD;  [facility];  Service: Endoscopy;  Laterality: N/A;  [place] N/A 10/11/2018   Procedure: FLEXIBLE SIGMOIDOSCOPY;  Surgeon: Rogene Houston, MD;  [facility];  Service: Endoscopy;  Laterality: N/A;  unable to complete colonoscopy    KNEE ARTHROPLASTY Left 09/15/2015   Procedure: COMPUTER ASSISTED TOTAL KNEE ARTHROPLASTY;  Surgeon: Marybelle Killings, MD;  Location: [place];  Service: Orthopedics;  Laterality: Left;   KNEE ARTHROSCOPY     left knee 2003     OB History     Gravida      Para      Term      Preterm      AB      Living  1      SAB      IAB      Ectopic      Multiple      Live Births              Family History  Problem Relation Age of Onset   Hypertension Father    Hip fracture Father    COPD Mother    Heart attack Mother    Cancer Maternal Aunt    Diabetes Maternal Aunt    Diabetes Maternal Uncle    Colon cancer Neg Hx     Social History   Tobacco Use   Smoking status: Never   Smokeless tobacco: Never  Vaping Use   Vaping Use: Never used  Substance Use Topics   Alcohol use: No    Alcohol/week: 0.0 standard drinks   Drug use: No    Home Medications Prior to Admission medications   Medication Sig Start Date End Date Taking? Authorizing Provider  Accu-Chek Softclix Lancets lancets Check BS BID and PRN 07/16/19   Baruch Gouty, FNP  acetaminophen (TYLENOL) 500 MG tablet Take 1,000 mg by mouth daily as needed for moderate pain or headache.    [provider]  Alpha-Lipoic Acid 600 MG CAPS Take 2 capsules by mouth daily.    [provider]  Alpha-Lipoic Acid 600 MG TABS Take 600 mg by mouth daily. Patient not taking: Reported on 03/02/2021    [provider]  ALPRAZolam Duanne Moron) 0.25 MG tablet Take 1 tablet (0.25 mg total) by mouth 2 (two) times daily as needed for anxiety. Patient taking differently: Take 0.25 mg by mouth at bedtime as  needed (sleep.). 07/16/19   Rakes, Connye Burkitt, FNP  ALPRAZolam Duanne Moron) 0.25 MG tablet 1 tablet as needed 08/11/19   [provider]  amLODipine (NORVASC) 10 MG tablet Take 10 mg by mouth daily. 12/09/20   [provider]  amLODipine (NORVASC) 10 MG tablet Take 1 tablet by mouth daily.    [provider]  amLODipine ([place])  5 MG tablet Take 1 tablet (5 mg total) by mouth daily. 10/31/19   Loman Brooklyn, FNP  atenolol (TENORMIN) 50 MG tablet Take 1 tablet (50 mg total) by mouth daily. 05/23/19   Baruch Gouty, FNP  b complex vitamins tablet Take 1 tablet by mouth daily.    [provider]  Biotin 2500 MCG CAPS Take by mouth daily.    [provider]  cephALEXin (KEFLEX) 500 MG capsule Take 1 capsule (500 mg total) by mouth 4 (four) times daily. 03/02/21   Milton Ferguson, MD  Cholecalciferol (VITAMIN D-3) 25 MCG (1000 UT) CAPS 2 capsule    [provider]  Cyanocobalamin (VITAMIN B12) 1000 MCG TBCR 1 tablet    [provider]  DULoxetine (CYMBALTA) 30 MG capsule Take 1 capsule (30 mg total) by mouth daily. Patient taking differently: Take 30 mg by mouth at bedtime. 05/23/19   Baruch Gouty, FNP  DULoxetine (CYMBALTA) 30 MG capsule Take 1 capsule by mouth daily. 10/05/19   [provider]  Evolocumab (REPATHA) 140 MG/ML SOSY 1 ml Patient not taking: Reported on 03/02/2021 11/04/20   [provider]  famotidine (PEPCID) 20 MG tablet Take 1 tablet (20 mg total) by mouth at bedtime. Patient taking differently: Take 20 mg by mouth daily. 08/16/19   Rehman, Mechele Dawley, MD  glipiZIDE (GLUCOTROL) 5 MG tablet TAKE 1/2 TABLET BY MOUTH  DAILY ONLY IF HAVING A  LARGE MEAL AT [facility] 04/22/20   Philemon Kingdom, MD  glucose blood (ACCU-CHEK AVIVA PLUS) test strip Test BS 2 times daily Dx E11.21 05/14/19   Baruch Gouty, FNP  hydrocortisone (ANUSOL-HC) 2.5 % rectal cream Place 1 application rectally 2 (two) times daily. 08/05/20   Rehman,  Mechele Dawley, MD  ipratropium (ATROVENT) 0.06 % nasal spray 2 sprays into each nostril Three (3) times a day. 10/25/19   [provider]  irbesartan (AVAPRO) 150 MG tablet TAKE 1 TABLET BY MOUTH  DAILY Patient taking differently: Take 300 mg by mouth daily. 10/30/19   Claretta Fraise, MD  irbesartan (AVAPRO) 150 MG tablet 1 tablet 08/29/19   [provider]  levothyroxine (SYNTHROID) 125 MCG tablet Take 1 tablet (125 mcg total) by mouth daily before breakfast. 01/16/21   Philemon Kingdom, MD  levothyroxine (SYNTHROID) 137 MCG tablet Take 137 mcg by mouth daily before breakfast. Patient not taking: Reported on 03/02/2021 09/18/19   [provider]  pantoprazole (PROTONIX) 40 MG tablet Take 1 tablet (40 mg total) by mouth daily before lunch. 08/05/20   Rogene Houston, MD  pantoprazole (PROTONIX) 40 MG tablet 1 tablet 10/23/19   [provider]  Probiotic Product (PROBIOTIC DAILY PO) Take by mouth daily.    [provider]  psyllium (METAMUCIL SMOOTH TEXTURE) 58.6 % powder Take 1 packet by mouth daily. 10/11/18   Rehman, Mechele Dawley, MD  rosuvastatin (CRESTOR) 5 MG tablet TAKE 1 TABLET BY MOUTH  DAILY Patient taking differently: Take 5 mg by mouth at bedtime. 05/23/19   Baruch Gouty, FNP  sodium zirconium cyclosilicate (LOKELMA) 10 g PACK packet See admin instructions. 02/25/21 02/25/22  [provider]  Vitamin D, Ergocalciferol, (DRISDOL) 1.25 MG (50000 UNIT) CAPS capsule Take 50,000 Units by mouth once a week. 04/28/20   [provider]  calcium citrate-vitamin D 200-200 MG-UNIT TABS Take 1 tablet by mouth daily.    08/10/11  [provider]    Allergies    Statins, Zetia [ezetimibe], and Zocor [simvastatin]  Review of Systems   Review of Systems  Unable to perform ROS: Mental status change   Physical Exam Updated Vital Signs BP (!) 143/113   Pulse 63   Temp 97.8 F (36.6 C) (Oral)   Resp 17   Ht 5\' 3"  (1.6 m)   Wt 69.9 kg   SpO2  98%   BMI 27.32 kg/m   Physical Exam Constitutional:      General: She is not in acute distress.    Appearance: Normal appearance.  HENT:     Head: Normocephalic.     Nose: Nose normal.  Eyes:     Extraocular Movements: Extraocular movements intact.  Cardiovascular:     Rate and Rhythm: Normal rate.  Pulmonary:     Effort: Pulmonary effort is normal.  Musculoskeletal:        General: Normal range of motion.     Cervical back: Normal range of motion.  Neurological:     Mental Status: She is alert.     Comments: Patient follows commands, 5/5 strength all extremities.  She is able to say her name, however any additional speech is unintelligible.  Cranial nerves II to XII intact extraocular motion intact.    ED Results / Procedures / Treatments   Labs (all labs ordered are listed, but only abnormal results are displayed) Labs Reviewed  DIFFERENTIAL - Abnormal; Notable for the following components:      Result Value   Abs Immature Granulocytes 0.14 (*)    All other components within normal limits  COMPREHENSIVE METABOLIC PANEL - Abnormal; Notable for the following components:   Glucose, Bld 118 (*)    BUN 26 (*)    Creatinine, Ser 1.97 (*)    Calcium 8.2 (*)    GFR, Estimated 24 (*)    All other components within normal limits  CBG MONITORING, ED - Abnormal; Notable for the following components:   Glucose-Capillary 111 (*)    All other components within normal limits  RESP PANEL BY RT-PCR (FLU A&B, COVID) ARPGX2  ETHANOL  PROTIME-INR  APTT  CBC  RAPID URINE DRUG SCREEN, HOSP PERFORMED  URINALYSIS, ROUTINE W REFLEX MICROSCOPIC  I-STAT CHEM 8, ED    EKG None  Radiology CT HEAD CODE STROKE WO CONTRAST  Result Date: 03/06/2021 CLINICAL DATA:  Code stroke.  Encephalopathy EXAM: CT HEAD WITHOUT CONTRAST TECHNIQUE: Contiguous axial images were obtained from the base of the skull through the vertex without intravenous contrast. COMPARISON:  None. FINDINGS: Brain:  There is no mass, hemorrhage or extra-axial collection. The size and configuration of the ventricles and extra-axial CSF spaces are normal. There is hypoattenuation of the periventricular white matter, most commonly indicating chronic ischemic microangiopathy. Multiple old small vessel infarcts. Vascular: Atherosclerotic calcification of the internal carotid arteries at the skull base. No abnormal hyperdensity of the major intracranial arteries or dural venous sinuses. Skull: The visualized skull base, calvarium and extracranial soft tissues are normal. Sinuses/Orbits: No fluid levels or advanced mucosal thickening of the visualized paranasal sinuses. No mastoid or middle ear effusion. The orbits are normal. ASPECTS [facility] Stroke Program Early CT Score) - Ganglionic level infarction (caudate, lentiform nuclei, internal capsule, insula, M1-M3 cortex): 7 - Supraganglionic infarction (M4-M6 cortex): 3 Total score (0-10 with 10 being normal): 10 IMPRESSION: 1. No acute intracranial abnormality. 2. ASPECTS is 10. 3. Chronic ischemic microangiopathy and multiple old small vessel infarcts. These results were called by telephone at the time of interpretation on 03/06/2021 at 10:36 pm to provider [facility]  Shonta Phillis , who verbally acknowledged these results. Electronically Signed   By: Ulyses Jarred M.D.   On: 03/06/2021 22:36    Procedures .Critical Care Performed by: Luna Fuse, MD Authorized by: Luna Fuse, MD   Critical care provider statement:    Critical care time (minutes):  40   Critical care time was exclusive of:  Separately billable procedures and treating other patients and teaching time   Critical care was necessary to treat or prevent imminent or life-threatening deterioration of the following conditions:  CNS failure or compromise   Medications Ordered in ED Medications  iohexol (OMNIPAQUE) 350 MG/ML injection 100 mL (100 mLs Intravenous Contrast Given 03/06/21 2300)    ED Course  I have  reviewed the triage vital signs and the nursing notes.  Pertinent labs & imaging results that were available during my care of the patient were reviewed by me and considered in my medical decision making (see chart for details).    MDM Rules/Calculators/A&P                           Stroke alert was activated upon arrival.  Patient seen by stroke neuro telemetry physician, recommend no tPA at this time, given outside therapeutic window.  CT angio pursued.  Neurology recommends admission here if no large intervene of the lesion, pending result.  Patient signed out to oncoming provider.  Final Clinical Impression(s) / ED Diagnoses Final diagnoses:  Cerebrovascular accident (CVA), unspecified mechanism [facility])    Rx / [place] Orders ED Discharge Orders     None        Luna Fuse, MD 03/06/21 2321

## 2021-03-06 NOTE — ED Triage Notes (Addendum)
Confusion and difficulty speaking and getting words out since 8pm. LKW 7pm  Here with family.   Son states he left around 7pm to go to the store Came back around 8pm and noticed she was confused. Having trouble getting her words out.   EDP promptly notified.

## 2021-03-07 ENCOUNTER — Emergency Department (HOSPITAL_COMMUNITY): Payer: Medicare Other

## 2021-03-07 ENCOUNTER — Emergency Department (HOSPITAL_COMMUNITY): Payer: Medicare Other | Admitting: Anesthesiology

## 2021-03-07 ENCOUNTER — Encounter (HOSPITAL_COMMUNITY)
Admission: EM | Disposition: A | Discharge status: Skilled Nursing Facility | Payer: Self-pay | Source: Home / Self Care | Attending: Neurology

## 2021-03-07 ENCOUNTER — Inpatient Hospital Stay (HOSPITAL_COMMUNITY): Payer: Medicare Other

## 2021-03-07 DIAGNOSIS — I69351 Hemiplegia and hemiparesis following cerebral infarction affecting right dominant side: Secondary | ICD-10-CM | POA: Diagnosis not present

## 2021-03-07 DIAGNOSIS — K760 Fatty (change of) liver, not elsewhere classified: Secondary | ICD-10-CM | POA: Diagnosis present

## 2021-03-07 DIAGNOSIS — I129 Hypertensive chronic kidney disease with stage 1 through stage 4 chronic kidney disease, or unspecified chronic kidney disease: Secondary | ICD-10-CM | POA: Diagnosis present

## 2021-03-07 DIAGNOSIS — R0689 Other abnormalities of breathing: Secondary | ICD-10-CM | POA: Diagnosis not present

## 2021-03-07 DIAGNOSIS — Z01818 Encounter for other preprocedural examination: Secondary | ICD-10-CM

## 2021-03-07 DIAGNOSIS — I7389 Other specified peripheral vascular diseases: Secondary | ICD-10-CM | POA: Diagnosis present

## 2021-03-07 DIAGNOSIS — K219 Gastro-esophageal reflux disease without esophagitis: Secondary | ICD-10-CM | POA: Diagnosis present

## 2021-03-07 DIAGNOSIS — I63512 Cerebral infarction due to unspecified occlusion or stenosis of left middle cerebral artery: Secondary | ICD-10-CM | POA: Diagnosis not present

## 2021-03-07 DIAGNOSIS — Z8249 Family history of ischemic heart disease and other diseases of the circulatory system: Secondary | ICD-10-CM | POA: Diagnosis not present

## 2021-03-07 DIAGNOSIS — E039 Hypothyroidism, unspecified: Secondary | ICD-10-CM | POA: Diagnosis present

## 2021-03-07 DIAGNOSIS — E782 Mixed hyperlipidemia: Secondary | ICD-10-CM | POA: Diagnosis not present

## 2021-03-07 DIAGNOSIS — F419 Anxiety disorder, unspecified: Secondary | ICD-10-CM | POA: Diagnosis present

## 2021-03-07 DIAGNOSIS — R471 Dysarthria and anarthria: Secondary | ICD-10-CM | POA: Diagnosis present

## 2021-03-07 DIAGNOSIS — R29705 NIHSS score 5: Secondary | ICD-10-CM | POA: Diagnosis present

## 2021-03-07 DIAGNOSIS — E1136 Type 2 diabetes mellitus with diabetic cataract: Secondary | ICD-10-CM | POA: Diagnosis present

## 2021-03-07 DIAGNOSIS — Z20822 Contact with and (suspected) exposure to covid-19: Secondary | ICD-10-CM | POA: Diagnosis present

## 2021-03-07 DIAGNOSIS — I161 Hypertensive emergency: Secondary | ICD-10-CM | POA: Diagnosis present

## 2021-03-07 DIAGNOSIS — N183 Chronic kidney disease, stage 3 unspecified: Secondary | ICD-10-CM | POA: Diagnosis present

## 2021-03-07 DIAGNOSIS — I10 Essential (primary) hypertension: Secondary | ICD-10-CM | POA: Diagnosis not present

## 2021-03-07 DIAGNOSIS — M810 Age-related osteoporosis without current pathological fracture: Secondary | ICD-10-CM | POA: Diagnosis present

## 2021-03-07 DIAGNOSIS — I6389 Other cerebral infarction: Secondary | ICD-10-CM | POA: Diagnosis not present

## 2021-03-07 DIAGNOSIS — R4701 Aphasia: Secondary | ICD-10-CM | POA: Diagnosis present

## 2021-03-07 DIAGNOSIS — I743 Embolism and thrombosis of arteries of the lower extremities: Secondary | ICD-10-CM | POA: Diagnosis present

## 2021-03-07 DIAGNOSIS — E7800 Pure hypercholesterolemia, unspecified: Secondary | ICD-10-CM | POA: Diagnosis present

## 2021-03-07 DIAGNOSIS — I63412 Cerebral infarction due to embolism of left middle cerebral artery: Secondary | ICD-10-CM | POA: Diagnosis present

## 2021-03-07 DIAGNOSIS — I639 Cerebral infarction, unspecified: Secondary | ICD-10-CM | POA: Diagnosis present

## 2021-03-07 DIAGNOSIS — I609 Nontraumatic subarachnoid hemorrhage, unspecified: Secondary | ICD-10-CM | POA: Diagnosis present

## 2021-03-07 DIAGNOSIS — E1122 Type 2 diabetes mellitus with diabetic chronic kidney disease: Secondary | ICD-10-CM | POA: Diagnosis present

## 2021-03-07 DIAGNOSIS — N179 Acute kidney failure, unspecified: Secondary | ICD-10-CM | POA: Diagnosis present

## 2021-03-07 DIAGNOSIS — E669 Obesity, unspecified: Secondary | ICD-10-CM | POA: Diagnosis present

## 2021-03-07 DIAGNOSIS — Z87442 Personal history of urinary calculi: Secondary | ICD-10-CM | POA: Diagnosis not present

## 2021-03-07 HISTORY — PX: IR PERCUTANEOUS ART THROMBECTOMY/INFUSION INTRACRANIAL INC DIAG ANGIO: IMG6087

## 2021-03-07 HISTORY — PX: IR US GUIDE VASC ACCESS RIGHT: IMG2390

## 2021-03-07 HISTORY — PX: RADIOLOGY WITH ANESTHESIA: SHX6223

## 2021-03-07 HISTORY — PX: IR CT HEAD LTD: IMG2386

## 2021-03-07 LAB — GLUCOSE, CAPILLARY
Glucose-Capillary: 221 mg/dL — ABNORMAL HIGH (ref 70–99)
Glucose-Capillary: 159 mg/dL — ABNORMAL HIGH (ref 70–99)
Glucose-Capillary: 124 mg/dL — ABNORMAL HIGH (ref 70–99)
Glucose-Capillary: 113 mg/dL — ABNORMAL HIGH (ref 70–99)
Glucose-Capillary: 136 mg/dL — ABNORMAL HIGH (ref 70–99)
Glucose-Capillary: 122 mg/dL — ABNORMAL HIGH (ref 70–99)

## 2021-03-07 LAB — POCT I-STAT 7, (LYTES, BLD GAS, ICA,H+H)
Acid-base deficit: 8 mmol/L — ABNORMAL HIGH (ref 0.0–2.0)
Bicarbonate: 18.4 mmol/L — ABNORMAL LOW (ref 20.0–28.0)
Calcium, Ion: 1.11 mmol/L — ABNORMAL LOW (ref 1.15–1.40)
HCT: 35 % — ABNORMAL LOW (ref 36.0–46.0)
Hemoglobin: 11.9 g/dL — ABNORMAL LOW (ref 12.0–15.0)
O2 Saturation: 96 %
Patient temperature: 97.3
Potassium: 3.9 mmol/L (ref 3.5–5.1)
Sodium: 138 mmol/L (ref 135–145)
TCO2: 20 mmol/L — ABNORMAL LOW (ref 22–32)
pCO2 arterial: 39.4 mmHg (ref 32.0–48.0)
pH, Arterial: 7.273 — ABNORMAL LOW (ref 7.350–7.450)
pO2, Arterial: 93 mmHg (ref 83.0–108.0)

## 2021-03-07 LAB — RAPID URINE DRUG SCREEN, HOSP PERFORMED
Amphetamines: NOT DETECTED
Barbiturates: NOT DETECTED
Benzodiazepines: NOT DETECTED
Cocaine: NOT DETECTED
Opiates: NOT DETECTED
Tetrahydrocannabinol: NOT DETECTED

## 2021-03-07 LAB — URINALYSIS, ROUTINE W REFLEX MICROSCOPIC
Bacteria, UA: NONE SEEN
Bilirubin Urine: NEGATIVE
Glucose, UA: 50 mg/dL — AB
Ketones, ur: NEGATIVE mg/dL
Leukocytes,Ua: NEGATIVE
Nitrite: NEGATIVE
Protein, ur: >=300 mg/dL — AB
Specific Gravity, Urine: 1.016 (ref 1.005–1.030)
pH: 6 (ref 5.0–8.0)

## 2021-03-07 LAB — LIPID PANEL
Cholesterol: 258 mg/dL — ABNORMAL HIGH (ref 0–200)
HDL: 42 mg/dL (ref >40)
LDL Cholesterol: UNDETERMINED mg/dL (ref 0–99)
Total CHOL/HDL Ratio: 6.1 RATIO
Triglycerides: 751 mg/dL — ABNORMAL HIGH (ref <150)
VLDL: UNDETERMINED mg/dL (ref 0–40)

## 2021-03-07 LAB — HEMOGLOBIN A1C
Hgb A1c MFr Bld: 6.2 % — ABNORMAL HIGH (ref 4.8–5.6)
Mean Plasma Glucose: 131.24 mg/dL

## 2021-03-07 LAB — MRSA NEXT GEN BY PCR, NASAL: MRSA by PCR Next Gen: NOT DETECTED

## 2021-03-07 LAB — LDL CHOLESTEROL, DIRECT: Direct LDL: 147.9 mg/dL — ABNORMAL HIGH (ref 0–99)

## 2021-03-07 SURGERY — IR WITH ANESTHESIA
Anesthesia: General

## 2021-03-07 MED ORDER — SENNOSIDES-DOCUSATE SODIUM 8.6-50 MG PO TABS
1.0000 | ORAL_TABLET | Freq: Every evening | ORAL | Status: DC | PRN
  Administered 2021-03-08: 1 via ORAL
  Filled 2021-03-07: qty 1

## 2021-03-07 MED ORDER — PANTOPRAZOLE SODIUM 40 MG IV SOLR
40.0000 mg | INTRAVENOUS | Status: DC
  Administered 2021-03-07: 40 mg via INTRAVENOUS
  Filled 2021-03-07: qty 40

## 2021-03-07 MED ORDER — ASPIRIN 325 MG PO TABS
ORAL_TABLET | ORAL | Status: AC
  Filled 2021-03-07: qty 2

## 2021-03-07 MED ORDER — IOHEXOL 300 MG/ML  SOLN
100.0000 mL | Freq: Once | INTRAMUSCULAR | Status: AC | PRN
  Administered 2021-03-07: 40 mL via INTRA_ARTERIAL

## 2021-03-07 MED ORDER — ACETAMINOPHEN 160 MG/5ML PO SOLN: 650.0000 mg | ORAL | Status: DC | PRN

## 2021-03-07 MED ORDER — LEVOTHYROXINE SODIUM 25 MCG PO TABS
125.0000 ug | ORAL_TABLET | Freq: Every day | ORAL | Status: DC
  Administered 2021-03-09 – 2021-03-13 (×5): 125 ug via ORAL
  Filled 2021-03-07 (×7): qty 1

## 2021-03-07 MED ORDER — FENTANYL CITRATE (PF) 100 MCG/2ML IJ SOLN: 25.0000 ug | INTRAMUSCULAR | Status: DC | PRN

## 2021-03-07 MED ORDER — AMLODIPINE BESYLATE 5 MG PO TABS: 5.0000 mg | ORAL_TABLET | Freq: Every day | ORAL | Status: DC

## 2021-03-07 MED ORDER — CHLORHEXIDINE GLUCONATE 0.12% ORAL RINSE (MEDLINE KIT)
15.0000 mL | Freq: Two times a day (BID) | OROMUCOSAL | Status: DC
  Administered 2021-03-07 – 2021-03-09 (×5): 15 mL via OROMUCOSAL

## 2021-03-07 MED ORDER — ENOXAPARIN SODIUM 40 MG/0.4ML IJ SOSY: 40.0000 mg | PREFILLED_SYRINGE | INTRAMUSCULAR | Status: DC

## 2021-03-07 MED ORDER — CLEVIDIPINE BUTYRATE 0.5 MG/ML IV EMUL
INTRAVENOUS | Status: DC | PRN
  Administered 2021-03-07: 2 mg/h via INTRAVENOUS

## 2021-03-07 MED ORDER — LIDOCAINE HCL (CARDIAC) PF 100 MG/5ML IV SOSY
PREFILLED_SYRINGE | INTRAVENOUS | Status: DC | PRN
  Administered 2021-03-07: 60 mg via INTRATRACHEAL

## 2021-03-07 MED ORDER — IRBESARTAN 150 MG PO TABS
150.0000 mg | ORAL_TABLET | Freq: Every day | ORAL | Status: DC
  Administered 2021-03-07 – 2021-03-13 (×7): 150 mg via ORAL
  Filled 2021-03-07 (×7): qty 1

## 2021-03-07 MED ORDER — PROPOFOL 1000 MG/100ML IV EMUL
5.0000 ug/kg/min | INTRAVENOUS | Status: DC
  Administered 2021-03-07: 35.527 ug/kg/min via INTRAVENOUS
  Administered 2021-03-07: 35 ug/kg/min via INTRAVENOUS
  Filled 2021-03-07 (×3): qty 100

## 2021-03-07 MED ORDER — ACETAMINOPHEN 325 MG PO TABS: 650.0000 mg | ORAL_TABLET | ORAL | Status: DC | PRN

## 2021-03-07 MED ORDER — SUCCINYLCHOLINE CHLORIDE 200 MG/10ML IV SOSY
PREFILLED_SYRINGE | INTRAVENOUS | Status: DC | PRN
  Administered 2021-03-07: 100 mg via INTRAVENOUS

## 2021-03-07 MED ORDER — PHENYLEPHRINE HCL-NACL 20-0.9 MG/250ML-% IV SOLN
INTRAVENOUS | Status: DC | PRN
  Administered 2021-03-07: 20 ug/min via INTRAVENOUS

## 2021-03-07 MED ORDER — AMLODIPINE BESYLATE 10 MG PO TABS: 10.0000 mg | ORAL_TABLET | Freq: Every day | ORAL | Status: DC

## 2021-03-07 MED ORDER — POLYETHYLENE GLYCOL 3350 17 G PO PACK: 17.0000 g | PACK | Freq: Every day | ORAL | Status: DC

## 2021-03-07 MED ORDER — INSULIN ASPART 100 UNIT/ML IJ SOLN
0.0000 [IU] | Freq: Three times a day (TID) | INTRAMUSCULAR | Status: DC
  Administered 2021-03-07: 2 [IU] via SUBCUTANEOUS
  Administered 2021-03-07: 3 [IU] via SUBCUTANEOUS
  Administered 2021-03-08: 2 [IU] via SUBCUTANEOUS
  Administered 2021-03-09: 3 [IU] via SUBCUTANEOUS
  Administered 2021-03-10 – 2021-03-11 (×3): 2 [IU] via SUBCUTANEOUS
  Administered 2021-03-12: 3 [IU] via SUBCUTANEOUS
  Administered 2021-03-12 – 2021-03-13 (×2): 2 [IU] via SUBCUTANEOUS

## 2021-03-07 MED ORDER — CARVEDILOL 6.25 MG PO TABS: 6.2500 mg | ORAL_TABLET | Freq: Two times a day (BID) | ORAL | Status: DC

## 2021-03-07 MED ORDER — ORAL CARE MOUTH RINSE
15.0000 mL | OROMUCOSAL | Status: DC
  Administered 2021-03-07 (×2): 15 mL via OROMUCOSAL

## 2021-03-07 MED ORDER — SODIUM CHLORIDE 0.9 % IV SOLN: INTRAVENOUS | Status: AC | PRN

## 2021-03-07 MED ORDER — PROPOFOL 1000 MG/100ML IV EMUL: 5.0000 ug/kg/min | INTRAVENOUS | Status: DC

## 2021-03-07 MED ORDER — IOHEXOL 300 MG/ML  SOLN
100.0000 mL | Freq: Once | INTRAMUSCULAR | Status: AC | PRN
  Administered 2021-03-07: 46 mL via INTRA_ARTERIAL

## 2021-03-07 MED ORDER — CHLORHEXIDINE GLUCONATE CLOTH 2 % EX PADS
6.0000 | MEDICATED_PAD | Freq: Every day | CUTANEOUS | Status: DC
  Administered 2021-03-07 – 2021-03-13 (×7): 6 via TOPICAL

## 2021-03-07 MED ORDER — STROKE: EARLY STAGES OF RECOVERY BOOK
Freq: Once | Status: AC
  Filled 2021-03-07 (×2): qty 1

## 2021-03-07 MED ORDER — ROCURONIUM BROMIDE 100 MG/10ML IV SOLN
INTRAVENOUS | Status: DC | PRN
  Administered 2021-03-07: 50 mg via INTRAVENOUS

## 2021-03-07 MED ORDER — PROPOFOL 10 MG/ML IV BOLUS
INTRAVENOUS | Status: DC | PRN
  Administered 2021-03-07: 120 mg via INTRAVENOUS

## 2021-03-07 MED ORDER — SODIUM CHLORIDE 0.9 % IV SOLN: INTRAVENOUS | Status: DC | PRN

## 2021-03-07 MED ORDER — CLEVIDIPINE BUTYRATE 0.5 MG/ML IV EMUL
INTRAVENOUS | Status: AC
  Filled 2021-03-07: qty 50

## 2021-03-07 MED ORDER — CARVEDILOL 6.25 MG PO TABS
6.2500 mg | ORAL_TABLET | Freq: Two times a day (BID) | ORAL | Status: DC
  Administered 2021-03-07: 6.25 mg
  Filled 2021-03-07: qty 1

## 2021-03-07 MED ORDER — FENTANYL CITRATE (PF) 100 MCG/2ML IJ SOLN
INTRAMUSCULAR | Status: AC
  Administered 2021-03-07: 25 ug via INTRAVENOUS
  Filled 2021-03-07: qty 2

## 2021-03-07 MED ORDER — ROSUVASTATIN CALCIUM 5 MG PO TABS
5.0000 mg | ORAL_TABLET | Freq: Every day | ORAL | Status: DC
  Administered 2021-03-08: 5 mg via ORAL
  Filled 2021-03-07 (×3): qty 1

## 2021-03-07 MED ORDER — FENTANYL CITRATE (PF) 250 MCG/5ML IJ SOLN
INTRAMUSCULAR | Status: DC | PRN
  Administered 2021-03-07 (×2): 50 ug via INTRAVENOUS

## 2021-03-07 MED ORDER — CANGRELOR TETRASODIUM 50 MG IV SOLR
INTRAVENOUS | Status: AC
  Filled 2021-03-07: qty 50

## 2021-03-07 MED ORDER — ACETAMINOPHEN 650 MG RE SUPP: 650.0000 mg | RECTAL | Status: DC | PRN

## 2021-03-07 MED ORDER — TICAGRELOR 90 MG PO TABS
ORAL_TABLET | ORAL | Status: AC
  Filled 2021-03-07: qty 2

## 2021-03-07 MED ORDER — FENTANYL CITRATE (PF) 100 MCG/2ML IJ SOLN
25.0000 ug | INTRAMUSCULAR | Status: DC | PRN
  Filled 2021-03-07: qty 2

## 2021-03-07 MED ORDER — CANGRELOR BOLUS VIA INFUSION
INTRAVENOUS | Status: DC | PRN
  Administered 2021-03-07: 1048.5 ug via INTRAVENOUS

## 2021-03-07 MED ORDER — SODIUM CHLORIDE 0.9 % IV SOLN: INTRAVENOUS | Status: DC

## 2021-03-07 MED ORDER — AMLODIPINE BESYLATE 5 MG PO TABS
5.0000 mg | ORAL_TABLET | Freq: Every day | ORAL | Status: DC
  Administered 2021-03-07: 5 mg
  Filled 2021-03-07: qty 1

## 2021-03-07 MED ORDER — ASPIRIN 325 MG PO TABS
ORAL_TABLET | ORAL | Status: DC | PRN
  Administered 2021-03-07: 650 mg

## 2021-03-07 MED ORDER — NICARDIPINE HCL IN NACL 20-0.86 MG/200ML-% IV SOLN
3.0000 mg/h | INTRAVENOUS | Status: DC
  Administered 2021-03-07: 4.5 mg/h via INTRAVENOUS
  Administered 2021-03-07: 3 mg/h via INTRAVENOUS
  Administered 2021-03-07: 7 mg/h via INTRAVENOUS
  Administered 2021-03-08: 3.5 mg/h via INTRAVENOUS
  Filled 2021-03-07 (×6): qty 200

## 2021-03-07 MED ORDER — CLEVIDIPINE BUTYRATE 0.5 MG/ML IV EMUL
0.0000 mg/h | INTRAVENOUS | Status: DC
  Administered 2021-03-07: 7 mg/h via INTRAVENOUS
  Administered 2021-03-07: 14 mg/h via INTRAVENOUS
  Filled 2021-03-07 (×3): qty 100

## 2021-03-07 NOTE — Anesthesia Procedure Notes (Signed)
Arterial Line Insertion Start/End10/15/2022 1:05 AM, 03/07/2021 1:07 AM Performed by: Darral Dash, DO, anesthesiologist  Patient location: OOR procedure area. Preanesthetic checklist: patient identified, IV checked, site marked, risks and benefits discussed, surgical consent, monitors and equipment checked, pre-op evaluation, timeout performed and anesthesia consent Patient sedated Left, ulnar was placed Catheter size: 20 G Hand hygiene performed  and maximum sterile barriers used   Attempts: 1 Procedure performed without using ultrasound guided technique. Following insertion, dressing applied. Post procedure assessment: normal and unchanged  Post procedure complications: second provider assisted and local hematoma. Patient tolerated the procedure well with no immediate complications. Additional procedure comments: Initial attempt CRNA team radial unsuccessful with hematoma. Successful ulnar placement by self.Marland Kitchen

## 2021-03-07 NOTE — Progress Notes (Addendum)
STROKE TEAM PROGRESS NOTE   INTERVAL HISTORY No visitors at bedside. Remains intubated on diprivan and cardene infusions. Bradycardic to 50s early am, now with HR 60s.  Na 137, Cre 1.67,  CTH updated moments ago, had increased sedation for trip to scanner. Opens eyes briefly, not following commands.   Vitals:   03/07/21 0610 03/07/21 0630 03/07/21 0714 03/07/21 0746  BP:  123/65  (!) 136/58  Pulse: (!) 51 (!) 59    Resp: 18 11  18   Temp:   98 F (36.7 C)   TempSrc:   Oral   SpO2: 100% 98%    Weight:      Height:       CBC:  Recent Labs  Lab 03/02/21 1903 03/06/21 2231 03/07/21 0412  WBC 10.1 9.2  --   NEUTROABS 6.4 5.0  --   HGB 13.5 13.4 11.9*  HCT 39.2 39.7 35.0*  MCV 90.7 90.4  --   PLT 244 275  --    Basic Metabolic Panel:  Recent Labs  Lab 03/02/21 1903 03/06/21 2231 03/07/21 0412  NA 132* 137 138  K 4.1 4.3 3.9  CL 102 107  --   CO2 22 22  --   GLUCOSE 156* 118*  --   BUN 33* 26*  --   CREATININE 1.67* 1.97*  --   CALCIUM 8.5* 8.2*  --    Lipid Panel:  HgbA1c:  Recent Labs  Lab 03/07/21 0624  HGBA1C 6.2*   Urine Drug Screen:  Recent Labs  Lab 03/06/21 2352  LABOPIA NONE DETECTED  COCAINSCRNUR NONE DETECTED  LABBENZ NONE DETECTED  AMPHETMU NONE DETECTED  THCU NONE DETECTED  LABBARB NONE DETECTED    Alcohol Level  Recent Labs  Lab 03/06/21 2231  ETH <10    IMAGING past 24 hours IR [place]  Result Date: 03/07/2021 INDICATION: 87 year old female presents with acute stroke symptoms, corresponding to left M2 inferior division emergent large vessel occlusion. She presents for angiogram and mechanical thrombectomy EXAM: ULTRASOUND-GUIDED ACCESS RIGHT COMMON FEMORAL ARTERY CERVICAL AND CEREBRAL ANGIOGRAM MECHANICAL THROMBECTOMY LEFT MCA BRANCHES ANGIO-SEAL FOR HEMOSTASIS COMPARISON:  CT imaging of the same day MEDICATIONS: 650 mg OG.  Plavix Bolus of kangrelor, with the drip stopped secondary to intracranial hemorrhage ANESTHESIA/SEDATION:  The anesthesia team was present to provide general endotracheal tube anesthesia and for patient monitoring during the procedure. Intubation was performed in room 2/biplane room. Left radial arterial line was performed by the anesthesia team. Interventional neuro radiology nursing staff was also present. CONTRAST:  86 cc FLUOROSCOPY TIME:  Fluoroscopy Time: 26 minutes 54 seconds (1,889 mGy). COMPLICATIONS: SIR LEVEL B - Normal therapy, includes overnight admission for observation., Intracranial hemorrhage TECHNIQUE: Informed written consent was obtained from the patient's family after a thorough discussion of the procedural risks, benefits and alternatives. All questions were addressed. Maximal Sterile Barrier Technique was utilized including caps, mask, sterile gowns, sterile gloves, sterile drape, hand hygiene and skin antiseptic. A timeout was performed prior to the initiation of the procedure. FINDINGS: Initial Findings: Left common carotid artery:  Normal course caliber and contour. Left external carotid artery: Patent with antegrade flow. Left internal carotid artery: Tortuosity of the cervical segment. No significant atherosclerotic changes. Vertical and petrous segment patent with normal course caliber contour. Cavernous segment patent. Clinoid segment patent. Antegrade flow of the ophthalmic artery. Ophthalmic segment patent. Terminus patent. Left MCA: M1 segment patent. Occlusion of the M2 segment inferior division. Patency maintained of the superior division. Left ACA:  A 1 segment patent. A 2 segment perfuses the right territory. Angiogram demonstrates diffuse intracranial atherosclerosis involving the imaged ACA and MCA territory. Completion Findings: Left MCA: After 2 passes of mechanical thrombectomy, there is restoration of flow through the occluded M2 segment. Irregularity at the proximal M2 segment, compatible with a native atherosclerotic plaque. Slow flow within the distal cortical vessels of the  affected dominant inferior division territory. TICI 2c: Near complete perfusion except for slow flow in a few distal cortical vessels/presence of small distal cortical emboli Flat panel CT demonstrates small volume subarachnoid hemorrhage within the frontal sulci, Heidelberg 3c. There is also some staining of the cortex in the parietal region where there appears to be completed infarct in the territory at risk, parietal territory, compatible with Heidleberg PH1. PROCEDURE: The anesthesia team was present to provide general endotracheal tube anesthesia and for patient monitoring during the procedure. Intubation was performed in negative pressure [place] in neuro IR holding. Interventional neuro radiology nursing staff was also present. Ultrasound survey of the right inguinal region was performed with images stored and sent to PACs. Ultrasound confirmed patency of the right common femoral artery. 11 blade scalpel was used to make a small incision. Blunt dissection was performed with US guidance. A micropuncture needle was used access the right common femoral artery under direct ultrasound visualization. With excellent arterial blood flow returned, an .018 micro wire was passed through the needle, observed to enter the abdominal aorta under fluoroscopy. The needle was removed, and a micropuncture sheath was placed over the wire. The inner dilator and wire were removed, and an 035 wire was advanced under fluoroscopy into the abdominal aorta. The sheath was removed and a 25cm 48F straight vascular sheath was placed. The dilator was removed and the sheath was flushed. Sheath was attached to pressurized and heparinized saline bag for constant forward flow. A coaxial system was then advanced over the 035 wire. This included a 95cm 087 "Walrus" balloon guide with coaxial 125cm Berenstein diagnostic catheter. This was advanced to the proximal descending thoracic aorta. Wire was then removed. Double flush of the catheter was  performed. Catheter was then used to select the left common carotid artery. Angiogram was performed. Using roadmap technique, the catheter was advanced over a standard glide wire into left cervical ICA, with distal position achieved of the balloon guide. The diagnostic catheter and the wire were removed. Formal angiogram was performed. Road map function was used once the occluded vessel was identified. Copious back flush was performed and the balloon catheter was attached to heparinized and pressurized saline bag for forward flow. A second coaxial system was then advanced through the balloon catheter, which included the selected intermediate catheter, microcatheter, and microwire. In this scenario, the set up included a 132cm CAT-5 intermediate catheter, a Trevo Provue18 microcatheter, and 014 synchro soft wire. This system was advanced through the balloon guide catheter under the road-map function, with adequate back-flush at the rotating hemostatic valve at that back end of the balloon guide. Microcatheter and the intermediate catheter system were advanced through the terminal ICA and MCA to the level of the occlusion. The micro wire was then carefully advanced through the occluded segment. Microcatheter was then manipulated through the occluded segment and the wire was removed with saline drip at the hub. Blood was then aspirated through the hub of the microcatheter, and a gentle contrast injection was performed confirming intraluminal position. A rotating hemostatic valve was then attached to the back end of the microcatheter, and  a pressurized and heparinized saline bag was attached to the catheter. 4 x 40 solitaire device was then selected. Back flush was achieved at the rotating hemostatic valve, and then the device was gently advanced through the microcatheter to the distal end. The retriever was then unsheathed by withdrawing the microcatheter under fluoroscopy. Once the retriever was completely unsheathed,  the microcatheter was carefully stripped from the delivery device. Control angiogram was performed from the intermediate catheter. A 3 minute time interval was observed. The balloon at the balloon guide catheter was then inflated under fluoroscopy for proximal flow arrest. Constant aspiration using the proprietary engine was then performed at the intermediate catheter, as the retriever was gently and slowly withdrawn with fluoroscopic observation. Once the retriever was "corked" within the tip of the intermediate catheter, both were removed from the system. Free aspiration was confirmed at the hub of the balloon guide catheter, with free blood return confirmed. The balloon was then deflated, and a control angiogram was performed. Restoration of flow was confirmed, with irregularity at the site of the occlusion of the proximal M2 segment, with slow flow within the distal cortical vessels. We elected to observe a 5 minute interval time frame for reimaging, as there was a suspected native intracranial atherosclerotic plaque at the site and we anticipated a rethrombosis after the initial thrombectomy attempt. After 5 minutes angiogram was performed confirming re-occlusion. Anticipating the need for a rescue stent, an orogastric tube was placed, 650 mg of aspirin was administered, and we initiated an IV P2 y12 inhibitor bolus. In order to attempt restoration of flow through the segment, measured the target artery, as well as identify the best site for stenting, we elected to perform an additional stent retriever thrombectomy. The microcatheter was passed with the synchro soft wire through the occluded segment into the parietal branches of the inferior division. The micro wire was carefully advanced through the occluded segment. Microcatheter was then manipulated through the occluded segment and the wire was removed with saline drip at the hub. Gentle injection confirmed intraluminal location within the selected branches.  4 x 40 solitaire device was again selected. Back flush was achieved at the rotating hemostatic valve, and then the device was gently advanced through the microcatheter to the distal end. The retriever was then unsheathed by withdrawing the microcatheter under fluoroscopy. Once the retriever was completely unsheathed, angiogram was performed confirming flow through the segment. While we initiated a measurement of the segment, we observed a 3 minute time interval. Upon withdrawal of the stent tree verb ir, significant tension was observed at the interface/interaction of the stent tree verb ir with the artery, such that there was concern that some troubleshooting might be required in order to remove the stent retriever. A sudden release was observed and the stent retriever was removed from the balloon guide. Repeat angiogram demonstrated restoration of flow, with complete restoration of flow within the territory and slow distal flow within the cortical vessels compatible with TICI 2c: Near complete perfusion except for slow flow in a few distal cortical vessels/presence of small distal cortical emboli. As there was some concern for endothelial injury and narrowing at the site of the atherosclerotic plaque, we elected at this time not to perform a balloon angioplasty or stenting, electing for treatment only with anti-platelet. Balloon guide was withdrawn. Angiogram of the cervical ICA was performed. Balloon guide was then removed. The skin at the puncture site was then cleaned with Chlorhexidine. The 8 French sheath was removed and an  93F angioseal was deployed. Flat panel CT was performed. Patient tolerated the procedure well and remained hemodynamically stable throughout. No complications were encountered and no significant blood loss encountered. IMPRESSION: Status post ultrasound guided access right common femoral artery for left-sided cervical/cerebral angiogram, mechanical thrombectomy of a presumed in-situ  thrombosis of focal atheroscerlotic plaque at the proximal left M2, achieving TICI 2c flow. Angiogram demonstrates diffuse ICAD of the imaged ACA and MCA territory Angio-Seal for hemostasis. Signed, Dulcy Fanny. Dellia Nims, RPVI [facility] Radiology PLAN: The patient will remain intubated, given small volume intraparenchymal hemorrhage ICU status Target systolic blood pressure of 120-140 Right hip straight time 6 hours Frequent neurovascular checks Repeat neurologic imaging with CT and/MRI at the discretion of neurology team Electronically Signed   By: Corrie Mckusick D.O.   On: 03/07/2021 04:23   IR US Guide Vasc Access Right  Result Date: 03/07/2021 INDICATION: 87 year old female presents with acute stroke symptoms, corresponding to left M2 inferior division emergent large vessel occlusion. She presents for angiogram and mechanical thrombectomy EXAM: ULTRASOUND-GUIDED ACCESS RIGHT COMMON FEMORAL ARTERY CERVICAL AND CEREBRAL ANGIOGRAM MECHANICAL THROMBECTOMY LEFT MCA BRANCHES ANGIO-SEAL FOR HEMOSTASIS COMPARISON:  CT imaging of the same day MEDICATIONS: 650 mg OG.  Plavix Bolus of kangrelor, with the drip stopped secondary to intracranial hemorrhage ANESTHESIA/SEDATION: The anesthesia team was present to provide general endotracheal tube anesthesia and for patient monitoring during the procedure. Intubation was performed in room 2/biplane room. Left radial arterial line was performed by the anesthesia team. Interventional neuro radiology nursing staff was also present. CONTRAST:  86 cc FLUOROSCOPY TIME:  Fluoroscopy Time: 26 minutes 54 seconds (1,889 mGy). COMPLICATIONS: SIR LEVEL B - Normal therapy, includes overnight admission for observation., Intracranial hemorrhage TECHNIQUE: Informed written consent was obtained from the patient's family after a thorough discussion of the procedural risks, benefits and alternatives. All questions were addressed. Maximal Sterile  Barrier Technique was utilized including caps, mask, sterile gowns, sterile gloves, sterile drape, hand hygiene and skin antiseptic. A timeout was performed prior to the initiation of the procedure. FINDINGS: Initial Findings: Left common carotid artery:  Normal course caliber and contour. Left external carotid artery: Patent with antegrade flow. Left internal carotid artery: Tortuosity of the cervical segment. No significant atherosclerotic changes. Vertical and petrous segment patent with normal course caliber contour. Cavernous segment patent. Clinoid segment patent. Antegrade flow of the ophthalmic artery. Ophthalmic segment patent. Terminus patent. Left MCA: M1 segment patent. Occlusion of the M2 segment inferior division. Patency maintained of the superior division. Left ACA: A 1 segment patent. A 2 segment perfuses the right territory. Angiogram demonstrates diffuse intracranial atherosclerosis involving the imaged ACA and MCA territory. Completion Findings: Left MCA: After 2 passes of mechanical thrombectomy, there is restoration of flow through the occluded M2 segment. Irregularity at the proximal M2 segment, compatible with a native atherosclerotic plaque. Slow flow within the distal cortical vessels of the affected dominant inferior division territory. TICI 2c: Near complete perfusion except for slow flow in a few distal cortical vessels/presence of small distal cortical emboli Flat panel CT demonstrates small volume subarachnoid hemorrhage within the frontal sulci, Heidelberg 3c. There is also some staining of the cortex in the parietal region where there appears to be completed infarct in the territory at risk, parietal territory, compatible with Heidleberg PH1. PROCEDURE: The anesthesia team was present to provide general endotracheal tube anesthesia and for patient monitoring during the procedure. Intubation was performed in negative pressure [place] in neuro IR holding.  Interventional neuro radiology  nursing staff was also present. Ultrasound survey of the right inguinal region was performed with images stored and sent to PACs. Ultrasound confirmed patency of the right common femoral artery. 11 blade scalpel was used to make a small incision. Blunt dissection was performed with US guidance. A micropuncture needle was used access the right common femoral artery under direct ultrasound visualization. With excellent arterial blood flow returned, an .018 micro wire was passed through the needle, observed to enter the abdominal aorta under fluoroscopy. The needle was removed, and a micropuncture sheath was placed over the wire. The inner dilator and wire were removed, and an 035 wire was advanced under fluoroscopy into the abdominal aorta. The sheath was removed and a 25cm 71F straight vascular sheath was placed. The dilator was removed and the sheath was flushed. Sheath was attached to pressurized and heparinized saline bag for constant forward flow. A coaxial system was then advanced over the 035 wire. This included a 95cm 087 "Walrus" balloon guide with coaxial 125cm Berenstein diagnostic catheter. This was advanced to the proximal descending thoracic aorta. Wire was then removed. Double flush of the catheter was performed. Catheter was then used to select the left common carotid artery. Angiogram was performed. Using roadmap technique, the catheter was advanced over a standard glide wire into left cervical ICA, with distal position achieved of the balloon guide. The diagnostic catheter and the wire were removed. Formal angiogram was performed. Road map function was used once the occluded vessel was identified. Copious back flush was performed and the balloon catheter was attached to heparinized and pressurized saline bag for forward flow. A second coaxial system was then advanced through the balloon catheter, which included the selected intermediate catheter, microcatheter, and microwire. In this scenario, the set  up included a 132cm CAT-5 intermediate catheter, a Trevo Provue18 microcatheter, and 014 synchro soft wire. This system was advanced through the balloon guide catheter under the road-map function, with adequate back-flush at the rotating hemostatic valve at that back end of the balloon guide. Microcatheter and the intermediate catheter system were advanced through the terminal ICA and MCA to the level of the occlusion. The micro wire was then carefully advanced through the occluded segment. Microcatheter was then manipulated through the occluded segment and the wire was removed with saline drip at the hub. Blood was then aspirated through the hub of the microcatheter, and a gentle contrast injection was performed confirming intraluminal position. A rotating hemostatic valve was then attached to the back end of the microcatheter, and a pressurized and heparinized saline bag was attached to the catheter. 4 x 40 solitaire device was then selected. Back flush was achieved at the rotating hemostatic valve, and then the device was gently advanced through the microcatheter to the distal end. The retriever was then unsheathed by withdrawing the microcatheter under fluoroscopy. Once the retriever was completely unsheathed, the microcatheter was carefully stripped from the delivery device. Control angiogram was performed from the intermediate catheter. A 3 minute time interval was observed. The balloon at the balloon guide catheter was then inflated under fluoroscopy for proximal flow arrest. Constant aspiration using the proprietary engine was then performed at the intermediate catheter, as the retriever was gently and slowly withdrawn with fluoroscopic observation. Once the retriever was "corked" within the tip of the intermediate catheter, both were removed from the system. Free aspiration was confirmed at the hub of the balloon guide catheter, with free blood return confirmed. The balloon was then deflated,  and a control  angiogram was performed. Restoration of flow was confirmed, with irregularity at the site of the occlusion of the proximal M2 segment, with slow flow within the distal cortical vessels. We elected to observe a 5 minute interval time frame for reimaging, as there was a suspected native intracranial atherosclerotic plaque at the site and we anticipated a rethrombosis after the initial thrombectomy attempt. After 5 minutes angiogram was performed confirming re-occlusion. Anticipating the need for a rescue stent, an orogastric tube was placed, 650 mg of aspirin was administered, and we initiated an IV P2 y12 inhibitor bolus. In order to attempt restoration of flow through the segment, measured the target artery, as well as identify the best site for stenting, we elected to perform an additional stent retriever thrombectomy. The microcatheter was passed with the synchro soft wire through the occluded segment into the parietal branches of the inferior division. The micro wire was carefully advanced through the occluded segment. Microcatheter was then manipulated through the occluded segment and the wire was removed with saline drip at the hub. Gentle injection confirmed intraluminal location within the selected branches. 4 x 40 solitaire device was again selected. Back flush was achieved at the rotating hemostatic valve, and then the device was gently advanced through the microcatheter to the distal end. The retriever was then unsheathed by withdrawing the microcatheter under fluoroscopy. Once the retriever was completely unsheathed, angiogram was performed confirming flow through the segment. While we initiated a measurement of the segment, we observed a 3 minute time interval. Upon withdrawal of the stent tree verb ir, significant tension was observed at the interface/interaction of the stent tree verb ir with the artery, such that there was concern that some troubleshooting might be required in order to remove the  stent retriever. A sudden release was observed and the stent retriever was removed from the balloon guide. Repeat angiogram demonstrated restoration of flow, with complete restoration of flow within the territory and slow distal flow within the cortical vessels compatible with TICI 2c: Near complete perfusion except for slow flow in a few distal cortical vessels/presence of small distal cortical emboli. As there was some concern for endothelial injury and narrowing at the site of the atherosclerotic plaque, we elected at this time not to perform a balloon angioplasty or stenting, electing for treatment only with anti-platelet. Balloon guide was withdrawn. Angiogram of the cervical ICA was performed. Balloon guide was then removed. The skin at the puncture site was then cleaned with Chlorhexidine. The 8 French sheath was removed and an 82F angioseal was deployed. Flat panel CT was performed. Patient tolerated the procedure well and remained hemodynamically stable throughout. No complications were encountered and no significant blood loss encountered. IMPRESSION: Status post ultrasound guided access right common femoral artery for left-sided cervical/cerebral angiogram, mechanical thrombectomy of a presumed in-situ thrombosis of focal atheroscerlotic plaque at the proximal left M2, achieving TICI 2c flow. Angiogram demonstrates diffuse ICAD of the imaged ACA and MCA territory Angio-Seal for hemostasis. Signed, Dulcy Fanny. Dellia Nims, RPVI [facility] Interventional Radiology Specialists [facility] Radiology PLAN: The patient will remain intubated, given small volume intraparenchymal hemorrhage ICU status Target systolic blood pressure of 120-140 Right hip straight time 6 hours Frequent neurovascular checks Repeat neurologic imaging with CT and/MRI at the discretion of neurology team Electronically Signed   By: Corrie Mckusick D.O.   On: 03/07/2021 04:23   DG CHEST PORT 1 VIEW  Result Date: 03/07/2021 CLINICAL DATA:   Check endotracheal tube placement  EXAM: PORTABLE CHEST 1 VIEW COMPARISON:  09/14/2016 FINDINGS: Check shadow is within normal limits. Aortic calcifications are seen. Endotracheal tube is noted 2 cm above the carina. Lungs are well aerated bilaterally. Minimal left basilar atelectasis is seen. No bony abnormality is noted. IMPRESSION: Endotracheal tube in satisfactory position. Mild left basilar atelectasis. Electronically Signed   By: Inez Catalina M.D.   On: 03/07/2021 04:02   IR PERCUTANEOUS ART THROMBECTOMY/INFUSION INTRACRANIAL INC DIAG ANGIO  Result Date: 03/07/2021 INDICATION: 87 year old female presents with acute stroke symptoms, corresponding to left M2 inferior division emergent large vessel occlusion. She presents for angiogram and mechanical thrombectomy EXAM: ULTRASOUND-GUIDED ACCESS RIGHT COMMON FEMORAL ARTERY CERVICAL AND CEREBRAL ANGIOGRAM MECHANICAL THROMBECTOMY LEFT MCA BRANCHES ANGIO-SEAL FOR HEMOSTASIS COMPARISON:  CT imaging of the same day MEDICATIONS: 650 mg OG.  Plavix Bolus of kangrelor, with the drip stopped secondary to intracranial hemorrhage ANESTHESIA/SEDATION: The anesthesia team was present to provide general endotracheal tube anesthesia and for patient monitoring during the procedure. Intubation was performed in room 2/biplane room. Left radial arterial line was performed by the anesthesia team. Interventional neuro radiology nursing staff was also present. CONTRAST:  86 cc FLUOROSCOPY TIME:  Fluoroscopy Time: 26 minutes 54 seconds (1,889 mGy). COMPLICATIONS: SIR LEVEL B - Normal therapy, includes overnight admission for observation., Intracranial hemorrhage TECHNIQUE: Informed written consent was obtained from the patient's family after a thorough discussion of the procedural risks, benefits and alternatives. All questions were addressed. Maximal Sterile Barrier Technique was utilized including caps, mask, sterile gowns, sterile gloves, sterile drape, hand hygiene and skin  antiseptic. A timeout was performed prior to the initiation of the procedure. FINDINGS: Initial Findings: Left common carotid artery:  Normal course caliber and contour. Left external carotid artery: Patent with antegrade flow. Left internal carotid artery: Tortuosity of the cervical segment. No significant atherosclerotic changes. Vertical and petrous segment patent with normal course caliber contour. Cavernous segment patent. Clinoid segment patent. Antegrade flow of the ophthalmic artery. Ophthalmic segment patent. Terminus patent. Left MCA: M1 segment patent. Occlusion of the M2 segment inferior division. Patency maintained of the superior division. Left ACA: A 1 segment patent. A 2 segment perfuses the right territory. Angiogram demonstrates diffuse intracranial atherosclerosis involving the imaged ACA and MCA territory. Completion Findings: Left MCA: After 2 passes of mechanical thrombectomy, there is restoration of flow through the occluded M2 segment. Irregularity at the proximal M2 segment, compatible with a native atherosclerotic plaque. Slow flow within the distal cortical vessels of the affected dominant inferior division territory. TICI 2c: Near complete perfusion except for slow flow in a few distal cortical vessels/presence of small distal cortical emboli Flat panel CT demonstrates small volume subarachnoid hemorrhage within the frontal sulci, Heidelberg 3c. There is also some staining of the cortex in the parietal region where there appears to be completed infarct in the territory at risk, parietal territory, compatible with Heidleberg PH1. PROCEDURE: The anesthesia team was present to provide general endotracheal tube anesthesia and for patient monitoring during the procedure. Intubation was performed in negative pressure [place] in neuro IR holding. Interventional neuro radiology nursing staff was also present. Ultrasound survey of the right inguinal region was performed with images stored and sent to  PACs. Ultrasound confirmed patency of the right common femoral artery. 11 blade scalpel was used to make a small incision. Blunt dissection was performed with US guidance. A micropuncture needle was used access the right common femoral artery under direct ultrasound visualization. With excellent arterial blood flow returned, an .018 micro  wire was passed through the needle, observed to enter the abdominal aorta under fluoroscopy. The needle was removed, and a micropuncture sheath was placed over the wire. The inner dilator and wire were removed, and an 035 wire was advanced under fluoroscopy into the abdominal aorta. The sheath was removed and a 25cm 67F straight vascular sheath was placed. The dilator was removed and the sheath was flushed. Sheath was attached to pressurized and heparinized saline bag for constant forward flow. A coaxial system was then advanced over the 035 wire. This included a 95cm 087 "Walrus" balloon guide with coaxial 125cm Berenstein diagnostic catheter. This was advanced to the proximal descending thoracic aorta. Wire was then removed. Double flush of the catheter was performed. Catheter was then used to select the left common carotid artery. Angiogram was performed. Using roadmap technique, the catheter was advanced over a standard glide wire into left cervical ICA, with distal position achieved of the balloon guide. The diagnostic catheter and the wire were removed. Formal angiogram was performed. Road map function was used once the occluded vessel was identified. Copious back flush was performed and the balloon catheter was attached to heparinized and pressurized saline bag for forward flow. A second coaxial system was then advanced through the balloon catheter, which included the selected intermediate catheter, microcatheter, and microwire. In this scenario, the set up included a 132cm CAT-5 intermediate catheter, a Trevo Provue18 microcatheter, and 014 synchro soft wire. This system was  advanced through the balloon guide catheter under the road-map function, with adequate back-flush at the rotating hemostatic valve at that back end of the balloon guide. Microcatheter and the intermediate catheter system were advanced through the terminal ICA and MCA to the level of the occlusion. The micro wire was then carefully advanced through the occluded segment. Microcatheter was then manipulated through the occluded segment and the wire was removed with saline drip at the hub. Blood was then aspirated through the hub of the microcatheter, and a gentle contrast injection was performed confirming intraluminal position. A rotating hemostatic valve was then attached to the back end of the microcatheter, and a pressurized and heparinized saline bag was attached to the catheter. 4 x 40 solitaire device was then selected. Back flush was achieved at the rotating hemostatic valve, and then the device was gently advanced through the microcatheter to the distal end. The retriever was then unsheathed by withdrawing the microcatheter under fluoroscopy. Once the retriever was completely unsheathed, the microcatheter was carefully stripped from the delivery device. Control angiogram was performed from the intermediate catheter. A 3 minute time interval was observed. The balloon at the balloon guide catheter was then inflated under fluoroscopy for proximal flow arrest. Constant aspiration using the proprietary engine was then performed at the intermediate catheter, as the retriever was gently and slowly withdrawn with fluoroscopic observation. Once the retriever was "corked" within the tip of the intermediate catheter, both were removed from the system. Free aspiration was confirmed at the hub of the balloon guide catheter, with free blood return confirmed. The balloon was then deflated, and a control angiogram was performed. Restoration of flow was confirmed, with irregularity at the site of the occlusion of the proximal  M2 segment, with slow flow within the distal cortical vessels. We elected to observe a 5 minute interval time frame for reimaging, as there was a suspected native intracranial atherosclerotic plaque at the site and we anticipated a rethrombosis after the initial thrombectomy attempt. After 5 minutes angiogram was performed confirming re-occlusion.  Anticipating the need for a rescue stent, an orogastric tube was placed, 650 mg of aspirin was administered, and we initiated an IV P2 y12 inhibitor bolus. In order to attempt restoration of flow through the segment, measured the target artery, as well as identify the best site for stenting, we elected to perform an additional stent retriever thrombectomy. The microcatheter was passed with the synchro soft wire through the occluded segment into the parietal branches of the inferior division. The micro wire was carefully advanced through the occluded segment. Microcatheter was then manipulated through the occluded segment and the wire was removed with saline drip at the hub. Gentle injection confirmed intraluminal location within the selected branches. 4 x 40 solitaire device was again selected. Back flush was achieved at the rotating hemostatic valve, and then the device was gently advanced through the microcatheter to the distal end. The retriever was then unsheathed by withdrawing the microcatheter under fluoroscopy. Once the retriever was completely unsheathed, angiogram was performed confirming flow through the segment. While we initiated a measurement of the segment, we observed a 3 minute time interval. Upon withdrawal of the stent tree verb ir, significant tension was observed at the interface/interaction of the stent tree verb ir with the artery, such that there was concern that some troubleshooting might be required in order to remove the stent retriever. A sudden release was observed and the stent retriever was removed from the balloon guide. Repeat angiogram  demonstrated restoration of flow, with complete restoration of flow within the territory and slow distal flow within the cortical vessels compatible with TICI 2c: Near complete perfusion except for slow flow in a few distal cortical vessels/presence of small distal cortical emboli. As there was some concern for endothelial injury and narrowing at the site of the atherosclerotic plaque, we elected at this time not to perform a balloon angioplasty or stenting, electing for treatment only with anti-platelet. Balloon guide was withdrawn. Angiogram of the cervical ICA was performed. Balloon guide was then removed. The skin at the puncture site was then cleaned with Chlorhexidine. The 8 French sheath was removed and an 60F angioseal was deployed. Flat panel CT was performed. Patient tolerated the procedure well and remained hemodynamically stable throughout. No complications were encountered and no significant blood loss encountered. IMPRESSION: Status post ultrasound guided access right common femoral artery for left-sided cervical/cerebral angiogram, mechanical thrombectomy of a presumed in-situ thrombosis of focal atheroscerlotic plaque at the proximal left M2, achieving TICI 2c flow. Angiogram demonstrates diffuse ICAD of the imaged ACA and MCA territory Angio-Seal for hemostasis. Signed, Dulcy Fanny. Dellia Nims, RPVI [facility] Radiology PLAN: The patient will remain intubated, given small volume intraparenchymal hemorrhage ICU status Target systolic blood pressure of 120-140 Right hip straight time 6 hours Frequent neurovascular checks Repeat neurologic imaging with CT and/MRI at the discretion of neurology team Electronically Signed   By: Corrie Mckusick D.O.   On: 03/07/2021 04:23   CT HEAD CODE STROKE WO CONTRAST  Result Date: 03/06/2021 CLINICAL DATA:  Code stroke.  Encephalopathy EXAM: CT HEAD WITHOUT CONTRAST TECHNIQUE: Contiguous axial images were obtained from  the base of the skull through the vertex without intravenous contrast. COMPARISON:  None. FINDINGS: Brain: There is no mass, hemorrhage or extra-axial collection. The size and configuration of the ventricles and extra-axial CSF spaces are normal. There is hypoattenuation of the periventricular white matter, most commonly indicating chronic ischemic microangiopathy. Multiple old small vessel infarcts. Vascular: Atherosclerotic calcification of the internal  carotid arteries at the skull base. No abnormal hyperdensity of the major intracranial arteries or dural venous sinuses. Skull: The visualized skull base, calvarium and extracranial soft tissues are normal. Sinuses/Orbits: No fluid levels or advanced mucosal thickening of the visualized paranasal sinuses. No mastoid or middle ear effusion. The orbits are normal. ASPECTS [facility] Stroke Program Early CT Score) - Ganglionic level infarction (caudate, lentiform nuclei, internal capsule, insula, M1-M3 cortex): 7 - Supraganglionic infarction (M4-M6 cortex): 3 Total score (0-10 with 10 being normal): 10 IMPRESSION: 1. No acute intracranial abnormality. 2. ASPECTS is 10. 3. Chronic ischemic microangiopathy and multiple old small vessel infarcts. These results were called by telephone at the time of interpretation on 03/06/2021 at 10:36 pm to provider [facility] , who verbally acknowledged these results. Electronically Signed   By: Ulyses Jarred M.D.   On: 03/06/2021 22:36   CT ANGIO HEAD NECK W WO CM W PERF (CODE STROKE)  Result Date: 03/06/2021 CLINICAL DATA:  Encephalopathy with difficulty speaking EXAM: CT ANGIOGRAPHY HEAD AND NECK CT PERFUSION BRAIN TECHNIQUE: Multidetector CT imaging of the head and neck was performed using the standard protocol during bolus administration of intravenous contrast. Multiplanar CT image reconstructions and MIPs were obtained to evaluate the vascular anatomy. Carotid stenosis measurements (when applicable) are obtained utilizing  NASCET criteria, using the distal internal carotid diameter as the denominator. Multiphase CT imaging of the brain was performed following IV bolus contrast injection. Subsequent parametric perfusion maps were calculated using RAPID software. CONTRAST:  153mL OMNIPAQUE IOHEXOL 350 MG/ML SOLN COMPARISON:  None. FINDINGS: CTA NECK FINDINGS SKELETON: There is no bony spinal canal stenosis. No lytic or blastic lesion. OTHER NECK: Normal pharynx, larynx and major salivary glands. No cervical lymphadenopathy. Unremarkable thyroid gland. UPPER CHEST: No pneumothorax or pleural effusion. No nodules or masses. AORTIC ARCH: There is calcific atherosclerosis of the aortic arch. There is no aneurysm, dissection or hemodynamically significant stenosis of the visualized portion of the aorta. Conventional 3 vessel aortic branching pattern. The visualized proximal subclavian arteries are widely patent. RIGHT CAROTID SYSTEM: Normal without aneurysm, dissection or stenosis. LEFT CAROTID SYSTEM: Normal without aneurysm, dissection or stenosis. VERTEBRAL ARTERIES: Left dominant configuration. Narrowing of the left vertebral artery origin. There is no dissection, occlusion or flow-limiting stenosis to the skull base (V1-V3 segments). CTA HEAD FINDINGS POSTERIOR CIRCULATION: --Vertebral arteries: Normal V4 segments. --Inferior cerebellar arteries: Normal. --Basilar artery: Normal. --Superior cerebellar arteries: Normal. --Posterior cerebral arteries (PCA): Normal. ANTERIOR CIRCULATION: --Intracranial internal carotid arteries: Atherosclerotic calcification of the internal carotid arteries at the skull base without hemodynamically significant stenosis. --Anterior cerebral arteries (ACA): Normal. Both A1 segments are present. Patent anterior communicating artery (a-comm). --Middle cerebral arteries (MCA): There is a proximal left MCA M2 segment occlusion (series 7 images 80 6-88). Otherwise, the middle cerebral arteries are normal. VENOUS  SINUSES: As permitted by contrast timing, patent. ANATOMIC VARIANTS: None Review of the MIP images confirms the above findings. CT Brain Perfusion Findings: ASPECTS: 10 CBF (<30%) Volume: 2mL Perfusion (Tmax>6.0s) volume: 54mL Mismatch Volume: 74mL Infarction Location:Left MCA territory IMPRESSION: Proximal left MCA M2 segment occlusion with 4 mL left MCA territory core infarct and 54 mL ischemic penumbra. Critical Value/emergent results were called by telephone at the time of interpretation on 03/06/2021 at 11:35 pm to provider [facility] , who verbally acknowledged these results. Electronically Signed   By: Ulyses Jarred M.D.   On: 03/06/2021 23:35    PHYSICAL EXAM Vitals with BMI 03/07/2021 03/07/2021 03/07/2021  Height - - -  Weight - - -  BMI - - -  Systolic 161 096 045  Diastolic 59 59 61  Pulse - 61 63     Temp:  [97.8 F (36.6 C)-98.8 F (37.1 C)] 98.8 F (37.1 C) (10/15 1103) Pulse Rate:  [51-77] 61 (10/15 0930) Resp:  [11-23] 18 (10/15 0930) BP: (115-174)/(58-115) 134/59 (10/15 1044) SpO2:  [96 %-100 %] 100 % (10/15 1044) Arterial Line BP: (99-261)/(44-72) 129/59 (10/15 0930) FiO2 (%):  [40 %] 40 % (10/15 1044) Weight:  [69.9 kg] 69.9 kg (10/14 2222)  General - Well developed elderly female intubated, sedated in NAD.   Ophthalmologic - fundi not visualized due to noncooperation.  Cardiovascular - Regular rhythm and rate showing on cardiac monitor.  Mental Status -  Brief eye opening with name calling. Does not keep eyes open, is attentive to examiner and does not follow commands. PERRL. Minimal blink to threat bilaterally. Eyes midline. Intubation impairs face and tongue assessment. She is at least antigravity LUE and LLE. No withdrawal to noxious stimuli RUE, Minimal withdrawal RLE.   ASSESSMENT/PLAN LEMMA TETRO is a 87 y.o. female with PMH significant for HTN, GERD, Hep B, HLD, Obesity, osteoporosis who initially presented to [facility] ED with aphasia. Was  evaluated by teleneurology, LKW of 1815, did not seem like she was eating right at 1830 and difficulty holding a fork. Baseline mRS is a 0. CTH with ASPECTS of 10, CTA with L MCA M2 occlusion with a 54cc penumbra and a 4cc core. She was outside thrombolytics window and was emergently transferred to [facility] for thrombectomy.   Stroke: Left M2 inferior division emergent large vessel occlusion s/p mechanical thrombectomy with TICI 2c result with Dr.Wagner 10/15 0200. Subsequent imaging by CT showing hyperdensity overlying the left frontal, parietal, and occipital lobes not in the location of the infarct seen on the prior CT perfusion raises concern for subarachnoid hemorrhage. Etiology cryptogenic.   Code Stroke CT head  No acute intracranial abnormality. ASPECTS is 10. Chronic ischemic microangiopathy and multiple old small vessel infarcts. CTA head & neck  Proximal left MCA M2 segment occlusion with 4 mL left MCA territory core infarct and 54 mL ischemic penumbra. Cerebral angio  s/p mechanical thrombectomy with TICI 2c result.  Post IR CT  Concern for [facility] with small volume Follow up HCT 10/15 PENDING MRI and MRA  PENDING 12MN 10/16 CT head Repeat 10/15 1. Hyperdensity overlying the left frontal, parietal, and occipital lobes not in the location of the infarct seen on the prior CT perfusion raises concern for subarachnoid hemorrhage. The appearance is similar to the findings on the intra procedural CT. 2. Asymmetric hyperdensity in the left basal ganglia may reflect contrast staining.  2D Echo PENDING LDL 205 HgbA1c 6.2 VTE prophylaxis - SCDs Swallow eval is PENDING    Diet   Diet NPO time specified  Not on anticoagulant or antiplatelet prior to admission No anticoagulant, antiplatelet for the present Obtain STAT Head CT for any neurologic decline  Therapy recommendations:  PENDING stability  Disposition:  TBD        Intubated with mechanical ventilation Intubated  pre-procedure and not extubated 2/2 concern for developing bleed CCM managing, appreciated   Hypertension Home meds:  atenolol, norvasc, irbesartan Unstable, requiring cleviprex infusion, will attempt to transition to oral medications via OGT if obtained. Some limited bradycardia in the 50s noted early this am, monitor, may limit BB use. Strict BP goal 120-140 for now  Hyperlipidemia Home meds: Repatha  listed but reported as not taking, Crestor LDL  goal < 70  High intensity statin  Continue statin at discharge       DM2, controlled  No routine meds but glucotrol prn for "big meal" at home  HgbA1c 6.2, goal < 7.0 CBGs Recent Labs    03/06/21 2223 03/07/21 0409 03/07/21 0713  GLUCAP 111* 221* 159*    SSI       Feeding/Nutrition CCM considering OGT placement for meds and tube feeds        AKI Cre 1.46->1.67->1.97 Hydration with MIVF NS @75cc /hr     Other Stroke Risk Factors Advanced Age >/= 61  Overweight, Body mass index is 27.32 kg/m., BMI >/= 30 associated with increased stroke risk, recommend weight loss, diet and exercise as appropriate   Other Active Problems Anxiety:  Cymbalta daily, Xanax prn on home list (need to sort out her actual use when able) Hypothyroidism: continue home synthroid   Hospital day # 0  Lisa Brooke, NP-C  ATTENDING NOTE: I reviewed above note and agree with the assessment and plan. Pt was seen and examined.   87 year old female with history of hypertension, hyperlipidemia, obesity admitted for aphasia and right hand weakness.  CT no acute abnormality.  CTA head and neck showed left M2 occlusion.  CTP 4 cc core and 52 cc penumbra.  Status post IR with TICI2c revascularization, however also found to have small SAH postprocedure.  Cangrelor discontinued and the patient had aspirin 325.  Repeat CT today confirmed left frontal, parietal and occipital overlaying small SAH.  MRI and MRA pending.  2D echo pending.  LE venous Doppler  pending. A1c 6.2, TG 751, direct LDL 148.   Temp:  [97.8 F (36.6 C)-98.8 F (37.1 C)] 98.8 F (37.1 C) (10/15 1103) Pulse Rate:  [51-80] 76 (10/15 1430) Resp:  [11-23] 17 (10/15 1430) BP: (115-174)/(58-115) 130/59 (10/15 1430) SpO2:  [96 %-100 %] 100 % (10/15 1430) Arterial Line BP: (99-261)/(44-72) 145/61 (10/15 1430) FiO2 (%):  [40 %] 40 % (10/15 1044) Weight:  [69.9 kg] 69.9 kg (10/14 2222)  General - Well nourished, well developed, intubated on sedation.  Ophthalmologic - fundi not visualized due to noncooperation.  Cardiovascular - Regular rate and rhythm.  Neuro - intubated on sedation, eyes closed, briefly open on voice, not following commands. With forced eye opening, eyes in mild position, not blinking to visual threat, doll's eyes sluggish, not tracking, pupil equal size, sluggish to light. Corneal reflex present on the left but not on the right, gag and cough present. Breathing over the vent.  Facial symmetry not able to test due to ET tube.  Tongue protrusion not cooperative. LUE barely against gravity but RUE flaccid. On pain stimulation, withdraw to pain 2/5 on the LLE, slight withdraw to the pain on the RLE. DTR diminished and no babinski. Sensation, coordination and gait not tested.  Etiology for patient stroke currently unclear, however, per Dr. Earleen Newport, he suspect ICAD in situ thrombosis, although occult A. fib still in the differential.  Given SAH, hold off antiplatelet at this time.  We will follow-up with MRI and MRA in a.m.  Appreciate CCM management for ventilation, plan for extubate as able.  BP goal 120-140 within 24 hours of post IR, after that will not relax to less than 160.  Taper off Cleviprex/Cardene as able.  For detailed assessment and plan, please refer to above as I have made changes wherever appropriate.   Rosalin Hawking, MD PhD Stroke Neurology 03/07/2021  2:55 PM  This patient is critically ill due to acute stroke, LVO status post thrombectomy, SAH,  respiratory failure and at significant risk of neurological worsening, death form recurrent stroke, worsening SAH, hemorrhagic conversion, seizure. This patient's care requires constant monitoring of vital signs, hemodynamics, respiratory and cardiac monitoring, review of multiple databases, neurological assessment, discussion with family, other specialists and medical decision making of high complexity. I spent 45 minutes of neurocritical care time in the care of this patient. I had long discussion with son at bedside, updated pt current condition, treatment plan and potential prognosis, and answered all the questions.  He expressed understanding and appreciation.  I also discussed with Dr. Verlee Monte CCM.      To contact Stroke Continuity provider, please refer to [email or web address] After hours, contact General Neurology

## 2021-03-07 NOTE — Progress Notes (Signed)
OT Cancellation Note  Patient Details Name: Lisa Sutton MRN: [id number] DOB: 1933-11-22   Cancelled Treatment:    Reason Eval/Treat Not Completed: Patient not medically ready.  Patient currently remains intubated and on bedrest post sheath.  OT to continue efforts as appropriate.    [name] 03/07/2021, 11:08 AM

## 2021-03-07 NOTE — Progress Notes (Signed)
NeuroInterventional Radiology  Pre-Procedure Note  History: 87 yo female discovered by son at [place] ([place] = 7pm) to be having confusion and word-finding difficulty.    To [facility] ED for evaluation, where stroke neurology/teleneurology did acute work-up.   Baseline mRS: 0  CT ASPECTS:  10 CTA:   Left M2 occlusion/ELVO CTP:   4cc/58cc mismatch   I have discussed the case with Dr. Lorrin Goodell of Stroke Neurology.  Given the patient's symptoms, imaging findings, baseline function, we agree they are an appropriate candidate for attempt for mechanical thrombectomy.    The risks and benefits of the procedure were discussed with the patient/patient's family by telephone, 3-way call connected by our [place] team, with specific risks including: bleeding, infection, arterial injury/dissection, contrast reaction, kidney injury, need for further procedure/surgery, neurologic deficit, 10-15% risk of intracranial hemorrhage, cardiopulmonary collapse, death. All questions were answered.  The patient/family would like to proceed with attempt at thrombectomy.   Plan for cerebral angiogram and attempt at mechanical thrombectomy.   Signed,   Dulcy Fanny. Earleen Newport, DO

## 2021-03-07 NOTE — H&P (Addendum)
NEUROLOGY CONSULTATION NOTE   Date of service: March 07, 2021 Patient Name: Lisa Sutton MRN:  [id number] DOB:  1934-02-07  _ _ _   _ __   _ __ _ _  __ __   _ __   __ _  History of Present Illness  Lisa Sutton is a 87 y.o. female with PMH significant for HTN, GERD, Hep B, HLD, Obesity, osteoporosis who initially presented to [facility] ED with aphasia. Was evaluated by teleneurology, LKW of 1815, did not seem like she was eating right at 1830 and difficulty holding a fork. Baseline mRS is a 0. CTH with ASPECTS of 10, CTA with L MCA M2 occlusion with a 54cc penumbra and a 4cc core. She was outside thrombolytics window and was emergently transferred to [facility] for thrombectomy.  mRS: 0 Thrombolytics: outside window Thrombectomy: Yes, taken to IR for emergent thrombectomy. NIHSS components Score: Comment  1a Level of Conscious 0[x]  1[]  2[]  3[]      1b LOC Questions 0[]  1[]  2[x]       1c LOC Commands 0[]  1[x]  2[]       2 Best Gaze 0[x]  1[]  2[]       3 Visual 0[x]  1[]  2[]  3[]      4 Facial Palsy 0[x]  1[]  2[]  3[]      5a Motor Arm - left 0[x]  1[]  2[]  3[]  4[]  UN[]    5b Motor Arm - Right 0[x]  1[]  2[]  3[]  4[]  UN[]    6a Motor Leg - Left 0[x]  1[]  2[]  3[]  4[]  UN[]    6b Motor Leg - Right 0[x]  1[]  2[]  3[]  4[]  UN[]    7 Limb Ataxia 0[x]  1[]  2[]  3[]  UN[]     8 Sensory 0[x]  1[]  2[]  UN[]      9 Best Language 0[]  1[]  2[x]  3[]      10 Dysarthria 0[x]  1[]  2[]  UN[]      11 Extinct. and Inattention 0[x]  1[]  2[]       TOTAL: 5      ROS   Unable to obtain detailed ROS 2/2 aphasia.  Past History   Past Medical History:  Diagnosis Date  . Anxiety   . Arthritis   . Cataract   . Dysphagia    'sometimes but not a major issue' been checked out by GI (per pt)  . Dysrhythmia    'heart used to skip but doesn't anymore' was checked out by Dr. Ron Parker late '90s, everything checked out ok and not had any skipping since (all per pt)  . Essential hypertension, benign   . Fatty liver   . GERD  (gastroesophageal reflux disease)   . H/O hypercalcemia 04/12/2017  . Hepatitis B    had at age 52, 'GI doc said it's gone away'  . Hepatitis B surface antigen positive   . High cholesterol   . History of hiatal hernia   . Hypercalcemia   . Hypothyroidism   . Kidney stone August 2014   Patient was seen at [facility]  . Obesity   . Osteoporosis   . PONV (postoperative nausea and vomiting)   . Type 2 diabetes mellitus ([place])    Past Surgical History:  Procedure Laterality Date  . ABDOMINAL HYSTERECTOMY    . BILATERAL CARPAL TUNNEL RELEASE     Dr. Marlou Sa at surgical center  . BIOPSY  05/26/2017   Procedure: BIOPSY;  Surgeon: Rogene Houston, MD;  Location: [facility];  Service: Endoscopy;;  colon  . BIOPSY  10/11/2018   Procedure: BIOPSY;  Surgeon: Rogene Houston,  MD;  Location: [facility];  Service: Endoscopy;;  . CATARACT EXTRACTION, BILATERAL    . COLONOSCOPY N/A 05/26/2017   Procedure: COLONOSCOPY;  Surgeon: Rogene Houston, MD;  Location[facility];  Service: Endoscopy;  Laterality: N/A;  10:30  . Deviated septum repair    . ESOPHAGEAL DILATION N/A 08/16/2019   Procedure: ESOPHAGEAL DILATION;  Surgeon: Rogene Houston, MD;  [facility];  Service: Endoscopy;  Laterality: N/A;  . ESOPHAGOGASTRODUODENOSCOPY N/A 07/28/2016   Procedure: ESOPHAGOGASTRODUODENOSCOPY (EGD);  Surgeon: Rogene Houston, MD;  [facility];  Service: Endoscopy;  Laterality: N/A;  300 - moved to 3/7 @ 1:00  . ESOPHAGOGASTRODUODENOSCOPY N/A 08/16/2019   Procedure: ESOPHAGOGASTRODUODENOSCOPY (EGD);  Surgeon: Rogene Houston, MD;  [facility];  Service: Endoscopy;  Laterality: N/A;  230  . EYE SURGERY    . FLEXIBLE SIGMOIDOSCOPY N/A 10/11/2018   Procedure: FLEXIBLE SIGMOIDOSCOPY;  Surgeon: Rogene Houston, MD;  Location: [facility];  Service: Endoscopy;  Laterality: N/A;  unable to complete colonoscopy   . KNEE ARTHROPLASTY Left 09/15/2015   Procedure: COMPUTER ASSISTED  TOTAL KNEE ARTHROPLASTY;  Surgeon: Marybelle Killings, MD;  Location: [place];  Service: Orthopedics;  Laterality: Left;  . KNEE ARTHROSCOPY     left knee 2003   Family History  Problem Relation Age of Onset  . Hypertension Father   . Hip fracture Father   . COPD Mother   . Heart attack Mother   . Cancer Maternal Aunt   . Diabetes Maternal Aunt   . Diabetes Maternal Uncle   . Colon cancer Neg Hx    Social History   Socioeconomic History  . Marital status: Widowed    Spouse name: Not on file  . Number of children: 1  . Years of education: Not on file  . Highest education level: High school graduate  Occupational History    Comment: retired  Tobacco Use  . Smoking status: Never  . Smokeless tobacco: Never  Vaping Use  . Vaping Use: Never used  Substance and Sexual Activity  . Alcohol use: No    Alcohol/week: 0.0 standard drinks  . Drug use: No  . Sexual activity: Never  Other Topics Concern  . Not on file  Social History Narrative   07/04/19 Lives with son   Caffeine- coffee 1 c daily   Social Determinants of Health   Financial Resource Strain: Not on file  Food Insecurity: Not on file  Transportation Needs: Not on file  Physical Activity: Not on file  Stress: Not on file  Social Connections: Not on file   Allergies  Allergen Reactions  . Statins Other (See Comments)    'discomfort, aching everywhere' Tolerates to livalo   . Zetia [Ezetimibe] Other (See Comments)    Leg pain  . Zocor [Simvastatin] Other (See Comments)    Leg pain    Medications   Medications Prior to Admission  Medication Sig Dispense Refill Last Dose  . Accu-Chek Softclix Lancets lancets Check BS BID and PRN 200 each 3   . acetaminophen (TYLENOL) 500 MG tablet Take 1,000 mg by mouth daily as needed for moderate pain or headache.     . Alpha-Lipoic Acid 600 MG CAPS Take 2 capsules by mouth daily.     . Alpha-Lipoic Acid 600 MG TABS Take 600 mg by mouth daily. (Patient not taking: Reported on  03/02/2021)     . ALPRAZolam (XANAX) 0.25 MG tablet Take 1  tablet (0.25 mg total) by mouth 2 (two) times daily as needed for anxiety. (Patient taking differently: Take 0.25 mg by mouth at bedtime as needed (sleep.).) 180 tablet 1   . ALPRAZolam (XANAX) 0.25 MG tablet 1 tablet as needed     . amLODipine (NORVASC) 10 MG tablet Take 10 mg by mouth daily.     Marland Kitchen amLODipine (NORVASC) 10 MG tablet Take 1 tablet by mouth daily.     Marland Kitchen amLODipine (NORVASC) 5 MG tablet Take 1 tablet (5 mg total) by mouth daily. 90 tablet 0   . atenolol (TENORMIN) 50 MG tablet Take 1 tablet (50 mg total) by mouth daily. 90 tablet 1   . b complex vitamins tablet Take 1 tablet by mouth daily.     . Biotin 2500 MCG CAPS Take by mouth daily.     . cephALEXin (KEFLEX) 500 MG capsule Take 1 capsule (500 mg total) by mouth 4 (four) times daily. 28 capsule 0   . Cholecalciferol (VITAMIN D-3) 25 MCG (1000 UT) CAPS 2 capsule     . Cyanocobalamin (VITAMIN B12) 1000 MCG TBCR 1 tablet     . DULoxetine (CYMBALTA) 30 MG capsule Take 1 capsule (30 mg total) by mouth daily. (Patient taking differently: Take 30 mg by mouth at bedtime.) 90 capsule 0   . DULoxetine (CYMBALTA) 30 MG capsule Take 1 capsule by mouth daily.     . Evolocumab (REPATHA) 140 MG/ML SOSY 1 ml (Patient not taking: Reported on 03/02/2021)     . famotidine (PEPCID) 20 MG tablet Take 1 tablet (20 mg total) by mouth at bedtime. (Patient taking differently: Take 20 mg by mouth daily.)     . glipiZIDE (GLUCOTROL) 5 MG tablet TAKE 1/2 TABLET BY MOUTH  DAILY ONLY IF HAVING A  LARGE MEAL AT DINNER 45 tablet 3   . glucose blood (ACCU-CHEK AVIVA PLUS) test strip Test BS 2 times daily Dx E11.21 200 strip 3   . hydrocortisone (ANUSOL-HC) 2.5 % rectal cream Place 1 application rectally 2 (two) times daily. 30 g 1   . ipratropium (ATROVENT) 0.06 % nasal spray 2 sprays into each nostril Three (3) times a day.     . irbesartan (AVAPRO) 150 MG tablet TAKE 1 TABLET BY MOUTH  DAILY (Patient  taking differently: Take 300 mg by mouth daily.) 90 tablet 0   . irbesartan (AVAPRO) 150 MG tablet 1 tablet     . levothyroxine (SYNTHROID) 125 MCG tablet Take 1 tablet (125 mcg total) by mouth daily before breakfast. 90 tablet 3   . levothyroxine (SYNTHROID) 137 MCG tablet Take 137 mcg by mouth daily before breakfast. (Patient not taking: Reported on 03/02/2021)     . pantoprazole (PROTONIX) 40 MG tablet Take 1 tablet (40 mg total) by mouth daily before lunch. 90 tablet 3   . pantoprazole (PROTONIX) 40 MG tablet 1 tablet     . Probiotic Product (PROBIOTIC DAILY PO) Take by mouth daily.     . psyllium (METAMUCIL SMOOTH TEXTURE) 58.6 % powder Take 1 packet by mouth daily. 283 g 12   . rosuvastatin (CRESTOR) 5 MG tablet TAKE 1 TABLET BY MOUTH  DAILY (Patient taking differently: Take 5 mg by mouth at bedtime.) 90 tablet 1   . sodium zirconium cyclosilicate (LOKELMA) 10 g PACK packet See admin instructions.     . Vitamin D, Ergocalciferol, (DRISDOL) 1.25 MG (50000 UNIT) CAPS capsule Take 50,000 Units by mouth once a week.  Vitals   Vitals:   03/06/21 2345 03/07/21 0000 03/07/21 0009 03/07/21 0011  BP: 130/90 (!) 141/115 (!) 141/109 (!) 161/92  Pulse: 73 77 71 74  Resp: (!) 22 18 16  (!) 23  Temp:      TempSrc:      SpO2: 98% 99% 99% 100%  Weight:      Height:         Body mass index is 27.32 kg/m.  Physical Exam   General: Laying comfortably in bed; in no acute distress.  HENT: Normal oropharynx and mucosa. Normal external appearance of ears and nose.  Neck: Supple, no pain or tenderness  CV: No JVD. No peripheral edema.  Pulmonary: Symmetric Chest rise. Normal respiratory effort.  Abdomen: Soft to touch, non-tender.  Ext: No cyanosis, edema, or deformity  Skin: No rash. Normal palpation of skin.   Musculoskeletal: Normal digits and nails by inspection. No clubbing.   Neurologic Examination  Mental status/Cognition: Alert, oriented to self only 2/2 aphasia, good attention.   Speech/language: Non fluent and get stuck on words, intermittently able to comprehend, intermittently able to follow commands. Cranial nerves:   CN II Pupils equal and reactive to light, no VF deficits    CN III,IV,VI EOM intact, no gaze preference or deviation, no nystagmus    CN V normal sensation in V1, V2, and V3 segments bilaterally    CN VII no asymmetry, no nasolabial fold flattening    CN VIII normal hearing to speech    CN IX & X normal palatal elevation, no uvular deviation    CN XI 5/5 head turn and 5/5 shoulder shrug bilaterally    CN XII midline tongue protrusion    Motor:  Muscle bulk: normal, tone normal Mvmt Root Nerve  Muscle Right Left Comments  SA C5/6 Ax Deltoid 5 5   EF C5/6 Mc Biceps     EE C6/7/8 Rad Triceps     WF C6/7 Med FCR     WE C7/8 PIN ECU     F Ab C8/T1 U ADM/FDI 5 5   HF L1/2/3 Fem Illopsoas 5 5   KE L2/3/4 Fem Quad     DF L4/5 D Peron Tib Ant     PF S1/2 Tibial Grc/Sol      Sensation:  Light touch intact   Pin prick    Temperature    Vibration   Proprioception    Coordination/Complex Motor:  - Finger to Nose unable to get her to understand how to do it. - Heel to shin unable to get her to understand how to do it - Rapid alternating movement unable to get her to understand how to do it. - Gait: unable to assess given the acuity of the situation.  Labs   CBC:  Recent Labs  Lab 03/02/21 1903 03/06/21 2231  WBC 10.1 9.2  NEUTROABS 6.4 5.0  HGB 13.5 13.4  HCT 39.2 39.7  MCV 90.7 90.4  PLT 244 416    Basic Metabolic Panel:  Lab Results  Component Value Date   NA 137 03/06/2021   K 4.3 03/06/2021   CO2 22 03/06/2021   GLUCOSE 118 (H) 03/06/2021   BUN 26 (H) 03/06/2021   CREATININE 1.97 (H) 03/06/2021   CALCIUM 8.2 (L) 03/06/2021   GFRNONAA 24 (L) 03/06/2021   GFRAA 38 (L) 07/16/2019   Lipid Panel:  Lab Results  Component Value Date   LDLCALC 205 (H) 07/16/2019   HgbA1c:  Lab Results  Component Value  Date   HGBA1C  6.2 (A) 12/09/2020   Urine Drug Screen:     Component Value Date/Time   LABOPIA NONE DETECTED 03/06/2021 2352   COCAINSCRNUR NONE DETECTED 03/06/2021 2352   LABBENZ NONE DETECTED 03/06/2021 2352   AMPHETMU NONE DETECTED 03/06/2021 2352   THCU NONE DETECTED 03/06/2021 2352   LABBARB NONE DETECTED 03/06/2021 2352    Alcohol Level     Component Value Date/Time   ETH <10 03/06/2021 2231    CT Head without contrast: Personally reviewed and CTH was negative for a large hypodensity concerning for a large territory infarct or hyperdensity concerning for an [place]  CT angio Head and Neck with contrast: Personally reviewed and notable for a L MCA M2 occlusion.  CT perfusion: 54cc mismatch in the L MCA territory and 4cc core.  MRI Brain: pending Impression   Lisa Sutton is a 87 y.o. female with PMH significant for HTN, GERD, Hep B, HLD, Obesity, osteoporosis who initially presented to [facility] ED with receptive greater than expressive aphasia, found to have a L MCA M2 occlusion with a 54cc penumbra and a 4cc core.  Primary Diagnosis:  Cerebral infarction due to embolism of  left middle cerebral artery.   Secondary Diagnosis: Essential (primary) hypertension, Type 2 diabetes mellitus w/o complications, and Obesity  Recommendations   Plan:   - Frequent Neuro checks per NCCU protocol - MRI Brain without contrast - TTE pending. - Lipid panel - will start statin if LDL > 70 - HbA1c - Antithrombotics per Neuro IR post thrombectomy - DVT ppx - Blood pressure goal per Neuro IR for the first 24 hours after thrombectomy - Telemetry monitoring for arrythmia - Bedside swallow screen prior to PO intake. - Stroke education booklet - PT/OT/SLP consult   HTN: BP goal per Neuro IR for the first 24 hours. Will hold home HTN meds.  GERD: PPI  HLD: continue home rosuvastatin.  Hypothyroidism: continue home  synthroid.  ______________________________________________________________________   This patient is critically ill and at significant risk of neurological worsening, death and care requires constant monitoring of vital signs, hemodynamics,respiratory and cardiac monitoring, neurological assessment, discussion with family, other specialists and medical decision making of high complexity. I spent 80 minutes of neurocritical care time  in the care of  this patient. This was time spent independent of any time provided by nurse practitioner or PA.  [place] Pager Number [phone] 03/07/2021  1:14 AM   Signed,  Donnetta Simpers [facility] Pager Number [phone] _ _ _   _ __   _ __ _ _  __ __   _ __   __ _

## 2021-03-07 NOTE — Progress Notes (Signed)
PT Cancellation Note  Patient Details Name: DIANCA OWENSBY MRN: [id number] DOB: 09-21-1933   Cancelled Treatment:    Reason Eval/Treat Not Completed: Medical issues which prohibited therapy (pt currently remains intubated and on bedrest post sheath. Not yet medically appropriate)   Kimberla Driskill B Mayer Vondrak 03/07/2021, 10:47 AM Bayard Males, PT Acute Rehabilitation Services Pager: [phone] Office: [phone]

## 2021-03-07 NOTE — Progress Notes (Signed)
Brief Neuro Update:  Notified by Neuro IR of small volume SAH, early petechial hemorrhage vs contrast staining. Cangrelor was turned off in the IR. Will keep SBP between 120-140 using Cleviprex. Was already given aspirin in the IR. Will discontinue SQ Enoxaparin and get repeat CTH around 5 hours.  [place] Pager Number [phone]

## 2021-03-07 NOTE — Progress Notes (Signed)
No vent changes made per Elink MD .

## 2021-03-07 NOTE — Progress Notes (Signed)
Pt was transported to CT scan and back without complications.  

## 2021-03-07 NOTE — Procedures (Signed)
Extubation Procedure Note  Patient Details:   Name: [name] DOB: [birth year] MRN: [id number]   Airway Documentation:    Vent end date: 03/07/21 Vent end time: 1630   Evaluation  O2 sats: stable throughout Complications: No apparent complications Patient did tolerate procedure well. Bilateral Breath Sounds: Clear, Diminished   Yes  Pt was extubated per Md order and placed on 3 L [place]. Pt had cuff leak prior to extubation and no stridor post. Pt is stable at this time. Rt will monitor.   Ronaldo Miyamoto 03/07/2021, 4:34 PM

## 2021-03-07 NOTE — Anesthesia Procedure Notes (Signed)
Procedure Name: Intubation Date/Time: 03/07/2021 12:57 AM Performed by: Clovis Cao, CRNA Pre-anesthesia Checklist: Patient identified, Emergency Drugs available, Suction available and Patient being monitored Patient Re-evaluated:Patient Re-evaluated prior to induction Oxygen Delivery Method: Circle system utilized Preoxygenation: Pre-oxygenation with 100% oxygen Induction Type: IV induction, Rapid sequence and Cricoid Pressure applied Laryngoscope Size: Miller and 2 Grade View: Grade I Tube type: Oral Tube size: 8.0 mm Number of attempts: 1 Airway Equipment and Method: Stylet Placement Confirmation: ETT inserted through vocal cords under direct vision, positive ETCO2 and breath sounds checked- equal and bilateral Secured at: 22 cm Tube secured with: Tape Dental Injury: Teeth and Oropharynx as per pre-operative assessment

## 2021-03-07 NOTE — ED Provider Notes (Signed)
  Physical Exam  BP (!) 161/92   Pulse 74   Temp 97.8 F (36.6 C) (Oral)   Resp (!) 23   Ht 5\' 3"  (1.6 m)   Wt 69.9 kg   SpO2 100%   BMI 27.32 kg/m   Physical Exam Vitals and nursing note reviewed.  Constitutional:      General: She is not in acute distress.    Appearance: Normal appearance. She is not ill-appearing.  HENT:     Head: Normocephalic and atraumatic.  Pulmonary:     Effort: Pulmonary effort is normal.  Skin:    General: Skin is warm and dry.  Neurological:     Mental Status: She is alert.    ED Course/Procedures     Procedures  MDM  Care assumed from Dr. Almyra Free at shift change.  Patient awaiting recommendations per teleneurology.  Patient last seen normal at 7 PM when her son left to go to the store.  When he returned at approximately 815, she was noted to be confused, having difficulty speaking, and seemed disoriented.  Patient was brought here and code stroke initiated.  She has undergone head CT which was unremarkable, however perfusion study does show M2 segment occlusion with ischemic penumbra of 54 mL.  Arrangements have been made for patient to be transferred to interventional radiology at [facility].  Dr. Earleen Newport has excepted and patient will be transported emergently.       Veryl Speak, MD 03/07/21 [phone]

## 2021-03-07 NOTE — Sedation Documentation (Signed)
Bedside report given to Ginger, RN. Pulses and groin site checked

## 2021-03-07 NOTE — Sedation Documentation (Signed)
Cangrelor stopped per Dr. Earleen Newport

## 2021-03-07 NOTE — Anesthesia Preprocedure Evaluation (Addendum)
Anesthesia Evaluation  Patient identified by MRN, date of birth, ID band Patient confused  Preop documentation limited or incomplete due to emergent nature of procedure.  History of Anesthesia Complications (+) PONV  Airway Mallampati: II  TM Distance: >3 FB Neck ROM: Full    Dental  (+) Teeth Intact   Pulmonary neg pulmonary ROS,    Pulmonary exam normal        Cardiovascular hypertension, Pt. on medications  Rhythm:Regular Rate:Normal     Neuro/Psych Anxiety Code stroke last normal 7pm CVA    GI/Hepatic Neg liver ROS, hiatal hernia, GERD  Medicated,  Endo/Other  diabetes, Type 2Hypothyroidism   Renal/GU negative Renal ROS  negative genitourinary   Musculoskeletal  (+) Arthritis , Osteoarthritis,    Abdominal (+)  Abdomen: soft.    Peds  Hematology   Anesthesia Other Findings   Reproductive/Obstetrics                            Anesthesia Physical Anesthesia Plan  ASA: 4 and emergent  Anesthesia Plan: General   Post-op Pain Management:    Induction: Intravenous, Cricoid pressure planned and Rapid sequence  PONV Risk Score and Plan: 4 or greater and Ondansetron, Dexamethasone and Treatment may vary due to age or medical condition  Airway Management Planned: Mask and Oral ETT  Additional Equipment: Arterial line  Intra-op Plan:   Post-operative Plan: Possible Post-op intubation/ventilation  Informed Consent:     Only emergency history available  Plan Discussed with: CRNA  Anesthesia Plan Comments: (Lab Results      Component                Value               Date                      WBC                      9.2                 03/06/2021                HGB                      13.4                03/06/2021                HCT                      39.7                03/06/2021                MCV                      90.4                03/06/2021                 PLT                      275                 03/06/2021           Lab Results  Component                Value               Date                      NA                       137                 03/06/2021                K                        4.3                 03/06/2021                CO2                      22                  03/06/2021                GLUCOSE                  118 (H)             03/06/2021                BUN                      26 (H)              03/06/2021                CREATININE               1.97 (H)            03/06/2021                CALCIUM                  8.2 (L)             03/06/2021                GFRNONAA                 24 (L)              03/06/2021          )       Anesthesia Quick Evaluation

## 2021-03-07 NOTE — Telephone Encounter (Signed)
Post ED Visit - Positive Culture Follow-up: Successful Patient Follow-Up  Culture assessed and recommendations reviewed by:  []  Elenor Quinones, Pharm.D. []  Heide Guile, Pharm.D., BCPS AQ-ID []  Parks Neptune, Pharm.D., BCPS []  Alycia Rossetti, Pharm.D., BCPS []  Mason, Pharm.D., BCPS, AAHIVP []  Legrand Como, Pharm.D., BCPS, AAHIVP []  Salome Arnt, PharmD, BCPS []  Johnnette Gourd, PharmD, BCPS []  Hughes Better, PharmD, BCPS [x]  Joetta Manners, PharmD  Positive urine culture  []  Patient discharged without antimicrobial prescription and treatment is now indicated [x]  Organism is resistant to prescribed ED discharge antimicrobial []  Patient with positive blood cultures  Changes discussed with ED provider: Eustaquio Maize PA Plan: D/C Cephalexin, not covering organism, No additional antibiotic needed as UA not indicative of ongoing infection.   Contacted patient, date 03/06/2021, time 31  Spoke with patient and patient's son. Patient oriented and speech clear. Patient denies any urinary symptoms or worsening symptoms.  Instructed to d/c Cephalexin and follow-up with PCP, return to the ED for any new or worsening symptoms. Patient and son verbalize understanding and patient states she will follow with PCP on Monday.     [name] Lisa Sutton 03/07/2021, 6:34 AM

## 2021-03-07 NOTE — Progress Notes (Signed)
Code stroke  Call time  [place]   none Exam start  2227 Exam end  2231 Soc   2232 Epic   2232 [place] called  2233

## 2021-03-07 NOTE — Procedures (Addendum)
Neuro-Interventional Radiology  Post Cerebral Angiogram Procedure Note  Operator:    Dr. Earleen Newport Assistant:   None  History:   87 yo female discovered by son at [place] ([place] = 7pm) to be having confusion and word-finding difficulty.  Baseline mRS:  0  Site of occlusion:  Left M2, inferior/dominant division   Procedure: US guided right CFA access Cervical & Cerebral Angiogram Mechanical Thrombectomy Deployment of Angioseal Flat Panel CT in NIR  First Pass Device:  Combination, CAT 5, Solitaire 4x40  Result   TICI 2c  Second Pass Device: Solitaire 4x40  Findings:   Diffuse ICAD.  Initial M2 TICI = 0.  1 pass = TICI 2c, then after 5 minutes is closed.  Suspect ICAD in situ thrombosis.  2nd pass = TICI 2c.    Spin CT: Small volume SAH frontal region.  Early petechial hemorrhage vs contrast stain after infarction of parietal cortex.   Anesthesia:   GETA  EBL:    151VO     Complication:  None   Medication: 650mg  ASA via OG, 58mc/kg bolus of kangrelor, ggt stopped after spin CT.   IA Medication:  none  Recommendations: - right hip straight x 6 hrs - Goal SBP 120-140.   - Frequent NV checks - Repeat CT or MRI imaging recommended within 36 hours, discretion of Neurology, 4-6 hrs reasonable - NIR to follow - Stable, intubated to 3M14 - Continue daily anti-platelets once able, secondary to ICAD  Signed,  Dulcy Fanny. Earleen Newport, DO

## 2021-03-07 NOTE — Progress Notes (Signed)
Received pt from IR. RT placed on current vent settings. Will continue to monitor.

## 2021-03-07 NOTE — Consult Note (Signed)
NAME:  Lisa Sutton, MRN:  [id number], DOB:  04-25-1934, LOS: 0 ADMISSION DATE:  03/06/2021, CONSULTATION DATE:  03/07/21 REFERRING MD:  Lorrin Goodell  CHIEF COMPLAINT:  Acute stroke  History of Present Illness:  Lisa Sutton is a 87 y.o. F with PMH significant for HTN, Type 2 DM, hypothyroidism, GERD who was brought to [facility] with aphasia.  Was last seen well around 1815 when son left for the store.  When he returned pt was having trouble speaking and eating and notably confused.  Code stroke was initiated, was out of the window for TPA, initial head CT negative but CTA with L M2 segment occlusion.  She was transferred to [facility] and taken to IR for mechanical thrombectomy, with TICI 2c.  Repeat CT with small volume SAH in the frontal region,  likely early petechial hemorrhage vs contrast staining, therefore was left intubated and transferred to intensive care.     Pertinent  Medical History   has a past medical history of Anxiety, Arthritis, Cataract, Dysphagia, Dysrhythmia, Essential hypertension, benign, Fatty liver, GERD (gastroesophageal reflux disease), H/O hypercalcemia (04/12/2017), Hepatitis B, Hepatitis B surface antigen positive, High cholesterol, History of hiatal hernia, Hypercalcemia, Hypothyroidism, Kidney stone (August 2014), Obesity, Osteoporosis, PONV (postoperative nausea and vomiting), and Type 2 diabetes mellitus ([place]).   Significant Hospital Events: Including procedures, antibiotic start and stop dates in addition to other pertinent events   10/14 Presented to [facility] ED, code stroke, L M2 occlusion, transfer for thrombectomy. Intubated and not extubated in the setting of possible hemorrhage  Interim History / Subjective:  Pt stable on Cleviprex Minimal Vent settings Starting to move all extremities  Objective   Blood pressure (!) 161/92, pulse 74, temperature 97.8 F (36.6 C), temperature source Oral, resp. rate (!) 23, height 5\' 3"  (1.6 m), weight 69.9 kg, SpO2 100  %.        Intake/Output Summary (Last 24 hours) at 03/07/2021 0338 Last data filed at 03/07/2021 0201 Gross per 24 hour  Intake 300 ml  Output 100 ml  Net 200 ml   Filed Weights   03/06/21 2222  Weight: 69.9 kg    General:  elderly F, well nourished, intubated and sedated HEENT: MM pink/moist, ETT in place, pupils equal Neuro: examined on propofol, withdrawing to pain in bilateral LE, slight spontaneous movement bilat UE, pupils reactive, slightly opening eyes CV: s1s2 rrr, no m/r/g PULM:  clear bilaterally without significant rhonchi or wheezing, on full vent support GI: soft, bsx4 active  Extremities: warm/dry, no edema  Skin: no rashes or lesions  Resolved Hospital Problem list     Assessment & Plan:    Acute CVA secondary to L M2 occlusion Concern for Post-thrombectomy petechial hemorrhage Hypertension Intubated pre-procedure and not extubated 2/2 concern for developing bleed P: -management per neurology, anti-platelet therapy therapy and prophylactic heparin stopped -repeat head CT at 0730 -Continue sedation with Propofol, start prn Fentanyl -SBP goal 140-120, continue Cleviprex  -Check CXR and ABG post-intubation -echo pending --Maintain full vent support with SAT/SBT as tolerated -titrate Vent setting to maintain SpO2 greater than or equal to 90%. -HOB elevated 30 degrees. -Plateau pressures less than 30 cm H20.  -Follow chest x-ray, ABG prn.   -Bronchial hygiene and RT/bronchodilator protocol.   Type 2 DM -SSI   Best Practice (right click and "Reselect all SmartList Selections" daily)   Diet/type: NPO DVT prophylaxis: SCD GI prophylaxis: PPI Lines: N/A Foley:  Yes, and it is still needed Code Status:  full  code Last date of multidisciplinary goals of care discussion []   Labs   CBC: Recent Labs  Lab 03/02/21 1903 03/06/21 2231  WBC 10.1 9.2  NEUTROABS 6.4 5.0  HGB 13.5 13.4  HCT 39.2 39.7  MCV 90.7 90.4  PLT 244 275    Basic  Metabolic Panel: Recent Labs  Lab 03/02/21 1903 03/06/21 2231  NA 132* 137  K 4.1 4.3  CL 102 107  CO2 22 22  GLUCOSE 156* 118*  BUN 33* 26*  CREATININE 1.67* 1.97*  CALCIUM 8.5* 8.2*   GFR: Estimated Creatinine Clearance: 19.6 mL/min (A) (by C-G formula based on SCr of 1.97 mg/dL (H)). Recent Labs  Lab 03/02/21 1903 03/06/21 2231  WBC 10.1 9.2    Liver Function Tests: Recent Labs  Lab 03/02/21 1903 03/06/21 2231  AST 16 19  ALT 13 13  ALKPHOS 67 66  BILITOT 0.6 0.3  PROT 7.1 6.9  ALBUMIN 3.8 3.7   No results for input(s): LIPASE, AMYLASE in the last 168 hours. Recent Labs  Lab 03/02/21 1903  AMMONIA 17    ABG No results found for: PHART, PCO2ART, PO2ART, HCO3, TCO2, ACIDBASEDEF, O2SAT   Coagulation Profile: Recent Labs  Lab 03/06/21 2231  INR 0.9    Cardiac Enzymes: No results for input(s): CKTOTAL, CKMB, CKMBINDEX, TROPONINI in the last 168 hours.  HbA1C: Hemoglobin A1C  Date/Time Value Ref Range Status  12/09/2020 07:01 PM 6.2 (A) 4.0 - 5.6 % Final  07/10/2020 09:19 AM 6.7 (A) 4.0 - 5.6 % Final   HB A1C (BAYER DCA - WAIVED)  Date/Time Value Ref Range Status  02/26/2019 08:51 AM 6.1 <7.0 % Final    Comment:                                          Diabetic Adult            <7.0                                       Healthy Adult        4.3 - 5.7                                                           (DCCT/NGSP) American Diabetes Association's Summary of Glycemic Recommendations for Adults with Diabetes: Hemoglobin A1c <7.0%. More stringent glycemic goals (A1c <6.0%) may further reduce complications at the cost of increased risk of hypoglycemia.   09/27/2018 11:22 AM 6.3 <7.0 % Final    Comment:                                          Diabetic Adult            <7.0                                       Healthy Adult  4.3 - 5.7                                                           (DCCT/NGSP) American Diabetes Association's  Summary of Glycemic Recommendations for Adults with Diabetes: Hemoglobin A1c <7.0%. More stringent glycemic goals (A1c <6.0%) may further reduce complications at the cost of increased risk of hypoglycemia.    Hgb A1c MFr Bld  Date/Time Value Ref Range Status  11/09/2018 11:11 AM 6.3 (H) 4.8 - 5.6 % Final    Comment:             Prediabetes: 5.7 - 6.4          Diabetes: >6.4          Glycemic control for adults with diabetes: <7.0     CBG: Recent Labs  Lab 03/06/21 2223  GLUCAP 111*    Review of Systems:   Unable to obtain secondary to intubation  Past Medical History:  She,  has a past medical history of Anxiety, Arthritis, Cataract, Dysphagia, Dysrhythmia, Essential hypertension, benign, Fatty liver, GERD (gastroesophageal reflux disease), H/O hypercalcemia (04/12/2017), Hepatitis B, Hepatitis B surface antigen positive, High cholesterol, History of hiatal hernia, Hypercalcemia, Hypothyroidism, Kidney stone (August 2014), Obesity, Osteoporosis, PONV (postoperative nausea and vomiting), and Type 2 diabetes mellitus ([place]).   Surgical History:   Past Surgical History:  Procedure Laterality Date   ABDOMINAL HYSTERECTOMY     BILATERAL CARPAL TUNNEL RELEASE     Dr. Marlou Sa at surgical center   BIOPSY  05/26/2017   Procedure: BIOPSY;  Surgeon: Rogene Houston, MD;  Location[facility];  Service: Endoscopy;;  colon   BIOPSY  10/11/2018   Procedure: BIOPSY;  Surgeon: Rogene Houston, MD;  Location[facility];  Service: Endoscopy;;   CATARACT EXTRACTION, BILATERAL     COLONOSCOPY N/A 05/26/2017   Procedure: COLONOSCOPY;  Surgeon: Rogene Houston, MD;  Location[facility];  Service: Endoscopy;  Laterality: N/A;  10:30   Deviated septum repair     ESOPHAGEAL DILATION N/A 08/16/2019   Procedure: ESOPHAGEAL DILATION;  Surgeon: Rogene Houston, MD;  [facility];  Service: Endoscopy;  Laterality: N/A;   ESOPHAGOGASTRODUODENOSCOPY N/A 07/28/2016   Procedure:  ESOPHAGOGASTRODUODENOSCOPY (EGD);  Surgeon: Rogene Houston, MD;  [facility];  Service: Endoscopy;  Laterality: N/A;  300 - moved to 3/7 @ 1:00   ESOPHAGOGASTRODUODENOSCOPY N/A 08/16/2019   Procedure: ESOPHAGOGASTRODUODENOSCOPY (EGD);  Surgeon: Rogene Houston, MD;  [facility];  Service: Endoscopy;  Laterality: N/A;  [place] N/A 10/11/2018   Procedure: FLEXIBLE SIGMOIDOSCOPY;  Surgeon: Rogene Houston, MD;  Location[facility];  Service: Endoscopy;  Laterality: N/A;  unable to complete colonoscopy    KNEE ARTHROPLASTY Left 09/15/2015   Procedure: COMPUTER ASSISTED TOTAL KNEE ARTHROPLASTY;  Surgeon: Marybelle Killings, MD;  Location: [facility];  Service: Orthopedics;  Laterality: Left;   KNEE ARTHROSCOPY     left knee 2003     Social History:   reports that she has never smoked. She has never used smokeless tobacco. She reports that she does not drink alcohol and does not use drugs.   Family History:  Her family history includes COPD in her mother;  Cancer in her maternal aunt; Diabetes in her maternal aunt and maternal uncle; Heart attack in her mother; Hip fracture in her father; Hypertension in her father. There is no history of Colon cancer.   Allergies Allergies  Allergen Reactions   Statins Other (See Comments)    'discomfort, aching everywhere' Tolerates to livalo    Zetia [Ezetimibe] Other (See Comments)    Leg pain   Zocor [Simvastatin] Other (See Comments)    Leg pain     Home Medications  Prior to Admission medications   Medication Sig Start Date End Date Taking? Authorizing Provider  Accu-Chek Softclix Lancets lancets Check BS BID and PRN 07/16/19   Baruch Gouty, FNP  acetaminophen (TYLENOL) 500 MG tablet Take 1,000 mg by mouth daily as needed for moderate pain or headache.    [provider]  Alpha-Lipoic Acid 600 MG CAPS Take 2 capsules by mouth daily.    [provider]  Alpha-Lipoic Acid 600 MG  TABS Take 600 mg by mouth daily. Patient not taking: Reported on 03/02/2021    [provider]  ALPRAZolam Duanne Moron) 0.25 MG tablet Take 1 tablet (0.25 mg total) by mouth 2 (two) times daily as needed for anxiety. Patient taking differently: Take 0.25 mg by mouth at bedtime as needed (sleep.). 07/16/19   Rakes, Connye Burkitt, FNP  ALPRAZolam Duanne Moron) 0.25 MG tablet 1 tablet as needed 08/11/19   [provider]  amLODipine (NORVASC) 10 MG tablet Take 10 mg by mouth daily. 12/09/20   [provider]  amLODipine (NORVASC) 10 MG tablet Take 1 tablet by mouth daily.    [provider]  amLODipine (NORVASC) 5 MG tablet Take 1 tablet (5 mg total) by mouth daily. 10/31/19   Loman Brooklyn, FNP  atenolol (TENORMIN) 50 MG tablet Take 1 tablet (50 mg total) by mouth daily. 05/23/19   Baruch Gouty, FNP  b complex vitamins tablet Take 1 tablet by mouth daily.    [provider]  Biotin 2500 MCG CAPS Take by mouth daily.    [provider]  cephALEXin (KEFLEX) 500 MG capsule Take 1 capsule (500 mg total) by mouth 4 (four) times daily. 03/02/21   Milton Ferguson, MD  Cholecalciferol (VITAMIN D-3) 25 MCG (1000 UT) CAPS 2 capsule    [provider]  Cyanocobalamin (VITAMIN B12) 1000 MCG TBCR 1 tablet    [provider]  DULoxetine (CYMBALTA) 30 MG capsule Take 1 capsule (30 mg total) by mouth daily. Patient taking differently: Take 30 mg by mouth at bedtime. 05/23/19   Baruch Gouty, FNP  DULoxetine (CYMBALTA) 30 MG capsule Take 1 capsule by mouth daily. 10/05/19   [provider]  Evolocumab (REPATHA) 140 MG/ML SOSY 1 ml Patient not taking: Reported on 03/02/2021 11/04/20   [provider]  famotidine (PEPCID) 20 MG tablet Take 1 tablet (20 mg total) by mouth at bedtime. Patient taking differently: Take 20 mg by mouth daily. 08/16/19   Rehman, Mechele Dawley, MD  glipiZIDE (GLUCOTROL) 5 MG tablet TAKE 1/2 TABLET BY MOUTH  DAILY ONLY IF  HAVING A  LARGE MEAL AT [facility] 04/22/20   Philemon Kingdom, MD  glucose blood (ACCU-CHEK AVIVA PLUS) test strip Test BS 2 times daily Dx E11.21 05/14/19   Baruch Gouty, FNP  hydrocortisone (ANUSOL-HC) 2.5 % rectal cream Place 1 application rectally 2 (two) times daily. 08/05/20   Rehman, Mechele Dawley, MD  ipratropium (ATROVENT) 0.06 % nasal spray 2 sprays into each  nostril Three (3) times a day. 10/25/19   [provider]  irbesartan (AVAPRO) 150 MG tablet TAKE 1 TABLET BY MOUTH  DAILY Patient taking differently: Take 300 mg by mouth daily. 10/30/19   Claretta Fraise, MD  irbesartan (AVAPRO) 150 MG tablet 1 tablet 08/29/19   [provider]  levothyroxine (SYNTHROID) 125 MCG tablet Take 1 tablet (125 mcg total) by mouth daily before breakfast. 01/16/21   Philemon Kingdom, MD  levothyroxine (SYNTHROID) 137 MCG tablet Take 137 mcg by mouth daily before breakfast. Patient not taking: Reported on 03/02/2021 09/18/19   [provider]  pantoprazole (PROTONIX) 40 MG tablet Take 1 tablet (40 mg total) by mouth daily before lunch. 08/05/20   Rogene Houston, MD  pantoprazole (PROTONIX) 40 MG tablet 1 tablet 10/23/19   [provider]  Probiotic Product (PROBIOTIC DAILY PO) Take by mouth daily.    [provider]  psyllium (METAMUCIL SMOOTH TEXTURE) 58.6 % powder Take 1 packet by mouth daily. 10/11/18   Rehman, Mechele Dawley, MD  rosuvastatin (CRESTOR) 5 MG tablet TAKE 1 TABLET BY MOUTH  DAILY Patient taking differently: Take 5 mg by mouth at bedtime. 05/23/19   Baruch Gouty, FNP  sodium zirconium cyclosilicate (LOKELMA) 10 g PACK packet See admin instructions. 02/25/21 02/25/22  [provider]  Vitamin D, Ergocalciferol, (DRISDOL) 1.25 MG (50000 UNIT) CAPS capsule Take 50,000 Units by mouth once a week. 04/28/20   [provider]  calcium citrate-vitamin D 200-200 MG-UNIT TABS Take 1 tablet by mouth daily.    08/10/11  [provider]     Critical  care time: 35 minutes     CRITICAL CARE Performed by: Otilio Carpen Annali Lybrand   Total critical care time: 35 minutes  Critical care time was exclusive of separately billable procedures and treating other patients.  Critical care was necessary to treat or prevent imminent or life-threatening deterioration.  Critical care was time spent personally by me on the following activities: development of treatment plan with patient and/or surrogate as well as nursing, discussions with consultants, evaluation of patient's response to treatment, examination of patient, obtaining history from patient or surrogate, ordering and performing treatments and interventions, ordering and review of laboratory studies, ordering and review of radiographic studies, pulse oximetry and re-evaluation of patient's condition.  Otilio Carpen Kurt Hoffmeier, PA-C [facility] See Amion for pager If no response to pager , please call [phone] until 7pm After 7:00 pm call [facility]  [phone]

## 2021-03-07 NOTE — Progress Notes (Signed)
SLP Cancellation Note  Patient Details Name: Lisa Sutton MRN: [id number] DOB: 1934/02/12   Cancelled treatment:       Reason Eval/Treat Not Completed: Patient not medically ready.  Pt is intubated at this time.  SLP will f/u as appropriate.    Elvia Collum Terek Bee 03/07/2021, 10:52 AM

## 2021-03-07 NOTE — Progress Notes (Signed)
Notified [facility] of ABG results.  

## 2021-03-07 NOTE — Transfer of Care (Signed)
Immediate Anesthesia Transfer of Care Note  Patient: Lisa Sutton  Procedure(s) Performed: IR WITH ANESTHESIA  Patient Location: ICU  Anesthesia Type:General  Level of Consciousness: sedated and Patient remains intubated per anesthesia plan  Airway & Oxygen Therapy: Patient remains intubated per anesthesia plan and Patient placed on Ventilator (see vital sign flow sheet for setting)  Post-op Assessment: Report given to RN and Post -op Vital signs reviewed and stable  Post vital signs: Reviewed and stable  Last Vitals:  Vitals Value Taken Time  BP    Temp    Pulse    Resp    SpO2 100 % 03/07/21 0312    Last Pain:  Vitals:   03/06/21 2222  TempSrc:   PainSc: 0-No pain         Complications: No notable events documented.

## 2021-03-08 ENCOUNTER — Inpatient Hospital Stay (HOSPITAL_COMMUNITY): Payer: Medicare Other

## 2021-03-08 DIAGNOSIS — I6389 Other cerebral infarction: Secondary | ICD-10-CM

## 2021-03-08 DIAGNOSIS — I161 Hypertensive emergency: Secondary | ICD-10-CM

## 2021-03-08 DIAGNOSIS — I63512 Cerebral infarction due to unspecified occlusion or stenosis of left middle cerebral artery: Secondary | ICD-10-CM | POA: Diagnosis not present

## 2021-03-08 DIAGNOSIS — I10 Essential (primary) hypertension: Secondary | ICD-10-CM | POA: Diagnosis not present

## 2021-03-08 DIAGNOSIS — I639 Cerebral infarction, unspecified: Secondary | ICD-10-CM

## 2021-03-08 LAB — BASIC METABOLIC PANEL
Anion gap: 10 (ref 5–15)
BUN: 19 mg/dL (ref 8–23)
CO2: 17 mmol/L — ABNORMAL LOW (ref 22–32)
Calcium: 7.6 mg/dL — ABNORMAL LOW (ref 8.9–10.3)
Chloride: 112 mmol/L — ABNORMAL HIGH (ref 98–111)
Creatinine, Ser: 1.78 mg/dL — ABNORMAL HIGH (ref 0.44–1.00)
GFR, Estimated: 28 mL/min — ABNORMAL LOW (ref >60)
Glucose, Bld: 128 mg/dL — ABNORMAL HIGH (ref 70–99)
Potassium: 3.9 mmol/L (ref 3.5–5.1)
Sodium: 139 mmol/L (ref 135–145)

## 2021-03-08 LAB — GLUCOSE, CAPILLARY
Glucose-Capillary: 118 mg/dL — ABNORMAL HIGH (ref 70–99)
Glucose-Capillary: 140 mg/dL — ABNORMAL HIGH (ref 70–99)
Glucose-Capillary: 96 mg/dL (ref 70–99)
Glucose-Capillary: 98 mg/dL (ref 70–99)
Glucose-Capillary: 141 mg/dL — ABNORMAL HIGH (ref 70–99)
Glucose-Capillary: 134 mg/dL — ABNORMAL HIGH (ref 70–99)

## 2021-03-08 LAB — ECHOCARDIOGRAM COMPLETE
AR max vel: 2.44 cm2
AV Area VTI: 2.57 cm2
AV Area mean vel: 2.23 cm2
AV Mean grad: 6 mmHg
AV Peak grad: 10.4 mmHg
Ao pk vel: 1.61 m/s
Area-P 1/2: 3.07 cm2
Height: 63 in
MV VTI: 2.77 cm2
S' Lateral: 2.7 cm
Weight: 2497.37 oz

## 2021-03-08 LAB — TRIGLYCERIDES: Triglycerides: 216 mg/dL — ABNORMAL HIGH (ref <150)

## 2021-03-08 LAB — CBC
HCT: 36.3 % (ref 36.0–46.0)
Hemoglobin: 12.6 g/dL (ref 12.0–15.0)
MCH: 31 pg (ref 26.0–34.0)
MCHC: 34.7 g/dL (ref 30.0–36.0)
MCV: 89.4 fL (ref 80.0–100.0)
Platelets: 217 10*3/uL (ref 150–400)
RBC: 4.06 MIL/uL (ref 3.87–5.11)
RDW: 12.8 % (ref 11.5–15.5)
WBC: 11.5 10*3/uL — ABNORMAL HIGH (ref 4.0–10.5)
nRBC: 0 % (ref 0.0–0.2)

## 2021-03-08 MED ORDER — ASPIRIN EC 325 MG PO TBEC
325.0000 mg | DELAYED_RELEASE_TABLET | Freq: Every day | ORAL | Status: DC
  Administered 2021-03-08 – 2021-03-13 (×6): 325 mg via ORAL
  Filled 2021-03-08 (×6): qty 1

## 2021-03-08 MED ORDER — HEPARIN SODIUM (PORCINE) 5000 UNIT/ML IJ SOLN
5000.0000 [IU] | Freq: Three times a day (TID) | INTRAMUSCULAR | Status: DC
  Administered 2021-03-08 – 2021-03-09 (×4): 5000 [IU] via SUBCUTANEOUS
  Filled 2021-03-08 (×4): qty 1

## 2021-03-08 MED ORDER — PANTOPRAZOLE SODIUM 40 MG PO TBEC
40.0000 mg | DELAYED_RELEASE_TABLET | Freq: Every day | ORAL | Status: DC
  Administered 2021-03-08 – 2021-03-13 (×6): 40 mg via ORAL
  Filled 2021-03-08 (×6): qty 1

## 2021-03-08 MED ORDER — ATENOLOL 25 MG PO TABS
25.0000 mg | ORAL_TABLET | Freq: Every day | ORAL | Status: DC
  Administered 2021-03-08 – 2021-03-13 (×6): 25 mg via ORAL
  Filled 2021-03-08 (×6): qty 1

## 2021-03-08 MED ORDER — LABETALOL HCL 5 MG/ML IV SOLN: 5.0000 mg | INTRAVENOUS | Status: DC | PRN

## 2021-03-08 MED ORDER — CLOPIDOGREL BISULFATE 75 MG PO TABS
75.0000 mg | ORAL_TABLET | Freq: Every day | ORAL | Status: DC
  Administered 2021-03-08 – 2021-03-13 (×6): 75 mg via ORAL
  Filled 2021-03-08 (×6): qty 1

## 2021-03-08 MED ORDER — AMLODIPINE BESYLATE 10 MG PO TABS
10.0000 mg | ORAL_TABLET | Freq: Every day | ORAL | Status: DC
  Administered 2021-03-09 – 2021-03-13 (×5): 10 mg via ORAL
  Filled 2021-03-08 (×6): qty 1

## 2021-03-08 MED ORDER — ASPIRIN EC 81 MG PO TBEC: 81.0000 mg | DELAYED_RELEASE_TABLET | Freq: Every day | ORAL | Status: DC

## 2021-03-08 NOTE — Evaluation (Signed)
Clinical/Bedside Swallow Evaluation Patient Details  Name: Lisa Sutton MRN: [id number] Date of Birth: 1933-06-03  Today's Date: 03/08/2021 Time: SLP Start Time (ACUTE ONLY): 2094 SLP Stop Time (ACUTE ONLY): 1018 SLP Time Calculation (min) (ACUTE ONLY): 23 min  Past Medical History:  Past Medical History:  Diagnosis Date   Anxiety    Arthritis    Cataract    Dysphagia    'sometimes but not a major issue' been checked out by GI (per pt)   Dysrhythmia    'heart used to skip but doesn't anymore' was checked out by Dr. Ron Parker late '90s, everything checked out ok and not had any skipping since (all per pt)   Essential hypertension, benign    Fatty liver    GERD (gastroesophageal reflux disease)    H/O hypercalcemia 04/12/2017   Hepatitis B    had at age 69, 'GI doc said it's gone away'   Hepatitis B surface antigen positive    High cholesterol    History of hiatal hernia    Hypercalcemia    Hypothyroidism    Kidney stone August 2014   Patient was seen at [facility]   Obesity    Osteoporosis    PONV (postoperative nausea and vomiting)    Type 2 diabetes mellitus ([place])    Past Surgical History:  Past Surgical History:  Procedure Laterality Date   ABDOMINAL HYSTERECTOMY     BILATERAL CARPAL TUNNEL RELEASE     Dr. Marlou Sa at surgical center   BIOPSY  05/26/2017   Procedure: BIOPSY;  Surgeon: Rogene Houston, MD;  [facility];  Service: Endoscopy;;  colon   BIOPSY  10/11/2018   Procedure: BIOPSY;  Surgeon: Rogene Houston, MD;  [facility];  Service: Endoscopy;;   CATARACT EXTRACTION, BILATERAL     COLONOSCOPY N/A 05/26/2017   Procedure: COLONOSCOPY;  Surgeon: Rogene Houston, MD;  [facility];  Service: Endoscopy;  Laterality: N/A;  10:30   Deviated septum repair     ESOPHAGEAL DILATION N/A 08/16/2019   Procedure: ESOPHAGEAL DILATION;  Surgeon: Rogene Houston, MD;  [facility];  Service: Endoscopy;  Laterality: N/A;    ESOPHAGOGASTRODUODENOSCOPY N/A 07/28/2016   Procedure: ESOPHAGOGASTRODUODENOSCOPY (EGD);  Surgeon: Rogene Houston, MD;  [facility];  Service: Endoscopy;  Laterality: N/A;  300 - moved to 3/7 @ 1:00   ESOPHAGOGASTRODUODENOSCOPY N/A 08/16/2019   Procedure: ESOPHAGOGASTRODUODENOSCOPY (EGD);  Surgeon: Rogene Houston, MD;  [facility];  Service: Endoscopy;  Laterality: N/A;  [place] N/A 10/11/2018   Procedure: FLEXIBLE SIGMOIDOSCOPY;  Surgeon: Rogene Houston, MD;  [facility];  Service: Endoscopy;  Laterality: N/A;  unable to complete colonoscopy    IR CT HEAD LTD  03/07/2021   IR PERCUTANEOUS ART THROMBECTOMY/INFUSION INTRACRANIAL INC DIAG ANGIO  03/07/2021   IR US GUIDE VASC ACCESS RIGHT  03/07/2021   KNEE ARTHROPLASTY Left 09/15/2015   Procedure: COMPUTER ASSISTED TOTAL KNEE ARTHROPLASTY;  Surgeon: Marybelle Killings, MD;  Location: [facility];  Service: Orthopedics;  Laterality: Left;   KNEE ARTHROSCOPY     left knee 2003   HPI:  Lisa Sutton is a 87 y.o. female with PMH significant for HTN, GERD, Hep B, HLD, Obesity, osteoporosis who initially presented to [facility] ED with aphasia.  CT angio was done which showed left M2 occlusion, she was brought to [facility], underwent  mechanical thrombectomy. MRI was remarkable for acute infarcts in the L MCA territory and right parietal cortex.  Pt was intubated less than 24 hrs on 10/15.  Pt has a hx of esophageal dysfunction.    Assessment / Plan / Recommendation  Clinical Impression  Pt was seen for a bedside swallow evaluation in the setting of acute infarcts and brief intubation.  Pt was encountered awake/alert with granddaughter present at bedside.  Pt was noted to have expressive and receptive language deficits (full speech/language evaluation pending) and she therefore had some difficulty completing an oral mechanism examination.  Lingual deviation to the R was noted upon  protrusion, possibly indicative of weakness.  No additional oral motor deficits noted.  Pt consumed trials of ice chips, thin liquid, puree, and regular solids.  Mastication was effective and AP transit appeared timely.  Pt exhibited a delayed cough following 1/2 regular solids trials; however, granddaughter reported a baseline cough in the absence of PO and pt has a known hx of esophageal dysfunction.  Recommend initiation of Dysphagia 3 (soft) solids and thin liquids with medications administered whole with thin liquid or in puree (per pt and RN preference).  SLP will f/u to monitor diet tolerance and for comprehensive speech/language evaluation.  SLP Visit Diagnosis: Dysphagia, unspecified (R13.10)    Aspiration Risk  Mild aspiration risk    Diet Recommendation Thin liquid;Dysphagia 3 (Mech soft)   Liquid Administration via: Cup;Straw Medication Administration: Whole meds with liquid Compensations: Slow rate;Small sips/bites Postural Changes: Seated upright at 90 degrees;Remain upright for at least 30 minutes after po intake    Other  Recommendations Oral Care Recommendations: Oral care BID    Recommendations for follow up therapy are one component of a multi-disciplinary discharge planning process, led by the attending physician.  Recommendations may be updated based on patient status, additional functional criteria and insurance authorization.  Follow up Recommendations Other (comment) (TBD)      Frequency and Duration min 2x/week  2 weeks       Prognosis Prognosis for Safe Diet Advancement: Good      Swallow Study   General HPI: Lisa Sutton is a 87 y.o. female with PMH significant for HTN, GERD, Hep B, HLD, Obesity, osteoporosis who initially presented to [facility] ED with aphasia.  CT angio was done which showed left M2 occlusion, she was brought to [facility], underwent mechanical thrombectomy. MRI was remarkable for acute infarcts in the L MCA territory and  right parietal cortex.  Pt was intubated less than 24 hrs on 10/15.  Pt has a hx of esophageal dysphagia. Type of Study: Bedside Swallow Evaluation Previous Swallow Assessment: NA Diet Prior to this Study: NPO Temperature Spikes Noted: No Respiratory Status: Room air History of Recent Intubation: Yes Length of Intubations (days):  (<1) Date extubated: 03/07/21 Behavior/Cognition: Alert;Cooperative;Pleasant mood Oral Cavity Assessment: Within Functional Limits Oral Care Completed by SLP: No Oral Cavity - Dentition: Adequate natural dentition Vision: Functional for self-feeding Self-Feeding Abilities: Able to feed self;Needs set up Patient Positioning: Upright in bed Baseline Vocal Quality: Normal Volitional Cough: Strong Volitional Swallow: Able to elicit    Oral/Motor/Sensory Function Overall Oral Motor/Sensory Function: Mild impairment Facial ROM: Within Functional Limits Facial Symmetry: Within Functional Limits Facial Sensation: Within Functional Limits Lingual Symmetry: Abnormal symmetry right   Ice Chips Ice chips: Within functional limits Presentation: Spoon   Thin Liquid Thin Liquid: Within functional limits Presentation: Straw;Spoon;Self Fed;Cup    Nectar Thick Nectar Thick Liquid: Not tested  Honey Thick Honey Thick Liquid: Not tested   Puree Puree: Within functional limits Presentation: Spoon   Solid     Solid: Impaired Presentation: Self Fed Pharyngeal Phase Impairments: Cough - Delayed     Colin Mulders M.S., CCC-SLP Acute Rehabilitation Services Office: [phone]  [place] 03/08/2021,10:36 AM

## 2021-03-08 NOTE — Evaluation (Signed)
Physical Therapy Evaluation Patient Details Name: Lisa Sutton MRN: [id number] DOB: [birth year] Today's Date: 03/08/2021  History of Present Illness  87 y.o. female presents to [facility] hospital 03/06/2021 with aphasia. CTH demonstrates L MCA M2 occlusion. Pt underwent emergent thrombectomy on 03/07/2021. PMH includes HTN, GERD, Hep B, HLD, Obesity, osteoporosis.  Clinical Impression  Pt presents to PT with deficits in strength, power, gait, functional mobility, communication, cognition. Pt with expressive and receptive aphasia, is able to inconsistently follow verbal commands and has difficulty following visual cues. Pt demonstrates imbalance in standing with one significant loss of balance when ambulating. Pt will benefit from continued aggressive mobilization to improve balance and reduce falls risk. PT recommends CIR placement at this time as the pt was independent prior to admission and demonstrates the potential to make significant functional gains with high intensity inpatient PT services.     Recommendations for follow up therapy are one component of a multi-disciplinary discharge planning process, led by the attending physician.  Recommendations may be updated based on patient status, additional functional criteria and insurance authorization.  Follow Up Recommendations CIR    Equipment Recommendations   (TBD)    Recommendations for Other Services Rehab consult     Precautions / Restrictions Precautions Precautions: Fall Precaution Comments: expressive and receptive aphasia Restrictions Weight Bearing Restrictions: No      Mobility  Bed Mobility Overal bed mobility: Needs Assistance Bed Mobility: Supine to Sit     Supine to sit: Supervision          Transfers Overall transfer level: Needs assistance Equipment used: None Transfers: Sit to/from Stand Sit to Stand: Min guard            Ambulation/Gait Ambulation/Gait assistance: Mod assist Gait Distance  (Feet): 20 Feet Assistive device: None Gait Pattern/deviations: Step-to pattern Gait velocity: reduced Gait velocity interpretation: <1.31 ft/sec, indicative of household ambulator General Gait Details: pt with slowed step-to gait, increased lateral sway and posterior loss of balance requiring modA to correct  Stairs            Wheelchair Mobility    Modified Rankin (Stroke Patients Only)       Balance Overall balance assessment: Needs assistance Sitting-balance support: No upper extremity supported;Feet supported Sitting balance-Leahy Scale: Fair     Standing balance support: No upper extremity supported Standing balance-Leahy Scale: Poor Standing balance comment: min-modA                             Pertinent Vitals/Pain Pain Assessment: No/denies pain    Home Living Family/patient expects to be discharged to:: Private residence Living Arrangements: Children Available Help at Discharge: Family;Available 24 hours/day Type of Home: House Home Access: Stairs to enter Entrance Stairs-Rails: None Entrance Stairs-Number of Steps: 4 Home Layout: Two level;Able to live on main level with bedroom/bathroom;Laundry or work area in [employer]: None      Prior Function Level of Independence: Independent         Comments: driving     Journalist, newspaper        Extremity/Trunk Assessment   Upper Extremity Assessment Upper Extremity Assessment: Difficult to assess due to impaired cognition;RUE deficits/detail;LUE deficits/detail RUE Deficits / Details: at least 4/5 based on observed mobility, difficult to formally assess 2/2 aphasia LUE Deficits / Details: at least 4/5 based on observed mobility, difficult to formally assess 2/2 aphasia    Lower Extremity Assessment Lower Extremity Assessment: RLE deficits/detail;LLE deficits/detail;Difficult  to assess due to impaired cognition RLE Deficits / Details: at least 4/5 based on observed mobility,  difficult to formally assess 2/2 aphasia LLE Deficits / Details: at least 4/5 based on observed mobility, difficult to formally assess 2/2 aphasia    Cervical / Trunk Assessment Cervical / Trunk Assessment: Normal  Communication   Communication: No difficulties  Cognition Arousal/Alertness: Awake/alert Behavior During Therapy: WFL for tasks assessed/performed Overall Cognitive Status: Difficult to assess Area of Impairment: Orientation;Memory;Following commands;Safety/judgement;Awareness;Problem solving                 Orientation Level:  (pt is unable to report birthdate)   Memory: Decreased recall of precautions;Decreased short-term memory Following Commands: Follows one step commands inconsistently;Follows multi-step commands inconsistently (impaired command following 2/2 aphasia) Safety/Judgement: Decreased awareness of safety;Decreased awareness of deficits Awareness: Emergent Problem Solving: Difficulty sequencing;Requires verbal cues;Requires tactile cues        General Comments General comments (skin integrity, edema, etc.): VSS on RA    Exercises     Assessment/Plan    PT Assessment Patient needs continued PT services  PT Problem List Decreased strength;Decreased activity tolerance;Decreased balance;Decreased mobility;Decreased knowledge of precautions;Decreased safety awareness;Decreased cognition       PT Treatment Interventions DME instruction;Gait training;Stair training;Functional mobility training;Therapeutic activities;Therapeutic exercise;Balance training;Neuromuscular re-education;Patient/family education    PT Goals (Current goals can be found in the Care Plan section)  Acute Rehab PT Goals Patient Stated Goal: to return to independence PT Goal Formulation: With patient/family Time For Goal Achievement: 03/22/21 Potential to Achieve Goals: Good    Frequency Min 4X/week   Barriers to discharge        Co-evaluation                AM-PAC PT "6 Clicks" Mobility  Outcome Measure Help needed turning from your back to your side while in a flat bed without using bedrails?: A Little Help needed moving from lying on your back to sitting on the side of a flat bed without using bedrails?: A Little Help needed moving to and from a bed to a chair (including a wheelchair)?: A Little Help needed standing up from a chair using your arms (e.g., wheelchair or bedside chair)?: A Little Help needed to walk in hospital room?: A Lot Help needed climbing 3-5 steps with a railing? : A Lot 6 Click Score: 16    End of Session   Activity Tolerance: Patient tolerated treatment well Patient left: in chair;with call bell/phone within reach;with chair alarm set Nurse Communication: Mobility status PT Visit Diagnosis: Other abnormalities of gait and mobility (R26.89);Unsteadiness on feet (R26.81);Muscle weakness (generalized) (M62.81);Other symptoms and signs involving the nervous system (R29.898)    Time: 7619-5093 PT Time Calculation (min) (ACUTE ONLY): 36 min   Charges:   PT Evaluation $PT Eval Low Complexity: [place], PT, DPT Acute Rehabilitation Pager: [phone] Office [phone]   Zenaida Niece 03/08/2021, 4:09 PM

## 2021-03-08 NOTE — Progress Notes (Signed)
  Echocardiogram 2D Echocardiogram has been performed.  Lisa Sutton 03/08/2021, 2:27 PM

## 2021-03-08 NOTE — Progress Notes (Signed)
Inpatient Rehab Admissions Coordinator:   Per therapy recommendations,  patient was screened for CIR candidacy by Clemens Catholic, MS, CCC-SLP. At this time, Pt. Appears to demonstrate medical necessity, functional decline, and ability to tolerate intensity of CIR. Pt. is a potential candidate for CIR. I will placee   order for rehab consult per protocol for full assessment. Please contact me any with questions.Clemens Catholic, [place], [place] Admissions Coordinator  [phone] ([place]) [phone] (office)

## 2021-03-08 NOTE — Progress Notes (Addendum)
STROKE TEAM PROGRESS NOTE   INTERVAL HISTORY 3 family members at bedside.  Extubated, cardene infusion restarted and then weaned off. Bradycardia has resolved with HR 70s-80s. Tmax 99.1.Creatinine trending down to 1.78. Has passed for diet. Today patient is sitting up in bed smiling, attempting to speak with obvious word finding difficulty, seems to have both receptive and expressive aphasia with significant impairment. R hemibody weakness present but improved from intubated/sedated exam yesterday.  We discussed her stroke diagnosis, ongoing work up and plan of care. Son reports she did not take Repatha at home more than a week because it made her legs hurt. Their questions were answered.   Vitals:   03/08/21 0600 03/08/21 0635 03/08/21 0740 03/08/21 0900  BP: (S) (!) 132/99 127/65  (!) 141/67  Pulse: 85 82  85  Resp: 18 15  15   Temp:   98.2 F (36.8 C)   TempSrc:   Axillary   SpO2: 96% 92%  93%  Weight: 70.8 kg     Height:       CBC:  Recent Labs  Lab 03/02/21 1903 03/06/21 2231 03/07/21 0412 03/08/21 0426  WBC 10.1 9.2  --  11.5*  NEUTROABS 6.4 5.0  --   --   HGB 13.5 13.4 11.9* 12.6  HCT 39.2 39.7 35.0* 36.3  MCV 90.7 90.4  --  89.4  PLT 244 275  --  010   Basic Metabolic Panel:  Recent Labs  Lab 03/06/21 2231 03/07/21 0412 03/08/21 0426  NA 137 138 139  K 4.3 3.9 3.9  CL 107  --  112*  CO2 22  --  17*  GLUCOSE 118*  --  128*  BUN 26*  --  19  CREATININE 1.97*  --  1.78*  CALCIUM 8.2*  --  7.6*   Lipid Panel:  HgbA1c:  Recent Labs  Lab 03/07/21 0624  HGBA1C 6.2*   Urine Drug Screen:  Recent Labs  Lab 03/06/21 2352  LABOPIA NONE DETECTED  COCAINSCRNUR NONE DETECTED  LABBENZ NONE DETECTED  AMPHETMU NONE DETECTED  THCU NONE DETECTED  LABBARB NONE DETECTED    Alcohol Level  Recent Labs  Lab 03/06/21 2231  ETH <10    IMAGING past 24 hours MR ANGIO HEAD WO CONTRAST  Result Date: 03/08/2021 CLINICAL DATA:  Stroke follow-up EXAM: MRI HEAD  WITHOUT CONTRAST MRA HEAD WITHOUT CONTRAST TECHNIQUE: Multiplanar, multi-echo pulse sequences of the brain and surrounding structures were acquired without intravenous contrast. Angiographic images of the Circle of Willis were acquired using MRA technique without intravenous contrast. COMPARISON:  Yesterday FINDINGS: MRI HEAD FINDINGS Brain: Extensive patchy cortical and white matter infarction in the left cerebral hemisphere, greatest in the superior division region centered at the frontal parietal junction. Few acute cortical infarcts in the right parietooccipital cortex. Minimal if any petechial hemorrhage in the left parietal region. No hematoma, hydrocephalus, shift. Confluent chronic small vessel ischemia in the deep white matter and to a lesser extent in the pons. Chronic perforator infarcts in the right corona radiata. Vascular: See below Skull and upper cervical spine: Normal marrow signal Sinuses/Orbits: Bilateral cataract resection MRA HEAD FINDINGS Anterior circulation: The carotid arteries are atheromatous but diffusely patent. No branch occlusion, beading, or aneurysm. Severe left M2 branch stenosis with flow gap, also highlighted on prior CTA. Intracranial atherosclerosis is extensive and advanced. Right M2 branch is also highly stenotic. Posterior circulation: Vertebral and basilar arteries are diffusely patent. Diffuse atheromatous irregularity posterior cerebral arteries high-grade narrowing the right PCA bifurcation  IMPRESSION: Brain MRI: 1. Patchy acute infarcts clustered in the superior division left MCA territory but also seen elsewhere in the left MCA territory and to a limited extent the right parietal cortex. 2. Background of advanced chronic small vessel disease. Intracranial MRA: Severe and generalized intracranial atherosclerosis with high-grade bilateral M2 segment narrowings. No progression from CTA yesterday. Electronically Signed   By: Jorje Guild M.D.   On: 03/08/2021 05:43    MR BRAIN WO CONTRAST  Result Date: 03/08/2021 CLINICAL DATA:  Stroke follow-up EXAM: MRI HEAD WITHOUT CONTRAST MRA HEAD WITHOUT CONTRAST TECHNIQUE: Multiplanar, multi-echo pulse sequences of the brain and surrounding structures were acquired without intravenous contrast. Angiographic images of the Circle of Willis were acquired using MRA technique without intravenous contrast. COMPARISON:  Yesterday FINDINGS: MRI HEAD FINDINGS Brain: Extensive patchy cortical and white matter infarction in the left cerebral hemisphere, greatest in the superior division region centered at the frontal parietal junction. Few acute cortical infarcts in the right parietooccipital cortex. Minimal if any petechial hemorrhage in the left parietal region. No hematoma, hydrocephalus, shift. Confluent chronic small vessel ischemia in the deep white matter and to a lesser extent in the pons. Chronic perforator infarcts in the right corona radiata. Vascular: See below Skull and upper cervical spine: Normal marrow signal Sinuses/Orbits: Bilateral cataract resection MRA HEAD FINDINGS Anterior circulation: The carotid arteries are atheromatous but diffusely patent. No branch occlusion, beading, or aneurysm. Severe left M2 branch stenosis with flow gap, also highlighted on prior CTA. Intracranial atherosclerosis is extensive and advanced. Right M2 branch is also highly stenotic. Posterior circulation: Vertebral and basilar arteries are diffusely patent. Diffuse atheromatous irregularity posterior cerebral arteries high-grade narrowing the right PCA bifurcation IMPRESSION: Brain MRI: 1. Patchy acute infarcts clustered in the superior division left MCA territory but also seen elsewhere in the left MCA territory and to a limited extent the right parietal cortex. 2. Background of advanced chronic small vessel disease. Intracranial MRA: Severe and generalized intracranial atherosclerosis with high-grade bilateral M2 segment narrowings. No  progression from CTA yesterday. Electronically Signed   By: Jorje Guild M.D.   On: 03/08/2021 05:43   DG CHEST PORT 1 VIEW  Result Date: 03/08/2021 CLINICAL DATA:  Acute respiratory failure with hypoxia. EXAM: PORTABLE CHEST 1 VIEW COMPARISON:  Chest x-rays dated 03/07/2021 and 01/14/2017. FINDINGS: Endotracheal tube has been removed since yesterday's exam. Heart size an RIGHT lung is clear. Osseous structures about the chest are unremarkable. D mediastinal contours are grossly stable. Probable mild atelectasis and/or small pleural effusion at the LEFT lung base. No pneumothorax is seen. IMPRESSION: 1. Interval extubation. 2. Probable mild atelectasis and/or small pleural effusion at the LEFT lung base. 3. No evidence of pneumonia or pulmonary edema. Electronically Signed   By: Franki Cabot M.D.   On: 03/08/2021 06:51   DG Abd Portable 1V  Result Date: 03/07/2021 CLINICAL DATA:  Orogastric tube placement EXAM: PORTABLE ABDOMEN - 1 VIEW COMPARISON:  01/02/2014 FINDINGS: Limited radiograph of the lower chest and upper abdomen was obtained for the purposes of enteric tube localization. Enteric tube is seen coursing below the diaphragm with distal tip and side port terminating within the expected location of the distal gastric body. Excreted contrast is noted within the renal collecting systems and urinary bladder. IMPRESSION: Enteric tube within the distal gastric body. Electronically Signed   By: Davina Poke D.O.   On: 03/07/2021 13:18    PHYSICAL EXAM Vitals with BMI 03/08/2021 03/08/2021 03/08/2021  Height - - -  Weight - - 156 lbs 1 oz  BMI - - 26.94  Systolic 854 627 035  Diastolic 67 65 99  Pulse 85 82 85     Temp:  [98.2 F (36.8 C)-99.1 F (37.3 C)] 98.2 F (36.8 C) (10/16 0740) Pulse Rate:  [71-90] 85 (10/16 0900) Resp:  [11-20] 15 (10/16 0900) BP: (101-144)/(57-99) 141/67 (10/16 0900) SpO2:  [92 %-100 %] 93 % (10/16 0900) Arterial Line BP: (140-158)/(56-63) 145/61  (10/15 1430) FiO2 (%):  [40 %] 40 % (10/15 1503) Weight:  [70.8 kg-71 kg] 70.8 kg (10/16 0600)  General - Well developed elderly female sitting up in bed in NAD.   Ophthalmologic - fundi not visualized due to noncooperation.  Cardiovascular - Regular rhythm and rate showing on cardiac monitor.  Mental Status -  Alert, able to state she is in the hospital and her name. Her expressive and receptive aphasia limits her ability to follow commands for exam. She is able to follow simple commands some of the time but mimics more effectively. Not able to follow complex commands.   Cranial Nerves II - XII - II - Visual field intact OU appears impaired on the right, but not able to follow commands to full assess . III, IV, VI - Extraocular movements intact. V - Facial sensation testing impaired by aphasia VII - Facial movement intact bilaterally. VIII - Hearing grossly intact bilaterally. X - Palate elevates symmetrically. XI - Chin turning intact XII - Tongue protrusion assessment impaired by aphasia.  Motor Strength - The patient's strength was normal LUE and LLE but impaired proximally on the RUE with approximate 3/5 strength and RLE 3/5 but formal testing not possible with aphasia. She was able to lift RLE off bed but not able to maintain past count of 2 with drift back to bed.  Bulk was normal and fasciculations were absent.   Motor Tone - Muscle tone was assessed at the neck and appendages and was normal.  Sensory - Unable to assess due to aphasia  Coordination - Spontaneously moving with purpose and effectively on the left, some limited spontaneous shifting in bed movement on the right during exam.   Gait and Station - deferred.   ASSESSMENT/PLAN Lisa Sutton is a 87 y.o. female with PMH significant for HTN, GERD, Hep B, HLD, Obesity, osteoporosis who initially presented to [facility] ED with aphasia. Was evaluated by teleneurology, LKW of 1815, did not seem like she was eating  right at 1830 and difficulty holding a fork. Baseline mRS is a 0. CTH with ASPECTS of 10, CTA with L MCA M2 occlusion with a 54cc penumbra and a 4cc core. She was outside thrombolytics window and was emergently transferred to [facility] for thrombectomy.   Stroke: Left M2 inferior division emergent large vessel occlusion s/p mechanical thrombectomy with TICI 2c result with Dr.Wagner 10/15 0200. Subsequent imaging by CT showing hyperdensity overlying the left frontal, parietal, and occipital lobes not in the location of the infarct seen on the prior CT perfusion raises concern for subarachnoid hemorrhage. Etiology cryptogenic.   Code Stroke CT head  No acute intracranial abnormality. ASPECTS is 10. Chronic ischemic microangiopathy and multiple old small vessel infarcts. CTA head & neck  Proximal left MCA M2 segment occlusion with 4 mL left MCA territory core infarct and 54 mL ischemic penumbra. Cerebral angio  s/p mechanical thrombectomy with TICI 2c result.  Post IR CT  Concern for [facility] with small volume Follow up HCT 10/15 PENDING MRI and  MRA   1. Patchy acute infarcts clustered in the superior division left MCA territory but also seen elsewhere in the left MCA territory and to a limited extent the right parietal cortex. 2. Background of advanced chronic small vessel disease. Intracranial MRA: Severe and generalized intracranial atherosclerosis with high-grade bilateral M2 segment narrowings. No progression from CTA yesterday. CT head Repeat 10/15 1. Hyperdensity overlying the left frontal, parietal, and occipital lobes not in the location of the infarct seen on the prior CT perfusion raises concern for subarachnoid hemorrhage. The appearance is similar to the findings on the intra procedural CT. 2. Asymmetric hyperdensity in the left basal ganglia may reflect contrast staining.  2D Echo PENDING Bilat LE [place] PENDING LDL 205 HgbA1c 6.2 VTE prophylaxis - SCDs    Diet   DIET DYS 3  Room service appropriate? Yes with Assist; Fluid consistency: Thin  Not on anticoagulant or antiplatelet prior to admission Start DAPT with ASA 81 and Plavix 75mg  today  Obtain STAT Head CT for any neurologic decline  Therapy recommendations:  PENDING stability  Disposition:  TBD        Intubated with mechanical ventilation Intubated pre-procedure and not initially extubated 2/2 concern for developing bleed Extubated 10/15 1635  Hypertension Home meds:  atenolol, norvasc, irbesartan Unstable, requiring cardene infusion, will attempt to transition to oral medications now that she is able to take po. Some limited bradycardia in the 50s noted early this am, monitor, may limit BB use. BP goal is 180/105  for now  Hyperlipidemia Home meds: Repatha listed but reported as not taking, family reports stopped after one week due leg aches. Chart review shows notes stating she is "statin intolerant".  LDL direct 147.9, goal < 70  High intensity statin under consideration, she is currently on Crestor 5mg .  Continue statin at discharge       DM2, controlled  No routine meds but glucotrol prn for "big meal" at home  HgbA1c 6.2, goal < 7.0 CBGs stable  Recent Labs    03/07/21 2316 03/08/21 0346 03/08/21 0735  GLUCAP 122* 118* 140*    SSI       Feeding/Nutrition Has passed swallow evaluation for dysphagia 3 diet Monitor intake        AKI Cre 1.46->1.67->1.97->1.78 Saline locked MIVF Now on diet, monitor intake      Other Stroke Risk Factors Advanced Age >/= 47  Overweight, Body mass index is 27.65 kg/m., BMI >/= 30 associated with increased stroke risk, recommend weight loss, diet and exercise as appropriate   Other Active Problems Anxiety:  Cymbalta daily, Xanax prn on home list (need to sort out her actual use when able) Hypothyroidism: continue home synthroid   Hospital day # 1 This plan of care was directed by Dr. Doristine Devoid, NP-C  ATTENDING NOTE: I reviewed  above note and agree with the assessment and plan. Pt was seen and examined.   Pt son and granddaughter are at the bedside. Pt was extubated yesterday afternoon, tolerating well. Today sitting in chair, awake alert, still has word finding difficulty and difficulty following commands but moving all extremities. BP stable. Passed swallow and on diet.    On exam, pt awake, alert, eyes open, difficulty with spontaneous speech, and following commands, consistent with global aphasia, occasional with short sentences. Not able to name but able to repeat 3-5 word sentences. No significant dysarthria. Not orientated to time, place or age but orientated to people. No gaze palsy, tracking bilaterally, blinking to  visual threat bilaterally, PERRL. No facial droop. Tongue protrusion not cooperative. RUE mild pronator drift, however, b/l finger grip symmetrical. BLEs 3+/5.  Sensation not cooperative, b/l FTN intact, gait not tested.   MRI brain showed left MCA patchy scattered infarct moderate size, but no SAH seen on SWI. MRA showed continued left M2 occlusion and b/l M2 stenosis. EF 60-65% and DVT negative. Her stroke most likely large vessel disease given b/l M2 atherosclerosis, which is also the impression from Dr. Earleen Newport. Will started ASA 325 and plavix DAPT for 3 months and then ASA alone. Lovenox for DVT prophylaxis. Now on home crestor 5 and will continue her repatha as outpt. PT/OT recommend CIR. Also recommend 30 day cardiac event monitoring as outpt to rule out afib.   For detailed assessment and plan, please refer to above as I have made changes wherever appropriate.   Rosalin Hawking, MD PhD Stroke Neurology 03/08/2021 4:14 PM  This patient is critically ill due to left MCA stroke due to left M2 occlusion s/p IR and at significant risk of neurological worsening, death form recurrent stroke, hemorrhagic conversion, seizure. This patient's care requires constant monitoring of vital signs, hemodynamics,  respiratory and cardiac monitoring, review of multiple databases, neurological assessment, discussion with family, other specialists and medical decision making of high complexity. I spent 40 minutes of neurocritical care time in the care of this patient. I had long discussion with son and granddaughter at bedside, updated pt current condition, treatment plan and potential prognosis, and answered all the questions. They expressed understanding and appreciation.    To contact Stroke Continuity provider, please refer to [email or web address] After hours, contact General Neurology

## 2021-03-08 NOTE — Consult Note (Signed)
NAME:  Lisa Sutton, MRN:  [id number], DOB:  10-08-1933, LOS: 1 ADMISSION DATE:  03/06/2021, CONSULTATION DATE:  03/08/21 REFERRING MD:  Lorrin Goodell  CHIEF COMPLAINT:  Acute stroke  History of Present Illness:  Lisa Sutton is a 87 y.o. F with PMH significant for HTN, Type 2 DM, hypothyroidism, GERD who was brought to [facility] ED with aphasia.  Was last seen well around 1815 when son left for the store.  When he returned pt was having trouble speaking and eating and notably confused.  Code stroke was initiated, was out of the window for TPA, initial head CT negative but CTA with L M2 segment occlusion.  She was transferred to [facility] and taken to IR for mechanical thrombectomy, with TICI 2c.  Repeat CT with small volume SAH in the frontal region,  likely early petechial hemorrhage vs contrast staining, therefore was left intubated and transferred to intensive care.     Pertinent  Medical History   has a past medical history of Anxiety, Arthritis, Cataract, Dysphagia, Dysrhythmia, Essential hypertension, benign, Fatty liver, GERD (gastroesophageal reflux disease), H/O hypercalcemia (04/12/2017), Hepatitis B, Hepatitis B surface antigen positive, High cholesterol, History of hiatal hernia, Hypercalcemia, Hypothyroidism, Kidney stone (August 2014), Obesity, Osteoporosis, PONV (postoperative nausea and vomiting), and Type 2 diabetes mellitus ([place]).   Significant Hospital Events: Including procedures, antibiotic start and stop dates in addition to other pertinent events   10/14 Presented to [facility] ED, code stroke, L M2 occlusion, transfer for thrombectomy. Intubated and not extubated in the setting of possible hemorrhage 10/15 Extubated   Interim History / Subjective:  Remains on cleviprex at 6mcg, improved need No acute issues overnight Working with SLP for diet clearance   Objective   Blood pressure (!) 141/67, pulse 85, temperature 98.2 F (36.8 C), temperature source Axillary, resp. rate 15,  height 5\' 3"  (1.6 m), weight 70.8 kg, SpO2 93 %.    Vent Mode: PSV;CPAP FiO2 (%):  [40 %] 40 % Set Rate:  [18 bmp] 18 bmp Vt Set:  [420 mL] 420 mL PEEP:  [5 cmH20] 5 cmH20 Pressure Support:  [10 cmH20] 10 cmH20 Plateau Pressure:  [15 cmH20-16 cmH20] 15 cmH20   Intake/Output Summary (Last 24 hours) at 03/08/2021 1000 Last data filed at 03/08/2021 0600 Gross per 24 hour  Intake 1449.37 ml  Output 900 ml  Net 549.37 ml   Filed Weights   03/06/21 2222 03/07/21 2000 03/08/21 0600  Weight: 69.9 kg 71 kg 70.8 kg   Exam:  General:  adult female lying in bed in NAD HEENT: MM pink/moist, no jvd, good dentition  Neuro: Awake, alert, difficulty with word finding, pupils equal/reactive, able to move all extremities to commands, residual 4/5 weakness on right CV: s1s2 RRR, 2/6 SEM PULM: non-labored at rest, lungs bilaterally clear, on room air GI: soft, bsx4 active  Extremities: warm/dry, no edema  Skin: no rashes or lesions  Resolved Hospital Problem list     Assessment & Plan:   Acute CVA secondary to L M2 occlusion Post-thrombectomy SAH Hypertension Intubated pre-procedure and not extubated 2/2 with concern for developing bleed.  CT head confirms subarachnoid hemorrhage.   -follow neuro exam, assess CT head if neurologic change  -hold DAPT given hemorrhage  -SBP 120-140 goal, defer goals to Neurology  -wean cleviprex off for above goal  -continue norvasc, irbesartan -resume atenolol at half home dose and follow BP trend  -pt passed SLP evaluation, advance diet as tolerated   -await ECHO findings  AKI  -  Trend BMP / urinary output -Replace electrolytes as indicated -Avoid nephrotoxic agents, ensure adequate renal perfusion  Type 2 DM -SSI    Best Practice (right click and "Reselect all SmartList Selections" daily)  Diet/type: dysphagia diet (see orders) DVT prophylaxis: SCD GI prophylaxis: PPI Lines: N/A Foley:  Yes, and it is still needed Code Status:  full  code Last date of multidisciplinary goals of care discussion: [place], MSN, APRN, NP-C, AGACNP-BC [facility] 03/08/2021, 10:00 AM   Please see [email or web address] for pager details.   From 7A-7P if no response, please call [phone] After hours, please call ELink [phone]

## 2021-03-08 NOTE — Anesthesia Postprocedure Evaluation (Signed)
Anesthesia Post Note  Patient: Lisa Sutton  Procedure(s) Performed: IR WITH ANESTHESIA     Patient location during evaluation: SICU Anesthesia Type: General Level of consciousness: sedated Pain management: pain level controlled Vital Signs Assessment: post-procedure vital signs reviewed and stable Respiratory status: patient remains intubated per anesthesia plan Cardiovascular status: stable Postop Assessment: no apparent nausea or vomiting Anesthetic complications: no   No notable events documented.  Last Vitals:  Vitals:   03/08/21 1118 03/08/21 1130  BP:  132/69  Pulse:  88  Resp:  (!) 25  Temp: 36.7 C   SpO2:  94%    Last Pain:  Vitals:   03/08/21 1118  TempSrc: Axillary  PainSc:                  March Rummage Archita Lomeli

## 2021-03-08 NOTE — Progress Notes (Signed)
VASCULAR LAB    Bilateral lower extremity venous duplex has been performed.  See CV proc for preliminary results.   Rashon Rezek, RVT 03/08/2021, 11:06 AM

## 2021-03-09 ENCOUNTER — Encounter (HOSPITAL_COMMUNITY): Payer: Self-pay | Admitting: Interventional Radiology

## 2021-03-09 DIAGNOSIS — I63512 Cerebral infarction due to unspecified occlusion or stenosis of left middle cerebral artery: Secondary | ICD-10-CM | POA: Diagnosis not present

## 2021-03-09 DIAGNOSIS — I161 Hypertensive emergency: Secondary | ICD-10-CM | POA: Diagnosis not present

## 2021-03-09 DIAGNOSIS — I639 Cerebral infarction, unspecified: Secondary | ICD-10-CM

## 2021-03-09 DIAGNOSIS — E782 Mixed hyperlipidemia: Secondary | ICD-10-CM | POA: Diagnosis not present

## 2021-03-09 LAB — BASIC METABOLIC PANEL
Anion gap: 6 (ref 5–15)
BUN: 17 mg/dL (ref 8–23)
CO2: 19 mmol/L — ABNORMAL LOW (ref 22–32)
Calcium: 7.8 mg/dL — ABNORMAL LOW (ref 8.9–10.3)
Chloride: 112 mmol/L — ABNORMAL HIGH (ref 98–111)
Creatinine, Ser: 1.71 mg/dL — ABNORMAL HIGH (ref 0.44–1.00)
GFR, Estimated: 29 mL/min — ABNORMAL LOW (ref >60)
Glucose, Bld: 125 mg/dL — ABNORMAL HIGH (ref 70–99)
Potassium: 3.7 mmol/L (ref 3.5–5.1)
Sodium: 137 mmol/L (ref 135–145)

## 2021-03-09 LAB — GLUCOSE, CAPILLARY
Glucose-Capillary: 119 mg/dL — ABNORMAL HIGH (ref 70–99)
Glucose-Capillary: 155 mg/dL — ABNORMAL HIGH (ref 70–99)
Glucose-Capillary: 98 mg/dL (ref 70–99)
Glucose-Capillary: 116 mg/dL — ABNORMAL HIGH (ref 70–99)
Glucose-Capillary: 220 mg/dL — ABNORMAL HIGH (ref 70–99)

## 2021-03-09 LAB — CBC
HCT: 33.7 % — ABNORMAL LOW (ref 36.0–46.0)
Hemoglobin: 11.2 g/dL — ABNORMAL LOW (ref 12.0–15.0)
MCH: 30.1 pg (ref 26.0–34.0)
MCHC: 33.2 g/dL (ref 30.0–36.0)
MCV: 90.6 fL (ref 80.0–100.0)
Platelets: 217 10*3/uL (ref 150–400)
RBC: 3.72 MIL/uL — ABNORMAL LOW (ref 3.87–5.11)
RDW: 12.7 % (ref 11.5–15.5)
WBC: 8.7 10*3/uL (ref 4.0–10.5)
nRBC: 0 % (ref 0.0–0.2)

## 2021-03-09 MED ORDER — ORAL CARE MOUTH RINSE: 15.0000 mL | OROMUCOSAL | Status: DC

## 2021-03-09 MED ORDER — OMEGA-3-ACID ETHYL ESTERS 1 G PO CAPS
1.0000 g | ORAL_CAPSULE | Freq: Two times a day (BID) | ORAL | Status: DC
  Filled 2021-03-09: qty 1

## 2021-03-09 MED ORDER — CHLORHEXIDINE GLUCONATE 0.12% ORAL RINSE (MEDLINE KIT): 15.0000 mL | Freq: Two times a day (BID) | OROMUCOSAL | Status: DC

## 2021-03-09 MED ORDER — ENOXAPARIN SODIUM 30 MG/0.3ML IJ SOSY
30.0000 mg | PREFILLED_SYRINGE | INTRAMUSCULAR | Status: DC
  Administered 2021-03-09 – 2021-03-12 (×4): 30 mg via SUBCUTANEOUS
  Filled 2021-03-09 (×4): qty 0.3

## 2021-03-09 MED ORDER — OMEGA-3-ACID ETHYL ESTERS 1 G PO CAPS
2.0000 g | ORAL_CAPSULE | Freq: Two times a day (BID) | ORAL | Status: DC
  Administered 2021-03-09 – 2021-03-13 (×9): 2 g via ORAL
  Filled 2021-03-09 (×10): qty 2

## 2021-03-09 MED ORDER — ENOXAPARIN SODIUM 40 MG/0.4ML IJ SOSY: 40.0000 mg | PREFILLED_SYRINGE | INTRAMUSCULAR | Status: DC

## 2021-03-09 MED ORDER — FENOFIBRATE 160 MG PO TABS: 160.0000 mg | ORAL_TABLET | Freq: Every day | ORAL | Status: DC

## 2021-03-09 NOTE — Progress Notes (Signed)
Referring Physician(s): Code stroke  Supervising Physician: Corrie Mckusick  Patient Status:  [name] - In-pt  Chief Complaint: Follow up proximal left M2 thrombectomy 03/07/21 in NIR  Subjective:  Patient sitting up in chair, husband and RN at bedside. Patient able to follow commands but difficulty with word finding/expressing thoughts. Per husband this was not present prior to stroke. She denies complaints today.  Allergies: Statins, Zetia [ezetimibe], and Zocor [simvastatin]  Medications: Prior to Admission medications   Medication Sig Start Date End Date Taking? Authorizing Provider  Vitamin D, Ergocalciferol, (DRISDOL) 1.25 MG (50000 UNIT) CAPS capsule Take 50,000 Units by mouth once a week. 04/28/20  Yes [provider]  Accu-Chek Softclix Lancets lancets Check BS BID and PRN 07/16/19   Baruch Gouty, FNP  acetaminophen (TYLENOL) 500 MG tablet Take 1,000 mg by mouth daily as needed for moderate pain or headache.    [provider]  Alpha-Lipoic Acid 600 MG CAPS Take 2 capsules by mouth daily.    [provider]  ALPRAZolam Duanne Moron) 0.25 MG tablet Take 1 tablet (0.25 mg total) by mouth 2 (two) times daily as needed for anxiety. Patient taking differently: Take 0.25 mg by mouth at bedtime as needed (sleep.). 07/16/19   Rakes, Connye Burkitt, FNP  ALPRAZolam Duanne Moron) 0.25 MG tablet 1 tablet as needed 08/11/19   [provider]  amLODipine (NORVASC) 10 MG tablet Take 10 mg by mouth daily. 12/09/20   [provider]  amLODipine (NORVASC) 5 MG tablet Take 1 tablet (5 mg total) by mouth daily. 10/31/19   Loman Brooklyn, FNP  atenolol (TENORMIN) 50 MG tablet Take 1 tablet (50 mg total) by mouth daily. 05/23/19   Baruch Gouty, FNP  b complex vitamins tablet Take 1 tablet by mouth daily.    [provider]  Biotin 2500 MCG CAPS Take by mouth daily.    [provider]  cephALEXin (KEFLEX) 500 MG capsule Take 1 capsule (500 mg total) by  mouth 4 (four) times daily. 03/02/21   Milton Ferguson, MD  Cholecalciferol (VITAMIN D-3) 25 MCG (1000 UT) CAPS 2 capsule    [provider]  Cyanocobalamin (VITAMIN B12) 1000 MCG TBCR 1 tablet    [provider]  DULoxetine (CYMBALTA) 30 MG capsule Take 1 capsule (30 mg total) by mouth daily. Patient taking differently: Take 30 mg by mouth at bedtime. 05/23/19   Baruch Gouty, FNP  Evolocumab (REPATHA) 140 MG/ML SOSY 1 ml Patient not taking: Reported on 03/02/2021 11/04/20   [provider]  famotidine (PEPCID) 20 MG tablet Take 1 tablet (20 mg total) by mouth at bedtime. Patient taking differently: Take 20 mg by mouth daily. 08/16/19   Rehman, Mechele Dawley, MD  glipiZIDE (GLUCOTROL) 5 MG tablet TAKE 1/2 TABLET BY MOUTH  DAILY ONLY IF HAVING A  LARGE MEAL AT [facility] 04/22/20   Philemon Kingdom, MD  glucose blood (ACCU-CHEK AVIVA PLUS) test strip Test BS 2 times daily Dx E11.21 05/14/19   Baruch Gouty, FNP  hydrocortisone (ANUSOL-HC) 2.5 % rectal cream Place 1 application rectally 2 (two) times daily. 08/05/20   Rehman, Mechele Dawley, MD  ipratropium (ATROVENT) 0.06 % nasal spray 2 sprays into each nostril Three (3) times a day. 10/25/19   [provider]  irbesartan (AVAPRO) 150 MG tablet TAKE 1 TABLET BY MOUTH  DAILY Patient taking differently: Take 300 mg by mouth daily. 10/30/19   Claretta Fraise, MD  irbesartan (AVAPRO) 150 MG tablet 1  tablet 08/29/19   [provider]  levothyroxine (SYNTHROID) 125 MCG tablet Take 1 tablet (125 mcg total) by mouth daily before breakfast. 01/16/21   Philemon Kingdom, MD  levothyroxine (SYNTHROID) 137 MCG tablet Take 137 mcg by mouth daily before breakfast. Patient not taking: Reported on 03/02/2021 09/18/19   [provider]  pantoprazole (PROTONIX) 40 MG tablet Take 1 tablet (40 mg total) by mouth daily before lunch. 08/05/20   Rogene Houston, MD  Probiotic Product (PROBIOTIC DAILY PO) Take by mouth daily.    [provider]  psyllium (METAMUCIL SMOOTH TEXTURE) 58.6 % powder Take 1 packet by mouth daily. 10/11/18   Rehman, Mechele Dawley, MD  rosuvastatin (CRESTOR) 5 MG tablet TAKE 1 TABLET BY MOUTH  DAILY Patient taking differently: Take 5 mg by mouth at bedtime. 05/23/19   Baruch Gouty, FNP  sodium zirconium cyclosilicate (LOKELMA) 10 g PACK packet See admin instructions. 02/25/21 02/25/22  [provider]  calcium citrate-vitamin D 200-200 MG-UNIT TABS Take 1 tablet by mouth daily.    08/10/11  [provider]     Vital Signs: BP (!) 148/66   Pulse 75   Temp 97.8 F (36.6 C) (Oral)   Resp (!) 23   Ht 5\' 3"  (1.6 m)   Wt 156 lb 1.4 oz (70.8 kg)   SpO2 96%   BMI 27.65 kg/m   Physical Exam Vitals and nursing note reviewed.  HENT:     Head: Normocephalic.  Cardiovascular:     Rate and Rhythm: Normal rate.     Comments: (+) Right CFA puncture site clean, dry, dressed appropriately without active bleeding or drainage. Non tender, soft, non pulsatile.  Pulmonary:     Effort: Pulmonary effort is normal.  Skin:    General: Skin is warm and dry.  Neurological:     Mental Status: She is alert.  Alert, awake Expressive/receptive aphasia (mild) PER EOMs without obvious nystagmus or subjective diplopia. Visual fields grossly No facial asymmetry. Tongue midline Motor power full in all 4 extremities    Imaging: CT HEAD WO CONTRAST (5MM)  Result Date: 03/07/2021 CLINICAL DATA:  Follow-up intracranial hemorrhage EXAM: CT HEAD WITHOUT CONTRAST TECHNIQUE: Contiguous axial images were obtained from the base of the skull through the vertex without intravenous contrast. COMPARISON:  CT head obtained 1 day prior, intra procedural CT obtained earlier the same morning FINDINGS: Brain: There is hyperdensity overlying the left parietal and occipital lobes not seen on the prior study. This hyperdensity is not within the infarct territory seen on the prior CT perfusion. There is also  swelling of the gyri in the frontal lobe with ill-defined overlying hyperdensity. Mild asymmetric hyperdensity of the left basal ganglia may reflect contrast staining. There is no acute extra-axial fluid collection. Hypodensity throughout the subcortical and periventricular white matter is not significantly changed, again likely reflecting chronic white matter microangiopathy and remote infarcts. The ventricles are stable in size. There is no mass lesion. There is no midline shift. Vascular: There is calcification of the bilateral cavernous ICAs. Skull: Normal. Negative for fracture or focal lesion. Sinuses/Orbits: No acute finding. Other: None. IMPRESSION: 1. Hyperdensity overlying the left frontal, parietal, and occipital lobes not in the location of the infarct seen on the prior CT perfusion raises concern for subarachnoid hemorrhage. The appearance is similar to the findings on the intra procedural CT. 2. Asymmetric hyperdensity in the left basal ganglia may reflect contrast staining. Critical Value/emergent results were called by telephone at the  time of interpretation on 03/07/2021 at 11:02 am to provider Dr Saunders Revel verbally acknowledged these results. Electronically Signed   By: Valetta Mole M.D.   On: 03/07/2021 11:03   MR ANGIO HEAD WO CONTRAST  Result Date: 03/08/2021 CLINICAL DATA:  Stroke follow-up EXAM: MRI HEAD WITHOUT CONTRAST MRA HEAD WITHOUT CONTRAST TECHNIQUE: Multiplanar, multi-echo pulse sequences of the brain and surrounding structures were acquired without intravenous contrast. Angiographic images of the Circle of Willis were acquired using MRA technique without intravenous contrast. COMPARISON:  Yesterday FINDINGS: MRI HEAD FINDINGS Brain: Extensive patchy cortical and white matter infarction in the left cerebral hemisphere, greatest in the superior division region centered at the frontal parietal junction. Few acute cortical infarcts in the right parietooccipital cortex. Minimal if any  petechial hemorrhage in the left parietal region. No hematoma, hydrocephalus, shift. Confluent chronic small vessel ischemia in the deep white matter and to a lesser extent in the pons. Chronic perforator infarcts in the right corona radiata. Vascular: See below Skull and upper cervical spine: Normal marrow signal Sinuses/Orbits: Bilateral cataract resection MRA HEAD FINDINGS Anterior circulation: The carotid arteries are atheromatous but diffusely patent. No branch occlusion, beading, or aneurysm. Severe left M2 branch stenosis with flow gap, also highlighted on prior CTA. Intracranial atherosclerosis is extensive and advanced. Right M2 branch is also highly stenotic. Posterior circulation: Vertebral and basilar arteries are diffusely patent. Diffuse atheromatous irregularity posterior cerebral arteries high-grade narrowing the right PCA bifurcation IMPRESSION: Brain MRI: 1. Patchy acute infarcts clustered in the superior division left MCA territory but also seen elsewhere in the left MCA territory and to a limited extent the right parietal cortex. 2. Background of advanced chronic small vessel disease. Intracranial MRA: Severe and generalized intracranial atherosclerosis with high-grade bilateral M2 segment narrowings. No progression from CTA yesterday. Electronically Signed   By: Jorje Guild M.D.   On: 03/08/2021 05:43   MR BRAIN WO CONTRAST  Result Date: 03/08/2021 CLINICAL DATA:  Stroke follow-up EXAM: MRI HEAD WITHOUT CONTRAST MRA HEAD WITHOUT CONTRAST TECHNIQUE: Multiplanar, multi-echo pulse sequences of the brain and surrounding structures were acquired without intravenous contrast. Angiographic images of the Circle of Willis were acquired using MRA technique without intravenous contrast. COMPARISON:  Yesterday FINDINGS: MRI HEAD FINDINGS Brain: Extensive patchy cortical and white matter infarction in the left cerebral hemisphere, greatest in the superior division region centered at the frontal  parietal junction. Few acute cortical infarcts in the right parietooccipital cortex. Minimal if any petechial hemorrhage in the left parietal region. No hematoma, hydrocephalus, shift. Confluent chronic small vessel ischemia in the deep white matter and to a lesser extent in the pons. Chronic perforator infarcts in the right corona radiata. Vascular: See below Skull and upper cervical spine: Normal marrow signal Sinuses/Orbits: Bilateral cataract resection MRA HEAD FINDINGS Anterior circulation: The carotid arteries are atheromatous but diffusely patent. No branch occlusion, beading, or aneurysm. Severe left M2 branch stenosis with flow gap, also highlighted on prior CTA. Intracranial atherosclerosis is extensive and advanced. Right M2 branch is also highly stenotic. Posterior circulation: Vertebral and basilar arteries are diffusely patent. Diffuse atheromatous irregularity posterior cerebral arteries high-grade narrowing the right PCA bifurcation IMPRESSION: Brain MRI: 1. Patchy acute infarcts clustered in the superior division left MCA territory but also seen elsewhere in the left MCA territory and to a limited extent the right parietal cortex. 2. Background of advanced chronic small vessel disease. Intracranial MRA: Severe and generalized intracranial atherosclerosis with high-grade bilateral M2 segment narrowings. No progression from CTA yesterday. Electronically  Signed   By: Jorje Guild M.D.   On: 03/08/2021 05:43   IR CT Head Ltd  Result Date: 03/07/2021 INDICATION: 87 year old female presents with acute stroke symptoms, corresponding to left M2 inferior division emergent large vessel occlusion. She presents for angiogram and mechanical thrombectomy EXAM: ULTRASOUND-GUIDED ACCESS RIGHT COMMON FEMORAL ARTERY CERVICAL AND CEREBRAL ANGIOGRAM MECHANICAL THROMBECTOMY LEFT MCA BRANCHES ANGIO-SEAL FOR HEMOSTASIS COMPARISON:  CT imaging of the same day MEDICATIONS: 650 mg OG.  Plavix Bolus of kangrelor, with  the drip stopped secondary to intracranial hemorrhage ANESTHESIA/SEDATION: The anesthesia team was present to provide general endotracheal tube anesthesia and for patient monitoring during the procedure. Intubation was performed in room 2/biplane room. Left radial arterial line was performed by the anesthesia team. Interventional neuro radiology nursing staff was also present. CONTRAST:  86 cc FLUOROSCOPY TIME:  Fluoroscopy Time: 26 minutes 54 seconds (1,889 mGy). COMPLICATIONS: SIR LEVEL B - Normal therapy, includes overnight admission for observation., Intracranial hemorrhage TECHNIQUE: Informed written consent was obtained from the patient's family after a thorough discussion of the procedural risks, benefits and alternatives. All questions were addressed. Maximal Sterile Barrier Technique was utilized including caps, mask, sterile gowns, sterile gloves, sterile drape, hand hygiene and skin antiseptic. A timeout was performed prior to the initiation of the procedure. FINDINGS: Initial Findings: Left common carotid artery:  Normal course caliber and contour. Left external carotid artery: Patent with antegrade flow. Left internal carotid artery: Tortuosity of the cervical segment. No significant atherosclerotic changes. Vertical and petrous segment patent with normal course caliber contour. Cavernous segment patent. Clinoid segment patent. Antegrade flow of the ophthalmic artery. Ophthalmic segment patent. Terminus patent. Left MCA: M1 segment patent. Occlusion of the M2 segment inferior division. Patency maintained of the superior division. Left ACA: A 1 segment patent. A 2 segment perfuses the right territory. Angiogram demonstrates diffuse intracranial atherosclerosis involving the imaged ACA and MCA territory. Completion Findings: Left MCA: After 2 passes of mechanical thrombectomy, there is restoration of flow through the occluded M2 segment. Irregularity at the proximal M2 segment, compatible with a native  atherosclerotic plaque. Slow flow within the distal cortical vessels of the affected dominant inferior division territory. TICI 2c: Near complete perfusion except for slow flow in a few distal cortical vessels/presence of small distal cortical emboli Flat panel CT demonstrates small volume subarachnoid hemorrhage within the frontal sulci, [place] 3c. There is also some staining of the cortex in the parietal region where there appears to be completed infarct in the territory at risk, parietal territory, compatible with He[place]leberg PH1. PROCEDURE: The anesthesia team was present to provide general endotracheal tube anesthesia and for patient monitoring during the procedure. Intubation was performed in negative pressure [place] in neuro IR holding. Interventional neuro radiology nursing staff was also present. Ultrasound survey of the right inguinal region was performed with images stored and sent to PACs. Ultrasound confirmed patency of the right common femoral artery. 11 blade scalpel was used to make a small incision. Blunt dissection was performed with US guidance. A micropuncture needle was used access the right common femoral artery under direct ultrasound visualization. With excellent arterial blood flow returned, an .018 micro wire was passed through the needle, observed to enter the abdominal aorta under fluoroscopy. The needle was removed, and a micropuncture sheath was placed over the wire. The inner dilator and wire were removed, and an 035 wire was advanced under fluoroscopy into the abdominal aorta. The sheath was removed and a 25cm 67F straight vascular sheath was placed.  The dilator was removed and the sheath was flushed. Sheath was attached to pressurized and heparinized saline bag for constant forward flow. A coaxial system was then advanced over the 035 wire. This included a 95cm 087 "Walrus" balloon guide with coaxial 125cm Berenstein diagnostic catheter. This was advanced to the proximal descending  thoracic aorta. Wire was then removed. Double flush of the catheter was performed. Catheter was then used to select the left common carotid artery. Angiogram was performed. Using roadmap technique, the catheter was advanced over a standard glide wire into left cervical ICA, with distal position achieved of the balloon guide. The diagnostic catheter and the wire were removed. Formal angiogram was performed. Road map function was used once the occluded vessel was identified. Copious back flush was performed and the balloon catheter was attached to heparinized and pressurized saline bag for forward flow. A second coaxial system was then advanced through the balloon catheter, which included the selected intermediate catheter, microcatheter, and microwire. In this scenario, the set up included a 132cm CAT-5 intermediate catheter, a Trevo Provue18 microcatheter, and 014 synchro soft wire. This system was advanced through the balloon guide catheter under the road-map function, with adequate back-flush at the rotating hemostatic valve at that back end of the balloon guide. Microcatheter and the intermediate catheter system were advanced through the terminal ICA and MCA to the level of the occlusion. The micro wire was then carefully advanced through the occluded segment. Microcatheter was then manipulated through the occluded segment and the wire was removed with saline drip at the hub. Blood was then aspirated through the hub of the microcatheter, and a gentle contrast injection was performed confirming intraluminal position. A rotating hemostatic valve was then attached to the back end of the microcatheter, and a pressurized and heparinized saline bag was attached to the catheter. 4 x 40 solitaire device was then selected. Back flush was achieved at the rotating hemostatic valve, and then the device was gently advanced through the microcatheter to the distal end. The retriever was then unsheathed by withdrawing the  microcatheter under fluoroscopy. Once the retriever was completely unsheathed, the microcatheter was carefully stripped from the delivery device. Control angiogram was performed from the intermediate catheter. A 3 minute time interval was observed. The balloon at the balloon guide catheter was then inflated under fluoroscopy for proximal flow arrest. Constant aspiration using the proprietary engine was then performed at the intermediate catheter, as the retriever was gently and slowly withdrawn with fluoroscopic observation. Once the retriever was "corked" within the tip of the intermediate catheter, both were removed from the system. Free aspiration was confirmed at the hub of the balloon guide catheter, with free blood return confirmed. The balloon was then deflated, and a control angiogram was performed. Restoration of flow was confirmed, with irregularity at the site of the occlusion of the proximal M2 segment, with slow flow within the distal cortical vessels. We elected to observe a 5 minute interval time frame for reimaging, as there was a suspected native intracranial atherosclerotic plaque at the site and we anticipated a rethrombosis after the initial thrombectomy attempt. After 5 minutes angiogram was performed confirming re-occlusion. Anticipating the need for a rescue stent, an orogastric tube was placed, 650 mg of aspirin was administered, and we initiated an IV P2 y12 inhibitor bolus. In order to attempt restoration of flow through the segment, measured the target artery, as well as identify the best site for stenting, we elected to perform an additional stent retriever thrombectomy.  The microcatheter was passed with the synchro soft wire through the occluded segment into the parietal branches of the inferior division. The micro wire was carefully advanced through the occluded segment. Microcatheter was then manipulated through the occluded segment and the wire was removed with saline drip at the hub.  Gentle injection confirmed intraluminal location within the selected branches. 4 x 40 solitaire device was again selected. Back flush was achieved at the rotating hemostatic valve, and then the device was gently advanced through the microcatheter to the distal end. The retriever was then unsheathed by withdrawing the microcatheter under fluoroscopy. Once the retriever was completely unsheathed, angiogram was performed confirming flow through the segment. While we initiated a measurement of the segment, we observed a 3 minute time interval. Upon withdrawal of the stent tree verb ir, significant tension was observed at the interface/interaction of the stent tree verb ir with the artery, such that there was concern that some troubleshooting might be required in order to remove the stent retriever. A sudden release was observed and the stent retriever was removed from the balloon guide. Repeat angiogram demonstrated restoration of flow, with complete restoration of flow within the territory and slow distal flow within the cortical vessels compatible with TICI 2c: Near complete perfusion except for slow flow in a few distal cortical vessels/presence of small distal cortical emboli. As there was some concern for endothelial injury and narrowing at the site of the atherosclerotic plaque, we elected at this time not to perform a balloon angioplasty or stenting, electing for treatment only with anti-platelet. Balloon guide was withdrawn. Angiogram of the cervical ICA was performed. Balloon guide was then removed. The skin at the puncture site was then cleaned with Chlorhexidine. The 8 French sheath was removed and an 64F angioseal was deployed. Flat panel CT was performed. Patient tolerated the procedure well and remained hemodynamically stable throughout. No complications were encountered and no significant blood loss encountered. IMPRESSION: Status post ultrasound guided access right common femoral artery for left-sided  cervical/cerebral angiogram, mechanical thrombectomy of a presumed in-situ thrombosis of focal atheroscerlotic plaque at the proximal left M2, achieving TICI 2c flow. Angiogram demonstrates diffuse ICAD of the imaged ACA and MCA territory Angio-Seal for hemostasis. Signed, Dulcy Fanny. Dellia Nims, RPVI [facility] Radiology PLAN: The patient will remain intubated, given small volume intraparenchymal hemorrhage ICU status Target systolic blood pressure of 120-140 Right hip straight time 6 hours Frequent neurovascular checks Repeat neurologic imaging with CT and/MRI at the discretion of neurology team Electronically Signed   By: Corrie Mckusick D.O.   On: 03/07/2021 04:23   IR US Guide Vasc Access Right  Result Date: 03/07/2021 INDICATION: 87 year old female presents with acute stroke symptoms, corresponding to left M2 inferior division emergent large vessel occlusion. She presents for angiogram and mechanical thrombectomy EXAM: ULTRASOUND-GUIDED ACCESS RIGHT COMMON FEMORAL ARTERY CERVICAL AND CEREBRAL ANGIOGRAM MECHANICAL THROMBECTOMY LEFT MCA BRANCHES ANGIO-SEAL FOR HEMOSTASIS COMPARISON:  CT imaging of the same day MEDICATIONS: 650 mg OG.  Plavix Bolus of kangrelor, with the drip stopped secondary to intracranial hemorrhage ANESTHESIA/SEDATION: The anesthesia team was present to provide general endotracheal tube anesthesia and for patient monitoring during the procedure. Intubation was performed in room 2/biplane room. Left radial arterial line was performed by the anesthesia team. Interventional neuro radiology nursing staff was also present. CONTRAST:  86 cc FLUOROSCOPY TIME:  Fluoroscopy Time: 26 minutes 54 seconds (1,889 mGy). COMPLICATIONS: SIR LEVEL B - Normal therapy, includes overnight admission for observation.,  Intracranial hemorrhage TECHNIQUE: Informed written consent was obtained from the patient's family after a thorough discussion of the procedural  risks, benefits and alternatives. All questions were addressed. Maximal Sterile Barrier Technique was utilized including caps, mask, sterile gowns, sterile gloves, sterile drape, hand hygiene and skin antiseptic. A timeout was performed prior to the initiation of the procedure. FINDINGS: Initial Findings: Left common carotid artery:  Normal course caliber and contour. Left external carotid artery: Patent with antegrade flow. Left internal carotid artery: Tortuosity of the cervical segment. No significant atherosclerotic changes. Vertical and petrous segment patent with normal course caliber contour. Cavernous segment patent. Clinoid segment patent. Antegrade flow of the ophthalmic artery. Ophthalmic segment patent. Terminus patent. Left MCA: M1 segment patent. Occlusion of the M2 segment inferior division. Patency maintained of the superior division. Left ACA: A 1 segment patent. A 2 segment perfuses the right territory. Angiogram demonstrates diffuse intracranial atherosclerosis involving the imaged ACA and MCA territory. Completion Findings: Left MCA: After 2 passes of mechanical thrombectomy, there is restoration of flow through the occluded M2 segment. Irregularity at the proximal M2 segment, compatible with a native atherosclerotic plaque. Slow flow within the distal cortical vessels of the affected dominant inferior division territory. TICI 2c: Near complete perfusion except for slow flow in a few distal cortical vessels/presence of small distal cortical emboli Flat panel CT demonstrates small volume subarachnoid hemorrhage within the frontal sulci, Heidelberg 3c. There is also some staining of the cortex in the parietal region where there appears to be completed infarct in the territory at risk, parietal territory, compatible with Heidleberg PH1. PROCEDURE: The anesthesia team was present to provide general endotracheal tube anesthesia and for patient monitoring during the procedure. Intubation was performed  in negative pressure [place] in neuro IR holding. Interventional neuro radiology nursing staff was also present. Ultrasound survey of the right inguinal region was performed with images stored and sent to PACs. Ultrasound confirmed patency of the right common femoral artery. 11 blade scalpel was used to make a small incision. Blunt dissection was performed with US guidance. A micropuncture needle was used access the right common femoral artery under direct ultrasound visualization. With excellent arterial blood flow returned, an .018 micro wire was passed through the needle, observed to enter the abdominal aorta under fluoroscopy. The needle was removed, and a micropuncture sheath was placed over the wire. The inner dilator and wire were removed, and an 035 wire was advanced under fluoroscopy into the abdominal aorta. The sheath was removed and a 25cm 27F straight vascular sheath was placed. The dilator was removed and the sheath was flushed. Sheath was attached to pressurized and heparinized saline bag for constant forward flow. A coaxial system was then advanced over the 035 wire. This included a 95cm 087 "Walrus" balloon guide with coaxial 125cm Berenstein diagnostic catheter. This was advanced to the proximal descending thoracic aorta. Wire was then removed. Double flush of the catheter was performed. Catheter was then used to select the left common carotid artery. Angiogram was performed. Using roadmap technique, the catheter was advanced over a standard glide wire into left cervical ICA, with distal position achieved of the balloon guide. The diagnostic catheter and the wire were removed. Formal angiogram was performed. Road map function was used once the occluded vessel was identified. Copious back flush was performed and the balloon catheter was attached to heparinized and pressurized saline bag for forward flow. A second coaxial system was then advanced through the balloon catheter, which included the selected  intermediate catheter, microcatheter, and microwire. In this scenario, the set up included a 132cm CAT-5 intermediate catheter, a Trevo Provue18 microcatheter, and 014 synchro soft wire. This system was advanced through the balloon guide catheter under the road-map function, with adequate back-flush at the rotating hemostatic valve at that back end of the balloon guide. Microcatheter and the intermediate catheter system were advanced through the terminal ICA and MCA to the level of the occlusion. The micro wire was then carefully advanced through the occluded segment. Microcatheter was then manipulated through the occluded segment and the wire was removed with saline drip at the hub. Blood was then aspirated through the hub of the microcatheter, and a gentle contrast injection was performed confirming intraluminal position. A rotating hemostatic valve was then attached to the back end of the microcatheter, and a pressurized and heparinized saline bag was attached to the catheter. 4 x 40 solitaire device was then selected. Back flush was achieved at the rotating hemostatic valve, and then the device was gently advanced through the microcatheter to the distal end. The retriever was then unsheathed by withdrawing the microcatheter under fluoroscopy. Once the retriever was completely unsheathed, the microcatheter was carefully stripped from the delivery device. Control angiogram was performed from the intermediate catheter. A 3 minute time interval was observed. The balloon at the balloon guide catheter was then inflated under fluoroscopy for proximal flow arrest. Constant aspiration using the proprietary engine was then performed at the intermediate catheter, as the retriever was gently and slowly withdrawn with fluoroscopic observation. Once the retriever was "corked" within the tip of the intermediate catheter, both were removed from the system. Free aspiration was confirmed at the hub of the balloon guide catheter,  with free blood return confirmed. The balloon was then deflated, and a control angiogram was performed. Restoration of flow was confirmed, with irregularity at the site of the occlusion of the proximal M2 segment, with slow flow within the distal cortical vessels. We elected to observe a 5 minute interval time frame for reimaging, as there was a suspected native intracranial atherosclerotic plaque at the site and we anticipated a rethrombosis after the initial thrombectomy attempt. After 5 minutes angiogram was performed confirming re-occlusion. Anticipating the need for a rescue stent, an orogastric tube was placed, 650 mg of aspirin was administered, and we initiated an IV P2 y12 inhibitor bolus. In order to attempt restoration of flow through the segment, measured the target artery, as well as identify the best site for stenting, we elected to perform an additional stent retriever thrombectomy. The microcatheter was passed with the synchro soft wire through the occluded segment into the parietal branches of the inferior division. The micro wire was carefully advanced through the occluded segment. Microcatheter was then manipulated through the occluded segment and the wire was removed with saline drip at the hub. Gentle injection confirmed intraluminal location within the selected branches. 4 x 40 solitaire device was again selected. Back flush was achieved at the rotating hemostatic valve, and then the device was gently advanced through the microcatheter to the distal end. The retriever was then unsheathed by withdrawing the microcatheter under fluoroscopy. Once the retriever was completely unsheathed, angiogram was performed confirming flow through the segment. While we initiated a measurement of the segment, we observed a 3 minute time interval. Upon withdrawal of the stent tree verb ir, significant tension was observed at the interface/interaction of the stent tree verb ir with the artery, such that there was  concern that some troubleshooting  might be required in order to remove the stent retriever. A sudden release was observed and the stent retriever was removed from the balloon guide. Repeat angiogram demonstrated restoration of flow, with complete restoration of flow within the territory and slow distal flow within the cortical vessels compatible with TICI 2c: Near complete perfusion except for slow flow in a few distal cortical vessels/presence of small distal cortical emboli. As there was some concern for endothelial injury and narrowing at the site of the atherosclerotic plaque, we elected at this time not to perform a balloon angioplasty or stenting, electing for treatment only with anti-platelet. Balloon guide was withdrawn. Angiogram of the cervical ICA was performed. Balloon guide was then removed. The skin at the puncture site was then cleaned with Chlorhexidine. The 8 French sheath was removed and an 51F angioseal was deployed. Flat panel CT was performed. Patient tolerated the procedure well and remained hemodynamically stable throughout. No complications were encountered and no significant blood loss encountered. IMPRESSION: Status post ultrasound guided access right common femoral artery for left-sided cervical/cerebral angiogram, mechanical thrombectomy of a presumed in-situ thrombosis of focal atheroscerlotic plaque at the proximal left M2, achieving TICI 2c flow. Angiogram demonstrates diffuse ICAD of the imaged ACA and MCA territory Angio-Seal for hemostasis. Signed, Dulcy Fanny. Dellia Nims, RPVI [facility] Radiology PLAN: The patient will remain intubated, given small volume intraparenchymal hemorrhage ICU status Target systolic blood pressure of 120-140 Right hip straight time 6 hours Frequent neurovascular checks Repeat neurologic imaging with CT and/MRI at the discretion of neurology team Electronically Signed   By: Corrie Mckusick D.O.   On: 03/07/2021  04:23   DG CHEST PORT 1 VIEW  Result Date: 03/08/2021 CLINICAL DATA:  Acute respiratory failure with hypoxia. EXAM: PORTABLE CHEST 1 VIEW COMPARISON:  Chest x-rays dated 03/07/2021 and 01/14/2017. FINDINGS: Endotracheal tube has been removed since yesterday's exam. Heart size an RIGHT lung is clear. Osseous structures about the chest are unremarkable. D mediastinal contours are grossly stable. Probable mild atelectasis and/or small pleural effusion at the LEFT lung base. No pneumothorax is seen. IMPRESSION: 1. Interval extubation. 2. Probable mild atelectasis and/or small pleural effusion at the LEFT lung base. 3. No evidence of pneumonia or pulmonary edema. Electronically Signed   By: Franki Cabot M.D.   On: 03/08/2021 06:51   DG CHEST PORT 1 VIEW  Result Date: 03/07/2021 CLINICAL DATA:  Check endotracheal tube placement EXAM: PORTABLE CHEST 1 VIEW COMPARISON:  09/14/2016 FINDINGS: Check shadow is within normal limits. Aortic calcifications are seen. Endotracheal tube is noted 2 cm above the carina. Lungs are well aerated bilaterally. Minimal left basilar atelectasis is seen. No bony abnormality is noted. IMPRESSION: Endotracheal tube in satisfactory position. Mild left basilar atelectasis. Electronically Signed   By: Inez Catalina M.D.   On: 03/07/2021 04:02   DG Abd Portable 1V  Result Date: 03/07/2021 CLINICAL DATA:  Orogastric tube placement EXAM: PORTABLE ABDOMEN - 1 VIEW COMPARISON:  01/02/2014 FINDINGS: Limited radiograph of the lower chest and upper abdomen was obtained for the purposes of enteric tube localization. Enteric tube is seen coursing below the diaphragm with distal tip and side port terminating within the expected location of the distal gastric body. Excreted contrast is noted within the renal collecting systems and urinary bladder. IMPRESSION: Enteric tube within the distal gastric body. Electronically Signed   By: Davina Poke D.O.   On: 03/07/2021 13:18   ECHOCARDIOGRAM  COMPLETE  Result Date: 03/08/2021  ECHOCARDIOGRAM REPORT   Patient Name:   Lisa Sutton Date of Exam: 03/08/2021 Medical Rec #:  [id number]         Height:       63.0 in Accession #:    [id number]        Weight:       156.1 lb Date of Birth:  1934-02-21         BSA:          1.740 m Patient Age:    87 years          BP:           132/69 mmHg Patient Gender: F                 HR:           87 bpm. Exam Location:  Inpatient Procedure: 2D Echo, Cardiac Doppler and Color Doppler Indications:    Stroke  History:        Patient has no prior history of Echocardiogram examinations.                 Risk Factors:Hypertension and Diabetes. GERD.  Sonographer:    Clayton Lefort RDCS (AE) Referring Phys: [id number] [place]  1. Left ventricular ejection fraction, by estimation, is 60 to 65%. The left ventricle has normal function. The left ventricle has no regional wall motion abnormalities. There is mild concentric left ventricular hypertrophy. Left ventricular diastolic parameters are indeterminate.  2. Right ventricular systolic function is normal. The right ventricular size is normal. There is normal pulmonary artery systolic pressure. The estimated right ventricular systolic pressure is 92.4 mmHg.  3. The mitral valve is degenerative. No evidence of mitral valve regurgitation. No evidence of mitral stenosis. Moderate to severe mitral annular calcification.  4. The aortic valve is grossly normal. Aortic valve regurgitation is not visualized. No aortic stenosis is present.  5. The inferior vena cava is normal in size with greater than 50% respiratory variability, suggesting right atrial pressure of 3 mmHg. Conclusion(s)/Recommendation(s): No intracardiac source of embolism detected on this transthoracic study. A transesophageal echocardiogram is recommended to exclude cardiac source of embolism if clinically indicated. FINDINGS  Left Ventricle: Left ventricular ejection fraction, by estimation, is 60 to  65%. The left ventricle has normal function. The left ventricle has no regional wall motion abnormalities. The left ventricular internal cavity size was normal in size. There is  mild concentric left ventricular hypertrophy. Left ventricular diastolic function could not be evaluated due to mitral annular calcification (moderate or greater). Left ventricular diastolic parameters are indeterminate. Right Ventricle: The right ventricular size is normal. No increase in right ventricular wall thickness. Right ventricular systolic function is normal. There is normal pulmonary artery systolic pressure. The tricuspid regurgitant velocity is 2.04 m/s, and  with an assumed right atrial pressure of 3 mmHg, the estimated right ventricular systolic pressure is 26.8 mmHg. Left Atrium: Left atrial size was normal in size. Right Atrium: Right atrial size was normal in size. Pericardium: Trivial pericardial effusion is present. Presence of pericardial fat pad. Mitral Valve: The mitral valve is degenerative in appearance. Moderate to severe mitral annular calcification. No evidence of mitral valve regurgitation. No evidence of mitral valve stenosis. MV peak gradient, 8.2 mmHg. The mean mitral valve gradient is 3.0 mmHg. Tricuspid Valve: The tricuspid valve is grossly normal. Tricuspid valve regurgitation is trivial. No evidence of tricuspid stenosis. Aortic Valve: The aortic valve is grossly normal. Aortic valve regurgitation is  not visualized. No aortic stenosis is present. Aortic valve mean gradient measures 6.0 mmHg. Aortic valve peak gradient measures 10.4 mmHg. Aortic valve area, by VTI measures  2.57 cm. Pulmonic Valve: The pulmonic valve was grossly normal. Pulmonic valve regurgitation is not visualized. No evidence of pulmonic stenosis. Aorta: The aortic root is normal in size and structure. Venous: The inferior vena cava is normal in size with greater than 50% respiratory variability, suggesting right atrial pressure of 3  mmHg. IAS/Shunts: The atrial septum is grossly normal.  LEFT VENTRICLE PLAX 2D LVIDd:         3.80 cm   Diastology LVIDs:         2.70 cm   LV e' medial:    5.85 cm/s LV PW:         1.20 cm   LV E/e' medial:  18.6 LV IVS:        1.20 cm   LV e' lateral:   10.10 cm/s LVOT diam:     2.00 cm   LV E/e' lateral: 10.8 LV SV:         80 LV SV Index:   46 LVOT Area:     3.14 cm  RIGHT VENTRICLE             IVC RV Basal diam:  3.20 cm     IVC diam: 2.10 cm RV S prime:     17.60 cm/s TAPSE (M-mode): 2.6 cm LEFT ATRIUM             Index        RIGHT ATRIUM           Index LA diam:        3.30 cm 1.90 cm/m   RA Area:     17.90 cm LA Vol (A2C):   48.3 ml 27.75 ml/m  RA Volume:   53.40 ml  30.68 ml/m LA Vol (A4C):   51.5 ml 29.59 ml/m LA Biplane Vol: 51.6 ml 29.65 ml/m  AORTIC VALVE AV Area (Vmax):    2.44 cm AV Area (Vmean):   2.23 cm AV Area (VTI):     2.57 cm AV Vmax:           161.00 cm/s AV Vmean:          121.000 cm/s AV VTI:            0.313 m AV Peak Grad:      10.4 mmHg AV Mean Grad:      6.0 mmHg LVOT Vmax:         125.00 cm/s LVOT Vmean:        86.000 cm/s LVOT VTI:          0.256 m LVOT/AV VTI ratio: 0.82  AORTA Ao Root diam: 3.50 cm MITRAL VALVE                TRICUSPID VALVE MV Area (PHT): 3.07 cm     TR Peak grad:   16.6 mmHg MV Area VTI:   2.77 cm     TR Vmax:        204.00 cm/s MV Peak grad:  8.2 mmHg MV Mean grad:  3.0 mmHg     SHUNTS MV Vmax:       1.43 m/s     Systemic VTI:  0.26 m MV Vmean:      85.4 cm/s    Systemic Diam: 2.00 cm MV Decel Time: 247 msec MV E velocity: 109.00 cm/s MV A  velocity: 138.00 cm/s MV E/A ratio:  0.79 Eleonore Chiquito MD Electronically signed by Eleonore Chiquito MD Signature Date/Time: 03/08/2021/2:36:59 PM    Final    IR PERCUTANEOUS ART THROMBECTOMY/INFUSION INTRACRANIAL INC DIAG ANGIO  Result Date: 03/07/2021 INDICATION: 87 year old female presents with acute stroke symptoms, corresponding to left M2 inferior division emergent large vessel occlusion. She presents for  angiogram and mechanical thrombectomy EXAM: ULTRASOUND-GUIDED ACCESS RIGHT COMMON FEMORAL ARTERY CERVICAL AND CEREBRAL ANGIOGRAM MECHANICAL THROMBECTOMY LEFT MCA BRANCHES ANGIO-SEAL FOR HEMOSTASIS COMPARISON:  CT imaging of the same day MEDICATIONS: 650 mg OG.  Plavix Bolus of kangrelor, with the drip stopped secondary to intracranial hemorrhage ANESTHESIA/SEDATION: The anesthesia team was present to provide general endotracheal tube anesthesia and for patient monitoring during the procedure. Intubation was performed in room 2/biplane room. Left radial arterial line was performed by the anesthesia team. Interventional neuro radiology nursing staff was also present. CONTRAST:  86 cc FLUOROSCOPY TIME:  Fluoroscopy Time: 26 minutes 54 seconds (1,889 mGy). COMPLICATIONS: SIR LEVEL B - Normal therapy, includes overnight admission for observation., Intracranial hemorrhage TECHNIQUE: Informed written consent was obtained from the patient's family after a thorough discussion of the procedural risks, benefits and alternatives. All questions were addressed. Maximal Sterile Barrier Technique was utilized including caps, mask, sterile gowns, sterile gloves, sterile drape, hand hygiene and skin antiseptic. A timeout was performed prior to the initiation of the procedure. FINDINGS: Initial Findings: Left common carotid artery:  Normal course caliber and contour. Left external carotid artery: Patent with antegrade flow. Left internal carotid artery: Tortuosity of the cervical segment. No significant atherosclerotic changes. Vertical and petrous segment patent with normal course caliber contour. Cavernous segment patent. Clinoid segment patent. Antegrade flow of the ophthalmic artery. Ophthalmic segment patent. Terminus patent. Left MCA: M1 segment patent. Occlusion of the M2 segment inferior division. Patency maintained of the superior division. Left ACA: A 1 segment patent. A 2 segment perfuses the right territory. Angiogram  demonstrates diffuse intracranial atherosclerosis involving the imaged ACA and MCA territory. Completion Findings: Left MCA: After 2 passes of mechanical thrombectomy, there is restoration of flow through the occluded M2 segment. Irregularity at the proximal M2 segment, compatible with a native atherosclerotic plaque. Slow flow within the distal cortical vessels of the affected dominant inferior division territory. TICI 2c: Near complete perfusion except for slow flow in a few distal cortical vessels/presence of small distal cortical emboli Flat panel CT demonstrates small volume subarachnoid hemorrhage within the frontal sulci, Heidelberg 3c. There is also some staining of the cortex in the parietal region where there appears to be completed infarct in the territory at risk, parietal territory, compatible with Heidleberg PH1. PROCEDURE: The anesthesia team was present to provide general endotracheal tube anesthesia and for patient monitoring during the procedure. Intubation was performed in negative pressure [place] in neuro IR holding. Interventional neuro radiology nursing staff was also present. Ultrasound survey of the right inguinal region was performed with images stored and sent to PACs. Ultrasound confirmed patency of the right common femoral artery. 11 blade scalpel was used to make a small incision. Blunt dissection was performed with US guidance. A micropuncture needle was used access the right common femoral artery under direct ultrasound visualization. With excellent arterial blood flow returned, an .018 micro wire was passed through the needle, observed to enter the abdominal aorta under fluoroscopy. The needle was removed, and a micropuncture sheath was placed over the wire. The inner dilator and wire were removed, and an 035 wire was advanced under fluoroscopy  into the abdominal aorta. The sheath was removed and a 25cm 22F straight vascular sheath was placed. The dilator was removed and the sheath was  flushed. Sheath was attached to pressurized and heparinized saline bag for constant forward flow. A coaxial system was then advanced over the 035 wire. This included a 95cm 087 "Walrus" balloon guide with coaxial 125cm Berenstein diagnostic catheter. This was advanced to the proximal descending thoracic aorta. Wire was then removed. Double flush of the catheter was performed. Catheter was then used to select the left common carotid artery. Angiogram was performed. Using roadmap technique, the catheter was advanced over a standard glide wire into left cervical ICA, with distal position achieved of the balloon guide. The diagnostic catheter and the wire were removed. Formal angiogram was performed. Road map function was used once the occluded vessel was identified. Copious back flush was performed and the balloon catheter was attached to heparinized and pressurized saline bag for forward flow. A second coaxial system was then advanced through the balloon catheter, which included the selected intermediate catheter, microcatheter, and microwire. In this scenario, the set up included a 132cm CAT-5 intermediate catheter, a Trevo Provue18 microcatheter, and 014 synchro soft wire. This system was advanced through the balloon guide catheter under the road-map function, with adequate back-flush at the rotating hemostatic valve at that back end of the balloon guide. Microcatheter and the intermediate catheter system were advanced through the terminal ICA and MCA to the level of the occlusion. The micro wire was then carefully advanced through the occluded segment. Microcatheter was then manipulated through the occluded segment and the wire was removed with saline drip at the hub. Blood was then aspirated through the hub of the microcatheter, and a gentle contrast injection was performed confirming intraluminal position. A rotating hemostatic valve was then attached to the back end of the microcatheter, and a pressurized and  heparinized saline bag was attached to the catheter. 4 x 40 solitaire device was then selected. Back flush was achieved at the rotating hemostatic valve, and then the device was gently advanced through the microcatheter to the distal end. The retriever was then unsheathed by withdrawing the microcatheter under fluoroscopy. Once the retriever was completely unsheathed, the microcatheter was carefully stripped from the delivery device. Control angiogram was performed from the intermediate catheter. A 3 minute time interval was observed. The balloon at the balloon guide catheter was then inflated under fluoroscopy for proximal flow arrest. Constant aspiration using the proprietary engine was then performed at the intermediate catheter, as the retriever was gently and slowly withdrawn with fluoroscopic observation. Once the retriever was "corked" within the tip of the intermediate catheter, both were removed from the system. Free aspiration was confirmed at the hub of the balloon guide catheter, with free blood return confirmed. The balloon was then deflated, and a control angiogram was performed. Restoration of flow was confirmed, with irregularity at the site of the occlusion of the proximal M2 segment, with slow flow within the distal cortical vessels. We elected to observe a 5 minute interval time frame for reimaging, as there was a suspected native intracranial atherosclerotic plaque at the site and we anticipated a rethrombosis after the initial thrombectomy attempt. After 5 minutes angiogram was performed confirming re-occlusion. Anticipating the need for a rescue stent, an orogastric tube was placed, 650 mg of aspirin was administered, and we initiated an IV P2 y12 inhibitor bolus. In order to attempt restoration of flow through the segment, measured the target artery, as  well as identify the best site for stenting, we elected to perform an additional stent retriever thrombectomy. The microcatheter was passed  with the synchro soft wire through the occluded segment into the parietal branches of the inferior division. The micro wire was carefully advanced through the occluded segment. Microcatheter was then manipulated through the occluded segment and the wire was removed with saline drip at the hub. Gentle injection confirmed intraluminal location within the selected branches. 4 x 40 solitaire device was again selected. Back flush was achieved at the rotating hemostatic valve, and then the device was gently advanced through the microcatheter to the distal end. The retriever was then unsheathed by withdrawing the microcatheter under fluoroscopy. Once the retriever was completely unsheathed, angiogram was performed confirming flow through the segment. While we initiated a measurement of the segment, we observed a 3 minute time interval. Upon withdrawal of the stent tree verb ir, significant tension was observed at the interface/interaction of the stent tree verb ir with the artery, such that there was concern that some troubleshooting might be required in order to remove the stent retriever. A sudden release was observed and the stent retriever was removed from the balloon guide. Repeat angiogram demonstrated restoration of flow, with complete restoration of flow within the territory and slow distal flow within the cortical vessels compatible with TICI 2c: Near complete perfusion except for slow flow in a few distal cortical vessels/presence of small distal cortical emboli. As there was some concern for endothelial injury and narrowing at the site of the atherosclerotic plaque, we elected at this time not to perform a balloon angioplasty or stenting, electing for treatment only with anti-platelet. Balloon guide was withdrawn. Angiogram of the cervical ICA was performed. Balloon guide was then removed. The skin at the puncture site was then cleaned with Chlorhexidine. The 8 French sheath was removed and an 33F angioseal was  deployed. Flat panel CT was performed. Patient tolerated the procedure well and remained hemodynamically stable throughout. No complications were encountered and no significant blood loss encountered. IMPRESSION: Status post ultrasound guided access right common femoral artery for left-sided cervical/cerebral angiogram, mechanical thrombectomy of a presumed in-situ thrombosis of focal atheroscerlotic plaque at the proximal left M2, achieving TICI 2c flow. Angiogram demonstrates diffuse ICAD of the imaged ACA and MCA territory Angio-Seal for hemostasis. Signed, Dulcy Fanny. Dellia Nims, RPVI [facility] Radiology PLAN: The patient will remain intubated, given small volume intraparenchymal hemorrhage ICU status Target systolic blood pressure of 120-140 Right hip straight time 6 hours Frequent neurovascular checks Repeat neurologic imaging with CT and/MRI at the discretion of neurology team Electronically Signed   By: Corrie Mckusick D.O.   On: 03/07/2021 04:23   CT HEAD CODE STROKE WO CONTRAST  Result Date: 03/06/2021 CLINICAL DATA:  Code stroke.  Encephalopathy EXAM: CT HEAD WITHOUT CONTRAST TECHNIQUE: Contiguous axial images were obtained from the base of the skull through the vertex without intravenous contrast. COMPARISON:  None. FINDINGS: Brain: There is no mass, hemorrhage or extra-axial collection. The size and configuration of the ventricles and extra-axial CSF spaces are normal. There is hypoattenuation of the periventricular white matter, most commonly indicating chronic ischemic microangiopathy. Multiple old small vessel infarcts. Vascular: Atherosclerotic calcification of the internal carotid arteries at the skull base. No abnormal hyperdensity of the major intracranial arteries or dural venous sinuses. Skull: The visualized skull base, calvarium and extracranial soft tissues are normal. Sinuses/Orbits: No fluid levels or advanced mucosal thickening of the  visualized  paranasal sinuses. No mastoid or middle ear effusion. The orbits are normal. ASPECTS [facility] Stroke Program Early CT Score) - Ganglionic level infarction (caudate, lentiform nuclei, internal capsule, insula, M1-M3 cortex): 7 - Supraganglionic infarction (M4-M6 cortex): 3 Total score (0-10 with 10 being normal): 10 IMPRESSION: 1. No acute intracranial abnormality. 2. ASPECTS is 10. 3. Chronic ischemic microangiopathy and multiple old small vessel infarcts. These results were called by telephone at the time of interpretation on 03/06/2021 at 10:36 pm to provider [facility] , who verbally acknowledged these results. Electronically Signed   By: Ulyses Jarred M.D.   On: 03/06/2021 22:36   VAS [place] LOWER EXTREMITY VENOUS (DVT)  Result Date: 03/08/2021  Lower Venous DVT Study Patient Name:  Lisa Sutton  Date of Exam:   03/08/2021 Medical Rec #: [id number]          Accession #:    [id number] Date of Birth: 05-12-1934          Patient Gender: F Patient Age:   79 years Exam Location:  [facility] Procedure:      VAS [place] LOWER EXTREMITY VENOUS (DVT) Referring Phys: Cornelius Moras XU --------------------------------------------------------------------------------  Indications: Stroke.  Comparison Study: No prior study on file Performing Technologist: Sharion Dove RVS  Examination Guidelines: A complete evaluation includes B-mode imaging, spectral Doppler, color Doppler, and power Doppler as needed of all accessible portions of each vessel. Bilateral testing is considered an integral part of a complete examination. Limited examinations for reoccurring indications may be performed as noted. The reflux portion of the exam is performed with the patient in reverse Trendelenburg.  +---------+---------------+---------+-----------+----------+--------------+ RIGHT    CompressibilityPhasicitySpontaneityPropertiesThrombus Aging +---------+---------------+---------+-----------+----------+--------------+ CFV       Full           Yes      Yes                                 +---------+---------------+---------+-----------+----------+--------------+ SFJ      Full                                                        +---------+---------------+---------+-----------+----------+--------------+ FV Prox  Full                                                        +---------+---------------+---------+-----------+----------+--------------+ FV Mid   Full                                                        +---------+---------------+---------+-----------+----------+--------------+ FV DistalFull                                                        +---------+---------------+---------+-----------+----------+--------------+ PFV      Full                                                        +---------+---------------+---------+-----------+----------+--------------+  POP      Full           Yes      Yes                                 +---------+---------------+---------+-----------+----------+--------------+ PTV      Full                                                        +---------+---------------+---------+-----------+----------+--------------+ PERO     Full                                                        +---------+---------------+---------+-----------+----------+--------------+   +---------+---------------+---------+-----------+----------+--------------+ LEFT     CompressibilityPhasicitySpontaneityPropertiesThrombus Aging +---------+---------------+---------+-----------+----------+--------------+ CFV      Full           Yes      Yes                                 +---------+---------------+---------+-----------+----------+--------------+ SFJ      Full                                                        +---------+---------------+---------+-----------+----------+--------------+ FV Prox  Full                                                         +---------+---------------+---------+-----------+----------+--------------+ FV Mid   Full                                                        +---------+---------------+---------+-----------+----------+--------------+ FV DistalFull                                                        +---------+---------------+---------+-----------+----------+--------------+ PFV      Full                                                        +---------+---------------+---------+-----------+----------+--------------+ POP      Full           Yes      Yes                                 +---------+---------------+---------+-----------+----------+--------------+  PTV      Full                                                        +---------+---------------+---------+-----------+----------+--------------+ PERO     Full                                                        +---------+---------------+---------+-----------+----------+--------------+     Summary: BILATERAL: - No evidence of deep vein thrombosis seen in the lower extremities, bilaterally. -   *See table(s) above for measurements and observations. Electronically signed by Monica Martinez MD on 03/08/2021 at 12:39:46 PM.    Final    CT ANGIO HEAD NECK W WO CM W PERF (CODE STROKE)  Result Date: 03/06/2021 CLINICAL DATA:  Encephalopathy with difficulty speaking EXAM: CT ANGIOGRAPHY HEAD AND NECK CT PERFUSION BRAIN TECHNIQUE: Multidetector CT imaging of the head and neck was performed using the standard protocol during bolus administration of intravenous contrast. Multiplanar CT image reconstructions and MIPs were obtained to evaluate the vascular anatomy. Carotid stenosis measurements (when applicable) are obtained utilizing NASCET criteria, using the distal internal carotid diameter as the denominator. Multiphase CT imaging of the brain was performed following IV bolus contrast injection. Subsequent  parametric perfusion maps were calculated using RAPID software. CONTRAST:  187mL OMNIPAQUE IOHEXOL 350 MG/ML SOLN COMPARISON:  None. FINDINGS: CTA NECK FINDINGS SKELETON: There is no bony spinal canal stenosis. No lytic or blastic lesion. OTHER NECK: Normal pharynx, larynx and major salivary glands. No cervical lymphadenopathy. Unremarkable thyroid gland. UPPER CHEST: No pneumothorax or pleural effusion. No nodules or masses. AORTIC ARCH: There is calcific atherosclerosis of the aortic arch. There is no aneurysm, dissection or hemodynamically significant stenosis of the visualized portion of the aorta. Conventional 3 vessel aortic branching pattern. The visualized proximal subclavian arteries are widely patent. RIGHT CAROTID SYSTEM: Normal without aneurysm, dissection or stenosis. LEFT CAROTID SYSTEM: Normal without aneurysm, dissection or stenosis. VERTEBRAL ARTERIES: Left dominant configuration. Narrowing of the left vertebral artery origin. There is no dissection, occlusion or flow-limiting stenosis to the skull base (V1-V3 segments). CTA HEAD FINDINGS POSTERIOR CIRCULATION: --Vertebral arteries: Normal V4 segments. --Inferior cerebellar arteries: Normal. --Basilar artery: Normal. --Superior cerebellar arteries: Normal. --Posterior cerebral arteries (PCA): Normal. ANTERIOR CIRCULATION: --Intracranial internal carotid arteries: Atherosclerotic calcification of the internal carotid arteries at the skull base without hemodynamically significant stenosis. --Anterior cerebral arteries (ACA): Normal. Both A1 segments are present. Patent anterior communicating artery (a-comm). --Middle cerebral arteries (MCA): There is a proximal left MCA M2 segment occlusion (series 7 images 80 6-88). Otherwise, the middle cerebral arteries are normal. VENOUS SINUSES: As permitted by contrast timing, patent. ANATOMIC VARIANTS: None Review of the MIP images confirms the above findings. CT Brain Perfusion Findings: ASPECTS: 10 CBF (<30%)  Volume: 70mL Perfusion (Tmax>6.0s) volume: 48mL Mismatch Volume: 58mL Infarction Location:Left MCA territory IMPRESSION: Proximal left MCA M2 segment occlusion with 4 mL left MCA territory core infarct and 54 mL ischemic penumbra. Critical Value/emergent results were called by telephone at the time of interpretation on 03/06/2021 at 11:35 pm to provider [facility] , who verbally acknowledged these results. Electronically Signed   By: Lennette Bihari  [place] M.D.   On: 03/06/2021 23:35    Labs:  CBC: Recent Labs    03/02/21 1903 03/06/21 2231 03/07/21 0412 03/08/21 0426 03/09/21 0310  WBC 10.1 9.2  --  11.5* 8.7  HGB 13.5 13.4 11.9* 12.6 11.2*  HCT 39.2 39.7 35.0* 36.3 33.7*  PLT 244 275  --  217 217    COAGS: Recent Labs    03/06/21 2231  INR 0.9  APTT 33    BMP: Recent Labs    03/02/21 1903 03/06/21 2231 03/07/21 0412 03/08/21 0426 03/09/21 0310  NA 132* 137 138 139 137  K 4.1 4.3 3.9 3.9 3.7  CL 102 107  --  112* 112*  CO2 22 22  --  17* 19*  GLUCOSE 156* 118*  --  128* 125*  BUN 33* 26*  --  19 17  CALCIUM 8.5* 8.2*  --  7.6* 7.8*  CREATININE 1.67* 1.97*  --  1.78* 1.71*  GFRNONAA 30* 24*  --  28* 29*    LIVER FUNCTION TESTS: Recent Labs    03/02/21 1903 03/06/21 2231  BILITOT 0.6 0.3  AST 16 19  ALT 13 13  ALKPHOS 67 66  PROT 7.1 6.9  ALBUMIN 3.8 3.7    Assessment and Plan:  87 y/o F who [facility] 10[facility] as a code stroke s/p proximal left M2 thrombectomy in NIR with Dr. Earleen Newport seen today for routine follow up.  Patient noted to have significant expressive aphasia on exam, follows commands without difficulty, moves all 4 extremities easily. Per RN patient to be transferred out of ICU once bed available.  Further plans per neurology/primary team. No NIR needs at this time however NIR remains available as needed, please call with questions or concerns.  Electronically Signed: Joaquim Nam, PA-C 03/09/2021, 3:26 PM   I spent a total of 15  Minutes at the the patient's bedside AND on the patient's hospital floor or unit, greater than 50% of which was counseling/coordinating care for code stroke follow up.

## 2021-03-09 NOTE — Progress Notes (Signed)
Physical Therapy Treatment Patient Details Name: Lisa Sutton MRN: [id number] DOB: 25-Sep-1933 Today's Date: 03/09/2021   History of Present Illness 87 y.o. female presents to [facility] hospital 03/06/2021 with aphasia. CTH demonstrates L MCA M2 occlusion. Pt underwent emergent thrombectomy on 03/07/2021. PMH includes HTN, GERD, Hep B, HLD, Obesity, osteoporosis.    PT Comments    Pt pleasant and able to progress gait and mobility with mod assist for balance. Pt with ataxia RUE and RLE with functional mobility with decreased cognition and problem solving. PT with mod assist to eat with LUE and assist for pericare as pt required increased time and multimodal cueing to accomplish care. Granddaughter present during session and educated for right inattention and visiting toward right as well as progression of mobility. CIR remains appropriate.  HR 93-120 BP 130/81    Recommendations for follow up therapy are one component of a multi-disciplinary discharge planning process, led by the attending physician.  Recommendations may be updated based on patient status, additional functional criteria and insurance authorization.  Follow Up Recommendations  CIR     Equipment Recommendations  Other (comment) (tBD with progression)    Recommendations for Other Services       Precautions / Restrictions Precautions Precautions: Fall Precaution Comments: expressive and receptive aphasia     Mobility  Bed Mobility Overal bed mobility: Needs Assistance Bed Mobility: Supine to Sit     Supine to sit: Min guard     General bed mobility comments: guarding for lines, increased time    Transfers Overall transfer level: Needs assistance   Transfers: Sit to/from Stand Sit to Stand: Min assist         General transfer comment: min assist to stand from bed and toilet with multimodal cues for hand placement and safety  Ambulation/Gait Ambulation/Gait assistance: Mod assist Gait Distance (Feet):  150 Feet Assistive device: 1 person hand held assist Gait Pattern/deviations: Step-through pattern;Decreased stride length   Gait velocity interpretation: 1.31 - 2.62 ft/sec, indicative of limited community ambulator General Gait Details: pt hooking left arm around therapist waist for gait and for short periods tolerating HHA with mod assist for balance and stability as pt with ataxic unstable gait needing directional cues and decreased attention to right   Stairs             Wheelchair Mobility    Modified Rankin (Stroke Patients Only) Modified Rankin (Stroke Patients Only) Pre-Morbid Rankin Score: No symptoms Modified Rankin: Moderately severe disability     Balance Overall balance assessment: Needs assistance Sitting-balance support: No upper extremity supported;Feet supported Sitting balance-Leahy Scale: Fair Sitting balance - Comments: EOB and toilet without physical assist   Standing balance support: Single extremity supported Standing balance-Leahy Scale: Poor Standing balance comment: mod assist with UE support                            Cognition Arousal/Alertness: Awake/alert Behavior During Therapy: WFL for tasks assessed/performed Overall Cognitive Status: Difficult to assess Area of Impairment: Attention;Memory;Following commands;Safety/judgement;Problem solving                 Orientation Level: Time Current Attention Level: Sustained Memory: Decreased short-term memory Following Commands: Follows one step commands inconsistently;Follows multi-step commands inconsistently Safety/Judgement: Decreased awareness of safety;Decreased awareness of deficits   Problem Solving: Difficulty sequencing;Requires verbal cues;Requires tactile cues General Comments: pt with right inattention and RUE ataxia as well as decreased awareness of deficits and safety with difficulty  fully determining cognition due to expressive/receptive aphasia       Exercises      General Comments        Pertinent Vitals/Pain Pain Assessment: No/denies pain    Home Living                      Prior Function            PT Goals (current goals can now be found in the care plan section) Progress towards PT goals: Progressing toward goals    Frequency    Min 4X/week      PT Plan Current plan remains appropriate    Co-evaluation              AM-PAC PT "6 Clicks" Mobility   Outcome Measure  Help needed turning from your back to your side while in a flat bed without using bedrails?: A Little Help needed moving from lying on your back to sitting on the side of a flat bed without using bedrails?: A Little Help needed moving to and from a bed to a chair (including a wheelchair)?: A Little Help needed standing up from a chair using your arms (e.g., wheelchair or bedside chair)?: A Little Help needed to walk in hospital room?: A Lot Help needed climbing 3-5 steps with a railing? : Total 6 Click Score: 15    End of Session Equipment Utilized During Treatment: Gait belt Activity Tolerance: Patient tolerated treatment well Patient left: in chair;with call bell/phone within reach;with chair alarm set;with family/visitor present Nurse Communication: Mobility status PT Visit Diagnosis: Other abnormalities of gait and mobility (R26.89);Unsteadiness on feet (R26.81);Muscle weakness (generalized) (M62.81);Other symptoms and signs involving the nervous system (R29.898)     Time: 8616-8372 PT Time Calculation (min) (ACUTE ONLY): 34 min  Charges:  $Gait Training: 8-22 mins $Therapeutic Activity: 8-22 mins                     Raeqwon Lux P, PT Acute Rehabilitation Services Pager: [phone] Office: [place] 03/09/2021, 10:18 AM

## 2021-03-09 NOTE — Evaluation (Signed)
Occupational Therapy Evaluation Patient Details Name: Lisa Sutton MRN: [id number] DOB: January 13, 1934 Today's Date: 03/09/2021   History of Present Illness 87 y.o. female presents to [facility] hospital 03/06/2021 with aphasia. CTH demonstrates L MCA M2 occlusion. Pt underwent emergent thrombectomy on 03/07/2021. PMH includes HTN, GERD, Hep B, HLD, Obesity, osteoporosis.   Clinical Impression   PTA patient independent. Admitted for above and presenting with problem list below, including impaired cognition, communication (aphasia), impaired sensation and coordination of R UE, and impaired vision (visual cut vs inattention).  Patient able to follow some simple commands, but requires multimodal cueing with decreased awareness to safety, deficits and poor problem solving.  She requires up to max assist for ADLs and min assist for transfers and in room mobility.  Educated pt and granddaughter on safety, functional use of R UE during meals and ADLs, and visual scanning towards R side. Believe she will benefit from continued OT services while admitted and after dc to optimize independence and safety with ADLs, mobility.  Will follow.      Recommendations for follow up therapy are one component of a multi-disciplinary discharge planning process, led by the attending physician.  Recommendations may be updated based on patient status, additional functional criteria and insurance authorization.   Follow Up Recommendations  CIR;Supervision/Assistance - 24 hour    Equipment Recommendations  Other (comment) (TBD)    Recommendations for Other Services Rehab consult     Precautions / Restrictions Precautions Precautions: Fall Precaution Comments: expressive and receptive aphasia Restrictions Weight Bearing Restrictions: No      Mobility Bed Mobility Overal bed mobility: Needs Assistance Bed Mobility: Supine to Sit     Supine to sit: Min guard     General bed mobility comments: OOB in recliner upon  entry    Transfers Overall transfer level: Needs assistance   Transfers: Sit to/from Stand Sit to Stand: Min assist         General transfer comment: for safety and balance    Balance Overall balance assessment: Needs assistance Sitting-balance support: No upper extremity supported;Feet supported Sitting balance-Leahy Scale: Fair Sitting balance - Comments: EOB and toilet without physical assist   Standing balance support: No upper extremity supported;During functional activity Standing balance-Leahy Scale: Poor Standing balance comment: relies on UE and external support                           ADL either performed or assessed with clinical judgement   ADL Overall ADL's : Needs assistance/impaired     Grooming: Minimal assistance;Standing Grooming Details (indicate cue type and reason): assist to manipulate items, locate items on R side and dropping items when in R hand Upper Body Bathing: Minimal assistance;Standing   Lower Body Bathing: Moderate assistance;Sit to/from stand   Upper Body Dressing : Moderate assistance;Sitting   Lower Body Dressing: Maximal assistance;Sit to/from stand Lower Body Dressing Details (indicate cue type and reason): difficulty manipulating with R hand and requires assist to don socks Toilet Transfer: Minimal assistance;Ambulation   Toileting- Clothing Manipulation and Hygiene: Maximal assistance;Sit to/from stand Toileting - Clothing Manipulation Details (indicate cue type and reason): pt attempted to wipe with R UE, requires max assist as wiping over clothes and difficutly problem sovling through task     Functional mobility during ADLs: Minimal assistance;Cueing for sequencing;Cueing for safety General ADL Comments: pt remains limited by R UE functional use, vision/inattention, cogntioin and balance     Vision Baseline Vision/History: 1 Wears  glasses Ability to See in Adequate Light: 0 Adequate (reading) Patient Visual  Report: Other (comment) (difficult to assess) Vision Assessment?: Yes Eye Alignment: Within Functional Limits Ocular Range of Motion: Within Functional Limits Alignment/Gaze Preference: Within Defined Limits Tracking/Visual Pursuits: Requires cues, head turns, or add eye shifts to track Additional Comments: difficult to assess due to cogntion, but noted increased difficulty locating items at sink on R side, bumping into items functionally on R side, head turns to locate "number of fingers" and on R side with periphreal vision testing locating pen when directly infront of R eye.  Question visual cut vs inattetion. Further assessment required.     Perception     Praxis      Pertinent Vitals/Pain Pain Assessment: No/denies pain     Hand Dominance Right   Extremity/Trunk Assessment Upper Extremity Assessment Upper Extremity Assessment: RUE deficits/detail;LUE deficits/detail;Difficult to assess due to impaired cognition RUE Deficits / Details: grossly 4/5 functionally, decreased sensation and coordination pt dropping items during session LUE Deficits / Details: grossly 4/5 functionally   Lower Extremity Assessment Lower Extremity Assessment: Defer to PT evaluation   Cervical / Trunk Assessment Cervical / Trunk Assessment: Normal   Communication Communication Communication: Receptive difficulties;Expressive difficulties   Cognition Arousal/Alertness: Awake/alert Behavior During Therapy: Impulsive Overall Cognitive Status: Difficult to assess Area of Impairment: Attention;Following commands;Awareness;Problem solving;Safety/judgement                 Orientation Level: Time Current Attention Level: Sustained Memory: Decreased short-term memory Following Commands: Follows one step commands inconsistently;Follows one step commands with increased time Safety/Judgement: Decreased awareness of safety;Decreased awareness of deficits Awareness: Emergent Problem Solving: Difficulty  sequencing;Requires verbal cues;Requires tactile cues General Comments: pt demonstrating difficulty following simple commands, verbalizes yes 90% of time to yes/no questions and requires cueing to sequence ADLs   General Comments  VSS on RA, granddaughter present and supportive    Exercises     Shoulder Instructions      Home Living Family/patient expects to be discharged to:: Private residence Living Arrangements: Children Available Help at Discharge: Family;Available 24 hours/day Type of Home: House Home Access: Stairs to enter [employer] of Steps: 4 Entrance Stairs-Rails: None Home Layout: Two level;Able to live on main level with bedroom/bathroom;Laundry or work area in Building surveyor of Steps: 4 Alternate Level Stairs-Rails: None Bathroom Shower/Tub: Medical sales representative: None          Prior Functioning/Environment Level of Independence: Independent        Comments: driving        OT Problem List: Decreased strength;Impaired balance (sitting and/or standing);Decreased activity tolerance;Impaired vision/perception;Decreased coordination;Decreased cognition;Decreased safety awareness;Decreased knowledge of use of DME or AE;Decreased knowledge of precautions;Impaired UE functional use;Impaired sensation      OT Treatment/Interventions: Self-care/ADL training;Neuromuscular education;DME and/or AE instruction;Therapeutic activities;Cognitive remediation/compensation;Visual/perceptual remediation/compensation;Patient/family education;Balance training    OT Goals(Current goals can be found in the care plan section) Acute Rehab OT Goals Patient Stated Goal: to return to independence OT Goal Formulation: With patient/family Time For Goal Achievement: 03/23/21 Potential to Achieve Goals: Good  OT Frequency: Min 3X/week   Barriers to D/C:            Co-evaluation               AM-PAC OT "6 Clicks" Daily Activity     Outcome Measure Help from another person eating meals?: A Little Help from another person taking care of  personal grooming?: A Little Help from another person toileting, which includes using toliet, bedpan, or urinal?: A Lot Help from another person bathing (including washing, rinsing, drying)?: A Lot Help from another person to put on and taking off regular upper body clothing?: A Lot Help from another person to put on and taking off regular lower body clothing?: A Lot 6 Click Score: 14   End of Session Equipment Utilized During Treatment: Gait belt Nurse Communication: Mobility status  Activity Tolerance: Patient tolerated treatment well Patient left: in chair;with call bell/phone within reach;with chair alarm set;with family/visitor present;with nursing/sitter in room  OT Visit Diagnosis: Other abnormalities of gait and mobility (R26.89);Muscle weakness (generalized) (M62.81);Cognitive communication deficit (R41.841);Other symptoms and signs involving cognitive function;Hemiplegia and hemiparesis Symptoms and signs involving cognitive functions: Cerebral infarction Hemiplegia - Right/Left: Right Hemiplegia - dominant/non-dominant: Dominant Hemiplegia - caused by: Cerebral infarction                Time: 1030-1058 OT Time Calculation (min): 28 min Charges:  OT General Charges $OT Visit: 1 Visit OT Evaluation $OT Eval Moderate Complexity: 1 Mod OT Treatments $Self Care/Home Management : 8-22 mins  Jolaine Artist, OT Acute Rehabilitation Services Pager [phone] Office [phone]   Delight Stare 03/09/2021, 12:18 PM

## 2021-03-09 NOTE — Progress Notes (Addendum)
Inpatient Rehabilitation Admissions Coordinator   Inpatient rehab consult received. I met with patient at bedside with her granddaughter Hildred Alamin. I discussed goal and expectations of a possible CIR admit. I have left a voicemail for her son, Heron Sabins, to contact me to further discuss her rehab venue options.  Danne Baxter, RN, MSN Rehab Admissions Coordinator [phone] 03/09/2021 12:57 PM  I spoke with son, Heron Sabins, by phone. He works during the day and there is no one who can provide supervision for patient when he works. Therefore SNF will need to be pursued until she is independent enough to be home alone for extended periods of time. He is in agreement. I have notified Acute team and TOC. We will sign off at this time.  Danne Baxter, RN, MSN Rehab Admissions Coordinator [phone] 03/09/2021 2:00 PM

## 2021-03-09 NOTE — Progress Notes (Addendum)
STROKE TEAM PROGRESS NOTE   ATTENDING NOTE: I reviewed above note and agree with the assessment and plan. Pt was seen and examined.   Patient sitting in chair, 2 sons and RN are at bedside.  Patient right arm mild pronator drift has been resolved.  However, patient still has aphasia, word salad and difficulty following commands.  Patient does have hard of hearing without hearing aid.  Asked son to bring hearing aid.  Discussed with Dr. Earleen Newport today and we both feel that her etiology of stroke likely due to atherosclerosis of left MCA.  Currently she is on aspirin 325 and Plavix 75 DAPT for 3 months and then aspirin alone.   Per chart, patient had statin intolerance, also not able to tolerate outpatient Repatha, has Zetia allergy, kidney function not a candidate for fenofibrate, currently on Lovaza.  She needs close outpatient follow-up with her PCP for better HLD management.  PT/OT recommend CIR.  Pending bed availability.  For detailed assessment and plan, please refer to above as I have made changes wherever appropriate.   Rosalin Hawking, MD PhD Stroke Neurology 03/09/2021 6:55 PM    INTERVAL HISTORY Today patient is sitting up in chair, alert, oriented x4. Greets me with "Good morning". Her speech is improving with ability to speak in short sentences and improved ability to comprehend today. She denies new concerns.  Grand daughter at bedside. Questions answered.   Vitals:   03/09/21 0200 03/09/21 0300 03/09/21 0400 03/09/21 0500  BP: 133/62 (!) 167/68 (!) 146/67 132/86  Pulse: 69 74 71 77  Resp: 14 12 15 13   Temp:      TempSrc:      SpO2: 92% 93% 93% 93%  Weight:      Height:       CBC:  Recent Labs  Lab 03/02/21 1903 03/06/21 2231 03/07/21 0412 03/08/21 0426 03/09/21 0310  WBC 10.1 9.2  --  11.5* 8.7  NEUTROABS 6.4 5.0  --   --   --   HGB 13.5 13.4   < > 12.6 11.2*  HCT 39.2 39.7   < > 36.3 33.7*  MCV 90.7 90.4  --  89.4 90.6  PLT 244 275  --  217 217   < > = values in  this interval not displayed.   Basic Metabolic Panel:  Recent Labs  Lab 03/08/21 0426 03/09/21 0310  NA 139 137  K 3.9 3.7  CL 112* 112*  CO2 17* 19*  GLUCOSE 128* 125*  BUN 19 17  CREATININE 1.78* 1.71*  CALCIUM 7.6* 7.8*   Lipid Panel:  HgbA1c:  Recent Labs  Lab 03/07/21 0624  HGBA1C 6.2*   Urine Drug Screen:  Recent Labs  Lab 03/06/21 2352  LABOPIA NONE DETECTED  COCAINSCRNUR NONE DETECTED  LABBENZ NONE DETECTED  AMPHETMU NONE DETECTED  THCU NONE DETECTED  LABBARB NONE DETECTED    Alcohol Level  Recent Labs  Lab 03/06/21 2231  ETH <10    IMAGING past 24 hours ECHOCARDIOGRAM COMPLETE  Result Date: 03/08/2021    ECHOCARDIOGRAM REPORT   Patient Name:   Lisa Sutton Date of Exam: 03/08/2021 Medical Rec #:  [id number]         Height:       63.0 in Accession #:    [id number]        Weight:       156.1 lb Date of Birth:  1934-01-03         BSA:  1.740 m Patient Age:    22 years          BP:           132/69 mmHg Patient Gender: F                 HR:           87 bpm. Exam Location:  Inpatient Procedure: 2D Echo, Cardiac Doppler and Color Doppler Indications:    Stroke  History:        Patient has no prior history of Echocardiogram examinations.                 Risk Factors:Hypertension and Diabetes. GERD.  Sonographer:    Clayton Lefort RDCS (AE) Referring Phys: [id number] [place]  1. Left ventricular ejection fraction, by estimation, is 60 to 65%. The left ventricle has normal function. The left ventricle has no regional wall motion abnormalities. There is mild concentric left ventricular hypertrophy. Left ventricular diastolic parameters are indeterminate.  2. Right ventricular systolic function is normal. The right ventricular size is normal. There is normal pulmonary artery systolic pressure. The estimated right ventricular systolic pressure is 28.7 mmHg.  3. The mitral valve is degenerative. No evidence of mitral valve regurgitation. No  evidence of mitral stenosis. Moderate to severe mitral annular calcification.  4. The aortic valve is grossly normal. Aortic valve regurgitation is not visualized. No aortic stenosis is present.  5. The inferior vena cava is normal in size with greater than 50% respiratory variability, suggesting right atrial pressure of 3 mmHg. Conclusion(s)/Recommendation(s): No intracardiac source of embolism detected on this transthoracic study. A transesophageal echocardiogram is recommended to exclude cardiac source of embolism if clinically indicated. FINDINGS  Left Ventricle: Left ventricular ejection fraction, by estimation, is 60 to 65%. The left ventricle has normal function. The left ventricle has no regional wall motion abnormalities. The left ventricular internal cavity size was normal in size. There is  mild concentric left ventricular hypertrophy. Left ventricular diastolic function could not be evaluated due to mitral annular calcification (moderate or greater). Left ventricular diastolic parameters are indeterminate. Right Ventricle: The right ventricular size is normal. No increase in right ventricular wall thickness. Right ventricular systolic function is normal. There is normal pulmonary artery systolic pressure. The tricuspid regurgitant velocity is 2.04 m/s, and  with an assumed right atrial pressure of 3 mmHg, the estimated right ventricular systolic pressure is 86.7 mmHg. Left Atrium: Left atrial size was normal in size. Right Atrium: Right atrial size was normal in size. Pericardium: Trivial pericardial effusion is present. Presence of pericardial fat pad. Mitral Valve: The mitral valve is degenerative in appearance. Moderate to severe mitral annular calcification. No evidence of mitral valve regurgitation. No evidence of mitral valve stenosis. MV peak gradient, 8.2 mmHg. The mean mitral valve gradient is 3.0 mmHg. Tricuspid Valve: The tricuspid valve is grossly normal. Tricuspid valve regurgitation is  trivial. No evidence of tricuspid stenosis. Aortic Valve: The aortic valve is grossly normal. Aortic valve regurgitation is not visualized. No aortic stenosis is present. Aortic valve mean gradient measures 6.0 mmHg. Aortic valve peak gradient measures 10.4 mmHg. Aortic valve area, by VTI measures  2.57 cm. Pulmonic Valve: The pulmonic valve was grossly normal. Pulmonic valve regurgitation is not visualized. No evidence of pulmonic stenosis. Aorta: The aortic root is normal in size and structure. Venous: The inferior vena cava is normal in size with greater than 50% respiratory variability, suggesting right atrial pressure of 3 mmHg.  IAS/Shunts: The atrial septum is grossly normal.  LEFT VENTRICLE PLAX 2D LVIDd:         3.80 cm   Diastology LVIDs:         2.70 cm   LV e' medial:    5.85 cm/s LV PW:         1.20 cm   LV E/e' medial:  18.6 LV IVS:        1.20 cm   LV e' lateral:   10.10 cm/s LVOT diam:     2.00 cm   LV E/e' lateral: 10.8 LV SV:         80 LV SV Index:   46 LVOT Area:     3.14 cm  RIGHT VENTRICLE             IVC RV Basal diam:  3.20 cm     IVC diam: 2.10 cm RV S prime:     17.60 cm/s TAPSE (M-mode): 2.6 cm LEFT ATRIUM             Index        RIGHT ATRIUM           Index LA diam:        3.30 cm 1.90 cm/m   RA Area:     17.90 cm LA Vol (A2C):   48.3 ml 27.75 ml/m  RA Volume:   53.40 ml  30.68 ml/m LA Vol (A4C):   51.5 ml 29.59 ml/m LA Biplane Vol: 51.6 ml 29.65 ml/m  AORTIC VALVE AV Area (Vmax):    2.44 cm AV Area (Vmean):   2.23 cm AV Area (VTI):     2.57 cm AV Vmax:           161.00 cm/s AV Vmean:          121.000 cm/s AV VTI:            0.313 m AV Peak Grad:      10.4 mmHg AV Mean Grad:      6.0 mmHg LVOT Vmax:         125.00 cm/s LVOT Vmean:        86.000 cm/s LVOT VTI:          0.256 m LVOT/AV VTI ratio: 0.82  AORTA Ao Root diam: 3.50 cm MITRAL VALVE                TRICUSPID VALVE MV Area (PHT): 3.07 cm     TR Peak grad:   16.6 mmHg MV Area VTI:   2.77 cm     TR Vmax:        204.00  cm/s MV Peak grad:  8.2 mmHg MV Mean grad:  3.0 mmHg     SHUNTS MV Vmax:       1.43 m/s     Systemic VTI:  0.26 m MV Vmean:      85.4 cm/s    Systemic Diam: 2.00 cm MV Decel Time: 247 msec MV E velocity: 109.00 cm/s MV A velocity: 138.00 cm/s MV E/A ratio:  0.79 Eleonore Chiquito MD Electronically signed by Eleonore Chiquito MD Signature Date/Time: 03/08/2021/2:36:59 PM    Final    VAS [place] LOWER EXTREMITY VENOUS (DVT)  Result Date: 03/08/2021  Lower Venous DVT Study Patient Name:  Lisa Sutton  Date of Exam:   03/08/2021 Medical Rec #: [id number]          Accession #:    [id number] Date of Birth: Dec 07, 1933  Patient Gender: F Patient Age:   80 years Exam Location:  [facility] Procedure:      VAS [place] LOWER EXTREMITY VENOUS (DVT) Referring Phys: Cornelius Moras Ethelyn Cerniglia --------------------------------------------------------------------------------  Indications: Stroke.  Comparison Study: No prior study on file Performing Technologist: Sharion Dove RVS  Examination Guidelines: A complete evaluation includes B-mode imaging, spectral Doppler, color Doppler, and power Doppler as needed of all accessible portions of each vessel. Bilateral testing is considered an integral part of a complete examination. Limited examinations for reoccurring indications may be performed as noted. The reflux portion of the exam is performed with the patient in reverse Trendelenburg.  +---------+---------------+---------+-----------+----------+--------------+ RIGHT    CompressibilityPhasicitySpontaneityPropertiesThrombus Aging +---------+---------------+---------+-----------+----------+--------------+ CFV      Full           Yes      Yes                                 +---------+---------------+---------+-----------+----------+--------------+ SFJ      Full                                                        +---------+---------------+---------+-----------+----------+--------------+ FV Prox  Full                                                         +---------+---------------+---------+-----------+----------+--------------+ FV Mid   Full                                                        +---------+---------------+---------+-----------+----------+--------------+ FV DistalFull                                                        +---------+---------------+---------+-----------+----------+--------------+ PFV      Full                                                        +---------+---------------+---------+-----------+----------+--------------+ POP      Full           Yes      Yes                                 +---------+---------------+---------+-----------+----------+--------------+ PTV      Full                                                        +---------+---------------+---------+-----------+----------+--------------+  PERO     Full                                                        +---------+---------------+---------+-----------+----------+--------------+   +---------+---------------+---------+-----------+----------+--------------+ LEFT     CompressibilityPhasicitySpontaneityPropertiesThrombus Aging +---------+---------------+---------+-----------+----------+--------------+ CFV      Full           Yes      Yes                                 +---------+---------------+---------+-----------+----------+--------------+ SFJ      Full                                                        +---------+---------------+---------+-----------+----------+--------------+ FV Prox  Full                                                        +---------+---------------+---------+-----------+----------+--------------+ FV Mid   Full                                                        +---------+---------------+---------+-----------+----------+--------------+ FV DistalFull                                                         +---------+---------------+---------+-----------+----------+--------------+ PFV      Full                                                        +---------+---------------+---------+-----------+----------+--------------+ POP      Full           Yes      Yes                                 +---------+---------------+---------+-----------+----------+--------------+ PTV      Full                                                        +---------+---------------+---------+-----------+----------+--------------+ PERO     Full                                                        +---------+---------------+---------+-----------+----------+--------------+  Summary: BILATERAL: - No evidence of deep vein thrombosis seen in the lower extremities, bilaterally. -   *See table(s) above for measurements and observations. Electronically signed by Monica Martinez MD on 03/08/2021 at 12:39:46 PM.    Final     PHYSICAL EXAM Vitals with BMI 03/09/2021 03/09/2021 03/09/2021  Height - - -  Weight - - -  BMI - - -  Systolic 403 474 259  Diastolic 86 67 68  Pulse 77 71 74     Temp:  [97.4 F (36.3 C)-98.2 F (36.8 C)] 97.4 F (36.3 C) (10/16 2300) Pulse Rate:  [65-92] 77 (10/17 0500) Resp:  [12-25] 13 (10/17 0500) BP: (95-167)/(56-116) 132/86 (10/17 0500) SpO2:  [90 %-98 %] 93 % (10/17 0500)  General - Well developed elderly female sitting up in bed in NAD.   Ophthalmologic - fundi not visualized due to noncooperation.  Cardiovascular - Regular rhythm and rate showing on cardiac monitor.  Mental Status -  Alert, able to state she is in the hospital and her name. Her expressive and receptive aphasia limits her ability to follow commands for exam. She is able to follow simple commands some of the time but mimics more effectively. Not able to follow complex commands.   Cranial Nerves II - XII - II - Visual field intact OU appears impaired on the right, but not able to follow  commands to full assess . III, IV, VI - Extraocular movements intact. V - Facial sensation testing impaired by aphasia VII - Facial movement intact bilaterally. VIII - Hearing grossly intact bilaterally. X - Palate elevates symmetrically. XI - Chin turning intact XII - Tongue protrusion assessment impaired by aphasia.  Motor Strength - The patient's strength was normal LUE and LLE but impaired proximally on the RUE with approximate 3/5 strength and RLE 3/5 but formal testing not possible with aphasia. She was able to lift RLE off bed but not able to maintain past count of 2 with drift back to bed.  Bulk was normal and fasciculations were absent.   Motor Tone - Muscle tone was assessed at the neck and appendages and was normal.  Sensory - Unable to assess due to aphasia  Coordination - Spontaneously moving with purpose and effectively on the left, some limited spontaneous shifting in bed movement on the right during exam.   Gait and Station - deferred.   ASSESSMENT/PLAN AYAANA BIONDO is a 87 y.o. female with PMH significant for HTN, GERD, Hep B, HLD, Obesity, osteoporosis who initially presented to [facility] ED with aphasia. Was evaluated by teleneurology, LKW of 1815, did not seem like she was eating right at 1830 and difficulty holding a fork. Baseline mRS is a 0. CTH with ASPECTS of 10, CTA with L MCA M2 occlusion with a 54cc penumbra and a 4cc core. She was outside thrombolytics window and was emergently transferred to [facility] for thrombectomy.   Stroke: Left M2 inferior division emergent large vessel occlusion s/p mechanical thrombectomy with TICI 2c result with Dr.Wagner 10/15 0200. Subsequent imaging by CT showing hyperdensity overlying the left frontal, parietal, and occipital lobes not in the location of the infarct seen on the prior CT perfusion raises concern for subarachnoid hemorrhage. Etiology cryptogenic.   Code Stroke CT head  No acute intracranial  abnormality. ASPECTS is 10. Chronic ischemic microangiopathy and multiple old small vessel infarcts. CTA head & neck  Proximal left MCA M2 segment occlusion with 4 mL left MCA territory core infarct and 54 mL  ischemic penumbra. Cerebral angio  s/p mechanical thrombectomy with TICI 2c result.  Post IR CT  Concern for [facility] with small volume Follow up HCT 10/15 PENDING MRI and MRA   1. Patchy acute infarcts clustered in the superior division left MCA territory but also seen elsewhere in the left MCA territory and to a limited extent the right parietal cortex. 2. Background of advanced chronic small vessel disease. Intracranial MRA: Severe and generalized intracranial atherosclerosis with high-grade bilateral M2 segment narrowings. No progression from CTA yesterday. CT head Repeat 10/15 1. Hyperdensity overlying the left frontal, parietal, and occipital lobes not in the location of the infarct seen on the prior CT perfusion raises concern for subarachnoid hemorrhage. The appearance is similar to the findings on the intra procedural CT. 2. Asymmetric hyperdensity in the left basal ganglia may reflect contrast staining.  2D Echo PENDING Bilat LE [place] PENDING LDL 205 HgbA1c 6.2 VTE prophylaxis - SCDs    Diet   DIET DYS 3 Room service appropriate? Yes with Assist; Fluid consistency: Thin  Not on anticoagulant or antiplatelet prior to admission Will started ASA 325 and plavix DAPT for 3 months and then ASA alone.  Obtain STAT Head CT for any neurologic decline  Therapy recommendations:   Disposition:          S/p intubation with mechanical ventilation Intubated pre-procedure and not initially extubated 2/2 concern for developing bleed Extubated 10/15 1635 Tolerating well  Hypertension Home meds:  atenolol, norvasc, irbesartan Unstable, requiring cardene infusion, will attempt to transition to oral medications now that she is able to take po. Some limited bradycardia in the 50s noted  early this am, monitor, may limit BB use. BP goal is 180/105  for now  Hyperlipidemia Home meds: Repatha listed but reported as not taking, family reports stopped after one week due leg aches. Chart review shows notes stating she is "statin intolerant". Now on home crestor 5 and will continue her repatha as outpt. LDL direct 147.9, goal < 70  Continue statin at discharge       DM2, controlled  No routine meds but glucotrol prn for "big meal" at home  HgbA1c 6.2, goal < 7.0 CBGs stable  Recent Labs    03/08/21 1939 03/08/21 2312 03/09/21 0752  GLUCAP 141* 134* 155*    SSI       Feeding/Nutrition Has passed swallow evaluation for dysphagia 3 diet Monitor intake        AKI Cre 1.46->1.67->1.97->1.78 Saline locked MIVF Now on diet, monitor intake      Other Stroke Risk Factors Advanced Age >/= 2  Overweight, Body mass index is 27.65 kg/m., BMI >/= 30 associated with increased stroke risk, recommend weight loss, diet and exercise as appropriate   Other Active Problems Anxiety:  Cymbalta daily, Xanax prn on home list (need to sort out her actual use when able) Hypothyroidism: continue home synthroid   Hospital day # 2 This plan of care was directed by Dr. Doristine Devoid, NP-C   To contact Stroke Continuity provider, please refer to [email or web address] After hours, contact General Neurology

## 2021-03-09 NOTE — Progress Notes (Signed)
NAME:  Lisa Sutton, MRN:  [id number], DOB:  [birth year], LOS: 2 ADMISSION DATE:  03/06/2021, CONSULTATION DATE:  03/08/21 REFERRING MD:  Lorrin Goodell, MD CHIEF COMPLAINT:  Acute stroke   History of Present Illness:  Lisa Sutton is a 87 y.o. F with PMH significant for HTN, Type 2 DM, hypothyroidism, GERD who was brought to [facility] with aphasia.  Was last seen well around 1815 when son left for the store.  When he returned pt was having trouble speaking and eating and notably confused.  Code stroke was initiated, was out of the window for TPA, initial head CT negative but CTA with L M2 segment occlusion.  She was transferred to [facility] and taken to IR for mechanical thrombectomy, with TICI 2c.  Repeat CT with small volume SAH in the frontal region,  likely early petechial hemorrhage vs contrast staining, therefore was left intubated and transferred to intensive care.     Past Medical History:  has a past medical history of Anxiety, Arthritis, Cataract, Dysphagia, Dysrhythmia, Essential hypertension, benign, Fatty liver, GERD (gastroesophageal reflux disease), H/O hypercalcemia (04/12/2017), Hepatitis B, Hepatitis B surface antigen positive, High cholesterol, History of hiatal hernia, Hypercalcemia, Hypothyroidism, Kidney stone (August 2014), Obesity, Osteoporosis, PONV (postoperative nausea and vomiting), and Type 2 diabetes mellitus ([place]).  Significant Hospital Events:  10/14 Presented to [facility] ED, code stroke, L M2 occlusion, transfer for thrombectomy. Intubated and not extubated in the setting of possible hemorrhage 10/15 Extubated   Consults:  PCCM  Procedures:  10/14 intubated 10/15 extubated  Significant Diagnostic Tests:  10/15 CT head- IMPRESSION: 1. Hyperdensity overlying the left frontal, parietal, and occipital lobes not in the location of the infarct seen on the prior CT perfusion raises concern for subarachnoid hemorrhage. The appearance is similar to the findings on the  intra procedural CT. 2. Asymmetric hyperdensity in the left basal ganglia may reflect contrast staining.  10/16 MRI brain- 1. Patchy acute infarcts clustered in the superior division left MCA territory but also seen elsewhere in the left MCA territory and to a limited extent the right parietal cortex. 2. Background of advanced chronic small vessel disease.  MR angio head- Severe and generalized intracranial atherosclerosis with high-grade bilateral M2 segment narrowings. No progression from CTA yesterday  Interim History / Subjective:  Weaned off Cardene infusion yesterday afternoon.  No acute overnight events.  Objective   Blood pressure 132/86, pulse 77, temperature (!) 97.4 F (36.3 C), temperature source Axillary, resp. rate 13, height 5\' 3"  (1.6 m), weight 70.8 kg, SpO2 93 %.        Intake/Output Summary (Last 24 hours) at 03/09/2021 0728 Last data filed at 03/09/2021 0600 Gross per 24 hour  Intake 163.73 ml  Output 1600 ml  Net -1436.27 ml   Filed Weights   03/06/21 2222 03/07/21 2000 03/08/21 0600  Weight: 69.9 kg 71 kg 70.8 kg    Examination: General:  adult female lying in bed in NAD HEENT: MM pink/moist, no jvd, good dentition  Neuro: Awake, alert, difficulty with word finding, dysarthria, pupils equal/reactive, able to move all extremities to commands, residual 4/5 weakness on right CV: RRR, no murmurs PULM: non-labored at rest, lungs bilaterally clear, on room air GI: soft, bsx4 active  Extremities: warm/dry, no edema  Skin: no rashes or lesions  Resolved Hospital Problem list     Assessment & Plan:  Acute CVA secondary to L M2 occlusion Post-thrombectomy SAH Hypertension Intubated pre-procedure and not extubated 2/2 with concern for developing bleed.  CT head confirms subarachnoid hemorrhage.  Initially started on IV antihypertensives for hypertensive emergency.  Weaned off Cardene yesterday afternoon.  Echocardiogram shows an EF of 60 to 65%, no  regional wall abnormalities mild LV hypertrophy, right heart function normal.  Transfer out of the ICU today.  Plans for SNF versus CIR. -follow neuro exam, assess CT head if neurologic change  -Started on aspirin and Plavix dual antiplatelet therapy for 3 months then aspirin alone -Continue atenolol (half home dose), Norvasc and irbesartan -Continue Crestor 5 mg -Tolerating regular diet   AKI  Cr 1.71, unclear baseline ~1.4-1.5. -Trend BMP / urinary output -Replace electrolytes as indicated -Avoid nephrotoxic agents, ensure adequate renal perfusion   Type 2 DM Hemoglobin A1c 6.2. -SSI   Best practice (evaluated daily)  Diet: dysphagia Pain/Anxiety/Delirium protocol (if indicated): SCD DVT prophylaxis: SCD GI prophylaxis: PPI Glucose control: SSI Disposition:Transfer out of the ICU today   Labs   CBC: Recent Labs  Lab 03/02/21 1903 03/06/21 2231 03/07/21 0412 03/08/21 0426 03/09/21 0310  WBC 10.1 9.2  --  11.5* 8.7  NEUTROABS 6.4 5.0  --   --   --   HGB 13.5 13.4 11.9* 12.6 11.2*  HCT 39.2 39.7 35.0* 36.3 33.7*  MCV 90.7 90.4  --  89.4 90.6  PLT 244 275  --  217 789    Basic Metabolic Panel: Recent Labs  Lab 03/02/21 1903 03/06/21 2231 03/07/21 0412 03/08/21 0426 03/09/21 0310  NA 132* 137 138 139 137  K 4.1 4.3 3.9 3.9 3.7  CL 102 107  --  112* 112*  CO2 22 22  --  17* 19*  GLUCOSE 156* 118*  --  128* 125*  BUN 33* 26*  --  19 17  CREATININE 1.67* 1.97*  --  1.78* 1.71*  CALCIUM 8.5* 8.2*  --  7.6* 7.8*   GFR: Estimated Creatinine Clearance: 22.7 mL/min (A) (by C-G formula based on SCr of 1.71 mg/dL (H)). Recent Labs  Lab 03/02/21 1903 03/06/21 2231 03/08/21 0426 03/09/21 0310  WBC 10.1 9.2 11.5* 8.7    Liver Function Tests: Recent Labs  Lab 03/02/21 1903 03/06/21 2231  AST 16 19  ALT 13 13  ALKPHOS 67 66  BILITOT 0.6 0.3  PROT 7.1 6.9  ALBUMIN 3.8 3.7   No results for input(s): LIPASE, AMYLASE in the last 168 hours. Recent Labs   Lab 03/02/21 1903  AMMONIA 17    ABG    Component Value Date/Time   PHART 7.273 (L) 03/07/2021 0412   PCO2ART 39.4 03/07/2021 0412   PO2ART 93 03/07/2021 0412   HCO3 18.4 (L) 03/07/2021 0412   TCO2 20 (L) 03/07/2021 0412   ACIDBASEDEF 8.0 (H) 03/07/2021 0412   O2SAT 96.0 03/07/2021 0412     Coagulation Profile: Recent Labs  Lab 03/06/21 2231  INR 0.9    Cardiac Enzymes: No results for input(s): CKTOTAL, CKMB, CKMBINDEX, TROPONINI in the last 168 hours.  HbA1C: Hemoglobin A1C  Date/Time Value Ref Range Status  12/09/2020 07:01 PM 6.2 (A) 4.0 - 5.6 % Final  07/10/2020 09:19 AM 6.7 (A) 4.0 - 5.6 % Final   HB A1C (BAYER DCA - WAIVED)  Date/Time Value Ref Range Status  02/26/2019 08:51 AM 6.1 <7.0 % Final    Comment:  Diabetic Adult            <7.0                                       Healthy Adult        4.3 - 5.7                                                           (DCCT/NGSP) American Diabetes Association's Summary of Glycemic Recommendations for Adults with Diabetes: Hemoglobin A1c <7.0%. More stringent glycemic goals (A1c <6.0%) may further reduce complications at the cost of increased risk of hypoglycemia.   09/27/2018 11:22 AM 6.3 <7.0 % Final    Comment:                                          Diabetic Adult            <7.0                                       Healthy Adult        4.3 - 5.7                                                           (DCCT/NGSP) American Diabetes Association's Summary of Glycemic Recommendations for Adults with Diabetes: Hemoglobin A1c <7.0%. More stringent glycemic goals (A1c <6.0%) may further reduce complications at the cost of increased risk of hypoglycemia.    Hgb A1c MFr Bld  Date/Time Value Ref Range Status  03/07/2021 06:24 AM 6.2 (H) 4.8 - 5.6 % Final    Comment:    (NOTE) Pre diabetes:          5.7%-6.4%  Diabetes:              >6.4%  Glycemic control for    <7.0% adults with diabetes   11/09/2018 11:11 AM 6.3 (H) 4.8 - 5.6 % Final    Comment:             Prediabetes: 5.7 - 6.4          Diabetes: >6.4          Glycemic control for adults with diabetes: <7.0     CBG: Recent Labs  Lab 03/08/21 0735 03/08/21 1115 03/08/21 1550 03/08/21 1939 03/08/21 2312  GLUCAP 140* 96 98 141* 134*    Review of Systems:   ROS   Past Medical History:  She,  has a past medical history of Anxiety, Arthritis, Cataract, Dysphagia, Dysrhythmia, Essential hypertension, benign, Fatty liver, GERD (gastroesophageal reflux disease), H/O hypercalcemia (04/12/2017), Hepatitis B, Hepatitis B surface antigen positive, High cholesterol, History of hiatal hernia, Hypercalcemia, Hypothyroidism, Kidney stone (August 2014), Obesity, Osteoporosis, PONV (postoperative nausea and vomiting), and Type 2 diabetes mellitus ([place]).   Surgical History:   Past Surgical  History:  Procedure Laterality Date   ABDOMINAL HYSTERECTOMY     BILATERAL CARPAL TUNNEL RELEASE     Dr. Marlou Sa at surgical center   BIOPSY  05/26/2017   Procedure: BIOPSY;  Surgeon: Rogene Houston, MD;  [facility];  Service: Endoscopy;;  colon   BIOPSY  10/11/2018   Procedure: BIOPSY;  Surgeon: Rogene Houston, MD;  Location[facility];  Service: Endoscopy;;   CATARACT EXTRACTION, BILATERAL     COLONOSCOPY N/A 05/26/2017   Procedure: COLONOSCOPY;  Surgeon: Rogene Houston, MD;  [facility];  Service: Endoscopy;  Laterality: N/A;  10:30   Deviated septum repair     ESOPHAGEAL DILATION N/A 08/16/2019   Procedure: ESOPHAGEAL DILATION;  Surgeon: Rogene Houston, MD;  [facility];  Service: Endoscopy;  Laterality: N/A;   ESOPHAGOGASTRODUODENOSCOPY N/A 07/28/2016   Procedure: ESOPHAGOGASTRODUODENOSCOPY (EGD);  Surgeon: Rogene Houston, MD;  [facility];  Service: Endoscopy;  Laterality: N/A;  300 - moved to 3/7 @ 1:00   ESOPHAGOGASTRODUODENOSCOPY N/A 08/16/2019    Procedure: ESOPHAGOGASTRODUODENOSCOPY (EGD);  Surgeon: Rogene Houston, MD;  [facility];  Service: Endoscopy;  Laterality: N/A;  [place] N/A 10/11/2018   Procedure: FLEXIBLE SIGMOIDOSCOPY;  Surgeon: Rogene Houston, MD;  [facility];  Service: Endoscopy;  Laterality: N/A;  unable to complete colonoscopy    IR CT HEAD LTD  03/07/2021   IR PERCUTANEOUS ART THROMBECTOMY/INFUSION INTRACRANIAL INC DIAG ANGIO  03/07/2021   IR US GUIDE VASC ACCESS RIGHT  03/07/2021   KNEE ARTHROPLASTY Left 09/15/2015   Procedure: COMPUTER ASSISTED TOTAL KNEE ARTHROPLASTY;  Surgeon: Marybelle Killings, MD;  Location: [place];  Service: Orthopedics;  Laterality: Left;   KNEE ARTHROSCOPY     left knee 2003     Social History:   reports that she has never smoked. She has never used smokeless tobacco. She reports that she does not drink alcohol and does not use drugs.   Family History:  Her family history includes COPD in her mother; Cancer in her maternal aunt; Diabetes in her maternal aunt and maternal uncle; Heart attack in her mother; Hip fracture in her father; Hypertension in her father. There is no history of Colon cancer.   Allergies Allergies  Allergen Reactions   Statins Other (See Comments)    'discomfort, aching everywhere' Tolerates to livalo    Zetia [Ezetimibe] Other (See Comments)    Leg pain   Zocor [Simvastatin] Other (See Comments)    Leg pain     Home Medications  Prior to Admission medications   Medication Sig Start Date End Date Taking? Authorizing Provider  Vitamin D, Ergocalciferol, (DRISDOL) 1.25 MG (50000 UNIT) CAPS capsule Take 50,000 Units by mouth once a week. 04/28/20  Yes [provider]  Accu-Chek Softclix Lancets lancets Check BS BID and PRN 07/16/19   Baruch Gouty, FNP  acetaminophen (TYLENOL) 500 MG tablet Take 1,000 mg by mouth daily as needed for moderate pain or headache.    [provider]   Alpha-Lipoic Acid 600 MG CAPS Take 2 capsules by mouth daily.    [provider]  ALPRAZolam Duanne Moron) 0.25 MG tablet Take 1 tablet (0.25 mg total) by mouth 2 (two) times daily as needed for anxiety. Patient taking differently: Take 0.25 mg by mouth at bedtime as needed (sleep.). 07/16/19   Rakes, Connye Burkitt, FNP  ALPRAZolam Duanne Moron)  0.25 MG tablet 1 tablet as needed 08/11/19   [provider]  amLODipine (NORVASC) 10 MG tablet Take 10 mg by mouth daily. 12/09/20   [provider]  amLODipine (NORVASC) 5 MG tablet Take 1 tablet (5 mg total) by mouth daily. 10/31/19   Loman Brooklyn, FNP  atenolol (TENORMIN) 50 MG tablet Take 1 tablet (50 mg total) by mouth daily. 05/23/19   Baruch Gouty, FNP  b complex vitamins tablet Take 1 tablet by mouth daily.    [provider]  Biotin 2500 MCG CAPS Take by mouth daily.    [provider]  cephALEXin (KEFLEX) 500 MG capsule Take 1 capsule (500 mg total) by mouth 4 (four) times daily. 03/02/21   Milton Ferguson, MD  Cholecalciferol (VITAMIN D-3) 25 MCG (1000 UT) CAPS 2 capsule    [provider]  Cyanocobalamin (VITAMIN B12) 1000 MCG TBCR 1 tablet    [provider]  DULoxetine (CYMBALTA) 30 MG capsule Take 1 capsule (30 mg total) by mouth daily. Patient taking differently: Take 30 mg by mouth at bedtime. 05/23/19   Baruch Gouty, FNP  Evolocumab (REPATHA) 140 MG/ML SOSY 1 ml Patient not taking: Reported on 03/02/2021 11/04/20   [provider]  famotidine (PEPCID) 20 MG tablet Take 1 tablet (20 mg total) by mouth at bedtime. Patient taking differently: Take 20 mg by mouth daily. 08/16/19   Marilyn Nihiser, Mechele Dawley, MD  glipiZIDE (GLUCOTROL) 5 MG tablet TAKE 1/2 TABLET BY MOUTH  DAILY ONLY IF HAVING A  LARGE MEAL AT [facility] 04/22/20   Philemon Kingdom, MD  glucose blood (ACCU-CHEK AVIVA PLUS) test strip Test BS 2 times daily Dx E11.21 05/14/19   Baruch Gouty, FNP  hydrocortisone (ANUSOL-HC) 2.5 %  rectal cream Place 1 application rectally 2 (two) times daily. 08/05/20   Breigh Annett, Mechele Dawley, MD  ipratropium (ATROVENT) 0.06 % nasal spray 2 sprays into each nostril Three (3) times a day. 10/25/19   [provider]  irbesartan (AVAPRO) 150 MG tablet TAKE 1 TABLET BY MOUTH  DAILY Patient taking differently: Take 300 mg by mouth daily. 10/30/19   Claretta Fraise, MD  irbesartan (AVAPRO) 150 MG tablet 1 tablet 08/29/19   [provider]  levothyroxine (SYNTHROID) 125 MCG tablet Take 1 tablet (125 mcg total) by mouth daily before breakfast. 01/16/21   Philemon Kingdom, MD  levothyroxine (SYNTHROID) 137 MCG tablet Take 137 mcg by mouth daily before breakfast. Patient not taking: Reported on 03/02/2021 09/18/19   [provider]  pantoprazole (PROTONIX) 40 MG tablet Take 1 tablet (40 mg total) by mouth daily before lunch. 08/05/20   Rogene Houston, MD  Probiotic Product (PROBIOTIC DAILY PO) Take by mouth daily.    [provider]  psyllium (METAMUCIL SMOOTH TEXTURE) 58.6 % powder Take 1 packet by mouth daily. 10/11/18   Brenleigh Collet, Mechele Dawley, MD  rosuvastatin (CRESTOR) 5 MG tablet TAKE 1 TABLET BY MOUTH  DAILY Patient taking differently: Take 5 mg by mouth at bedtime. 05/23/19   Baruch Gouty, FNP  sodium zirconium cyclosilicate (LOKELMA) 10 g PACK packet See admin instructions. 02/25/21 02/25/22  [provider]  calcium citrate-vitamin D 200-200 MG-UNIT TABS Take 1 tablet by mouth daily.    08/10/11  [provider]     Harlow Ohms, DO PGY-3

## 2021-03-09 NOTE — TOC CAGE-AID Note (Signed)
Transition of Care [facility]) - CAGE-AID Screening   Patient Details  Name: Lisa Sutton MRN: [id number] Date of Birth: May 20, 1934  Transition of Care [facility]) CM/SW Contact:    Shivani Barrantes C Tarpley-Carter, [place] Phone Number: 03/09/2021, 1:55 PM   Clinical Narrative: Pt participated in [place].  Pt stated she does not use substance or ETOH.  Pt was not offered resources, due to no usage of substance or ETOH.     Nour Rodrigues Tarpley-Carter, MSW, LCSW-A Pronouns:  She/Her/Hers [facility] HealthTransitions of Care Clinical Social Worker Direct Number:  [phone] [email or web address]  CAGE-AID Screening:    Have You Ever Felt You Ought to Cut Down on Your Drinking or Drug Use?: No Have People Annoyed You By [employer] Your Drinking Or Drug Use?: No Have You Felt Bad Or Guilty About Your Drinking Or Drug Use?: No Have You Ever Had a Drink or Used Drugs First Thing In The Morning to Steady Your Nerves or to Get Rid of a Hangover?: No CAGE-AID Score: 0  Substance Abuse Education Offered: No

## 2021-03-10 DIAGNOSIS — I63512 Cerebral infarction due to unspecified occlusion or stenosis of left middle cerebral artery: Secondary | ICD-10-CM | POA: Diagnosis not present

## 2021-03-10 DIAGNOSIS — I10 Essential (primary) hypertension: Secondary | ICD-10-CM | POA: Diagnosis not present

## 2021-03-10 LAB — CBC
HCT: 33.6 % — ABNORMAL LOW (ref 36.0–46.0)
Hemoglobin: 11.3 g/dL — ABNORMAL LOW (ref 12.0–15.0)
MCH: 30.5 pg (ref 26.0–34.0)
MCHC: 33.6 g/dL (ref 30.0–36.0)
MCV: 90.6 fL (ref 80.0–100.0)
Platelets: 224 10*3/uL (ref 150–400)
RBC: 3.71 MIL/uL — ABNORMAL LOW (ref 3.87–5.11)
RDW: 12.5 % (ref 11.5–15.5)
WBC: 8.1 10*3/uL (ref 4.0–10.5)
nRBC: 0 % (ref 0.0–0.2)

## 2021-03-10 LAB — BASIC METABOLIC PANEL
Anion gap: 7 (ref 5–15)
BUN: 20 mg/dL (ref 8–23)
CO2: 20 mmol/L — ABNORMAL LOW (ref 22–32)
Calcium: 8.3 mg/dL — ABNORMAL LOW (ref 8.9–10.3)
Chloride: 112 mmol/L — ABNORMAL HIGH (ref 98–111)
Creatinine, Ser: 1.56 mg/dL — ABNORMAL HIGH (ref 0.44–1.00)
GFR, Estimated: 32 mL/min — ABNORMAL LOW (ref >60)
Glucose, Bld: 134 mg/dL — ABNORMAL HIGH (ref 70–99)
Potassium: 4.1 mmol/L (ref 3.5–5.1)
Sodium: 139 mmol/L (ref 135–145)

## 2021-03-10 LAB — GLUCOSE, CAPILLARY
Glucose-Capillary: 148 mg/dL — ABNORMAL HIGH (ref 70–99)
Glucose-Capillary: 123 mg/dL — ABNORMAL HIGH (ref 70–99)
Glucose-Capillary: 110 mg/dL — ABNORMAL HIGH (ref 70–99)
Glucose-Capillary: 113 mg/dL — ABNORMAL HIGH (ref 70–99)

## 2021-03-10 NOTE — Evaluation (Signed)
Speech Language Pathology Evaluation Patient Details Name: Lisa Sutton MRN: [id number] DOB: 10-03-1933 Today's Date: 03/10/2021 Time: 1000-1030 SLP Time Calculation (min) (ACUTE ONLY): 30 min  Problem List:  Patient Active Problem List   Diagnosis Date Noted   Hypertensive emergency    Acute ischemic left MCA stroke ([place]) 03/07/2021   Cerebrovascular accident (CVA) ([place])    Encounter for intubation    Pruritus ani 08/05/2020   History of hepatitis B 02/05/2020   Chronic diarrhea 07/24/2019   Proteinuria 07/16/2019   GAD (generalized anxiety disorder) 03/02/2019   Controlled substance agreement signed 03/02/2019   Hyperlipidemia associated with type 2 diabetes mellitus ([place]) 02/19/2019   CKD stage 3 due to type 2 diabetes mellitus ([place]) 02/19/2019   Primary hypertension 02/19/2019   Elevated alkaline phosphatase level 01/15/2019   Stage 3 chronic kidney disease ([place]) 06/15/2017   History of colonic polyps 05/09/2017   Presbycusis of both ears 06/04/2016   Hypothyroidism due to acquired atrophy of thyroid 04/26/2016   Vitamin D deficiency 04/26/2016   Status post total left knee replacement 09/15/2015   Statin intolerance 09/11/2015   Diabetic neuropathy, type II diabetes mellitus ([place]) 07/30/2015   Abnormal chest x-ray 11/27/2014   Primary insomnia 04/12/2013   GERD (gastroesophageal reflux disease) 04/25/2012   Fatty liver 08/09/2011   Esophageal dysphagia 08/09/2011   Type 2 diabetes mellitus with diabetic nephropathy, without long-term current use of insulin ([place]) 08/09/2011   Past Medical History:  Past Medical History:  Diagnosis Date   Anxiety    Arthritis    Cataract    Dysphagia    'sometimes but not a major issue' been checked out by GI (per pt)   Dysrhythmia    'heart used to skip but doesn't anymore' was checked out by Dr. Ron Parker late '90s, everything checked out ok and not had any skipping since (all per pt)   Essential hypertension, benign    Fatty  liver    GERD (gastroesophageal reflux disease)    H/O hypercalcemia 04/12/2017   Hepatitis B    had at age 28, 'GI doc said it's gone away'   Hepatitis B surface antigen positive    High cholesterol    History of hiatal hernia    Hypercalcemia    Hypothyroidism    Kidney stone August 2014   Patient was [facility] at [facility]   Obesity    Osteoporosis    PONV (postoperative nausea and vomiting)    Type 2 diabetes mellitus ([place])    Past Surgical History:  Past Surgical History:  Procedure Laterality Date   ABDOMINAL HYSTERECTOMY     BILATERAL CARPAL TUNNEL RELEASE     Dr. Marlou Sa at surgical center   BIOPSY  05/26/2017   Procedure: BIOPSY;  Surgeon: Rogene Houston, MD;  [facility];  Service: Endoscopy;;  colon   BIOPSY  10/11/2018   Procedure: BIOPSY;  Surgeon: Rogene Houston, MD;  [facility];  Service: Endoscopy;;   CATARACT EXTRACTION, BILATERAL     COLONOSCOPY N/A 05/26/2017   Procedure: COLONOSCOPY;  Surgeon: Rogene Houston, MD;  [facility];  Service: Endoscopy;  Laterality: N/A;  10:30   Deviated septum repair     ESOPHAGEAL DILATION N/A 08/16/2019   Procedure: ESOPHAGEAL DILATION;  Surgeon: Rogene Houston, MD;  [facility];  Service: Endoscopy;  Laterality: N/A;   ESOPHAGOGASTRODUODENOSCOPY N/A 07/28/2016   Procedure: ESOPHAGOGASTRODUODENOSCOPY (EGD);  Surgeon: Rogene Houston, MD;  [facility];  Service: Endoscopy;  Laterality: N/A;  300 - moved to 3/7 @ 1:00   ESOPHAGOGASTRODUODENOSCOPY N/A 08/16/2019   Procedure: ESOPHAGOGASTRODUODENOSCOPY (EGD);  Surgeon: Rogene Houston, MD;  [facility];  Service: Endoscopy;  Laterality: N/A;  [place] N/A 10/11/2018   Procedure: FLEXIBLE SIGMOIDOSCOPY;  Surgeon: Rogene Houston, MD;  [facility];  Service: Endoscopy;  Laterality: N/A;  unable to complete colonoscopy    IR CT HEAD LTD  03/07/2021   IR PERCUTANEOUS ART  THROMBECTOMY/INFUSION INTRACRANIAL INC DIAG ANGIO  03/07/2021   IR US GUIDE VASC ACCESS RIGHT  03/07/2021   KNEE ARTHROPLASTY Left 09/15/2015   Procedure: COMPUTER ASSISTED TOTAL KNEE ARTHROPLASTY;  Surgeon: Marybelle Killings, MD;  Location: [place];  Service: Orthopedics;  Laterality: Left;   KNEE ARTHROSCOPY     left knee 2003   RADIOLOGY WITH ANESTHESIA N/A 03/07/2021   Procedure: IR WITH ANESTHESIA;  Surgeon: Corrie Mckusick, DO;  Location: [facility];  Service: Anesthesiology;  Laterality: N/A;   HPI:  Lisa Sutton is a 87 y.o. female with PMH significant for HTN, GERD, Hep B, HLD, Obesity, osteoporosis who initially presented to [facility] ED with aphasia.  CT angio was done which showed left M2 occlusion, she was brought to [facility], underwent mechanical thrombectomy. MRI was remarkable for acute infarcts in the L MCA territory and right parietal cortex.  Pt was intubated less than 24 hrs on 10/15.  Pt has a hx of esophageal dysphagia.   Assessment / Plan / Recommendation Clinical Impression  Pt presents with a moderate semi fluent aphasia with severe naming/word finding impairment, ability to repeat words but not sentences, poor reading/decoding, but some reading comprehension ability, and moderate auditory comprehension impairment with complex language. Pt also with right inattention and right handed ataxia. Pt able to occasionally utter fluent social phrase, though nouns and verbs are often paraphasic. Pt has intermittent awareness of errors. Is highly stimulable for cueing and correction. Son at bedside; SLP introduced some basic compensatory strategies for improved communication (visual cueing, choices with visual reinforcement). Will benefit from intensive SLP inteventions; will f/u acutely. Recommended for SNF level therapy.    SLP Assessment  SLP Visit Diagnosis: Dysphagia, unspecified (R13.10)    Recommendations for follow up therapy are one component of a multi-disciplinary  discharge planning process, led by the attending physician.  Recommendations may be updated based on patient status, additional functional criteria and insurance authorization.    Follow Up Recommendations  Other (comment) (only for language)    Frequency and Duration min 2x/week  2 weeks      SLP Evaluation Cognition  Overall Cognitive Status: Impaired/Different from baseline Arousal/Alertness: Awake/alert Orientation Level: Oriented to person Attention: Focused;Sustained Focused Attention: Appears intact Sustained Attention: Appears intact Memory:  (UTA) Awareness: Impaired Awareness Impairment: Emergent impairment;Anticipatory impairment Problem Solving: Impaired Problem Solving Impairment: Verbal basic;Functional basic Safety/Judgment: Appears intact       Comprehension  Auditory Comprehension Overall Auditory Comprehension: Impaired Yes/No Questions: Impaired Basic Biographical Questions: 76-100% accurate Basic Immediate Environment Questions: 75-100% accurate Complex Questions: 0-24% accurate Commands: Impaired One Step Basic Commands: 50-74% accurate Two Step Basic Commands: 0-24% accurate Multistep Basic Commands: 0-24% accurate Conversation: Simple Interfering Components: Motor planning;Visual impairments Visual Recognition/Discrimination Discrimination: Exceptions to [facility] L/R Discrimination: Unable to indentify Common Objects: Able in field of 3 Black/White Line Drawings: Able in field of 3 Reading Comprehension Reading Status: Impaired  Word level: Impaired Sentence Level: Impaired Paragraph Level: Impaired    Expression Verbal Expression Overall Verbal Expression: Impaired Initiation: No impairment Level of Generative/Spontaneous Verbalization: Word;Phrase;Sentence Repetition: Impaired Level of Impairment: Sentence level Naming: Impairment Responsive: Not tested Confrontation: Impaired Common Objects: Able in field of 3 Black/White Line Drawings:  Able in field of 3 Convergent: Not tested Divergent: Not tested Verbal Errors: Semantic paraphasias;Phonemic paraphasias;Neologisms;Perseveration;Aware of errors;Not aware of errors Pragmatics: No impairment Effective Techniques: Written cues;Phonemic cues;Sentence completion Written Expression Dominant Hand: Right Written Expression: Exceptions to [facility]   Oral / Motor  Oral Motor/Sensory Function Overall Oral Motor/Sensory Function: Mild impairment Facial ROM: Within Functional Limits Facial Symmetry: Within Functional Limits Facial Strength: Within Functional Limits Lingual ROM: Within Functional Limits Lingual Symmetry: Abnormal symmetry right Lingual Strength: Within Functional Limits Lingual Sensation: Within Functional Limits Motor Speech Overall Motor Speech: Appears within functional limits for tasks assessed   GO                    Arista Kettlewell, Katherene Ponto 03/10/2021, 11:23 AM

## 2021-03-10 NOTE — TOC Initial Note (Signed)
Transition of Care [facility]) - Initial/Assessment Note    Patient Details  Name: Lisa Sutton MRN: [id number] Date of Birth: 1934/02/12  [facility]) CM/SW Contact:    Sable Feil, LCSW Phone Number: 03/10/2021, 12:42 PM  Clinical Narrative:  Talked with Lisa Sutton and her son Lisa Sutton at the bedside regarding her discharge disposition and the recommendation of ST rehab. Lisa Sutton was sitting up in a chair and was alert, oriented, pleasant and willing to talk with CSW. During the conversation, CSW noticed that she has problems with word-finding (per Speech: moderate semi fluent aphasia with severe naming/word finding impairment). Son and patient expressed agreement with ST rehab and Mr. Caponigro provided CSW with the facilities they are interested in: [facility] in [place], [facility] in [place], [facility] in [place] and [facility] in [place]. He requested that her information not be sent to SNF near [facility] or SNF in [place]. When asked, patient and son responded that she has never been to a facility for ST rehab. When asked, son informed CSW that [facility] is on one end of  [place], [facility] is nearby and [facility] is at the edge of [place].               Expected Discharge Plan: [place] Barriers to Discharge: SNF Pending bed offer (Beginning facility search 10/18)   Patient Goals and CMS Choice Patient states their goals for this hospitalization and ongoing recovery are:: Patient agreeable to [place] rehab in a skilled nursing facility, then home CMS Medicare.gov Compare Post Acute Care list provided to:: Other (Comment Required) (Talked with son regarding Medicare.gov and how to access it online) Choice offered to / list presented to : Adult Children (Provided son with Medicare.gov website information)  Expected Discharge Plan and Services Expected Discharge Plan: [place] In-house Referral: Clinical Social Work   Post Acute Care Choice: [place] Living arrangements for the past 2 months: [place]                                      Prior Living Arrangements/Services Living arrangements for the past 2 months: Single Family Home Lives with:: Adult Children (Patient and only child Recruitment consultant live together) Patient language and need for interpreter reviewed:: No Do you feel safe going back to the place where you live?: No   Patient and son in agreement with ST rehab before returning home  Need for Family Participation in Patient Care: Yes (Comment) Care giver support system in place?: Yes (comment) (Son ad patient live together)   Criminal Activity/Legal Involvement Pertinent to Current Situation/Hospitalization: No - Comment as needed  Activities of Daily Living      Permission Sought/Granted Permission sought to share information with : Family Supports Permission granted to share information with : Yes, Verbal Permission Granted  Share Information with NAME: Lisa Sutton     Permission granted to share info w Relationship: Son  Permission granted to share info w Contact Information: [phone] (cell) and [phone] (Home)  Emotional Assessment Appearance:: Appears stated age Attitude/Demeanor/Rapport: Engaged Affect (typically observed): Pleasant Orientation: : Oriented to Self Alcohol / Substance Use: Tobacco Use, Alcohol Use, Illicit Drugs (Per H&P, patient does not smoke, drink alcohol or use illicit drugs) Psych Involvement: No (comment)  Admission diagnosis:  Cerebrovascular accident (CVA), unspecified mechanism ([place]) [I63.9]  Acute ischemic left MCA stroke [facility]) [I63.512] Patient Active Problem List   Diagnosis Date Noted   Hypertensive emergency    Acute ischemic left MCA stroke ([place]) 03/07/2021   Cerebrovascular accident (CVA) ([place])    Encounter for intubation    Pruritus ani  08/05/2020   History of hepatitis B 02/05/2020   Chronic diarrhea 07/24/2019   Proteinuria 07/16/2019   GAD (generalized anxiety disorder) 03/02/2019   Controlled substance agreement signed 03/02/2019   Hyperlipidemia associated with type 2 diabetes mellitus ([place]) 02/19/2019   CKD stage 3 due to type 2 diabetes mellitus ([place]) 02/19/2019   Primary hypertension 02/19/2019   Elevated alkaline phosphatase level 01/15/2019   Stage 3 chronic kidney disease ([place]) 06/15/2017   History of colonic polyps 05/09/2017   Presbycusis of both ears 06/04/2016   Hypothyroidism due to acquired atrophy of thyroid 04/26/2016   Vitamin D deficiency 04/26/2016   Status post total left knee replacement 09/15/2015   Statin intolerance 09/11/2015   Diabetic neuropathy, type II diabetes mellitus ([place]) 07/30/2015   Abnormal chest x-ray 11/27/2014   Primary insomnia 04/12/2013   GERD (gastroesophageal reflux disease) 04/25/2012   Fatty liver 08/09/2011   Esophageal dysphagia 08/09/2011   Type 2 diabetes mellitus with diabetic nephropathy, without long-term current use of insulin ([place]) 08/09/2011   PCP:  Wannetta Sender, FNP Pharmacy:  No Pharmacies Listed    Social Determinants of Health (SDOH) Interventions  No SDOH interventions requested or needed at this time  Readmission Risk Interventions No flowsheet data found.

## 2021-03-10 NOTE — Clinical Social Work Note (Signed)
[place]   Re: Lisa Sutton Date of Birth: 1933-10-03 Date: 03/10/21  TO WHOM IT MAY CONCERN:  Please be advised that the above-named patient will require a short-term nursing home stay - anticipated 30 days or less for rehabilitation and strengthening. The plan is for return home.

## 2021-03-10 NOTE — NC FL2 (Signed)
[place] MEDICAID FL2 LEVEL OF CARE SCREENING TOOL     IDENTIFICATION  Patient Name: Lisa Sutton Birthdate: 1933/10/18 Sex: female Admission Date (Current Location): 03/06/2021  [place] and [place] Number:  [place] (Patient lives in [place], [place])   Facility and Address:  [facility] [place] [address] [place], [place] [address]      Provider Number: [id number]  Attending Physician Name and Address:  Stroke, Md, MD  Relative Name and Phone Number:  Suellyn Meenan - son; [phone] (cell), [phone] (home)    Current Level of Care: SNF Recommended Level of Care: [place] Prior Approval Number:    Date Approved/Denied:   PASRR Number:  (Submitted for PASRR on 10/18. [place] MUST FA#[id number])  Discharge Plan: SNF    Current Diagnoses: Patient Active Problem List   Diagnosis Date Noted   Hypertensive emergency    Acute ischemic left MCA stroke ([place]) 03/07/2021   Cerebrovascular accident (CVA) ([place])    Encounter for intubation    Pruritus ani 08/05/2020   History of hepatitis B 02/05/2020   Chronic diarrhea 07/24/2019   Proteinuria 07/16/2019   GAD (generalized anxiety disorder) 03/02/2019   Controlled substance agreement signed 03/02/2019   Hyperlipidemia associated with type 2 diabetes mellitus ([place]) 02/19/2019   CKD stage 3 due to type 2 diabetes mellitus ([place]) 02/19/2019   Primary hypertension 02/19/2019   Elevated alkaline phosphatase level 01/15/2019   Stage 3 chronic kidney disease ([place]) 06/15/2017   History of colonic polyps 05/09/2017   Presbycusis of both ears 06/04/2016   Hypothyroidism due to acquired atrophy of thyroid 04/26/2016   Vitamin D deficiency 04/26/2016   Status post total left knee replacement 09/15/2015   Statin intolerance 09/11/2015   Diabetic neuropathy, type II diabetes mellitus ([place]) 07/30/2015   Abnormal chest x-ray 11/27/2014   Primary insomnia 04/12/2013   GERD (gastroesophageal reflux  disease) 04/25/2012   Fatty liver 08/09/2011   Esophageal dysphagia 08/09/2011   Type 2 diabetes mellitus with diabetic nephropathy, without long-term current use of insulin ([place]) 08/09/2011    Orientation RESPIRATION BLADDER Height & Weight     Self  Normal Continent, External catheter Weight: 156 lb 1.4 oz (70.8 kg) Height:  5\' 3"  (160 cm)  BEHAVIORAL SYMPTOMS/MOOD NEUROLOGICAL BOWEL NUTRITION STATUS      Continent Diet (DYS 3 diet, thin liquids)  AMBULATORY STATUS COMMUNICATION OF NEEDS Skin   Limited Assist (Min assist per PT) Verbally Normal                       Personal Care Assistance Level of Assistance  Bathing, Feeding, Dressing Bathing Assistance: Limited assistance (Min assist upper body and mod assist lower body) Feeding assistance: Limited assistance (Assistance with set-up) Dressing Assistance: Maximum assistance (Mod assist upper body and max assist lower body)     Functional Limitations Info  Sight, Hearing, Speech Sight Info: Impaired (Wears glasses) Hearing Info: Adequate Speech Info: Adequate    SPECIAL CARE FACTORS FREQUENCY  PT (By licensed PT), OT (By licensed OT), Speech therapy     PT Frequency: Evaluated 10/16; PT at SNF eval and treat a minimum of 5 days per week OT Frequency: Evaluated 10/17; OT at SNF eval and treat, a minimum of 5 days per week     Speech Therapy Frequency: Evalauted 10/16      Contractures Contractures Info: Not present    Additional Factors Info  Code Status, Allergies, Insulin Sliding Scale Code Status  Info: Full Allergies Info: Statins, Zetia, Zocor   Insulin Sliding Scale Info: 0-15 Units 3 times per day with meals       Current Medications (03/10/2021):  This is the current hospital active medication list Current Facility-Administered Medications  Medication Dose Route Frequency Provider Last Rate Last Admin   acetaminophen (TYLENOL) tablet 650 mg  650 mg Oral Q4H PRN Donnetta Simpers, MD       Or    acetaminophen (TYLENOL) 160 MG/5ML solution 650 mg  650 mg Per Tube Q4H PRN Donnetta Simpers, MD       Or   acetaminophen (TYLENOL) suppository 650 mg  650 mg Rectal Q4H PRN Donnetta Simpers, MD       amLODipine (NORVASC) tablet 10 mg  10 mg Oral Daily Rosalin Hawking, MD   10 mg at 03/10/21 1610   aspirin EC tablet 325 mg  325 mg Oral Daily Rosalin Hawking, MD   325 mg at 03/10/21 0917   atenolol (TENORMIN) tablet 25 mg  25 mg Oral Daily Ollis, Brandi L, NP   25 mg at 03/10/21 9604   Chlorhexidine Gluconate Cloth 2 % PADS 6 each  6 each Topical Daily Rosalin Hawking, MD   6 each at 03/10/21 0918   clopidogrel (PLAVIX) tablet 75 mg  75 mg Oral Daily Bailey-Modzik, Delila A, NP   75 mg at 03/10/21 0917   enoxaparin (LOVENOX) injection 30 mg  30 mg Subcutaneous Q24H Rosalin Hawking, MD   30 mg at 03/09/21 2104   insulin aspart (novoLOG) injection 0-15 Units  0-15 Units Subcutaneous TID WC Donnetta Simpers, MD   2 Units at 03/10/21 1143   irbesartan (AVAPRO) tablet 150 mg  150 mg Oral Daily Rosalin Hawking, MD   150 mg at 03/10/21 5409   labetalol (NORMODYNE) injection 5-20 mg  5-20 mg Intravenous Q2H PRN Rosalin Hawking, MD       levothyroxine (SYNTHROID) tablet 125 mcg  125 mcg Oral QAC breakfast Donnetta Simpers, MD   125 mcg at 03/10/21 8119   omega-3 acid ethyl esters (LOVAZA) capsule 2 g  2 g Oral BID Rosalin Hawking, MD   2 g at 03/10/21 0917   pantoprazole (PROTONIX) EC tablet 40 mg  40 mg Oral Daily Rosalin Hawking, MD   40 mg at 03/10/21 1478   senna-docusate (Senokot-S) tablet 1 tablet  1 tablet Oral QHS PRN Donnetta Simpers, MD   1 tablet at 03/08/21 1745     Discharge Medications: Please see discharge summary for a list of discharge medications.  Relevant Imaging Results:  Relevant Lab Results:   Additional Information ss# [id number].  Per son [name] and patient , she has had 3 vaccinations. Two Moderna vaccinations are in EPIC: 06/11/19 and 07/09/19. Patient has vaccination card  [name], LCSW

## 2021-03-10 NOTE — Progress Notes (Signed)
Physical Therapy Treatment Patient Details Name: Lisa Sutton MRN: [id number] DOB: 15-May-1934 Today's Date: 03/10/2021   History of Present Illness 87 y.o. female presents to [facility] hospital 03/06/2021 with aphasia. CTH demonstrates L MCA M2 occlusion. Pt underwent emergent thrombectomy on 03/07/2021. PMH includes HTN, GERD, Hep B, HLD, Obesity, osteoporosis.    PT Comments    Pt with significantly improved gait and balance this session requiring only min physical assist with mod-max cues rather than mod physical assist. Pt appropriate and continues to have right inattention and ataxia needing assist and cues to attend to and interact with environment. Will continue to follow.   HR 66 BP 147/72 pre gait and 148/71 post gait    Recommendations for follow up therapy are one component of a multi-disciplinary discharge planning process, led by the attending physician.  Recommendations may be updated based on patient status, additional functional criteria and insurance authorization.  Follow Up Recommendations  SNF;Supervision for mobility/OOB (as CIR denied)     Equipment Recommendations  Other (comment) (TBD)    Recommendations for Other Services       Precautions / Restrictions Precautions Precautions: Fall Precaution Comments: expressive and receptive aphasia, right inattention and RUE ataxia     Mobility  Bed Mobility               General bed mobility comments: OOB in recliner upon entry    Transfers Overall transfer level: Needs assistance   Transfers: Sit to/from Stand Sit to Stand: Min assist         General transfer comment: min assist to rise from recliner and toilet with cues for hand placement and safety  Ambulation/Gait Ambulation/Gait assistance: Min assist Gait Distance (Feet): 300 Feet Assistive device: 1 person hand held assist Gait Pattern/deviations: Step-through pattern;Decreased stride length   Gait velocity interpretation: <1.8 ft/sec,  indicate of risk for recurrent falls General Gait Details: pt with less physical assist required today able to ambulate with HHA rather than full arm hooking on therapist waist. Pt with periods of ataxia with RLE but improved from last session and requires max cues to attend to environment to right as pt running into a sign and unable to attend to people on right. slow gait   Stairs             Wheelchair Mobility    Modified Rankin (Stroke Patients Only) Modified Rankin (Stroke Patients Only) Pre-Morbid Rankin Score: No symptoms Modified Rankin: Moderately severe disability     Balance Overall balance assessment: Needs assistance   Sitting balance-Leahy Scale: Fair Sitting balance - Comments: EOB and toilet without physical assist   Standing balance support: Single extremity supported;No upper extremity supported   Standing balance comment: at sink with min physical assist with no UE support, single UE support during gait                            Cognition Arousal/Alertness: Awake/alert Behavior During Therapy: WFL for tasks assessed/performed Overall Cognitive Status: Difficult to assess Area of Impairment: Following commands;Safety/judgement;Memory;Problem solving;Attention                   Current Attention Level: Sustained Memory: Decreased short-term memory Following Commands: Follows one step commands inconsistently;Follows one step commands with increased time Safety/Judgement: Decreased awareness of safety;Decreased awareness of deficits   Problem Solving: Difficulty sequencing;Requires verbal cues;Requires tactile cues General Comments: pt with automatic speech grossly 80% accurate with pt able to  follow simple commands with 70% accuracy with increased accuracy when directed toward left visual field vs right      Exercises      General Comments        Pertinent Vitals/Pain Pain Assessment: No/denies pain    Home Living                       Prior Function            PT Goals (current goals can now be found in the care plan section) Progress towards PT goals: Progressing toward goals    Frequency    Min 4X/week      PT Plan Current plan remains appropriate    Co-evaluation              AM-PAC PT "6 Clicks" Mobility   Outcome Measure  Help needed turning from your back to your side while in a flat bed without using bedrails?: A Little Help needed moving from lying on your back to sitting on the side of a flat bed without using bedrails?: A Little Help needed moving to and from a bed to a chair (including a wheelchair)?: A Little Help needed standing up from a chair using your arms (e.g., wheelchair or bedside chair)?: A Little Help needed to walk in hospital room?: A Lot Help needed climbing 3-5 steps with a railing? : Total 6 Click Score: 15    End of Session Equipment Utilized During Treatment: Gait belt Activity Tolerance: Patient tolerated treatment well Patient left: in chair;with call bell/phone within reach;with chair alarm set Nurse Communication: Mobility status PT Visit Diagnosis: Other abnormalities of gait and mobility (R26.89);Unsteadiness on feet (R26.81);Muscle weakness (generalized) (M62.81);Other symptoms and signs involving the nervous system (R29.898)     Time: 2778-2423 PT Time Calculation (min) (ACUTE ONLY): 30 min  Charges:  $Gait Training: 8-22 mins $Therapeutic Activity: 8-22 mins                    Zonie Crutcher P, PT Acute Rehabilitation Services Pager: [phone] Office: [place] 03/10/2021, 9:41 AM

## 2021-03-10 NOTE — Progress Notes (Signed)
Speech Language Pathology Treatment: Dysphagia  Patient Details Name: Lisa Sutton MRN: [id number] DOB: November 12, 1933 Today's Date: 03/10/2021 Time: 1000-1030 SLP Time Calculation (min) (ACUTE ONLY): 30 min  Assessment / Plan / Recommendation Clinical Impression  Pt tolerating liquid and solids well with no oral dysphagia. She continues to need cut up foods and assist with meals due to right inattention and ataxia of dominant hand. No SLP f/u needed for swallowing; will continue to treat aphasia - see next note.   HPI HPI: Lisa Sutton is a 87 y.o. female with PMH significant for HTN, GERD, Hep B, HLD, Obesity, osteoporosis who initially presented to [facility] ED with aphasia.  CT angio was done which showed left M2 occlusion, she was brought to [facility], underwent mechanical thrombectomy. MRI was remarkable for acute infarcts in the L MCA territory and right parietal cortex.  Pt was intubated less than 24 hrs on 10/15.  Pt has a hx of esophageal dysphagia.      SLP Plan  All goals met      Recommendations for follow up therapy are one component of a multi-disciplinary discharge planning process, led by the attending physician.  Recommendations may be updated based on patient status, additional functional criteria and insurance authorization.    Recommendations  Diet recommendations: Dysphagia 3 (mechanical soft);Thin liquid Liquids provided via: Cup;Straw Medication Administration: Whole meds with liquid Supervision: Staff to assist with self feeding;Full supervision/cueing for compensatory strategies Compensations: Slow rate;Small sips/bites Postural Changes and/or Swallow Maneuvers: Out of bed for meals                Oral Care Recommendations: Oral care BID Follow up Recommendations: Other (comment) (only for language) SLP Visit Diagnosis: Dysphagia, unspecified (R13.10) Plan: All goals met       GO                Starla Deller, Katherene Ponto  03/10/2021, 11:02 AM

## 2021-03-10 NOTE — Progress Notes (Signed)
[facility] TeleNeurology Consult Services   Patient Name:   Lisa Sutton, Lisa Sutton Date of Birth:   Dec 11, 1933 Identification Number:   MRN - [id number] Date of Service:   03/06/2021 22:32:01  Diagnosis:       I63.00 - Cerebrovascular accident (CVA) due to thrombosis of precerebral artery ([place])  Impression:      Patient with history of HTN, HLD, LKW 1820 presented to the hospital with symptoms of confusion, bizarre behavior, inability to follow commands. On my exam she was noted ot have receptive aphasia, sent for CTA head/neck to exclude MCA thrombus and was found to have a left M2 occlusion. Symptoms are compatible with a left hemispheric MCA stroke. Given LKW and time of assessment, unfortunately she was outside the window for IV thrombolytic therapy. Case discussed with NIR and they have accepted the patient for mechanical thrombectomy. Case d/w with ER physician as well.  Metrics: Last Known Well: 03/06/2021 18:20:00 [facility] Notification Time: 03/06/2021 22:32:01 Arrival Time: 03/06/2021 22:10:00 Stamp Time: 03/06/2021 22:32:01 Initial Response Time: 03/06/2021 22:32:55 Symptoms: confusion, failure to follow directions. NIHSS Start Assessment Time: 03/06/2021 22:40:20 Patient is not a candidate for Thrombolytic. Thrombolytic Medical Decision: 03/06/2021 22:51:41 Patient was not deemed candidate for Thrombolytic because of following reasons: Last Well Known Above 4.5 Hours.  CT head showed no acute hemorrhage or acute core infarct. CT head was reviewed and results were: I personally Reviewed the CT Head and it [facility]  ED Physician notified of diagnostic impression and management plan on 03/06/2021 23:46:00  Advanced Imaging: CTA Head and Neck Completed.  CTP Completed.  LVO:Yes  Discussed with NIR:Yes  Discussed with [facility] Time:03/06/2021 23:45:00  Discussed with [facility] Text:  accepted for MT for left M2 occlusion and penumbra of 54   Our  recommendations are outlined below.  Recommendations:        Stroke/Telemetry Floor       Neuro Checks       Bedside Swallow Eval       DVT Prophylaxis       IV Fluids, Normal Saline       Head of Bed 30 Degrees       Euglycemia and Avoid Hyperthermia (PRN Acetaminophen)       Initiate or continue Aspirin 325 MG daily       Antihypertensives PRN if Blood pressure is greater than 220/120 or there is a concern for End organ damage/contraindications for permissive HTN. If blood pressure is greater than 220/120 give labetalol PO or IV or Vasotec IV with a goal of 15% reduction in BP during the first 24 hours.  Routine Consultation with [facility] for Follow up Care  Sign Out:       Discussed with Emergency Department Provider    ------------------------------------------------------------------------------  History of Present Illness: Patient is a 87 year old Female.  Patient was brought by private transportation with symptoms of confusion, failure to follow directions.  Patient is an 87 yr old woman with history of hypertension and hyperlipidemia who presents for confusion and unusual behavior. She has difficulty following directions. At [place] she was attempted to eat and could not hold the fork appropriately which is very unusual for her. On exam she appears aphasic more receptive aphasia than expressive.   Past Medical History:      Hypertension      Diabetes Mellitus      Hyperlipidemia      CKD stage 3  Social History:Unable to obtain due to Patient Status  Anticoagulant use:  No  Antiplatelet use: No  Allergies:  Reviewed    Examination: BP(174/90), Pulse(67), Blood Glucose(111) 1A: Level of Consciousness - Alert; keenly responsive + 0 1B: Ask Month and Age - Both Questions Right + 0 1C: Blink Eyes & Squeeze Hands - Performs 0 Tasks + 2 2: Test Horizontal Extraocular Movements - Normal + 0 3: Test Visual Fields - No Visual Loss + 0 4: Test Facial Palsy  (Use Grimace if Obtunded) - Normal symmetry + 0 5A: Test Left Arm Motor Drift - No Drift for 10 Seconds + 0 5B: Test Right Arm Motor Drift - No Drift for 10 Seconds + 0 6A: Test Left Leg Motor Drift - No Drift for 5 Seconds + 0 6B: Test Right Leg Motor Drift - No Drift for 5 Seconds + 0 7: Test Limb Ataxia (FNF/Heel-Shin) - No Ataxia + 0 8: Test Sensation - Normal; No sensory loss + 0 9: Test Language/Aphasia - Severe Aphasia: Fragmentary Expression, Inference Needed, Cannot Identify Materials + 2 10: Test Dysarthria - Normal + 0 11: Test Extinction/Inattention - No abnormality + 0  NIHSS Score: 4   Pre-Morbid Modified Rankin Scale: 0 Points = No symptoms at all   Patient/Family was informed the Neurology Consult would occur via TeleHealth consult by way of interactive audio and video telecommunications and consented to receiving care in this manner.   Patient is being evaluated for possible acute neurologic impairment and high probability of imminent or life-threatening deterioration. I spent total of 70 minutes providing care to this patient, including time for face to face visit via telemedicine, review of medical records, imaging studies and discussion of findings with providers, the patient and/or family.   Dr Jacqulynn Cadet Reneshia Zuccaro   [facility] [phone]  Case [id number]

## 2021-03-10 NOTE — Progress Notes (Addendum)
STROKE TEAM PROGRESS NOTE   ATTENDING NOTE: I reviewed above note and agree with the assessment and plan. Pt was seen and examined.   Patient sitting in chair, 2 sons are at bedside.  Patient has no acute event overnight, neuro stable, smiling and laughing during exam, however still has aphasia, expressive more than receptive.  Able to follow most simple commands which has improved from yesterday.  However, still has word finding difficulty and she is aware of it.  Moving all extremities.  On DAPT and low vase at this time.  PT/OT recommend SNF.  Social worker is working on that.  For detailed assessment and plan, please refer to above as I have made changes wherever appropriate.   Lisa Hawking, MD PhD Stroke Neurology 03/10/2021 5:03 PM    INTERVAL HISTORY Her son is at the bedside.  She is sitting in the chair. No voiced complaints. Patient is HOH and reads lips. She is still with word finding issues, aphasia and with following commands. No new issues or concerns voiced at this time. All questions answered.   Vitals:   03/10/21 0813 03/10/21 0917 03/10/21 1135 03/10/21 1200  BP: (!) 148/71 (!) 142/75  (!) 151/69  Pulse:  60  60  Resp: 15   11  Temp:   98.1 F (36.7 C)   TempSrc:   Oral   SpO2:    98%  Weight:      Height:       CBC:  Recent Labs  Lab 03/06/21 2231 03/07/21 0412 03/09/21 0310 03/10/21 0319  WBC 9.2   < > 8.7 8.1  NEUTROABS 5.0  --   --   --   HGB 13.4   < > 11.2* 11.3*  HCT 39.7   < > 33.7* 33.6*  MCV 90.4   < > 90.6 90.6  PLT 275   < > 217 224   < > = values in this interval not displayed.   Basic Metabolic Panel:  Recent Labs  Lab 03/09/21 0310 03/10/21 0319  NA 137 139  K 3.7 4.1  CL 112* 112*  CO2 19* 20*  GLUCOSE 125* 134*  BUN 17 20  CREATININE 1.71* 1.56*  CALCIUM 7.8* 8.3*   Lipid Panel:  Recent Labs  Lab 03/07/21 0624 03/08/21 0426  CHOL 258*  --   TRIG 751* 216*  HDL 42  --   CHOLHDL 6.1  --   VLDL UNABLE TO CALCULATE IF  TRIGLYCERIDE OVER 400 mg/dL  --   LDLCALC UNABLE TO CALCULATE IF TRIGLYCERIDE OVER 400 mg/dL  --    HgbA1c:  Recent Labs  Lab 03/07/21 0624  HGBA1C 6.2*   Urine Drug Screen:  Recent Labs  Lab 03/06/21 2352  LABOPIA NONE DETECTED  COCAINSCRNUR NONE DETECTED  LABBENZ NONE DETECTED  AMPHETMU NONE DETECTED  THCU NONE DETECTED  LABBARB NONE DETECTED    Alcohol Level  Recent Labs  Lab 03/06/21 2231  ETH <10    IMAGING past 24 hours No results found.  PHYSICAL EXAM  Temp:  [97.7 F (36.5 C)-98.1 F (36.7 C)] 98.1 F (36.7 C) (10/18 1135) Pulse Rate:  [59-75] 60 (10/18 1200) Resp:  [11-45] 11 (10/18 1200) BP: (113-159)/(63-88) 151/69 (10/18 1200) SpO2:  [93 %-98 %] 98 % (10/18 1200)  General - Well nourished, well developed, in no apparent distress.  Ophthalmologic - fundi not visualized due to noncooperation.  Cardiovascular - Regular rhythm and rate.  Mental Status -  Awake, alert and able  to state her name. Still with expressive and receptive aphasia. She is able to follow very simple one step commands at times, she does more mimicing of examiner  Cranial Nerves II - XII - II - Visual field appear to be intact III, IV, VI - Extraocular movements intact. V - Facial sensation intact bilaterally. VII - Facial movement intact bilaterally. VIII - Hearing & vestibular intact bilaterally. X - Palate elevates symmetrically. XI - Chin turning & shoulder shrug intact bilaterally. XII - Tongue protrusion intact.  Motor Strength - appears to be weaker on the right hemibody. LUE and LLE grossly intact Motor Tone - Muscle tone was assessed at the neck and appendages and was normal  Sensory - Light touch, temperature/pinprick were assessed and appear to be symmetrical.    Coordination - unable to assess  Gait and Station - deferred.   ASSESSMENT/PLAN [name] is a 87 y.o. female with history of  HTN, GERD, Hep B, HLD, Obesity, osteoporosis who initially  presented to [facility] ED with aphasia. Was evaluated by teleneurology, LKW of 1815, did not seem like she was eating right at 1830 and difficulty holding a fork. Baseline mRS is a 0. CTH with ASPECTS of 10, CTA with L MCA M2 occlusion with a 54cc penumbra and a 4cc core. She was outside thrombolytics window and was emergently transferred to [facility] for thrombectomy.  Stroke: Left M2 inferior division emergent large vessel occlusion s/p mechanical thrombectomy with TICI 2c result with Dr.Wagner 10/15 0200. Subsequent imaging by CT showing hyperdensity overlying the left frontal, parietal, and occipital lobes not in the location of the infarct seen on the prior CT perfusion raises concern for subarachnoid hemorrhage. Etiology cryptogenic Code Stroke CT head. No acute intracranial abnormality. ASPECTS is 10. Chronic ischemic microangiopathy and multiple old small vessel infarcts. CTA head & neck : Proximal left MCA M2 segment occlusion with 4 mL left MCA territory core infarct and 54 mL ischemic penumbra. Cerebral angio s/p mechanical thrombectomy with TICI 2c result.  Post IR CT : Concern for [facility] with small volume MRI and MRA  1. Patchy acute infarcts clustered in the superior division left MCA territory but also seen elsewhere in the left MCA territory and to a limited extent the right parietal cortex. 2. Background of advanced chronic small vessel disease. Intracranial MRA: Severe and generalized intracranial atherosclerosis with high-grade bilateral M2 segment narrowings. No progression from CTA yesterday. Repeat [place] 10/15 : 1. Hyperdensity overlying the left frontal, parietal, and occipital lobes not in the location of the infarct seen on the prior CT perfusion raises concern for subarachnoid hemorrhage. The appearance is similar to the findings on the intra procedural CT. 2. Asymmetric hyperdensity in the left basal ganglia may reflect contrast staining.  2D Echo EF 60-65% Bilat LE [place] No  evidence of DVT bilaterally LDL UNABLE TO CALCULATE IF TRIGLYCERIDE OVER 400 mg/dL HgbA1c 6.2 VTE prophylaxis - SCd's     Diet   DIET DYS 3 Room service appropriate? Yes with Assist; Fluid consistency: Thin   No antithrombotic prior to admission, now on aspirin 325 mg daily and clopidogrel 75 mg daily. 3 months and then ASA alone  Therapy recommendations:  SNF Disposition:  pending   Hypertension Home meds:  atenolol, norvasc, irbesartan UnStable-cardene gtt is off BP goal is 180/105  for now Long-term BP goal normotensive  Hyperlipidemia Home meds: Repatha listed but reported as not taking, family reports stopped after one week due leg aches. Chart  review shows notes stating she is "statin intolerant". Now on home crestor 5 and will continue her repatha as outpt. LDL UNABLE TO CALCULATE IF TRIGLYCERIDE OVER 400 mg/dL, goal < 70 LDL-direct 147.9 Continue statin at discharge  Diabetes type II Controlled Home meds:  glucotrol prn for "big meal" at home  HgbA1c 6.2, goal < 7.0 CBGs Recent Labs    03/09/21 2131 03/10/21 0746 03/10/21 1133  GLUCAP 220* 148* 123*    SSI  S/p intubation with mechanical ventilation Intubated pre-procedure and not initially extubated 2/2 concern for developing bleed Extubated 10/15 1635 Tolerating well  Feeding/Nutrition Has passed swallow evaluation for dysphagia 3 diet Monitor intake      AKI Cre 1.46->1.67->1.97->1.78-->1.56 Saline locked MIVF Now on diet, monitor intake   Other Stroke Risk Factors Advanced Age >/= 66    Other Active Problems Anxiety:  Cymbalta daily, Xanax prn on home list (need to sort out her actual use when able) Hypothyroidism: continue home [facility] day # 3  Beulah Gandy, NP   To contact Stroke Continuity provider, please refer to [email or web address] After hours, contact General Neurology

## 2021-03-10 NOTE — TOC Progression Note (Addendum)
Transition of Care [facility]) - Progression Note    Patient Details  Name: Lisa Sutton MRN: [id number] Date of Birth: February 01, 1934  Transition of Care [facility]) CM/SW Contact  [name], LCSW Phone Number: 03/10/2021, 4:40 PM  Clinical Narrative: Patient information faxed out to [facility], and some skilled nursing facilities in surrounding counties. CSW contacted patient and son's preference [place] [phone]) and message left (4:35 pm).       Expected Discharge Plan: [place] Barriers to Discharge: SNF Pending bed offer (Beginning facility search 10/18)  Expected Discharge Plan and Services Expected Discharge Plan: [place] In-house Referral: Clinical Social Work   Post Acute Care Choice: [place] Living arrangements for the past 2 months: Single Family Home                                       Social Determinants of Health (SDOH) Interventions  No SDOH interventions requested or needed at this time.  Readmission Risk Interventions No flowsheet data found.

## 2021-03-11 DIAGNOSIS — N179 Acute kidney failure, unspecified: Secondary | ICD-10-CM | POA: Diagnosis not present

## 2021-03-11 DIAGNOSIS — I63512 Cerebral infarction due to unspecified occlusion or stenosis of left middle cerebral artery: Secondary | ICD-10-CM | POA: Diagnosis not present

## 2021-03-11 DIAGNOSIS — I10 Essential (primary) hypertension: Secondary | ICD-10-CM | POA: Diagnosis not present

## 2021-03-11 LAB — CBC
HCT: 35.2 % — ABNORMAL LOW (ref 36.0–46.0)
Hemoglobin: 12 g/dL (ref 12.0–15.0)
MCH: 30.5 pg (ref 26.0–34.0)
MCHC: 34.1 g/dL (ref 30.0–36.0)
MCV: 89.3 fL (ref 80.0–100.0)
Platelets: 245 10*3/uL (ref 150–400)
RBC: 3.94 MIL/uL (ref 3.87–5.11)
RDW: 12.4 % (ref 11.5–15.5)
WBC: 8.2 10*3/uL (ref 4.0–10.5)
nRBC: 0 % (ref 0.0–0.2)

## 2021-03-11 LAB — BASIC METABOLIC PANEL
Anion gap: 8 (ref 5–15)
BUN: 23 mg/dL (ref 8–23)
CO2: 17 mmol/L — ABNORMAL LOW (ref 22–32)
Calcium: 8 mg/dL — ABNORMAL LOW (ref 8.9–10.3)
Chloride: 108 mmol/L (ref 98–111)
Creatinine, Ser: 1.52 mg/dL — ABNORMAL HIGH (ref 0.44–1.00)
GFR, Estimated: 33 mL/min — ABNORMAL LOW (ref >60)
Glucose, Bld: 125 mg/dL — ABNORMAL HIGH (ref 70–99)
Potassium: 4.2 mmol/L (ref 3.5–5.1)
Sodium: 133 mmol/L — ABNORMAL LOW (ref 135–145)

## 2021-03-11 LAB — GLUCOSE, CAPILLARY
Glucose-Capillary: 120 mg/dL — ABNORMAL HIGH (ref 70–99)
Glucose-Capillary: 144 mg/dL — ABNORMAL HIGH (ref 70–99)
Glucose-Capillary: 105 mg/dL — ABNORMAL HIGH (ref 70–99)
Glucose-Capillary: 144 mg/dL — ABNORMAL HIGH (ref 70–99)

## 2021-03-11 LAB — LIPID PANEL
Cholesterol: 219 mg/dL — ABNORMAL HIGH (ref 0–200)
HDL: 38 mg/dL — ABNORMAL LOW (ref >40)
LDL Cholesterol: 123 mg/dL — ABNORMAL HIGH (ref 0–99)
Total CHOL/HDL Ratio: 5.8 RATIO
Triglycerides: 290 mg/dL — ABNORMAL HIGH (ref <150)
VLDL: 58 mg/dL — ABNORMAL HIGH (ref 0–40)

## 2021-03-11 LAB — RESP PANEL BY RT-PCR (FLU A&B, COVID) ARPGX2
Influenza A by PCR: NEGATIVE
Influenza B by PCR: NEGATIVE
SARS Coronavirus 2 by RT PCR: NEGATIVE

## 2021-03-11 NOTE — Progress Notes (Addendum)
STROKE TEAM PROGRESS NOTE    INTERVAL HISTORY Patient sitting comfortably in chair with son coming in and out of room.  No acute events overnight, hemodynamically stable with stable neuro exam.  Expressive and receptive aphasia noted.  Patient is able to follow simple commands but requires repeated instructions and demonstration at times. She is aware of her aphasia and expresses frustration at difficulties with word finding.  She is on DAPT with ASA and Plavix for secondary prevention at this time.  PT/OT recommending short SNF stay for rehabilitation.  Social worker attempting to find placement.  Patient has no questions or concerns at this time.  Vitals:   03/11/21 0732 03/11/21 0800 03/11/21 0900 03/11/21 0919  BP:  124/74 (!) 152/56 (!) 152/56  Pulse:  (!) 59 60 73  Resp:  16 16   Temp: 97.6 F (36.4 C)     TempSrc: Oral     SpO2:  97% 98%   Weight:      Height:       CBC:  Recent Labs  Lab 03/06/21 2231 03/07/21 0412 03/10/21 0319 03/11/21 0229  WBC 9.2   < > 8.1 8.2  NEUTROABS 5.0  --   --   --   HGB 13.4   < > 11.3* 12.0  HCT 39.7   < > 33.6* 35.2*  MCV 90.4   < > 90.6 89.3  PLT 275   < > 224 245   < > = values in this interval not displayed.    Basic Metabolic Panel:  Recent Labs  Lab 03/10/21 0319 03/11/21 0229  NA 139 133*  K 4.1 4.2  CL 112* 108  CO2 20* 17*  GLUCOSE 134* 125*  BUN 20 23  CREATININE 1.56* 1.52*  CALCIUM 8.3* 8.0*    Lipid Panel:  Recent Labs  Lab 03/07/21 0624 03/08/21 0426  CHOL 258*  --   TRIG 751* 216*  HDL 42  --   CHOLHDL 6.1  --   VLDL UNABLE TO CALCULATE IF TRIGLYCERIDE OVER 400 mg/dL  --   LDLCALC UNABLE TO CALCULATE IF TRIGLYCERIDE OVER 400 mg/dL  --     HgbA1c:  Recent Labs  Lab 03/07/21 0624  HGBA1C 6.2*    Urine Drug Screen:  Recent Labs  Lab 03/06/21 2352  LABOPIA NONE DETECTED  COCAINSCRNUR NONE DETECTED  LABBENZ NONE DETECTED  AMPHETMU NONE DETECTED  THCU NONE DETECTED  LABBARB NONE DETECTED      Alcohol Level  Recent Labs  Lab 03/06/21 2231  ETH <10     IMAGING past 24 hours No results found.  PHYSICAL EXAM  Temp:  [97.6 F (36.4 C)-98.2 F (36.8 C)] 97.6 F (36.4 C) (10/19 0732) Pulse Rate:  [53-73] 73 (10/19 0919) Resp:  [11-18] 16 (10/19 0900) BP: (114-156)/(56-107) 152/56 (10/19 0919) SpO2:  [95 %-99 %] 98 % (10/19 0900)  General - Well nourished, well developed, in no apparent distress.  Cardiovascular - Regular rhythm and rate, sinus bradycardia  Mental Status -  Awake and alert, oriented to person and place but disoriented to time.  Able to follow simple commands but requires repetition of command and demonstration at times.  Cranial Nerves II - XII - V - Facial sensation diminished on right  VII - Facial movement intact bilaterally. VIII - Hearing & vestibular intact bilaterally. XII - Tongue protrusion intact.  Sensation: Diminished on right hemibody  Motor Strength - RUE 4/5 strength, LUE 5/5 strength, BLE 5/5 strength  Motor Tone -  Muscle tone was assessed at the neck and appendages and was normal  No pronator drift noted in BUE, no drift noted in BLE  Coordination - unable to assess  Gait and Station - deferred.   ASSESSMENT/PLAN [name] is a 87 y.o. female with history of  HTN, GERD, Hep B, HLD, Obesity, osteoporosis who initially presented to [facility] ED with aphasia. Was evaluated by teleneurology, LKW of 1815, did not seem like she was eating right at 1830 and difficulty holding a fork. Baseline mRS is a 0. CTH with ASPECTS of 10, CTA with L MCA M2 occlusion with a 54cc penumbra and a 4cc core. She was outside thrombolytics window and was emergently transferred to [facility] for thrombectomy.  Stroke: Left M2 inferior division emergent large vessel occlusion s/p mechanical thrombectomy with TICI 2c result with Dr.Wagner 10/15 0200. Subsequent imaging by CT showing hyperdensity overlying the left frontal, parietal, and  occipital lobes not in the location of the infarct seen on the prior CT perfusion raises concern for subarachnoid hemorrhage. Etiology cryptogenic Code Stroke CT head. No acute intracranial abnormality. ASPECTS is 10. Chronic ischemic microangiopathy and multiple old small vessel infarcts. CTA head & neck : Proximal left MCA M2 segment occlusion with 4 mL left MCA territory core infarct and 54 mL ischemic penumbra. Cerebral angio s/p mechanical thrombectomy with TICI 2c result.  Post IR CT : Concern for [facility] with small volume MRI and MRA  1. Patchy acute infarcts clustered in the superior division left MCA territory but also seen elsewhere in the left MCA territory and to a limited extent the right parietal cortex. 2. Background of advanced chronic small vessel disease. Intracranial MRA: Severe and generalized intracranial atherosclerosis with high-grade bilateral M2 segment narrowings. No progression from CTA yesterday. Repeat Kiana 10/15 : 1. Hyperdensity overlying the left frontal, parietal, and occipital lobes not in the location of the infarct seen on the prior CT perfusion raises concern for subarachnoid hemorrhage. The appearance is similar to the findings on the intra procedural CT. 2. Asymmetric hyperdensity in the left basal ganglia may reflect contrast staining.  2D Echo EF 60-65% Bilat LE [place] No evidence of DVT bilaterally LDL UNABLE TO CALCULATE IF TRIGLYCERIDE OVER 400 mg/dL- repeat lipid panel ordered HgbA1c 6.2 VTE prophylaxis - SCd's     Diet   DIET DYS 3 Room service appropriate? Yes with Assist; Fluid consistency: Thin   No antithrombotic prior to admission, now on aspirin 325 mg daily and clopidogrel 75 mg daily. 3 months and then ASA alone  Therapy recommendations:  SNF Disposition:  to SNF when bed available  Hypertension Home meds:  atenolol, norvasc, irbesartan Stable BP goal is 180/105  for now Long-term BP goal normotensive  Hyperlipidemia Home meds: Repatha listed  but reported as not taking, family reports stopped after one week due leg aches. Chart review shows notes stating she is "statin intolerant". Will defer decision to start Lovaza to primary care team LDL UNABLE TO CALCULATE IF TRIGLYCERIDE OVER 400 mg/dL, goal < 70 Repeat lipid panel ordered LDL-direct 147.9 Continue statin at discharge  Diabetes type II Controlled Home meds:  glucotrol prn for "big meal" at home  HgbA1c 6.2, goal < 7.0 CBGs Recent Labs    03/10/21 1619 03/10/21 2124 03/11/21 0725  GLUCAP 110* 113* 120*    SSI  S/p intubation with mechanical ventilation Intubated pre-procedure and not initially extubated 2/2 concern for developing bleed Extubated 10/15 1635 Tolerating well  Feeding/Nutrition Has  passed swallow evaluation for dysphagia 3 diet Monitor intake      AKI Cre 1.46->1.67->1.97->1.78-->1.56--> 1.52 [place] locked MIVF Now on diet, monitor intake   Other Stroke Risk Factors Advanced Age >/= 69    Other Active Problems Anxiety:  Cymbalta daily, Xanax prn on home list (need to sort out her actual use when able) Hypothyroidism: continue home synthroid    Hospital day # [place], NP  ATTENDING NOTE: I reviewed above note and agree with the assessment and plan. Pt was seen and examined.   Pt family member Ed is at the bedside.  Patient sitting in chair, awake alert, pleasant, in good spirit.  Still has expressive aphasia more than receptive aphasia but slightly improved from yesterday.  No acute event overnight.  Currently waiting for SNF bed.  Continue current treatment.  For detailed assessment and plan, please refer to above as I have made changes wherever appropriate.   Rosalin Hawking, MD PhD Stroke Neurology 03/11/2021 6:51 PM    To contact Stroke Continuity provider, please refer to [email or web address] After hours, contact General Neurology

## 2021-03-11 NOTE — TOC Progression Note (Addendum)
Transition of Care [facility]) - Progression Note    Patient Details  Name: [name] MRN: [id number] Date of Birth: May 18, 1934  Transition of Care [facility]) CM/SW Contact  [name], LCSW Phone Number: 03/11/2021, 8:51 AM  Clinical Narrative:  Call made to [facility] (8:48 am) and message left for admissions director regarding patient. CSW will continue working on SNF placement for patient.  11:37 pm: Talked with Belenda Cruise at [facility] regarding patient. She requested that her clinicals be faxed to them ([phone]) and she will review and get back with CSW. Clinicals faxed at 12:21 pm.  Follow-up calls made at 2:18 pm and 3:17 pm to [place] and messages left.  3:22 pm: Talked with Belenda Cruise at [facility] and they reviewed patient's information and can accept her for [place] [facility], however they won't have a female bed until Friday. Belenda Cruise informed that [facility] will talk with patient and doctor and get back in touch with them. Face sheet requested and it was faxed to facility.  Visited with patient and her son-in-law Valda Favia regarding [facility] not having a bed until Friday and info regarding other SNF's that was contacted. Ms. Calbert wants to discharge to [facility] and was informed that MD would be contacted. Secure chat sent to MD and he is fine with patient discharging on Friday to SNF.   4:06 pm: Call made to [facility] [phone]) and message left regarding patient wanting bed and can discharge to them on Friday. CSW also gave this CSW's phone number and asked to be contacted.    CSW will continue to follow and provide SW interventions services through discharge.  Expected Discharge Plan: [place] Barriers to Discharge: SNF Pending bed offer (Beginning facility search 10/18)  Expected Discharge Plan and Services Expected Discharge Plan: [place] In-house Referral: Clinical Social Work   Post Acute Care Choice: [place] Living arrangements for the past 2 months: Single Family Home                                       Social Determinants of Health (SDOH) Interventions  No SDOH interventions requested or needed at this time.  Readmission Risk Interventions No flowsheet data found.

## 2021-03-11 NOTE — Progress Notes (Signed)
Occupational Therapy Treatment Patient Details Name: Lisa Sutton MRN: [id number] DOB: [birth year] Today's Date: 03/11/2021   History of present illness 87 y.o. female presents to [facility] hospital 03/06/2021 with aphasia. CTH demonstrates L MCA M2 occlusion. Pt underwent emergent thrombectomy on 03/07/2021. PMH includes HTN, GERD, Hep B, HLD, Obesity, osteoporosis.   OT comments  Patient seated in recliner and agreeable to OT session. Pt requires min assist for mobility and safety in room, engaged in ADL tasks and requires max cueing to locate items on R side of tray table.  Attempting to place toothpaste on comb, and requires max cueing to re-direct to locate toothbrush (on R side).  Increased time to manipulate toothpaste.  Increasing frustration with R UE and decreased coordination, voicing "I can't use that" requiring hand over hand at times.  Will follow acutely.  Updated dc plan to SNF as CIR declined due to assist post rehab.    Recommendations for follow up therapy are one component of a multi-disciplinary discharge planning process, led by the attending physician.  Recommendations may be updated based on patient status, additional functional criteria and insurance authorization.    Follow Up Recommendations  SNF;Supervision/Assistance - 24 hour    Equipment Recommendations  Other (comment) (TBD)    Recommendations for Other Services      Precautions / Restrictions Precautions Precautions: Fall Precaution Comments: expressive and receptive aphasia, right inattention and RUE ataxia Restrictions Weight Bearing Restrictions: No       Mobility Bed Mobility               General bed mobility comments: OOB in recliner upon entry    Transfers Overall transfer level: Needs assistance Equipment used: None Transfers: Sit to/from Stand Sit to Stand: Min assist         General transfer comment: min assist to steady    Balance Overall balance assessment: Needs  assistance Sitting-balance support: No upper extremity supported;Feet supported Sitting balance-Leahy Scale: Good     Standing balance support: No upper extremity supported;During functional activity Standing balance-Leahy Scale: Fair Standing balance comment: min guard to min assist dynamically               High Level Balance Comments: Worked on high level balance including: standing feet apart EO and EC with min guard; pt unable to follow commands for feet together or marching despite demo and tactile cues; forward/sidesteps/backward walking with mod cues and min A at times; turn in circle min A           ADL either performed or assessed with clinical judgement   ADL Overall ADL's : Needs assistance/impaired     Grooming: Minimal assistance;Standing;Brushing hair;Oral care Grooming Details (indicate cue type and reason): increased time to manipulate toothpaste, cueing for sequencing and problem solving as well as appropraite use of items.  Max cueing to utilize R hand.                 Toilet Transfer: Minimal assistance;Ambulation           Functional mobility during ADLs: Minimal assistance;Cueing for safety;Cueing for sequencing General ADL Comments: pt remains limited by R UE functional use, vision/inattention, cogntioin and balance     Vision   Additional Comments: max cueing to locate items on R side, assist for head turn at times   Perception     Praxis      Cognition Arousal/Alertness: Awake/alert Behavior During Therapy: WFL for tasks assessed/performed Overall Cognitive Status: Impaired/Different from baseline  Area of Impairment: Following commands;Safety/judgement;Memory;Problem solving;Attention;Awareness                 Orientation Level: Time Current Attention Level: Sustained Memory: Decreased short-term memory Following Commands: Follows one step commands inconsistently;Follows one step commands with increased  time Safety/Judgement: Decreased awareness of safety;Decreased awareness of deficits Awareness: Emergent Problem Solving: Difficulty sequencing;Requires verbal cues;Requires tactile cues General Comments: pt requiring max cueing to sequence basic ADL tasks, cueing to use items appropraitely (attempting to place toothpaste on comb, used comb backwards).  Max cueing to attend to R visual field and requires assist to turn head at times to locate items on R side.        Exercises Other Exercises Other Exercises: Pt followed commands without gestures for marching in sitting and putting feet together in sitting but unable in standing.  Marching x 10 in chair Other Exercises: 10 mini squats with min guard   Shoulder Instructions       General Comments VSS, son present; therapist demonstrated folding towel, requires min assist to engage in task with hand over hand support of R hand    Pertinent Vitals/ Pain       Pain Assessment: No/denies pain  Home Living                                          Prior Functioning/Environment              Frequency  Min 3X/week        Progress Toward Goals  OT Goals(current goals can now be found in the care plan section)  Progress towards OT goals: Progressing toward goals  Acute Rehab OT Goals Patient Stated Goal: to return to independence OT Goal Formulation: With patient/family  Plan Frequency remains appropriate;Discharge plan needs to be updated    Co-evaluation                 AM-PAC OT "6 Clicks" Daily Activity     Outcome Measure   Help from another person eating meals?: A Little Help from another person taking care of personal grooming?: A Little Help from another person toileting, which includes using toliet, bedpan, or urinal?: A Lot Help from another person bathing (including washing, rinsing, drying)?: A Lot Help from another person to put on and taking off regular upper body clothing?: A  Lot Help from another person to put on and taking off regular lower body clothing?: A Lot 6 Click Score: 14    End of Session Equipment Utilized During Treatment: Gait belt  OT Visit Diagnosis: Other abnormalities of gait and mobility (R26.89);Muscle weakness (generalized) (M62.81);Cognitive communication deficit (R41.841);Other symptoms and signs involving cognitive function;Hemiplegia and hemiparesis Symptoms and signs involving cognitive functions: Cerebral infarction Hemiplegia - Right/Left: Right Hemiplegia - dominant/non-dominant: Dominant Hemiplegia - caused by: Cerebral infarction   Activity Tolerance Patient tolerated treatment well   Patient Left in chair;with call bell/phone within reach;with chair alarm set   Nurse Communication Mobility status        Time: 1031-1050 OT Time Calculation (min): 19 min  Charges: OT General Charges $OT Visit: 1 Visit OT Treatments $Self Care/Home Management : 8-22 mins  Jolaine Artist, OT [place] Pager [phone] Office [phone]   Delight Stare 03/11/2021, 12:07 PM

## 2021-03-11 NOTE — Progress Notes (Signed)
Physical Therapy Treatment Patient Details Name: Lisa Sutton MRN: [id number] DOB: [birth year] Today's Date: 03/11/2021   History of Present Illness 87 y.o. female presents to [facility] hospital 03/06/2021 with aphasia. CTH demonstrates L MCA M2 occlusion. Pt underwent emergent thrombectomy on 03/07/2021. PMH includes HTN, GERD, Hep B, HLD, Obesity, osteoporosis.    PT Comments    Pt making gradual progress but needs cues for R sided attention.  Only requiring min physical assist but mod cues for task and gait.  Worked on standing balance activities with min A.  Continue to progress as able.     Recommendations for follow up therapy are one component of a multi-disciplinary discharge planning process, led by the attending physician.  Recommendations may be updated based on patient status, additional functional criteria and insurance authorization.  Follow Up Recommendations  SNF;Supervision for mobility/OOB (as CIR denied)     Equipment Recommendations  Other (comment) (TBD)    Recommendations for Other Services Rehab consult     Precautions / Restrictions Precautions Precautions: Fall Precaution Comments: expressive and receptive aphasia, right inattention and RUE ataxia     Mobility  Bed Mobility               General bed mobility comments: OOB in recliner upon entry    Transfers Overall transfer level: Needs assistance Equipment used: None Transfers: Sit to/from Stand Sit to Stand: Min assist         General transfer comment: Min A to steady; performed x 4 during session  Ambulation/Gait Ambulation/Gait assistance: Min assist Gait Distance (Feet): 300 Feet Assistive device: 1 person hand held assist Gait Pattern/deviations: Step-through pattern;Decreased stride length     General Gait Details: Pt with less physical assist required today able to ambulate with HHA rather than full arm hooking on therapist waist. No ataxia noted but did scissor gait  occasionally (particularly with turns) - cues to correct.  Cues to attend to objects on Right   Stairs             Wheelchair Mobility    Modified Rankin (Stroke Patients Only) Modified Rankin (Stroke Patients Only) Pre-Morbid Rankin Score: No symptoms Modified Rankin: Moderately severe disability     Balance Overall balance assessment: Needs assistance Sitting-balance support: No upper extremity supported;Feet supported Sitting balance-Leahy Scale: Good     Standing balance support: Single extremity supported;No upper extremity supported Standing balance-Leahy Scale: Fair Standing balance comment: Could static stand with no physical assist but hands in mid guard position; dynamic needing min guard-min A               High Level Balance Comments: Worked on high level balance including: standing feet apart EO and EC with min guard; pt unable to follow commands for feet together or marching despite demo and tactile cues; forward/sidesteps/backward walking with mod cues and min A at times; turn in circle min A            Cognition Arousal/Alertness: Awake/alert Behavior During Therapy: WFL for tasks assessed/performed Overall Cognitive Status: Impaired/Different from baseline Area of Impairment: Following commands;Safety/judgement;Memory;Problem solving;Attention                 Orientation Level: Time Current Attention Level: Sustained Memory: Decreased short-term memory Following Commands: Follows one step commands inconsistently;Follows one step commands with increased time Safety/Judgement: Decreased awareness of safety;Decreased awareness of deficits Awareness: Emergent Problem Solving: Difficulty sequencing;Requires verbal cues;Requires tactile cues General Comments: Pt with automatic speech grossly 80% accurate with pt  able to follow simple commands with 70% accuracy with increased accuracy when directed toward left visual field vs right.  Also,  improved accuracy when sitting vs standing - likely due to increased focus on balance when standing      Exercises Other Exercises Other Exercises: Pt followed commands without gestures for marching in sitting and putting feet together in sitting but unable in standing.  Marching x 10 in chair Other Exercises: 10 mini squats with min guard    General Comments General comments (skin integrity, edema, etc.): VSS; Son present      Pertinent Vitals/Pain Pain Assessment: No/denies pain    Home Living                      Prior Function            PT Goals (current goals can now be found in the care plan section) Progress towards PT goals: Progressing toward goals    Frequency    Min 4X/week      PT Plan Current plan remains appropriate    Co-evaluation              AM-PAC PT "6 Clicks" Mobility   Outcome Measure  Help needed turning from your back to your side while in a flat bed without using bedrails?: A Little Help needed moving from lying on your back to sitting on the side of a flat bed without using bedrails?: A Little Help needed moving to and from a bed to a chair (including a wheelchair)?: A Little Help needed standing up from a chair using your arms (e.g., wheelchair or bedside chair)?: A Little Help needed to walk in hospital room?: A Lot Help needed climbing 3-5 steps with a railing? : A Lot 6 Click Score: 16    End of Session Equipment Utilized During Treatment: Gait belt Activity Tolerance: Patient tolerated treatment well Patient left: in chair;with call bell/phone within reach;with family/visitor present (alarm not on b/c OT going in) Nurse Communication: Mobility status PT Visit Diagnosis: Other abnormalities of gait and mobility (R26.89);Unsteadiness on feet (R26.81);Muscle weakness (generalized) (M62.81);Other symptoms and signs involving the nervous system (R29.898)     Time: 9924-2683 PT Time Calculation (min) (ACUTE ONLY): 23  min  Charges:  $Gait Training: 8-22 mins $Neuromuscular Re-education: 8-22 mins                     Abran Richard, PT Acute Rehab Services Pager [phone] [facility] [place] 03/11/2021, 11:18 AM

## 2021-03-12 DIAGNOSIS — I63512 Cerebral infarction due to unspecified occlusion or stenosis of left middle cerebral artery: Secondary | ICD-10-CM | POA: Diagnosis not present

## 2021-03-12 LAB — CBC
HCT: 38.9 % (ref 36.0–46.0)
Hemoglobin: 12.8 g/dL (ref 12.0–15.0)
MCH: 30.1 pg (ref 26.0–34.0)
MCHC: 32.9 g/dL (ref 30.0–36.0)
MCV: 91.5 fL (ref 80.0–100.0)
Platelets: 270 10*3/uL (ref 150–400)
RBC: 4.25 MIL/uL (ref 3.87–5.11)
RDW: 12.5 % (ref 11.5–15.5)
WBC: 8.6 10*3/uL (ref 4.0–10.5)
nRBC: 0 % (ref 0.0–0.2)

## 2021-03-12 LAB — BASIC METABOLIC PANEL
Anion gap: 10 (ref 5–15)
BUN: 29 mg/dL — ABNORMAL HIGH (ref 8–23)
CO2: 19 mmol/L — ABNORMAL LOW (ref 22–32)
Calcium: 8.5 mg/dL — ABNORMAL LOW (ref 8.9–10.3)
Chloride: 111 mmol/L (ref 98–111)
Creatinine, Ser: 1.99 mg/dL — ABNORMAL HIGH (ref 0.44–1.00)
GFR, Estimated: 24 mL/min — ABNORMAL LOW (ref >60)
Glucose, Bld: 138 mg/dL — ABNORMAL HIGH (ref 70–99)
Potassium: 4.5 mmol/L (ref 3.5–5.1)
Sodium: 140 mmol/L (ref 135–145)

## 2021-03-12 LAB — GLUCOSE, CAPILLARY
Glucose-Capillary: 179 mg/dL — ABNORMAL HIGH (ref 70–99)
Glucose-Capillary: 107 mg/dL — ABNORMAL HIGH (ref 70–99)
Glucose-Capillary: 138 mg/dL — ABNORMAL HIGH (ref 70–99)
Glucose-Capillary: 105 mg/dL — ABNORMAL HIGH (ref 70–99)

## 2021-03-12 MED ORDER — INSULIN ASPART 100 UNIT/ML IJ SOLN: 0.0000 [IU] | Freq: Three times a day (TID) | INTRAMUSCULAR | 11 refills | Status: DC

## 2021-03-12 MED ORDER — ACETAMINOPHEN 325 MG PO TABS: 650.0000 mg | ORAL_TABLET | ORAL | 0 refills | Status: DC | PRN

## 2021-03-12 MED ORDER — IRBESARTAN 150 MG PO TABS
150.0000 mg | ORAL_TABLET | Freq: Every day | ORAL | 0 refills | Status: DC
Start: 2021-03-13 — End: 2021-10-28

## 2021-03-12 MED ORDER — SENNOSIDES-DOCUSATE SODIUM 8.6-50 MG PO TABS: 1.0000 | ORAL_TABLET | Freq: Every evening | ORAL | 0 refills | Status: DC | PRN

## 2021-03-12 MED ORDER — ASPIRIN 325 MG PO TBEC: 325.0000 mg | DELAYED_RELEASE_TABLET | Freq: Every day | ORAL | 0 refills | Status: DC

## 2021-03-12 MED ORDER — CLOPIDOGREL BISULFATE 75 MG PO TABS: 75.0000 mg | ORAL_TABLET | Freq: Every day | ORAL | 0 refills | Status: DC

## 2021-03-12 MED ORDER — PANTOPRAZOLE SODIUM 40 MG PO TBEC: 40.0000 mg | DELAYED_RELEASE_TABLET | Freq: Every day | ORAL | 0 refills | Status: DC

## 2021-03-12 MED ORDER — ATENOLOL 25 MG PO TABS: 25.0000 mg | ORAL_TABLET | Freq: Every day | ORAL | 0 refills | Status: DC

## 2021-03-12 NOTE — Progress Notes (Addendum)
STROKE TEAM PROGRESS NOTE    INTERVAL HISTORY Patient once again examined sitting comfortably in chair   No acute events overnight, hemodynamically stable with stable neuro exam.  Expressive and receptive aphasia noted.  Patient is able to follow simple commands but requires repeated instructions and demonstration at times. She is aware of her aphasia and expresses frustration at difficulties with word finding.    She is on DAPT with ASA and Plavix for secondary prevention at this time.  PT/OT recommending short SNF stay for rehabilitation.   Pt for discharge to [facility].   Patient has no questions or concerns at this time.  Vitals:   03/12/21 0805 03/12/21 1000 03/12/21 1100 03/12/21 1200  BP:   (!) 142/71 (!) 123/105  Pulse:  63 (!) 56 (!) 57  Resp:  12 17 18   Temp: 97.9 F (36.6 C)  (!) 97.2 F (36.2 C)   TempSrc: Oral  Oral   SpO2:  98% 99% 97%  Weight:      Height:       CBC:  Recent Labs  Lab 03/06/21 2231 03/07/21 0412 03/11/21 0229 03/12/21 0150  WBC 9.2   < > 8.2 8.6  NEUTROABS 5.0  --   --   --   HGB 13.4   < > 12.0 12.8  HCT 39.7   < > 35.2* 38.9  MCV 90.4   < > 89.3 91.5  PLT 275   < > 245 270   < > = values in this interval not displayed.    Basic Metabolic Panel:  Recent Labs  Lab 03/11/21 0229 03/12/21 0150  NA 133* 140  K 4.2 4.5  CL 108 111  CO2 17* 19*  GLUCOSE 125* 138*  BUN 23 29*  CREATININE 1.52* 1.99*  CALCIUM 8.0* 8.5*    Lipid Panel:  Recent Labs  Lab 03/11/21 0229  CHOL 219*  TRIG 290*  HDL 38*  CHOLHDL 5.8  VLDL 58*  LDLCALC 123*    HgbA1c:  Recent Labs  Lab 03/07/21 0624  HGBA1C 6.2*    Urine Drug Screen:  Recent Labs  Lab 03/06/21 2352  LABOPIA NONE DETECTED  COCAINSCRNUR NONE DETECTED  LABBENZ NONE DETECTED  AMPHETMU NONE DETECTED  THCU NONE DETECTED  LABBARB NONE DETECTED     Alcohol Level  Recent Labs  Lab 03/06/21 2231  ETH <10     IMAGING past 24 hours No results  found.  PHYSICAL EXAM  Temp:  [97.2 F (36.2 C)-98.3 F (36.8 C)] 97.2 F (36.2 C) (10/20 1100) Pulse Rate:  [49-64] 57 (10/20 1200) Resp:  [11-20] 18 (10/20 1200) BP: (123-157)/(66-105) 123/105 (10/20 1200) SpO2:  [92 %-99 %] 97 % (10/20 1200)  General - Well nourished, well developed, in no apparent distress.  Cardiovascular - Regular rhythm and rate, sinus bradycardia  Mental Status -  Awake and alert, oriented to person and place but disoriented to time.  Able to follow simple commands but requires repetition of command and demonstration at times.  Cranial Nerves II - XII - V - Facial sensation diminished on right  VII - Facial movement intact bilaterally. VIII - Hearing & vestibular intact bilaterally. XII - Tongue protrusion intact.  Sensation: Diminished on right hemibody  Motor Strength - RUE 4/5 strength, LUE 5/5 strength, BLE 5/5 strength  Motor Tone - Muscle tone was assessed at the neck and appendages and was normal  No pronator drift noted in BUE, no drift noted in  BLE  Coordination - unable to assess  Gait and Station - deferred.   ASSESSMENT/PLAN Ms. FOYE HAGGART is a 87 y.o. female with history of  HTN, GERD, Hep B, HLD, Obesity, osteoporosis who initially presented to [facility] ED with aphasia. Was evaluated by teleneurology, LKW of 1815, did not seem like she was eating right at 1830 and difficulty holding a fork. Baseline mRS is a 0. CTH with ASPECTS of 10, CTA with L MCA M2 occlusion with a 54cc penumbra and a 4cc core. She was outside thrombolytics window and was emergently transferred to [facility] for thrombectomy.  Stroke: Left MCA stroke due to left M2 inferior division occlusion s/p IR with TICI 2c, etiology likely large vessel disease Code Stroke CT head. No acute intracranial abnormality. ASPECTS is 10. CTA head & neck : Proximal left MCA M2 segment occlusion with 4 mL left MCA territory core infarct and 54 mL ischemic penumbra. Cerebral  angio left M2 occlusion with TICI 2c reperfusion Post IR CT : Concern for [facility] with small volume MRI brain showed left MCA patchy scattered infarct moderate size, but no SAH seen on SWI.  MRA showed continued left M2 occlusion and b/l M2 stenosis.  2D Echo EF 60-65% Bilat LE [place] No evidence of DVT bilaterally LDL 123 HgbA1c 6.2 VTE prophylaxis - SCd's No antithrombotic prior to admission, now on aspirin 325 mg daily and clopidogrel 75 mg daily. 3 months and then ASA alone  Therapy recommendations:  SNF Disposition:  pending  Hypertension Home meds:  atenolol, norvasc, irbesartan Stable Long-term BP goal normotensive  Hyperlipidemia Home meds: Repatha listed but reported as not taking, family reports stopped after one week due leg aches.  statin intolerance has Zetia allergy kidney function not a candidate for fenofibrate currently on Lovaza.   LDL 123, goal < 70 She needs close outpatient follow-up with her PCP for better HLD management.   Diabetes type II Controlled Home meds:  glucotrol prn for "big meal" at home  HgbA1c 6.2, goal < 7.0 CBGs SSI  S/p intubation with mechanical ventilation Intubated pre-procedure and not initially extubated 2/2 concern for developing bleed Extubated 10/15 1635 Tolerating well  AKI Cre 1.46->1.67->1.97->1.78-->1.56--> 1.52 Saline locked MIVF Now on diet, monitor intake   Other Stroke Risk Factors Advanced Age >/= 21   Other Active Problems Anxiety:  Cymbalta daily, Xanax prn on home list (need to sort out her actual use when able) Hypothyroidism: continue home synthroid    Hospital day # 5  ATTENDING NOTE: I reviewed above note and agree with the assessment and plan. Pt was seen and examined.   No acute event overnight, neuro stable.  Continue current medication, pending SNF tomorrow.  For detailed assessment and plan, please refer to above as I have made changes wherever appropriate.   Rosalin Hawking, MD PhD Stroke  Neurology 03/12/2021 8:24 PM      To contact Stroke Continuity provider, please refer to [email or web address] After hours, contact General Neurology

## 2021-03-12 NOTE — Plan of Care (Signed)
  Problem: Education: Goal: Knowledge of General Education information will improve Description Including pain rating scale, medication(s)/side effects and non-pharmacologic comfort measures Outcome: Progressing   Problem: Health Behavior/Discharge Planning: Goal: Ability to manage health-related needs will improve Outcome: Progressing   

## 2021-03-12 NOTE — Progress Notes (Signed)
Physical Therapy Treatment Patient Details Name: Lisa Sutton MRN: [id number] DOB: 12-Nov-1933 Today's Date: 03/12/2021   History of Present Illness 87 y.o. female presents to [facility] hospital 03/06/2021 with aphasia. CTH demonstrates L MCA M2 occlusion. Pt underwent emergent thrombectomy on 03/07/2021. PMH includes HTN, GERD, Hep B, HLD, Obesity, osteoporosis.    PT Comments    Pt received in recliner. She required min assist transfers, and min/HHA ambulation 200'. She continues to present with R inattention. Fair standing balance. LOB x 1 during gait with min assist to recover. Pt returned to recliner at end of session.    Recommendations for follow up therapy are one component of a multi-disciplinary discharge planning process, led by the attending physician.  Recommendations may be updated based on patient status, additional functional criteria and insurance authorization.  Follow Up Recommendations  SNF;Supervision for mobility/OOB     Equipment Recommendations  Other (comment) (defer to next venue)    Recommendations for Other Services       Precautions / Restrictions Precautions Precautions: Fall Precaution Comments: expressive and receptive aphasia, right inattention and RUE ataxia     Mobility  Bed Mobility               General bed mobility comments: OOB in recliner upon entry    Transfers Overall transfer level: Needs assistance Equipment used: None Transfers: Sit to/from Stand Sit to Stand: Min assist         General transfer comment: assist to stabilize balance  Ambulation/Gait Ambulation/Gait assistance: Min assist Gait Distance (Feet): 200 Feet Assistive device: 1 person hand held assist Gait Pattern/deviations: Step-through pattern;Decreased stride length;Scissoring Gait velocity: decreased Gait velocity interpretation: <1.31 ft/sec, indicative of household ambulator General Gait Details: occasional scissoring. LOB x 1 with min assist to  maintain balance   Stairs             Wheelchair Mobility    Modified Rankin (Stroke Patients Only) Modified Rankin (Stroke Patients Only) Pre-Morbid Rankin Score: No symptoms Modified Rankin: Moderately severe disability     Balance Overall balance assessment: Needs assistance Sitting-balance support: No upper extremity supported;Feet supported Sitting balance-Leahy Scale: Good     Standing balance support: No upper extremity supported;During functional activity;Single extremity supported Standing balance-Leahy Scale: Fair Standing balance comment: reliant on external assist from therapist                            Cognition Arousal/Alertness: Awake/alert Behavior During Therapy: Impulsive Overall Cognitive Status: Impaired/Different from baseline Area of Impairment: Following commands;Safety/judgement;Memory;Problem solving;Attention;Awareness                   Current Attention Level: Sustained Memory: Decreased short-term memory Following Commands: Follows one step commands inconsistently;Follows one step commands with increased time Safety/Judgement: Decreased awareness of safety;Decreased awareness of deficits Awareness: Emergent Problem Solving: Difficulty sequencing;Requires verbal cues;Requires tactile cues General Comments: max cues to attend to R side      Exercises      General Comments General comments (skin integrity, edema, etc.): VSS on RA. Son present in room.      Pertinent Vitals/Pain Pain Assessment: No/denies pain    Home Living                      Prior Function            PT Goals (current goals can now be found in the care plan section) Acute Rehab  PT Goals Patient Stated Goal: to return to independence Progress towards PT goals: Progressing toward goals    Frequency    Min 4X/week      PT Plan Current plan remains appropriate    Co-evaluation              AM-PAC PT "6 Clicks"  Mobility   Outcome Measure  Help needed turning from your back to your side while in a flat bed without using bedrails?: A Little Help needed moving from lying on your back to sitting on the side of a flat bed without using bedrails?: A Little Help needed moving to and from a bed to a chair (including a wheelchair)?: A Little Help needed standing up from a chair using your arms (e.g., wheelchair or bedside chair)?: A Little Help needed to walk in hospital room?: A Little Help needed climbing 3-5 steps with a railing? : A Lot 6 Click Score: 17    End of Session Equipment Utilized During Treatment: Gait belt Activity Tolerance: Patient tolerated treatment well Patient left: in chair;with call bell/phone within reach;with chair alarm set;with family/visitor present Nurse Communication: Mobility status PT Visit Diagnosis: Other abnormalities of gait and mobility (R26.89);Unsteadiness on feet (R26.81);Muscle weakness (generalized) (M62.81);Other symptoms and signs involving the nervous system (R29.898)     Time: 6808-8110 PT Time Calculation (min) (ACUTE ONLY): 13 min  Charges:  $Gait Training: 8-22 mins                     Lorrin Goodell, PT  Office # [phone] Pager [phone]    Lorriane Shire 03/12/2021, 12:02 PM

## 2021-03-12 NOTE — TOC Progression Note (Addendum)
Transition of Care [facility]) - Progression Note    Patient Details  Name: Lisa Sutton MRN: [id number] Date of Birth: 09/08/1933  Transition of Care [facility]) CM/SW Contact  [name], LCSW Phone Number: 03/12/2021, 10:15 AM  Clinical Narrative:  Submitted insurance authorization via [place] [employer] to be effective Friday, 03/13/21, Olene Floss [id number]. Request is currently pending and CSW will continue to follow. Patient will discharge to [place].  1:37 pm: Checked [facility] access and patient approved for ST rehab at [facility]. Authorization effective 10/21 - 10/25; next review date 10/25 and Plan Auth ID [id number]. CSW will make contact with admissions director and provide auth information.   3:01 pm: [place] [phone]) and message left for admissions director Belenda Cruise regarding tomorrow's discharge to their facility. Provided her with insurance authorization information.   Talked with receiving [place] regarding patient, tomorrow's discharge, calls to admissions director and voice mails left.   Expected Discharge Plan: [place]    Expected Discharge Plan and Services Expected Discharge Plan: [place] In-house Referral: Clinical Social Work   Post Acute Care Choice: [place] Living arrangements for the past 2 months: Single Family Home                                       Social Determinants of Health (SDOH) Interventions    Readmission Risk Interventions No flowsheet data found.

## 2021-03-12 NOTE — Progress Notes (Signed)
SLP Cancellation Note  Patient Details Name: DANITZA SCHOENFELDT MRN: [id number] DOB: 05-18-1934   Cancelled treatment:       Reason Eval/Treat Not Completed: Patient at procedure or test/unavailable; pt off unit/being transferred to 3W when SLP attempted session; will continue to f/u with ST services in acute setting.   Elvina Sidle, M.S., CCC-SLP 03/12/2021, 2:56 PM

## 2021-03-13 ENCOUNTER — Telehealth: Payer: Self-pay | Admitting: Student

## 2021-03-13 ENCOUNTER — Other Ambulatory Visit: Payer: Self-pay | Admitting: Student

## 2021-03-13 DIAGNOSIS — I63512 Cerebral infarction due to unspecified occlusion or stenosis of left middle cerebral artery: Secondary | ICD-10-CM | POA: Diagnosis not present

## 2021-03-13 DIAGNOSIS — E782 Mixed hyperlipidemia: Secondary | ICD-10-CM | POA: Diagnosis not present

## 2021-03-13 DIAGNOSIS — I10 Essential (primary) hypertension: Secondary | ICD-10-CM | POA: Diagnosis not present

## 2021-03-13 LAB — GLUCOSE, CAPILLARY: Glucose-Capillary: 147 mg/dL — ABNORMAL HIGH (ref 70–99)

## 2021-03-13 NOTE — Care Management Important Message (Signed)
Important Message  Patient Details  Name: Lisa Sutton MRN: [id number] Date of Birth: [birth year]   Medicare Important Message Given:  Yes Patient left prior t to IM delivery.  IM will be mailed to the patient home address.     [name] 03/13/2021, 3:08 PM

## 2021-03-13 NOTE — Discharge Summary (Addendum)
Stroke Discharge Summary  Patient ID: Lisa Sutton   MRN: [id number]      DOB: 12-05-1933  Date of Admission: 03/06/2021 Date of Discharge: 03/13/2021  Attending Physician:  Stroke, Md, MD, Stroke MD Consultant(s):    pulmonary/intensive care Walker Shadow Patient's PCP:  Wannetta Sender, FNP  DISCHARGE DIAGNOSIS:   Left MCA stroke due to left M2 inferior division occlusion s/p IR  Active Problems:   Primary hypertension   Intracranial stenosis   HLD   DM   AKI   hypothyriodism   Allergies as of 03/13/2021       Reactions   Statins Other (See Comments)   'discomfort, aching everywhere' Tolerates to livalo    Zetia [ezetimibe] Other (See Comments)   Leg pain   Zocor [simvastatin] Other (See Comments)   Leg pain        Medication List     STOP taking these medications    Accu-Chek Aviva Plus test strip Generic drug: glucose blood   Accu-Chek Softclix Lancets lancets   acetaminophen 500 MG tablet Commonly known as: TYLENOL   Alpha-Lipoic Acid 600 MG Caps   cephALEXin 500 MG capsule Commonly known as: KEFLEX   famotidine 20 MG tablet Commonly known as: Pepcid   glipiZIDE 5 MG tablet Commonly known as: GLUCOTROL   hydrocortisone 2.5 % rectal cream Commonly known as: ANUSOL-HC   ipratropium 0.06 % nasal spray Commonly known as: ATROVENT   Lokelma 10 g Pack packet Generic drug: sodium zirconium cyclosilicate   PROBIOTIC DAILY PO   psyllium 58.6 % powder Commonly known as: Metamucil Smooth Texture   Repatha 140 MG/ML Sosy Generic drug: Evolocumab   Vitamin D (Ergocalciferol) 1.25 MG (50000 UNIT) Caps capsule Commonly known as: DRISDOL       TAKE these medications    ALPRAZolam 0.25 MG tablet Commonly known as: XANAX 1 tablet as needed What changed: Another medication with the same name was removed. Continue taking this medication, and follow the directions you see here.   amLODipine 10 MG tablet Commonly known as:  NORVASC Take 10 mg by mouth daily. What changed: Another medication with the same name was removed. Continue taking this medication, and follow the directions you see here.   aspirin 325 MG EC tablet Take 1 tablet (325 mg total) by mouth daily.   atenolol 25 MG tablet Commonly known as: TENORMIN Take 1 tablet (25 mg total) by mouth daily. What changed:  medication strength how much to take   b complex vitamins tablet Take 1 tablet by mouth daily.   Biotin 2500 MCG Caps Take by mouth daily.   clopidogrel 75 MG tablet Commonly known as: PLAVIX Take 1 tablet (75 mg total) by mouth daily.   DULoxetine 30 MG capsule Commonly known as: CYMBALTA Take 1 capsule (30 mg total) by mouth daily. What changed: when to take this   irbesartan 150 MG tablet Commonly known as: AVAPRO Take 1 tablet (150 mg total) by mouth daily. What changed:  how much to take Another medication with the same name was removed. Continue taking this medication, and follow the directions you see here.   levothyroxine 125 MCG tablet Commonly known as: SYNTHROID Take 1 tablet (125 mcg total) by mouth daily before breakfast. What changed: Another medication with the same name was removed. Continue taking this medication, and follow the directions you see here.   pantoprazole 40 MG tablet Commonly known as: PROTONIX Take 1 tablet (40 mg  total) by mouth daily before lunch.   rosuvastatin 5 MG tablet Commonly known as: CRESTOR TAKE 1 TABLET BY MOUTH  DAILY What changed: when to take this   Vitamin B12 1000 MCG Tbcr 1 tablet   Vitamin D-3 25 MCG (1000 UT) Caps 2 capsule        LABORATORY STUDIES CBC    Component Value Date/Time   WBC 8.6 03/12/2021 0150   RBC 4.25 03/12/2021 0150   HGB 12.8 03/12/2021 0150   HGB 14.7 07/16/2019 1002   HCT 38.9 03/12/2021 0150   HCT 42.5 07/16/2019 1002   PLT 270 03/12/2021 0150   PLT 203 07/16/2019 1002   MCV 91.5 03/12/2021 0150   MCV 91 07/16/2019 1002    MCH 30.1 03/12/2021 0150   MCHC 32.9 03/12/2021 0150   RDW 12.5 03/12/2021 0150   RDW 13.3 07/16/2019 1002   LYMPHSABS 3.0 03/06/2021 2231   LYMPHSABS 2.6 07/16/2019 1002   MONOABS 1.0 03/06/2021 2231   EOSABS 0.0 03/06/2021 2231   EOSABS 0.2 07/16/2019 1002   BASOSABS 0.1 03/06/2021 2231   BASOSABS 0.1 07/16/2019 1002   CMP    Component Value Date/Time   NA 140 03/12/2021 0150   NA 137 07/16/2019 1002   K 4.5 03/12/2021 0150   CL 111 03/12/2021 0150   CO2 19 (L) 03/12/2021 0150   GLUCOSE 138 (H) 03/12/2021 0150   BUN 29 (H) 03/12/2021 0150   BUN 19 07/16/2019 1002   CREATININE 1.99 (H) 03/12/2021 0150   CALCIUM 8.5 (L) 03/12/2021 0150   PROT 6.9 03/06/2021 2231   PROT 6.1 07/16/2019 1002   ALBUMIN 3.7 03/06/2021 2231   ALBUMIN 3.5 (L) 07/16/2019 1002   AST 19 03/06/2021 2231   ALT 13 03/06/2021 2231   ALKPHOS 66 03/06/2021 2231   BILITOT 0.3 03/06/2021 2231   BILITOT 0.2 07/16/2019 1002   GFRNONAA 24 (L) 03/12/2021 0150   GFRAA 38 (L) 07/16/2019 1002   COAGS Lab Results  Component Value Date   INR 0.9 03/06/2021   INR 0.95 09/03/2015   Lipid Panel    Component Value Date/Time   CHOL 219 (H) 03/11/2021 0229   CHOL 343 (H) 07/16/2019 1002   CHOL 147 04/16/2013 0820   TRIG 290 (H) 03/11/2021 0229   TRIG 368 (H) 04/21/2016 0812   TRIG 172 (H) 04/16/2013 0820   HDL 38 (L) 03/11/2021 0229   HDL 54 07/16/2019 1002   HDL 46 04/21/2016 0812   HDL 53 04/16/2013 0820   CHOLHDL 5.8 03/11/2021 0229   VLDL 58 (H) 03/11/2021 0229   LDLCALC 123 (H) 03/11/2021 0229   LDLCALC 205 (H) 07/16/2019 1002   LDLCALC 60 04/16/2013 0820   HgbA1C  Lab Results  Component Value Date   HGBA1C 6.2 (H) 03/07/2021   Urinalysis    Component Value Date/Time   COLORURINE STRAW (A) 03/06/2021 2352   APPEARANCEUR CLEAR 03/06/2021 2352   APPEARANCEUR Clear 07/16/2019 0825   LABSPEC 1.016 03/06/2021 2352   PHURINE 6.0 03/06/2021 2352   GLUCOSEU 50 (A) 03/06/2021 2352   HGBUR  SMALL (A) 03/06/2021 2352   BILIRUBINUR NEGATIVE 03/06/2021 2352   BILIRUBINUR Negative 07/16/2019 0825   KETONESUR NEGATIVE 03/06/2021 2352   PROTEINUR >=300 (A) 03/06/2021 2352   UROBILINOGEN 0.2 11/26/2014 1120   NITRITE NEGATIVE 03/06/2021 2352   LEUKOCYTESUR NEGATIVE 03/06/2021 2352   Urine Drug Screen     Component Value Date/Time   LABOPIA NONE DETECTED 03/06/2021 2352   COCAINSCRNUR NONE  DETECTED 03/06/2021 2352   LABBENZ NONE DETECTED 03/06/2021 2352   AMPHETMU NONE DETECTED 03/06/2021 2352   THCU NONE DETECTED 03/06/2021 2352   LABBARB NONE DETECTED 03/06/2021 2352    Alcohol Level    Component Value Date/Time   ETH <10 03/06/2021 2231     SIGNIFICANT DIAGNOSTIC STUDIES CT HEAD WO CONTRAST (5MM)  Result Date: 03/07/2021 CLINICAL DATA:  Follow-up intracranial hemorrhage EXAM: CT HEAD WITHOUT CONTRAST TECHNIQUE: Contiguous axial images were obtained from the base of the skull through the vertex without intravenous contrast. COMPARISON:  CT head obtained 1 day prior, intra procedural CT obtained earlier the same morning FINDINGS: Brain: There is hyperdensity overlying the left parietal and occipital lobes not seen on the prior study. This hyperdensity is not within the infarct territory seen on the prior CT perfusion. There is also swelling of the gyri in the frontal lobe with ill-defined overlying hyperdensity. Mild asymmetric hyperdensity of the left basal ganglia may reflect contrast staining. There is no acute extra-axial fluid collection. Hypodensity throughout the subcortical and periventricular white matter is not significantly changed, again likely reflecting chronic white matter microangiopathy and remote infarcts. The ventricles are stable in size. There is no mass lesion. There is no midline shift. Vascular: There is calcification of the bilateral cavernous ICAs. Skull: Normal. Negative for fracture or focal lesion. Sinuses/Orbits: No acute finding. Other: None.  IMPRESSION: 1. Hyperdensity overlying the left frontal, parietal, and occipital lobes not in the location of the infarct seen on the prior CT perfusion raises concern for subarachnoid hemorrhage. The appearance is similar to the findings on the intra procedural CT. 2. Asymmetric hyperdensity in the left basal ganglia may reflect contrast staining. Critical Value/emergent results were called by telephone at the time of interpretation on 03/07/2021 at 11:02 am to provider Dr Saunders Revel verbally acknowledged these results. Electronically Signed   By: Valetta Mole M.D.   On: 03/07/2021 11:03   CT Head Wo Contrast  Result Date: 03/02/2021 CLINICAL DATA:  Cerebral hemorrhage suspected EXAM: CT HEAD WITHOUT CONTRAST TECHNIQUE: Contiguous axial images were obtained from the base of the skull through the vertex without intravenous contrast. COMPARISON:  06/05/2019 CT orbits, 04/28/2015 CT head FINDINGS: Brain: No evidence of acute infarction, hemorrhage, hydrocephalus, extra-axial collection or mass lesion/mass effect. Periventricular white matter changes, likely the sequela of chronic small vessel ischemic disease. Chronic infarcts in the bilateral basal ganglia. Vascular: No hyperdense vessel. Atherosclerotic calcifications in the intracranial carotid and vertebral arteries. Skull: Normal. Negative for fracture or focal lesion. Sinuses/Orbits: No acute finding. Status post bilateral lens replacements. Other: The mastoids are well aerated. IMPRESSION: No acute intracranial process. Electronically Signed   By: Merilyn Baba M.D.   On: 03/02/2021 19:50   MR ANGIO HEAD WO CONTRAST  Result Date: 03/08/2021 CLINICAL DATA:  Stroke follow-up EXAM: MRI HEAD WITHOUT CONTRAST MRA HEAD WITHOUT CONTRAST TECHNIQUE: Multiplanar, multi-echo pulse sequences of the brain and surrounding structures were acquired without intravenous contrast. Angiographic images of the Circle of Willis were acquired using MRA technique without intravenous  contrast. COMPARISON:  Yesterday FINDINGS: MRI HEAD FINDINGS Brain: Extensive patchy cortical and white matter infarction in the left cerebral hemisphere, greatest in the superior division region centered at the frontal parietal junction. Few acute cortical infarcts in the right parietooccipital cortex. Minimal if any petechial hemorrhage in the left parietal region. No hematoma, hydrocephalus, shift. Confluent chronic small vessel ischemia in the deep white matter and to a lesser extent in the pons. Chronic perforator infarcts in  the right corona radiata. Vascular: See below Skull and upper cervical spine: Normal marrow signal Sinuses/Orbits: Bilateral cataract resection MRA HEAD FINDINGS Anterior circulation: The carotid arteries are atheromatous but diffusely patent. No branch occlusion, beading, or aneurysm. Severe left M2 branch stenosis with flow gap, also highlighted on prior CTA. Intracranial atherosclerosis is extensive and advanced. Right M2 branch is also highly stenotic. Posterior circulation: Vertebral and basilar arteries are diffusely patent. Diffuse atheromatous irregularity posterior cerebral arteries high-grade narrowing the right PCA bifurcation IMPRESSION: Brain MRI: 1. Patchy acute infarcts clustered in the superior division left MCA territory but also seen elsewhere in the left MCA territory and to a limited extent the right parietal cortex. 2. Background of advanced chronic small vessel disease. Intracranial MRA: Severe and generalized intracranial atherosclerosis with high-grade bilateral M2 segment narrowings. No progression from CTA yesterday. Electronically Signed   By: Jorje Guild M.D.   On: 03/08/2021 05:43   MR BRAIN WO CONTRAST  Result Date: 03/08/2021 CLINICAL DATA:  Stroke follow-up EXAM: MRI HEAD WITHOUT CONTRAST MRA HEAD WITHOUT CONTRAST TECHNIQUE: Multiplanar, multi-echo pulse sequences of the brain and surrounding structures were acquired without intravenous contrast.  Angiographic images of the Circle of Willis were acquired using MRA technique without intravenous contrast. COMPARISON:  Yesterday FINDINGS: MRI HEAD FINDINGS Brain: Extensive patchy cortical and white matter infarction in the left cerebral hemisphere, greatest in the superior division region centered at the frontal parietal junction. Few acute cortical infarcts in the right parietooccipital cortex. Minimal if any petechial hemorrhage in the left parietal region. No hematoma, hydrocephalus, shift. Confluent chronic small vessel ischemia in the deep white matter and to a lesser extent in the pons. Chronic perforator infarcts in the right corona radiata. Vascular: See below Skull and upper cervical spine: Normal marrow signal Sinuses/Orbits: Bilateral cataract resection MRA HEAD FINDINGS Anterior circulation: The carotid arteries are atheromatous but diffusely patent. No branch occlusion, beading, or aneurysm. Severe left M2 branch stenosis with flow gap, also highlighted on prior CTA. Intracranial atherosclerosis is extensive and advanced. Right M2 branch is also highly stenotic. Posterior circulation: Vertebral and basilar arteries are diffusely patent. Diffuse atheromatous irregularity posterior cerebral arteries high-grade narrowing the right PCA bifurcation IMPRESSION: Brain MRI: 1. Patchy acute infarcts clustered in the superior division left MCA territory but also seen elsewhere in the left MCA territory and to a limited extent the right parietal cortex. 2. Background of advanced chronic small vessel disease. Intracranial MRA: Severe and generalized intracranial atherosclerosis with high-grade bilateral M2 segment narrowings. No progression from CTA yesterday. Electronically Signed   By: Jorje Guild M.D.   On: 03/08/2021 05:43   IR CT Head Ltd  Result Date: 03/07/2021 INDICATION: 87 year old female presents with acute stroke symptoms, corresponding to left M2 inferior division emergent large vessel  occlusion. She presents for angiogram and mechanical thrombectomy EXAM: ULTRASOUND-GUIDED ACCESS RIGHT COMMON FEMORAL ARTERY CERVICAL AND CEREBRAL ANGIOGRAM MECHANICAL THROMBECTOMY LEFT MCA BRANCHES ANGIO-SEAL FOR HEMOSTASIS COMPARISON:  CT imaging of the same day MEDICATIONS: 650 mg OG.  Plavix Bolus of kangrelor, with the drip stopped secondary to intracranial hemorrhage ANESTHESIA/SEDATION: The anesthesia team was present to provide general endotracheal tube anesthesia and for patient monitoring during the procedure. Intubation was performed in room 2/biplane room. Left radial arterial line was performed by the anesthesia team. Interventional neuro radiology nursing staff was also present. CONTRAST:  86 cc FLUOROSCOPY TIME:  Fluoroscopy Time: 26 minutes 54 seconds (1,889 mGy). COMPLICATIONS: SIR LEVEL B - Normal therapy, includes overnight admission for observation.,  Intracranial hemorrhage TECHNIQUE: Informed written consent was obtained from the patient's family after a thorough discussion of the procedural risks, benefits and alternatives. All questions were addressed. Maximal Sterile Barrier Technique was utilized including caps, mask, sterile gowns, sterile gloves, sterile drape, hand hygiene and skin antiseptic. A timeout was performed prior to the initiation of the procedure. FINDINGS: Initial Findings: Left common carotid artery:  Normal course caliber and contour. Left external carotid artery: Patent with antegrade flow. Left internal carotid artery: Tortuosity of the cervical segment. No significant atherosclerotic changes. Vertical and petrous segment patent with normal course caliber contour. Cavernous segment patent. Clinoid segment patent. Antegrade flow of the ophthalmic artery. Ophthalmic segment patent. Terminus patent. Left MCA: M1 segment patent. Occlusion of the M2 segment inferior division. Patency maintained of the superior division. Left ACA: A 1 segment patent. A 2 segment perfuses the  right territory. Angiogram demonstrates diffuse intracranial atherosclerosis involving the imaged ACA and MCA territory. Completion Findings: Left MCA: After 2 passes of mechanical thrombectomy, there is restoration of flow through the occluded M2 segment. Irregularity at the proximal M2 segment, compatible with a native atherosclerotic plaque. Slow flow within the distal cortical vessels of the affected dominant inferior division territory. TICI 2c: Near complete perfusion except for slow flow in a few distal cortical vessels/presence of small distal cortical emboli Flat panel CT demonstrates small volume subarachnoid hemorrhage within the frontal sulci, [place] 3c. There is also some staining of the cortex in the parietal region where there appears to be completed infarct in the territory at risk, parietal territory, compatible with [place] PH1. PROCEDURE: The anesthesia team was present to provide general endotracheal tube anesthesia and for patient monitoring during the procedure. Intubation was performed in negative pressure [place] in neuro IR holding. Interventional neuro radiology nursing staff was also present. Ultrasound survey of the right inguinal region was performed with images stored and sent to PACs. Ultrasound confirmed patency of the right common femoral artery. 11 blade scalpel was used to make a small incision. Blunt dissection was performed with US guidance. A micropuncture needle was used access the right common femoral artery under direct ultrasound visualization. With excellent arterial blood flow returned, an .018 micro wire was passed through the needle, observed to enter the abdominal aorta under fluoroscopy. The needle was removed, and a micropuncture sheath was placed over the wire. The inner dilator and wire were removed, and an 035 wire was advanced under fluoroscopy into the abdominal aorta. The sheath was removed and a 25cm 79F straight vascular sheath was placed. The dilator was  removed and the sheath was flushed. Sheath was attached to pressurized and heparinized saline bag for constant forward flow. A coaxial system was then advanced over the 035 wire. This included a 95cm 087 "Walrus" balloon guide with coaxial 125cm Berenstein diagnostic catheter. This was advanced to the proximal descending thoracic aorta. Wire was then removed. Double flush of the catheter was performed. Catheter was then used to select the left common carotid artery. Angiogram was performed. Using roadmap technique, the catheter was advanced over a standard glide wire into left cervical ICA, with distal position achieved of the balloon guide. The diagnostic catheter and the wire were removed. Formal angiogram was performed. Road map function was used once the occluded vessel was identified. Copious back flush was performed and the balloon catheter was attached to heparinized and pressurized saline bag for forward flow. A second coaxial system was then advanced through the balloon catheter, which included the selected intermediate  catheter, microcatheter, and microwire. In this scenario, the set up included a 132cm CAT-5 intermediate catheter, a Trevo Provue18 microcatheter, and 014 synchro soft wire. This system was advanced through the balloon guide catheter under the road-map function, with adequate back-flush at the rotating hemostatic valve at that back end of the balloon guide. Microcatheter and the intermediate catheter system were advanced through the terminal ICA and MCA to the level of the occlusion. The micro wire was then carefully advanced through the occluded segment. Microcatheter was then manipulated through the occluded segment and the wire was removed with saline drip at the hub. Blood was then aspirated through the hub of the microcatheter, and a gentle contrast injection was performed confirming intraluminal position. A rotating hemostatic valve was then attached to the back end of the  microcatheter, and a pressurized and heparinized saline bag was attached to the catheter. 4 x 40 solitaire device was then selected. Back flush was achieved at the rotating hemostatic valve, and then the device was gently advanced through the microcatheter to the distal end. The retriever was then unsheathed by withdrawing the microcatheter under fluoroscopy. Once the retriever was completely unsheathed, the microcatheter was carefully stripped from the delivery device. Control angiogram was performed from the intermediate catheter. A 3 minute time interval was observed. The balloon at the balloon guide catheter was then inflated under fluoroscopy for proximal flow arrest. Constant aspiration using the proprietary engine was then performed at the intermediate catheter, as the retriever was gently and slowly withdrawn with fluoroscopic observation. Once the retriever was "corked" within the tip of the intermediate catheter, both were removed from the system. Free aspiration was confirmed at the hub of the balloon guide catheter, with free blood return confirmed. The balloon was then deflated, and a control angiogram was performed. Restoration of flow was confirmed, with irregularity at the site of the occlusion of the proximal M2 segment, with slow flow within the distal cortical vessels. We elected to observe a 5 minute interval time frame for reimaging, as there was a suspected native intracranial atherosclerotic plaque at the site and we anticipated a rethrombosis after the initial thrombectomy attempt. After 5 minutes angiogram was performed confirming re-occlusion. Anticipating the need for a rescue stent, an orogastric tube was placed, 650 mg of aspirin was administered, and we initiated an IV P2 y12 inhibitor bolus. In order to attempt restoration of flow through the segment, measured the target artery, as well as identify the best site for stenting, we elected to perform an additional stent retriever  thrombectomy. The microcatheter was passed with the synchro soft wire through the occluded segment into the parietal branches of the inferior division. The micro wire was carefully advanced through the occluded segment. Microcatheter was then manipulated through the occluded segment and the wire was removed with saline drip at the hub. Gentle injection confirmed intraluminal location within the selected branches. 4 x 40 solitaire device was again selected. Back flush was achieved at the rotating hemostatic valve, and then the device was gently advanced through the microcatheter to the distal end. The retriever was then unsheathed by withdrawing the microcatheter under fluoroscopy. Once the retriever was completely unsheathed, angiogram was performed confirming flow through the segment. While we initiated a measurement of the segment, we observed a 3 minute time interval. Upon withdrawal of the stent tree verb ir, significant tension was observed at the interface/interaction of the stent tree verb ir with the artery, such that there was concern that some troubleshooting might  be required in order to remove the stent retriever. A sudden release was observed and the stent retriever was removed from the balloon guide. Repeat angiogram demonstrated restoration of flow, with complete restoration of flow within the territory and slow distal flow within the cortical vessels compatible with TICI 2c: Near complete perfusion except for slow flow in a few distal cortical vessels/presence of small distal cortical emboli. As there was some concern for endothelial injury and narrowing at the site of the atherosclerotic plaque, we elected at this time not to perform a balloon angioplasty or stenting, electing for treatment only with anti-platelet. Balloon guide was withdrawn. Angiogram of the cervical ICA was performed. Balloon guide was then removed. The skin at the puncture site was then cleaned with Chlorhexidine. The 8 French  sheath was removed and an 42F angioseal was deployed. Flat panel CT was performed. Patient tolerated the procedure well and remained hemodynamically stable throughout. No complications were encountered and no significant blood loss encountered. IMPRESSION: Status post ultrasound guided access right common femoral artery for left-sided cervical/cerebral angiogram, mechanical thrombectomy of a presumed in-situ thrombosis of focal atheroscerlotic plaque at the proximal left M2, achieving TICI 2c flow. Angiogram demonstrates diffuse ICAD of the imaged ACA and MCA territory Angio-Seal for hemostasis. Signed, Dulcy Fanny. Dellia Nims, RPVI [facility] Radiology PLAN: The patient will remain intubated, given small volume intraparenchymal hemorrhage ICU status Target systolic blood pressure of 120-140 Right hip straight time 6 hours Frequent neurovascular checks Repeat neurologic imaging with CT and/MRI at the discretion of neurology team Electronically Signed   By: Corrie Mckusick D.O.   On: 03/07/2021 04:23   IR US Guide Vasc Access Right  Result Date: 03/07/2021 INDICATION: 87 year old female presents with acute stroke symptoms, corresponding to left M2 inferior division emergent large vessel occlusion. She presents for angiogram and mechanical thrombectomy EXAM: ULTRASOUND-GUIDED ACCESS RIGHT COMMON FEMORAL ARTERY CERVICAL AND CEREBRAL ANGIOGRAM MECHANICAL THROMBECTOMY LEFT MCA BRANCHES ANGIO-SEAL FOR HEMOSTASIS COMPARISON:  CT imaging of the same day MEDICATIONS: 650 mg OG.  Plavix Bolus of kangrelor, with the drip stopped secondary to intracranial hemorrhage ANESTHESIA/SEDATION: The anesthesia team was present to provide general endotracheal tube anesthesia and for patient monitoring during the procedure. Intubation was performed in room 2/biplane room. Left radial arterial line was performed by the anesthesia team. Interventional neuro radiology nursing staff was also  present. CONTRAST:  86 cc FLUOROSCOPY TIME:  Fluoroscopy Time: 26 minutes 54 seconds (1,889 mGy). COMPLICATIONS: SIR LEVEL B - Normal therapy, includes overnight admission for observation., Intracranial hemorrhage TECHNIQUE: Informed written consent was obtained from the patient's family after a thorough discussion of the procedural risks, benefits and alternatives. All questions were addressed. Maximal Sterile Barrier Technique was utilized including caps, mask, sterile gowns, sterile gloves, sterile drape, hand hygiene and skin antiseptic. A timeout was performed prior to the initiation of the procedure. FINDINGS: Initial Findings: Left common carotid artery:  Normal course caliber and contour. Left external carotid artery: Patent with antegrade flow. Left internal carotid artery: Tortuosity of the cervical segment. No significant atherosclerotic changes. Vertical and petrous segment patent with normal course caliber contour. Cavernous segment patent. Clinoid segment patent. Antegrade flow of the ophthalmic artery. Ophthalmic segment patent. Terminus patent. Left MCA: M1 segment patent. Occlusion of the M2 segment inferior division. Patency maintained of the superior division. Left ACA: A 1 segment patent. A 2 segment perfuses the right territory. Angiogram demonstrates diffuse intracranial atherosclerosis involving the imaged ACA and MCA territory. Completion  Findings: Left MCA: After 2 passes of mechanical thrombectomy, there is restoration of flow through the occluded M2 segment. Irregularity at the proximal M2 segment, compatible with a native atherosclerotic plaque. Slow flow within the distal cortical vessels of the affected dominant inferior division territory. TICI 2c: Near complete perfusion except for slow flow in a few distal cortical vessels/presence of small distal cortical emboli Flat panel CT demonstrates small volume subarachnoid hemorrhage within the frontal sulci, Heidelberg 3c. There is also  some staining of the cortex in the parietal region where there appears to be completed infarct in the territory at risk, parietal territory, compatible with Heidleberg PH1. PROCEDURE: The anesthesia team was present to provide general endotracheal tube anesthesia and for patient monitoring during the procedure. Intubation was performed in negative pressure [place] in neuro IR holding. Interventional neuro radiology nursing staff was also present. Ultrasound survey of the right inguinal region was performed with images stored and sent to PACs. Ultrasound confirmed patency of the right common femoral artery. 11 blade scalpel was used to make a small incision. Blunt dissection was performed with US guidance. A micropuncture needle was used access the right common femoral artery under direct ultrasound visualization. With excellent arterial blood flow returned, an .018 micro wire was passed through the needle, observed to enter the abdominal aorta under fluoroscopy. The needle was removed, and a micropuncture sheath was placed over the wire. The inner dilator and wire were removed, and an 035 wire was advanced under fluoroscopy into the abdominal aorta. The sheath was removed and a 25cm 48F straight vascular sheath was placed. The dilator was removed and the sheath was flushed. Sheath was attached to pressurized and heparinized saline bag for constant forward flow. A coaxial system was then advanced over the 035 wire. This included a 95cm 087 "Walrus" balloon guide with coaxial 125cm Berenstein diagnostic catheter. This was advanced to the proximal descending thoracic aorta. Wire was then removed. Double flush of the catheter was performed. Catheter was then used to select the left common carotid artery. Angiogram was performed. Using roadmap technique, the catheter was advanced over a standard glide wire into left cervical ICA, with distal position achieved of the balloon guide. The diagnostic catheter and the wire were  removed. Formal angiogram was performed. Road map function was used once the occluded vessel was identified. Copious back flush was performed and the balloon catheter was attached to heparinized and pressurized saline bag for forward flow. A second coaxial system was then advanced through the balloon catheter, which included the selected intermediate catheter, microcatheter, and microwire. In this scenario, the set up included a 132cm CAT-5 intermediate catheter, a Trevo Provue18 microcatheter, and 014 synchro soft wire. This system was advanced through the balloon guide catheter under the road-map function, with adequate back-flush at the rotating hemostatic valve at that back end of the balloon guide. Microcatheter and the intermediate catheter system were advanced through the terminal ICA and MCA to the level of the occlusion. The micro wire was then carefully advanced through the occluded segment. Microcatheter was then manipulated through the occluded segment and the wire was removed with saline drip at the hub. Blood was then aspirated through the hub of the microcatheter, and a gentle contrast injection was performed confirming intraluminal position. A rotating hemostatic valve was then attached to the back end of the microcatheter, and a pressurized and heparinized saline bag was attached to the catheter. 4 x 40 solitaire device was then selected. Back flush was achieved at  the rotating hemostatic valve, and then the device was gently advanced through the microcatheter to the distal end. The retriever was then unsheathed by withdrawing the microcatheter under fluoroscopy. Once the retriever was completely unsheathed, the microcatheter was carefully stripped from the delivery device. Control angiogram was performed from the intermediate catheter. A 3 minute time interval was observed. The balloon at the balloon guide catheter was then inflated under fluoroscopy for proximal flow arrest. Constant aspiration  using the proprietary engine was then performed at the intermediate catheter, as the retriever was gently and slowly withdrawn with fluoroscopic observation. Once the retriever was "corked" within the tip of the intermediate catheter, both were removed from the system. Free aspiration was confirmed at the hub of the balloon guide catheter, with free blood return confirmed. The balloon was then deflated, and a control angiogram was performed. Restoration of flow was confirmed, with irregularity at the site of the occlusion of the proximal M2 segment, with slow flow within the distal cortical vessels. We elected to observe a 5 minute interval time frame for reimaging, as there was a suspected native intracranial atherosclerotic plaque at the site and we anticipated a rethrombosis after the initial thrombectomy attempt. After 5 minutes angiogram was performed confirming re-occlusion. Anticipating the need for a rescue stent, an orogastric tube was placed, 650 mg of aspirin was administered, and we initiated an IV P2 y12 inhibitor bolus. In order to attempt restoration of flow through the segment, measured the target artery, as well as identify the best site for stenting, we elected to perform an additional stent retriever thrombectomy. The microcatheter was passed with the synchro soft wire through the occluded segment into the parietal branches of the inferior division. The micro wire was carefully advanced through the occluded segment. Microcatheter was then manipulated through the occluded segment and the wire was removed with saline drip at the hub. Gentle injection confirmed intraluminal location within the selected branches. 4 x 40 solitaire device was again selected. Back flush was achieved at the rotating hemostatic valve, and then the device was gently advanced through the microcatheter to the distal end. The retriever was then unsheathed by withdrawing the microcatheter under fluoroscopy. Once the retriever  was completely unsheathed, angiogram was performed confirming flow through the segment. While we initiated a measurement of the segment, we observed a 3 minute time interval. Upon withdrawal of the stent tree verb ir, significant tension was observed at the interface/interaction of the stent tree verb ir with the artery, such that there was concern that some troubleshooting might be required in order to remove the stent retriever. A sudden release was observed and the stent retriever was removed from the balloon guide. Repeat angiogram demonstrated restoration of flow, with complete restoration of flow within the territory and slow distal flow within the cortical vessels compatible with TICI 2c: Near complete perfusion except for slow flow in a few distal cortical vessels/presence of small distal cortical emboli. As there was some concern for endothelial injury and narrowing at the site of the atherosclerotic plaque, we elected at this time not to perform a balloon angioplasty or stenting, electing for treatment only with anti-platelet. Balloon guide was withdrawn. Angiogram of the cervical ICA was performed. Balloon guide was then removed. The skin at the puncture site was then cleaned with Chlorhexidine. The 8 French sheath was removed and an 39F angioseal was deployed. Flat panel CT was performed. Patient tolerated the procedure well and remained hemodynamically stable throughout. No complications were encountered and  no significant blood loss encountered. IMPRESSION: Status post ultrasound guided access right common femoral artery for left-sided cervical/cerebral angiogram, mechanical thrombectomy of a presumed in-situ thrombosis of focal atheroscerlotic plaque at the proximal left M2, achieving TICI 2c flow. Angiogram demonstrates diffuse ICAD of the imaged ACA and MCA territory Angio-Seal for hemostasis. Signed, Dulcy Fanny. Dellia Nims, RPVI [facility] Radiology  PLAN: The patient will remain intubated, given small volume intraparenchymal hemorrhage ICU status Target systolic blood pressure of 120-140 Right hip straight time 6 hours Frequent neurovascular checks Repeat neurologic imaging with CT and/MRI at the discretion of neurology team Electronically Signed   By: Corrie Mckusick D.O.   On: 03/07/2021 04:23   DG CHEST PORT 1 VIEW  Result Date: 03/08/2021 CLINICAL DATA:  Acute respiratory failure with hypoxia. EXAM: PORTABLE CHEST 1 VIEW COMPARISON:  Chest x-rays dated 03/07/2021 and 01/14/2017. FINDINGS: Endotracheal tube has been removed since yesterday's exam. Heart size an RIGHT lung is clear. Osseous structures about the chest are unremarkable. D mediastinal contours are grossly stable. Probable mild atelectasis and/or small pleural effusion at the LEFT lung base. No pneumothorax is seen. IMPRESSION: 1. Interval extubation. 2. Probable mild atelectasis and/or small pleural effusion at the LEFT lung base. 3. No evidence of pneumonia or pulmonary edema. Electronically Signed   By: Franki Cabot M.D.   On: 03/08/2021 06:51   DG CHEST PORT 1 VIEW  Result Date: 03/07/2021 CLINICAL DATA:  Check endotracheal tube placement EXAM: PORTABLE CHEST 1 VIEW COMPARISON:  09/14/2016 FINDINGS: Check shadow is within normal limits. Aortic calcifications are seen. Endotracheal tube is noted 2 cm above the carina. Lungs are well aerated bilaterally. Minimal left basilar atelectasis is seen. No bony abnormality is noted. IMPRESSION: Endotracheal tube in satisfactory position. Mild left basilar atelectasis. Electronically Signed   By: Inez Catalina M.D.   On: 03/07/2021 04:02   DG Abd Portable 1V  Result Date: 03/07/2021 CLINICAL DATA:  Orogastric tube placement EXAM: PORTABLE ABDOMEN - 1 VIEW COMPARISON:  01/02/2014 FINDINGS: Limited radiograph of the lower chest and upper abdomen was obtained for the purposes of enteric tube localization. Enteric tube is seen coursing below  the diaphragm with distal tip and side port terminating within the expected location of the distal gastric body. Excreted contrast is noted within the renal collecting systems and urinary bladder. IMPRESSION: Enteric tube within the distal gastric body. Electronically Signed   By: Davina Poke D.O.   On: 03/07/2021 13:18   ECHOCARDIOGRAM COMPLETE  Result Date: 03/08/2021    ECHOCARDIOGRAM REPORT   Patient Name:   Lisa Sutton Date of Exam: 03/08/2021 Medical Rec #:  [id number]         Height:       63.0 in Accession #:    [id number]        Weight:       156.1 lb Date of Birth:  06-05-1933         BSA:          1.740 m Patient Age:    35 years          BP:           132/69 mmHg Patient Gender: F                 HR:           87 bpm. Exam Location:  Inpatient Procedure: 2D Echo, Cardiac Doppler and Color Doppler Indications:    Stroke  History:        Patient has no prior history of Echocardiogram examinations.                 Risk Factors:Hypertension and Diabetes. GERD.  Sonographer:    Clayton Lefort RDCS (AE) Referring Phys: [id number] [place]  1. Left ventricular ejection fraction, by estimation, is 60 to 65%. The left ventricle has normal function. The left ventricle has no regional wall motion abnormalities. There is mild concentric left ventricular hypertrophy. Left ventricular diastolic parameters are indeterminate.  2. Right ventricular systolic function is normal. The right ventricular size is normal. There is normal pulmonary artery systolic pressure. The estimated right ventricular systolic pressure is 41.9 mmHg.  3. The mitral valve is degenerative. No evidence of mitral valve regurgitation. No evidence of mitral stenosis. Moderate to severe mitral annular calcification.  4. The aortic valve is grossly normal. Aortic valve regurgitation is not visualized. No aortic stenosis is present.  5. The inferior vena cava is normal in size with greater than 50% respiratory variability,  suggesting right atrial pressure of 3 mmHg. Conclusion(s)/Recommendation(s): No intracardiac source of embolism detected on this transthoracic study. A transesophageal echocardiogram is recommended to exclude cardiac source of embolism if clinically indicated. FINDINGS  Left Ventricle: Left ventricular ejection fraction, by estimation, is 60 to 65%. The left ventricle has normal function. The left ventricle has no regional wall motion abnormalities. The left ventricular internal cavity size was normal in size. There is  mild concentric left ventricular hypertrophy. Left ventricular diastolic function could not be evaluated due to mitral annular calcification (moderate or greater). Left ventricular diastolic parameters are indeterminate. Right Ventricle: The right ventricular size is normal. No increase in right ventricular wall thickness. Right ventricular systolic function is normal. There is normal pulmonary artery systolic pressure. The tricuspid regurgitant velocity is 2.04 m/s, and  with an assumed right atrial pressure of 3 mmHg, the estimated right ventricular systolic pressure is 62.2 mmHg. Left Atrium: Left atrial size was normal in size. Right Atrium: Right atrial size was normal in size. Pericardium: Trivial pericardial effusion is present. Presence of pericardial fat pad. Mitral Valve: The mitral valve is degenerative in appearance. Moderate to severe mitral annular calcification. No evidence of mitral valve regurgitation. No evidence of mitral valve stenosis. MV peak gradient, 8.2 mmHg. The mean mitral valve gradient is 3.0 mmHg. Tricuspid Valve: The tricuspid valve is grossly normal. Tricuspid valve regurgitation is trivial. No evidence of tricuspid stenosis. Aortic Valve: The aortic valve is grossly normal. Aortic valve regurgitation is not visualized. No aortic stenosis is present. Aortic valve mean gradient measures 6.0 mmHg. Aortic valve peak gradient measures 10.4 mmHg. Aortic valve area, by VTI  measures  2.57 cm. Pulmonic Valve: The pulmonic valve was grossly normal. Pulmonic valve regurgitation is not visualized. No evidence of pulmonic stenosis. Aorta: The aortic root is normal in size and structure. Venous: The inferior vena cava is normal in size with greater than 50% respiratory variability, suggesting right atrial pressure of 3 mmHg. IAS/Shunts: The atrial septum is grossly normal.  LEFT VENTRICLE PLAX 2D LVIDd:         3.80 cm   Diastology LVIDs:         2.70 cm   LV e' medial:    5.85 cm/s LV PW:         1.20 cm   LV E/e' medial:  18.6 LV IVS:        1.20 cm   LV e'  lateral:   10.10 cm/s LVOT diam:     2.00 cm   LV E/e' lateral: 10.8 LV SV:         80 LV SV Index:   46 LVOT Area:     3.14 cm  RIGHT VENTRICLE             IVC RV Basal diam:  3.20 cm     IVC diam: 2.10 cm RV S prime:     17.60 cm/s TAPSE (M-mode): 2.6 cm LEFT ATRIUM             Index        RIGHT ATRIUM           Index LA diam:        3.30 cm 1.90 cm/m   RA Area:     17.90 cm LA Vol (A2C):   48.3 ml 27.75 ml/m  RA Volume:   53.40 ml  30.68 ml/m LA Vol (A4C):   51.5 ml 29.59 ml/m LA Biplane Vol: 51.6 ml 29.65 ml/m  AORTIC VALVE AV Area (Vmax):    2.44 cm AV Area (Vmean):   2.23 cm AV Area (VTI):     2.57 cm AV Vmax:           161.00 cm/s AV Vmean:          121.000 cm/s AV VTI:            0.313 m AV Peak Grad:      10.4 mmHg AV Mean Grad:      6.0 mmHg LVOT Vmax:         125.00 cm/s LVOT Vmean:        86.000 cm/s LVOT VTI:          0.256 m LVOT/AV VTI ratio: 0.82  AORTA Ao Root diam: 3.50 cm MITRAL VALVE                TRICUSPID VALVE MV Area (PHT): 3.07 cm     TR Peak grad:   16.6 mmHg MV Area VTI:   2.77 cm     TR Vmax:        204.00 cm/s MV Peak grad:  8.2 mmHg MV Mean grad:  3.0 mmHg     SHUNTS MV Vmax:       1.43 m/s     Systemic VTI:  0.26 m MV Vmean:      85.4 cm/s    Systemic Diam: 2.00 cm MV Decel Time: 247 msec MV E velocity: 109.00 cm/s MV A velocity: 138.00 cm/s MV E/A ratio:  0.79 Eleonore Chiquito MD  Electronically signed by Eleonore Chiquito MD Signature Date/Time: 03/08/2021/2:36:59 PM    Final    IR PERCUTANEOUS ART THROMBECTOMY/INFUSION INTRACRANIAL INC DIAG ANGIO  Result Date: 03/07/2021 INDICATION: 87 year old female presents with acute stroke symptoms, corresponding to left M2 inferior division emergent large vessel occlusion. She presents for angiogram and mechanical thrombectomy EXAM: ULTRASOUND-GUIDED ACCESS RIGHT COMMON FEMORAL ARTERY CERVICAL AND CEREBRAL ANGIOGRAM MECHANICAL THROMBECTOMY LEFT MCA BRANCHES ANGIO-SEAL FOR HEMOSTASIS COMPARISON:  CT imaging of the same day MEDICATIONS: 650 mg OG.  Plavix Bolus of kangrelor, with the drip stopped secondary to intracranial hemorrhage ANESTHESIA/SEDATION: The anesthesia team was present to provide general endotracheal tube anesthesia and for patient monitoring during the procedure. Intubation was performed in room 2/biplane room. Left radial arterial line was performed by the anesthesia team. Interventional neuro radiology nursing staff was also present. CONTRAST:  86 cc FLUOROSCOPY TIME:  Fluoroscopy Time:  26 minutes 54 seconds (1,889 mGy). COMPLICATIONS: SIR LEVEL B - Normal therapy, includes overnight admission for observation., Intracranial hemorrhage TECHNIQUE: Informed written consent was obtained from the patient's family after a thorough discussion of the procedural risks, benefits and alternatives. All questions were addressed. Maximal Sterile Barrier Technique was utilized including caps, mask, sterile gowns, sterile gloves, sterile drape, hand hygiene and skin antiseptic. A timeout was performed prior to the initiation of the procedure. FINDINGS: Initial Findings: Left common carotid artery:  Normal course caliber and contour. Left external carotid artery: Patent with antegrade flow. Left internal carotid artery: Tortuosity of the cervical segment. No significant atherosclerotic changes. Vertical and petrous segment patent with normal course  caliber contour. Cavernous segment patent. Clinoid segment patent. Antegrade flow of the ophthalmic artery. Ophthalmic segment patent. Terminus patent. Left MCA: M1 segment patent. Occlusion of the M2 segment inferior division. Patency maintained of the superior division. Left ACA: A 1 segment patent. A 2 segment perfuses the right territory. Angiogram demonstrates diffuse intracranial atherosclerosis involving the imaged ACA and MCA territory. Completion Findings: Left MCA: After 2 passes of mechanical thrombectomy, there is restoration of flow through the occluded M2 segment. Irregularity at the proximal M2 segment, compatible with a native atherosclerotic plaque. Slow flow within the distal cortical vessels of the affected dominant inferior division territory. TICI 2c: Near complete perfusion except for slow flow in a few distal cortical vessels/presence of small distal cortical emboli Flat panel CT demonstrates small volume subarachnoid hemorrhage within the frontal sulci, Heidelberg 3c. There is also some staining of the cortex in the parietal region where there appears to be completed infarct in the territory at risk, parietal territory, compatible with Heidleberg PH1. PROCEDURE: The anesthesia team was present to provide general endotracheal tube anesthesia and for patient monitoring during the procedure. Intubation was performed in negative pressure [place] in neuro IR holding. Interventional neuro radiology nursing staff was also present. Ultrasound survey of the right inguinal region was performed with images stored and sent to PACs. Ultrasound confirmed patency of the right common femoral artery. 11 blade scalpel was used to make a small incision. Blunt dissection was performed with US guidance. A micropuncture needle was used access the right common femoral artery under direct ultrasound visualization. With excellent arterial blood flow returned, an .018 micro wire was passed through the needle, observed to  enter the abdominal aorta under fluoroscopy. The needle was removed, and a micropuncture sheath was placed over the wire. The inner dilator and wire were removed, and an 035 wire was advanced under fluoroscopy into the abdominal aorta. The sheath was removed and a 25cm 5F straight vascular sheath was placed. The dilator was removed and the sheath was flushed. Sheath was attached to pressurized and heparinized saline bag for constant forward flow. A coaxial system was then advanced over the 035 wire. This included a 95cm 087 "Walrus" balloon guide with coaxial 125cm Berenstein diagnostic catheter. This was advanced to the proximal descending thoracic aorta. Wire was then removed. Double flush of the catheter was performed. Catheter was then used to select the left common carotid artery. Angiogram was performed. Using roadmap technique, the catheter was advanced over a standard glide wire into left cervical ICA, with distal position achieved of the balloon guide. The diagnostic catheter and the wire were removed. Formal angiogram was performed. Road map function was used once the occluded vessel was identified. Copious back flush was performed and the balloon catheter was attached to heparinized and pressurized saline bag for  forward flow. A second coaxial system was then advanced through the balloon catheter, which included the selected intermediate catheter, microcatheter, and microwire. In this scenario, the set up included a 132cm CAT-5 intermediate catheter, a Trevo Provue18 microcatheter, and 014 synchro soft wire. This system was advanced through the balloon guide catheter under the road-map function, with adequate back-flush at the rotating hemostatic valve at that back end of the balloon guide. Microcatheter and the intermediate catheter system were advanced through the terminal ICA and MCA to the level of the occlusion. The micro wire was then carefully advanced through the occluded segment. Microcatheter  was then manipulated through the occluded segment and the wire was removed with saline drip at the hub. Blood was then aspirated through the hub of the microcatheter, and a gentle contrast injection was performed confirming intraluminal position. A rotating hemostatic valve was then attached to the back end of the microcatheter, and a pressurized and heparinized saline bag was attached to the catheter. 4 x 40 solitaire device was then selected. Back flush was achieved at the rotating hemostatic valve, and then the device was gently advanced through the microcatheter to the distal end. The retriever was then unsheathed by withdrawing the microcatheter under fluoroscopy. Once the retriever was completely unsheathed, the microcatheter was carefully stripped from the delivery device. Control angiogram was performed from the intermediate catheter. A 3 minute time interval was observed. The balloon at the balloon guide catheter was then inflated under fluoroscopy for proximal flow arrest. Constant aspiration using the proprietary engine was then performed at the intermediate catheter, as the retriever was gently and slowly withdrawn with fluoroscopic observation. Once the retriever was "corked" within the tip of the intermediate catheter, both were removed from the system. Free aspiration was confirmed at the hub of the balloon guide catheter, with free blood return confirmed. The balloon was then deflated, and a control angiogram was performed. Restoration of flow was confirmed, with irregularity at the site of the occlusion of the proximal M2 segment, with slow flow within the distal cortical vessels. We elected to observe a 5 minute interval time frame for reimaging, as there was a suspected native intracranial atherosclerotic plaque at the site and we anticipated a rethrombosis after the initial thrombectomy attempt. After 5 minutes angiogram was performed confirming re-occlusion. Anticipating the need for a rescue  stent, an orogastric tube was placed, 650 mg of aspirin was administered, and we initiated an IV P2 y12 inhibitor bolus. In order to attempt restoration of flow through the segment, measured the target artery, as well as identify the best site for stenting, we elected to perform an additional stent retriever thrombectomy. The microcatheter was passed with the synchro soft wire through the occluded segment into the parietal branches of the inferior division. The micro wire was carefully advanced through the occluded segment. Microcatheter was then manipulated through the occluded segment and the wire was removed with saline drip at the hub. Gentle injection confirmed intraluminal location within the selected branches. 4 x 40 solitaire device was again selected. Back flush was achieved at the rotating hemostatic valve, and then the device was gently advanced through the microcatheter to the distal end. The retriever was then unsheathed by withdrawing the microcatheter under fluoroscopy. Once the retriever was completely unsheathed, angiogram was performed confirming flow through the segment. While we initiated a measurement of the segment, we observed a 3 minute time interval. Upon withdrawal of the stent tree verb ir, significant tension was observed at the interface/interaction  of the stent tree verb ir with the artery, such that there was concern that some troubleshooting might be required in order to remove the stent retriever. A sudden release was observed and the stent retriever was removed from the balloon guide. Repeat angiogram demonstrated restoration of flow, with complete restoration of flow within the territory and slow distal flow within the cortical vessels compatible with TICI 2c: Near complete perfusion except for slow flow in a few distal cortical vessels/presence of small distal cortical emboli. As there was some concern for endothelial injury and narrowing at the site of the atherosclerotic plaque,  we elected at this time not to perform a balloon angioplasty or stenting, electing for treatment only with anti-platelet. Balloon guide was withdrawn. Angiogram of the cervical ICA was performed. Balloon guide was then removed. The skin at the puncture site was then cleaned with Chlorhexidine. The 8 French sheath was removed and an 58F angioseal was deployed. Flat panel CT was performed. Patient tolerated the procedure well and remained hemodynamically stable throughout. No complications were encountered and no significant blood loss encountered. IMPRESSION: Status post ultrasound guided access right common femoral artery for left-sided cervical/cerebral angiogram, mechanical thrombectomy of a presumed in-situ thrombosis of focal atheroscerlotic plaque at the proximal left M2, achieving TICI 2c flow. Angiogram demonstrates diffuse ICAD of the imaged ACA and MCA territory Angio-Seal for hemostasis. Signed, Dulcy Fanny. Dellia Nims, RPVI [facility] Rad[facility] PLAN: The patient will remain intubated, given small volume intraparenchymal hemorrhage ICU status Target systolic blood pressure of 120-140 Right hip straight time 6 hours Frequent neurovascular checks Repeat neurologic imaging with CT and/MRI at the discretion of neurology team Electronically Signed   By: Corrie Mckusick D.O.   On: 03/07/2021 04:23   CT HEAD CODE STROKE WO CONTRAST  Result Date: 03/06/2021 CLINICAL DATA:  Code stroke.  Encephalopathy EXAM: CT HEAD WITHOUT CONTRAST TECHNIQUE: Contiguous axial images were obtained from the base of the skull through the vertex without intravenous contrast. COMPARISON:  None. FINDINGS: Brain: There is no mass, hemorrhage or extra-axial collection. The size and configuration of the ventricles and extra-axial CSF spaces are normal. There is hypoattenuation of the periventricular white matter, most commonly indicating chronic ischemic microangiopathy. Multiple old small  vessel infarcts. Vascular: Atherosclerotic calcification of the internal carotid arteries at the skull base. No abnormal hyperdensity of the major intracranial arteries or dural venous sinuses. Skull: The visualized skull base, calvarium and extracranial soft tissues are normal. Sinuses/Orbits: No fluid levels or advanced mucosal thickening of the visualized paranasal sinuses. No mastoid or middle ear effusion. The orbits are normal. ASPECTS [facility] Stroke Program Early CT Score) - Ganglionic level infarction (caudate, lentiform nuclei, internal capsule, insula, M1-M3 cortex): 7 - Supraganglionic infarction (M4-M6 cortex): 3 Total score (0-10 with 10 being normal): 10 IMPRESSION: 1. No acute intracranial abnormality. 2. ASPECTS is 10. 3. Chronic ischemic microangiopathy and multiple old small vessel infarcts. These results were called by telephone at the time of interpretation on 03/06/2021 at 10:36 pm to provider [facility] , who verbally acknowledged these results. Electronically Signed   By: Ulyses Jarred M.D.   On: 03/06/2021 22:36   VAS [place] LOWER EXTREMITY VENOUS (DVT)  Result Date: 03/08/2021  Lower Venous DVT Study Patient Name:  Lisa Sutton  Date of Exam:   03/08/2021 Medical Rec #: [id number]          Accession #:    [id number] Date of Birth: 04-09-1934  Patient Gender: F Patient Age:   89 years Exam Location:  [facility] Procedure:      VAS [place] LOWER EXTREMITY VENOUS (DVT) Referring Phys: Cornelius Moras Donnalyn Juran --------------------------------------------------------------------------------  Indications: Stroke.  Comparison Study: No prior study on file Performing Technologist: Sharion Dove RVS  Examination Guidelines: A complete evaluation includes B-mode imaging, spectral Doppler, color Doppler, and power Doppler as needed of all accessible portions of each vessel. Bilateral testing is considered an integral part of a complete examination. Limited examinations for reoccurring indications  may be performed as noted. The reflux portion of the exam is performed with the patient in reverse Trendelenburg.  +---------+---------------+---------+-----------+----------+--------------+ RIGHT    CompressibilityPhasicitySpontaneityPropertiesThrombus Aging +---------+---------------+---------+-----------+----------+--------------+ CFV      Full           Yes      Yes                                 +---------+---------------+---------+-----------+----------+--------------+ SFJ      Full                                                        +---------+---------------+---------+-----------+----------+--------------+ FV Prox  Full                                                        +---------+---------------+---------+-----------+----------+--------------+ FV Mid   Full                                                        +---------+---------------+---------+-----------+----------+--------------+ FV DistalFull                                                        +---------+---------------+---------+-----------+----------+--------------+ PFV      Full                                                        +---------+---------------+---------+-----------+----------+--------------+ POP      Full           Yes      Yes                                 +---------+---------------+---------+-----------+----------+--------------+ PTV      Full                                                        +---------+---------------+---------+-----------+----------+--------------+  PERO     Full                                                        +---------+---------------+---------+-----------+----------+--------------+   +---------+---------------+---------+-----------+----------+--------------+ LEFT     CompressibilityPhasicitySpontaneityPropertiesThrombus Aging +---------+---------------+---------+-----------+----------+--------------+ CFV       Full           Yes      Yes                                 +---------+---------------+---------+-----------+----------+--------------+ SFJ      Full                                                        +---------+---------------+---------+-----------+----------+--------------+ FV Prox  Full                                                        +---------+---------------+---------+-----------+----------+--------------+ FV Mid   Full                                                        +---------+---------------+---------+-----------+----------+--------------+ FV DistalFull                                                        +---------+---------------+---------+-----------+----------+--------------+ PFV      Full                                                        +---------+---------------+---------+-----------+----------+--------------+ POP      Full           Yes      Yes                                 +---------+---------------+---------+-----------+----------+--------------+ PTV      Full                                                        +---------+---------------+---------+-----------+----------+--------------+ PERO     Full                                                        +---------+---------------+---------+-----------+----------+--------------+  Summary: BILATERAL: - No evidence of deep vein thrombosis seen in the lower extremities, bilaterally. -   *See table(s) above for measurements and observations. Electronically signed by Monica Martinez MD on 03/08/2021 at 12:39:46 PM.    Final    CT ANGIO HEAD NECK W WO CM W PERF (CODE STROKE)  Result Date: 03/06/2021 CLINICAL DATA:  Encephalopathy with difficulty speaking EXAM: CT ANGIOGRAPHY HEAD AND NECK CT PERFUSION BRAIN TECHNIQUE: Multidetector CT imaging of the head and neck was performed using the standard protocol during bolus administration of intravenous  contrast. Multiplanar CT image reconstructions and MIPs were obtained to evaluate the vascular anatomy. Carotid stenosis measurements (when applicable) are obtained utilizing NASCET criteria, using the distal internal carotid diameter as the denominator. Multiphase CT imaging of the brain was performed following IV bolus contrast injection. Subsequent parametric perfusion maps were calculated using RAPID software. CONTRAST:  179mL OMNIPAQUE IOHEXOL 350 MG/ML SOLN COMPARISON:  None. FINDINGS: CTA NECK FINDINGS SKELETON: There is no bony spinal canal stenosis. No lytic or blastic lesion. OTHER NECK: Normal pharynx, larynx and major salivary glands. No cervical lymphadenopathy. Unremarkable thyroid gland. UPPER CHEST: No pneumothorax or pleural effusion. No nodules or masses. AORTIC ARCH: There is calcific atherosclerosis of the aortic arch. There is no aneurysm, dissection or hemodynamically significant stenosis of the visualized portion of the aorta. Conventional 3 vessel aortic branching pattern. The visualized proximal subclavian arteries are widely patent. RIGHT CAROTID SYSTEM: Normal without aneurysm, dissection or stenosis. LEFT CAROTID SYSTEM: Normal without aneurysm, dissection or stenosis. VERTEBRAL ARTERIES: Left dominant configuration. Narrowing of the left vertebral artery origin. There is no dissection, occlusion or flow-limiting stenosis to the skull base (V1-V3 segments). CTA HEAD FINDINGS POSTERIOR CIRCULATION: --Vertebral arteries: Normal V4 segments. --Inferior cerebellar arteries: Normal. --Basilar artery: Normal. --Superior cerebellar arteries: Normal. --Posterior cerebral arteries (PCA): Normal. ANTERIOR CIRCULATION: --Intracranial internal carotid arteries: Atherosclerotic calcification of the internal carotid arteries at the skull base without hemodynamically significant stenosis. --Anterior cerebral arteries (ACA): Normal. Both A1 segments are present. Patent anterior communicating artery  (a-comm). --Middle cerebral arteries (MCA): There is a proximal left MCA M2 segment occlusion (series 7 images 80 6-88). Otherwise, the middle cerebral arteries are normal. VENOUS SINUSES: As permitted by contrast timing, patent. ANATOMIC VARIANTS: None Review of the MIP images confirms the above findings. CT Brain Perfusion Findings: ASPECTS: 10 CBF (<30%) Volume: 55mL Perfusion (Tmax>6.0s) volume: 43mL Mismatch Volume: 60mL Infarction Location:Left MCA territory IMPRESSION: Proximal left MCA M2 segment occlusion with 4 mL left MCA territory core infarct and 54 mL ischemic penumbra. Critical Value/emergent results were called by telephone at the time of interpretation on 03/06/2021 at 11:35 pm to provider [facility] , who verbally acknowledged these results. Electronically Signed   By: Ulyses Jarred M.D.   On: 03/06/2021 23:35      HISTORY OF PRESENT ILLNESS   Lisa Sutton is a 87 y.o. female with PMH significant for HTN, GERD, Hep B, HLD, Obesity, osteoporosis who initially presented to [facility] ED with aphasia. Was evaluated by teleneurology, LKW of 1815, did not seem like she was eating right at 1830 and difficulty holding a fork. Baseline mRS is a 0. CTH with ASPECTS of 10, CTA with L MCA M2 occlusion with a 54cc penumbra and a 4cc core. She was outside thrombolytics window and was emergently transferred to [facility] for thrombectomy.  MRI brain showed . Patchy acute infarcts clustered in the superior division left MCA territory but also seen elsewhere in the left  MCA territory and to a limited extent the right parietal cortex.  Background of advanced chronic small vessel disease.    HOSPITAL COURSE Lisa Sutton is a 87 y.o. female with history of  HTN, GERD, Hep B, HLD, Obesity, osteoporosis admitted for aphasia.   Stroke: Left MCA stroke due to left M2 inferior division occlusion s/p IR, etiology likely large vessel disease, although cardioembolic source can not be completely  ruled out Code Stroke CT head. No acute intracranial abnormality. ASPECTS is 10. CTA head & neck : Proximal left MCA M2/M3 segment occlusion with 4 mL left MCA territory core infarct and 54 mL ischemic penumbra. Cerebral angio left M2 occlusion with TICI 2c reperfusion, but M3 reoccluded Post IR CT : Concern for [facility] with small volume MRI brain showed left MCA patchy scattered infarct moderate size, but no SAH seen on SWI.  MRA showed continued left M2 occlusion and b/l M2 stenosis.  2D Echo EF 60-65% Recommend 30 day cardiac event monitoring as outpt to rule out afib Bilat LE [place] No evidence of DVT bilaterally LDL 123 HgbA1c 6.2 VTE prophylaxis - SCd's No antithrombotic prior to admission, now on aspirin 325 mg daily and clopidogrel 75 mg daily. 3 months and then ASA alone  Therapy recommendations:  SNF Disposition:  pending   Hypertension Home meds:  atenolol, norvasc, irbesartan Stable Long-term BP goal normotensive   Hyperlipidemia Home meds: Repatha listed but reported as not taking, family reports stopped after one week due leg aches.  statin intolerance has Zetia allergy kidney function not a candidate for fenofibrate currently on Lovaza.   LDL 123, goal < 70 She needs close outpatient follow-up with her PCP for better HLD management.    Diabetes type II Controlled Home meds:  glucotrol prn for "big meal" at home  HgbA1c 6.2, goal < 7.0 CBGs SSI   S/p intubation with mechanical ventilation Intubated pre-procedure and not initially extubated 2/2 concern for developing bleed Extubated 10/15 1635 Tolerating well   AKI on CKD Cre 1.46->1.67->1.97->1.78-->1.56--> 1.52->1.99 Saline locked MIVF Now on diet Encourage po intake   Other Stroke Risk Factors Advanced Age >/= 39    Other Active Problems Anxiety:  Cymbalta daily, Xanax prn on home list (need to sort out her actual use when able) Hypothyroidism: continue home synthroid        DISCHARGE EXAM Blood  pressure (!) 145/76, pulse 62, temperature 98 F (36.7 C), temperature source Oral, resp. rate 17, height 5\' 3"  (1.6 m), weight 70.8 kg, SpO2 100 %.  General - Well nourished, well developed, in no apparent distress.   Cardiovascular - Regular rhythm and rate, sinus bradycardia   Neuro - awake, alert, eyes open, still has aphasia, expressive more than receptive.  Able to follow most simple commands. Not able to name but able to repeat 3-5 word sentences. No significant dysarthria. Not orientated to time, place or age but orientated to people. No gaze palsy, tracking bilaterally, blinking to visual threat bilaterally, PERRL. No facial droop. Tongue protrusion not cooperative. RUE mild pronator drift, however, b/l finger grip symmetrical. BLEs 3+/5.  Sensation not cooperative, b/l FTN intact, gait not tested.     Discharge Diet       Diet   DIET DYS 3 Room service appropriate? Yes with Assist; Fluid consistency: Thin   liquids  DISCHARGE PLAN Disposition:  SNF aspirin 81 mg daily and clopidogrel 75 mg daily for secondary stroke prevention for 3 months then aspirin alone. Ongoing stroke risk  factor control by Primary Care Physician at time of discharge Follow-up PCP Drue Second IV, FNP in 2 weeks. Follow-up in [facility] Dr. Leta Baptist in 4 weeks, office to schedule an appointment.   45 minutes were spent preparing discharge.   Rosalin Hawking, MD PhD Stroke Neurology 03/13/2021 11:33 AM

## 2021-03-13 NOTE — Plan of Care (Signed)
  Problem: Safety: Goal: Ability to remain free from injury will improve Outcome: Progressing   Problem: Education: Goal: Knowledge of disease or condition will improve Outcome: Progressing   Problem: Ischemic Stroke/TIA Tissue Perfusion: Goal: Complications of ischemic stroke/TIA will be minimized Outcome: Progressing

## 2021-03-13 NOTE — Progress Notes (Signed)
Lisa Sutton D/C'd Skilled nursing facility per MD order.  Discussed with the patient and all questions fully answered.  VSS, Skin clean, dry and intact without evidence of skin break down, no evidence of skin tears noted. IV catheter discontinued intact. Site without signs and symptoms of complications. Dressing and pressure applied.  An After Visit Summary was printed and given to the patient son, Heron Sabins. Informed him that medication sent to the pharmacy and he can pick them up.  D/c education completed with patient/family including follow up instructions, medication list, d/c activities limitations if indicated, with other d/c instructions as indicated by MD - patient able to verbalize understanding, all questions fully answered.   Patient instructed to return to ED, call 911, or call MD for any changes in condition.   Patient escorted via [place], and D/C Facility via private auto at 1320.Dorris Carnes 03/13/2021 12:39 PM

## 2021-03-13 NOTE — Plan of Care (Signed)
  Problem: Education: Goal: Knowledge of General Education information will improve Description: Including pain rating scale, medication(s)/side effects and non-pharmacologic comfort measures 03/13/2021 1201 by Dorris Carnes, RN Outcome: Adequate for Discharge 03/13/2021 1201 by Dorris Carnes, RN Outcome: Progressing   Problem: Clinical Measurements: Goal: Ability to maintain clinical measurements within normal limits will improve 03/13/2021 1201 by Dorris Carnes, RN Outcome: Adequate for Discharge 03/13/2021 1201 by Dorris Carnes, RN Outcome: Progressing Goal: Will remain free from infection 03/13/2021 1201 by Dorris Carnes, RN Outcome: Adequate for Discharge 03/13/2021 1201 by Dorris Carnes, RN Outcome: Progressing Goal: Diagnostic test results will improve 03/13/2021 1201 by Dorris Carnes, RN Outcome: Adequate for Discharge 03/13/2021 1201 by Dorris Carnes, RN Outcome: Progressing Goal: Respiratory complications will improve 03/13/2021 1201 by Dorris Carnes, RN Outcome: Adequate for Discharge 03/13/2021 1201 by Dorris Carnes, RN Outcome: Progressing Goal: Cardiovascular complication will be avoided 03/13/2021 1201 by Dorris Carnes, RN Outcome: Adequate for Discharge 03/13/2021 1201 by Dorris Carnes, RN Outcome: Progressing   Problem: Clinical Measurements: Goal: Respiratory complications will improve 03/13/2021 1201 by Dorris Carnes, RN Outcome: Adequate for Discharge 03/13/2021 1201 by Dorris Carnes, RN Outcome: Progressing   Problem: Activity: Goal: Risk for activity intolerance will decrease 03/13/2021 1201 by Dorris Carnes, RN Outcome: Adequate for Discharge 03/13/2021 1201 by Dorris Carnes, RN Outcome: Progressing   Problem: Nutrition: Goal: Adequate nutrition will be maintained 03/13/2021 1201 by Dorris Carnes, RN Outcome: Adequate for Discharge 03/13/2021 1201 by Dorris Carnes,  RN Outcome: Progressing   Problem: Coping: Goal: Level of anxiety will decrease 03/13/2021 1201 by Dorris Carnes, RN Outcome: Adequate for Discharge 03/13/2021 1201 by Dorris Carnes, RN Outcome: Progressing   Problem: Elimination: Goal: Will not experience complications related to bowel motility 03/13/2021 1201 by Dorris Carnes, RN Outcome: Adequate for Discharge 03/13/2021 1201 by Dorris Carnes, RN Outcome: Progressing Goal: Will not experience complications related to urinary retention 03/13/2021 1201 by Dorris Carnes, RN Outcome: Adequate for Discharge 03/13/2021 1201 by Dorris Carnes, RN Outcome: Progressing   Problem: Safety: Goal: Ability to remain free from injury will improve 03/13/2021 1201 by Dorris Carnes, RN Outcome: Adequate for Discharge 03/13/2021 1201 by Dorris Carnes, RN Outcome: Progressing   Problem: Ischemic Stroke/TIA Tissue Perfusion: Goal: Complications of ischemic stroke/TIA will be minimized 03/13/2021 1201 by Dorris Carnes, RN Outcome: Adequate for Discharge 03/13/2021 1201 by Dorris Carnes, RN Outcome: Progressing

## 2021-03-13 NOTE — TOC Transition Note (Signed)
Transition of Care [facility]) - CM/SW Discharge Note   Patient Details  Name: Lisa Sutton MRN: [id number] Date of Birth: 10/25/1933  Transition of Care [facility] CM/SW Contact:  Geralynn Ochs, LCSW Phone Number: 03/13/2021, 11:48 AM   Clinical Narrative:   Nurse to call report to [phone], Room 104.    Final next level of care: [place] Barriers to Discharge: Barriers Resolved   Patient Goals and CMS Choice Patient states their goals for this hospitalization and ongoing recovery are:: Patient agreeable to ST rehab in a skilled nursing facility, then home CMS Medicare.gov Compare Post Acute Care list provided to:: Other (Comment Required) (Talked with son regarding Medicare.gov and how to access it online) Choice offered to / list presented to : Adult Children (Provided son with Medicare.gov website information)  Discharge Placement              Patient chooses bed at: [place] Patient to be transferred to facility by: Family Name of family member notified: Joey Patient and family notified of of transfer: 03/13/21  Discharge Plan and Services In-house Referral: Clinical Social Work   Post Acute Care Choice: [place]                               Social Determinants of Health (SDOH) Interventions     Readmission Risk Interventions No flowsheet data found.

## 2021-03-13 NOTE — Progress Notes (Signed)
Ordered 30 day Event Monitor for further evaluation of CVA at the request of Neurology. Of note, patient has not been seen by our office since 2014. Therefore, will arrange New Patient Visit in about 6-8 weeks to discuss results.   Darreld Mclean, PA-C 03/13/2021 1:44 PM

## 2021-03-13 NOTE — Telephone Encounter (Signed)
error 

## 2021-03-16 ENCOUNTER — Other Ambulatory Visit: Payer: Medicare Other

## 2021-03-16 ENCOUNTER — Encounter: Payer: Self-pay | Admitting: *Deleted

## 2021-03-16 ENCOUNTER — Telehealth (HOSPITAL_BASED_OUTPATIENT_CLINIC_OR_DEPARTMENT_OTHER): Payer: Self-pay | Admitting: Cardiology

## 2021-03-16 NOTE — Telephone Encounter (Signed)
Left message for patient to call and discuss post hospital visit scheduled Monday 05/04/21 at 10:40 am with Dr. Harrell Gave (per 03/13/21 staff message from [place], [place])

## 2021-03-23 ENCOUNTER — Telehealth (HOSPITAL_BASED_OUTPATIENT_CLINIC_OR_DEPARTMENT_OTHER): Payer: Self-pay | Admitting: Cardiology

## 2021-03-23 NOTE — Telephone Encounter (Signed)
Left message for patient regarding  the Monday 05/04/21 10:40 am appointment with Dr. Vilinda Boehringer patient I will mail the information to her and if she has questions or concerns to please call.

## 2021-03-26 ENCOUNTER — Ambulatory Visit (INDEPENDENT_AMBULATORY_CARE_PROVIDER_SITE_OTHER): Payer: Medicare Other

## 2021-03-26 DIAGNOSIS — I63512 Cerebral infarction due to unspecified occlusion or stenosis of left middle cerebral artery: Secondary | ICD-10-CM | POA: Diagnosis not present

## 2021-03-26 DIAGNOSIS — I4891 Unspecified atrial fibrillation: Secondary | ICD-10-CM | POA: Diagnosis not present

## 2021-04-14 ENCOUNTER — Other Ambulatory Visit: Payer: Self-pay | Admitting: Student

## 2021-04-14 DIAGNOSIS — I63512 Cerebral infarction due to unspecified occlusion or stenosis of left middle cerebral artery: Secondary | ICD-10-CM

## 2021-04-14 DIAGNOSIS — I4891 Unspecified atrial fibrillation: Secondary | ICD-10-CM

## 2021-05-04 ENCOUNTER — Ambulatory Visit (HOSPITAL_BASED_OUTPATIENT_CLINIC_OR_DEPARTMENT_OTHER): Payer: Medicare Other | Admitting: Cardiology

## 2021-05-04 ENCOUNTER — Encounter (HOSPITAL_BASED_OUTPATIENT_CLINIC_OR_DEPARTMENT_OTHER): Payer: Self-pay | Admitting: Cardiology

## 2021-05-04 ENCOUNTER — Other Ambulatory Visit: Payer: Self-pay

## 2021-05-04 VITALS — BP 127/86 | HR 89 | Ht 63.0 in | Wt 155.3 lb

## 2021-05-04 DIAGNOSIS — Z7189 Other specified counseling: Secondary | ICD-10-CM

## 2021-05-04 DIAGNOSIS — Z8673 Personal history of transient ischemic attack (TIA), and cerebral infarction without residual deficits: Secondary | ICD-10-CM | POA: Diagnosis not present

## 2021-05-04 DIAGNOSIS — I10 Essential (primary) hypertension: Secondary | ICD-10-CM

## 2021-05-04 DIAGNOSIS — Z712 Person consulting for explanation of examination or test findings: Secondary | ICD-10-CM

## 2021-05-04 DIAGNOSIS — E1169 Type 2 diabetes mellitus with other specified complication: Secondary | ICD-10-CM | POA: Diagnosis not present

## 2021-05-04 DIAGNOSIS — E785 Hyperlipidemia, unspecified: Secondary | ICD-10-CM

## 2021-05-04 NOTE — Progress Notes (Signed)
Cardiology Office Note:    Date:  05/04/2021   ID:  Lisa Sutton, DOB 08-10-1933, MRN [id number]  PCP:  Wannetta Sender, FNP  Cardiologist:  Buford Dresser, MD  Referring MD: Drue Second I*   Cc: new patient, follow up of recent stroke hospitalization  History of Present Illness:    Lisa Sutton is a 87 y.o. female with a hx of CVA, hypertension, hyperlipidemia, type II diabetes who is seen as a new consult at the request of Drue Second I* for the evaluation and management of recent CVA.  Recent hospitalization reviewed. Found to have left MCA stroke due to left M2 inferior division occlusion s/p IR. Monitor placed for evaluation of atrial fibrillation.  Today: Here with family member. Has some speech difficulties, assisted by family member.  Reviewed recent hospitalization. Reviewed monitor. Has occasional chest pain, lasts only seconds, happens every few days. Not exertional.   Reviewed types of stroke, why we order monitors, recommendations for management. All questions answered.  Denies shortness of breath at rest or with normal exertion. No PND, orthopnea, LE edema or unexpected weight gain. No syncope or palpitations.   Past Medical History:  Diagnosis Date   Anxiety    Arthritis    Cataract    Dysphagia    'sometimes but not a major issue' been checked out by GI (per pt)   Dysrhythmia    'heart used to skip but doesn't anymore' was checked out by Dr. Ron Parker late '90s, everything checked out ok and not had any skipping since (all per pt)   Essential hypertension, benign    Fatty liver    GERD (gastroesophageal reflux disease)    H/O hypercalcemia 04/12/2017   Hepatitis B    had at age 20, 'GI doc said it's gone away'   Hepatitis B surface antigen positive    High cholesterol    History of hiatal hernia    Hypercalcemia    Hypothyroidism    Kidney stone August 2014   Patient was seen at [facility]   Obesity     Osteoporosis    PONV (postoperative nausea and vomiting)    Type 2 diabetes mellitus ([place])     Past Surgical History:  Procedure Laterality Date   ABDOMINAL HYSTERECTOMY     BILATERAL CARPAL TUNNEL RELEASE     Dr. Marlou Sa at surgical center   BIOPSY  05/26/2017   Procedure: BIOPSY;  Surgeon: Rogene Houston, MD;  [facility];  Service: Endoscopy;;  colon   BIOPSY  10/11/2018   Procedure: BIOPSY;  Surgeon: Rogene Houston, MD;  [facility];  Service: Endoscopy;;   CATARACT EXTRACTION, BILATERAL     COLONOSCOPY N/A 05/26/2017   Procedure: COLONOSCOPY;  Surgeon: Rogene Houston, MD;  [facility];  Service: Endoscopy;  Laterality: N/A;  10:30   Deviated septum repair     ESOPHAGEAL DILATION N/A 08/16/2019   Procedure: ESOPHAGEAL DILATION;  Surgeon: Rogene Houston, MD;  [facility];  Service: Endoscopy;  Laterality: N/A;   ESOPHAGOGASTRODUODENOSCOPY N/A 07/28/2016   Procedure: ESOPHAGOGASTRODUODENOSCOPY (EGD);  Surgeon: Rogene Houston, MD;  [facility];  Service: Endoscopy;  Laterality: N/A;  300 - moved to 3/7 @ 1:00   ESOPHAGOGASTRODUODENOSCOPY N/A 08/16/2019   Procedure: ESOPHAGOGASTRODUODENOSCOPY (EGD);  Surgeon: Rogene Houston, MD;  [facility];  Service: Endoscopy;  Laterality: N/A;  230   EYE SURGERY  FLEXIBLE SIGMOIDOSCOPY N/A 10/11/2018   Procedure: FLEXIBLE SIGMOIDOSCOPY;  Surgeon: Rogene Houston, MD;  [facility];  Service: Endoscopy;  Laterality: N/A;  unable to complete colonoscopy    IR CT HEAD LTD  03/07/2021   IR PERCUTANEOUS ART THROMBECTOMY/INFUSION INTRACRANIAL INC DIAG ANGIO  03/07/2021   IR US GUIDE VASC ACCESS RIGHT  03/07/2021   KNEE ARTHROPLASTY Left 09/15/2015   Procedure: COMPUTER ASSISTED TOTAL KNEE ARTHROPLASTY;  Surgeon: Marybelle Killings, MD;  Location: [place];  Service: Orthopedics;  Laterality: Left;   KNEE ARTHROSCOPY     left knee 2003   RADIOLOGY WITH ANESTHESIA N/A 03/07/2021    Procedure: IR WITH ANESTHESIA;  Surgeon: Corrie Mckusick, DO;  Location: [place];  Service: Anesthesiology;  Laterality: N/A;    Current Medications: Current Outpatient Medications on File Prior to Visit  Medication Sig   ALPRAZolam (XANAX) 0.25 MG tablet 1 tablet as needed   amLODipine (NORVASC) 10 MG tablet Take 10 mg by mouth daily.   aspirin EC 325 MG EC tablet Take 1 tablet (325 mg total) by mouth daily.   atenolol (TENORMIN) 25 MG tablet Take 1 tablet (25 mg total) by mouth daily.   clopidogrel (PLAVIX) 75 MG tablet Take 1 tablet (75 mg total) by mouth daily.   DULoxetine (CYMBALTA) 30 MG capsule Take 1 capsule (30 mg total) by mouth daily. (Patient taking differently: Take 30 mg by mouth at bedtime.)   irbesartan (AVAPRO) 150 MG tablet Take 1 tablet (150 mg total) by mouth daily.   levothyroxine (SYNTHROID) 125 MCG tablet Take 1 tablet (125 mcg total) by mouth daily before breakfast.   pantoprazole (PROTONIX) 40 MG tablet Take 1 tablet (40 mg total) by mouth daily before lunch.   rosuvastatin (CRESTOR) 5 MG tablet TAKE 1 TABLET BY MOUTH  DAILY (Patient taking differently: Take 5 mg by mouth at bedtime.)   b complex vitamins tablet Take 1 tablet by mouth daily. (Patient not taking: Reported on 05/04/2021)   Biotin 2500 MCG CAPS Take by mouth daily. (Patient not taking: Reported on 05/04/2021)   Cholecalciferol (VITAMIN D-3) 25 MCG (1000 UT) CAPS 2 capsule (Patient not taking: Reported on 05/04/2021)   Cyanocobalamin (VITAMIN B12) 1000 MCG TBCR 1 tablet (Patient not taking: Reported on 05/04/2021)   [DISCONTINUED] calcium citrate-vitamin D 200-200 MG-UNIT TABS Take 1 tablet by mouth daily.     No current facility-administered medications on file prior to visit.     Allergies:   Statins, Zetia [ezetimibe], and Zocor [simvastatin]   Social History   Tobacco Use   Smoking status: Never   Smokeless tobacco: Never  Vaping Use   Vaping Use: Never used  Substance Use Topics   Alcohol  use: No    Alcohol/week: 0.0 standard drinks   Drug use: No    Family History: family history includes COPD in her mother; Cancer in her maternal aunt; Diabetes in her maternal aunt and maternal uncle; Heart attack in her mother; Hip fracture in her father; Hypertension in her father. There is no history of Colon cancer.  ROS:   Please see the history of present illness.  Additional pertinent ROS: Constitutional: Negative for chills, fever, night sweats, unintentional weight loss  HENT: Negative for ear pain and hearing loss.   Eyes: Negative for loss of vision and eye pain.  Respiratory: Negative for cough, sputum, wheezing.   Cardiovascular: See HPI. Gastrointestinal: Negative for abdominal pain, melena, and hematochezia.  Genitourinary: Negative for dysuria and hematuria.  Musculoskeletal: Negative for falls and myalgias. Occasional leg cramps while sleeping. Skin: Negative for itching and rash.  Neurological: lingering speech issues post CVA Endo/Heme/Allergies: Does not bruise/bleed easily.     EKGs/Labs/Other Studies Reviewed:    The following studies were reviewed today: Monitor 05/04/21 15 days of data recorded on Zio monitor. Patient had a min HR of 58 bpm, max HR of 70 bpm, and avg HR of 101 bpm. Predominant underlying rhythm was Sinus Rhythm. No VT, SVT, atrial fibrillation, high degree block, or pauses noted. Isolated atrial and ventricular ectopy was rare (<1%). There were 3 triggered events. No significant arrhythmias detected.  Echo 03/08/21 1. Left ventricular ejection fraction, by estimation, is 60 to 65%. The  left ventricle has normal function. The left ventricle has no regional  wall motion abnormalities. There is mild concentric left ventricular  hypertrophy. Left ventricular diastolic  parameters are indeterminate.   2. Right ventricular systolic function is normal. The right ventricular  size is normal. There is normal pulmonary artery systolic pressure. The   estimated right ventricular systolic pressure is 85.6 mmHg.   3. The mitral valve is degenerative. No evidence of mitral valve  regurgitation. No evidence of mitral stenosis. Moderate to severe mitral  annular calcification.   4. The aortic valve is grossly normal. Aortic valve regurgitation is not  visualized. No aortic stenosis is present.   5. The inferior vena cava is normal in size with greater than 50%  respiratory variability, suggesting right atrial pressure of 3 mmHg.   Conclusion(s)/Recommendation(s): No intracardiac source of embolism   EKG:  EKG is personally reviewed.   SR at 92 bpm with PVC  Recent Labs: 01/15/2021: TSH 0.05 03/06/2021: ALT 13 03/12/2021: BUN 29; Creatinine, Ser 1.99; Hemoglobin 12.8; Platelets 270; Potassium 4.5; Sodium 140  Recent Lipid Panel    Component Value Date/Time   CHOL 219 (H) 03/11/2021 0229   CHOL 343 (H) 07/16/2019 1002   CHOL 147 04/16/2013 0820   TRIG 290 (H) 03/11/2021 0229   TRIG 368 (H) 04/21/2016 0812   TRIG 172 (H) 04/16/2013 0820   HDL 38 (L) 03/11/2021 0229   HDL 54 07/16/2019 1002   HDL 46 04/21/2016 0812   HDL 53 04/16/2013 0820   CHOLHDL 5.8 03/11/2021 0229   VLDL 58 (H) 03/11/2021 0229   LDLCALC 123 (H) 03/11/2021 0229   LDLCALC 205 (H) 07/16/2019 1002   LDLCALC 60 04/16/2013 0820   LDLDIRECT 147.9 (H) 03/07/2021 3149    Physical Exam:    VS:  BP 127/86   Pulse 89   Ht 5\' 3"  (1.6 m)   Wt 155 lb 4.8 oz (70.4 kg)   SpO2 97%   BMI 27.51 kg/m     Wt Readings from Last 3 Encounters:  05/04/21 155 lb 4.8 oz (70.4 kg)  03/08/21 156 lb 1.4 oz (70.8 kg)  12/09/20 156 lb (70.8 kg)    GEN: Well nourished, well developed in no acute distress HEENT: Normal, moist mucous membranes NECK: No JVD CARDIAC: regular rhythm, normal S1 and S2, no rubs or gallops. No murmur. VASCULAR: Radial and DP pulses 2+ bilaterally. No carotid bruits RESPIRATORY:  Clear to auscultation without rales, wheezing or rhonchi  ABDOMEN:  Soft, non-tender, non-distended MUSCULOSKELETAL:  Ambulates independently SKIN: Warm and dry, no edema NEUROLOGIC:  Alert and oriented x 3. Mild speech impairment PSYCHIATRIC:  Normal affect    ASSESSMENT:    1. History of CVA (cerebrovascular accident)   2. Encounter to discuss test  results   3. Primary hypertension   4. Hyperlipidemia associated with type 2 diabetes mellitus ([place])   5. Cardiac risk counseling   6. Counseling on health promotion and disease prevention    PLAN:    History of CVA -discussed monitor results today, no evidence of afib -secondary prevention with aspirin, clopidogrel, and rosuvastatin  Hypertension Hyperlipidemia Type II diabetes -as above, secondary ASCVD prevention with aspirin, clopidogrel, and rosuvastatin -on amlodipine, irbesartan, atenolol for hypertension -with history of CVA, there is evidence for GLP1RA. However, she has history of GI issues, including esophageal dysmotility, so this is not a good choice for her at this time  Cardiac risk counseling and prevention recommendations: -recommend heart healthy/Mediterranean diet, with whole grains, fruits, vegetable, fish, lean meats, nuts, and olive oil. Limit salt. -recommend moderate walking, 3-5 times/week for 30-50 minutes each session. Aim for at least 150 minutes.week. Goal should be pace of 3 miles/hours, or walking 1.5 miles in 30 minutes -recommend avoidance of tobacco products. Avoid excess alcohol.  Plan for follow up: I would be happy to see her back as needed  Buford Dresser, MD, PhD, [facility]    Medication Adjustments/Labs and Tests Ordered: Current medicines are reviewed at length with the patient today.  Concerns regarding medicines are outlined above.  Orders Placed This Encounter  Procedures   EKG 12-Lead   No orders of the defined types were placed in this encounter.   Patient Instructions  Medication Instructions:  Your Physician  recommend you continue on your current medication as directed.    *If you need a refill on your cardiac medications before your next appointment, please call your pharmacy*   Lab Work: None ordered today   Testing/Procedures: None ordered today   Follow-Up: At [facility], you and your health needs are our priority.  As part of our continuing mission to provide you with exceptional heart care, we have created designated Provider Care Teams.  These Care Teams include your primary Cardiologist (physician) and Advanced Practice Providers (APPs -  Physician Assistants and Nurse Practitioners) who all work together to provide you with the care you need, when you need it.  We recommend signing up for the patient portal called "MyChart".  Sign up information is provided on this After Visit Summary.  MyChart is used to connect with patients for Virtual Visits (Telemedicine).  Patients are able to view lab/test results, encounter notes, upcoming appointments, etc.  Non-urgent messages can be sent to your provider as well.   To learn more about what you can do with MyChart, go to [email or web address].    Your next appointment:   As needed   The format for your next appointment:   In Person  Provider:   Buford Dresser, MD      Signed, Buford Dresser, MD PhD 05/04/2021 3:35 PM    [place]

## 2021-05-04 NOTE — Patient Instructions (Signed)

## 2021-05-19 ENCOUNTER — Ambulatory Visit (HOSPITAL_COMMUNITY): Payer: Medicare Other | Admitting: Speech Pathology

## 2021-05-20 ENCOUNTER — Ambulatory Visit (HOSPITAL_COMMUNITY): Payer: Medicare Other | Admitting: Occupational Therapy

## 2021-05-26 ENCOUNTER — Encounter (HOSPITAL_COMMUNITY): Payer: Self-pay | Admitting: Speech Pathology

## 2021-05-26 ENCOUNTER — Ambulatory Visit (HOSPITAL_COMMUNITY): Payer: Medicare Other | Attending: Family | Admitting: Speech Pathology

## 2021-05-26 ENCOUNTER — Other Ambulatory Visit: Payer: Self-pay

## 2021-05-26 DIAGNOSIS — R29818 Other symptoms and signs involving the nervous system: Secondary | ICD-10-CM | POA: Insufficient documentation

## 2021-05-26 DIAGNOSIS — R41841 Cognitive communication deficit: Secondary | ICD-10-CM | POA: Diagnosis present

## 2021-05-26 DIAGNOSIS — R4701 Aphasia: Secondary | ICD-10-CM | POA: Insufficient documentation

## 2021-05-26 DIAGNOSIS — R278 Other lack of coordination: Secondary | ICD-10-CM | POA: Insufficient documentation

## 2021-05-26 NOTE — Therapy (Signed)
[place], [place], [address] Phone: [phone]   Fax:  [phone]  Speech Language Pathology Evaluation  Patient Details  Name: Lisa Sutton MRN: [id number] Date of Birth: 29-Apr-1934 Referring Provider (SLP): Etter Sjogren, [facility]   Encounter Date: 05/26/2021   End of Session - 05/26/21 1720     Visit Number 1    Number of Visits 10    Date for SLP Re-Evaluation 07/09/21    Authorization Type UHC Medicare   [employer] Med 05/24/2021-05/23/2022  OOP 3,600  co-pay 20  no DED, no visit limit, no auth   SLP Start Time 1607    SLP Stop Time  1653    SLP Time Calculation (min) 46 min    Activity Tolerance Patient tolerated treatment well             Past Medical History:  Diagnosis Date   Anxiety    Arthritis    Cataract    Dysphagia    'sometimes but not a major issue' been checked out by GI (per pt)   Dysrhythmia    'heart used to skip but doesn't anymore' was checked out by Dr. Ron Parker late '90s, everything checked out ok and not had any skipping since (all per pt)   Essential hypertension, benign    Fatty liver    GERD (gastroesophageal reflux disease)    H/O hypercalcemia 04/12/2017   Hepatitis B    had at age 63, 'GI doc said it's gone away'   Hepatitis B surface antigen positive    High cholesterol    History of hiatal hernia    Hypercalcemia    Hypothyroidism    Kidney stone August 2014   Patient was seen at [facility]   Obesity    Osteoporosis    PONV (postoperative nausea and vomiting)    Type 2 diabetes mellitus ([place])     Past Surgical History:  Procedure Laterality Date   ABDOMINAL HYSTERECTOMY     BILATERAL CARPAL TUNNEL RELEASE     Dr. Marlou Sa at surgical center   BIOPSY  05/26/2017   Procedure: BIOPSY;  Surgeon: Rogene Houston, MD;  [facility];  Service: Endoscopy;;  colon   BIOPSY  10/11/2018   Procedure: BIOPSY;  Surgeon: Rogene Houston, MD;  [facility];   Service: Endoscopy;;   CATARACT EXTRACTION, BILATERAL     COLONOSCOPY N/A 05/26/2017   Procedure: COLONOSCOPY;  Surgeon: Rogene Houston, MD;  [facility];  Service: Endoscopy;  Laterality: N/A;  10:30   Deviated septum repair     ESOPHAGEAL DILATION N/A 08/16/2019   Procedure: ESOPHAGEAL DILATION;  Surgeon: Rogene Houston, MD;  [facility];  Service: Endoscopy;  Laterality: N/A;   ESOPHAGOGASTRODUODENOSCOPY N/A 07/28/2016   Procedure: ESOPHAGOGASTRODUODENOSCOPY (EGD);  Surgeon: Rogene Houston, MD;  [facility];  Service: Endoscopy;  Laterality: N/A;  300 - moved to 3/7 @ 1:00   ESOPHAGOGASTRODUODENOSCOPY N/A 08/16/2019   Procedure: ESOPHAGOGASTRODUODENOSCOPY (EGD);  Surgeon: Rogene Houston, MD;  [facility];  Service: Endoscopy;  Laterality: N/A;  [facility] N/A 10/11/2018   Procedure: FLEXIBLE SIGMOIDOSCOPY;  Surgeon: Rogene Houston, MD;  [facility];  Service: Endoscopy;  Laterality: N/A;  unable to complete colonoscopy    IR CT HEAD LTD  03/07/2021   IR PERCUTANEOUS ART THROMBECTOMY/INFUSION INTRACRANIAL INC DIAG ANGIO  03/07/2021   IR US GUIDE VASC ACCESS RIGHT  03/07/2021   KNEE ARTHROPLASTY Left 09/15/2015   Procedure: COMPUTER ASSISTED TOTAL KNEE ARTHROPLASTY;  Surgeon: Marybelle Killings, MD;  Location: [place];  Service: Orthopedics;  Laterality: Left;   KNEE ARTHROSCOPY     left knee 2003   RADIOLOGY WITH ANESTHESIA N/A 03/07/2021   Procedure: IR WITH ANESTHESIA;  Surgeon: Corrie Mckusick, DO;  Location: [place];  Service: Anesthesiology;  Laterality: N/A;    There were no vitals filed for this visit.   Subjective Assessment - 05/26/21 1715     Subjective "Try to get better."    Patient is accompained by: Family member   Son, [place] Screening Tool (MAST)    Currently in Pain? No/denies                SLP Evaluation [facility] - 05/26/21 1742       SLP  Visit Information   SLP Received On 05/26/21    Referring Provider (SLP) Etter Sjogren, [facility]    Onset Date 03/06/2021    Medical Diagnosis CVA      Subjective   Subjective "Try to get better."    Patient/Family Stated Goal Improve communication      Pain Assessment   Currently in Pain? No/denies      General Information   HPI Lisa Sutton is a 88 y.o. female with PMH significant for HTN, GERD, Hep B, HLD, Obesity, osteoporosis who initially presented to [facility] ED with aphasia.  CT angio was done which showed left M2 occlusion, she was brought to [facility], underwent mechanical thrombectomy. MRI was remarkable for acute infarcts in the L MCA territory and right parietal cortex.  Pt was intubated less than 24 hrs on 10/15.  Pt has a hx of esophageal dysphagia. She received SLP therapy at [facility] during acute admission, [facility] for 2 weeks SNF, and was discharged from home health SLP services last week. She was referred for outpatient SLP therapy by Etter Sjogren, PA-C.    Behavioral/Cognition alert and cooperative    Mobility Status ambulatory      Balance Screen   Has the patient fallen in the past 6 months No    Has the patient had a decrease in activity level because of a fear of falling?  No    Is the patient reluctant to leave their home because of a fear of falling?  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    Self Correcting Impaired    Self Correcting Impairment Verbal complex;Functional complex    Behaviors Perseveration      Auditory Comprehension   Overall Auditory Comprehension Impaired    Yes/No Questions Impaired  Basic Biographical Questions 76-100% accurate    Complex Questions 75-100% accurate    Commands Impaired    Two Step Basic Commands 75-100% accurate    Multistep Basic Commands 25-49% accurate    Conversation Simple    EffectiveTechniques Repetition;Visual/Gestural cues      Visual Recognition/Discrimination   Discrimination Within Function Limits   decreased attention to right vs visual field deficit     Reading Comprehension   Reading Status Impaired    Word level --   needs further assessment   Sentence Level 51-75% accurate    Paragraph Level Not tested    Functional Environmental (signs, name badge) Within functional limits    Interfering Components Visual scanning    Effective Techniques Visual cueing      Expression   Primary Mode of Expression Verbal      Verbal Expression   Overall Verbal Expression Impaired    Initiation No impairment    Automatic Speech Name;Counting;Social Response;Day of week    Level of Generative/Spontaneous Verbalization Sentence    Repetition Impaired    Level of Impairment Word level    Naming Impairment    Responsive 51-75% accurate    Confrontation 25-49% accurate    Convergent Not tested    Divergent Not tested    Verbal Errors Phonemic paraphasias;Neologisms;Perseveration;Inconsistent;Aware of errors;Not aware of errors    Pragmatics No impairment    Effective Techniques --   silent phonetic gesture   Non-Verbal Means of Communication Gestures      Written Expression   Dominant Hand Right    Written Expression Exceptions to [facility]    Copy Ability Letter      Oral Motor/Sensory Function   Overall Oral Motor/Sensory Function Appears  within functional limits for tasks assessed      Motor Speech   Overall Motor Speech Impaired   will further assess for apraxia   Respiration Within functional limits    Phonation Normal    Resonance Within functional limits    Articulation Within functional limitis    Intelligibility Intelligibility reduced    Motor Planning Impaired    Level of Impairment Word    Motor Speech Errors Groping for words;Inconsistent    Phonation WFL      Standardized Assessments   Standardized Assessments  Other Assessment   [place] Aphasia Screening Tool (MAST)           MAST Pt was administered the [place] Aphasia Screening Tool (MAST) and achieved an overall expressive score of 24/50, receptive score of 34/50, and a total score of 58/100.  Expressive Index Naming 4/10 Automatic Speech 6/10 Repetition 4/10 Writing 0/10 Verbal Fluency 10/10 Expressive Subscale 24/50  Receptive Index Yes/No Accuracy 16/20 Object Recognition 10/10 Following Instructions 6/10 Reading Instructions 2/10 Receptive Subscale 34/50  Total Index Expressive 24/50 Receptive 34/50 Total Score 58/100    05/26/21 1645  SLP SHORT TERM GOAL #1  Title Pt will describe objects and pictures by providing at least three salient features as judged by clinician with 80% acc when provided mi/mod cues.  Baseline 70%  Time 4  Period Weeks  Status New  Target Date 06/29/21  SLP SHORT TERM GOAL #2  Title Pt will implement word-finding strategies with 80% accuracy when unable to verbalize desired word in conversation/functional tasks with mod assist.  Baseline 65%  Time 4  Period Weeks  Status New  Target Date 06/29/21  SLP SHORT TERM GOAL #3  Title Pt will complete reading comprehension tasks (short phrases) with  80% acc and min assist via multiple choice response.  Baseline max assist  Time 4  Period Weeks  Status New  Target Date 06/29/21  SLP SHORT TERM GOAL #4  Title Pt will respond to open-ended  questions with 80% acc and min/mod cues with use of personalized communication template (personally relevant vocabulary).  Baseline Will create personalized communication template over the first three sessions to use going forward  Time 4  Period Weeks  Status New  Target Date 06/29/21  SLP SHORT TERM GOAL #5  Title Pt will repeat short functional phrases/sentences with 80% acc when provided mi/mod cues for error awareness.  Baseline 40%  Time 4  Period Weeks  Status New  Target Date 06/29/21     Plan - 05/26/21 1742     Clinical Impression Statement Pt presents with moderate expressive aphasia and mi/mod receptive aphasia characterized by moderate semi fluent aphasia with mod/severe naming/word finding impairment, difficulty repeating single words, fair ability to complete automatic speech tasks, poor reading/decoding, and moderate auditory comprehension impairment with moderately complex language and instructions. Pt also with right inattention with difficulty locating words and pictures on her right side and reduced ability to incorporate her right hand in functional table top tasks. Pt will benefit from skilled SLP in order to address the above impairments, maximize independence, and decrease burden of care. She is highly motivated to improve her communication skills as she was independent prior to this stroke and has good family support.     Speech Therapy Frequency 2x / week    Duration 8 weeks    Treatment/Interventions Compensatory strategies;Cueing hierarchy;Patient/family education;Functional tasks;Multimodal communcation approach;SLP instruction and feedback;Language facilitation    Potential to Achieve Goals Good    SLP Home Exercise Plan Pt will completed HEP as assigned to facilitate carryover of treatment strategies and techniques in home environment with use of written cues as needed.    Consulted and Agree with Plan of Care Patient;Family member/caregiver    Family Member  Lisa Sutton Son, [place]             Patient will benefit from skilled therapeutic intervention in order to improve the following deficits and impairments:   Aphasia  Cognitive communication deficit    Problem List Patient Active Problem List   Diagnosis Date Noted   Hypertensive emergency    Acute ischemic left MCA stroke ([address]) 03/07/2021   Cerebrovascular accident (CVA) ([place])    Encounter for intubation    Pruritus ani 08/05/2020   History of hepatitis B 02/05/2020   Chronic diarrhea 07/24/2019   Proteinuria 07/16/2019   GAD (generalized anxiety disorder) 03/02/2019   Controlled substance agreement signed 03/02/2019   Hyperlipidemia associated with type 2 diabetes mellitus ([place]) 02/19/2019   CKD stage 3 due to type 2 diabetes mellitus ([place]) 02/19/2019   Primary hypertension 02/19/2019   Elevated alkaline phosphatase level 01/15/2019   Stage 3 chronic kidney disease ([place]) 06/15/2017   History of colonic polyps 05/09/2017   Presbycusis of both ears 06/04/2016   Hypothyroidism due to acquired atrophy of thyroid 04/26/2016   Vitamin D deficiency 04/26/2016   Status post total left knee replacement 09/15/2015   Statin intolerance 09/11/2015   Diabetic neuropathy, type II diabetes mellitus ([place]) 07/30/2015   Abnormal chest x-ray 11/27/2014   Primary insomnia 04/12/2013   GERD (gastroesophageal reflux disease) 04/25/2012   Fatty liver 08/09/2011   Esophageal dysphagia 08/09/2011   Type 2 diabetes mellitus with diabetic nephropathy, without long-term current use of insulin ([place])  08/09/2011   Thank you,  Genene Churn, [place]  Genene Churn, [place] 05/26/2021, 6:06 PM  [place] [address] [place], [place], [address] Phone: [phone]   Fax:  [phone]  Name: BENIGNA DELISI MRN: [id number] Date of Birth: Dec 09, 1933

## 2021-05-28 ENCOUNTER — Ambulatory Visit (HOSPITAL_COMMUNITY): Payer: Medicare Other | Admitting: Occupational Therapy

## 2021-05-28 ENCOUNTER — Other Ambulatory Visit: Payer: Self-pay

## 2021-05-28 ENCOUNTER — Encounter (HOSPITAL_COMMUNITY): Payer: Self-pay | Admitting: Occupational Therapy

## 2021-05-28 DIAGNOSIS — R29818 Other symptoms and signs involving the nervous system: Secondary | ICD-10-CM

## 2021-05-28 DIAGNOSIS — R4701 Aphasia: Secondary | ICD-10-CM | POA: Diagnosis not present

## 2021-05-28 DIAGNOSIS — R278 Other lack of coordination: Secondary | ICD-10-CM

## 2021-05-28 NOTE — Therapy (Signed)
[place], [place], [address] Phone: [phone]   Fax:  [phone]  Occupational Therapy Evaluation  Patient Details  Name: Lisa Sutton MRN: [id number] Date of Birth: 05/15/1934 Referring Provider (OT): Etter Sjogren, NP   Encounter Date: 05/28/2021   OT End of Session - 05/28/21 1304     Visit Number 1    Number of Visits 6    Date for OT Re-Evaluation 06/19/21    Authorization Type UHC Medicare, $20 copay    Progress Note Due on Visit 10    OT Start Time 1120    OT Stop Time 1202    OT Time Calculation (min) 42 min    Activity Tolerance Patient tolerated treatment well    Behavior During Therapy [facility] for tasks assessed/performed             Past Medical History:  Diagnosis Date   Anxiety    Arthritis    Cataract    Dysphagia    'sometimes but not a major issue' been checked out by GI (per pt)   Dysrhythmia    'heart used to skip but doesn't anymore' was checked out by Dr. Ron Parker late '90s, everything checked out ok and not had any skipping since (all per pt)   Essential hypertension, benign    Fatty liver    GERD (gastroesophageal reflux disease)    H/O hypercalcemia 04/12/2017   Hepatitis B    had at age 66, 'GI doc said it's gone away'   Hepatitis B surface antigen positive    High cholesterol    History of hiatal hernia    Hypercalcemia    Hypothyroidism    Kidney stone August 2014   Patient was seen at [facility]   Obesity    Osteoporosis    PONV (postoperative nausea and vomiting)    Type 2 diabetes mellitus ([place])     Past Surgical History:  Procedure Laterality Date   ABDOMINAL HYSTERECTOMY     BILATERAL CARPAL TUNNEL RELEASE     Dr. Marlou Sa at surgical center   BIOPSY  05/26/2017   Procedure: BIOPSY;  Surgeon: Rogene Houston, MD;  [facility];  Service: Endoscopy;;  colon   BIOPSY  10/11/2018   Procedure: BIOPSY;  Surgeon: Rogene Houston, MD;  [facility];  Service: Endoscopy;;   CATARACT EXTRACTION, BILATERAL     COLONOSCOPY N/A 05/26/2017   Procedure: COLONOSCOPY;  Surgeon: Rogene Houston, MD;  [facility];  Service: Endoscopy;  Laterality: N/A;  10:30   Deviated septum repair     ESOPHAGEAL DILATION N/A 08/16/2019   Procedure: ESOPHAGEAL DILATION;  Surgeon: Rogene Houston, MD;  [facility];  Service: Endoscopy;  Laterality: N/A;   ESOPHAGOGASTRODUODENOSCOPY N/A 07/28/2016   Procedure: ESOPHAGOGASTRODUODENOSCOPY (EGD);  Surgeon: Rogene Houston, MD;  [facility];  Service: Endoscopy;  Laterality: N/A;  300 - moved to 3/7 @ 1:00   ESOPHAGOGASTRODUODENOSCOPY N/A 08/16/2019   Procedure: ESOPHAGOGASTRODUODENOSCOPY (EGD);  Surgeon: Rogene Houston, MD;  [facility];  Service: Endoscopy;  Laterality: N/A;  [place] N/A 10/11/2018   Procedure: FLEXIBLE SIGMOIDOSCOPY;  Surgeon: Rogene Houston, MD;  [facility];  Service: Endoscopy;  Laterality: N/A;  unable to complete colonoscopy    IR CT HEAD LTD  03/07/2021   IR PERCUTANEOUS ART THROMBECTOMY/INFUSION INTRACRANIAL INC DIAG  ANGIO  03/07/2021   IR US GUIDE VASC ACCESS RIGHT  03/07/2021   KNEE ARTHROPLASTY Left 09/15/2015   Procedure: COMPUTER ASSISTED TOTAL KNEE ARTHROPLASTY;  Surgeon: Marybelle Killings, MD;  Location: [place];  Service: Orthopedics;  Laterality: Left;   KNEE ARTHROSCOPY     left knee 2003   RADIOLOGY WITH ANESTHESIA N/A 03/07/2021   Procedure: IR WITH ANESTHESIA;  Surgeon: Corrie Mckusick, DO;  Location: [place];  Service: Anesthesiology;  Laterality: N/A;    There were no vitals filed for this visit.   Subjective Assessment - 05/28/21 1253     Subjective  S: I can't do anything with this hand.    Pertinent History Pt is an 88 y/o female s/p left MCA CVA on 03/05/21 presenting for evaluation of RUE deficits. Pt received acute rehab services at [facility], transferred to SNF for 2 weeks, then  received some [place] OT. Pt was referred to occupational therapy for evaluation and treatment by Etter Sjogren, NP.    Patient Stated Goals To have more use of the right hand.    Currently in Pain? No/denies               [facility] OT Assessment - 05/28/21 1119       Assessment   Medical Diagnosis s/p left MCA CVA & thrombectomy    Referring Provider (OT) Etter Sjogren, NP    Onset Date/Surgical Date 03/05/21    Hand Dominance Right    Next MD Visit 08/04/21    Prior Therapy SNF x2 weeks, HH services      Precautions   Precautions None      Restrictions   Weight Bearing Restrictions No      Balance Screen   Has the patient fallen in the past 6 months No    Has the patient had a decrease in activity level because of a fear of falling?  No    Is the patient reluctant to leave their home because of a fear of falling?  No      Prior Function   Level of Independence Independent    Vocation Retired    Leisure silver sneakers 2x/week, errands, playing golf      ADL   ADL comments Pt is having difficulty with using the right dominant hand for holding things, operating tools such as utensils during self-feeding. Pt is not able to perform meal preparation tasks.      Written Expression   Dominant Hand Right      Vision Assessment   Eye Alignment Within Functional Limits    Ocular Range of Motion Within Functional Limits    Alignment/Gaze Preference Within Defined Limits    Tracking/Visual Pursuits Able to track stimulus in all quads without difficulty    Saccades Within functional limits    Convergence Within functional limits    Visual Fields No apparent deficits    Comment E&R cancellation test completed, pt only scanning left side of paper 75% of the time, mixing up what letters she was scanning for frequently      Sensation   Semmes Weinstein Monofilament Scale Diminished Protective   volar hand; dorsal hand at 4.56-loss of protective     Coordination   9 Hole Peg Test  Right;Left    Right 9 Hole Peg Test 1'04"    Left 9 Hole Peg Test 41"      ROM / Strength   AROM / PROM / Strength Strength      Strength  Overall Strength Comments RUE strength is 5/5 thorughout    Strength Assessment Site Hand    Right/Left hand Right;Left    Right Hand Gross Grasp Functional    Right Hand Grip (lbs) 36    Right Hand Lateral Pinch 8 lbs    Right Hand 3 Point Pinch 8 lbs    Left Hand Gross Grasp Functional    Left Hand Grip (lbs) 40    Left Hand Lateral Pinch 10 lbs    Left Hand 3 Point Pinch 8 lbs                              OT Education - 05/28/21 1303     Education Details educated on findings and encouraged use of RUE during daily tasks to improve motor planning    Person(s) Educated Patient;Child(ren)   son   Methods Explanation    Comprehension Verbalized understanding              OT Short Term Goals - 05/28/21 1404       OT SHORT TERM GOAL #1   Title Pt will be provided with and educated on HEP to increase use of dominant RUE during ADL completion.    Time 3    Period Weeks    Status New    Target Date 06/19/21      OT SHORT TERM GOAL #2   Title Pt will increase right fine motor coordination required for self-feeding by completing 9 hole peg test in under 50".    Time 3    Period Weeks    Status New      OT SHORT TERM GOAL #3   Title Pt will be educated on use of AE available to compensate for sensation deficits when attempting to use RUE during ADLs.    Time 3    Period Weeks    Status New                      Plan - 05/28/21 1305     Clinical Impression Statement A: Pt is an 88 y/o female s/p left MCA CVA on 03/05/21 presenting with RUE deficits in sensation, coordination, and functional use. Pt presents with good strength in the left hand, coordination and sensation are primary concerns for functional use. Also assessed vision which appears [facility] on formal testing, right neglect/inattention  impacting volitional use of right hand during functional tasks.    OT Occupational Profile and History Problem Focused Assessment - Including review of records relating to presenting problem    Occupational performance deficits (Please refer to evaluation for details): ADL's;IADL's;Leisure    Body Structure / Function / Physical Skills ADL;Endurance;UE functional use;Proprioception;Coordination;IADL    Rehab Potential Good    Clinical Decision Making Limited treatment options, no task modification necessary    Comorbidities Affecting Occupational Performance: None    Modification or Assistance to Complete Evaluation  No modification of tasks or assist necessary to complete eval    OT Frequency 2x / week    OT Duration --   3 weeks   OT Treatment/Interventions Self-care/ADL training;DME and/or AE instruction;Patient/family education;Coping strategies training;Therapeutic exercise;Therapeutic activities    Plan P: Pt will benefit from skilled OT services to improve RUE functioning required for use during ADL tasks. Treatment plan: fine motor coordination tasks, AE training, ADL training    Consulted and Agree with Plan of Care Patient;Family member/caregiver  Family Member Consulted son             Patient will benefit from skilled therapeutic intervention in order to improve the following deficits and impairments:   Body Structure / Function / Physical Skills: ADL, Endurance, UE functional use, Proprioception, Coordination, IADL       Visit Diagnosis: Other lack of coordination  Other symptoms and signs involving the nervous system    Problem List Patient Active Problem List   Diagnosis Date Noted   Hypertensive emergency    Acute ischemic left MCA stroke ([place]) 03/07/2021   Cerebrovascular accident (CVA) ([place])    Encounter for intubation    Pruritus ani 08/05/2020   History of hepatitis B 02/05/2020   Chronic diarrhea 07/24/2019   Proteinuria 07/16/2019   GAD  (generalized anxiety disorder) 03/02/2019   Controlled substance agreement signed 03/02/2019   Hyperlipidemia associated with type 2 diabetes mellitus ([place]) 02/19/2019   CKD stage 3 due to type 2 diabetes mellitus ([place]) 02/19/2019   Primary hypertension 02/19/2019   Elevated alkaline phosphatase level 01/15/2019   Stage 3 chronic kidney disease ([place]) 06/15/2017   History of colonic polyps 05/09/2017   Presbycusis of both ears 06/04/2016   Hypothyroidism due to acquired atrophy of thyroid 04/26/2016   Vitamin D deficiency 04/26/2016   Status post total left knee replacement 09/15/2015   Statin intolerance 09/11/2015   Diabetic neuropathy, type II diabetes mellitus ([place]) 07/30/2015   Abnormal chest x-ray 11/27/2014   Primary insomnia 04/12/2013   GERD (gastroesophageal reflux disease) 04/25/2012   Fatty liver 08/09/2011   Esophageal dysphagia 08/09/2011   Type 2 diabetes mellitus with diabetic nephropathy, without long-term current use of insulin ([place]) 08/09/2011    Guadelupe Sabin, OTR/L  [phone] 05/28/2021, 2:06 PM  [place], [place], [address] Phone: [phone]   Fax:  [phone]  Name: KAELI NICHELSON MRN: [id number] Date of Birth: 11-15-1933

## 2021-06-04 ENCOUNTER — Ambulatory Visit (HOSPITAL_COMMUNITY): Payer: Medicare Other

## 2021-06-04 ENCOUNTER — Ambulatory Visit (HOSPITAL_COMMUNITY): Payer: Medicare Other | Admitting: Speech Pathology

## 2021-06-04 ENCOUNTER — Other Ambulatory Visit: Payer: Self-pay

## 2021-06-04 ENCOUNTER — Encounter (HOSPITAL_COMMUNITY): Payer: Self-pay | Admitting: Speech Pathology

## 2021-06-04 DIAGNOSIS — R41841 Cognitive communication deficit: Secondary | ICD-10-CM

## 2021-06-04 DIAGNOSIS — R29818 Other symptoms and signs involving the nervous system: Secondary | ICD-10-CM

## 2021-06-04 DIAGNOSIS — R278 Other lack of coordination: Secondary | ICD-10-CM

## 2021-06-04 DIAGNOSIS — R4701 Aphasia: Secondary | ICD-10-CM

## 2021-06-04 NOTE — Therapy (Signed)
Lisa [place], [place], [address] Phone: [phone]   Fax:  [phone]  Occupational Therapy Treatment  Patient Details  Name: Lisa Sutton MRN: [id number] Date of Birth: 31-Dec-1933 Referring Provider (OT): Etter Sjogren, NP   Encounter Date: 06/04/2021   OT End of Session - 06/04/21 1129     Visit Number 2    Number of Visits 6    Date for OT Re-Evaluation 06/19/21    Authorization Type UHC Medicare, $20 copay    Progress Note Due on Visit 10    OT Start Time 1118    OT Stop Time 1200    OT Time Calculation (min) 42 min    Activity Tolerance Patient tolerated treatment well    Behavior During Therapy [facility] for tasks assessed/performed             Past Medical History:  Diagnosis Date   Anxiety    Arthritis    Cataract    Dysphagia    'sometimes but not a major issue' been checked out by GI (per pt)   Dysrhythmia    'heart used to skip but doesn't anymore' was checked out by Dr. Ron Sutton late '90s, everything checked out ok and not had any skipping since (all per pt)   Essential hypertension, benign    Fatty liver    GERD (gastroesophageal reflux disease)    H/O hypercalcemia 04/12/2017   Hepatitis B    had at age 67, 'GI doc said it's gone away'   Hepatitis B surface antigen positive    High cholesterol    History of hiatal hernia    Hypercalcemia    Hypothyroidism    Kidney stone August 2014   Patient was seen at [facility]   Obesity    Osteoporosis    PONV (postoperative nausea and vomiting)    Type 2 diabetes mellitus ([place])     Past Surgical History:  Procedure Laterality Date   ABDOMINAL HYSTERECTOMY     BILATERAL CARPAL TUNNEL RELEASE     Dr. Marlou Sa at surgical center   BIOPSY  05/26/2017   Procedure: BIOPSY;  Surgeon: Rogene Houston, MD;  [facility];  Service: Endoscopy;;  colon   BIOPSY  10/11/2018   Procedure: BIOPSY;  Surgeon: Rogene Houston, MD;  [facility];  Service: Endoscopy;;   CATARACT EXTRACTION, BILATERAL     COLONOSCOPY Sutton/A 05/26/2017   Procedure: COLONOSCOPY;  Surgeon: Rogene Houston, MD;  [facility];  Service: Endoscopy;  Laterality: Sutton/A;  10:30   Deviated septum repair     ESOPHAGEAL DILATION Sutton/A 08/16/2019   Procedure: ESOPHAGEAL DILATION;  Surgeon: Rogene Houston, MD;  [facility];  Service: Endoscopy;  Laterality: Sutton/A;   ESOPHAGOGASTRODUODENOSCOPY Sutton/A 07/28/2016   Procedure: ESOPHAGOGASTRODUODENOSCOPY (EGD);  Surgeon: Rogene Houston, MD;  [facility];  Service: Endoscopy;  Laterality: Sutton/A;  300 - moved to 3/7 @ 1:00   ESOPHAGOGASTRODUODENOSCOPY Sutton/A 08/16/2019   Procedure: ESOPHAGOGASTRODUODENOSCOPY (EGD);  Surgeon: Rogene Houston, MD;  [facility];  Service: Endoscopy;  Laterality: Sutton/A;  Lisa Sutton/A 10/11/2018   Procedure: FLEXIBLE SIGMOIDOSCOPY;  Surgeon: Rogene Houston, MD;  [facility];  Service: Endoscopy;  Laterality: Sutton/A;  unable to complete colonoscopy    IR CT HEAD LTD  03/07/2021   IR PERCUTANEOUS ART THROMBECTOMY/INFUSION INTRACRANIAL INC DIAG  ANGIO  03/07/2021   IR US GUIDE VASC ACCESS RIGHT  03/07/2021   KNEE ARTHROPLASTY Left 09/15/2015   Procedure: COMPUTER ASSISTED TOTAL KNEE ARTHROPLASTY;  Surgeon: Marybelle Killings, MD;  Location: [place];  Service: Orthopedics;  Laterality: Left;   KNEE ARTHROSCOPY     left knee 2003   RADIOLOGY WITH ANESTHESIA Sutton/A 03/07/2021   Procedure: IR WITH ANESTHESIA;  Surgeon: Corrie Mckusick, DO;  Location: [place];  Service: Anesthesiology;  Laterality: Sutton/A;    There were no vitals filed for this visit.   Subjective Assessment - 06/04/21 1125     Subjective  S: I am hard of hearing.    Currently in Pain? No/denies                [facility] OT Assessment - 06/04/21 1128       Assessment   Medical Diagnosis s/p left MCA CVA & thrombectomy      Precautions   Precautions None                       OT Treatments/Exercises (OP) - 06/04/21 1128       Exercises   Exercises Hand      Visual/Perceptual Exercises   Letter Search E&R cancellation worksheet completed.      Fine Motor Coordination (Hand/Wrist)   Fine Motor Coordination Flipping cards;Dealing card with thumb;Picking up coins;Manipulating coins;Stacking coins;Handwriting    Flipping cards using a deck of cards, patient flipped one card at a time.    Dealing card with thumb Pt dealt one card at a time using her right thumb. max difficulty    Picking up coins Right hand used to pick up coins one at a time up to 3 coins.    Manipulating coins Able to move coins from fingertip to palm and back to fingertips to stack.    Stacking coins Pt stacked 3 coins into a tower.    Handwriting Name and address writing completed. See media tab.                    OT Education - 06/04/21 1125     Education Details reviewed therapy goals. Provided coordination HEP.    Person(s) Educated Patient    Methods Explanation;Handout;Demonstration    Comprehension Verbalized understanding;Returned demonstration              OT Short Term Goals - 06/04/21 1129       OT SHORT TERM GOAL #1   Title Pt will be provided with and educated on HEP to increase use of dominant RUE during ADL completion.    Time 3    Period Weeks    Status On-going    Target Date 06/19/21      OT SHORT TERM GOAL #2   Title Pt will increase right fine motor coordination required for self-feeding by completing 9 hole peg test in under 50".    Time 3    Period Weeks    Status On-going      OT SHORT TERM GOAL #3   Title Pt will be educated on use of AE available to compensate for sensation deficits when attempting to use RUE during ADLs.    Time 3    Period Weeks    Status On-going                      Plan - 06/04/21 1255     Clinical Impression Statement  A: Reviewed therapy goals. Focused on  coordination tasks during session. 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 See media tab for handwriting sample and E&R cancellation re-test. HEP was updated with coordination activities. Due to apraxia, patient required increased time, verbal cues, visual cues, and tactile cues to complete a task correctly.    Body Structure / Function / Physical Skills ADL;Endurance;UE functional use;Proprioception;Coordination;IADL    Plan P: If available provide written education for apraxia management. Continue with coordination training.    Consulted and Agree with Plan of Care Patient             Patient will benefit from skilled therapeutic intervention in order to improve the following deficits and impairments:   Body Structure / Function / Physical Skills: ADL, Endurance, UE functional use, Proprioception, Coordination, IADL       Visit Diagnosis: Other lack of coordination  Other symptoms and signs involving the nervous system    Problem List Patient Active Problem List   Diagnosis Date Noted   Hypertensive emergency    Acute ischemic left MCA stroke ([place]) 03/07/2021   Cerebrovascular accident (CVA) ([place])    Encounter for intubation    Pruritus ani 08/05/2020   History of hepatitis B 02/05/2020   Chronic diarrhea 07/24/2019   Proteinuria 07/16/2019   GAD (generalized anxiety disorder) 03/02/2019   Controlled substance agreement signed 03/02/2019   Hyperlipidemia associated with type 2 diabetes mellitus ([place]) 02/19/2019   CKD stage 3 due to type 2 diabetes mellitus ([place]) 02/19/2019   Primary hypertension 02/19/2019   Elevated alkaline phosphatase level 01/15/2019   Stage 3 chronic kidney disease ([place]) 06/15/2017   History of colonic polyps 05/09/2017   Presbycusis of both ears 06/04/2016   Hypothyroidism due to acquired atrophy of thyroid 04/26/2016   Vitamin D deficiency 04/26/2016   Status post total left knee replacement  09/15/2015   Statin intolerance 09/11/2015   Diabetic neuropathy, type II diabetes mellitus ([place]) 07/30/2015   Abnormal chest x-ray 11/27/2014   Primary insomnia 04/12/2013   GERD (gastroesophageal reflux disease) 04/25/2012   Fatty liver 08/09/2011   Esophageal dysphagia 08/09/2011   Type 2 diabetes mellitus with diabetic nephropathy, without long-term current use of insulin [facility]) 08/09/2011    Ailene Ravel, OTR/L,CBIS  [phone]  06/04/2021, 12:59 PM  [place], [place], [address] Phone: [phone]   Fax:  [phone]  Name: RHIANNA RAULERSON MRN: [id number] Date of Birth: 21-Sep-1933

## 2021-06-04 NOTE — Patient Instructions (Addendum)
°  Coordination Activities  Perform the following activities for 10-15 minutes 2-3 times per day with right hand(s).  Flip cards 1 at a time as fast as you can. Deal cards with your thumb (Hold deck in hand and push card off top with thumb). Rotate card in hand (clockwise and counter-clockwise). *Complete 2-3 rotations each direction. Pick up coins, buttons, marbles, dried beans/pasta of different sizes and place in container. Pick up coins and place in container or coin bank. Pick up coins and stack. Pick up coins one at a time until you get 3-5 in your hand, then move coins from palm to fingertips to stack one at a time. Practice writing.*Ideas: Write your name and/or address 5-10 times each day. [place], [place] [address]

## 2021-06-04 NOTE — Therapy (Signed)
[place], [place], [address] Phone: [phone]   Fax:  [phone]  Speech Language Pathology Treatment  Patient Details  Name: Lisa Sutton MRN: [id number] Date of Birth: 01/16/1934 Referring Provider (SLP): Etter Sjogren, [facility]   Encounter Date: 06/04/2021   End of Session - 06/04/21 1719     Visit Number 2    Number of Visits 10    Date for SLP Re-Evaluation 07/09/21    Authorization Type UHC Medicare   UHC Med 05/24/2021-05/23/2022  OOP 3,600  co-pay 20  no DED, no visit limit, no auth   SLP Start Time [phone]    SLP Stop Time  1040    SLP Time Calculation (min) 45 min    Activity Tolerance Patient tolerated treatment well             Past Medical History:  Diagnosis Date   Anxiety    Arthritis    Cataract    Dysphagia    'sometimes but not a major issue' been checked out by GI (per pt)   Dysrhythmia    'heart used to skip but doesn't anymore' was checked out by Dr. Ron Parker late '90s, everything checked out ok and not had any skipping since (all per pt)   Essential hypertension, benign    Fatty liver    GERD (gastroesophageal reflux disease)    H/O hypercalcemia 04/12/2017   Hepatitis B    had at age 51, 'GI doc said it's gone away'   Hepatitis B surface antigen positive    High cholesterol    History of hiatal hernia    Hypercalcemia    Hypothyroidism    Kidney stone August 2014   Patient was seen at [facility]   Obesity    Osteoporosis    PONV (postoperative nausea and vomiting)    Type 2 diabetes mellitus ([place])     Past Surgical History:  Procedure Laterality Date   ABDOMINAL HYSTERECTOMY     BILATERAL CARPAL TUNNEL RELEASE     Dr. Marlou Sa at surgical center   BIOPSY  05/26/2017   Procedure: BIOPSY;  Surgeon: Rogene Houston, MD;  [facility];  Service: Endoscopy;;  colon   BIOPSY  10/11/2018   Procedure: BIOPSY;  Surgeon: Rogene Houston, MD;  [facility];   Service: Endoscopy;;   CATARACT EXTRACTION, BILATERAL     COLONOSCOPY N/A 05/26/2017   Procedure: COLONOSCOPY;  Surgeon: Rogene Houston, MD;  [facility];  Service: Endoscopy;  Laterality: N/A;  10:30   Deviated septum repair     ESOPHAGEAL DILATION N/A 08/16/2019   Procedure: ESOPHAGEAL DILATION;  Surgeon: Rogene Houston, MD;  [facility];  Service: Endoscopy;  Laterality: N/A;   ESOPHAGOGASTRODUODENOSCOPY N/A 07/28/2016   Procedure: ESOPHAGOGASTRODUODENOSCOPY (EGD);  Surgeon: Rogene Houston, MD;  [facility];  Service: Endoscopy;  Laterality: N/A;  300 - moved to 3/7 @ 1:00   ESOPHAGOGASTRODUODENOSCOPY N/A 08/16/2019   Procedure: ESOPHAGOGASTRODUODENOSCOPY (EGD);  Surgeon: Rogene Houston, MD;  [facility];  Service: Endoscopy;  Laterality: N/A;  [place] N/A 10/11/2018   Procedure: FLEXIBLE SIGMOIDOSCOPY;  Surgeon: Rogene Houston, MD;  [facility];  Service: Endoscopy;  Laterality: N/A;  unable to complete colonoscopy    IR CT HEAD LTD  03/07/2021   IR PERCUTANEOUS ART THROMBECTOMY/INFUSION INTRACRANIAL INC DIAG ANGIO  03/07/2021   IR US GUIDE VASC ACCESS RIGHT  03/07/2021   KNEE ARTHROPLASTY Left 09/15/2015   Procedure: COMPUTER ASSISTED TOTAL KNEE ARTHROPLASTY;  Surgeon: Marybelle Killings, MD;  Location: [place];  Service: Orthopedics;  Laterality: Left;   KNEE ARTHROSCOPY     left knee 2003   RADIOLOGY WITH ANESTHESIA N/A 03/07/2021   Procedure: IR WITH ANESTHESIA;  Surgeon: Corrie Mckusick, DO;  Location: [place];  Service: Anesthesiology;  Laterality: N/A;    There were no vitals filed for this visit.   Subjective Assessment - 06/04/21 1716     Subjective "I like [employer]."    Currently in Pain? No/denies                   ADULT SLP TREATMENT - 06/04/21 1718       General Information   Behavior/Cognition Alert;Cooperative;Pleasant mood    Patient Positioning Upright in chair     Oral care provided N/A    HPI Lisa Sutton is a 88 y.o. female with PMH significant for HTN, GERD, Hep B, HLD, Obesity, osteoporosis who initially presented to [facility] ED with aphasia.  CT angio was done which showed left M2 occlusion, she was brought to [facility], underwent mechanical thrombectomy. MRI was remarkable for acute infarcts in the L MCA territory and right parietal cortex.  Pt was intubated less than 24 hrs on 10/15.  Pt has a hx of esophageal dysphagia. She received SLP therapy at [facility] during acute admission, [facility] for 2 weeks SNF, and was discharged from home health SLP services last week. She was referred for outpatient SLP therapy by Etter Sjogren, PA-C.      Treatment Provided   Treatment provided Cognitive-Linquistic      Pain Assessment   Pain Assessment No/denies pain      Cognitive-Linquistic Treatment   Treatment focused on Aphasia;Patient/family/caregiver education;Apraxia    Skilled Treatment SLP provided skilled intervention strategies to stimulate accurate word productions, creating of personalized communication template, and introduction of word finding strategies.      Assessment / Recommendations / Plan   Plan Continue with current plan of care      Progression Toward Goals   Progression toward goals Progressing toward goals                SLP Short Term Goals - 06/04/21 1720       SLP SHORT TERM GOAL #1   Title Pt will describe objects and pictures by providing at least three salient features as judged by clinician with 80% acc when provided mi/mod cues.    Baseline 70%    Time 4    Period Weeks    Status On-going    Target Date 06/29/21      SLP SHORT TERM GOAL #2   Title Pt will implement word-finding strategies with 80% accuracy when unable to verbalize desired word in conversation/functional tasks with mod assist.    Baseline 65%    Time 4    Period Weeks    Status On-going    Target Date 06/29/21       SLP SHORT TERM GOAL #3   Title Pt will complete reading comprehension tasks (short phrases) with 80% acc and min assist via multiple choice response.    Baseline max assist    Time 4    Period Weeks    Status On-going    Target Date 06/29/21      SLP SHORT TERM  GOAL #4   Title Pt will respond to open-ended questions with 80% acc and min/mod cues with use of personalized communication template (personally relevant vocabulary).    Baseline Will create personalized communication template over the first three sessions to use going forward    Time 4    Period Weeks    Status On-going    Target Date 06/29/21      SLP SHORT TERM GOAL #5   Title Pt will repeat short functional phrases/sentences with 80% acc when provided mi/mod cues for error awareness.    Baseline 40%    Time 4    Period Weeks    Status On-going    Target Date 06/29/21                Plan - 06/04/21 1720     Clinical Impression Statement Pt was motivated and attentive during treatment this date. SLP reviewed goals for therapy and plan for use of personalized communication template at home and in therapy. Pt generally aware of paraphasic errors and makes attempts to self correct. She needs reminders to slow down, look away and come back, and use gestures to assist with verbal production. Next session, plan to work on [employer] for Pt and ongoing education.    Speech Therapy Frequency 2x / week    Duration 8 weeks    Treatment/Interventions Compensatory strategies;Cueing hierarchy;Patient/family education;Functional tasks;Multimodal communcation approach;SLP instruction and feedback;Language facilitation    Potential to Achieve Goals Good    SLP Home Exercise Plan Pt will completed HEP as assigned to facilitate carryover of treatment strategies and techniques in home environment with use of written cues as needed.    Consulted and Agree with Plan of Care Patient;Family member/caregiver    Family Member [place] Son, [place]              Patient will benefit from skilled therapeutic intervention in order to improve the following deficits and impairments:   Aphasia  Cognitive communication deficit    Problem List Patient Active Problem List   Diagnosis Date Noted   Hypertensive emergency    Acute ischemic left MCA stroke ([place]) 03/07/2021   Cerebrovascular accident (CVA) ([place])    Encounter for intubation    Pruritus ani 08/05/2020   History of hepatitis B 02/05/2020   Chronic diarrhea 07/24/2019   Proteinuria 07/16/2019   GAD (generalized anxiety disorder) 03/02/2019   Controlled substance agreement signed 03/02/2019   Hyperlipidemia associated with type 2 diabetes mellitus ([place]) 02/19/2019   CKD stage 3 due to type 2 diabetes mellitus ([place]) 02/19/2019   Primary hypertension 02/19/2019   Elevated alkaline phosphatase level 01/15/2019   Stage 3 chronic kidney disease ([place]) 06/15/2017   History of colonic polyps 05/09/2017   Presbycusis of both ears 06/04/2016   Hypothyroidism due to acquired atrophy of thyroid 04/26/2016   Vitamin D deficiency 04/26/2016   Status post total left knee replacement 09/15/2015   Statin intolerance 09/11/2015   Diabetic neuropathy, type II diabetes mellitus ([place]) 07/30/2015   Abnormal chest x-ray 11/27/2014   Primary insomnia 04/12/2013   GERD (gastroesophageal reflux disease) 04/25/2012   Fatty liver 08/09/2011   Esophageal dysphagia 08/09/2011   Type 2 diabetes mellitus with diabetic nephropathy, without long-term current use of insulin ([place]) 08/09/2011   Thank you,  Genene Churn, [place]  Genene Churn, [place] 06/04/2021, 5:20 PM  [address] [place], [place], [address] Phone: [phone]   Fax:  [phone]  Name: Lisa Sutton MRN: [id number] Date of Birth: 1933/11/04

## 2021-06-10 ENCOUNTER — Encounter (HOSPITAL_COMMUNITY): Payer: Self-pay | Admitting: Speech Pathology

## 2021-06-10 ENCOUNTER — Other Ambulatory Visit: Payer: Self-pay

## 2021-06-10 ENCOUNTER — Ambulatory Visit (HOSPITAL_COMMUNITY): Payer: Medicare Other | Admitting: Speech Pathology

## 2021-06-10 ENCOUNTER — Ambulatory Visit (HOSPITAL_COMMUNITY): Payer: Medicare Other

## 2021-06-10 ENCOUNTER — Encounter (HOSPITAL_COMMUNITY): Payer: Self-pay

## 2021-06-10 DIAGNOSIS — R4701 Aphasia: Secondary | ICD-10-CM

## 2021-06-10 DIAGNOSIS — R29818 Other symptoms and signs involving the nervous system: Secondary | ICD-10-CM

## 2021-06-10 DIAGNOSIS — R41841 Cognitive communication deficit: Secondary | ICD-10-CM

## 2021-06-10 DIAGNOSIS — R278 Other lack of coordination: Secondary | ICD-10-CM

## 2021-06-10 NOTE — Therapy (Signed)
[place], [place], [address] Phone: [phone]   Fax:  [phone]  Speech Language Pathology Treatment  Patient Details  Name: Lisa Sutton MRN: [id number] Date of Birth: 11-02-1933 Referring Provider (SLP): Etter Sjogren, [facility]   Encounter Date: 06/10/2021   End of Session - 06/10/21 1019     Visit Number 3    Number of Visits 10    Date for SLP Re-Evaluation 07/09/21    Authorization Type UHC Medicare   UHC Med 05/24/2021-05/23/2022  OOP 3,600  co-pay 20  no DED, no visit limit, no auth   SLP Start Time [phone]    SLP Stop Time  1036    SLP Time Calculation (min) 45 min    Activity Tolerance Patient tolerated treatment well             Past Medical History:  Diagnosis Date   Anxiety    Arthritis    Cataract    Dysphagia    'sometimes but not a major issue' been checked out by GI (per pt)   Dysrhythmia    'heart used to skip but doesn't anymore' was checked out by Dr. Ron Parker late '90s, everything checked out ok and not had any skipping since (all per pt)   Essential hypertension, benign    Fatty liver    GERD (gastroesophageal reflux disease)    H/O hypercalcemia 04/12/2017   Hepatitis B    had at age 10, 'GI doc said it's gone away'   Hepatitis B surface antigen positive    High cholesterol    History of hiatal hernia    Hypercalcemia    Hypothyroidism    Kidney stone August 2014   Patient was seen at [facility]   Obesity    Osteoporosis    PONV (postoperative nausea and vomiting)    Type 2 diabetes mellitus ([place])     Past Surgical History:  Procedure Laterality Date   ABDOMINAL HYSTERECTOMY     BILATERAL CARPAL TUNNEL RELEASE     Dr. Marlou Sa at surgical center   BIOPSY  05/26/2017   Procedure: BIOPSY;  Surgeon: Rogene Houston, MD;  [facility];  Service: Endoscopy;;  colon   BIOPSY  10/11/2018   Procedure: BIOPSY;  Surgeon: Rogene Houston, MD;  [facility];   Service: Endoscopy;;   CATARACT EXTRACTION, BILATERAL     COLONOSCOPY N/A 05/26/2017   Procedure: COLONOSCOPY;  Surgeon: Rogene Houston, MD;  [facility];  Service: Endoscopy;  Laterality: N/A;  10:30   Deviated septum repair     ESOPHAGEAL DILATION N/A 08/16/2019   Procedure: ESOPHAGEAL DILATION;  Surgeon: Rogene Houston, MD;  [facility];  Service: Endoscopy;  Laterality: N/A;   ESOPHAGOGASTRODUODENOSCOPY N/A 07/28/2016   Procedure: ESOPHAGOGASTRODUODENOSCOPY (EGD);  Surgeon: Rogene Houston, MD;  [facility];  Service: Endoscopy;  Laterality: N/A;  300 - moved to 3/7 @ 1:00   ESOPHAGOGASTRODUODENOSCOPY N/A 08/16/2019   Procedure: ESOPHAGOGASTRODUODENOSCOPY (EGD);  Surgeon: Rogene Houston, MD;  [facility];  Service: Endoscopy;  Laterality: N/A;  [place] N/A 10/11/2018   Procedure: FLEXIBLE SIGMOIDOSCOPY;  Surgeon: Rogene Houston, MD;  [facility];  Service: Endoscopy;  Laterality: N/A;  unable to complete colonoscopy    IR CT HEAD LTD  03/07/2021   IR PERCUTANEOUS ART THROMBECTOMY/INFUSION INTRACRANIAL INC DIAG ANGIO  03/07/2021   IR US GUIDE VASC ACCESS RIGHT  03/07/2021   KNEE ARTHROPLASTY Left 09/15/2015   Procedure: COMPUTER ASSISTED TOTAL KNEE ARTHROPLASTY;  Surgeon: Marybelle Killings, MD;  Location: [place];  Service: Orthopedics;  Laterality: Left;   KNEE ARTHROSCOPY     left knee 2003   RADIOLOGY WITH ANESTHESIA N/A 03/07/2021   Procedure: IR WITH ANESTHESIA;  Surgeon: Corrie Mckusick, DO;  Location: [place];  Service: Anesthesiology;  Laterality: N/A;    There were no vitals filed for this visit.   Subjective Assessment - 06/10/21 1014     Subjective "I went to a ball game."    Currently in Pain? No/denies              ADULT SLP TREATMENT - 06/10/21 1015       General Information   Behavior/Cognition Alert;Cooperative;Pleasant mood    Patient Positioning Upright in chair     Oral care provided N/A    HPI [name] is a 88 y.o. female with PMH significant for HTN, GERD, Hep B, HLD, Obesity, osteoporosis who initially presented to [facility] ED with aphasia.  CT angio was done which showed left M2 occlusion, she was brought to [facility], underwent mechanical thrombectomy. MRI was remarkable for acute infarcts in the L MCA territory and right parietal cortex.  Pt was intubated less than 24 hrs on 10/15.  Pt has a hx of esophageal dysphagia. She received SLP therapy at [facility] during acute admission, [facility] for 2 weeks SNF, and was discharged from home health SLP services last week. She was referred for outpatient SLP therapy by Etter Sjogren, PA-C.      Treatment Provided   Treatment provided Cognitive-Linquistic      Pain Assessment   Pain Assessment No/denies pain      Cognitive-Linquistic Treatment   Treatment focused on Aphasia;Patient/family/caregiver education;Apraxia    Skilled Treatment SLP provided skilled intervention strategies to stimulate accurate word productions, cues to pair a gesture with verbal attempts, guided conversation with written cues, and education to Pt and son.     Assessment / Recommendations / Plan   Plan Continue with current plan of care      Progression Toward Goals   Progression toward goals Progressing toward goals              SLP Education - 06/10/21 1019     Education Details HEP provided with naming to description and sentence completion    Person(s) Educated Patient    Methods Explanation;Handout    Comprehension Verbalized understanding              SLP Short Term Goals - 06/10/21 1021       SLP SHORT TERM GOAL #1   Title Pt will describe objects and pictures by providing at least three salient features as judged by clinician with 80% acc when provided mi/mod cues.    Baseline 70%    Time 4    Period Weeks    Status On-going    Target Date 06/29/21      SLP SHORT TERM GOAL  #2   Title Pt will implement word-finding strategies with 80% accuracy when unable to verbalize desired word in conversation/functional tasks with mod assist.    Baseline 65%    Time 4    Period Weeks    Status On-going    Target Date 06/29/21      SLP SHORT TERM GOAL #3   Title  Pt will complete reading comprehension tasks (short phrases) with 80% acc and min assist via multiple choice response.    Baseline max assist    Time 4    Period Weeks    Status On-going    Target Date 06/29/21      SLP SHORT TERM GOAL #4   Title Pt will respond to open-ended questions with 80% acc and min/mod cues with use of personalized communication template (personally relevant vocabulary).    Baseline Will create personalized communication template over the first three sessions to use going forward    Time 4    Period Weeks    Status On-going    Target Date 06/29/21      SLP SHORT TERM GOAL #5   Title Pt will repeat short functional phrases/sentences with 80% acc when provided mi/mod cues for error awareness.    Baseline 40%    Time 4    Period Weeks    Status On-going    Target Date 06/29/21                Plan - 06/10/21 1020     Clinical Impression Statement Pt continues to be alert and motivated for therapy.She purchased a notebook and SLP added HEP in addition to communication template. She participated in conversation regarding a basketball game she attended with a neighbor. SLP wrote down key words from the conversation to allow Pt to refer back to as needed. SLP also sampled each of the HEP activities with Pt so that she can continue to complete at home. She completed single word sentence completion, phrase repetition, naming to description, and basic "what do" questions with overall 95% acc with min cues. Continue to target word finding strategies in conversation next session.    Speech Therapy Frequency 2x / week    Duration 8 weeks    Treatment/Interventions Compensatory  strategies;Cueing hierarchy;Patient/family education;Functional tasks;Multimodal communcation approach;SLP instruction and feedback;Language facilitation    Potential to Achieve Goals Good    SLP Home Exercise Plan Pt will completed HEP as assigned to facilitate carryover of treatment strategies and techniques in home environment with use of written cues as needed.    Consulted and Agree with Plan of Care Patient;Family member/caregiver    Family Member [place] Son, [place]             Patient will benefit from skilled therapeutic intervention in order to improve the following deficits and impairments:   Aphasia  Cognitive communication deficit    Problem List Patient Active Problem List   Diagnosis Date Noted   Hypertensive emergency    Acute ischemic left MCA stroke ([place]) 03/07/2021   Cerebrovascular accident (CVA) ([place])    Encounter for intubation    Pruritus ani 08/05/2020   History of hepatitis B 02/05/2020   Chronic diarrhea 07/24/2019   Proteinuria 07/16/2019   GAD (generalized anxiety disorder) 03/02/2019   Controlled substance agreement signed 03/02/2019   Hyperlipidemia associated with type 2 diabetes mellitus ([place]) 02/19/2019   CKD stage 3 due to type 2 diabetes mellitus ([place]) 02/19/2019   Primary hypertension 02/19/2019   Elevated alkaline phosphatase level 01/15/2019   Stage 3 chronic kidney disease ([place]) 06/15/2017   History of colonic polyps 05/09/2017   Presbycusis of both ears 06/04/2016   Hypothyroidism due to acquired atrophy of thyroid 04/26/2016   Vitamin D deficiency 04/26/2016   Status post total left knee replacement 09/15/2015   Statin intolerance 09/11/2015   Diabetic neuropathy, type II diabetes mellitus (  [place]) 07/30/2015   Abnormal chest x-ray 11/27/2014   Primary insomnia 04/12/2013   GERD (gastroesophageal reflux disease) 04/25/2012   Fatty liver 08/09/2011   Esophageal dysphagia 08/09/2011   Type 2 diabetes mellitus with diabetic  nephropathy, without long-term current use of insulin ([place]) 08/09/2011   Thank you,  Genene Churn, [place]  [place], [place] 06/10/2021, 10:23 AM  [place], [place], [address] Phone: [phone]   Fax:  [phone]   Name: LAURETTE VILLESCAS MRN: [id number] Date of Birth: [birth year]

## 2021-06-10 NOTE — Therapy (Signed)
[place], [place], [address] Phone: [phone]   Fax:  [phone]  Occupational Therapy Treatment  Patient Details  Name: Lisa Sutton MRN: [id number] Date of Birth: 1933/06/15 Referring Provider (OT): Etter Sjogren, NP   Encounter Date: 06/10/2021   OT End of Session - 06/10/21 0914     Visit Number 3    Number of Visits 6    Date for OT Re-Evaluation 06/19/21    Authorization Type UHC Medicare, $20 copay    Progress Note Due on Visit 10    OT Start Time 0900    OT Stop Time 0938    OT Time Calculation (min) 38 min    Activity Tolerance Patient tolerated treatment well    Behavior During Therapy [facility] for tasks assessed/performed             Past Medical History:  Diagnosis Date   Anxiety    Arthritis    Cataract    Dysphagia    'sometimes but not a major issue' been checked out by GI (per pt)   Dysrhythmia    'heart used to skip but doesn't anymore' was checked out by Dr. Ron Parker late '90s, everything checked out ok and not had any skipping since (all per pt)   Essential hypertension, benign    Fatty liver    GERD (gastroesophageal reflux disease)    H/O hypercalcemia 04/12/2017   Hepatitis B    had at age 87, 'GI doc said it's gone away'   Hepatitis B surface antigen positive    High cholesterol    History of hiatal hernia    Hypercalcemia    Hypothyroidism    Kidney stone August 2014   Patient was seen at [facility]   Obesity    Osteoporosis    PONV (postoperative nausea and vomiting)    Type 2 diabetes mellitus ([place])     Past Surgical History:  Procedure Laterality Date   ABDOMINAL HYSTERECTOMY     BILATERAL CARPAL TUNNEL RELEASE     Dr. Marlou Sa at surgical center   BIOPSY  05/26/2017   Procedure: BIOPSY;  Surgeon: Rogene Houston, MD;  [facility];  Service: Endoscopy;;  colon   BIOPSY  10/11/2018   Procedure: BIOPSY;  Surgeon: Rogene Houston, MD;  [facility];  Service: Endoscopy;;   CATARACT EXTRACTION, BILATERAL     COLONOSCOPY N/A 05/26/2017   Procedure: COLONOSCOPY;  Surgeon: Rogene Houston, MD;  [facility];  Service: Endoscopy;  Laterality: N/A;  10:30   Deviated septum repair     ESOPHAGEAL DILATION N/A 08/16/2019   Procedure: ESOPHAGEAL DILATION;  Surgeon: Rogene Houston, MD;  [facility];  Service: Endoscopy;  Laterality: N/A;   ESOPHAGOGASTRODUODENOSCOPY N/A 07/28/2016   Procedure: ESOPHAGOGASTRODUODENOSCOPY (EGD);  Surgeon: Rogene Houston, MD;  [facility];  Service: Endoscopy;  Laterality: N/A;  300 - moved to 3/7 @ 1:00   ESOPHAGOGASTRODUODENOSCOPY N/A 08/16/2019   Procedure: ESOPHAGOGASTRODUODENOSCOPY (EGD);  Surgeon: Rogene Houston, MD;  [facility];  Service: Endoscopy;  Laterality: N/A;  [place] N/A 10/11/2018   Procedure: FLEXIBLE SIGMOIDOSCOPY;  Surgeon: Rogene Houston, MD;  [facility];  Service: Endoscopy;  Laterality: N/A;  unable to complete colonoscopy    IR CT HEAD LTD  03/07/2021   IR PERCUTANEOUS ART THROMBECTOMY/INFUSION INTRACRANIAL INC DIAG  ANGIO  03/07/2021   IR US GUIDE VASC ACCESS RIGHT  03/07/2021   KNEE ARTHROPLASTY Left 09/15/2015   Procedure: COMPUTER ASSISTED TOTAL KNEE ARTHROPLASTY;  Surgeon: Marybelle Killings, MD;  Location: [place];  Service: Orthopedics;  Laterality: Left;   KNEE ARTHROSCOPY     left knee 2003   RADIOLOGY WITH ANESTHESIA N/A 03/07/2021   Procedure: IR WITH ANESTHESIA;  Surgeon: Corrie Mckusick, DO;  Location: [place];  Service: Anesthesiology;  Laterality: N/A;    There were no vitals filed for this visit.   Subjective Assessment - 06/10/21 0909     Subjective  S: I feel dizzy today.    Currently in Pain? No/denies                [facility] OT Assessment - 06/10/21 0909       Assessment   Medical Diagnosis s/p left MCA CVA & thrombectomy      Precautions   Precautions None                       OT Treatments/Exercises (OP) - 06/10/21 0909       Exercises   Exercises Hand      Fine Motor Coordination (Hand/Wrist)   Fine Motor Coordination Grooved pegs    Handwriting Provided visual demonstration on lined paper. Wrote first and last name 4 times. Attempted to write house number and road name also 4 times.    Grooved pegs Grooved pegboard completed using right hand. All pegs placed then removed while attempting to hold all pegs in her hand without dropping.                      OT Short Term Goals - 06/04/21 1129       OT SHORT TERM GOAL #1   Title Pt will be provided with and educated on HEP to increase use of dominant RUE during ADL completion.    Time 3    Period Weeks    Status On-going    Target Date 06/19/21      OT SHORT TERM GOAL #2   Title Pt will increase right fine motor coordination required for self-feeding by completing 9 hole peg test in under 50".    Time 3    Period Weeks    Status On-going      OT SHORT TERM GOAL #3   Title Pt will be educated on use of AE available to compensate for sensation deficits when attempting to use RUE during ADLs.    Time 3    Period Weeks    Status On-going                      Plan - 06/10/21 0915     Clinical Impression Statement A: Pt reports that she tried to work on the coordination HEP. She reports that she had increased difficulty with writing her name and address. Min-mod difficulty with grooved pegboard activity while focusing on coordination. Required increased time to complete. Provided a visual demonstration of first and last name then visual demonstration of house number and road name. Pt was able to write name 4 times. First two times, she was able to maintain name on the line. Last two attempts she wrote her name under the line and it continued to cascade downward. Max difficulty with address. With increased time and several practice trials, she was able to  write "1  0  2." Combined her 2 and 5 every time. Perseverated on her name and the spelling while attempting to write her house number. She then perseverated on spelling the number 5 versus drawing the number 5. Unable to tracing numbers when provided with a highlight example. Did not follow through with directions of following along the green high light marker on paper.    Body Structure / Function / Physical Skills ADL;Endurance;UE functional use;Proprioception;Coordination;IADL    Plan P: Pt was requested to bring her glasses next session. Complete colored pegboard pattern (easy) and complete with the least amount of assistance.    OT Home Exercise Plan 1/12: coordination HEP    Consulted and Agree with Plan of Care Patient             Patient will benefit from skilled therapeutic intervention in order to improve the following deficits and impairments:   Body Structure / Function / Physical Skills: ADL, Endurance, UE functional use, Proprioception, Coordination, IADL       Visit Diagnosis: Other lack of coordination  Other symptoms and signs involving the nervous system    Problem List Patient Active Problem List   Diagnosis Date Noted   Hypertensive emergency    Acute ischemic left MCA stroke ([place]) 03/07/2021   Cerebrovascular accident (CVA) ([place])    Encounter for intubation    Pruritus ani 08/05/2020   History of hepatitis B 02/05/2020   Chronic diarrhea 07/24/2019   Proteinuria 07/16/2019   GAD (generalized anxiety disorder) 03/02/2019   Controlled substance agreement signed 03/02/2019   Hyperlipidemia associated with type 2 diabetes mellitus ([place]) 02/19/2019   CKD stage 3 due to type 2 diabetes mellitus ([place]) 02/19/2019   Primary hypertension 02/19/2019   Elevated alkaline phosphatase level 01/15/2019   Stage 3 chronic kidney disease ([place]) 06/15/2017   History of colonic polyps 05/09/2017   Presbycusis of both ears 06/04/2016   Hypothyroidism due to acquired  atrophy of thyroid 04/26/2016   Vitamin D deficiency 04/26/2016   Status post total left knee replacement 09/15/2015   Statin intolerance 09/11/2015   Diabetic neuropathy, type II diabetes mellitus ([place]) 07/30/2015   Abnormal chest x-ray 11/27/2014   Primary insomnia 04/12/2013   GERD (gastroesophageal reflux disease) 04/25/2012   Fatty liver 08/09/2011   Esophageal dysphagia 08/09/2011   Type 2 diabetes mellitus with diabetic nephropathy, without long-term current use of insulin [facility]) 08/09/2011    Ailene Ravel, OTR/L,CBIS  [phone]  06/10/2021, 10:19 AM  [place], [place], [address] Phone: [phone]   Fax:  [phone]  Name: Lisa Sutton MRN: [id number] Date of Birth: Apr 02, 1934

## 2021-06-11 ENCOUNTER — Ambulatory Visit: Payer: Medicare Other | Admitting: Internal Medicine

## 2021-06-11 ENCOUNTER — Ambulatory Visit (HOSPITAL_COMMUNITY): Payer: Medicare Other

## 2021-06-11 ENCOUNTER — Ambulatory Visit (HOSPITAL_COMMUNITY): Payer: Medicare Other | Admitting: Speech Pathology

## 2021-06-11 ENCOUNTER — Encounter (HOSPITAL_COMMUNITY): Payer: Self-pay | Admitting: Speech Pathology

## 2021-06-11 ENCOUNTER — Encounter (HOSPITAL_COMMUNITY): Payer: Self-pay

## 2021-06-11 DIAGNOSIS — R29818 Other symptoms and signs involving the nervous system: Secondary | ICD-10-CM

## 2021-06-11 DIAGNOSIS — R4701 Aphasia: Secondary | ICD-10-CM

## 2021-06-11 DIAGNOSIS — R41841 Cognitive communication deficit: Secondary | ICD-10-CM

## 2021-06-11 DIAGNOSIS — R278 Other lack of coordination: Secondary | ICD-10-CM

## 2021-06-11 NOTE — Therapy (Signed)
[place], [place], [address] Phone: [phone]   Fax:  [phone]  Occupational Therapy Treatment  Patient Details  Name: Lisa Sutton MRN: [id number] Date of Birth: [birth year] Referring Provider (OT): Etter Sjogren, NP   Encounter Date: 06/11/2021   OT End of Session - 06/11/21 1019     Visit Number 4    Number of Visits 6    Date for OT Re-Evaluation 06/19/21    Authorization Type UHC Medicare, $20 copay    Progress Note Due on Visit 10    OT Start Time 0900    OT Stop Time 0938    OT Time Calculation (min) 38 min    Activity Tolerance Patient tolerated treatment well    Behavior During Therapy [facility] for tasks assessed/performed             Past Medical History:  Diagnosis Date   Anxiety    Arthritis    Cataract    Dysphagia    'sometimes but not a major issue' been checked out by GI (per pt)   Dysrhythmia    'heart used to skip but doesn't anymore' was checked out by Dr. Ron Parker late '90s, everything checked out ok and not had any skipping since (all per pt)   Essential hypertension, benign    Fatty liver    GERD (gastroesophageal reflux disease)    H/O hypercalcemia 04/12/2017   Hepatitis B    had at age 60, 'GI doc said it's gone away'   Hepatitis B surface antigen positive    High cholesterol    History of hiatal hernia    Hypercalcemia    Hypothyroidism    Kidney stone August 2014   Patient was seen at [facility]   Obesity    Osteoporosis    PONV (postoperative nausea and vomiting)    Type 2 diabetes mellitus ([place])     Past Surgical History:  Procedure Laterality Date   ABDOMINAL HYSTERECTOMY     BILATERAL CARPAL TUNNEL RELEASE     Dr. Marlou Sa at surgical center   BIOPSY  05/26/2017   Procedure: BIOPSY;  Surgeon: Rogene Houston, MD;  [facility];  Service: Endoscopy;;  colon   BIOPSY  10/11/2018   Procedure: BIOPSY;  Surgeon: Rogene Houston, MD;  [facility];  Service: Endoscopy;;   CATARACT EXTRACTION, BILATERAL     COLONOSCOPY N/A 05/26/2017   Procedure: COLONOSCOPY;  Surgeon: Rogene Houston, MD;  [facility];  Service: Endoscopy;  Laterality: N/A;  10:30   Deviated septum repair     ESOPHAGEAL DILATION N/A 08/16/2019   Procedure: ESOPHAGEAL DILATION;  Surgeon: Rogene Houston, MD;  [facility];  Service: Endoscopy;  Laterality: N/A;   ESOPHAGOGASTRODUODENOSCOPY N/A 07/28/2016   Procedure: ESOPHAGOGASTRODUODENOSCOPY (EGD);  Surgeon: Rogene Houston, MD;  [facility];  Service: Endoscopy;  Laterality: N/A;  300 - moved to 3/7 @ 1:00   ESOPHAGOGASTRODUODENOSCOPY N/A 08/16/2019   Procedure: ESOPHAGOGASTRODUODENOSCOPY (EGD);  Surgeon: Rogene Houston, MD;  [facility];  Service: Endoscopy;  Laterality: N/A;  [place] N/A 10/11/2018   Procedure: FLEXIBLE SIGMOIDOSCOPY;  Surgeon: Rogene Houston, MD;  [facility];  Service: Endoscopy;  Laterality: N/A;  unable to complete colonoscopy    IR CT HEAD LTD  03/07/2021   IR PERCUTANEOUS ART THROMBECTOMY/INFUSION INTRACRANIAL INC DIAG  ANGIO  03/07/2021   IR US GUIDE VASC ACCESS RIGHT  03/07/2021   KNEE ARTHROPLASTY Left 09/15/2015   Procedure: COMPUTER ASSISTED TOTAL KNEE ARTHROPLASTY;  Surgeon: Marybelle Killings, MD;  Location: [place];  Service: Orthopedics;  Laterality: Left;   KNEE ARTHROSCOPY     left knee 2003   RADIOLOGY WITH ANESTHESIA N/A 03/07/2021   Procedure: IR WITH ANESTHESIA;  Surgeon: Corrie Mckusick, DO;  Location: [place];  Service: Anesthesiology;  Laterality: N/A;    There were no vitals filed for this visit.   Subjective Assessment - 06/11/21 0911     Subjective  S: I forgot my glasses.    Currently in Pain? No/denies                [facility] OT Assessment - 06/11/21 0911       Assessment   Medical Diagnosis s/p left MCA CVA & thrombectomy      Precautions   Precautions None                       OT Treatments/Exercises (OP) - 06/11/21 0911       Exercises   Exercises Hand      Fine Motor Coordination (Hand/Wrist)   Fine Motor Coordination Small Pegboard;Manipulating coins;Stacking coins    Small Pegboard pt completed one pattern (easy difficulty level) with colored pegs. To remove pegs, pt utilized tweezers to place pegs from board to container. Used tweezers to pick up each additional peg from the table to place in container.    Manipulating coins Picked up 5 coins one at a time transferring them from fingertip to palm.    Stacking coins Transferring 5 coins one at a time from palm to fingertip to create a stack.                      OT Short Term Goals - 06/04/21 1129       OT SHORT TERM GOAL #1   Title Pt will be provided with and educated on HEP to increase use of dominant RUE during ADL completion.    Time 3    Period Weeks    Status On-going    Target Date 06/19/21      OT SHORT TERM GOAL #2   Title Pt will increase right fine motor coordination required for self-feeding by completing 9 hole peg test in under 50".    Time 3    Period Weeks    Status On-going      OT SHORT TERM GOAL #3   Title Pt will be educated on use of AE available to compensate for sensation deficits when attempting to use RUE during ADLs.    Time 3    Period Weeks    Status On-going                      Plan - 06/11/21 1020     Clinical Impression Statement A: Patient forgot to bring glasses this session. Continued with coordination tasks. Required increased time to complete pegboard task. Continues to demonstrate difficult initiating a task or activity when provided verbal cues and also visual cues. Requires several attempts before completing correctly. Several times attempted to place peg in paper pattern versus pegboard.    Body Structure / Function / Physical Skills ADL;Endurance;UE functional use;Proprioception;Coordination;IADL     Plan P: Continue with coordination tasks.    Consulted and Agree with Plan of Care  Patient             Patient will benefit from skilled therapeutic intervention in order to improve the following deficits and impairments:   Body Structure / Function / Physical Skills: ADL, Endurance, UE functional use, Proprioception, Coordination, IADL       Visit Diagnosis: Other lack of coordination  Other symptoms and signs involving the nervous system    Problem List Patient Active Problem List   Diagnosis Date Noted   Hypertensive emergency    Acute ischemic left MCA stroke ([place]) 03/07/2021   Cerebrovascular accident (CVA) ([place])    Encounter for intubation    Pruritus ani 08/05/2020   History of hepatitis B 02/05/2020   Chronic diarrhea 07/24/2019   Proteinuria 07/16/2019   GAD (generalized anxiety disorder) 03/02/2019   Controlled substance agreement signed 03/02/2019   Hyperlipidemia associated with type 2 diabetes mellitus ([place]) 02/19/2019   CKD stage 3 due to type 2 diabetes mellitus ([place]) 02/19/2019   Primary hypertension 02/19/2019   Elevated alkaline phosphatase level 01/15/2019   Stage 3 chronic kidney disease ([place]) 06/15/2017   History of colonic polyps 05/09/2017   Presbycusis of both ears 06/04/2016   Hypothyroidism due to acquired atrophy of thyroid 04/26/2016   Vitamin D deficiency 04/26/2016   Status post total left knee replacement 09/15/2015   Statin intolerance 09/11/2015   Diabetic neuropathy, type II diabetes mellitus ([place]) 07/30/2015   Abnormal chest x-ray 11/27/2014   Primary insomnia 04/12/2013   GERD (gastroesophageal reflux disease) 04/25/2012   Fatty liver 08/09/2011   Esophageal dysphagia 08/09/2011   Type 2 diabetes mellitus with diabetic nephropathy, without long-term current use of insulin [facility]) 08/09/2011   Ailene Ravel, OTR/L,CBIS  [phone]  06/11/2021, 10:26 AM  [place], [place], [address] Phone: [phone]   Fax:  [phone]  Name: Lisa Sutton MRN: [id number] Date of Birth: [birth year]

## 2021-06-11 NOTE — Therapy (Signed)
[place], [place], [address] Phone: [phone]   Fax:  [phone]  Speech Language Pathology Treatment  Patient Details  Name: Lisa Sutton MRN: [id number] Date of Birth: 06/02/1933 Referring Provider (SLP): Etter Sjogren, [facility]   Encounter Date: 06/11/2021   End of Session - 06/11/21 1048     Visit Number 4    Number of Visits 10    Date for SLP Re-Evaluation 07/09/21    Authorization Type UHC Medicare   UHC Med 05/24/2021-05/23/2022  OOP 3,600  co-pay 20  no DED, no visit limit, no auth   SLP Start Time [phone]    SLP Stop Time  1040    SLP Time Calculation (min) 48 min    Activity Tolerance Patient tolerated treatment well             Past Medical History:  Diagnosis Date   Anxiety    Arthritis    Cataract    Dysphagia    'sometimes but not a major issue' been checked out by GI (per pt)   Dysrhythmia    'heart used to skip but doesn't anymore' was checked out by Dr. Ron Parker late '90s, everything checked out ok and not had any skipping since (all per pt)   Essential hypertension, benign    Fatty liver    GERD (gastroesophageal reflux disease)    H/O hypercalcemia 04/12/2017   Hepatitis B    had at age 58, 'GI doc said it's gone away'   Hepatitis B surface antigen positive    High cholesterol    History of hiatal hernia    Hypercalcemia    Hypothyroidism    Kidney stone August 2014   Patient was seen at [facility]   Obesity    Osteoporosis    PONV (postoperative nausea and vomiting)    Type 2 diabetes mellitus ([place])     Past Surgical History:  Procedure Laterality Date   ABDOMINAL HYSTERECTOMY     BILATERAL CARPAL TUNNEL RELEASE     Dr. Marlou Sa at surgical center   BIOPSY  05/26/2017   Procedure: BIOPSY;  Surgeon: Rogene Houston, MD;  [facility];  Service: Endoscopy;;  colon   BIOPSY  10/11/2018   Procedure: BIOPSY;  Surgeon: Rogene Houston, MD;  [facility];   Service: Endoscopy;;   CATARACT EXTRACTION, BILATERAL     COLONOSCOPY N/A 05/26/2017   Procedure: COLONOSCOPY;  Surgeon: Rogene Houston, MD;  [facility];  Service: Endoscopy;  Laterality: N/A;  10:30   Deviated septum repair     ESOPHAGEAL DILATION N/A 08/16/2019   Procedure: ESOPHAGEAL DILATION;  Surgeon: Rogene Houston, MD;  [facility];  Service: Endoscopy;  Laterality: N/A;   ESOPHAGOGASTRODUODENOSCOPY N/A 07/28/2016   Procedure: ESOPHAGOGASTRODUODENOSCOPY (EGD);  Surgeon: Rogene Houston, MD;  [facility];  Service: Endoscopy;  Laterality: N/A;  300 - moved to 3/7 @ 1:00   ESOPHAGOGASTRODUODENOSCOPY N/A 08/16/2019   Procedure: ESOPHAGOGASTRODUODENOSCOPY (EGD);  Surgeon: Rogene Houston, MD;  [facility];  Service: Endoscopy;  Laterality: N/A;  [place] N/A 10/11/2018   Procedure: FLEXIBLE SIGMOIDOSCOPY;  Surgeon: Rogene Houston, MD;  [facility];  Service: Endoscopy;  Laterality: N/A;  unable to complete colonoscopy    IR CT HEAD LTD  03/07/2021   IR PERCUTANEOUS ART THROMBECTOMY/INFUSION INTRACRANIAL INC DIAG ANGIO  03/07/2021   IR US GUIDE VASC ACCESS RIGHT  03/07/2021   KNEE ARTHROPLASTY Left 09/15/2015   Procedure: COMPUTER ASSISTED TOTAL KNEE ARTHROPLASTY;  Surgeon: Marybelle Killings, MD;  Location: [place];  Service: Orthopedics;  Laterality: Left;   KNEE ARTHROSCOPY     left knee 2003   RADIOLOGY WITH ANESTHESIA N/A 03/07/2021   Procedure: IR WITH ANESTHESIA;  Surgeon: Corrie Mckusick, DO;  Location: [place];  Service: Anesthesiology;  Laterality: N/A;    There were no vitals filed for this visit.   Subjective Assessment - 06/11/21 1046     Subjective "I tried to do it."    Currently in Pain? No/denies                   ADULT SLP TREATMENT - 06/11/21 1047       General Information   Behavior/Cognition Alert;Cooperative;Pleasant mood    Patient Positioning Upright in chair     Oral care provided N/A    HPI Lisa Sutton is a 88 y.o. female with PMH significant for HTN, GERD, Hep B, HLD, Obesity, osteoporosis who initially presented to [facility] ED with aphasia.  CT angio was done which showed left M2 occlusion, she was brought to [facility], underwent mechanical thrombectomy. MRI was remarkable for acute infarcts in the L MCA territory and right parietal cortex.  Pt was intubated less than 24 hrs on 10/15.  Pt has a hx of esophageal dysphagia. She received SLP therapy at [facility] during acute admission, [facility] for 2 weeks SNF, and was discharged from home health SLP services last week. She was referred for outpatient SLP therapy by Etter Sjogren, PA-C.      Treatment Provided   Treatment provided Cognitive-Linquistic      Pain Assessment   Pain Assessment No/denies pain      Cognitive-Linquistic Treatment   Treatment focused on Aphasia;Patient/family/caregiver education;Apraxia    Skilled Treatment SLP provided skilled intervention strategies to stimulate accurate word productions, cues to pair a gesture with verbal attempts, guided conversation with written cues, visual tracking during oral reading and copying letters, and cues for word finding strategies (provide function of object).      Assessment / Recommendations / Plan   Plan Continue with current plan of care      Progression Toward Goals   Progression toward goals Progressing toward goals                SLP Short Term Goals - 06/11/21 1049       SLP SHORT TERM GOAL #1   Title Pt will describe objects and pictures by providing at least three salient features as judged by clinician with 80% acc when provided mi/mod cues.    Baseline 70%    Time 4    Period Weeks    Status On-going    Target Date 06/29/21      SLP SHORT TERM GOAL #2   Title Pt will implement word-finding strategies with 80% accuracy when unable to verbalize desired word in conversation/functional tasks  with mod assist.    Baseline 65%    Time 4    Period Weeks    Status On-going    Target Date 06/29/21      SLP SHORT TERM GOAL #3   Title Pt will complete reading comprehension tasks (short phrases) with 80% acc and min assist via multiple choice response.    Baseline max assist    Time 4  Period Weeks    Status On-going    Target Date 06/29/21      SLP SHORT TERM GOAL #4   Title Pt will respond to open-ended questions with 80% acc and min/mod cues with use of personalized communication template (personally relevant vocabulary).    Baseline Will create personalized communication template over the first three sessions to use going forward    Time 4    Period Weeks    Status On-going    Target Date 06/29/21      SLP SHORT TERM GOAL #5   Title Pt will repeat short functional phrases/sentences with 80% acc when provided mi/mod cues for error awareness.    Baseline 40%    Time 4    Period Weeks    Status On-going    Target Date 06/29/21                Plan - 06/11/21 1049     Clinical Impression Statement Pt continues to be alert and motivated for therapy. She identified appropriate items in concrete categories (colors and furniture) with 100% acc, but needed guidance for visual scanning and circling the words. She named low frequency pictured objects with 90% acc with min cues and stated function of object with 95% acc with min cues. Continue to target word finding strategies in conversation next session.    Speech Therapy Frequency 2x / week    Duration 8 weeks    Treatment/Interventions Compensatory strategies;Cueing hierarchy;Patient/family education;Functional tasks;Multimodal communcation approach;SLP instruction and feedback;Language facilitation    Potential to Achieve Goals Good    SLP Home Exercise Plan Pt will completed HEP as assigned to facilitate carryover of treatment strategies and techniques in home environment with use of written cues as needed.     Consulted and Agree with Plan of Care Patient;Family member/caregiver    Family Member Campbell Son, [place]             Patient will benefit from skilled therapeutic intervention in order to improve the following deficits and impairments:   Aphasia  Cognitive communication deficit    Problem List Patient Active Problem List   Diagnosis Date Noted   Hypertensive emergency    Acute ischemic left MCA stroke ([place]) 03/07/2021   Cerebrovascular accident (CVA) ([place])    Encounter for intubation    Pruritus ani 08/05/2020   History of hepatitis B 02/05/2020   Chronic diarrhea 07/24/2019   Proteinuria 07/16/2019   GAD (generalized anxiety disorder) 03/02/2019   Controlled substance agreement signed 03/02/2019   Hyperlipidemia associated with type 2 diabetes mellitus ([place]) 02/19/2019   CKD stage 3 due to type 2 diabetes mellitus ([place]) 02/19/2019   Primary hypertension 02/19/2019   Elevated alkaline phosphatase level 01/15/2019   Stage 3 chronic kidney disease ([place]) 06/15/2017   History of colonic polyps 05/09/2017   Presbycusis of both ears 06/04/2016   Hypothyroidism due to acquired atrophy of thyroid 04/26/2016   Vitamin D deficiency 04/26/2016   Status post total left knee replacement 09/15/2015   Statin intolerance 09/11/2015   Diabetic neuropathy, type II diabetes mellitus ([place]) 07/30/2015   Abnormal chest x-ray 11/27/2014   Primary insomnia 04/12/2013   GERD (gastroesophageal reflux disease) 04/25/2012   Fatty liver 08/09/2011   Esophageal dysphagia 08/09/2011   Type 2 diabetes mellitus with diabetic nephropathy, without long-term current use of insulin ([place]) 08/09/2011   Thank you,  Genene Churn, [place]  [place], [place] 06/11/2021, 10:52 AM  [place]  [address] [place], [place], [address] Phone: [phone]   Fax:  [phone]   Name: Lisa Sutton MRN: [id number] Date of Birth:  May 10, 1934

## 2021-06-16 ENCOUNTER — Other Ambulatory Visit: Payer: Self-pay

## 2021-06-17 ENCOUNTER — Ambulatory Visit (HOSPITAL_COMMUNITY): Payer: Medicare Other | Admitting: Occupational Therapy

## 2021-06-17 ENCOUNTER — Encounter (HOSPITAL_COMMUNITY): Payer: Self-pay | Admitting: Speech Pathology

## 2021-06-17 ENCOUNTER — Ambulatory Visit (HOSPITAL_COMMUNITY): Payer: Medicare Other | Admitting: Speech Pathology

## 2021-06-17 ENCOUNTER — Encounter (HOSPITAL_COMMUNITY): Payer: Self-pay | Admitting: Occupational Therapy

## 2021-06-17 ENCOUNTER — Other Ambulatory Visit: Payer: Self-pay

## 2021-06-17 DIAGNOSIS — R29818 Other symptoms and signs involving the nervous system: Secondary | ICD-10-CM

## 2021-06-17 DIAGNOSIS — R41841 Cognitive communication deficit: Secondary | ICD-10-CM

## 2021-06-17 DIAGNOSIS — R4701 Aphasia: Secondary | ICD-10-CM | POA: Diagnosis not present

## 2021-06-17 DIAGNOSIS — R278 Other lack of coordination: Secondary | ICD-10-CM

## 2021-06-17 NOTE — Therapy (Signed)
[place], [place], [address] Phone: [phone]   Fax:  [phone]  Occupational Therapy Reassessment, Treatment, Discharge Summary  Patient Details  Name: Lisa Sutton MRN: [id number] Date of Birth: 02/23/1934 Referring Provider (OT): Etter Sjogren, NP   Encounter Date: 06/17/2021   OT End of Session - 06/17/21 1157     Visit Number 5    Number of Visits 6    Date for OT Re-Evaluation 06/19/21    Authorization Type UHC Medicare, $20 copay    Progress Note Due on Visit 10    OT Start Time [phone]    OT Stop Time 1025    OT Time Calculation (min) 38 min    Activity Tolerance Patient tolerated treatment well    Behavior During Therapy [facility] for tasks assessed/performed             Past Medical History:  Diagnosis Date   Anxiety    Arthritis    Cataract    Dysphagia    'sometimes but not a major issue' been checked out by GI (per pt)   Dysrhythmia    'heart used to skip but doesn't anymore' was checked out by Dr. Ron Parker late '90s, everything checked out ok and not had any skipping since (all per pt)   Essential hypertension, benign    Fatty liver    GERD (gastroesophageal reflux disease)    H/O hypercalcemia 04/12/2017   Hepatitis B    had at age 28, 'GI doc said it's gone away'   Hepatitis B surface antigen positive    High cholesterol    History of hiatal hernia    Hypercalcemia    Hypothyroidism    Kidney stone August 2014   Patient was seen at [facility]   Obesity    Osteoporosis    PONV (postoperative nausea and vomiting)    Type 2 diabetes mellitus ([place])     Past Surgical History:  Procedure Laterality Date   ABDOMINAL HYSTERECTOMY     BILATERAL CARPAL TUNNEL RELEASE     Dr. Marlou Sa at surgical center   BIOPSY  05/26/2017   Procedure: BIOPSY;  Surgeon: Rogene Houston, MD;  [facility];  Service: Endoscopy;;  colon   BIOPSY  10/11/2018   Procedure: BIOPSY;  Surgeon: Rogene Houston, MD;  [facility];  Service: Endoscopy;;   CATARACT EXTRACTION, BILATERAL     COLONOSCOPY N/A 05/26/2017   Procedure: COLONOSCOPY;  Surgeon: Rogene Houston, MD;  [facility];  Service: Endoscopy;  Laterality: N/A;  10:30   Deviated septum repair     ESOPHAGEAL DILATION N/A 08/16/2019   Procedure: ESOPHAGEAL DILATION;  Surgeon: Rogene Houston, MD;  [facility];  Service: Endoscopy;  Laterality: N/A;   ESOPHAGOGASTRODUODENOSCOPY N/A 07/28/2016   Procedure: ESOPHAGOGASTRODUODENOSCOPY (EGD);  Surgeon: Rogene Houston, MD;  [facility];  Service: Endoscopy;  Laterality: N/A;  300 - moved to 3/7 @ 1:00   ESOPHAGOGASTRODUODENOSCOPY N/A 08/16/2019   Procedure: ESOPHAGOGASTRODUODENOSCOPY (EGD);  Surgeon: Rogene Houston, MD;  [facility];  Service: Endoscopy;  Laterality: N/A;  [place] N/A 10/11/2018   Procedure: FLEXIBLE SIGMOIDOSCOPY;  Surgeon: Rogene Houston, MD;  [facility];  Service: Endoscopy;  Laterality: N/A;  unable to complete colonoscopy    IR CT HEAD LTD  03/07/2021   IR PERCUTANEOUS ART THROMBECTOMY/INFUSION  INTRACRANIAL INC DIAG ANGIO  03/07/2021   IR US GUIDE VASC ACCESS RIGHT  03/07/2021   KNEE ARTHROPLASTY Left 09/15/2015   Procedure: COMPUTER ASSISTED TOTAL KNEE ARTHROPLASTY;  Surgeon: Marybelle Killings, MD;  Location: [place];  Service: Orthopedics;  Laterality: Left;   KNEE ARTHROSCOPY     left knee 2003   RADIOLOGY WITH ANESTHESIA N/A 03/07/2021   Procedure: IR WITH ANESTHESIA;  Surgeon: Corrie Mckusick, DO;  Location: [place];  Service: Anesthesiology;  Laterality: N/A;    There were no vitals filed for this visit.   Subjective Assessment - 06/17/21 0949     Subjective  S: Maybe but not very good. (when asked if anything had improved since beginning therapy).    Currently in Pain? No/denies                [facility] OT Assessment - 06/17/21 0949       Assessment    Medical Diagnosis s/p left MCA CVA & thrombectomy      Precautions   Precautions None      Coordination   Right 9 Hole Peg Test 39.65"   1'04" previous                     OT Treatments/Exercises (OP) - 06/17/21 1009       Exercises   Exercises Hand      Fine Motor Coordination (Hand/Wrist)   Fine Motor Coordination Small Pegboard;Manipulating coins    Small Pegboard pt completed one pattern (easy difficulty level) with colored pegs. Activity working on Fish farm manager and following a pattern. Also working on coordination with [place].    Manipulating coins Picked up 5 coins one at a time transferring them from fingertip to palm to place into slotted container. Occasional mod difficulty placing into container.    Handwriting Provided visual demonstration on lined paper. Attempting to write first name on paper multiple times. Pt unable to spell her name, unable to follow verbal sequencing, and unable to copy OT's example. Perseverating on letters and the name being incorrect.                      OT Short Term Goals - 06/17/21 0953       OT SHORT TERM GOAL #1   Title Pt will be provided with and educated on HEP to increase use of dominant RUE during ADL completion.    Time 3    Period Weeks    Status Achieved    Target Date 06/19/21      OT SHORT TERM GOAL #2   Title Pt will increase right fine motor coordination required for self-feeding by completing 9 hole peg test in under 50".    Time 3    Period Weeks    Status Achieved      OT SHORT TERM GOAL #3   Title Pt will be educated on use of AE available to compensate for sensation deficits when attempting to use RUE during ADLs.    Time 3    Period Weeks    Status Achieved                      Plan - 06/17/21 1157     Clinical Impression Statement A: Reassessment completed this session. Pt has met all goals and is demonstrating improved fine motor coordination during tasks.  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 Pt with perseveration during handwriting activities, OT providing multiple forms of cues and visual assist however no improvement in success. Discussed goals and progress with pt and son, pt agreeable to discharge today.    Body Structure / Function / Physical Skills ADL;Endurance;UE functional use;Proprioception;Coordination;IADL    Plan P: Discharge pt    OT Home Exercise Plan 1/12: coordination HEP    Consulted and Agree with Plan of Care Patient;Family member/caregiver    Family Member Consulted son             Patient will benefit from skilled therapeutic intervention in order to improve the following deficits and impairments:   Body Structure / Function / Physical Skills: ADL, Endurance, UE functional use, Proprioception, Coordination, IADL       Visit Diagnosis: Other lack of coordination  Other symptoms and signs involving the nervous system    Problem List Patient Active Problem List   Diagnosis Date Noted   Hypertensive emergency    Acute ischemic left MCA stroke ([place]) 03/07/2021   Cerebrovascular accident (CVA) ([place])    Encounter for intubation    Pruritus ani 08/05/2020   History of hepatitis B 02/05/2020   Chronic diarrhea 07/24/2019   Proteinuria 07/16/2019   GAD (generalized anxiety disorder) 03/02/2019   Controlled substance agreement signed 03/02/2019   Hyperlipidemia associated with type 2 diabetes mellitus ([place]) 02/19/2019   CKD stage 3 due to type 2 diabetes mellitus ([place]) 02/19/2019   Primary hypertension 02/19/2019   Elevated alkaline phosphatase level 01/15/2019   Stage 3 chronic kidney disease ([place]) 06/15/2017   History of colonic polyps 05/09/2017   Presbycusis of both ears 06/04/2016   Hypothyroidism due to acquired atrophy of thyroid 04/26/2016   Vitamin D deficiency 04/26/2016   Status post total left knee replacement  09/15/2015   Statin intolerance 09/11/2015   Diabetic neuropathy, type II diabetes mellitus ([place]) 07/30/2015   Abnormal chest x-ray 11/27/2014   Primary insomnia 04/12/2013   GERD (gastroesophageal reflux disease) 04/25/2012   Fatty liver 08/09/2011   Esophageal dysphagia 08/09/2011   Type 2 diabetes mellitus with diabetic nephropathy, without long-term current use of insulin ([place]) 08/09/2011    Guadelupe Sabin, OTR/L  [phone] 06/17/2021, 12:45 PM  [place], [place], [address] Phone: [phone]   Fax:  [phone]  Name: Lisa Sutton MRN: [id number] Date of Birth: [birth year]  OCCUPATIONAL THERAPY DISCHARGE SUMMARY  Visits from Start of Care: 5  Current functional level related to goals / functional outcomes: See above. Pt has improved coordination and volitional use of RUE during functional tasks.    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 Significant difficulty with handwriting.    Education / Equipment: Compensatory techniques for task completion, coordination HEP   Patient agrees to discharge. Patient goals were met. Patient is being discharged due to meeting the stated rehab goals.Marland Kitchen

## 2021-06-17 NOTE — Therapy (Signed)
[place], [place], [address] Phone: [phone]   Fax:  [phone]  Speech Language Pathology Treatment  Patient Details  Name: Lisa Sutton MRN: [id number] Date of Birth: 01-25-1934 Referring Provider (SLP): Etter Sjogren, [facility]   Encounter Date: 06/17/2021   End of Session - 06/17/21 0913     Visit Number 5    Number of Visits 10    Date for SLP Re-Evaluation 07/09/21    Authorization Type UHC Medicare   UHC Med 05/24/2021-05/23/2022  OOP 3,600  co-pay 20  no DED, no visit limit, no auth   SLP Start Time 0902    SLP Stop Time  0947    SLP Time Calculation (min) 45 min    Activity Tolerance Patient tolerated treatment well             Past Medical History:  Diagnosis Date   Anxiety    Arthritis    Cataract    Dysphagia    'sometimes but not a major issue' been checked out by GI (per pt)   Dysrhythmia    'heart used to skip but doesn't anymore' was checked out by Dr. Ron Parker late '90s, everything checked out ok and not had any skipping since (all per pt)   Essential hypertension, benign    Fatty liver    GERD (gastroesophageal reflux disease)    H/O hypercalcemia 04/12/2017   Hepatitis B    had at age 61, 'GI doc said it's gone away'   Hepatitis B surface antigen positive    High cholesterol    History of hiatal hernia    Hypercalcemia    Hypothyroidism    Kidney stone August 2014   Patient was seen at [facility]   Obesity    Osteoporosis    PONV (postoperative nausea and vomiting)    Type 2 diabetes mellitus ([place])     Past Surgical History:  Procedure Laterality Date   ABDOMINAL HYSTERECTOMY     BILATERAL CARPAL TUNNEL RELEASE     Dr. Marlou Sa at surgical center   BIOPSY  05/26/2017   Procedure: BIOPSY;  Surgeon: Rogene Houston, MD;  [facility];  Service: Endoscopy;;  colon   BIOPSY  10/11/2018   Procedure: BIOPSY;  Surgeon: Rogene Houston, MD;  [facility];   Service: Endoscopy;;   CATARACT EXTRACTION, BILATERAL     COLONOSCOPY N/A 05/26/2017   Procedure: COLONOSCOPY;  Surgeon: Rogene Houston, MD;  [facility];  Service: Endoscopy;  Laterality: N/A;  10:30   Deviated septum repair     ESOPHAGEAL DILATION N/A 08/16/2019   Procedure: ESOPHAGEAL DILATION;  Surgeon: Rogene Houston, MD;  [facility];  Service: Endoscopy;  Laterality: N/A;   ESOPHAGOGASTRODUODENOSCOPY N/A 07/28/2016   Procedure: ESOPHAGOGASTRODUODENOSCOPY (EGD);  Surgeon: Rogene Houston, MD;  [facility];  Service: Endoscopy;  Laterality: N/A;  300 - moved to 3/7 @ 1:00   ESOPHAGOGASTRODUODENOSCOPY N/A 08/16/2019   Procedure: ESOPHAGOGASTRODUODENOSCOPY (EGD);  Surgeon: Rogene Houston, MD;  [facility];  Service: Endoscopy;  Laterality: N/A;  [place] N/A 10/11/2018   Procedure: FLEXIBLE SIGMOIDOSCOPY;  Surgeon: Rogene Houston, MD;  [facility];  Service: Endoscopy;  Laterality: N/A;  unable to complete colonoscopy    IR CT HEAD LTD  03/07/2021   IR PERCUTANEOUS ART THROMBECTOMY/INFUSION INTRACRANIAL INC DIAG ANGIO  03/07/2021   IR US GUIDE VASC ACCESS RIGHT  03/07/2021   KNEE ARTHROPLASTY Left 09/15/2015   Procedure: COMPUTER ASSISTED TOTAL KNEE ARTHROPLASTY;  Surgeon: Marybelle Killings, MD;  Location: [place];  Service: Orthopedics;  Laterality: Left;   KNEE ARTHROSCOPY     left knee 2003   RADIOLOGY WITH ANESTHESIA N/A 03/07/2021   Procedure: IR WITH ANESTHESIA;  Surgeon: Corrie Mckusick, DO;  Location: [place];  Service: Anesthesiology;  Laterality: N/A;    There were no vitals filed for this visit.   Subjective Assessment - 06/17/21 0906     Subjective "I just feel al little dizzy like, just don't feel good."    Patient is accompained by: Family member    Currently in Pain? No/denies                ADULT SLP TREATMENT - 06/17/21 0907       General Information    Behavior/Cognition Alert;Cooperative;Pleasant mood    Patient Positioning Upright in chair    Oral care provided N/A    HPI Lisa Sutton is a 88 y.o. female with PMH significant for HTN, GERD, Hep B, HLD, Obesity, osteoporosis who initially presented to [facility] ED with aphasia.  CT angio was done which showed left M2 occlusion, she was brought to [facility], underwent mechanical thrombectomy. MRI was remarkable for acute infarcts in the L MCA territory and right parietal cortex.  Pt was intubated less than 24 hrs on 10/15.  Pt has a hx of esophageal dysphagia. She received SLP therapy at [facility] during acute admission, [facility] for 2 weeks SNF, and was discharged from home health SLP services last week. She was referred for outpatient SLP therapy by Etter Sjogren, PA-C.      Treatment Provided   Treatment provided Cognitive-Linquistic      Pain Assessment   Pain Assessment No/denies pain      Cognitive-Linquistic Treatment   Treatment focused on Aphasia;Patient/family/caregiver education;Apraxia    Skilled Treatment SLP provided skilled intervention strategies to stimulate accurate word productions, cues to pair a gesture with verbal attempts, guided conversation with written cues, visual tracking during oral reading and copying letters, and cues for word finding strategies (provide function of object).      Assessment / Recommendations / Plan   Plan Continue with current plan of care      Progression Toward Goals   Progression toward goals Progressing toward goals                SLP Short Term Goals - 06/17/21 0916       SLP SHORT TERM GOAL #1   Title Pt will describe objects and pictures by providing at least three salient features as judged by clinician with 80% acc when provided mi/mod cues.    Baseline 70%    Time 4    Period Weeks    Status On-going    Target Date 06/29/21      SLP SHORT TERM GOAL #2   Title Pt will implement word-finding  strategies with 80% accuracy when unable to verbalize desired word in conversation/functional tasks with mod assist.    Baseline 65%    Time 4    Period Weeks    Status On-going    Target Date 06/29/21      SLP SHORT TERM GOAL #3   Title Pt will complete reading comprehension tasks (short phrases) with 80% acc and min assist via multiple choice response.  Baseline max assist    Time 4    Period Weeks    Status On-going    Target Date 06/29/21      SLP SHORT TERM GOAL #4   Title Pt will respond to open-ended questions with 80% acc and min/mod cues with use of personalized communication template (personally relevant vocabulary).    Baseline Will create personalized communication template over the first three sessions to use going forward    Time 4    Period Weeks    Status On-going    Target Date 06/29/21      SLP SHORT TERM GOAL #5   Title Pt will repeat short functional phrases/sentences with 80% acc when provided mi/mod cues for error awareness.    Baseline 40%    Time 4    Period Weeks    Status On-going    Target Date 06/29/21                Plan - 06/17/21 0914     Clinical Impression Statement Pt continues to be alert and motivated for therapy.  She named low frequency pictured objects with 85% acc without cues and stated associations with objects with 100% acc with min cues. She repeated functional phrases with 100% acc when provided SLP model. She completed auditory comprehension task of matching short phrase to F=3 multiple choice responses with 90% acc with min cues provided. Continue to target word finding strategies in conversation next session.    Speech Therapy Frequency 2x / week    Duration 8 weeks    Treatment/Interventions Compensatory strategies;Cueing hierarchy;Patient/family education;Functional tasks;Multimodal communcation approach;SLP instruction and feedback;Language facilitation    Potential to Achieve Goals Good    SLP Home Exercise Plan Pt  will completed HEP as assigned to facilitate carryover of treatment strategies and techniques in home environment with use of written cues as needed.    Consulted and Agree with Plan of Care Patient;Family member/caregiver    Family Member Dugger Son, [place]             Patient will benefit from skilled therapeutic intervention in order to improve the following deficits and impairments:   Aphasia  Cognitive communication deficit    Problem List Patient Active Problem List   Diagnosis Date Noted   Hypertensive emergency    Acute ischemic left MCA stroke ([place]) 03/07/2021   Cerebrovascular accident (CVA) ([place])    Encounter for intubation    Pruritus ani 08/05/2020   History of hepatitis B 02/05/2020   Chronic diarrhea 07/24/2019   Proteinuria 07/16/2019   GAD (generalized anxiety disorder) 03/02/2019   Controlled substance agreement signed 03/02/2019   Hyperlipidemia associated with type 2 diabetes mellitus ([place]) 02/19/2019   CKD stage 3 due to type 2 diabetes mellitus ([place]) 02/19/2019   Primary hypertension 02/19/2019   Elevated alkaline phosphatase level 01/15/2019   Stage 3 chronic kidney disease ([place]) 06/15/2017   History of colonic polyps 05/09/2017   Presbycusis of both ears 06/04/2016   Hypothyroidism due to acquired atrophy of thyroid 04/26/2016   Vitamin D deficiency 04/26/2016   Status post total left knee replacement 09/15/2015   Statin intolerance 09/11/2015   Diabetic neuropathy, type II diabetes mellitus ([place]) 07/30/2015   Abnormal chest x-ray 11/27/2014   Primary insomnia 04/12/2013   GERD (gastroesophageal reflux disease) 04/25/2012   Fatty liver 08/09/2011   Esophageal dysphagia 08/09/2011   Type 2 diabetes mellitus with diabetic nephropathy, without long-term current use of insulin ([place]) 08/09/2011   Thank  you,  Genene Churn, [place]  Teddy Spike 06/17/2021, 9:35 AM  [place], [place], [address] Phone: [phone]   Fax:  [phone]   Name: Lisa Sutton MRN: [id number] Date of Birth: [birth year]

## 2021-06-17 NOTE — Patient Instructions (Signed)
Ideational Apraxia: The main feature is an impairment in carrying out sequences of actions requiring the use of various objects in the correct order necessary to achieve an intended purpose. The syndrome cannot be explained as being due to paresis, aphasia, impaired visual recognition, mental deterioration or a combination of these disorders. It must be considered as a higher order motor disturbance. Perservation plays an important role but cannot explain the syndrome.   Limb-kinetic apraxia: is a distinctive disorder affecting the performance of finger and hand postures and movements over and above a corticospinal or basal ganglion deficit.  Home Strategies:  Make tasks as simple as possible Use visual cues or demonstrations as needed Create a step by step visual with pictures to follow for tasks that seem to be more difficult Practice frequently completed tasks daily to learn a routine and improve muscle memory.

## 2021-06-18 ENCOUNTER — Encounter (HOSPITAL_COMMUNITY): Payer: Medicare Other | Admitting: Speech Pathology

## 2021-06-18 ENCOUNTER — Other Ambulatory Visit: Payer: Self-pay

## 2021-06-18 ENCOUNTER — Encounter (HOSPITAL_COMMUNITY): Payer: Medicare Other | Admitting: Occupational Therapy

## 2021-06-18 NOTE — Patient Outreach (Signed)
[facility]) Care Management  06/18/2021  Lisa Sutton Grade January 29, 1934 [id number]   First telephone outreach attempt to obtain mRS. No answer. Unable to leave message for returned call.  Philmore Pali [facility] Management Assistant [phone]

## 2021-06-23 ENCOUNTER — Other Ambulatory Visit: Payer: Self-pay

## 2021-06-23 NOTE — Patient Outreach (Signed)
[facility]) Care Management  06/23/2021  Lisa Sutton [birth year] [id number]   Telephone outreach to patient to obtain mRS was successfully completed. MRS= 3   [place] Care Management Assistant

## 2021-06-23 NOTE — Patient Outreach (Signed)
[place] [facility]) Care Management  06/23/2021  Delaine Hernandez Marini [birth year] [id number]   Second telephone outreach attempt to obtain mRS. No answer. Left message for returned call.  Philmore Pali [facility] Management Assistant [phone]

## 2021-06-24 ENCOUNTER — Other Ambulatory Visit: Payer: Self-pay

## 2021-06-24 ENCOUNTER — Encounter (HOSPITAL_COMMUNITY): Payer: Self-pay | Admitting: Speech Pathology

## 2021-06-24 ENCOUNTER — Ambulatory Visit (HOSPITAL_COMMUNITY): Payer: Medicare Other | Attending: Family | Admitting: Speech Pathology

## 2021-06-24 DIAGNOSIS — R4701 Aphasia: Secondary | ICD-10-CM | POA: Insufficient documentation

## 2021-06-24 DIAGNOSIS — R41841 Cognitive communication deficit: Secondary | ICD-10-CM | POA: Diagnosis present

## 2021-06-24 NOTE — Therapy (Signed)
[place], [place], [address] Phone: [phone]   Fax:  [phone]  Speech Language Pathology Treatment  Patient Details  Name: Lisa Sutton MRN: [id number] Date of Birth: 17-Jun-1933 Referring Provider (SLP): Etter Sjogren, [facility]   Encounter Date: 06/24/2021   End of Session - 06/24/21 0953     Visit Number 6    Number of Visits 10    Date for SLP Re-Evaluation 07/09/21    Authorization Type UHC Medicare   UHC Med 05/24/2021-05/23/2022  OOP 3,600  co-pay 20  no DED, no visit limit, no auth   SLP Start Time 0945    SLP Stop Time  1030    SLP Time Calculation (min) 45 min    Activity Tolerance Patient tolerated treatment well             Past Medical History:  Diagnosis Date   Anxiety    Arthritis    Cataract    Dysphagia    'sometimes but not a major issue' been checked out by GI (per pt)   Dysrhythmia    'heart used to skip but doesn't anymore' was checked out by Dr. Ron Parker late '90s, everything checked out ok and not had any skipping since (all per pt)   Essential hypertension, benign    Fatty liver    GERD (gastroesophageal reflux disease)    H/O hypercalcemia 04/12/2017   Hepatitis B    had at age 31, 'GI doc said it's gone away'   Hepatitis B surface antigen positive    High cholesterol    History of hiatal hernia    Hypercalcemia    Hypothyroidism    Kidney stone August 2014   Patient was [facility] at [facility]   Obesity    Osteoporosis    PONV (postoperative nausea and vomiting)    Type 2 diabetes mellitus ([place])     Past Surgical History:  Procedure Laterality Date   ABDOMINAL HYSTERECTOMY     BILATERAL CARPAL TUNNEL RELEASE     Dr. Marlou Sa at surgical center   BIOPSY  05/26/2017   Procedure: BIOPSY;  Surgeon: Rogene Houston, MD;  [facility];  Service: Endoscopy;;  colon   BIOPSY  10/11/2018   Procedure: BIOPSY;  Surgeon: Rogene Houston, MD;  [facility];   Service: Endoscopy;;   CATARACT EXTRACTION, BILATERAL     COLONOSCOPY N/A 05/26/2017   Procedure: COLONOSCOPY;  Surgeon: Rogene Houston, MD;  [facility];  Service: Endoscopy;  Laterality: N/A;  10:30   Deviated septum repair     ESOPHAGEAL DILATION N/A 08/16/2019   Procedure: ESOPHAGEAL DILATION;  Surgeon: Rogene Houston, MD;  [facility];  Service: Endoscopy;  Laterality: N/A;   ESOPHAGOGASTRODUODENOSCOPY N/A 07/28/2016   Procedure: ESOPHAGOGASTRODUODENOSCOPY (EGD);  Surgeon: Rogene Houston, MD;  [facility];  Service: Endoscopy;  Laterality: N/A;  300 - moved to 3/7 @ 1:00   ESOPHAGOGASTRODUODENOSCOPY N/A 08/16/2019   Procedure: ESOPHAGOGASTRODUODENOSCOPY (EGD);  Surgeon: Rogene Houston, MD;  [facility];  Service: Endoscopy;  Laterality: N/A;  [place] N/A 10/11/2018   Procedure: FLEXIBLE SIGMOIDOSCOPY;  Surgeon: Rogene Houston, MD;  [facility];  Service: Endoscopy;  Laterality: N/A;  unable to complete colonoscopy    IR CT HEAD LTD  03/07/2021   IR PERCUTANEOUS ART THROMBECTOMY/INFUSION INTRACRANIAL INC DIAG ANGIO  03/07/2021   IR US GUIDE VASC ACCESS RIGHT  03/07/2021   KNEE ARTHROPLASTY Left 09/15/2015   Procedure: COMPUTER ASSISTED TOTAL KNEE ARTHROPLASTY;  Surgeon: Marybelle Killings, MD;  Location: [place];  Service: Orthopedics;  Laterality: Left;   KNEE ARTHROSCOPY     left knee 2003   RADIOLOGY WITH ANESTHESIA N/A 03/07/2021   Procedure: IR WITH ANESTHESIA;  Surgeon: Corrie Mckusick, DO;  Location: [place];  Service: Anesthesiology;  Laterality: N/A;    There were no vitals filed for this visit.   Subjective Assessment - 06/24/21 0951     Subjective "I didn't do too much in my book... I get discouraged."    Currently in Pain? No/denies                ADULT SLP TREATMENT - 06/24/21 0952       General Information   Behavior/Cognition Alert;Cooperative;Pleasant mood    Patient  Positioning Upright in chair    Oral care provided N/A    HPI Lisa Sutton is a 88 y.o. female with PMH significant for HTN, GERD, Hep B, HLD, Obesity, osteoporosis who initially presented to [facility] ED with aphasia.  CT angio was done which showed left M2 occlusion, she was brought to [facility], underwent mechanical thrombectomy. MRI was remarkable for acute infarcts in the L MCA territory and right parietal cortex.  Pt was intubated less than 24 hrs on 10/15.  Pt has a hx of esophageal dysphagia. She received SLP therapy at [facility] during acute admission, [facility] for 2 weeks SNF, and was discharged from home health SLP services last week. She was referred for outpatient SLP therapy by Etter Sjogren, PA-C.      Treatment Provided   Treatment provided Cognitive-Linquistic      Pain Assessment   Pain Assessment No/denies pain      Cognitive-Linquistic Treatment   Treatment focused on Aphasia;Patient/family/caregiver education;Apraxia    Skilled Treatment SLP provided skilled intervention strategies to stimulate accurate word productions, cues to pair a gesture with verbal attempts, guided conversation with written cues, visual tracking during oral reading and copying letters, and cues for word finding strategies (provide function of object).      Assessment / Recommendations / Plan   Plan Continue with current plan of care      Progression Toward Goals   Progression toward goals Progressing toward goals              SLP Education - 06/24/21 0952     Education Details Encouraged Pt to complete HEP    Person(s) Educated Patient    Methods Explanation;Handout    Comprehension Verbalized understanding              SLP Short Term Goals - 06/24/21 0954       SLP SHORT TERM GOAL #1   Title Pt will describe objects and pictures by providing at least three salient features as judged by clinician with 80% acc when provided mi/mod cues.    Baseline 70%     Time 4    Period Weeks    Status On-going    Target Date 06/29/21      SLP SHORT TERM GOAL #2   Title Pt will implement word-finding strategies with 80% accuracy when unable to verbalize desired word in conversation/functional tasks with mod assist.    Baseline 65%    Time 4    Period Weeks    Status On-going  Target Date 06/29/21      SLP SHORT TERM GOAL #3   Title Pt will complete reading comprehension tasks (short phrases) with 80% acc and min assist via multiple choice response.    Baseline max assist    Time 4    Period Weeks    Status On-going    Target Date 06/29/21      SLP SHORT TERM GOAL #4   Title Pt will respond to open-ended questions with 80% acc and min/mod cues with use of personalized communication template (personally relevant vocabulary).    Baseline Will create personalized communication template over the first three sessions to use going forward    Time 4    Period Weeks    Status On-going    Target Date 06/29/21      SLP SHORT TERM GOAL #5   Title Pt will repeat short functional phrases/sentences with 80% acc when provided mi/mod cues for error awareness.    Baseline 40%    Time 4    Period Weeks    Status On-going    Target Date 06/29/21                Plan - 06/24/21 0954     Clinical Impression Statement Pt continues to be alert and motivated for therapy. She brought her folder, but had not completed any of the HEP assigned. SLP suggested that she have her son help her get started (and spoke with son after our session today). She completed reading comprehension task of matching short phrase to F=3 multiple choice responses with 100% acc with min cues provided. She described pictured objects by providing three salient features with 100% acc with min cues/prompts from SLP. SLP encouraged Pt stated the category, function, appearance, associations, and how she feels about the pictured item. She answered open ended questions with 100% acc with min  cues. She continues to experience paraphasic errors, but they are very close approximations. Goals will be updated after the next session.     Speech Therapy Frequency 2x / week    Duration 8 weeks    Treatment/Interventions Compensatory strategies;Cueing hierarchy;Patient/family education;Functional tasks;Multimodal communcation approach;SLP instruction and feedback;Language facilitation    Potential to Achieve Goals Good    SLP Home Exercise Plan Pt will completed HEP as assigned to facilitate carryover of treatment strategies and techniques in home environment with use of written cues as needed.    Consulted and Agree with Plan of Care Patient;Family member/caregiver    Family Member Finger Son, [place]             Patient will benefit from skilled therapeutic intervention in order to improve the following deficits and impairments:   Aphasia  Cognitive communication deficit    Problem List Patient Active Problem List   Diagnosis Date Noted   Hypertensive emergency    Acute ischemic left MCA stroke ([place]) 03/07/2021   Cerebrovascular accident (CVA) ([place])    Encounter for intubation    Pruritus ani 08/05/2020   History of hepatitis B 02/05/2020   Chronic diarrhea 07/24/2019   Proteinuria 07/16/2019   GAD (generalized anxiety disorder) 03/02/2019   Controlled substance agreement signed 03/02/2019   Hyperlipidemia associated with type 2 diabetes mellitus ([place]) 02/19/2019   CKD stage 3 due to type 2 diabetes mellitus ([place]) 02/19/2019   Primary hypertension 02/19/2019   Elevated alkaline phosphatase level 01/15/2019   Stage 3 chronic kidney disease ([place]) 06/15/2017   History of colonic polyps 05/09/2017   Presbycusis of  both ears 06/04/2016   Hypothyroidism due to acquired atrophy of thyroid 04/26/2016   Vitamin D deficiency 04/26/2016   Status post total left knee replacement 09/15/2015   Statin intolerance 09/11/2015   Diabetic neuropathy, type II diabetes mellitus ([place])  07/30/2015   Abnormal chest x-ray 11/27/2014   Primary insomnia 04/12/2013   GERD (gastroesophageal reflux disease) 04/25/2012   Fatty liver 08/09/2011   Esophageal dysphagia 08/09/2011   Type 2 diabetes mellitus with diabetic nephropathy, without long-term current use of insulin ([place]) 08/09/2011   Thank you,  Genene Churn, [place]  [place], [place] 06/24/2021, 10:41 AM  [place], [place], [address] Phone: [phone]   Fax:  [phone]   Name: Lisa Sutton MRN: [id number] Date of Birth: [birth year]

## 2021-06-25 ENCOUNTER — Encounter (HOSPITAL_COMMUNITY): Payer: Self-pay | Admitting: Speech Pathology

## 2021-06-25 ENCOUNTER — Ambulatory Visit (HOSPITAL_COMMUNITY): Payer: Medicare Other | Admitting: Speech Pathology

## 2021-06-25 DIAGNOSIS — R41841 Cognitive communication deficit: Secondary | ICD-10-CM

## 2021-06-25 DIAGNOSIS — R4701 Aphasia: Secondary | ICD-10-CM

## 2021-06-25 NOTE — Therapy (Signed)
[place], [place], [address] Phone: [phone]   Fax:  [phone]  Speech Language Pathology Treatment  Patient Details  Name: Lisa Sutton MRN: [id number] Date of Birth: 15-Jan-1934 Referring Provider (SLP): Etter Sjogren, [facility]   Encounter Date: 06/25/2021   End of Session - 06/25/21 0786     Visit Number 7    Number of Visits 10    Date for SLP Re-Evaluation 07/09/21   before 10th visit   Authorization Type [facility] Medicare   [employer] Med 05/24/2021-05/23/2022  OOP 3,600  co-pay 20  no DED, no visit limit, no auth   SLP Start Time 0906    SLP Stop Time  0952    SLP Time Calculation (min) 46 min    Activity Tolerance Patient tolerated treatment well             Past Medical History:  Diagnosis Date   Anxiety    Arthritis    Cataract    Dysphagia    'sometimes but not a major issue' been checked out by GI (per pt)   Dysrhythmia    'heart used to skip but doesn't anymore' was checked out by Dr. Ron Parker late '90s, everything checked out ok and not had any skipping since (all per pt)   Essential hypertension, benign    Fatty liver    GERD (gastroesophageal reflux disease)    H/O hypercalcemia 04/12/2017   Hepatitis B    had at age 62, 'GI doc said it's gone away'   Hepatitis B surface antigen positive    High cholesterol    History of hiatal hernia    Hypercalcemia    Hypothyroidism    Kidney stone August 2014   Patient was seen at [facility]   Obesity    Osteoporosis    PONV (postoperative nausea and vomiting)    Type 2 diabetes mellitus ([place])     Past Surgical History:  Procedure Laterality Date   ABDOMINAL HYSTERECTOMY     BILATERAL CARPAL TUNNEL RELEASE     Dr. Marlou Sa at surgical center   BIOPSY  05/26/2017   Procedure: BIOPSY;  Surgeon: Rogene Houston, MD;  [facility];  Service: Endoscopy;;  colon   BIOPSY  10/11/2018   Procedure: BIOPSY;  Surgeon: Rogene Houston, MD;  [facility];  Service: Endoscopy;;   CATARACT EXTRACTION, BILATERAL     COLONOSCOPY N/A 05/26/2017   Procedure: COLONOSCOPY;  Surgeon: Rogene Houston, MD;  [facility];  Service: Endoscopy;  Laterality: N/A;  10:30   Deviated septum repair     ESOPHAGEAL DILATION N/A 08/16/2019   Procedure: ESOPHAGEAL DILATION;  Surgeon: Rogene Houston, MD;  [facility];  Service: Endoscopy;  Laterality: N/A;   ESOPHAGOGASTRODUODENOSCOPY N/A 07/28/2016   Procedure: ESOPHAGOGASTRODUODENOSCOPY (EGD);  Surgeon: Rogene Houston, MD;  [facility];  Service: Endoscopy;  Laterality: N/A;  300 - moved to 3/7 @ 1:00   ESOPHAGOGASTRODUODENOSCOPY N/A 08/16/2019   Procedure: ESOPHAGOGASTRODUODENOSCOPY (EGD);  Surgeon: Rogene Houston, MD;  [facility];  Service: Endoscopy;  Laterality: N/A;  [place] N/A 10/11/2018   Procedure: FLEXIBLE SIGMOIDOSCOPY;  Surgeon: Rogene Houston, MD;  [facility];  Service: Endoscopy;  Laterality: N/A;  unable to complete colonoscopy    IR CT HEAD LTD  03/07/2021   IR PERCUTANEOUS ART THROMBECTOMY/INFUSION INTRACRANIAL  INC DIAG ANGIO  03/07/2021   IR US GUIDE VASC ACCESS RIGHT  03/07/2021   KNEE ARTHROPLASTY Left 09/15/2015   Procedure: COMPUTER ASSISTED TOTAL KNEE ARTHROPLASTY;  Surgeon: Marybelle Killings, MD;  Location: [place];  Service: Orthopedics;  Laterality: Left;   KNEE ARTHROSCOPY     left knee 2003   RADIOLOGY WITH ANESTHESIA N/A 03/07/2021   Procedure: IR WITH ANESTHESIA;  Surgeon: Corrie Mckusick, DO;  Location: [place];  Service: Anesthesiology;  Laterality: N/A;    There were no vitals filed for this visit.   Subjective Assessment - 06/25/21 0912     Subjective "I did all my homework."    Currently in Pain? No/denies               ADULT SLP TREATMENT - 06/25/21 0914       General Information   Behavior/Cognition Alert;Cooperative;Pleasant mood    Patient Positioning Upright  in chair    Oral care provided N/A    HPI Lisa Sutton is a 88 y.o. female with PMH significant for HTN, GERD, Hep B, HLD, Obesity, osteoporosis who initially presented to [facility] ED with aphasia.  CT angio was done which showed left M2 occlusion, she was brought to [facility], underwent mechanical thrombectomy. MRI was remarkable for acute infarcts in the L MCA territory and right parietal cortex.  Pt was intubated less than 24 hrs on 10/15.  Pt has a hx of esophageal dysphagia. She received SLP therapy at [facility] during acute admission, [facility] for 2 weeks SNF, and was discharged from home health SLP services last week. She was referred for outpatient SLP therapy by Etter Sjogren, PA-C.      Treatment Provided   Treatment provided Cognitive-Linquistic      Pain Assessment   Pain Assessment No/denies pain      Cognitive-Linquistic Treatment   Treatment focused on Aphasia;Patient/family/caregiver education;Apraxia    Skilled Treatment SLP provided skilled intervention strategies to stimulate accurate word productions during oral reading tasks. She benefits from cues to look away, use paper for visual tracking, and to continue reading silently and then go back to oral reading of text. Further assessment or reading comprehension via portions of the Western Aphasia Battery was utilized.     Assessment / Recommendations / Plan   Plan Continue with current plan of care      Progression Toward Goals   Progression toward goals Progressing toward goals              SLP Education - 06/25/21 0922     Education Details Provided additional reading comprehension homework    Person(s) Educated Patient    Methods Explanation;Handout    Comprehension Verbalized understanding              SLP Short Term Goals - 06/25/21 0926       SLP SHORT TERM GOAL #1   Title Pt will describe objects and pictures by providing at least three salient features as judged by clinician  with 80% acc when provided mi/mod cues.    Baseline 70%    Time 4    Period Weeks    Status On-going    Target Date 06/29/21      SLP SHORT TERM GOAL #2   Title Pt will implement word-finding strategies with 80% accuracy when unable to verbalize desired word in conversation/functional tasks with mod assist.    Baseline 65%    Time 4    Period  Weeks    Status On-going    Target Date 06/29/21      SLP SHORT TERM GOAL #3   Title Pt will complete reading comprehension tasks (short phrases) with 80% acc and min assist via multiple choice response.    Baseline max assist    Time 4    Period Weeks    Status On-going    Target Date 06/29/21      SLP SHORT TERM GOAL #4   Title Pt will respond to open-ended questions with 80% acc and min/mod cues with use of personalized communication template (personally relevant vocabulary).    Baseline Will create personalized communication template over the first three sessions to use going forward    Time 4    Period Weeks    Status On-going    Target Date 06/29/21      SLP SHORT TERM GOAL #5   Title Pt will repeat short functional phrases/sentences with 80% acc when provided mi/mod cues for error awareness.    Baseline 40%    Time 4    Period Weeks    Status On-going    Target Date 06/29/21                Plan - 06/25/21 0925     Clinical Impression Statement Pt continues to be alert and motivated for therapy. She completed ~6 pages of reading comprehension homework over night with 95% acc. In session, she completed portions of the reading comprehension section from the [place] with 50% acc. She was able to orally read single words with 100% acc and either provide a salient feature or use in a sentence with 100% acc. She completed oral single word sentence completion tasks with 85% acc with min cues. She answered open ended questions with 100% acc with min cues. Goals to be updated on her 9th visit.   Speech Therapy  Frequency 2x / week    Duration 8 weeks    Treatment/Interventions Compensatory strategies;Cueing hierarchy;Patient/family education;Functional tasks;Multimodal communcation approach;SLP instruction and feedback;Language facilitation    Potential to Achieve Goals Good    SLP Home Exercise Plan Pt will completed HEP as assigned to facilitate carryover of treatment strategies and techniques in home environment with use of written cues as needed.    Consulted and Agree with Plan of Care Patient;Family member/caregiver    Family Member Granton Son, [place]             Patient will benefit from skilled therapeutic intervention in order to improve the following deficits and impairments:   Aphasia  Cognitive communication deficit    Problem List Patient Active Problem List   Diagnosis Date Noted   Hypertensive emergency    Acute ischemic left MCA stroke ([place]) 03/07/2021   Cerebrovascular accident (CVA) ([place])    Encounter for intubation    Pruritus ani 08/05/2020   History of hepatitis B 02/05/2020   Chronic diarrhea 07/24/2019   Proteinuria 07/16/2019   GAD (generalized anxiety disorder) 03/02/2019   Controlled substance agreement signed 03/02/2019   Hyperlipidemia associated with type 2 diabetes mellitus ([place]) 02/19/2019   CKD stage 3 due to type 2 diabetes mellitus ([place]) 02/19/2019   Primary hypertension 02/19/2019   Elevated alkaline phosphatase level 01/15/2019   Stage 3 chronic kidney disease ([place]) 06/15/2017   History of colonic polyps 05/09/2017   Presbycusis of both ears 06/04/2016   Hypothyroidism due to acquired atrophy of thyroid 04/26/2016   Vitamin D deficiency 04/26/2016   Status  post total left knee replacement 09/15/2015   Statin intolerance 09/11/2015   Diabetic neuropathy, type II diabetes mellitus ([place]) 07/30/2015   Abnormal chest x-ray 11/27/2014   Primary insomnia 04/12/2013   GERD (gastroesophageal reflux disease) 04/25/2012   Fatty liver 08/09/2011    Esophageal dysphagia 08/09/2011   Type 2 diabetes mellitus with diabetic nephropathy, without long-term current use of insulin [facility]) 08/09/2011   Thank you,  Genene Churn, [place]  Genene Churn, [place] 06/25/2021, 10:17 AM  [place], [place], [address] Phone: [phone]   Fax:  [phone]   Name: Lisa Sutton MRN: [id number] Date of Birth: 1933-08-29

## 2021-07-02 IMAGING — RF DG ESOPHAGUS
10 series · 14 of 24 positions shown · non-contrast
Comparison: 08/28/2015

CLINICAL DATA: Dysphagia. History of reflux. Sensation of food
stasis in the esophagus.

EXAM:
ESOPHOGRAM / BARIUM SWALLOW / BARIUM TABLET STUDY
TECHNIQUE: Combined double contrast and single contrast examination performed
using effervescent crystals, thick barium liquid, and thin barium
liquid. The patient was observed with fluoroscopy swallowing a 13 mm
barium sulphate tablet.
FLUOROSCOPY TIME:  Fluoroscopy Time:  2 minutes, 12 seconds
Radiation Exposure Index (if provided by the fluoroscopic device):
21.2 mGy
Number of Acquired Spot Images: 0

[Series 1: cp_standard · 0.17mm/px · 2 of 47 frames shown (1 of 10)]
[frame 2/47]
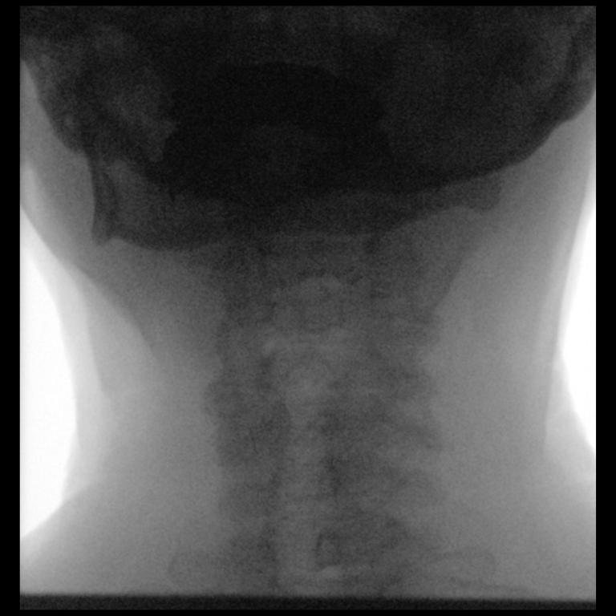
[frame 40/47]
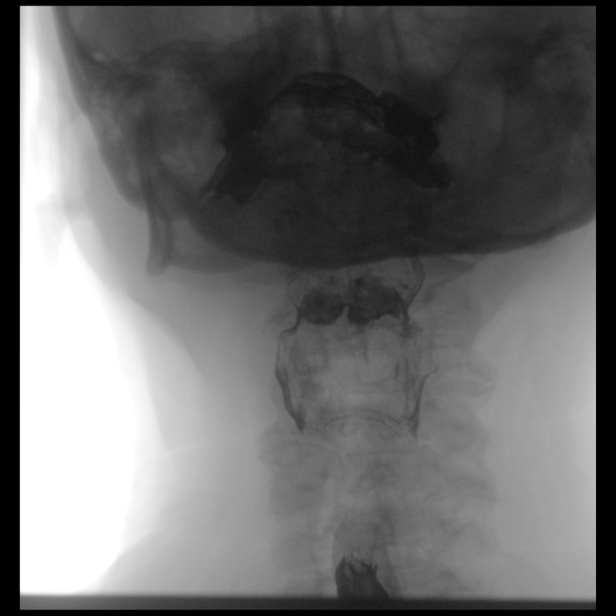

[Series 2: cp_standard · 0.17mm/px · 1 of 44 frames shown (2 of 10)]
[frame 38/44]
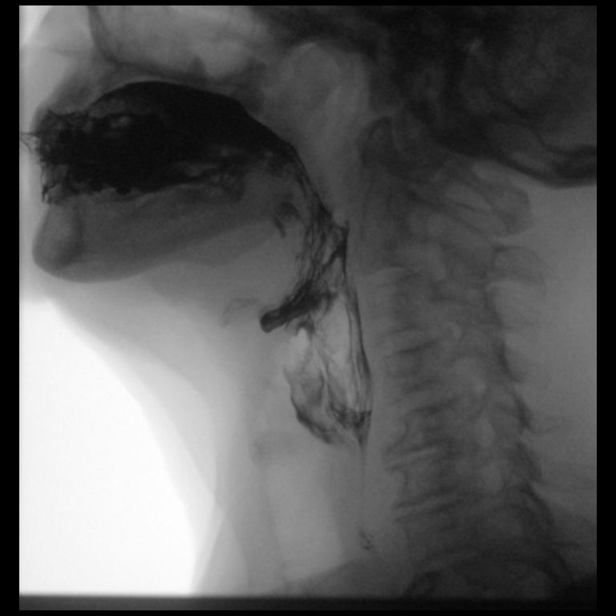

[Series 3: cp_standard · 0.17mm/px · 1 of 77 frames shown (3 of 10)]
[frame 39/77]
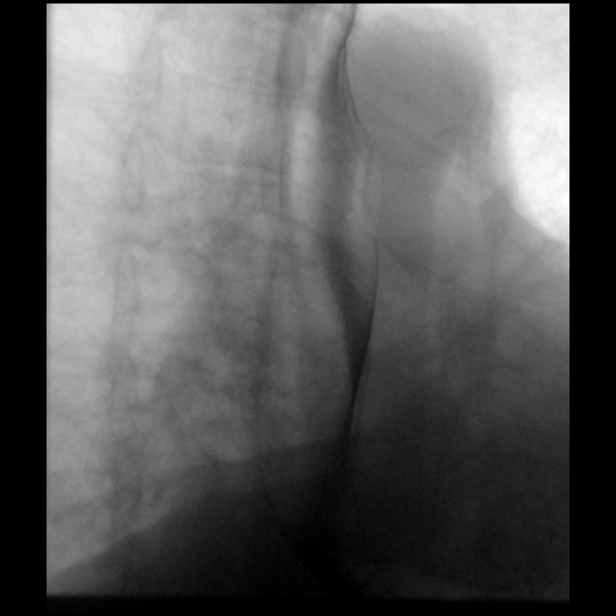

[Series 4: cp_standard · 0.17mm/px · 2 of 53 frames shown (4 of 10)]
[frame 8/53]
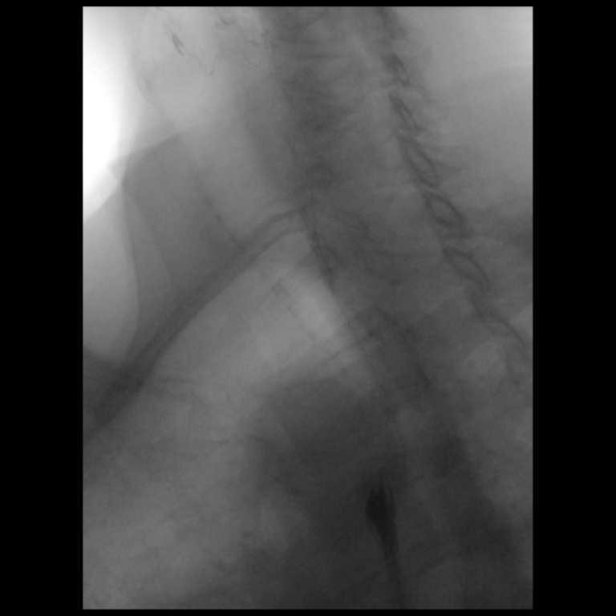
[frame 46/53]
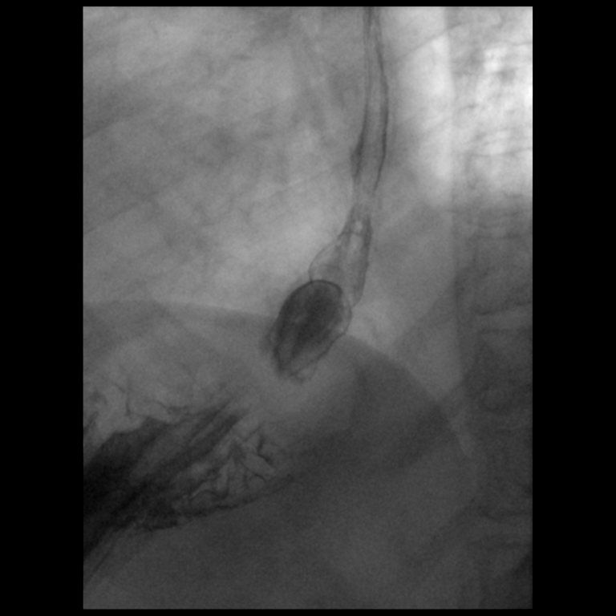

[Series 5: cp_standard · 0.18mm/px · 1 of 33 frames shown (5 of 10)]
[frame 30/33]
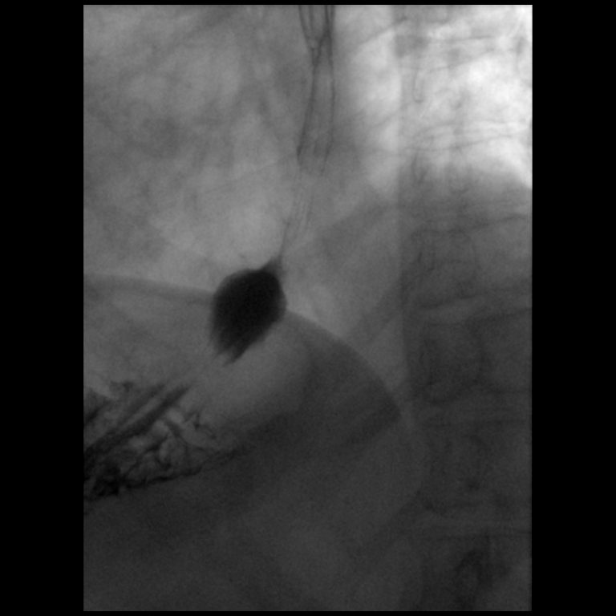

[Series 6: cp_standard · 0.18mm/px · 1 of 33 frames shown (6 of 10)]
[frame 5/33]
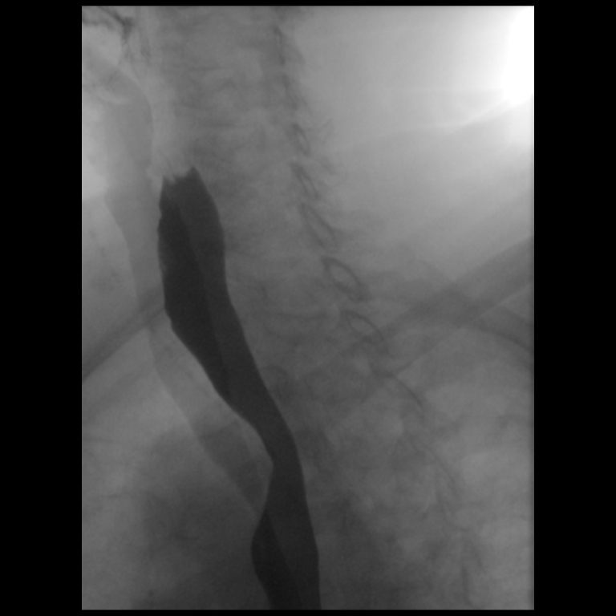

[Series 7: cp_standard · 0.18mm/px · 2 of 69 frames shown (7 of 10)]
[frame 11/69]
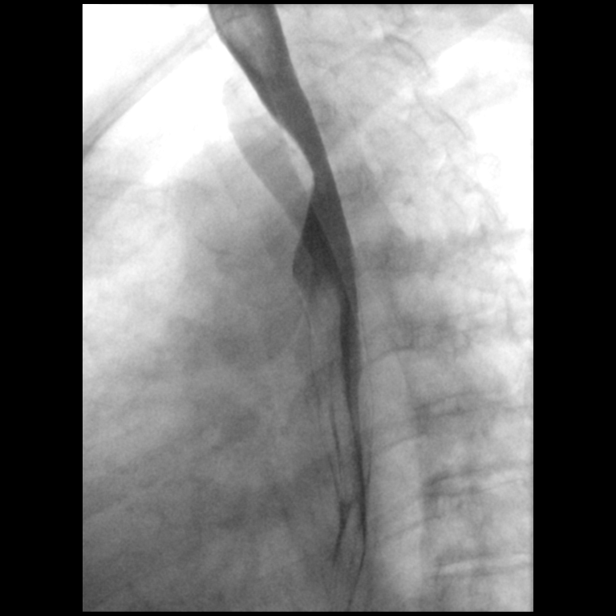
[frame 69/69]
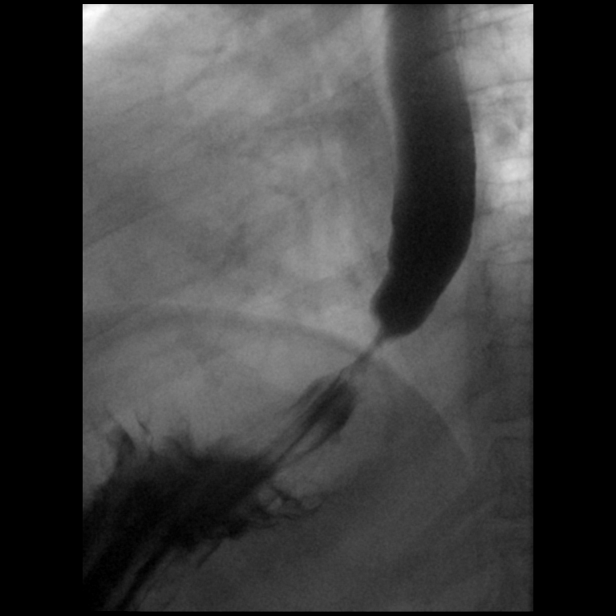

[Series 8: cp_standard · 0.18mm/px · 1 of 21 frames shown (8 of 10)]
[frame 18/21]
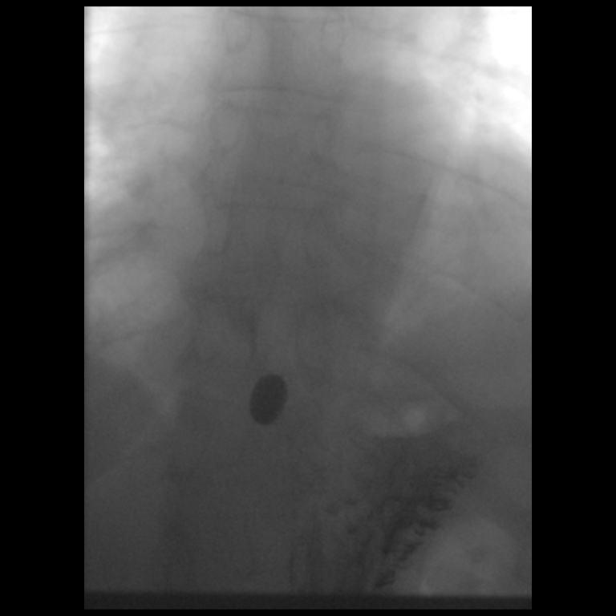

[Series 9: cp_standard · 0.18mm/px · 1 of 14 frames shown (9 of 10)]
[frame 3/14]
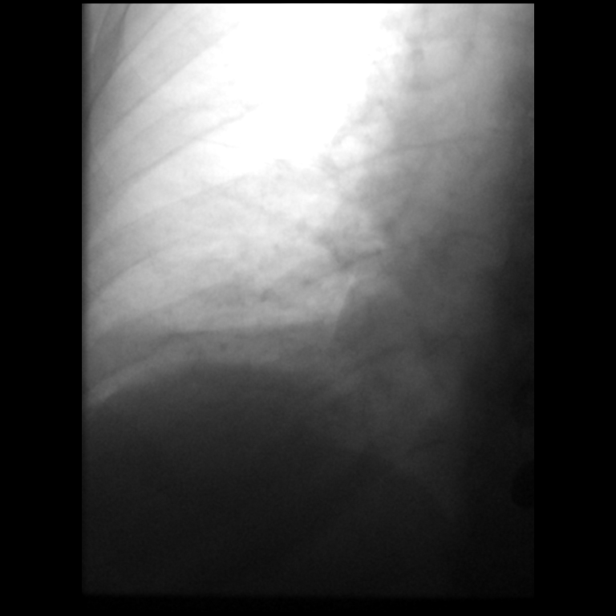

[Series 10: cp_standard · 0.17mm/px · 2 of 43 frames shown (10 of 10)]
[frame 1/43]
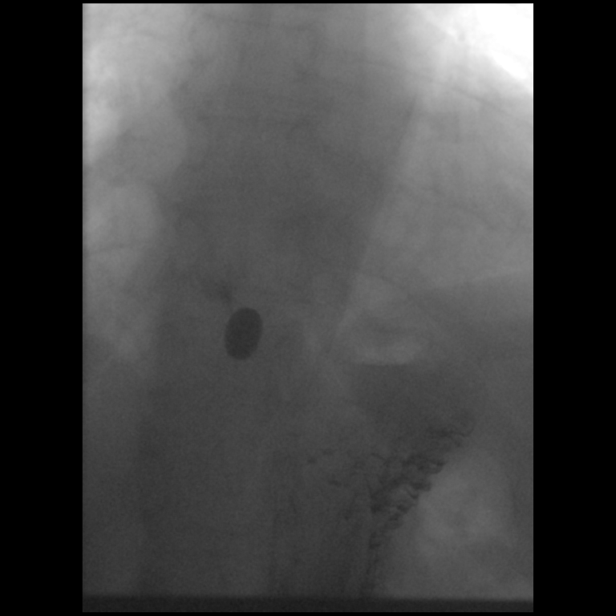
[frame 37/43]
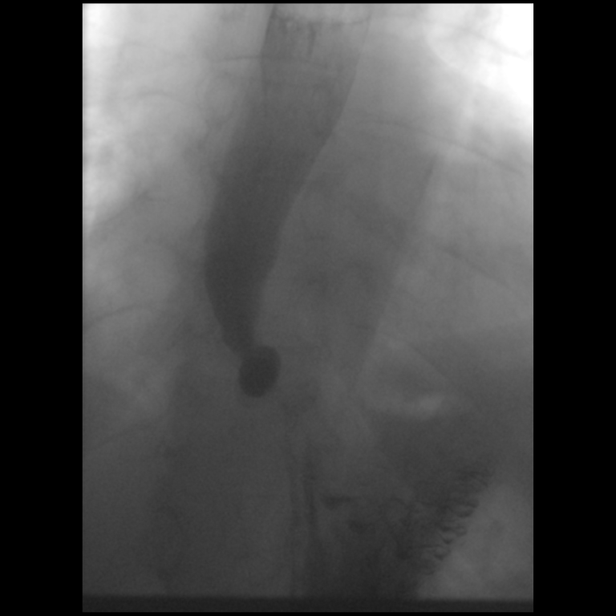

[14 of 24 positions shown; findings below may reference images not displayed]

FINDINGS: The pharyngeal phase of swallowing appears normal. Mild accentuation
of distal esophageal folds.

Primary peristaltic waves in the esophagus were disrupted on [DATE]
swallows in the mid esophagus, compatible with mild nonspecific
esophageal dysmotility disorder.

A distal esophageal mucosal ring is present, difficult to measure
due to obliquity, but this caused impaction of a 13 mm barium
tablet. I estimate the Schatzki ring diameter at about 12 mm.

Small intermittent type 1 hiatal hernia.

No esophageal ulcer or erosion identified.
IMPRESSION: 1. Distal esophageal mucosal ring, causing impaction of a 13 mm
barium tablet. Estimated diameter of the Schatzki ring is about 12
mm.
2. Small intermittent type 1 hiatal hernia.
3. Mild nonspecific esophageal dysmotility disorder.

## 2021-07-08 ENCOUNTER — Encounter (HOSPITAL_COMMUNITY): Payer: Self-pay | Admitting: Speech Pathology

## 2021-07-08 ENCOUNTER — Other Ambulatory Visit: Payer: Self-pay

## 2021-07-08 ENCOUNTER — Ambulatory Visit (HOSPITAL_COMMUNITY): Payer: Medicare Other | Admitting: Speech Pathology

## 2021-07-08 DIAGNOSIS — R4701 Aphasia: Secondary | ICD-10-CM | POA: Diagnosis not present

## 2021-07-08 DIAGNOSIS — R41841 Cognitive communication deficit: Secondary | ICD-10-CM

## 2021-07-08 NOTE — Therapy (Signed)
Lisa Sutton, [place], [address] Phone: [phone]   Fax:  [phone]  Speech Language Pathology Treatment  Patient Details  Name: Lisa Sutton MRN: [id number] Date of Birth: Aug 31, 1933 Referring Provider (SLP): Lisa Sutton, [facility]   Encounter Date: 07/08/2021   End of Session - 07/08/21 1101     Visit Number 8    Number of Visits 10    Date for SLP Re-Evaluation 07/09/21   before 10th visit   Authorization Type [employer] Medicare   [employer] Med 05/24/2021-05/23/2022  OOP 3,600  co-pay 20  no DED, no visit limit, no auth   SLP Start Time 0945    SLP Stop Time  1035    SLP Time Calculation (min) 50 min    Activity Tolerance Patient tolerated treatment well             Past Medical History:  Diagnosis Date   Anxiety    Arthritis    Cataract    Dysphagia    'sometimes but not a major issue' been checked out by GI (per pt)   Dysrhythmia    'heart used to skip but doesn't anymore' was checked out by Dr. Ron Sutton late '90s, everything checked out ok and not had any skipping since (all per pt)   Essential hypertension, benign    Fatty liver    GERD (gastroesophageal reflux disease)    H/O hypercalcemia 04/12/2017   Hepatitis B    had at age 35, 'GI doc said it's gone away'   Hepatitis B surface antigen positive    High cholesterol    History of hiatal hernia    Hypercalcemia    Hypothyroidism    Kidney stone August 2014   Patient was seen at [facility]   Obesity    Osteoporosis    PONV (postoperative nausea and vomiting)    Type 2 diabetes mellitus ([place])     Past Surgical History:  Procedure Laterality Date   ABDOMINAL HYSTERECTOMY     BILATERAL CARPAL TUNNEL RELEASE     Dr. Marlou Sutton at surgical center   BIOPSY  05/26/2017   Procedure: BIOPSY;  Surgeon: Lisa Houston, MD;  [facility];  Service: Endoscopy;;  colon   BIOPSY  10/11/2018   Procedure: BIOPSY;  Surgeon: Lisa Houston, MD;  [facility];  Service: Endoscopy;;   CATARACT EXTRACTION, BILATERAL     COLONOSCOPY N/A 05/26/2017   Procedure: COLONOSCOPY;  Surgeon: Lisa Houston, MD;  [facility];  Service: Endoscopy;  Laterality: N/A;  10:30   Deviated septum repair     ESOPHAGEAL DILATION N/A 08/16/2019   Procedure: ESOPHAGEAL DILATION;  Surgeon: Lisa Houston, MD;  [facility];  Service: Endoscopy;  Laterality: N/A;   ESOPHAGOGASTRODUODENOSCOPY N/A 07/28/2016   Procedure: ESOPHAGOGASTRODUODENOSCOPY (EGD);  Surgeon: Lisa Houston, MD;  [facility];  Service: Endoscopy;  Laterality: N/A;  300 - moved to 3/7 @ 1:00   ESOPHAGOGASTRODUODENOSCOPY N/A 08/16/2019   Procedure: ESOPHAGOGASTRODUODENOSCOPY (EGD);  Surgeon: Lisa Houston, MD;  [facility];  Service: Endoscopy;  Laterality: N/A;  [place] N/A 10/11/2018   Procedure: FLEXIBLE SIGMOIDOSCOPY;  Surgeon: Lisa Houston, MD;  [facility];  Service: Endoscopy;  Laterality: N/A;  unable to complete colonoscopy    IR CT HEAD LTD  03/07/2021   IR PERCUTANEOUS ART THROMBECTOMY/INFUSION INTRACRANIAL  INC DIAG ANGIO  03/07/2021   IR US GUIDE VASC ACCESS RIGHT  03/07/2021   KNEE ARTHROPLASTY Left 09/15/2015   Procedure: COMPUTER ASSISTED TOTAL KNEE ARTHROPLASTY;  Surgeon: Lisa Killings, MD;  Location: [place];  Service: Orthopedics;  Laterality: Left;   KNEE ARTHROSCOPY     left knee 2003   RADIOLOGY WITH ANESTHESIA N/A 03/07/2021   Procedure: IR WITH ANESTHESIA;  Surgeon: Lisa Mckusick, DO;  Location: [place];  Service: Anesthesiology;  Laterality: N/A;    There were no vitals filed for this visit.   Subjective Assessment - 07/08/21 1003     Subjective "Sometimes I do great and sometimes I can't talk."    Currently in Pain? No/denies              ADULT SLP TREATMENT - 07/08/21 1014       General Information   Behavior/Cognition Alert;Cooperative;Pleasant mood     Patient Positioning Upright in chair    Oral care provided N/A    HPI Lisa Sutton is a 88 y.o. female with PMH significant for HTN, GERD, Hep B, HLD, Obesity, osteoporosis who initially presented to [facility] ED with aphasia.  CT angio was done which showed left M2 occlusion, she was brought to [facility], underwent mechanical thrombectomy. MRI was remarkable for acute infarcts in the L MCA territory and right parietal cortex.  Pt was intubated less than 24 hrs on 10/15.  Pt has a hx of esophageal dysphagia. She received SLP therapy at [facility] during acute admission, [facility] for 2 weeks SNF, and was discharged from home health SLP services last week. She was referred for outpatient SLP therapy by Lisa Sjogren, PA-C.      Treatment Provided   Treatment provided Cognitive-Linquistic      Pain Assessment   Pain Assessment No/denies pain      Cognitive-Linquistic Treatment   Treatment focused on Aphasia;Patient/family/caregiver education;Apraxia    Skilled Treatment SLP provided skilled intervention strategies to stimulate accurate word productions during oral reading tasks. She benefits from cues to look away, use paper for visual tracking, and to continue reading silently and then go back to oral reading of text. Further assessment or reading comprehension via portions of the Western Aphasia Battery was utilized.      Assessment / Recommendations / Plan   Plan Continue with current plan of care      Progression Toward Goals   Progression toward goals Progressing toward goals                SLP Short Term Goals - 07/08/21 1121       SLP SHORT TERM GOAL #1   Title Pt will describe objects and pictures by providing at least three salient features as judged by clinician with 80% acc when provided mi/mod cues.    Baseline 70%    Time 4    Period Weeks    Status On-going    Target Date 06/29/21      SLP SHORT TERM GOAL #2   Title Pt will implement word-finding  strategies with 80% accuracy when unable to verbalize desired word in conversation/functional tasks with mod assist.    Baseline 65%    Time 4    Period Weeks    Status On-going    Target Date 06/29/21      SLP SHORT TERM GOAL #3   Title Pt will complete reading comprehension tasks (short phrases) with 80% acc and min assist via  multiple choice response.    Baseline max assist    Time 4    Period Weeks    Status On-going    Target Date 06/29/21      SLP SHORT TERM GOAL #4   Title Pt will respond to open-ended questions with 80% acc and min/mod cues with use of personalized communication template (personally relevant vocabulary).    Baseline Will create personalized communication template over the first three sessions to use going forward    Time 4    Period Weeks    Status On-going    Target Date 06/29/21      SLP SHORT TERM GOAL #5   Title Pt will repeat short functional phrases/sentences with 80% acc when provided mi/mod cues for error awareness.    Baseline 40%    Time 4    Period Weeks    Status On-going    Target Date 06/29/21                Plan - 07/08/21 1102     Clinical Impression Statement Pt completed a large portion of her reading comprehension/category identification for homework. She has little difficulty with the language aspect, but continues to have difficulty with visual tracking/processing. She responded to open ended questions with 90% effectiveness with allowance for paraphasic errors. She continues to need cues to slow down, take a break, use written supports, and/or look away. SLP assisted Pt in creating a list of topics she would like to discuss with her PCP later this afternoon. Pt was encouraged to resume previous activities like going to the putting green and [employer] with her friends. Will reassess goals tomorrow.    Speech Therapy Frequency 2x / week    Duration 4 weeks    Treatment/Interventions Compensatory strategies;Cueing  hierarchy;Patient/family education;Functional tasks;Multimodal communcation approach;SLP instruction and feedback;Language facilitation    Potential to Achieve Goals Good    SLP Home Exercise Plan Pt will completed HEP as assigned to facilitate carryover of treatment strategies and techniques in home environment with use of written cues as needed.    Consulted and Agree with Plan of Care Patient;Family member/caregiver    Family Member [place] Son, [place]             Patient will benefit from skilled therapeutic intervention in order to improve the following deficits and impairments:   Aphasia  Cognitive communication deficit    Problem List Patient Active Problem List   Diagnosis Date Noted   Hypertensive emergency    Acute ischemic left MCA stroke ([place]) 03/07/2021   Cerebrovascular accident (CVA) ([place])    Encounter for intubation    Pruritus ani 08/05/2020   History of hepatitis B 02/05/2020   Chronic diarrhea 07/24/2019   Proteinuria 07/16/2019   GAD (generalized anxiety disorder) 03/02/2019   Controlled substance agreement signed 03/02/2019   Hyperlipidemia associated with type 2 diabetes mellitus ([place]) 02/19/2019   CKD stage 3 due to type 2 diabetes mellitus ([place]) 02/19/2019   Primary hypertension 02/19/2019   Elevated alkaline phosphatase level 01/15/2019   Stage 3 chronic kidney disease ([place]) 06/15/2017   History of colonic polyps 05/09/2017   Presbycusis of both ears 06/04/2016   Hypothyroidism due to acquired atrophy of thyroid 04/26/2016   Vitamin D deficiency 04/26/2016   Status post total left knee replacement 09/15/2015   Statin intolerance 09/11/2015   Diabetic neuropathy, type II diabetes mellitus ([place]) 07/30/2015   Abnormal chest x-ray 11/27/2014   Primary insomnia 04/12/2013   GERD (  gastroesophageal reflux disease) 04/25/2012   Fatty liver 08/09/2011   Esophageal dysphagia 08/09/2011   Type 2 diabetes mellitus with diabetic nephropathy, without  long-term current use of insulin [facility]) 08/09/2011   Thank you,  Genene Churn, [place]  Genene Churn, [place] 07/08/2021, [place], [place], [address] Phone: [phone]   Fax:  [phone]   Name: Lisa Sutton MRN: [id number] Date of Birth: 1933-12-21

## 2021-07-09 ENCOUNTER — Encounter (HOSPITAL_COMMUNITY): Payer: Self-pay | Admitting: Speech Pathology

## 2021-07-09 ENCOUNTER — Ambulatory Visit (HOSPITAL_COMMUNITY): Payer: Medicare Other | Admitting: Speech Pathology

## 2021-07-09 DIAGNOSIS — R41841 Cognitive communication deficit: Secondary | ICD-10-CM

## 2021-07-09 DIAGNOSIS — R4701 Aphasia: Secondary | ICD-10-CM | POA: Diagnosis not present

## 2021-07-09 NOTE — Therapy (Signed)
[place], [place], [address] Phone: [phone]   Fax:  [phone]  Speech Language Pathology Treatment  Patient Details  Name: Lisa Sutton MRN: [id number] Date of Birth: [birth year] Referring Provider (SLP): Etter Sjogren, [facility]   Encounter Date: 07/09/2021   End of Session - 07/09/21 1227     Visit Number 9    Number of Visits 10    Date for SLP Re-Evaluation 08/06/21   before 10th visit   Authorization Type [facility] Medicare   [employer] Med 05/24/2021-05/23/2022  OOP 3,600  co-pay 20  no DED, no visit limit, no auth   SLP Start Time 0950    SLP Stop Time  1033    SLP Time Calculation (min) 43 min    Activity Tolerance Patient tolerated treatment well             Past Medical History:  Diagnosis Date   Anxiety    Arthritis    Cataract    Dysphagia    'sometimes but not a major issue' been checked out by GI (per pt)   Dysrhythmia    'heart used to skip but doesn't anymore' was checked out by Dr. Ron Parker late '90s, everything checked out ok and not had any skipping since (all per pt)   Essential hypertension, benign    Fatty liver    GERD (gastroesophageal reflux disease)    H/O hypercalcemia 04/12/2017   Hepatitis B    had at age 52, 'GI doc said it's gone away'   Hepatitis B surface antigen positive    High cholesterol    History of hiatal hernia    Hypercalcemia    Hypothyroidism    Kidney stone August 2014   Patient was seen at [facility]   Obesity    Osteoporosis    PONV (postoperative nausea and vomiting)    Type 2 diabetes mellitus ([place])     Past Surgical History:  Procedure Laterality Date   ABDOMINAL HYSTERECTOMY     BILATERAL CARPAL TUNNEL RELEASE     Dr. Marlou Sa at surgical center   BIOPSY  05/26/2017   Procedure: BIOPSY;  Surgeon: Rogene Houston, MD;  [facility];  Service: Endoscopy;;  colon   BIOPSY  10/11/2018   Procedure: BIOPSY;  Surgeon: Rogene Houston, MD;  [facility];  Service: Endoscopy;;   CATARACT EXTRACTION, BILATERAL     COLONOSCOPY N/A 05/26/2017   Procedure: COLONOSCOPY;  Surgeon: Rogene Houston, MD;  [facility];  Service: Endoscopy;  Laterality: N/A;  10:30   Deviated septum repair     ESOPHAGEAL DILATION N/A 08/16/2019   Procedure: ESOPHAGEAL DILATION;  Surgeon: Rogene Houston, MD;  [facility];  Service: Endoscopy;  Laterality: N/A;   ESOPHAGOGASTRODUODENOSCOPY N/A 07/28/2016   Procedure: ESOPHAGOGASTRODUODENOSCOPY (EGD);  Surgeon: Rogene Houston, MD;  [facility];  Service: Endoscopy;  Laterality: N/A;  300 - moved to 3/7 @ 1:00   ESOPHAGOGASTRODUODENOSCOPY N/A 08/16/2019   Procedure: ESOPHAGOGASTRODUODENOSCOPY (EGD);  Surgeon: Rogene Houston, MD;  [facility];  Service: Endoscopy;  Laterality: N/A;  [place] N/A 10/11/2018   Procedure: FLEXIBLE SIGMOIDOSCOPY;  Surgeon: Rogene Houston, MD;  [facility];  Service: Endoscopy;  Laterality: N/A;  unable to complete colonoscopy    IR CT HEAD LTD  03/07/2021   IR PERCUTANEOUS ART THROMBECTOMY/INFUSION INTRACRANIAL  INC DIAG ANGIO  03/07/2021   IR US GUIDE VASC ACCESS RIGHT  03/07/2021   KNEE ARTHROPLASTY Left 09/15/2015   Procedure: COMPUTER ASSISTED TOTAL KNEE ARTHROPLASTY;  Surgeon: Marybelle Killings, MD;  Location: [place];  Service: Orthopedics;  Laterality: Left;   KNEE ARTHROSCOPY     left knee 2003   RADIOLOGY WITH ANESTHESIA N/A 03/07/2021   Procedure: IR WITH ANESTHESIA;  Surgeon: Corrie Mckusick, DO;  Location: [place];  Service: Anesthesiology;  Laterality: N/A;    There were no vitals filed for this visit.   Subjective Assessment - 07/09/21 0959     Subjective "Well, maybe I will come once a week."    Patient is accompained by: Family member    Special Tests [place] Aphasia Screening Tool (MAST)    Currently in Pain? No/denies              ADULT SLP TREATMENT - 07/09/21  1222       General Information   Behavior/Cognition Alert;Cooperative;Pleasant mood    Patient Positioning Upright in chair    Oral care provided N/A    HPI Lisa Sutton is a 89 y.o. female with PMH significant for HTN, GERD, Hep B, HLD, Obesity, osteoporosis who initially presented to [facility] ED with aphasia.  CT angio was done which showed left M2 occlusion, she was brought to [facility], underwent mechanical thrombectomy. MRI was remarkable for acute infarcts in the L MCA territory and right parietal cortex.  Pt was intubated less than 24 hrs on 10/15.  Pt has a hx of esophageal dysphagia. She received SLP therapy at [facility] during acute admission, [facility] for 2 weeks SNF, and was discharged from home health SLP services last week. She was referred for outpatient SLP therapy by Etter Sjogren, PA-C.      Treatment Provided   Treatment provided Cognitive-Linquistic      Pain Assessment   Pain Assessment No/denies pain      Cognitive-Linquistic Treatment   Treatment focused on Aphasia;Patient/family/caregiver education;Apraxia    Skilled Treatment SLP readministered the MAST today with results below. SLP provided cues throughout the session for breaking perseverative responses during written task and cues to look away and return to oral reading tasks when she is "stuck".      Assessment / Recommendations / Plan   Plan Continue with current plan of care      Progression Toward Goals   Progression toward goals Goals met and updated    Patient/Family Stated Goal Improve speech    Time for goal achievement 08/06/21    Goal formation with patient/family    Potential to Achieve Goals Good    Potential Considerations Severity of impairments              SLP Education - 07/09/21 1226     Education Details Pt/family would like to decrease sessions to once per week for 4 more weeks    Person(s) Educated Patient;Child(ren)    Methods Explanation    Comprehension  Verbalized understanding              SLP Short Term Goals - 07/09/21 1231       SLP SHORT TERM GOAL #1   Title Pt will describe objects and pictures by providing at least three salient features as judged by clinician with 80% acc when provided mi/mod cues.    Baseline 70%    Time 4    Period Weeks    Status Achieved  Target Date 06/29/21      SLP SHORT TERM GOAL #2   Title Pt will implement word-finding strategies with 80% accuracy when unable to verbalize desired word in conversation/functional tasks with mod assist.   change to 85% with min assist, 07/09/21   Baseline 65%, new baseline 07/09/21 is 80% acc with mi/mod assist    Time 4    Period Weeks    Status Achieved    Target Date 06/29/21      SLP SHORT TERM GOAL #3   Title Pt will complete reading comprehension tasks (short phrases) with 80% acc and min assist via multiple choice response.    Baseline max assist    Time 4    Period Weeks    Status Achieved    Target Date 06/29/21      SLP SHORT TERM GOAL #4   Title Pt will respond to open-ended questions with 80% acc and min/mod cues with use of personalized communication template (personally relevant vocabulary).   Change to 90% acc with indirect cues   Baseline 07/09/21 90% acc min cues    Time 4    Period Weeks    Status Achieved    Target Date 06/29/21      SLP SHORT TERM GOAL #5   Title Pt will repeat short functional phrases/sentences with 80% acc when provided mi/mod cues for error awareness.    Baseline 40%    Time 4    Period Weeks    Status Achieved    Target Date 06/29/21      Additional Short Term Goals   Additional Short Term Goals Yes      SLP SHORT TERM GOAL #6   Title Pt will provide verbal sequence and/or moderately complex verbal explanations for "how to" questions with 90% effectiveness and min cues as needed.    Baseline 90% acc with mod cues    Time 4    Period Weeks    Status New    Target Date 08/06/21            MAST Pt  was administered the [place] Aphasia Screening Tool (MAST) and achieved an overall expressive score of 35/50, receptive score of 38/50, and a total score of 73/100.  Expressive Index Naming 8/10 Automatic Speech 9/10 Repetition 8/10 Writing 0/10 Verbal Fluency 10/10 Expressive Subscale 35/50  Receptive Index Yes/No Accuracy 18/20 Object Recognition 10/10 Following Instructions 6/10 Reading Instructions 6/10 Receptive Subscale 38/50  Total Index Expressive 35/50 Receptive 38/50 Total Score 73/100     Plan - 07/09/21 1227     Clinical Impression Statement MAST Aphasia screen repeated this date with improvement from 24/50 to 35/50 in expressive skills (minus 10 for written expression) and 34/50 to 38/50 in receptive skills for a total score improving from 58/100 to 73/100. Pt was pleased when SLP shared her improvement. She is unable to complete the written portion beyond the first word, as she then perseverates on each additional response. Goals have been updated and plan is for 4 more therapy sessions over 4 weeks to address the goals. Pt and caregiver are in agreement with plan of care. Pt was encouraged to resume some of her previous activities with her friends Paramedic and can adapt by using chair).    Speech Therapy Frequency 1x /week    Duration 4 weeks    Treatment/Interventions Compensatory strategies;Cueing hierarchy;Patient/family education;Functional tasks;Multimodal communcation approach;SLP instruction and feedback;Language facilitation    Potential to Achieve Goals Good  SLP Home Exercise Plan Pt will completed HEP as assigned to facilitate carryover of treatment strategies and techniques in home environment with use of written cues as needed.    Consulted and Agree with Plan of Care Patient;Family member/caregiver    Family Member [place] Son, [place]             Patient will benefit from skilled therapeutic intervention in order to improve the  following deficits and impairments:   Aphasia  Cognitive communication deficit    Problem List Patient Active Problem List   Diagnosis Date Noted   Hypertensive emergency    Acute ischemic left MCA stroke ([place]) 03/07/2021   Cerebrovascular accident (CVA) ([place])    Encounter for intubation    Pruritus ani 08/05/2020   History of hepatitis B 02/05/2020   Chronic diarrhea 07/24/2019   Proteinuria 07/16/2019   GAD (generalized anxiety disorder) 03/02/2019   Controlled substance agreement signed 03/02/2019   Hyperlipidemia associated with type 2 diabetes mellitus ([place]) 02/19/2019   CKD stage 3 due to type 2 diabetes mellitus ([place]) 02/19/2019   Primary hypertension 02/19/2019   Elevated alkaline phosphatase level 01/15/2019   Stage 3 chronic kidney disease ([place]) 06/15/2017   History of colonic polyps 05/09/2017   Presbycusis of both ears 06/04/2016   Hypothyroidism due to acquired atrophy of thyroid 04/26/2016   Vitamin D deficiency 04/26/2016   Status post total left knee replacement 09/15/2015   Statin intolerance 09/11/2015   Diabetic neuropathy, type II diabetes mellitus ([place]) 07/30/2015   Abnormal chest x-ray 11/27/2014   Primary insomnia 04/12/2013   GERD (gastroesophageal reflux disease) 04/25/2012   Fatty liver 08/09/2011   Esophageal dysphagia 08/09/2011   Type 2 diabetes mellitus with diabetic nephropathy, without long-term current use of insulin ([place]) 08/09/2011   Speech Therapy Progress Note  Dates of Reporting Period: 05/26/21 to 07/09/21  Objective Reports of Subjective Statement: See above  Objective Measurements: See above  Goal Update: See above  Plan: Continue for 1x/week for 4 more weeks to address the goals updated above  Reason Skilled Services are Required: Pt continues to make excellent progress in her expressive language skills and needs to continue targeting updated goals to increase independence with communication skills.  Thank you,  Genene Churn, [place]   Teddy Spike 07/09/2021, 12:38 PM  [facility] [address] [place], [place], [address] Phone: [phone]   Fax:  [phone]   Name: Lisa Sutton MRN: [id number] Date of Birth: 1933-11-19

## 2021-07-15 ENCOUNTER — Encounter (HOSPITAL_COMMUNITY): Payer: Self-pay | Admitting: Speech Pathology

## 2021-07-15 ENCOUNTER — Other Ambulatory Visit: Payer: Self-pay

## 2021-07-15 ENCOUNTER — Ambulatory Visit (HOSPITAL_COMMUNITY): Payer: Medicare Other | Admitting: Speech Pathology

## 2021-07-15 ENCOUNTER — Encounter (HOSPITAL_COMMUNITY): Payer: Medicare Other | Admitting: Speech Pathology

## 2021-07-15 DIAGNOSIS — R4701 Aphasia: Secondary | ICD-10-CM | POA: Diagnosis not present

## 2021-07-15 DIAGNOSIS — R41841 Cognitive communication deficit: Secondary | ICD-10-CM

## 2021-07-15 NOTE — Therapy (Signed)
[place], [place], [address] Phone: [phone]   Fax:  [phone]  Speech Language Pathology Treatment  Patient Details  Name: Lisa Sutton MRN: [id number] Date of Birth: Jan 05, 1934 Referring Provider (SLP): Etter Sjogren, [facility]   Encounter Date: 07/15/2021   End of Session - 07/15/21 0956     Visit Number 10    Number of Visits 13    Date for SLP Re-Evaluation 08/06/21   before 10th visit   Authorization Type [employer]HC Medicare   [employer] Med 05/24/2021-05/23/2022  OOP 3,600  co-pay 20  no DED, no visit limit, no auth   SLP Start Time 0945    SLP Stop Time  1030    SLP Time Calculation (min) 45 min    Activity Tolerance Patient tolerated treatment well             Past Medical History:  Diagnosis Date   Anxiety    Arthritis    Cataract    Dysphagia    'sometimes but not a major issue' been checked out by GI (per pt)   Dysrhythmia    'heart used to skip but doesn't anymore' was checked out by Dr. Ron Parker late '90s, everything checked out ok and not had any skipping since (all per pt)   Essential hypertension, benign    Fatty liver    GERD (gastroesophageal reflux disease)    H/O hypercalcemia 04/12/2017   Hepatitis B    had at age 48, 'GI doc said it's gone away'   Hepatitis B surface antigen positive    High cholesterol    History of hiatal hernia    Hypercalcemia    Hypothyroidism    Kidney stone August 2014   Patient was seen at [facility]   Obesity    Osteoporosis    PONV (postoperative nausea and vomiting)    Type 2 diabetes mellitus ([place])     Past Surgical History:  Procedure Laterality Date   ABDOMINAL HYSTERECTOMY     BILATERAL CARPAL TUNNEL RELEASE     Dr. Marlou Sa at surgical center   BIOPSY  05/26/2017   Procedure: BIOPSY;  Surgeon: Rogene Houston, MD;  [facility];  Service: Endoscopy;;  colon   BIOPSY  10/11/2018   Procedure: BIOPSY;  Surgeon: Rogene Houston, MD;  [facility];  Service: Endoscopy;;   CATARACT EXTRACTION, BILATERAL     COLONOSCOPY N/A 05/26/2017   Procedure: COLONOSCOPY;  Surgeon: Rogene Houston, MD;  [facility];  Service: Endoscopy;  Laterality: N/A;  10:30   Deviated septum repair     ESOPHAGEAL DILATION N/A 08/16/2019   Procedure: ESOPHAGEAL DILATION;  Surgeon: Rogene Houston, MD;  [facility];  Service: Endoscopy;  Laterality: N/A;   ESOPHAGOGASTRODUODENOSCOPY N/A 07/28/2016   Procedure: ESOPHAGOGASTRODUODENOSCOPY (EGD);  Surgeon: Rogene Houston, MD;  [facility];  Service: Endoscopy;  Laterality: N/A;  300 - moved to 3/7 @ 1:00   ESOPHAGOGASTRODUODENOSCOPY N/A 08/16/2019   Procedure: ESOPHAGOGASTRODUODENOSCOPY (EGD);  Surgeon: Rogene Houston, MD;  [facility];  Service: Endoscopy;  Laterality: N/A;  [place] N/A 10/11/2018   Procedure: FLEXIBLE SIGMOIDOSCOPY;  Surgeon: Rogene Houston, MD;  [facility];  Service: Endoscopy;  Laterality: N/A;  unable to complete colonoscopy    IR CT HEAD LTD  03/07/2021   IR PERCUTANEOUS ART THROMBECTOMY/INFUSION INTRACRANIAL  INC DIAG ANGIO  03/07/2021   IR US GUIDE VASC ACCESS RIGHT  03/07/2021   KNEE ARTHROPLASTY Left 09/15/2015   Procedure: COMPUTER ASSISTED TOTAL KNEE ARTHROPLASTY;  Surgeon: Marybelle Killings, MD;  Location: [place];  Service: Orthopedics;  Laterality: Left;   KNEE ARTHROSCOPY     left knee 2003   RADIOLOGY WITH ANESTHESIA N/A 03/07/2021   Procedure: IR WITH ANESTHESIA;  Surgeon: Corrie Mckusick, DO;  Location: [place];  Service: Anesthesiology;  Laterality: N/A;    There were no vitals filed for this visit.   Subjective Assessment - 07/15/21 0952     Subjective "I tried to do a littel bit."    Currently in Pain? No/denies              ADULT SLP TREATMENT - 07/15/21 0954       General Information   Behavior/Cognition Alert;Cooperative;Pleasant mood    Patient Positioning  Upright in chair    Oral care provided N/A    HPI Lisa Sutton is a 88 y.o. female with PMH significant for HTN, GERD, Hep B, HLD, Obesity, osteoporosis who initially presented to [facility] ED with aphasia.  CT angio was done which showed left M2 occlusion, she was brought to [facility], underwent mechanical thrombectomy. MRI was remarkable for acute infarcts in the L MCA territory and right parietal cortex.  Pt was intubated less than 24 hrs on 10/15.  Pt has a hx of esophageal dysphagia. She received SLP therapy at [facility] during acute admission, [facility] for 2 weeks SNF, and was discharged from home health SLP services last week. She was referred for outpatient SLP therapy by Etter Sjogren, PA-C.      Treatment Provided   Treatment provided Cognitive-Linquistic      Pain Assessment   Pain Assessment No/denies pain      Cognitive-Linquistic Treatment   Treatment focused on Aphasia;Patient/family/caregiver education;Apraxia    Skilled Treatment SLP provided skilled      Assessment / Recommendations / Plan   Plan Continue with current plan of care      Progression Toward Goals   Progression toward goals Progressing toward goals                SLP Short Term Goals - 07/15/21 0957       SLP SHORT TERM GOAL #1   Title Pt will describe objects and pictures by providing at least three salient features as judged by clinician with 80% acc when provided mi/mod cues.    Baseline 70%    Time 4    Period Weeks    Status Achieved    Target Date 06/29/21      SLP SHORT TERM GOAL #2   Title Pt will implement word-finding strategies with 85% accuracy when unable to verbalize desired word in conversation/functional tasks with min assist.   change to 85% with min assist, 07/09/21   Baseline 65%, new baseline 07/09/21 is 80% acc with mi/mod assist    Time 4    Period Weeks    Status On-going    Target Date 06/29/21      SLP SHORT TERM GOAL #3   Title Pt will complete  reading comprehension tasks (short phrases) with 80% acc and min assist via multiple choice response.    Baseline max assist    Time 4    Period Weeks    Status Achieved    Target Date 06/29/21      SLP  SHORT TERM GOAL #4   Title Pt will respond to open-ended questions with 90% acc and indirect cues with use of personalized communication template (personally relevant vocabulary).   Change to 90% acc with indirect cues   Baseline 07/09/21 90% acc min cues    Time 4    Period Weeks    Status On-going    Target Date 06/29/21      SLP SHORT TERM GOAL #5   Title Pt will repeat short functional phrases/sentences with 80% acc when provided mi/mod cues for error awareness.    Baseline 40%    Time 4    Period Weeks    Status Achieved    Target Date 06/29/21      SLP SHORT TERM GOAL #6   Title Pt will provide verbal sequence and/or moderately complex verbal explanations for "how to" questions with 90% effectiveness and min cues as needed.    Baseline 90% acc with mod cues    Time 4    Period Weeks    Status On-going    Target Date 08/06/21                Plan - 07/15/21 0957     Clinical Impression Statement Pt alert and cooperative for therapy this date. She completed homework, but reported difficulty with vision. In session, Pt completed convergent naming task with 100% acc with min assist and named concrete categories with 100% acc with mi/mod cues. She continues to have paraphasic errors, however responses are generally intelligible and related to intended word so that listener can follow along. She was able to create sentences with a given verb with 100% acc. Plan to address expressive language goals 1x/week for the next 3 weeks.   Speech Therapy Frequency 1x /week    Duration Other (comment)   3 more weeks   Treatment/Interventions Compensatory strategies;Cueing hierarchy;Patient/family education;Functional tasks;Multimodal communcation approach;SLP instruction and  feedback;Language facilitation    Potential to Achieve Goals Good    SLP Home Exercise Plan Pt will completed HEP as assigned to facilitate carryover of treatment strategies and techniques in home environment with use of written cues as needed.    Consulted and Agree with Plan of Care Patient;Family member/caregiver    Family Member [place] Son, [place]             Patient will benefit from skilled therapeutic intervention in order to improve the following deficits and impairments:   Aphasia  Cognitive communication deficit    Problem List Patient Active Problem List   Diagnosis Date Noted   Hypertensive emergency    Acute ischemic left MCA stroke ([place]) 03/07/2021   Cerebrovascular accident (CVA) ([place])    Encounter for intubation    Pruritus ani 08/05/2020   History of hepatitis B 02/05/2020   Chronic diarrhea 07/24/2019   Proteinuria 07/16/2019   GAD (generalized anxiety disorder) 03/02/2019   Controlled substance agreement signed 03/02/2019   Hyperlipidemia associated with type 2 diabetes mellitus ([place]) 02/19/2019   CKD stage 3 due to type 2 diabetes mellitus ([place]) 02/19/2019   Primary hypertension 02/19/2019   Elevated alkaline phosphatase level 01/15/2019   Stage 3 chronic kidney disease ([place]) 06/15/2017   History of colonic polyps 05/09/2017   Presbycusis of both ears 06/04/2016   Hypothyroidism due to acquired atrophy of thyroid 04/26/2016   Vitamin D deficiency 04/26/2016   Status post total left knee replacement 09/15/2015   Statin intolerance 09/11/2015   Diabetic neuropathy, type II diabetes mellitus ([place]) 07/30/2015  Abnormal chest x-ray 11/27/2014   Primary insomnia 04/12/2013   GERD (gastroesophageal reflux disease) 04/25/2012   Fatty liver 08/09/2011   Esophageal dysphagia 08/09/2011   Type 2 diabetes mellitus with diabetic nephropathy, without long-term current use of insulin ([place]) 08/09/2011   Thank you,  Genene Churn,  [place]  Genene Churn, [place] 07/15/2021, 10:01 AM  [place], [place], [address] Phone: [phone]   Fax:  [phone]   Name: Lisa Sutton MRN: [id number] Date of Birth: [birth year]

## 2021-07-16 ENCOUNTER — Other Ambulatory Visit: Payer: Self-pay

## 2021-07-16 ENCOUNTER — Encounter (HOSPITAL_COMMUNITY): Payer: Medicare Other | Admitting: Speech Pathology

## 2021-07-16 DIAGNOSIS — E119 Type 2 diabetes mellitus without complications: Secondary | ICD-10-CM | POA: Insufficient documentation

## 2021-07-16 DIAGNOSIS — E039 Hypothyroidism, unspecified: Secondary | ICD-10-CM | POA: Insufficient documentation

## 2021-07-16 DIAGNOSIS — I10 Essential (primary) hypertension: Secondary | ICD-10-CM | POA: Insufficient documentation

## 2021-07-16 DIAGNOSIS — R21 Rash and other nonspecific skin eruption: Secondary | ICD-10-CM | POA: Diagnosis present

## 2021-07-16 DIAGNOSIS — T7840XA Allergy, unspecified, initial encounter: Secondary | ICD-10-CM | POA: Insufficient documentation

## 2021-07-16 NOTE — ED Triage Notes (Signed)
Pt started on Farxiga yesterday and started itching and developed a rash. Pt last took Benadryl 50mg  at 1800 today. Denies oral or throat symptoms.

## 2021-07-17 ENCOUNTER — Emergency Department (HOSPITAL_COMMUNITY)
Admission: EM | Admit: 2021-07-17 | Discharge: 2021-07-17 | Disposition: A | Discharge status: Home / Self Care | Payer: Medicare Other | Attending: Emergency Medicine | Admitting: Emergency Medicine

## 2021-07-17 DIAGNOSIS — R21 Rash and other nonspecific skin eruption: Secondary | ICD-10-CM

## 2021-07-17 DIAGNOSIS — T7840XA Allergy, unspecified, initial encounter: Secondary | ICD-10-CM

## 2021-07-17 MED ORDER — DIPHENHYDRAMINE HCL 25 MG PO TABS: 25.0000 mg | ORAL_TABLET | Freq: Four times a day (QID) | ORAL | 0 refills | Status: DC | PRN

## 2021-07-17 MED ORDER — FAMOTIDINE 20 MG PO TABS
40.0000 mg | ORAL_TABLET | Freq: Once | ORAL | Status: AC
  Administered 2021-07-17: 40 mg via ORAL
  Filled 2021-07-17: qty 2

## 2021-07-17 MED ORDER — FAMOTIDINE 20 MG PO TABS: 20.0000 mg | ORAL_TABLET | Freq: Two times a day (BID) | ORAL | 0 refills | Status: DC

## 2021-07-17 MED ORDER — DIPHENHYDRAMINE HCL 25 MG PO CAPS
50.0000 mg | ORAL_CAPSULE | Freq: Once | ORAL | Status: AC
  Administered 2021-07-17: 50 mg via ORAL
  Filled 2021-07-17: qty 2

## 2021-07-17 NOTE — Discharge Instructions (Signed)
You were evaluated in the Emergency Department and after careful evaluation, we did not find any emergent condition requiring admission or further testing in the hospital.  Your exam/testing today is overall reassuring.  Symptoms seem to be due to an allergic reaction.  Use the Benadryl and Pepcid and medications as directed if you continue to experience any itching.  Recommend close follow-up with your primary care doctor.  Please return to the Emergency Department if you experience any worsening of your condition.   Thank you for allowing [place] to be a part of your care.

## 2021-07-17 NOTE — ED Provider Notes (Signed)
[facility] Emergency Department Provider Note MRN:  [id number]  Arrival date & time: 07/17/21     Chief Complaint   Rash   History of Present Illness   Lisa Sutton is a 88 y.o. year-old female with history of diabetes, stroke presenting to the ED with chief complaint of rash.  Patient took first dose of new diabetes medicine and shortly after began experiencing itchy red rash through her whole body.  No nausea vomiting, no shortness of breath, no other complaints.  Review of Systems  A thorough review of systems was obtained and all systems are negative except as noted in the HPI and PMH.   Patient's Health History    Past Medical History:  Diagnosis Date   Anxiety    Arthritis    Cataract    Dysphagia    'sometimes but not a major issue' been checked out by GI (per pt)   Dysrhythmia    'heart used to skip but doesn't anymore' was checked out by Dr. Ron Parker late '90s, everything checked out ok and not had any skipping since (all per pt)   Essential hypertension, benign    Fatty liver    GERD (gastroesophageal reflux disease)    H/O hypercalcemia 04/12/2017   Hepatitis B    had at age 73, 'GI doc said it's gone away'   Hepatitis B surface antigen positive    High cholesterol    History of hiatal hernia    Hypercalcemia    Hypothyroidism    Kidney stone August 2014   Patient was seen at [facility]   Obesity    Osteoporosis    PONV (postoperative nausea and vomiting)    Type 2 diabetes mellitus ([place])     Past Surgical History:  Procedure Laterality Date   ABDOMINAL HYSTERECTOMY     BILATERAL CARPAL TUNNEL RELEASE     Dr. Marlou Sa at surgical center   BIOPSY  05/26/2017   Procedure: BIOPSY;  Surgeon: Rogene Houston, MD;  [facility];  Service: Endoscopy;;  colon   BIOPSY  10/11/2018   Procedure: BIOPSY;  Surgeon: Rogene Houston, MD;  [facility];  Service: Endoscopy;;   CATARACT EXTRACTION, BILATERAL     COLONOSCOPY  N/A 05/26/2017   Procedure: COLONOSCOPY;  Surgeon: Rogene Houston, MD;  [facility];  Service: Endoscopy;  Laterality: N/A;  10:30   Deviated septum repair     ESOPHAGEAL DILATION N/A 08/16/2019   Procedure: ESOPHAGEAL DILATION;  Surgeon: Rogene Houston, MD;  [facility];  Service: Endoscopy;  Laterality: N/A;   ESOPHAGOGASTRODUODENOSCOPY N/A 07/28/2016   Procedure: ESOPHAGOGASTRODUODENOSCOPY (EGD);  Surgeon: Rogene Houston, MD;  [facility];  Service: Endoscopy;  Laterality: N/A;  300 - moved to 3/7 @ 1:00   ESOPHAGOGASTRODUODENOSCOPY N/A 08/16/2019   Procedure: ESOPHAGOGASTRODUODENOSCOPY (EGD);  Surgeon: Rogene Houston, MD;  [facility];  Service: Endoscopy;  Laterality: N/A;  [place] N/A 10/11/2018   Procedure: FLEXIBLE SIGMOIDOSCOPY;  Surgeon: Rogene Houston, MD;  [facility];  Service: Endoscopy;  Laterality: N/A;  unable to complete colonoscopy    IR CT HEAD LTD  03/07/2021   IR PERCUTANEOUS ART THROMBECTOMY/INFUSION INTRACRANIAL INC DIAG ANGIO  03/07/2021   IR US GUIDE VASC ACCESS RIGHT  03/07/2021   KNEE ARTHROPLASTY Left 09/15/2015   Procedure: COMPUTER ASSISTED TOTAL KNEE ARTHROPLASTY;  Surgeon: Thana Farr  Lorin Mercy, MD;  Location: [place];  Service: Orthopedics;  Laterality: Left;   KNEE ARTHROSCOPY     left knee 2003   RADIOLOGY WITH ANESTHESIA N/A 03/07/2021   Procedure: IR WITH ANESTHESIA;  Surgeon: Corrie Mckusick, DO;  Location: [place];  Service: Anesthesiology;  Laterality: N/A;    Family History  Problem Relation Age of Onset   Hypertension Father    Hip fracture Father    COPD Mother    Heart attack Mother    Cancer Maternal Aunt    Diabetes Maternal Aunt    Diabetes Maternal Uncle    Colon cancer Neg Hx     Social History   Socioeconomic History   Marital status: Widowed    Spouse name: Not on file   Number of children: 1   Years of education: Not on file   Highest  education level: High school graduate  Occupational History    Comment: retired  Tobacco Use   Smoking status: Never   Smokeless tobacco: Never  Vaping Use   Vaping Use: Never used  Substance and Sexual Activity   Alcohol use: No    Alcohol/week: 0.0 standard drinks   Drug use: No   Sexual activity: Never  Other Topics Concern   Not on file  Social History Narrative   07/04/19 Lives with son   Caffeine- coffee 1 c daily   Social Determinants of Health   Financial Resource Strain: Not on file  Food Insecurity: Not on file  Transportation Needs: Not on file  Physical Activity: Not on file  Stress: Not on file  Social Connections: Not on file  Intimate Partner Violence: Not on file     Physical Exam   Vitals:   07/17/21 0121 07/17/21 0220  BP: 128/69 132/84  Pulse: 81 81  Resp: 18 18  Temp:    SpO2: 98% 97%    CONSTITUTIONAL: Well-appearing, NAD NEURO/PSYCH:  Alert and oriented x 3, no focal deficits EYES:  eyes equal and reactive ENT/NECK:  no LAD, no JVD CARDIO: Regular rate, well-perfused, normal S1 and S2 PULM:  CTAB no wheezing or rhonchi GI/GU:  non-distended, non-tender MSK/SPINE:  No gross deformities, no edema SKIN: Erythematous macular rash to arms, legs, torso   *Additional and/or pertinent findings included in MDM below  Diagnostic and Interventional Summary    EKG Interpretation  Date/Time:    Ventricular Rate:    PR Interval:    QRS Duration:   QT Interval:    QTC Calculation:   R Axis:     Text Interpretation:         Labs Reviewed - No data to display  No orders to display    Medications  diphenhydrAMINE (BENADRYL) capsule 50 mg (50 mg Oral Given 07/17/21 0207)  famotidine (PEPCID) tablet 40 mg (40 mg Oral Given 07/17/21 0207)     Procedures  /  Critical Care Procedures  ED Course and Medical Decision Making  Initial Impression and Ddx Consistent with allergic reaction, advised to stop the new medicine, will use  antihistamines.  Given patient's history of diabetes, will try to avoid using steroids for management.  Past medical/surgical history that increases complexity of ED encounter: Diabetes  Interpretation of Diagnostics Not applicable  Patient Reassessment and Ultimate Disposition/Management Discharge home  Patient management required discussion with the following services or consulting groups:  None  Complexity of Problems Addressed Acute complicated illness or Injury  Additional Data Reviewed and Analyzed Further history obtained from: Further history  from spouse/family member  Additional Factors Impacting ED Encounter Risk Prescriptions  Barth Kirks. [place]onia Small, N[place] [email or web address]  Final Clinical Impressions(s) / ED Diagnoses     ICD-10-CM   1. Rash  R21     2. Allergic reaction, initial encounter  T78.40XA       ED Discharge Orders          Ordered    diphenhydrAMINE (BENADRYL) 25 MG tablet  Every 6 hours PRN        07/17/21 0233    famotidine (PEPCID) 20 MG tablet  2 times daily        07/17/21 1595             Discharge Instructions Discussed with and Provided to Patient:    Discharge Instructions      You were evaluated in the Emergency Department and after careful evaluation, we did not find any emergent condition requiring admission or further testing in the hospital.  Your exam/testing today is overall reassuring.  Symptoms seem to be due to an allergic reaction.  Use the Benadryl and Pepcid and medications as directed if you continue to experience any itching.  Recommend close follow-up with your primary care doctor.  Please return to the Emergency Department if you experience any worsening of your condition.   Thank you for allowing [place] to be a part of your care.      Maudie Flakes, MD 07/17/21 [phone]

## 2021-07-22 ENCOUNTER — Other Ambulatory Visit: Payer: Self-pay

## 2021-07-22 ENCOUNTER — Ambulatory Visit (HOSPITAL_COMMUNITY): Payer: Medicare Other | Attending: Family | Admitting: Speech Pathology

## 2021-07-22 ENCOUNTER — Encounter (HOSPITAL_COMMUNITY): Payer: Self-pay | Admitting: Speech Pathology

## 2021-07-22 ENCOUNTER — Encounter (HOSPITAL_COMMUNITY): Payer: Medicare Other | Admitting: Speech Pathology

## 2021-07-22 DIAGNOSIS — R41841 Cognitive communication deficit: Secondary | ICD-10-CM | POA: Diagnosis present

## 2021-07-22 DIAGNOSIS — R4701 Aphasia: Secondary | ICD-10-CM | POA: Diagnosis not present

## 2021-07-22 NOTE — Therapy (Signed)
[place], [place], [address] Phone: [phone]   Fax:  [phone]  Speech Language Pathology Treatment  Patient Details  Name: Lisa Sutton MRN: [id number] Date of Birth: [birth year] Referring Provider (SLP): Etter Sjogren, [facility]   Encounter Date: 07/22/2021   End of Session - 07/22/21 0957     Visit Number 11    Number of Visits 13    Date for SLP Re-Evaluation 08/06/21   before 10th visit   Authorization Type UHC Medicare   [employer] Med 05/24/2021-05/23/2022  OOP 3,600  co-pay 20  no DED, no visit limit, no auth   SLP Start Time 0945    SLP Stop Time  1030    SLP Time Calculation (min) 45 min    Activity Tolerance Patient tolerated treatment well             Past Medical History:  Diagnosis Date   Anxiety    Arthritis    Cataract    Dysphagia    'sometimes but not a major issue' been checked out by GI (per pt)   Dysrhythmia    'heart used to skip but doesn't anymore' was checked out by Dr. Ron Parker late '90s, everything checked out ok and not had any skipping since (all per pt)   Essential hypertension, benign    Fatty liver    GERD (gastroesophageal reflux disease)    H/O hypercalcemia 04/12/2017   Hepatitis B    had at age 42, 'GI doc said it's gone away'   Hepatitis B surface antigen positive    High cholesterol    History of hiatal hernia    Hypercalcemia    Hypothyroidism    Kidney stone August 2014   Patient was seen at [facility]   Obesity    Osteoporosis    PONV (postoperative nausea and vomiting)    Type 2 diabetes mellitus ([place])     Past Surgical History:  Procedure Laterality Date   ABDOMINAL HYSTERECTOMY     BILATERAL CARPAL TUNNEL RELEASE     Dr. Marlou Sa at surgical center   BIOPSY  05/26/2017   Procedure: BIOPSY;  Surgeon: Rogene Houston, MD;  [facility];  Service: Endoscopy;;  colon   BIOPSY  10/11/2018   Procedure: BIOPSY;  Surgeon: Rogene Houston, MD;  [facility];  Service: Endoscopy;;   CATARACT EXTRACTION, BILATERAL     COLONOSCOPY N/A 05/26/2017   Procedure: COLONOSCOPY;  Surgeon: Rogene Houston, MD;  [facility];  Service: Endoscopy;  Laterality: N/A;  10:30   Deviated septum repair     ESOPHAGEAL DILATION N/A 08/16/2019   Procedure: ESOPHAGEAL DILATION;  Surgeon: Rogene Houston, MD;  [facility];  Service: Endoscopy;  Laterality: N/A;   ESOPHAGOGASTRODUODENOSCOPY N/A 07/28/2016   Procedure: ESOPHAGOGASTRODUODENOSCOPY (EGD);  Surgeon: Rogene Houston, MD;  [facility];  Service: Endoscopy;  Laterality: N/A;  300 - moved to 3/7 @ 1:00   ESOPHAGOGASTRODUODENOSCOPY N/A 08/16/2019   Procedure: ESOPHAGOGASTRODUODENOSCOPY (EGD);  Surgeon: Rogene Houston, MD;  [facility];  Service: Endoscopy;  Laterality: N/A;  [place] N/A 10/11/2018   Procedure: FLEXIBLE SIGMOIDOSCOPY;  Surgeon: Rogene Houston, MD;  [facility];  Service: Endoscopy;  Laterality: N/A;  unable to complete colonoscopy    IR CT HEAD LTD  03/07/2021   IR PERCUTANEOUS ART THROMBECTOMY/INFUSION INTRACRANIAL  INC DIAG ANGIO  03/07/2021   IR US GUIDE VASC ACCESS RIGHT  03/07/2021   KNEE ARTHROPLASTY Left 09/15/2015   Procedure: COMPUTER ASSISTED TOTAL KNEE ARTHROPLASTY;  Surgeon: Marybelle Killings, MD;  Location: [place];  Service: Orthopedics;  Laterality: Left;   KNEE ARTHROSCOPY     left knee 2003   RADIOLOGY WITH ANESTHESIA N/A 03/07/2021   Procedure: IR WITH ANESTHESIA;  Surgeon: Corrie Mckusick, DO;  Location: [place];  Service: Anesthesiology;  Laterality: N/A;    There were no vitals filed for this visit.   Subjective Assessment - 07/22/21 0955     Subjective "I just don't feel like I will ever get any better."    Currently in Pain? No/denies              ADULT SLP TREATMENT - 07/22/21 0955       General Information   Behavior/Cognition Alert;Cooperative;Pleasant mood     Patient Positioning Upright in chair    Oral care provided N/A    HPI Lisa Sutton is a 88 y.o. female with PMH significant for HTN, GERD, Hep B, HLD, Obesity, osteoporosis who initially presented to [facility] ED with aphasia.  CT angio was done which showed left M2 occlusion, she was brought to [facility], underwent mechanical thrombectomy. MRI was remarkable for acute infarcts in the L MCA territory and right parietal cortex.  Pt was intubated less than 24 hrs on 10/15.  Pt has a hx of esophageal dysphagia. She received SLP [facility] at [facility] during acute admission, [facility] for 2 weeks SNF, and was discharged from home health SLP services last week. She was referred for outpatient SLP therapy by Etter Sjogren, PA-C.      Treatment Provided   Treatment provided Cognitive-Linquistic      Pain Assessment   Pain Assessment No/denies pain      Cognitive-Linquistic Treatment   Treatment focused on Aphasia;Patient/family/caregiver education;Apraxia    Skilled Treatment SLP provided skilled      Assessment / Recommendations / Plan   Plan Continue with current plan of care      Progression Toward Goals   Progression toward goals Progressing toward goals    Patient/Family Stated Goal Improve speech    Time for goal achievement 08/06/21    Goal formation with patient/family    Potential to Achieve Goals Good    Potential Considerations Severity of impairments                SLP Short Term Goals - 07/22/21 1003       SLP SHORT TERM GOAL #1   Title Pt will describe objects and pictures by providing at least three salient features as judged by clinician with 80% acc when provided mi/mod cues.    Baseline 70%    Time 4    Period Weeks    Status Achieved    Target Date 06/29/21      SLP SHORT TERM GOAL #2   Title Pt will implement word-finding strategies with 85% accuracy when unable to verbalize desired word in conversation/functional tasks with min assist.    change to 85% with min assist, 07/09/21   Baseline 65%, new baseline 07/09/21 is 80% acc with mi/mod assist    Time 4    Period Weeks    Status On-going    Target Date 06/29/21      SLP SHORT TERM GOAL #3   Title Pt will complete reading comprehension tasks (short phrases)  with 80% acc and min assist via multiple choice response.    Baseline max assist    Time 4    Period Weeks    Status Achieved    Target Date 06/29/21      SLP SHORT TERM GOAL #4   Title Pt will respond to open-ended questions with 90% acc and indirect cues with use of personalized communication template (personally relevant vocabulary).   Change to 90% acc with indirect cues   Baseline 07/09/21 90% acc min cues    Time 4    Period Weeks    Status On-going    Target Date 06/29/21      SLP SHORT TERM GOAL #5   Title Pt will repeat short functional phrases/sentences with 80% acc when provided mi/mod cues for error awareness.    Baseline 40%    Time 4    Period Weeks    Status Achieved    Target Date 06/29/21      SLP SHORT TERM GOAL #6   Title Pt will provide verbal sequence and/or moderately complex verbal explanations for "how to" questions with 90% effectiveness and min cues as needed.    Baseline 90% acc with mod cues    Time 4    Period Weeks    Status On-going    Target Date 08/06/21                Plan - 07/22/21 0958     Clinical Impression Statement Pt alert and cooperative for therapy this date. She reports feeling discouraged about her speech and her visual changes. She went to the eye doctor, but stated that glasses wouldn't help her due to stroke related changes. Pt described pictures (verb scenes) with 100% acc with allowance for paraphasic errors and min cues. SLP cued Pt to silently say the sentence first, which improved verbal fluency. She was also cued to add gestures (pretend to tie shoe) when unable to verbalized the desired verb. She repeated phrases/short sentences with 100% acc with  allowance for approximations. SLP encouraged Pt to move through the sentence if she was able to verbalize a close approximation rather than trying to self correct over and over again, as this seems to increase her frustration. SLP shared that the listener likely understands and that it is ok to not try to correct every word. She expressed relief at this and stated that her neighbor shared the same. Continue to target complex verbal expression next week.   Speech Therapy Frequency 1x /week    Duration Other (comment)   2 more weeks   Treatment/Interventions Compensatory strategies;Cueing hierarchy;Patient/family education;Functional tasks;Multimodal communcation approach;SLP instruction and feedback;Language facilitation    Potential to Achieve Goals Good    SLP Home Exercise Plan Pt will completed HEP as assigned to facilitate carryover of treatment strategies and techniques in home environment with use of written cues as needed.    Consulted and Agree with Plan of Care Patient;Family member/caregiver    Family Member Waverly Son, [place]             Patient will benefit from skilled therapeutic intervention in order to improve the following deficits and impairments:   Aphasia  Cognitive communication deficit    Problem List Patient Active Problem List   Diagnosis Date Noted   Hypertensive emergency    Acute ischemic left MCA stroke ([place]) 03/07/2021   Cerebrovascular accident (CVA) ([place])    Encounter for intubation    Pruritus ani 08/05/2020   History  of hepatitis B 02/05/2020   Chronic diarrhea 07/24/2019   Proteinuria 07/16/2019   GAD (generalized anxiety disorder) 03/02/2019   Controlled substance agreement signed 03/02/2019   Hyperlipidemia associated with type 2 diabetes mellitus ([place]) 02/19/2019   CKD stage 3 due to type 2 diabetes mellitus ([place]) 02/19/2019   Primary hypertension 02/19/2019   Elevated alkaline phosphatase level 01/15/2019   Stage 3 chronic kidney disease  ([place]) 06/15/2017   History of colonic polyps 05/09/2017   Presbycusis of both ears 06/04/2016   Hypothyroidism due to acquired atrophy of thyroid 04/26/2016   Vitamin D deficiency 04/26/2016   Status post total left knee replacement 09/15/2015   Statin intolerance 09/11/2015   Diabetic neuropathy, type II diabetes mellitus ([place]) 07/30/2015   Abnormal chest x-ray 11/27/2014   Primary insomnia 04/12/2013   GERD (gastroesophageal reflux disease) 04/25/2012   Fatty liver 08/09/2011   Esophageal dysphagia 08/09/2011   Type 2 diabetes mellitus with diabetic nephropathy, without long-term current use of insulin ([place]) 08/09/2011   Thank you,  Genene Churn, [place]  [place], [place] 07/22/2021, 10:04 AM  [place], [place], [address] Phone: [phone]   Fax:  [phone]   Name: LACHRISTA HESLIN MRN: [id number] Date of Birth: 1933/06/08

## 2021-07-23 ENCOUNTER — Encounter (HOSPITAL_COMMUNITY): Payer: Medicare Other | Admitting: Speech Pathology

## 2021-07-29 ENCOUNTER — Encounter (HOSPITAL_COMMUNITY): Payer: Medicare Other | Admitting: Speech Pathology

## 2021-07-29 ENCOUNTER — Other Ambulatory Visit: Payer: Self-pay

## 2021-07-29 ENCOUNTER — Ambulatory Visit (HOSPITAL_COMMUNITY): Payer: Medicare Other | Admitting: Speech Pathology

## 2021-07-29 ENCOUNTER — Encounter (HOSPITAL_COMMUNITY): Payer: Self-pay | Admitting: Speech Pathology

## 2021-07-29 DIAGNOSIS — R4701 Aphasia: Secondary | ICD-10-CM | POA: Diagnosis not present

## 2021-07-29 DIAGNOSIS — R41841 Cognitive communication deficit: Secondary | ICD-10-CM

## 2021-07-29 NOTE — Therapy (Signed)
[place], [place], [address] Phone: [phone]   Fax:  [phone]  Speech Language Pathology Treatment  Patient Details  Name: Lisa Sutton MRN: [id number] Date of Birth: 05/14/1934 Referring Provider (SLP): Etter Sjogren, [facility]   Encounter Date: 07/29/2021   End of Session - 07/29/21 1042     Visit Number 12    Number of Visits 13    Date for SLP Re-Evaluation 08/06/21   before 10th visit   Authorization Type UHC Medicare   [employer] Med 05/24/2021-05/23/2022  OOP 3,600  co-pay 20  no DED, no visit limit, no auth   SLP Start Time 0945    SLP Stop Time  1030    SLP Time Calculation (min) 45 min    Activity Tolerance Patient tolerated treatment well             Past Medical History:  Diagnosis Date   Anxiety    Arthritis    Cataract    Dysphagia    'sometimes but not a major issue' been checked out by GI (per pt)   Dysrhythmia    'heart used to skip but doesn't anymore' was checked out by Dr. Ron Parker late '90s, everything checked out ok and not had any skipping since (all per pt)   Essential hypertension, benign    Fatty liver    GERD (gastroesophageal reflux disease)    H/O hypercalcemia 04/12/2017   Hepatitis B    had at age 67, 'GI doc said it's gone away'   Hepatitis B surface antigen positive    High cholesterol    History of hiatal hernia    Hypercalcemia    Hypothyroidism    Kidney stone August 2014   Patient was seen at [facility]   Obesity    Osteoporosis    PONV (postoperative nausea and vomiting)    Type 2 diabetes mellitus ([place])     Past Surgical History:  Procedure Laterality Date   ABDOMINAL HYSTERECTOMY     BILATERAL CARPAL TUNNEL RELEASE     Dr. Marlou Sa at surgical center   BIOPSY  05/26/2017   Procedure: BIOPSY;  Surgeon: Rogene Houston, MD;  [facility];  Service: Endoscopy;;  colon   BIOPSY  10/11/2018   Procedure: BIOPSY;  Surgeon: Rogene Houston, MD;  [facility];  Service: Endoscopy;;   CATARACT EXTRACTION, BILATERAL     COLONOSCOPY N/A 05/26/2017   Procedure: COLONOSCOPY;  Surgeon: Rogene Houston, MD;  [facility];  Service: Endoscopy;  Laterality: N/A;  10:30   Deviated septum repair     ESOPHAGEAL DILATION N/A 08/16/2019   Procedure: ESOPHAGEAL DILATION;  Surgeon: Rogene Houston, MD;  [facility];  Service: Endoscopy;  Laterality: N/A;   ESOPHAGOGASTRODUODENOSCOPY N/A 07/28/2016   Procedure: ESOPHAGOGASTRODUODENOSCOPY (EGD);  Surgeon: Rogene Houston, MD;  [facility];  Service: Endoscopy;  Laterality: N/A;  300 - moved to 3/7 @ 1:00   ESOPHAGOGASTRODUODENOSCOPY N/A 08/16/2019   Procedure: ESOPHAGOGASTRODUODENOSCOPY (EGD);  Surgeon: Rogene Houston, MD;  [facility];  Service: Endoscopy;  Laterality: N/A;  [place] N/A 10/11/2018   Procedure: FLEXIBLE SIGMOIDOSCOPY;  Surgeon: Rogene Houston, MD;  [facility];  Service: Endoscopy;  Laterality: N/A;  unable to complete colonoscopy    IR CT HEAD LTD  03/07/2021   IR PERCUTANEOUS ART THROMBECTOMY/INFUSION INTRACRANIAL  INC DIAG ANGIO  03/07/2021   IR US GUIDE VASC ACCESS RIGHT  03/07/2021   KNEE ARTHROPLASTY Left 09/15/2015   Procedure: COMPUTER ASSISTED TOTAL KNEE ARTHROPLASTY;  Surgeon: Marybelle Killings, MD;  Location: [place];  Service: Orthopedics;  Laterality: Left;   KNEE ARTHROSCOPY     left knee 2003   RADIOLOGY WITH ANESTHESIA N/A 03/07/2021   Procedure: IR WITH ANESTHESIA;  Surgeon: Corrie Mckusick, DO;  Location: [place];  Service: Anesthesiology;  Laterality: N/A;    There were no vitals filed for this visit.   Subjective Assessment - 07/29/21 1001     Subjective "Do you think I'll ever be able to play... I mean drive?"    Currently in Pain? No/denies               ADULT SLP TREATMENT - 07/29/21 1001       General Information   Behavior/Cognition Alert;Cooperative;Pleasant  mood    Patient Positioning Upright in chair    Oral care provided N/A    HPI Lisa Sutton is a 88 y.o. female with PMH significant for HTN, GERD, Hep B, HLD, Obesity, osteoporosis who initially presented to [facility] [place] ED with aphasia.  CT angio was done which showed left M2 occlusion, she was brought to [facility], underwent mechanical thrombectomy. MRI was remarkable for acute infarcts in the L MCA territory and right parietal cortex.  Pt was intubated less than 24 hrs on 10/15.  Pt has a hx of esophageal dysphagia. She received SLP therapy at [facility] during acute admission, [facility] for 2 weeks SNF, and was discharged from home health SLP services last week. She was referred for outpatient SLP therapy by Etter Sjogren, PA-C.      Treatment Provided   Treatment provided Cognitive-Linquistic      Pain Assessment   Pain Assessment No/denies pain      Cognitive-Linquistic Treatment   Treatment focused on Aphasia;Patient/family/caregiver education;Apraxia    Skilled Treatment SLP provided skilled treatment targeting expressive language goals with a focus on use of word finding strategies and moving through word approximations (if listener understands intent, ok to keep going).      Assessment / Recommendations / Plan   Plan Discharge SLP treatment due to (comment)      Progression Toward Goals   Progression toward goals Goals met, education completed, patient discharged from SLP    Patient/Family Stated Goal Improve speech              SLP Education - 07/29/21 1041     Education Details Plan for d/c and to resume previously enjoyed activities (silver sneakers)    Person(s) Educated Patient;Child(ren)    Methods Explanation    Comprehension Verbalized understanding              SLP Short Term Goals - 07/29/21 1043       SLP SHORT TERM GOAL #1   Title Pt will describe objects and pictures by providing at least three salient features as judged by clinician  with 80% acc when provided mi/mod cues.    Baseline 70%    Time 4    Period Weeks    Status Achieved    Target Date 06/29/21      SLP SHORT TERM GOAL #2   Title Pt will implement word-finding strategies with 85% accuracy when unable to verbalize desired word in conversation/functional tasks with min assist.   change to 85% with min assist, 07/09/21  Baseline 65%, new baseline 07/09/21 is 80% acc with mi/mod assist    Time 4    Period Weeks    Status Achieved    Target Date 06/29/21      SLP SHORT TERM GOAL #3   Title Pt will complete reading comprehension tasks (short phrases) with 80% acc and min assist via multiple choice response.    Baseline max assist    Time 4    Period Weeks    Status Achieved    Target Date 06/29/21      SLP SHORT TERM GOAL #4   Title Pt will respond to open-ended questions with 90% acc and indirect cues with use of personalized communication template (personally relevant vocabulary).   Change to 90% acc with indirect cues   Baseline 07/09/21 90% acc min cues    Time 4    Period Weeks    Status Achieved    Target Date 06/29/21      SLP SHORT TERM GOAL #5   Title Pt will repeat short functional phrases/sentences with 80% acc when provided mi/mod cues for error awareness.    Baseline 40%    Time 4    Period Weeks    Status Achieved    Target Date 06/29/21      SLP SHORT TERM GOAL #6   Title Pt will provide verbal sequence and/or moderately complex verbal explanations for "how to" questions with 90% effectiveness and min cues as needed.    Baseline 90% acc with mod cues    Time 4    Period Weeks    Status Achieved    Target Date 08/06/21                Plan - 07/29/21 1043     Clinical Impression Statement Pt alert and cooperative for therapy this date. The session was spent on participation in moderately complex conversation and answering open ended questions. She answered open ended questions with 100% acc with allowance for paraphasic  (close approximations) errors. Pt encouraged to continue talking if her errors were "close' and still intelligible to listener. She asked whether she would be able to drive again and SLP explained that at this point, she would be unsafe to drive due to her perceptual deficits on her right side. She was encouraged to see how she progresses over the next 6 months to a year and consider contacting [place] if she feels she is ready to drive (at this point, it would be a waste of her money I believe). She continues to express frustration about her loss of independence with driving and communication. She made the most progress in the areas of naming objects (40%-80%), automatic speech (60%-90%), and repetition (40%-80%). She is able to communicate moderately complex wants/needs and can use her personalized communication template as needed. Pt made good progress toward goals and has begun to plateau. Pt and SLP discussed that the next best step in her recovery would be to resume previous social activities with her friends Paramed[employer]) and perhaps going to the putting course. Pt is ready for d/c from SLP services at this time.    Duration --    Treatment/Interventions Compensatory strategies;Cueing hierarchy;Patient/family education;Functional tasks;Multimodal communcation approach;SLP instruction and feedback;Language facilitation    Potential to Achieve Goals Good    SLP Home Exercise Plan Pt will completed HEP as assigned to facilitate carryover of treatment strategies and techniques in home environment with use of written cues as needed.  Consulted and Agree with Plan of Care Patient;Family member/caregiver    Family Member [place] Sutton, Lisa             Patient will benefit from skilled therapeutic intervention in order to improve the following deficits and impairments:   Aphasia  Cognitive communication deficit    Problem List Patient Active Problem List   Diagnosis Date  Noted   Hypertensive emergency    Acute ischemic left MCA stroke ([place]) 03/07/2021   Cerebrovascular accident (CVA) ([place])    Encounter for intubation    Pruritus ani 08/05/2020   History of hepatitis B 02/05/2020   Chronic diarrhea 07/24/2019   Proteinuria 07/16/2019   GAD (generalized anxiety disorder) 03/02/2019   Controlled substance agreement signed 03/02/2019   Hyperlipidemia associated with type 2 diabetes mellitus ([place]) 02/19/2019   CKD stage 3 due to type 2 diabetes mellitus ([place]) 02/19/2019   Primary hypertension 02/19/2019   Elevated alkaline phosphatase level 01/15/2019   Stage 3 chronic kidney disease ([place]) 06/15/2017   History of colonic polyps 05/09/2017   Presbycusis of both ears 06/04/2016   Hypothyroidism due to acquired atrophy of thyroid 04/26/2016   Vitamin D deficiency 04/26/2016   Status post total left knee replacement 09/15/2015   Statin intolerance 09/11/2015   Diabetic neuropathy, type II diabetes mellitus ([place]) 07/30/2015   Abnormal chest x-ray 11/27/2014   Primary insomnia 04/12/2013   GERD (gastroesophageal reflux disease) 04/25/2012   Fatty liver 08/09/2011   Esophageal dysphagia 08/09/2011   Type 2 diabetes mellitus with diabetic nephropathy, without long-term current use of insulin ([place]) 08/09/2011   SPEECH THERAPY DISCHARGE SUMMARY  Visits from Start of Care: 12  Current functional level related to goals / functional outcomes: See above   Remaining deficits: See above, expressive aphasia persists   Education / Equipment: Provided   Patient agrees to discharge. Patient goals were met. Patient is being discharged due to being pleased with the current functional level..   Thank you,  Genene Churn, [place]  Teddy Spike 07/29/2021, 10:59 AM  [place], [place], [address] Phone: [phone]   Fax:  [phone]   Name: Lisa Sutton MRN:  [id number] Date of Birth: [birth year]

## 2021-07-30 ENCOUNTER — Encounter (HOSPITAL_COMMUNITY): Payer: Medicare Other | Admitting: Speech Pathology

## 2021-08-04 ENCOUNTER — Encounter (INDEPENDENT_AMBULATORY_CARE_PROVIDER_SITE_OTHER): Payer: Self-pay | Admitting: Internal Medicine

## 2021-08-04 ENCOUNTER — Ambulatory Visit (INDEPENDENT_AMBULATORY_CARE_PROVIDER_SITE_OTHER): Payer: Medicare Other | Admitting: Internal Medicine

## 2021-08-04 ENCOUNTER — Other Ambulatory Visit: Payer: Self-pay

## 2021-08-04 VITALS — BP 128/79 | HR 85 | Temp 98.5°F | Ht 63.0 in | Wt 155.0 lb

## 2021-08-04 DIAGNOSIS — K219 Gastro-esophageal reflux disease without esophagitis: Secondary | ICD-10-CM | POA: Diagnosis not present

## 2021-08-04 DIAGNOSIS — K529 Noninfective gastroenteritis and colitis, unspecified: Secondary | ICD-10-CM

## 2021-08-04 NOTE — Patient Instructions (Signed)
Please call office with progress report in first week of June 2023. ?If she continues to do well from GI standpoint will consider dropping pantoprazole dose to 20 mg daily. ?

## 2021-08-04 NOTE — Progress Notes (Signed)
Presenting complaint; ? ?Follow-up for chronic GERD and diarrhea. ? ?Database and subjective: ? ?Patient is 88 year old Caucasian female who is here for scheduled visit accompanied by her son Heron Sabins. ?She was last seen 1 year ago and was doing well. ?Patient suffered stroke in October 2022 and has difficulty expressing.  Therefore her son is helping with history. ? ?She had a flu shot on March 06, 2021 and a day later she developed acute CVA.  She underwent mechanical thrombectomy from left MCA branches.  She has been undergoing physical therapy.  She has mild weakness to her right hand.  She is using walker to ambulate.  She has not had any falling episodes.  During all of this ordeal she did not have any difficulty swallowing.  She still experiences mild dizziness.  She feels heartburn is well controlled.  Her bowels are moving daily.  She is not having diarrhea and or constipation.  She takes alprazolam occasionally.  She was begun on Farxiga last month and she developed itching and the medication was discontinued. ? ?Current Medications: ?Outpatient Encounter Medications as of 08/04/2021  ?Medication Sig  ? ALPRAZolam (XANAX) 0.25 MG tablet 1 tablet as needed  ? amLODipine (NORVASC) 10 MG tablet Take 10 mg by mouth daily.  ? aspirin EC 81 MG tablet Take 81 mg by mouth daily. Swallow whole.  ? atenolol (TENORMIN) 25 MG tablet Take 1 tablet (25 mg total) by mouth daily.  ? clopidogrel (PLAVIX) 75 MG tablet Take 1 tablet (75 mg total) by mouth daily.  ? diphenhydrAMINE (BENADRYL) 25 MG tablet Take 1 tablet (25 mg total) by mouth every 6 (six) hours as needed.  ? DULoxetine (CYMBALTA) 30 MG capsule Take 1 capsule (30 mg total) by mouth daily. (Patient taking differently: Take 30 mg by mouth at bedtime.)  ? irbesartan (AVAPRO) 150 MG tablet Take 1 tablet (150 mg total) by mouth daily.  ? levothyroxine (SYNTHROID) 125 MCG tablet Take 1 tablet (125 mcg total) by mouth daily before breakfast.  ? pantoprazole (PROTONIX)  40 MG tablet Take 1 tablet (40 mg total) by mouth daily before lunch.  ? rosuvastatin (CRESTOR) 5 MG tablet TAKE 1 TABLET BY MOUTH  DAILY (Patient taking differently: Take 5 mg by mouth at bedtime.)  ? [DISCONTINUED] aspirin EC 325 MG EC tablet Take 1 tablet (325 mg total) by mouth daily.  ? [DISCONTINUED] b complex vitamins tablet Take 1 tablet by mouth daily. (Patient not taking: Reported on 05/04/2021)  ? [DISCONTINUED] Biotin 2500 MCG CAPS Take by mouth daily. (Patient not taking: Reported on 05/04/2021)  ? [DISCONTINUED] calcium citrate-vitamin D 200-200 MG-UNIT TABS Take 1 tablet by mouth daily.    ? [DISCONTINUED] Cholecalciferol (VITAMIN D-3) 25 MCG (1000 UT) CAPS 2 capsule (Patient not taking: Reported on 05/04/2021)  ? [DISCONTINUED] Cyanocobalamin (VITAMIN B12) 1000 MCG TBCR 1 tablet (Patient not taking: Reported on 05/04/2021)  ? [DISCONTINUED] famotidine (PEPCID) 20 MG tablet Take 1 tablet (20 mg total) by mouth 2 (two) times daily.  ? ?No facility-administered encounter medications on file as of 08/04/2021.  ? ? ? ?Objective: ?Blood pressure 128/79, pulse 85, temperature 98.5 ?F (36.9 ?C), temperature source Oral, height 5' 3"  (1.6 m), weight 155 lb (70.3 kg). ?Patient is alert and in no acute distress. ?She is having difficulty answering some of the questions.  She has difficulty with some words and numbers. ?Conjunctiva is pink. Sclera is nonicteric ?Oropharyngeal mucosa is normal. ?No neck masses or thyromegaly noted. ?Cardiac exam with regular rhythm  normal S1 and S2. No murmur or gallop noted. ?Lungs are clear to auscultation. ?Abdomen is soft and nontender with organomegaly or masses. ?No LE edema or clubbing noted. ? ?Labs/studies Results: ? ? ?CBC Latest Ref Rng & Units 03/12/2021 03/11/2021 03/10/2021  ?WBC 4.0 - 10.5 K/uL 8.6 8.2 8.1  ?Hemoglobin 12.0 - 15.0 g/dL 12.8 12.0 11.3(L)  ?Hematocrit 36.0 - 46.0 % 38.9 35.2(L) 33.6(L)  ?Platelets 150 - 400 K/uL 270 245 224  ?  ?CMP Latest Ref Rng &  Units 03/12/2021 03/11/2021 03/10/2021  ?Glucose 70 - 99 mg/dL 138(H) 125(H) 134(H)  ?BUN 8 - 23 mg/dL 29(H) 23 20  ?Creatinine 0.44 - 1.00 mg/dL 1.99(H) 1.52(H) 1.56(H)  ?Sodium 135 - 145 mmol/L 140 133(L) 139  ?Potassium 3.5 - 5.1 mmol/L 4.5 4.2 4.1  ?Chloride 98 - 111 mmol/L 111 108 112(H)  ?CO2 22 - 32 mmol/L 19(L) 17(L) 20(L)  ?Calcium 8.9 - 10.3 mg/dL 8.5(L) 8.0(L) 8.3(L)  ?Total Protein 6.5 - 8.1 g/dL - - -  ?Total Bilirubin 0.3 - 1.2 mg/dL - - -  ?Alkaline Phos 38 - 126 U/L - - -  ?AST 15 - 41 U/L - - -  ?ALT 0 - 44 U/L - - -  ?  ?Hepatic Function Latest Ref Rng & Units 03/06/2021 03/02/2021 02/05/2020  ?Total Protein 6.5 - 8.1 g/dL 6.9 7.1 6.6  ?Albumin 3.5 - 5.0 g/dL 3.7 3.8 -  ?AST 15 - 41 U/L 19 16 18   ?ALT 0 - 44 U/L 13 13 10   ?Alk Phosphatase 38 - 126 U/L 66 67 -  ?Total Bilirubin 0.3 - 1.2 mg/dL 0.3 0.6 0.4  ?Bilirubin, Direct 0.0 - 0.2 mg/dL - - 0.1  ?  ?No results found for: CRP  ? ? ?Assessment: ? ?#1.  Chronic GERD complicated by esophageal stricture.  She is doing well with PPI therapy.  We will see how she does over the next 3 months and if she continues to do well we will consider dropping PPI dose to 20 mg daily. ? ?#2.  Chronic diarrhea.  She had extensive work-up and [place] impression was that she had IBS and she responded to Metamucil.  She is not having diarrhea anymore and not taking Metamucil.  If diarrhea recurs she can go back to taking Metamucil 4 g daily. ? ?#3.  History of hepatitis B inactive carrier state.  She cleared spontaneously.  Hepatitis B surface antigen was negative in October 2016.  No further work-up needed. ? ? ?Plan: ? ?Patient and/or son will call with progress report in first week of June 2023.  If she continues to do well will consider dropping pantoprazole dose to 20 mg daily. ?Patient will call office if she develops dysphagia. ?Office visit in 1 year. ? ? ? ? ? ?

## 2021-08-05 ENCOUNTER — Encounter (HOSPITAL_COMMUNITY): Payer: Self-pay | Admitting: Speech Pathology

## 2021-10-14 ENCOUNTER — Ambulatory Visit: Payer: Medicare Other | Admitting: Dermatology

## 2021-10-20 ENCOUNTER — Encounter: Payer: Self-pay | Admitting: *Deleted

## 2021-10-21 ENCOUNTER — Ambulatory Visit: Payer: Medicare Other | Admitting: Dermatology

## 2021-10-21 ENCOUNTER — Encounter: Payer: Self-pay | Admitting: Dermatology

## 2021-10-21 DIAGNOSIS — L821 Other seborrheic keratosis: Secondary | ICD-10-CM

## 2021-10-21 DIAGNOSIS — D692 Other nonthrombocytopenic purpura: Secondary | ICD-10-CM

## 2021-10-21 DIAGNOSIS — Z1283 Encounter for screening for malignant neoplasm of skin: Secondary | ICD-10-CM | POA: Diagnosis not present

## 2021-10-21 DIAGNOSIS — L570 Actinic keratosis: Secondary | ICD-10-CM

## 2021-10-22 ENCOUNTER — Ambulatory Visit: Payer: Medicare Other | Admitting: Diagnostic Neuroimaging

## 2021-10-22 ENCOUNTER — Encounter: Payer: Self-pay | Admitting: Diagnostic Neuroimaging

## 2021-10-22 VITALS — BP 116/72 | HR 63 | Ht 63.0 in | Wt 162.8 lb

## 2021-10-22 DIAGNOSIS — E785 Hyperlipidemia, unspecified: Secondary | ICD-10-CM

## 2021-10-22 DIAGNOSIS — E1169 Type 2 diabetes mellitus with other specified complication: Secondary | ICD-10-CM | POA: Diagnosis not present

## 2021-10-22 DIAGNOSIS — I6930 Unspecified sequelae of cerebral infarction: Secondary | ICD-10-CM | POA: Diagnosis not present

## 2021-10-22 DIAGNOSIS — E1121 Type 2 diabetes mellitus with diabetic nephropathy: Secondary | ICD-10-CM | POA: Diagnosis not present

## 2021-10-22 NOTE — Progress Notes (Signed)
[facility]  PATIENT: Lisa Sutton DOB: 1933-10-02  REFERRING CLINICIAN: Drue Second I* HISTORY FROM: patient REASON FOR VISIT: new consult   HISTORICAL  CHIEF COMPLAINT:  Chief Complaint  Patient presents with   Dizziness    Rm 6 Est pt with new issue,  son- Heron Sabins "ever since my stroke Oct 2022 I don't feel good, I get dizzy at times, vision isn't good, saw eye dr and cardiologist after discharge"     HISTORY OF PRESENT ILLNESS:   88 year old female with hypertension, hyperlipidemia and diabetes, here for stroke follow up. 03/02/21 had event of driving into garage and being confused. Went to ER and had UTI dx'd. Then 03/06/21, had recurrence of language and confusion changes. Dx'd with left MCA stroke, tx with mech thrombectomy. Stroke workup completed. Then went to rehab, and then back home by Nov 2022. Completed outpatient cardiac monitor (negative). Therapies completed at home.   Now with mild language changes and right vision changes.   REVIEW OF SYSTEMS: Full 14 system review of systems performed and negative with exception of: as per HPI.  ALLERGIES: Allergies  Allergen Reactions   Statins Other (See Comments)    'discomfort, aching everywhere' Tolerates to livalo    Other     Allergic to diabetic medication but unsure of name. Went to ED February 2023 (ED note I do not see name of med)    Zetia [Ezetimibe] Other (See Comments)    Leg pain   Zocor [Simvastatin] Other (See Comments)    Leg pain    HOME MEDICATIONS: Outpatient Medications Prior to Visit  Medication Sig Dispense Refill   ACCU-CHEK GUIDE test strip 3 (three) times daily.     ALPRAZolam (XANAX) 0.25 MG tablet 1 tablet as needed     amLODipine (NORVASC) 10 MG tablet Take 10 mg by mouth daily.     aspirin EC 81 MG tablet Take 81 mg by mouth daily. Swallow whole.     atenolol (TENORMIN) 25 MG tablet Take 1 tablet (25 mg total) by mouth daily. 30 tablet 0    diphenhydrAMINE (BENADRYL) 25 MG tablet Take 1 tablet (25 mg total) by mouth every 6 (six) hours as needed. 20 tablet 0   DULoxetine (CYMBALTA) 30 MG capsule Take 1 capsule (30 mg total) by mouth daily. (Patient taking differently: Take 30 mg by mouth at bedtime.) 90 capsule 0   hydrOXYzine (ATARAX) 50 MG tablet Take 50 mg by mouth at bedtime as needed.     irbesartan (AVAPRO) 150 MG tablet Take 1 tablet (150 mg total) by mouth daily. 60 tablet 0   levothyroxine (SYNTHROID) 125 MCG tablet Take 1 tablet (125 mcg total) by mouth daily before breakfast. 90 tablet 3   losartan (COZAAR) 100 MG tablet Take 100 mg by mouth daily.     meclizine (ANTIVERT) 25 MG tablet SMARTSIG:1 Tablet(s) By Mouth Every 12 Hours PRN     metFORMIN (GLUCOPHAGE-XR) 500 MG 24 hr tablet Take 500 mg by mouth daily.     ondansetron (ZOFRAN) 4 MG tablet Take 4 mg by mouth every 8 (eight) hours as needed.     pantoprazole (PROTONIX) 40 MG tablet Take 1 tablet (40 mg total) by mouth daily before lunch. 90 tablet 3   rosuvastatin (CRESTOR) 40 MG tablet Take 40 mg by mouth at bedtime.     triamcinolone cream (KENALOG) 0.1 % Apply topically.     clopidogrel (PLAVIX) 75 MG tablet Take 1 tablet (75  mg total) by mouth daily. 30 tablet 0   No facility-administered medications prior to visit.    PAST MEDICAL HISTORY: Past Medical History:  Diagnosis Date   Anxiety    Arthritis    Cataract    CVA (cerebral vascular accident) ([place]) 03/06/2021   Dizziness    Dysphagia    'sometimes but not a major issue' been checked out by GI (per pt)   Dysrhythmia    'heart used to skip but doesn't anymore' was checked out by Dr. Ron Parker late '90s, everything checked out ok and not had any skipping since (all per pt)   Essential hypertension, benign    Fatty liver    GERD (gastroesophageal reflux disease)    H/O hypercalcemia 04/12/2017   Hepatitis B    had at age 48, 'GI doc said it's gone away'   Hepatitis B surface antigen positive    High  cholesterol    History of hiatal hernia    Hypercalcemia    Hypothyroidism    Kidney stone 12/2012   Patient was seen at [facility]   Obesity    Osteoporosis    PONV (postoperative nausea and vomiting)    Type 2 diabetes mellitus ([place])     PAST SURGICAL HISTORY: Past Surgical History:  Procedure Laterality Date   ABDOMINAL HYSTERECTOMY  1981   APPENDECTOMY  1981   BILATERAL CARPAL TUNNEL RELEASE Bilateral    Dr. Marlou Sa at surgical center   BIOPSY  05/26/2017   Procedure: BIOPSY;  Surgeon: Rogene Houston, MD;  [facility];  Service: Endoscopy;;  colon   BIOPSY  10/11/2018   Procedure: BIOPSY;  Surgeon: Rogene Houston, MD;  [facility];  Service: Endoscopy;;   CATARACT EXTRACTION, BILATERAL     COLONOSCOPY N/A 05/26/2017   Procedure: COLONOSCOPY;  Surgeon: Rogene Houston, MD;  [facility];  Service: Endoscopy;  Laterality: N/A;  10:30   Deviated septum repair     ESOPHAGEAL DILATION N/A 08/16/2019   Procedure: ESOPHAGEAL DILATION;  Surgeon: Rogene Houston, MD;  [facility];  Service: Endoscopy;  Laterality: N/A;   ESOPHAGOGASTRODUODENOSCOPY N/A 07/28/2016   Procedure: ESOPHAGOGASTRODUODENOSCOPY (EGD);  Surgeon: Rogene Houston, MD;  [facility];  Service: Endoscopy;  Laterality: N/A;  300 - moved to 3/7 @ 1:00   ESOPHAGOGASTRODUODENOSCOPY N/A 08/16/2019   Procedure: ESOPHAGOGASTRODUODENOSCOPY (EGD);  Surgeon: Rogene Houston, MD;  [facility];  Service: Endoscopy;  Laterality: N/A;  [place] N/A 10/11/2018   Procedure: FLEXIBLE SIGMOIDOSCOPY;  Surgeon: Rogene Houston, MD;  [facility];  Service: Endoscopy;  Laterality: N/A;  unable to complete colonoscopy    IR CT HEAD LTD  03/07/2021   IR PERCUTANEOUS ART THROMBECTOMY/INFUSION INTRACRANIAL INC DIAG ANGIO  03/07/2021   IR US GUIDE VASC ACCESS RIGHT  03/07/2021   KNEE ARTHROPLASTY Left 09/15/2015    Procedure: COMPUTER ASSISTED TOTAL KNEE ARTHROPLASTY;  Surgeon: Marybelle Killings, MD;  Location: [place];  Service: Orthopedics;  Laterality: Left;   KNEE ARTHROSCOPY     left knee 2003   RADIOLOGY WITH ANESTHESIA N/A 03/07/2021   Procedure: IR WITH ANESTHESIA;  Surgeon: Corrie Mckusick, DO;  Location: [place];  Service: Anesthesiology;  Laterality: N/A;    FAMILY HISTORY: Family History  Problem Relation Age of Onset   COPD Mother    Heart attack Mother    Stroke Father  Hypertension Father    Hip fracture Father    Cancer Maternal Aunt    Diabetes Maternal Aunt    Diabetes Maternal Uncle    Colon cancer Neg Hx     SOCIAL HISTORY: Social History   Socioeconomic History   Marital status: Widowed    Spouse name: Not on file   Number of children: 1   Years of education: Not on file   Highest education level: High school graduate  Occupational History    Comment: retired  Tobacco Use   Smoking status: Never    Passive exposure: Past   Smokeless tobacco: Never  Vaping Use   Vaping Use: Never used  Substance and Sexual Activity   Alcohol use: No    Alcohol/week: 0.0 standard drinks   Drug use: No   Sexual activity: Never  Other Topics Concern   Not on file  Social History Narrative   07/04/19 Lives with son  10/22/21   Caffeine- coffee 1 c daily   Social Determinants of Health   Financial Resource Strain: Not on file  Food Insecurity: Not on file  Transportation Needs: Not on file  Physical Activity: Not on file  Stress: Not on file  Social Connections: Not on file  Intimate Partner Violence: Not on file     PHYSICAL EXAM  GENERAL EXAM/CONSTITUTIONAL: Vitals:  Vitals:   10/22/21 1350  BP: 116/72  Pulse: 63  Weight: 162 lb 12.8 oz (73.8 kg)  Height: '5\' 3"'$  (1.6 m)   Body mass index is 28.84 kg/m. Wt Readings from Last 3 Encounters:  10/22/21 162 lb 12.8 oz (73.8 kg)  08/04/21 155 lb (70.3 kg)  07/16/21 138 lb (62.6 kg)   Patient is in no distress; well  developed, nourished and groomed; neck is supple  CARDIOVASCULAR: Examination of carotid arteries is normal; no carotid bruits Regular rate and rhythm, no murmurs Examination of peripheral vascular system by observation and palpation is normal  EYES: Ophthalmoscopic exam of optic discs and posterior segments is normal; no papilledema or hemorrhages No results found.  MUSCULOSKELETAL: Gait, strength, tone, movements noted in Neurologic exam below  NEUROLOGIC: MENTAL STATUS:     09/27/2016   11:51 AM 09/11/2015    9:51 AM  MMSE - Mini Mental State Exam  Orientation to time 5 5  Orientation to Place 5 5  Registration 3 3  Attention/ Calculation 3 4  Recall 2 3  Language- name 2 objects 2 2  Language- repeat 1 1  Language- follow 3 step command 3 3  Language- read & follow direction 1 1  Write a sentence 1 1  Copy design 0 0  Total score 26 28   awake, alert, oriented to person recent and remote memory intact normal attention and concentration SLIGHTLY DECR FLUENCY; comprehension intact, naming intact fund of knowledge appropriate  CRANIAL NERVE:  2nd, 3rd, 4th, 6th - pupils --> RIGHT 2, LEFT 3; NR; DECR RIGHT VISUAL FIELD; extraocular muscles intact, no nystagmus 5th - facial sensation symmetric 7th - facial strength symmetric 8th - hearing intact 9th - palate elevates symmetrically, uvula midline 11th - shoulder shrug symmetric 12th - tongue protrusion midline  MOTOR:  normal bulk and tone, full strength in the BUE, BLE  SENSORY:  normal and symmetric to light touch, temperature, vibration  COORDINATION:  finger-nose-finger, fine finger movements normal  REFLEXES:  deep tendon reflexes TRACE and symmetric  GAIT/STATION:  narrow based gait     DIAGNOSTIC DATA (LABS, IMAGING, TESTING) -  I reviewed patient records, labs, notes, testing and imaging myself where available.  Lab Results  Component Value Date   WBC 8.6 03/12/2021   HGB 12.8 03/12/2021    HCT 38.9 03/12/2021   MCV 91.5 03/12/2021   PLT 270 03/12/2021      Component Value Date/Time   NA 140 03/12/2021 0150   NA 137 07/16/2019 1002   K 4.5 03/12/2021 0150   CL 111 03/12/2021 0150   CO2 19 (L) 03/12/2021 0150   GLUCOSE 138 (H) 03/12/2021 0150   BUN 29 (H) 03/12/2021 0150   BUN 19 07/16/2019 1002   CREATININE 1.99 (H) 03/12/2021 0150   CALCIUM 8.5 (L) 03/12/2021 0150   PROT 6.9 03/06/2021 2231   PROT 6.1 07/16/2019 1002   ALBUMIN 3.7 03/06/2021 2231   ALBUMIN 3.5 (L) 07/16/2019 1002   AST 19 03/06/2021 2231   ALT 13 03/06/2021 2231   ALKPHOS 66 03/06/2021 2231   BILITOT 0.3 03/06/2021 2231   BILITOT 0.2 07/16/2019 1002   GFRNONAA 24 (L) 03/12/2021 0150   GFRAA 38 (L) 07/16/2019 1002   Lab Results  Component Value Date   CHOL 219 (H) 03/11/2021   HDL 38 (L) 03/11/2021   LDLCALC 123 (H) 03/11/2021   LDLDIRECT 147.9 (H) 03/07/2021   TRIG 290 (H) 03/11/2021   CHOLHDL 5.8 03/11/2021   Lab Results  Component Value Date   HGBA1C 6.2 (H) 03/07/2021   Lab Results  Component Value Date   VITAMINB12 >2000 (H) 01/27/2017   Lab Results  Component Value Date   TSH 0.05 (L) 01/15/2021       ASSESSMENT AND PLAN  88 y.o. year old female here with:   Dx:  1. Chronic ischemic left MCA stroke   2. Type 2 diabetes mellitus with diabetic nephropathy, without long-term current use of insulin ([place])   3. Hyperlipidemia associated with type 2 diabetes mellitus (HCC)       PLAN:  Stroke: Left MCA stroke due to left M2 inferior division occlusion s/p IR, etiology likely large vessel disease, although cardioembolic source can not be completely ruled out Code Stroke CT head. No acute intracranial abnormality. ASPECTS is 10. CTA head & neck : Proximal left MCA M2/M3 segment occlusion with 4 mL left MCA territory core infarct and 54 mL ischemic penumbra. Cerebral angio left M2 occlusion with TICI 2c reperfusion, but M3 reoccluded Post IR CT : Concern for [facility] with  small volume MRI brain showed left MCA patchy scattered infarct moderate size, but no SAH seen on SWI.  MRA showed continued left M2 occlusion and b/l M2 stenosis.  2D Echo EF 60-65% cardiac event monitoring as outpt --> 15 day zio negative for afib Bilat LE [place] No evidence of DVT bilaterally LDL 123 HgbA1c 6.2 VTE prophylaxis - SCd's No antithrombotic prior to admission, continue aspirin '81mg'$  daily (completed aspirin + plavix x 3 months)   Hypertension Home meds:  atenolol, norvasc, irbesartan, losartan Stable Long-term BP goal normotensive   Hyperlipidemia currently on crestor 40   LDL 123, goal < 70 She needs close outpatient follow-up with her PCP for better HLD management.    Diabetes type II Controlled Continue metformin HgbA1c 6.2, goal < 7.0  Return for return to PCP, pending if symptoms worsen or fail to improve.    Penni Bombard, MD 09/25/6566, 1:27 PM Certified in Neurology, Neurophysiology and Neuroimaging  [facility] [address] [place], [place] [address] [phone]

## 2021-10-28 ENCOUNTER — Emergency Department (HOSPITAL_COMMUNITY): Payer: Medicare Other

## 2021-10-28 ENCOUNTER — Observation Stay (HOSPITAL_COMMUNITY): Payer: Medicare Other

## 2021-10-28 ENCOUNTER — Inpatient Hospital Stay (HOSPITAL_COMMUNITY)
Admission: EM | Admit: 2021-10-28 | Discharge: 2021-10-30 | DRG: 100 | Disposition: A | Discharge status: Home Health | Payer: Medicare Other | Attending: Internal Medicine | Admitting: Internal Medicine

## 2021-10-28 DIAGNOSIS — I69351 Hemiplegia and hemiparesis following cerebral infarction affecting right dominant side: Secondary | ICD-10-CM

## 2021-10-28 DIAGNOSIS — E7800 Pure hypercholesterolemia, unspecified: Secondary | ICD-10-CM | POA: Diagnosis present

## 2021-10-28 DIAGNOSIS — G40901 Epilepsy, unspecified, not intractable, with status epilepticus: Principal | ICD-10-CM

## 2021-10-28 DIAGNOSIS — R569 Unspecified convulsions: Secondary | ICD-10-CM

## 2021-10-28 DIAGNOSIS — R471 Dysarthria and anarthria: Secondary | ICD-10-CM | POA: Diagnosis present

## 2021-10-28 DIAGNOSIS — Z823 Family history of stroke: Secondary | ICD-10-CM

## 2021-10-28 DIAGNOSIS — E039 Hypothyroidism, unspecified: Secondary | ICD-10-CM | POA: Diagnosis present

## 2021-10-28 DIAGNOSIS — G40101 Localization-related (focal) (partial) symptomatic epilepsy and epileptic syndromes with simple partial seizures, not intractable, with status epilepticus: Secondary | ICD-10-CM | POA: Diagnosis not present

## 2021-10-28 DIAGNOSIS — N289 Disorder of kidney and ureter, unspecified: Secondary | ICD-10-CM

## 2021-10-28 DIAGNOSIS — Z7982 Long term (current) use of aspirin: Secondary | ICD-10-CM

## 2021-10-28 DIAGNOSIS — Z825 Family history of asthma and other chronic lower respiratory diseases: Secondary | ICD-10-CM

## 2021-10-28 DIAGNOSIS — I129 Hypertensive chronic kidney disease with stage 1 through stage 4 chronic kidney disease, or unspecified chronic kidney disease: Secondary | ICD-10-CM | POA: Diagnosis present

## 2021-10-28 DIAGNOSIS — M810 Age-related osteoporosis without current pathological fracture: Secondary | ICD-10-CM | POA: Diagnosis present

## 2021-10-28 DIAGNOSIS — Z1152 Encounter for screening for COVID-19: Secondary | ICD-10-CM

## 2021-10-28 DIAGNOSIS — Z87891 Personal history of nicotine dependence: Secondary | ICD-10-CM

## 2021-10-28 DIAGNOSIS — I69320 Aphasia following cerebral infarction: Secondary | ICD-10-CM

## 2021-10-28 DIAGNOSIS — I639 Cerebral infarction, unspecified: Secondary | ICD-10-CM | POA: Diagnosis present

## 2021-10-28 DIAGNOSIS — Z79890 Hormone replacement therapy (postmenopausal): Secondary | ICD-10-CM

## 2021-10-28 DIAGNOSIS — G40119 Localization-related (focal) (partial) symptomatic epilepsy and epileptic syndromes with simple partial seizures, intractable, without status epilepticus: Secondary | ICD-10-CM

## 2021-10-28 DIAGNOSIS — F419 Anxiety disorder, unspecified: Secondary | ICD-10-CM | POA: Diagnosis present

## 2021-10-28 DIAGNOSIS — E1122 Type 2 diabetes mellitus with diabetic chronic kidney disease: Secondary | ICD-10-CM | POA: Diagnosis present

## 2021-10-28 DIAGNOSIS — Z79899 Other long term (current) drug therapy: Secondary | ICD-10-CM

## 2021-10-28 DIAGNOSIS — R29716 NIHSS score 16: Secondary | ICD-10-CM | POA: Diagnosis present

## 2021-10-28 DIAGNOSIS — Z7984 Long term (current) use of oral hypoglycemic drugs: Secondary | ICD-10-CM

## 2021-10-28 DIAGNOSIS — N184 Chronic kidney disease, stage 4 (severe): Secondary | ICD-10-CM | POA: Diagnosis present

## 2021-10-28 DIAGNOSIS — Z888 Allergy status to other drugs, medicaments and biological substances status: Secondary | ICD-10-CM

## 2021-10-28 DIAGNOSIS — Z8249 Family history of ischemic heart disease and other diseases of the circulatory system: Secondary | ICD-10-CM

## 2021-10-28 DIAGNOSIS — R1310 Dysphagia, unspecified: Secondary | ICD-10-CM | POA: Diagnosis present

## 2021-10-28 DIAGNOSIS — I69398 Other sequelae of cerebral infarction: Secondary | ICD-10-CM

## 2021-10-28 DIAGNOSIS — H53461 Homonymous bilateral field defects, right side: Secondary | ICD-10-CM | POA: Diagnosis present

## 2021-10-28 DIAGNOSIS — K219 Gastro-esophageal reflux disease without esophagitis: Secondary | ICD-10-CM | POA: Diagnosis present

## 2021-10-28 DIAGNOSIS — Z833 Family history of diabetes mellitus: Secondary | ICD-10-CM

## 2021-10-28 DIAGNOSIS — Z87442 Personal history of urinary calculi: Secondary | ICD-10-CM

## 2021-10-28 LAB — I-STAT CHEM 8, ED
BUN: 33 mg/dL — ABNORMAL HIGH (ref 8–23)
Calcium, Ion: 1.07 mmol/L — ABNORMAL LOW (ref 1.15–1.40)
Chloride: 107 mmol/L (ref 98–111)
Creatinine, Ser: 2 mg/dL — ABNORMAL HIGH (ref 0.44–1.00)
Glucose, Bld: 141 mg/dL — ABNORMAL HIGH (ref 70–99)
HCT: 41 % (ref 36.0–46.0)
Hemoglobin: 13.9 g/dL (ref 12.0–15.0)
Potassium: 4.6 mmol/L (ref 3.5–5.1)
Sodium: 137 mmol/L (ref 135–145)
TCO2: 21 mmol/L — ABNORMAL LOW (ref 22–32)

## 2021-10-28 LAB — CBC
HCT: 41.9 % (ref 36.0–46.0)
Hemoglobin: 13.6 g/dL (ref 12.0–15.0)
MCH: 29.8 pg (ref 26.0–34.0)
MCHC: 32.5 g/dL (ref 30.0–36.0)
MCV: 91.9 fL (ref 80.0–100.0)
Platelets: 323 10*3/uL (ref 150–400)
RBC: 4.56 MIL/uL (ref 3.87–5.11)
RDW: 13.4 % (ref 11.5–15.5)
WBC: 10.3 10*3/uL (ref 4.0–10.5)
nRBC: 0 % (ref 0.0–0.2)

## 2021-10-28 LAB — COMPREHENSIVE METABOLIC PANEL
ALT: 13 U/L (ref 0–44)
AST: 19 U/L (ref 15–41)
Albumin: 3.8 g/dL (ref 3.5–5.0)
Alkaline Phosphatase: 102 U/L (ref 38–126)
Anion gap: 12 (ref 5–15)
BUN: 30 mg/dL — ABNORMAL HIGH (ref 8–23)
CO2: 19 mmol/L — ABNORMAL LOW (ref 22–32)
Calcium: 8.9 mg/dL (ref 8.9–10.3)
Chloride: 106 mmol/L (ref 98–111)
Creatinine, Ser: 1.88 mg/dL — ABNORMAL HIGH (ref 0.44–1.00)
GFR, Estimated: 26 mL/min — ABNORMAL LOW (ref >60)
Glucose, Bld: 145 mg/dL — ABNORMAL HIGH (ref 70–99)
Potassium: 4.4 mmol/L (ref 3.5–5.1)
Sodium: 137 mmol/L (ref 135–145)
Total Bilirubin: 0.5 mg/dL (ref 0.3–1.2)
Total Protein: 7 g/dL (ref 6.5–8.1)

## 2021-10-28 LAB — DIFFERENTIAL
Abs Immature Granulocytes: 0.24 10*3/uL — ABNORMAL HIGH (ref 0.00–0.07)
Basophils Absolute: 0.2 10*3/uL — ABNORMAL HIGH (ref 0.0–0.1)
Basophils Relative: 2 %
Eosinophils Absolute: 0 10*3/uL (ref 0.0–0.5)
Eosinophils Relative: 0 %
Immature Granulocytes: 2 %
Lymphocytes Relative: 30 %
Lymphs Abs: 3.1 10*3/uL (ref 0.7–4.0)
Monocytes Absolute: 1.1 10*3/uL — ABNORMAL HIGH (ref 0.1–1.0)
Monocytes Relative: 11 %
Neutro Abs: 5.7 10*3/uL (ref 1.7–7.7)
Neutrophils Relative %: 55 %

## 2021-10-28 LAB — HEMOGLOBIN A1C
Hgb A1c MFr Bld: 7.2 % — ABNORMAL HIGH (ref 4.8–5.6)
Mean Plasma Glucose: 159.94 mg/dL

## 2021-10-28 LAB — RESP PANEL BY RT-PCR (FLU A&B, COVID) ARPGX2
Influenza A by PCR: NEGATIVE
Influenza B by PCR: NEGATIVE
SARS Coronavirus 2 by RT PCR: NEGATIVE

## 2021-10-28 LAB — CBG MONITORING, ED: Glucose-Capillary: 218 mg/dL — ABNORMAL HIGH (ref 70–99)

## 2021-10-28 LAB — PROTIME-INR
INR: 1 (ref 0.8–1.2)
Prothrombin Time: 13.1 seconds (ref 11.4–15.2)

## 2021-10-28 LAB — APTT: aPTT: 33 seconds (ref 24–36)

## 2021-10-28 LAB — ETHANOL: Alcohol, Ethyl (B): <10 mg/dL (ref <10)

## 2021-10-28 MED ORDER — SODIUM CHLORIDE 0.9 % IV SOLN: INTRAVENOUS | Status: DC

## 2021-10-28 MED ORDER — LORAZEPAM 2 MG/ML IJ SOLN: 4.0000 mg | INTRAMUSCULAR | Status: DC | PRN

## 2021-10-28 MED ORDER — ATENOLOL 50 MG PO TABS
25.0000 mg | ORAL_TABLET | Freq: Every day | ORAL | Status: DC
  Administered 2021-10-29 – 2021-10-30 (×2): 25 mg via ORAL
  Filled 2021-10-28 (×2): qty 1

## 2021-10-28 MED ORDER — LEVETIRACETAM IN NACL 500 MG/100ML IV SOLN
500.0000 mg | Freq: Two times a day (BID) | INTRAVENOUS | Status: DC
  Administered 2021-10-29: 500 mg via INTRAVENOUS
  Filled 2021-10-28: qty 100

## 2021-10-28 MED ORDER — AMLODIPINE BESYLATE 10 MG PO TABS
10.0000 mg | ORAL_TABLET | Freq: Every day | ORAL | Status: DC
  Administered 2021-10-29 – 2021-10-30 (×2): 10 mg via ORAL
  Filled 2021-10-28: qty 2
  Filled 2021-10-28: qty 1

## 2021-10-28 MED ORDER — POLYETHYLENE GLYCOL 3350 17 G PO PACK
17.0000 g | PACK | Freq: Every day | ORAL | Status: DC
  Administered 2021-10-29 – 2021-10-30 (×2): 17 g via ORAL
  Filled 2021-10-28 (×2): qty 1

## 2021-10-28 MED ORDER — LOSARTAN POTASSIUM 50 MG PO TABS
100.0000 mg | ORAL_TABLET | Freq: Every day | ORAL | Status: DC
  Administered 2021-10-29 – 2021-10-30 (×2): 100 mg via ORAL
  Filled 2021-10-28 (×2): qty 2

## 2021-10-28 MED ORDER — LORAZEPAM 2 MG/ML IJ SOLN: 2.0000 mg | Freq: Once | INTRAMUSCULAR | Status: AC

## 2021-10-28 MED ORDER — IOHEXOL 350 MG/ML SOLN
100.0000 mL | Freq: Once | INTRAVENOUS | Status: AC | PRN
  Administered 2021-10-28: 100 mL via INTRAVENOUS

## 2021-10-28 MED ORDER — LEVETIRACETAM IN NACL 1000 MG/100ML IV SOLN
1000.0000 mg | Freq: Once | INTRAVENOUS | Status: AC
  Administered 2021-10-28: 1000 mg via INTRAVENOUS

## 2021-10-28 MED ORDER — ENOXAPARIN SODIUM 30 MG/0.3ML IJ SOSY
30.0000 mg | PREFILLED_SYRINGE | INTRAMUSCULAR | Status: DC
  Administered 2021-10-29: 30 mg via SUBCUTANEOUS
  Filled 2021-10-28: qty 0.3

## 2021-10-28 MED ORDER — CHLORHEXIDINE GLUCONATE 0.12% ORAL RINSE (MEDLINE KIT)
15.0000 mL | Freq: Two times a day (BID) | OROMUCOSAL | Status: DC
  Administered 2021-10-30: 15 mL via OROMUCOSAL

## 2021-10-28 MED ORDER — PANTOPRAZOLE SODIUM 40 MG PO TBEC
40.0000 mg | DELAYED_RELEASE_TABLET | Freq: Every day | ORAL | Status: DC
  Administered 2021-10-29 – 2021-10-30 (×2): 40 mg via ORAL
  Filled 2021-10-28 (×2): qty 1

## 2021-10-28 MED ORDER — ASPIRIN 81 MG PO CHEW
81.0000 mg | CHEWABLE_TABLET | Freq: Every day | ORAL | Status: DC
  Administered 2021-10-29 – 2021-10-30 (×2): 81 mg via ORAL
  Filled 2021-10-28 (×2): qty 1

## 2021-10-28 MED ORDER — LORAZEPAM 2 MG/ML IJ SOLN
INTRAMUSCULAR | Status: AC
  Administered 2021-10-28: 2 mg
  Filled 2021-10-28: qty 1

## 2021-10-28 MED ORDER — POLYETHYLENE GLYCOL 3350 17 G PO PACK: 17.0000 g | PACK | Freq: Every day | ORAL | Status: DC | PRN

## 2021-10-28 MED ORDER — ORAL CARE MOUTH RINSE
15.0000 mL | OROMUCOSAL | Status: DC
  Administered 2021-10-29 – 2021-10-30 (×7): 15 mL via OROMUCOSAL

## 2021-10-28 MED ORDER — LEVOTHYROXINE SODIUM 25 MCG PO TABS
125.0000 ug | ORAL_TABLET | Freq: Every day | ORAL | Status: DC
  Administered 2021-10-29 – 2021-10-30 (×2): 125 ug via ORAL
  Filled 2021-10-28 (×2): qty 1

## 2021-10-28 MED ORDER — ROSUVASTATIN CALCIUM 20 MG PO TABS
40.0000 mg | ORAL_TABLET | Freq: Every day | ORAL | Status: DC
  Administered 2021-10-29: 40 mg via ORAL
  Filled 2021-10-28 (×2): qty 2

## 2021-10-28 MED ORDER — INSULIN ASPART 100 UNIT/ML IJ SOLN
0.0000 [IU] | Freq: Three times a day (TID) | INTRAMUSCULAR | Status: DC
  Administered 2021-10-29 (×2): 3 [IU] via SUBCUTANEOUS
  Administered 2021-10-30: 8 [IU] via SUBCUTANEOUS
  Administered 2021-10-30: 3 [IU] via SUBCUTANEOUS

## 2021-10-28 NOTE — ED Provider Triage Note (Signed)
Emergency Medicine Provider Triage Evaluation Note  DEMIRA GWYNNE , a 88 y.o. female  was evaluated in triage.  Pt complains of slurred speech. LNK 3:00 PM.   Review of Systems    Physical Exam  BP (!) 161/88   Pulse 81   Temp 97.7 F (36.5 C) (Oral)   Resp (!) 22   SpO2 97%  Gen:   Awake, no distress   Resp:  Normal effort  MSK:   Moves extremities without difficulty  Other:  Slurred speech, will not follow commands.  Disoriented  Medical Decision Making  Medically screening exam initiated at 4:01 PM.  Appropriate orders placed.  Edna J Soltis was informed that the remainder of the evaluation will be completed by another provider, this initial triage assessment does not replace that evaluation, and the importance of remaining in the ED until their evaluation is complete.  Code stroke initiated   Sherrill Raring, [place] 10/28/21 1601

## 2021-10-28 NOTE — Procedures (Addendum)
Patient Name: Lisa Sutton  MRN: [id number]  Epilepsy Attending: Lora Havens  Referring Physician/Provider: Janine Ores, NP Date: 10/28/2021 Duration: 23.01 mins  Patient history: 88yo F presented with sudden onset of right leg twitching's initially subsequently involving the right face as well and was called a code stroke on exam she was clearly having focal seizures continuously. EEG to evaluate for seizure  Level of alertness:  lethargic   AEDs during EEG study: LEV, Ativan  Technical aspects: This EEG study was done with scalp electrodes positioned according to the 10-20 International system of electrode placement. Electrical activity was acquired at a sampling rate of '500Hz'$  and reviewed with a high frequency filter of '70Hz'$  and a low frequency filter of '1Hz'$ . EEG data were recorded continuously and digitally stored.   Description: No posterior dominant rhythm was seen. EEG showed continuous generalized and maximal left temporal 3 to 6 Hz theta-delta slowing admixed with an excessive amount of 15 to 18 Hz beta activity distributed symmetrically and diffusely. Spike was noted in left temporal  region. Hyperventilation and photic stimulation were not performed.     ABNORMALITY - Spike, left temporal region.  - Continuous slow, generalized and maximal left temporal region - Excessive beta, generalized   IMPRESSION: This study showed evidence of epileptogenicity and cortical dysfunction arising from left temporal region likely due to underlying stroke.  Additionally there is moderate to severe diffuse encephalopathy, nonspecific etiology. The excessive beta activity is likely due to benzodiapine use and is a benign EEG pattern. No seizures were seen throughout the recording.  Dr Curly Shores was notified.   [name] [name]

## 2021-10-28 NOTE — Progress Notes (Signed)
STAT EEG complete - results pending. ? ?

## 2021-10-28 NOTE — Code Documentation (Signed)
Stroke Response Nurse Documentation Code Documentation  Lisa Sutton is a 88 y.o. female arriving to [facility]  via [employer] on 10/28/2021 with past medical hx of stroke. Code stroke was activated by ED.   Patient from car [employer] where she was with her son. Pt reports feeling weak and not able to fully walk around 3p. Son helped her to the care and then noted that she started to have slurred speech and worsened to be unable to speak. Son called EMS and told them he was on his way. Pt arrived at the ED and had to be helped out of the car by staff. Code Stroke called by ED after she was noted to have right sided weakness and aphasia.   In CT, pt was noted to be actively seizing with twitching of the face and leg. 2 mg of Ativan given while in CT. See MAR. Pt continued to be seizing. Patient taken back to ED and loaded with 2,000 mg of Keppra. Seizing stopped and patient noted to be stable. Pt placed on cardiac monitor.  At this time, pt returned to CT for CTA/CTP completed.    Stroke team at the bedside on patient arrival. Labs drawn and patient cleared for CT by [facility]. Patient to CT with team. NIHSS 16, see documentation for details and code stroke times. Patient with disoriented, right hemianopia, right facial droop, right arm weakness, right leg weakness, right decreased sensation, Expressive aphasia , and dysarthria  on exam. The following imaging was completed:  CT Head, CTA, and CTP. Patient not a candidate for thrombolytic due to stroke not suspected per MD. No LVO noted on imaging per MD Leonie Man for IR.    Care Plan: Seizure precautions, q2 NIHSS/VS.   Bedside handoff with ED RN Josh.    Kathrin Greathouse  Stroke Response RN

## 2021-10-28 NOTE — ED Notes (Signed)
Eeg tech in room

## 2021-10-28 NOTE — H&P (Signed)
Date: 10/28/2021               Patient Name:  Lisa Sutton MRN: [id number]  DOB: 08-Oct-1933 Age / Sex: 88 y.o., female   PCP: Pcp, No         Medical Service: Internal Medicine Teaching Service         Attending Physician: Dr. Sid Falcon, MD    First Contact: Christiana Fuchs, DO Pager: CL [phone]  Second Contact: Hadassah Pais, MD Pager: PB [phone]       After Hours (After 5p/  First Contact Pager: [phone]  weekends / holidays): Second Contact Pager: [phone]   SUBJECTIVE  Chief Complaint: right sided weakness  History of Present Illness: Lisa Sutton is a 88 y.o. female with a pertinent PMH of CVA with residual right side weakness, hypertension, hyperlipidemia, well-controlled type 2 diabetes, who presents to [facility] with right sided weakness and aphasia.  Patient states that she woke up and felt baseline this morning.  She completed normal activities today.  She decided to ride with her son to get her because we will change.  She recalls that her right arm started to feel numb.  Her son reports that while they were sitting in the waiting room at the dealership she began to complain of right arm numbness that began to worsen with shaking of her right leg and worsening of her speech. He states that she was no longer able to ambulate to the car on her own due to symptoms.  He then drove her to the emergency room as she declined EMS being called.  In the ED, she was unresponsive. Code stroke was activated and patient was evaluated by neurology. Right face twitching and right leg twitching was noted on exam. She was given 2 mg ativan IV and seizure activity resolved.    Medications: No current facility-administered medications on file prior to encounter.   Current Outpatient Medications on File Prior to Encounter  Medication Sig Dispense Refill   ACCU-CHEK GUIDE test strip 3 (three) times daily.     ALPRAZolam (XANAX) 0.25 MG tablet 1 tablet as needed     amLODipine  (NORVASC) 10 MG tablet Take 10 mg by mouth daily.     aspirin EC 81 MG tablet Take 81 mg by mouth daily. Swallow whole.     atenolol (TENORMIN) 25 MG tablet Take 1 tablet (25 mg total) by mouth daily. 30 tablet 0   diphenhydrAMINE (BENADRYL) 25 MG tablet Take 1 tablet (25 mg total) by mouth every 6 (six) hours as needed. 20 tablet 0   DULoxetine (CYMBALTA) 30 MG capsule Take 1 capsule (30 mg total) by mouth daily. (Patient taking differently: Take 30 mg by mouth at bedtime.) 90 capsule 0   hydrOXYzine (ATARAX) 50 MG tablet Take 50 mg by mouth at bedtime as needed.     irbesartan (AVAPRO) 150 MG tablet Take 1 tablet (150 mg total) by mouth daily. 60 tablet 0   levothyroxine (SYNTHROID) 125 MCG tablet Take 1 tablet (125 mcg total) by mouth daily before breakfast. 90 tablet 3   losartan (COZAAR) 100 MG tablet Take 100 mg by mouth daily.     meclizine (ANTIVERT) 25 MG tablet SMARTSIG:1 Tablet(s) By Mouth Every 12 Hours PRN     metFORMIN (GLUCOPHAGE-XR) 500 MG 24 hr tablet Take 500 mg by mouth daily.     ondansetron (ZOFRAN) 4 MG tablet Take 4 mg by mouth every 8 (eight) hours as needed.  pantoprazole (PROTONIX) 40 MG tablet Take 1 tablet (40 mg total) by mouth daily before lunch. 90 tablet 3   rosuvastatin (CRESTOR) 40 MG tablet Take 40 mg by mouth at bedtime.     triamcinolone cream (KENALOG) 0.1 % Apply topically.     [DISCONTINUED] calcium citrate-vitamin D 200-200 MG-UNIT TABS Take 1 tablet by mouth daily.        Past Medical History:  Past Medical History:  Diagnosis Date   Anxiety    Arthritis    Cataract    CVA (cerebral vascular accident) ([place]) 03/06/2021   Dizziness    Dysphagia    'sometimes but not a major issue' been checked out by GI (per pt)   Dysrhythmia    'heart used to skip but doesn't anymore' was checked out by Dr. Ron Parker late '90s, everything checked out ok and not had any skipping since (all per pt)   Essential hypertension, benign    Fatty liver    GERD  (gastroesophageal reflux disease)    H/O hypercalcemia 04/12/2017   Hepatitis B    had at age 71, 'GI doc said it's gone away'   Hepatitis B surface antigen positive    High cholesterol    History of hiatal hernia    Hypercalcemia    Hypothyroidism    Kidney stone 12/2012   Patient was seen at [facility]   Obesity    Osteoporosis    PONV (postoperative nausea and vomiting)    Type 2 diabetes mellitus ([place])     Social:  Lives -with son, Joey Level of function -independent in ADLs, truly helps with IADLs including giving her medications.  She does not need assistive devices to ambulate. PCP -[facility], establishing with new provider next month Substance use -previously smoked tobacco products greater than 60 years ago, unsure of amount smoked.  Does not drink alcohol or use other drugs.  Family History: Family History  Problem Relation Age of Onset   COPD Mother    Heart attack Mother    Stroke Father    Hypertension Father    Hip fracture Father    Cancer Maternal Aunt    Diabetes Maternal Aunt    Diabetes Maternal Uncle    Colon cancer Neg Hx     Allergies: Allergies as of 10/28/2021 - Review Complete 10/22/2021  Allergen Reaction Noted   Statins Other (See Comments) 09/03/2015   Other  08/04/2021   Zetia [ezetimibe] Other (See Comments) 11/13/2012   Zocor [simvastatin] Other (See Comments) 11/13/2012    Review of Systems: A complete ROS was negative except as per HPI.   OBJECTIVE:  Physical Exam: Blood pressure 124/60, pulse 66, temperature 97.7 F (36.5 C), temperature source Oral, resp. rate 16, SpO2 95 %. Constitutional: awake, in no acute distress Eyes: OS phakolens Cardiovascular: regular rhythm, no m/r/g Pulmonary/Chest: normal work of breathing on room air, no wheezing or rhonchi present on exam Mental Status: Patient is awake, alert, oriented x3  No signs of aphasia or neglect Cranial Nerves: II: Pupils equal, round, and reactive to light.    III,IV, VI: EOMI without ptosis or diploplia.  V: Facial sensation is symmetric to light touch and temperature. VII: Facial movement is symmetric.  VIII: Hearing is intact to voice X: Uvula elevates symmetrically XI: Shoulder shrug is symmetric. XII: Tongue is midline without atrophy or fasciculations.  Motor: effort thorughout, at least 5/5 bilateral UE, 5/5 in left lower extremity and 4/5 in right lower extremity Sensory: Sensation is grossly  intact, bilateral UE & LE  Pertinent Labs: CBC    Component Value Date/Time   WBC 10.3 10/28/2021 1600   RBC 4.56 10/28/2021 1600   HGB 13.9 10/28/2021 1616   HGB 14.7 07/16/2019 1002   HCT 41.0 10/28/2021 1616   HCT 42.5 07/16/2019 1002   PLT 323 10/28/2021 1600   PLT 203 07/16/2019 1002   MCV 91.9 10/28/2021 1600   MCV 91 07/16/2019 1002   MCH 29.8 10/28/2021 1600   MCHC 32.5 10/28/2021 1600   RDW 13.4 10/28/2021 1600   RDW 13.3 07/16/2019 1002   LYMPHSABS 3.1 10/28/2021 1600   LYMPHSABS 2.6 07/16/2019 1002   MONOABS 1.1 (H) 10/28/2021 1600   EOSABS 0.0 10/28/2021 1600   EOSABS 0.2 07/16/2019 1002   BASOSABS 0.2 (H) 10/28/2021 1600   BASOSABS 0.1 07/16/2019 1002     CMP     Component Value Date/Time   NA 137 10/28/2021 1616   NA 137 07/16/2019 1002   K 4.6 10/28/2021 1616   CL 107 10/28/2021 1616   CO2 19 (L) 10/28/2021 1600   GLUCOSE 141 (H) 10/28/2021 1616   BUN 33 (H) 10/28/2021 1616   BUN 19 07/16/2019 1002   CREATININE 2.00 (H) 10/28/2021 1616   CALCIUM 8.9 10/28/2021 1600   PROT 7.0 10/28/2021 1600   PROT 6.1 07/16/2019 1002   ALBUMIN 3.8 10/28/2021 1600   ALBUMIN 3.5 (L) 07/16/2019 1002   AST 19 10/28/2021 1600   ALT 13 10/28/2021 1600   ALKPHOS 102 10/28/2021 1600   BILITOT 0.5 10/28/2021 1600   BILITOT 0.2 07/16/2019 1002   GFRNONAA 26 (L) 10/28/2021 1600   GFRAA 38 (L) 07/16/2019 1002    Pertinent Imaging: CT HEAD CODE STROKE WO CONTRAST  Result Date: 10/28/2021 CLINICAL DATA:  Code stroke. Neuro  deficit, acute, stroke suspected. Right-sided numbness. EXAM: CT HEAD WITHOUT CONTRAST TECHNIQUE: Contiguous axial images were obtained from the base of the skull through the vertex without intravenous contrast. RADIATION DOSE REDUCTION: This exam was performed according to the departmental dose-optimization program which includes automated exposure control, adjustment of the mA and/or kV according to patient size and/or use of iterative reconstruction technique. COMPARISON:  CT head without contrast 03/07/2021. MR head without contrast 03/08/2021 FINDINGS: Brain: Remote left parietal infarct noted. Moderate white matter disease is present bilaterally. Basal ganglia are intact. No other acute cortical abnormality is present. No acute infarct, hemorrhage, or mass lesion is present. Ventricles are of normal size. No significant extraaxial fluid collection is present. The brainstem and cerebellum are within normal limits. Vascular: Atherosclerotic calcifications are present within the cavernous internal carotid arteries and at the dural margin of both vertebral arteries. No hyperdense vessel is present. Skull: Calvarium is intact. No focal lytic or blastic lesions are present. No significant extracranial soft tissue lesion is present. Sinuses/Orbits: Chronic soft tissue thickening is present the inferior right maxillary sinus. The paranasal sinuses and mastoid air cells are otherwise clear. Bilateral lens replacements are noted. Globes and orbits are otherwise unremarkable. Other: ASPECTS ([place] Stroke Program Early CT Score) - Ganglionic level infarction (caudate, lentiform nuclei, internal capsule, insula, M1-M3 cortex): 7/7 - Supraganglionic infarction (M4-M6 cortex): 3/3 Total score (0-10 with 10 being normal): 10/10 IMPRESSION: 1. No acute intracranial abnormality. 2. Expected evolution of left parietal infarct. 3. Moderate white matter disease likely reflects the sequela of chronic microvascular ischemia.  Electronically Signed   By: San Morelle M.D.   On: 10/28/2021 16:18   CT ANGIO HEAD NECK W  WO CM W PERF (CODE STROKE)  Result Date: 10/28/2021 CLINICAL DATA:  Stroke, follow up EXAM: CT ANGIOGRAPHY HEAD AND NECK CT PERFUSION BRAIN TECHNIQUE: Multidetector CT imaging of the head and neck was performed using the standard protocol during bolus administration of intravenous contrast. Multiplanar CT image reconstructions and MIPs were obtained to evaluate the vascular anatomy. Carotid stenosis measurements (when applicable) are obtained utilizing NASCET criteria, using the distal internal carotid diameter as the denominator. Multiphase CT imaging of the brain was performed following IV bolus contrast injection. Subsequent parametric perfusion maps were calculated using RAPID software. RADIATION DOSE REDUCTION: This exam was performed according to the departmental dose-optimization program which includes automated exposure control, adjustment of the mA and/or kV according to patient size and/or use of iterative reconstruction technique. CONTRAST:  1422m OMNIPAQUE IOHEXOL 350 MG/ML SOLN COMPARISON:  October 2022 FINDINGS: CTA NECK Aortic arch: Mild mixed plaque. Great vessel origins are patent. Mixed plaque along the proximal left subclavian with less than 50% stenosis. Right carotid system: Patent.  No stenosis. Left carotid system: Patent. Minimal calcified plaque at the bifurcation. No stenosis. Vertebral arteries: Patent. Mixed plaque causing marked stenosis of the left vertebral origin without apparent flow limitation. Skeleton: Cervical spine degenerative changes. Other neck: Unremarkable. Upper chest: Emphysema. Review of the MIP images confirms the above findings CTA HEAD Anterior circulation: Intracranial internal carotid arteries are patent with calcified plaque causing up to mild stenosis. Anterior and middle cerebral arteries are patent. There is atherosclerotic irregularity and mild stenoses.  Posterior circulation: Intracranial vertebral arteries are patent. Noncalcified plaque on the left causes mild stenosis. Basilar artery is patent. Major cerebellar artery origins are patent. Posterior cerebral arteries are patent. There is atherosclerotic irregularity including moderate stenosis of the right P2 PCA. Venous sinuses: Patent as allowed by contrast bolus timing. Review of the MIP images confirms the above findings CT Brain Perfusion Findings: CBF (<30%) Volume: 068mPerfusion (Tmax>6.0s) volume: 22m2mismatch Volume: 22mL32mfarction Location: None. IMPRESSION: No large vessel occlusion. Perfusion imaging demonstrates no evidence of core infarction or penumbra, but there is exclusion of a portion of the left cerebral hemisphere from the data. Marked stenosis of the left vertebral origin without apparent flow limitation. Similar intracranial atherosclerosis. Electronically Signed   By: PranMacy Mis.   On: 10/28/2021 17:13    EKG: personally reviewed my interpretation is sinus rhythm with first degree AV block PR at 203,  ASSESSMENT & PLAN:  Assessment: Principal Problem:   New onset seizure (HCC)[place] is a 87 y46. with pertinent PMH of CVA with residual right side weakness, hypertension, hyperlipidemia, well-controlled type 2 diabetes, who presented with right sided numbness and weakness and admit for status epilepticus on hospital day 0  Plan: New onset seizure Prior left MCA stroke s/p mechanical thrombectomy with residual r homonymous hemianopsia and right hand weakness Patient presented with right arm twitching of right leg, and dysarthria.  Code stroke was activated and was evaluated by neurologist concerning for seizure activity with focal status epilepticus.  She was given multiple doses of Ativan with resolution of most symptoms.  CT angio head and neck showed no large vessel occlusion or perfusion defect.  Appreciate neurology recommendations.  We will continue  Keppra and have overnight long-term EEG monitoring.  Plans are to obtain MRI when she is stable.  Likely that etiology of seizures is secondary to prior stroke. No signs concerning for infection.  -continue keppra 500 mg IV BID -Seizure precautions -bedside  swallow eval -overnight EEG -MRI when able -telemetry -check UDS -PT/OT eval -ASA 81 mg  Well-controlled Type II diabetes mellitus Last hemoglobin A1c was on October 2022 at 6.2.  She does not take medications for her diabetes and this is managed through diet. -SSI  Hyperlipidemia Medications include rosuvastatin 40 mg.  Lipid profile from October 2022 showed total cholesterol of 219 with LDL of 123.  -lipid panel pending  Hypertension Initially hypertensive on arrival at 160/89. -continue amlodipine 10 mg qd -continue atenolol 25 mg  History of dysphagia Followed with GI in the past for esophageal stricture that was dilated in March 2021. Bedside swallow completed. Patient has previously worked with SLP for language issues but son states that she does not have problems with swallowing foods at home. -CM diet  Hypothyroidism Home medication includes levothyroxine 125 mcg.  -continue levothyroxine -TSH in AM  Best Practice: Diet: Diabetic diet VTE: enoxaparin (LOVENOX) injection 30 mg Start: 10/28/21 1830 Code: Full Status: Observation with expected length of stay less than 2 midnights. Anticipated Discharge Location: Home Barriers to Discharge: Medical stability  Signature: Daleen Bo. Pinkie Manger, D.O.  Internal Medicine Resident, PGY-1 [facility]  Pager: [phone] 6:35 PM, 10/28/2021   Please contact the on call pager after 5 pm and on weekends at [phone].

## 2021-10-28 NOTE — ED Notes (Signed)
The pt has diddiculty following directions she is very hard of hearing  also

## 2021-10-28 NOTE — ED Triage Notes (Signed)
PT came in POV.  Per triage RN pt LKW 1500.  PResents with full right side weakness and aphasia.  Hx of previous stroke with right side vision loss and right hand weakness.

## 2021-10-28 NOTE — Progress Notes (Signed)
Patient going back to CT at this time so patient unavailable for EEG. Will attempt when patient is available.

## 2021-10-28 NOTE — Progress Notes (Signed)
LTM EEG hooked up and running - no initial skin breakdown - push button tested - neuro notified.  

## 2021-10-28 NOTE — ED Provider Notes (Signed)
, [facility] EMERGENCY DEPARTMENT Provider Note   CSN: [id number] Arrival date & time: 10/28/21  1548  An emergency department physician performed an initial assessment on this suspected stroke patient at 0359.  History  No chief complaint on file.   Lisa Sutton is a 88 y.o. female.  HPI  This patient is a very pleasant 88 year old female, she has a history of hypertension on amlodipine and atenolol and losartan.  She has a history of high cholesterol on a statin and has a history of diabetes on metformin.  She has what appears to be some slurred speech and numbness that could be consistent with a code stroke and she arrived by private vehicle, code stroke was initiated by the physician assistant immediately upon arrival when she goes to CT scan.  I saw the patient when she returned from CT scan  According to the patient's son they were at the cardio ship getting an oil change, when they got into the car the patient started to have some difficulty with speech then noted some jerking she had was making a strange sound with her mouth, he brought her straight to the hospital instead.  On arrival the patient was having seizure activity, unresponsive, brought to the CT scanner as a code stroke.  Neurology is evaluating patient on arrival.  Home Medications Prior to Admission medications   Medication Sig Start Date End Date Taking? Authorizing Provider  ACCU-CHEK GUIDE test strip 3 (three) times daily. 10/09/21   [provider]  ALPRAZolam Duanne Moron) 0.25 MG tablet 1 tablet as needed 08/11/19   [provider]  amLODipine (NORVASC) 10 MG tablet Take 10 mg by mouth daily. 12/09/20   [provider]  aspirin EC 81 MG tablet Take 81 mg by mouth daily. Swallow whole.    [provider]  atenolol (TENORMIN) 25 MG tablet Take 1 tablet (25 mg total) by mouth daily. 03/13/21   Dennison Mascot, PA-C  diphenhydrAMINE (BENADRYL) 25 MG tablet Take 1  tablet (25 mg total) by mouth every 6 (six) hours as needed. 07/17/21   Maudie Flakes, MD  DULoxetine (CYMBALTA) 30 MG capsule Take 1 capsule (30 mg total) by mouth daily. Patient taking differently: Take 30 mg by mouth at bedtime. 05/23/19   Baruch Gouty, FNP  hydrOXYzine (ATARAX) 50 MG tablet Take 50 mg by mouth at bedtime as needed. 07/20/21   [provider]  irbesartan (AVAPRO) 150 MG tablet Take 1 tablet (150 mg total) by mouth daily. 03/13/21   Dennison Mascot, PA-C  levothyroxine (SYNTHROID) 125 MCG tablet Take 1 tablet (125 mcg total) by mouth daily before breakfast. 01/16/21   Philemon Kingdom, MD  losartan (COZAAR) 100 MG tablet Take 100 mg by mouth daily. 10/01/21   [provider]  meclizine (ANTIVERT) 25 MG tablet SMARTSIG:1 Tablet(s) By Mouth Every 12 Hours PRN 09/11/21   [provider]  metFORMIN (GLUCOPHAGE-XR) 500 MG 24 hr tablet Take 500 mg by mouth daily. 10/15/21   [provider]  ondansetron (ZOFRAN) 4 MG tablet Take 4 mg by mouth every 8 (eight) hours as needed. 07/16/21   [provider]  pantoprazole (PROTONIX) 40 MG tablet Take 1 tablet (40 mg total) by mouth daily before lunch. 08/05/20   Rehman, Mechele Dawley, MD  rosuvastatin (CRESTOR) 40 MG tablet Take 40 mg by mouth at bedtime. 10/02/21   [provider]  triamcinolone cream (KENALOG) 0.1 % Apply topically. 07/16/21   [provider]  calcium citrate-vitamin D 200-200 MG-UNIT TABS Take 1 tablet by mouth daily.    08/10/11  [provider]      Allergies    Statins, Other, Zetia [ezetimibe], and Zocor [simvastatin]    Review of Systems   Review of Systems  All other systems reviewed and are negative.  Physical Exam Updated Vital Signs BP 124/60   Pulse 66   Temp 97.7 F (36.5 C) (Oral)   Resp 16   SpO2 95%  Physical Exam Vitals and nursing note reviewed.  Constitutional:      General: She is not in acute distress.    Appearance: She is  well-developed.  HENT:     Head: Normocephalic and atraumatic.     Mouth/Throat:     Pharynx: No oropharyngeal exudate.     Comments: Mild bruising on the tip of the tongue Eyes:     General: No scleral icterus.       Right eye: No discharge.        Left eye: No discharge.     Conjunctiva/sclera: Conjunctivae normal.     Pupils: Pupils are equal, round, and reactive to light.  Neck:     Thyroid: No thyromegaly.     Vascular: No JVD.  Cardiovascular:     Rate and Rhythm: Normal rate and regular rhythm.     Heart sounds: Normal heart sounds. No murmur heard.   No friction rub. No gallop.  Pulmonary:     Effort: Pulmonary effort is normal. No respiratory distress.     Breath sounds: Normal breath sounds. No wheezing or rales.  Abdominal:     General: Bowel sounds are normal. There is no distension.     Palpations: Abdomen is soft. There is no mass.     Tenderness: There is no abdominal tenderness.  Musculoskeletal:        General: No tenderness. Normal range of motion.     Cervical back: Normal range of motion and neck supple.  Lymphadenopathy:     Cervical: No cervical adenopathy.  Skin:    General: Skin is warm and dry.     Findings: No erythema or rash.  Neurological:     Mental Status: She is alert.     Coordination: Coordination normal.     Comments: The patient is awake, alert, able to raise both her left arm and left leg, she has some difficulty using the right side of her upper extremity which has some flexion inability to follow commands but the right leg is not moving, she does not have any obvious facial droop, still has some mild residual twitching of the right side of her face, no speech at this time, doing her best to follow commands  Psychiatric:        Behavior: Behavior normal.    ED Results / Procedures / Treatments   Labs (all labs ordered are listed, but only abnormal results are displayed) Labs Reviewed  DIFFERENTIAL - Abnormal; Notable for the  following components:      Result Value   Monocytes Absolute 1.1 (*)    Basophils Absolute 0.2 (*)    Abs Immature Granulocytes 0.24 (*)    All other components within normal limits  COMPREHENSIVE METABOLIC PANEL - Abnormal; Notable for the following components:   CO2 19 (*)    Glucose, Bld 145 (*)    BUN 30 (*)    Creatinine, Ser 1.88 (*)    GFR, Estimated 26 (*)  All other components within normal limits  I-STAT CHEM 8, ED - Abnormal; Notable for the following components:   BUN 33 (*)    Creatinine, Ser 2.00 (*)    Glucose, Bld 141 (*)    Calcium, Ion 1.07 (*)    TCO2 21 (*)    All other components within normal limits  RESP PANEL BY RT-PCR (FLU A&B, COVID) ARPGX2  ETHANOL  PROTIME-INR  APTT  CBC  RAPID URINE DRUG SCREEN, HOSP PERFORMED  URINALYSIS, ROUTINE W REFLEX MICROSCOPIC    EKG EKG Interpretation  Date/Time:  Wednesday October 28 2021 16:26:33 EDT Ventricular Rate:  67 PR Interval:  203 QRS Duration: 80 QT Interval:  399 QTC Calculation: 422 R Axis:   66 Text Interpretation: Sinus rhythm unremarkable EKG First degree A-V block compared with 10/22, no significant changes Confirmed by Noemi Chapel [phone]) on 10/28/2021 5:23:51 PM  Radiology CT HEAD CODE STROKE WO CONTRAST  Result Date: 10/28/2021 CLINICAL DATA:  Code stroke. Neuro deficit, acute, stroke suspected. Right-sided numbness. EXAM: CT HEAD WITHOUT CONTRAST TECHNIQUE: Contiguous axial images were obtained from the base of the skull through the vertex without intravenous contrast. RADIATION DOSE REDUCTION: This exam was performed according to the departmental dose-optimization program which includes automated exposure control, adjustment of the mA and/or kV according to patient size and/or use of iterative reconstruction technique. COMPARISON:  CT head without contrast 03/07/2021. MR head without contrast 03/08/2021 FINDINGS: Brain: Remote left parietal infarct noted. Moderate white matter disease is present  bilaterally. Basal ganglia are intact. No other acute cortical abnormality is present. No acute infarct, hemorrhage, or mass lesion is present. Ventricles are of normal size. No significant extraaxial fluid collection is present. The brainstem and cerebellum are within normal limits. Vascular: Atherosclerotic calcifications are present within the cavernous internal carotid arteries and at the dural margin of both vertebral arteries. No hyperdense vessel is present. Skull: Calvarium is intact. No focal lytic or blastic lesions are present. No significant extracranial soft tissue lesion is present. Sinuses/Orbits: Chronic soft tissue thickening is present the inferior right maxillary sinus. The paranasal sinuses and mastoid air cells are otherwise clear. Bilateral lens replacements are noted. Globes and orbits are otherwise unremarkable. Other: ASPECTS ([place] Stroke Program Early CT Score) - Ganglionic level infarction (caudate, lentiform nuclei, internal capsule, insula, M1-M3 cortex): 7/7 - Supraganglionic infarction (M4-M6 cortex): 3/3 Total score (0-10 with 10 being normal): 10/10 IMPRESSION: 1. No acute intracranial abnormality. 2. Expected evolution of left parietal infarct. 3. Moderate white matter disease likely reflects the sequela of chronic microvascular ischemia. Electronically Signed   By: San Morelle M.D.   On: 10/28/2021 16:18   CT ANGIO HEAD NECK W WO CM W PERF (CODE STROKE)  Result Date: 10/28/2021 CLINICAL DATA:  Stroke, follow up EXAM: CT ANGIOGRAPHY HEAD AND NECK CT PERFUSION BRAIN TECHNIQUE: Multidetector CT imaging of the head and neck was performed using the standard protocol during bolus administration of intravenous contrast. Multiplanar CT image reconstructions and MIPs were obtained to evaluate the vascular anatomy. Carotid stenosis measurements (when applicable) are obtained utilizing NASCET criteria, using the distal internal carotid diameter as the denominator. Multiphase CT  imaging of the brain was performed following IV bolus contrast injection. Subsequent parametric perfusion maps were calculated using RAPID software. RADIATION DOSE REDUCTION: This exam was performed according to the departmental dose-optimization program which includes automated exposure control, adjustment of the mA and/or kV according to patient size and/or use of iterative reconstruction technique. CONTRAST:  152m OMNIPAQUE IOHEXOL  350 MG/ML SOLN COMPARISON:  October 2022 FINDINGS: CTA NECK Aortic arch: Mild mixed plaque. Great vessel origins are patent. Mixed plaque along the proximal left subclavian with less than 50% stenosis. Right carotid system: Patent.  No stenosis. Left carotid system: Patent. Minimal calcified plaque at the bifurcation. No stenosis. Vertebral arteries: Patent. Mixed plaque causing marked stenosis of the left vertebral origin without apparent flow limitation. Skeleton: Cervical spine degenerative changes. Other neck: Unremarkable. Upper chest: Emphysema. Review of the MIP images confirms the above findings CTA HEAD Anterior circulation: Intracranial internal carotid arteries are patent with calcified plaque causing up to mild stenosis. Anterior and middle cerebral arteries are patent. There is atherosclerotic irregularity and mild stenoses. Posterior circulation: Intracranial vertebral arteries are patent. Noncalcified plaque on the left causes mild stenosis. Basilar artery is patent. Major cerebellar artery origins are patent. Posterior cerebral arteries are patent. There is atherosclerotic irregularity including moderate stenosis of the right P2 PCA. Venous sinuses: Patent as allowed by contrast bolus timing. Review of the MIP images confirms the above findings CT Brain Perfusion Findings: CBF (<30%) Volume: 46m Perfusion (Tmax>6.0s) volume: 021mMismatch Volume: 32m79mnfarction Location: None. IMPRESSION: No large vessel occlusion. Perfusion imaging demonstrates no evidence of core  infarction or penumbra, but there is exclusion of a portion of the left cerebral hemisphere from the data. Marked stenosis of the left vertebral origin without apparent flow limitation. Similar intracranial atherosclerosis. Electronically Signed   By: PraMacy MisD.   On: 10/28/2021 17:13    Procedures .Critical Care Performed by: MilNoemi ChapelD Authorized by: MilNoemi ChapelD   Critical care provider statement:    Critical care time (minutes):  30   Critical care time was exclusive of:  Separately billable procedures and treating other patients and teaching time   Critical care was necessary to treat or prevent imminent or life-threatening deterioration of the following conditions:  CNS failure or compromise   Critical care was time spent personally by me on the following activities:  Development of treatment plan with patient or surrogate, discussions with consultants, evaluation of patient's response to treatment, examination of patient, ordering and review of laboratory studies, ordering and review of radiographic studies, ordering and performing treatments and interventions, pulse oximetry, re-evaluation of patient's condition, review of old charts and obtaining history from patient or surrogate   I assumed direction of critical care for this patient from another provider in my specialty: no     Care discussed with: admitting provider   Comments:          Medications Ordered in ED Medications  levETIRAcetam (KEPPRA) IVPB 500 mg/100 mL premix (has no administration in time range)  0.9 %  sodium chloride infusion (has no administration in time range)  levETIRAcetam (KEPPRA) IVPB 1000 mg/100 mL premix (0 mg Intravenous Stopped 10/28/21 1625)    Followed by  levETIRAcetam (KEPPRA) IVPB 1000 mg/100 mL premix (1,000 mg Intravenous New Bag/Given 10/28/21 1623)  LORazepam (ATIVAN) injection 2 mg (2 mg Intravenous Given 10/28/21 1609)  iohexol (OMNIPAQUE) 350 MG/ML injection 100 mL (100 mLs  Intravenous Contrast Given 10/28/21 1657)    ED Course/ Medical Decision Making/ A&P                           Medical Decision Making Risk Prescription drug management. Decision regarding hospitalization.   This patient presents to the ED for concern of seizure versus strokes, this involves an extensive number of treatment options,  and is a complaint that carries with it a high risk of complications and morbidity.  The differential diagnosis includes potential stroke versus seizure, think hypoglycemia, she has had a prior stroke in the left parietal region, this could be an extension of that or caused by that ischemic area.  She has not had seizures in the past and is currently in what appears to be status epilepticus.   Co morbidities that complicate the patient evaluation  Prior stroke, on medications including metformin, losartan, atenolol, alprazolam, amlodipine, Synthroid and Protonix as well as Cymbalta   Additional history obtained:  Additional history obtained from son, neurologist in the medical record External records from outside source obtained and reviewed including prior strokes, MRI of the brain performed in October 2022 confirms the patient's prior stroke which appeared to be patchy infarcts clustered in the left MCA territory.   Lab Tests:  I Ordered, and personally interpreted labs.  The pertinent results include: Creatinine is 2.0, BUN of 33, this is fairly close to the patient's baseline.  Alcohol undetectable, CBC without acute findings and metabolic panel showing no significant electrolyte abnormalities.   Imaging Studies ordered:  I ordered imaging studies including CT scan of the head as well as CT scan of angio scans of the head and neck I independently visualized and interpreted imaging which showed no acute new ischemic areas or angiographically identified obstructions, known infarct which appears to be evolving I agree with the radiologist  interpretation   Cardiac Monitoring: / EKG:  The patient was maintained on a cardiac monitor.  I personally viewed and interpreted the cardiac monitored which showed an underlying rhythm of: Normal sinus rhythm   Consultations Obtained:  I requested consultation with the neurologist Dr. Leonie Man,  and discussed lab and imaging findings as well as pertinent plan - they recommend: Admission to the hospitalist service overnight for monitoring, he has ordered EEG, Keppra has been ordered D/w internal medicine teaching service resident - who will admit Pt's primary doctor is in [place] - has not seen them yet - she is between doctors.   Problem List / ED Course / Critical interventions / Medication management  The patient has had status epilepticus, critically ill, improving after 2 mg of Ativan and now being loaded with Keppra.  Maintaining her airway at this time but will need higher level of care I ordered medication including Ativan and levetiracetam for seizure activity, status epilepticus Reevaluation of the patient after these medicines showed that the patient critically ill but improving I have reviewed the patients home medicines and have made adjustments as needed Renal insufficiency, compared to prior labs this seems to be somewhat chronic, IV fluids will be given, I did discuss with neurology and they requested maintenance fluids at about 75 cc an hour   Social Determinants of Health:  None   Test / Admission - Considered:  Consider to stepdown, high level of care overnight.         Final Clinical Impression(s) / ED Diagnoses Final diagnoses:  Status epilepticus ([place])  Renal insufficiency     Noemi Chapel, MD 10/28/21 [phone]

## 2021-10-28 NOTE — Progress Notes (Signed)
Tech will come back to do EEG, Patient adamant for dinner.

## 2021-10-28 NOTE — ED Notes (Signed)
The pt is sl confused   ?? Normal for her   her son is at the bedside   she has voided in the bed  lenin changed she is hungry   sandwich given

## 2021-10-28 NOTE — Consult Note (Addendum)
Neurology Consultation  Reason for Consult: Code stroke  Referring Physician: dr. Sabra Heck   CC: right side weakness and aphasia  History is obtained from:son at bedside and medical record   HPI: Lisa Sutton is a 88 y.o. female with past medical history of CVA with residual right side weakness and visual loss, HTN, HLD,DM, GERD, dysphagia who presents to [facility] ED via private vehicle for evaluation of right side weakness and aphasia. Per son, LKW 1500 when she started to have twitching/jerking of her right leg, then arm and face. Code stroke was called at triage. On Exam in CT scan patient had right face twitching, and right leg twitching. She was able to follow commands. Patient given 2 mg ativan IV which ceased the seizure activity. CT head with no acute process. She was loaded with 2gm Keppra IV. Back in the room still had slight facial twitching, garbled speech but able to follow commands.  Per son, LKW 1500 when she started to have twitching/jerking of her right leg, then arm and face.   LKW: 1500 tpa given?: no, not acute stroke, actively seizing Mechanical thrombectomy : No as no LVO Premorbid modified Rankin scale (mRS):  3-Moderate disability-requires help but walks WITHOUT assistance  Prior stroke admission 03/06/2021 to 03/13/2021 due to left MCA infarct due to left M2 inferior division occlusion treated with mechanical thrombectomy with residual right homonymous hemianopsia and right hand weakness.  MRI showed patchy clustered infarcts in the superior division left MCA..  Stroke etiology felt to be cardioembolic but 29-JJO heart monitor did not show paroxysmal A-fib.  Patient was on aspirin 81 mg prior to current admission.  Vascular risk factors of hypertension, diabetes, hyperlipidemia with history of statin intolerance.  Intracranial stenosis  ROS:  Unable to obtain due to active seizure   Past Medical History:  Diagnosis Date   Anxiety    Arthritis    Cataract    CVA  (cerebral vascular accident) ([place]) 03/06/2021   Dizziness    Dysphagia    'sometimes but not a major issue' been checked out by GI (per pt)   Dysrhythmia    'heart used to skip but doesn't anymore' was checked out by Dr. Ron Parker late '90s, everything checked out ok and not had any skipping since (all per pt)   Essential hypertension, benign    Fatty liver    GERD (gastroesophageal reflux disease)    H/O hypercalcemia 04/12/2017   Hepatitis B    had at age 65, 'GI doc said it's gone away'   Hepatitis B surface antigen positive    High cholesterol    History of hiatal hernia    Hypercalcemia    Hypothyroidism    Kidney stone 12/2012   Patient was seen at [facility]   Obesity    Osteoporosis    PONV (postoperative nausea and vomiting)    Type 2 diabetes mellitus ([place])      Family History  Problem Relation Age of Onset   COPD Mother    Heart attack Mother    Stroke Father    Hypertension Father    Hip fracture Father    Cancer Maternal Aunt    Diabetes Maternal Aunt    Diabetes Maternal Uncle    Colon cancer Neg Hx      Social History:   reports that she has never smoked. She has been exposed to tobacco smoke. She has never used smokeless tobacco. She reports that she does not drink alcohol and does not  use drugs.  Medications  Current Facility-Administered Medications:    [COMPLETED] levETIRAcetam (KEPPRA) IVPB 1000 mg/100 mL premix, 1,000 mg, Intravenous, Once, Stopped at 10/28/21 1625 **FOLLOWED BY** levETIRAcetam (KEPPRA) IVPB 1000 mg/100 mL premix, 1,000 mg, Intravenous, Once, Garvin Fila, MD, Last Rate: 400 mL/hr at 10/28/21 1623, 1,000 mg at 10/28/21 1623  Current Outpatient Medications:    ACCU-CHEK GUIDE test strip, 3 (three) times daily., Disp: , Rfl:    ALPRAZolam (XANAX) 0.25 MG tablet, 1 tablet as needed, Disp: , Rfl:    amLODipine (NORVASC) 10 MG tablet, Take 10 mg by mouth daily., Disp: , Rfl:    aspirin EC 81 MG tablet, Take 81 mg by mouth daily. Swallow  whole., Disp: , Rfl:    atenolol (TENORMIN) 25 MG tablet, Take 1 tablet (25 mg total) by mouth daily., Disp: 30 tablet, Rfl: 0   diphenhydrAMINE (BENADRYL) 25 MG tablet, Take 1 tablet (25 mg total) by mouth every 6 (six) hours as needed., Disp: 20 tablet, Rfl: 0   DULoxetine (CYMBALTA) 30 MG capsule, Take 1 capsule (30 mg total) by mouth daily. (Patient taking differently: Take 30 mg by mouth at bedtime.), Disp: 90 capsule, Rfl: 0   hydrOXYzine (ATARAX) 50 MG tablet, Take 50 mg by mouth at bedtime as needed., Disp: , Rfl:    irbesartan (AVAPRO) 150 MG tablet, Take 1 tablet (150 mg total) by mouth daily., Disp: 60 tablet, Rfl: 0   levothyroxine (SYNTHROID) 125 MCG tablet, Take 1 tablet (125 mcg total) by mouth daily before breakfast., Disp: 90 tablet, Rfl: 3   losartan (COZAAR) 100 MG tablet, Take 100 mg by mouth daily., Disp: , Rfl:    meclizine (ANTIVERT) 25 MG tablet, SMARTSIG:1 Tablet(s) By Mouth Every 12 Hours PRN, Disp: , Rfl:    metFORMIN (GLUCOPHAGE-XR) 500 MG 24 hr tablet, Take 500 mg by mouth daily., Disp: , Rfl:    ondansetron (ZOFRAN) 4 MG tablet, Take 4 mg by mouth every 8 (eight) hours as needed., Disp: , Rfl:    pantoprazole (PROTONIX) 40 MG tablet, Take 1 tablet (40 mg total) by mouth daily before lunch., Disp: 90 tablet, Rfl: 3   rosuvastatin (CRESTOR) 40 MG tablet, Take 40 mg by mouth at bedtime., Disp: , Rfl:    triamcinolone cream (KENALOG) 0.1 %, Apply topically., Disp: , Rfl:    Exam: Current vital signs: BP (!) 161/88   Pulse 81   Temp 97.7 F (36.5 C) (Oral)   Resp (!) 22   SpO2 97%  Vital signs in last 24 hours: Temp:  [97.7 F (36.5 C)] 97.7 F (36.5 C) (06/07 1552) Pulse Rate:  [81] 81 (06/07 1552) Resp:  [22] 22 (06/07 1552) BP: (161)/(88) 161/88 (06/07 1552) SpO2:  [97 %] 97 % (06/07 1552)  GENERAL: Awake, alert and actively seizing with right face and lower extremity jerking  HEENT: - Normocephalic and atraumatic, dry mm LUNGS - Clear to auscultation  bilaterally with no wheezes CV - S1S2 RRR, no m/r/g, equal pulses bilaterally. ABDOMEN - Soft, nontender, nondistended with normoactive BS Ext: warm, well perfused, intact peripheral pulses, no edema  NEURO:  Mental Status: Awake and follows simple commands.   Language: speech is garbled.  Naming, repetition, fluency, and comprehension intact. Cranial Nerves: PERRL 2 mm/brisk. EOMI, visual fields full, right facial asymmetry, facial sensation intact, hearing intact, tongue/uvula/soft palate midline, normal sternocleidomastoid and trapezius muscle strength. No evidence of tongue atrophy or fibrillations Motor: Right arm, leg and face twitching, able to move all  extremities antigravity but right side appears weaker than left Tone: is normal and bulk is normal Sensation- Intact to light touch bilaterally Coordination: unable to assess  Gait- deferred  NIHSS 1a Level of Conscious.: 0 1b LOC Questions: 2 1c LOC Commands: 0 2 Best Gaze: 0 3 Visual: 2 4 Facial Palsy: 1 5a Motor Arm - left: 0 5b Motor Arm - Right: 2 6a Motor Leg - Left: 0 6b Motor Leg - Right:4 7 Limb Ataxia: 0 8 Sensory: 1 9 Best Language: 2 10 Dysarthria: 2 11 Extinct. and Inatten.: 0 TOTAL: 16   Imaging I have reviewed the images obtained:  CT-head 1. No acute intracranial abnormality. 2. Expected evolution of left parietal infarct. 3. Moderate white matter disease likely reflects the sequela of chronic microvascular ischemia. CT angio brain and neck no large vessel occlusion. CT perfusion no core infarct or penumbra Assessment:  Lisa Sutton is a 88 y.o. female with past medical history of CVA with residual right side weakness and visual loss, HTN, HLD,DM, GERD, dysphagia who presents [facility] ED via private vehicle for evaluation of right side weakness and aphasia. Per son, LKW 1500 when she started to have twitching/jerking of her right leg, then arm and face. Code stroke was called at triage. On Exam in CT  scan patient had right face twitching, and right leg twitching. She was able to follow commands. Patient given 2 mg ativan IV which ceased the seizure activity. CT head with no acute process. She was loaded with 2gm Keppra IV.  1.  Symptomatic partial epilepsy with refractory seizures with focal status epilepticus Impression: New onset seizure with focal tatus epilepticus Symptomatic epilepsy secondary to old stroke Remote left MCA infarct due to left M2 occlusion s/p mechanical thrombectomy in October 2022 Hypertension hyperlipidemia diabetes Chronic renal insufficiency   Recommendations: - Admit to medicine  - continue Keppra '500mg'$  IV BID - Seizure precautions - Bedside swallow eval  - EEG followed by LTM - MRI brain when able  - Check UDS - telemetry  Neuro hospitalist team will follow  [place] Per [facility] statutes, patients with seizures are not allowed to drive until they have been seizure-free for six months.   Use caution when using heavy equipment or power tools. Avoid working on ladders or at heights. Take showers instead of baths. Ensure the water temperature is not too high on the home water heater. Do not go swimming alone. Do not lock yourself in a room alone (i.e. bathroom). When caring for infants or small children, sit down when holding, feeding, or changing them to minimize risk of injury to the child in the event you have a seizure. Maintain good sleep hygiene. Avoid alcohol.    If patient has another seizure, call 911 and bring them back to the ED if: A.  The seizure lasts longer than 5 minutes.      B.  The patient doesn't wake shortly after the seizure or has new problems such as difficulty seeing, speaking or moving following the seizure C.  The patient was injured during the seizure D.  The patient has a temperature over 102 F (39C) E.  The patient vomited during the seizure and now is having trouble breathing   Beulah Gandy DNP, ACNPC-AG    STROKE MD NOTE :  I have personally obtained history,examined this patient, reviewed notes, independently viewed imaging studies, participated in medical decision making and plan of care.ROS completed by me personally and pertinent positives  fully documented  I have made any additions or clarifications directly to the above note. Agree with note above.  Patient presented with sudden onset of right leg twitching's initially subsequently involving the right face as well and was called a code stroke on exam she was clearly having focal seizures continuously.  She did not resolve with IV Ativan 1 mg but seem to settle down with the second dose of 1 mg.  She was loaded with 2 g of Keppra but continued to have minor twitches of the right jaw of the leg movements stopped.  Initially she was alert and able to follow commands and subsequently became drowsy and is noted to have right hemianopsia and right-sided weakness.  Emergent CT angio and perfusion were obtained which showed no LVO or perfusion defect or core infarct.  Continue Keppra 5 mg twice daily for seizure prophylaxis and keep her on overnight long-term EEG monitoring to monitor for signs and seizures.  We will obtain MRI scan later in stable.  Patient be admitted to the medical team and neuro hospitalist team will follow on consults.  Long discussion with the patient and son at the bedside and answered questions.  Discussed with Dr. Sabra Heck ER MD. This patient is critically ill and at significant risk of neurological worsening, death and care requires constant monitoring of vital signs, hemodynamics,respiratory and cardiac monitoring, extensive review of multiple databases, frequent neurological assessment, discussion with family, other specialists and medical decision making of high complexity.I have made any additions or clarifications directly to the above note.This critical care time does not reflect procedure time, or teaching time or supervisory time  of PA/NP/Med Resident etc but could involve care discussion time.  I spent 50 minutes of neurocritical care time  in the care of  this patient.      Antony Contras, MD Medical Director [facility] Pager: [phone] 10/28/2021 5:28 PM

## 2021-10-29 ENCOUNTER — Other Ambulatory Visit: Payer: Self-pay

## 2021-10-29 ENCOUNTER — Other Ambulatory Visit (HOSPITAL_COMMUNITY): Payer: Self-pay

## 2021-10-29 ENCOUNTER — Encounter (HOSPITAL_COMMUNITY): Payer: Self-pay | Admitting: Internal Medicine

## 2021-10-29 ENCOUNTER — Inpatient Hospital Stay (HOSPITAL_COMMUNITY): Payer: Medicare Other

## 2021-10-29 DIAGNOSIS — G40901 Epilepsy, unspecified, not intractable, with status epilepticus: Secondary | ICD-10-CM | POA: Diagnosis present

## 2021-10-29 DIAGNOSIS — E1122 Type 2 diabetes mellitus with diabetic chronic kidney disease: Secondary | ICD-10-CM | POA: Diagnosis present

## 2021-10-29 DIAGNOSIS — I129 Hypertensive chronic kidney disease with stage 1 through stage 4 chronic kidney disease, or unspecified chronic kidney disease: Secondary | ICD-10-CM | POA: Diagnosis present

## 2021-10-29 DIAGNOSIS — R471 Dysarthria and anarthria: Secondary | ICD-10-CM | POA: Diagnosis present

## 2021-10-29 DIAGNOSIS — R569 Unspecified convulsions: Secondary | ICD-10-CM | POA: Diagnosis not present

## 2021-10-29 DIAGNOSIS — Z7984 Long term (current) use of oral hypoglycemic drugs: Secondary | ICD-10-CM | POA: Diagnosis not present

## 2021-10-29 DIAGNOSIS — Z87891 Personal history of nicotine dependence: Secondary | ICD-10-CM | POA: Diagnosis not present

## 2021-10-29 DIAGNOSIS — Z1152 Encounter for screening for COVID-19: Secondary | ICD-10-CM | POA: Diagnosis not present

## 2021-10-29 DIAGNOSIS — I69320 Aphasia following cerebral infarction: Secondary | ICD-10-CM | POA: Diagnosis not present

## 2021-10-29 DIAGNOSIS — E039 Hypothyroidism, unspecified: Secondary | ICD-10-CM | POA: Diagnosis present

## 2021-10-29 DIAGNOSIS — Z79899 Other long term (current) drug therapy: Secondary | ICD-10-CM | POA: Diagnosis not present

## 2021-10-29 DIAGNOSIS — Z79890 Hormone replacement therapy (postmenopausal): Secondary | ICD-10-CM | POA: Diagnosis not present

## 2021-10-29 DIAGNOSIS — Z888 Allergy status to other drugs, medicaments and biological substances status: Secondary | ICD-10-CM | POA: Diagnosis not present

## 2021-10-29 DIAGNOSIS — I69398 Other sequelae of cerebral infarction: Secondary | ICD-10-CM | POA: Diagnosis not present

## 2021-10-29 DIAGNOSIS — N184 Chronic kidney disease, stage 4 (severe): Secondary | ICD-10-CM | POA: Diagnosis present

## 2021-10-29 DIAGNOSIS — F419 Anxiety disorder, unspecified: Secondary | ICD-10-CM | POA: Diagnosis present

## 2021-10-29 DIAGNOSIS — R1310 Dysphagia, unspecified: Secondary | ICD-10-CM | POA: Diagnosis present

## 2021-10-29 DIAGNOSIS — Z87442 Personal history of urinary calculi: Secondary | ICD-10-CM | POA: Diagnosis not present

## 2021-10-29 DIAGNOSIS — G40101 Localization-related (focal) (partial) symptomatic epilepsy and epileptic syndromes with simple partial seizures, not intractable, with status epilepticus: Secondary | ICD-10-CM | POA: Diagnosis present

## 2021-10-29 DIAGNOSIS — H53461 Homonymous bilateral field defects, right side: Secondary | ICD-10-CM | POA: Diagnosis present

## 2021-10-29 DIAGNOSIS — I639 Cerebral infarction, unspecified: Secondary | ICD-10-CM | POA: Diagnosis present

## 2021-10-29 DIAGNOSIS — M810 Age-related osteoporosis without current pathological fracture: Secondary | ICD-10-CM | POA: Diagnosis present

## 2021-10-29 DIAGNOSIS — I69351 Hemiplegia and hemiparesis following cerebral infarction affecting right dominant side: Secondary | ICD-10-CM | POA: Diagnosis not present

## 2021-10-29 DIAGNOSIS — K219 Gastro-esophageal reflux disease without esophagitis: Secondary | ICD-10-CM | POA: Diagnosis present

## 2021-10-29 DIAGNOSIS — E7800 Pure hypercholesterolemia, unspecified: Secondary | ICD-10-CM | POA: Diagnosis present

## 2021-10-29 DIAGNOSIS — Z7982 Long term (current) use of aspirin: Secondary | ICD-10-CM | POA: Diagnosis not present

## 2021-10-29 LAB — LIPID PANEL
Cholesterol: 113 mg/dL (ref 0–200)
HDL: 39 mg/dL — ABNORMAL LOW (ref >40)
LDL Cholesterol: 30 mg/dL (ref 0–99)
Total CHOL/HDL Ratio: 2.9 RATIO
Triglycerides: 218 mg/dL — ABNORMAL HIGH (ref <150)
VLDL: 44 mg/dL — ABNORMAL HIGH (ref 0–40)

## 2021-10-29 LAB — BASIC METABOLIC PANEL
Anion gap: 10 (ref 5–15)
BUN: 28 mg/dL — ABNORMAL HIGH (ref 8–23)
CO2: 21 mmol/L — ABNORMAL LOW (ref 22–32)
Calcium: 8.8 mg/dL — ABNORMAL LOW (ref 8.9–10.3)
Chloride: 108 mmol/L (ref 98–111)
Creatinine, Ser: 1.7 mg/dL — ABNORMAL HIGH (ref 0.44–1.00)
GFR, Estimated: 29 mL/min — ABNORMAL LOW (ref >60)
Glucose, Bld: 136 mg/dL — ABNORMAL HIGH (ref 70–99)
Potassium: 4.8 mmol/L (ref 3.5–5.1)
Sodium: 139 mmol/L (ref 135–145)

## 2021-10-29 LAB — URINALYSIS, ROUTINE W REFLEX MICROSCOPIC
Bilirubin Urine: NEGATIVE
Glucose, UA: NEGATIVE mg/dL
Hgb urine dipstick: NEGATIVE
Ketones, ur: NEGATIVE mg/dL
Nitrite: NEGATIVE
Protein, ur: 100 mg/dL — AB
Specific Gravity, Urine: 1.029 (ref 1.005–1.030)
pH: 5 (ref 5.0–8.0)

## 2021-10-29 LAB — TSH: TSH: 0.483 u[IU]/mL (ref 0.350–4.500)

## 2021-10-29 LAB — RAPID URINE DRUG SCREEN, HOSP PERFORMED
Amphetamines: NOT DETECTED
Barbiturates: NOT DETECTED
Benzodiazepines: NOT DETECTED
Cocaine: NOT DETECTED
Opiates: NOT DETECTED
Tetrahydrocannabinol: NOT DETECTED

## 2021-10-29 LAB — GLUCOSE, CAPILLARY: Glucose-Capillary: 156 mg/dL — ABNORMAL HIGH (ref 70–99)

## 2021-10-29 LAB — CBG MONITORING, ED: Glucose-Capillary: 197 mg/dL — ABNORMAL HIGH (ref 70–99)

## 2021-10-29 MED ORDER — LEVETIRACETAM 500 MG PO TABS
500.0000 mg | ORAL_TABLET | Freq: Two times a day (BID) | ORAL | Status: DC
  Administered 2021-10-29 – 2021-10-30 (×2): 500 mg via ORAL
  Filled 2021-10-29 (×2): qty 1

## 2021-10-29 NOTE — Evaluation (Signed)
Physical Therapy Evaluation Patient Details Name: Lisa Sutton MRN: [id number] DOB: 20-Oct-1933 Today's Date: 10/29/2021  History of Present Illness  Pt is an 88 y/o female admitted secondary to seizures. EEG showed evidence of epileptogenicity and cortical dysfunction arising from left temporal region likely due to underlying stroke. Awaiting MRI. PMH includes CVA with R deficits and aphasia, Hep B, and DM.  Clinical Impression  Pt admitted secondary to problem above with deficits below. Noted motor apraxia when attempting to perform UE assessment; would kick LE out on same side when moving UE. Requiring min guard A for transfers and gait. Noted mild unsteadiness with head turns and required up to min A for steadying. Educated about using AD at home to increase safety. Son reports he lives with pt and can assist if needed. Recommending HHPT to address current mobility deficits. Would also benefit from [facility] to assist with ADL tasks. Will continue to follow acutely.      Recommendations for follow up therapy are one component of a multi-disciplinary discharge planning process, led by the attending physician.  Recommendations may be updated based on patient status, additional functional criteria and insurance authorization.  Follow Up Recommendations Home health PT [facility])    Assistance Recommended at Discharge Frequent or constant Supervision/Assistance (initially)  Patient can return home with the following  Direct supervision/assist for financial management;Direct supervision/assist for medications management;Assistance with cooking/housework;Assist for transportation;Help with stairs or ramp for entrance    Equipment Recommendations None recommended by PT  Recommendations for Other Services       Functional Status Assessment Patient has had a recent decline in their functional status and demonstrates the ability to make significant improvements in function in a reasonable and  predictable amount of time.     Precautions / Restrictions Precautions Precautions: Fall Restrictions Weight Bearing Restrictions: No      Mobility  Bed Mobility Overal bed mobility: Needs Assistance Bed Mobility: Supine to Sit, Sit to Supine     Supine to sit: Min assist Sit to supine: Min assist, +2 for safety/equipment   General bed mobility comments: Min A for trunk elevation to come to sitting. Min A for LE assist for return to supine on higher stretcher height.    Transfers Overall transfer level: Needs assistance Equipment used: 1 person hand held assist Transfers: Sit to/from Stand Sit to Stand: Min guard, +2 safety/equipment           General transfer comment: Min guard for safety.    Ambulation/Gait Ambulation/Gait assistance: Min guard, Min assist Gait Distance (Feet): 100 Feet Assistive device: None Gait Pattern/deviations: Step-through pattern, Decreased stride length Gait velocity: Decreased     General Gait Details: Mild unsteadiness, requiring min guard A. Noted increased unsteadiness when performing horizontal head turns and required min A. Educated about using AD at home  Stairs            Wheelchair Mobility    Modified Rankin (Stroke Patients Only) Modified Rankin (Stroke Patients Only) Pre-Morbid Rankin Score: No significant disability Modified Rankin: Moderately severe disability     Balance Overall balance assessment: Needs assistance Sitting-balance support: No upper extremity supported, Feet supported Sitting balance-Leahy Scale: Good     Standing balance support: No upper extremity supported Standing balance-Leahy Scale: Fair                               Pertinent Vitals/Pain Pain Assessment Pain Assessment: No/denies pain  Home Living Family/patient expects to be discharged to:: Private residence Living Arrangements: Children Available Help at Discharge: Family Type of Home: House Home Access:  Stairs to enter Entrance Stairs-Rails: Right;Left;Can reach both Entrance Stairs-Number of Steps: 2   Home Layout: Two level;Able to live on main level with bedroom/bathroom Home Equipment: Rolling Walker (2 wheels);Cane - single point;BSC/3in1;Shower seat - built in;Grab bars - tub/shower      Prior Function Prior Level of Function : Needs assist             Mobility Comments: Ambulates without AD ADLs Comments: Able to perform ADLs independently. Son cooks and drives     Journalist, newspaper        Extremity/Trunk Assessment   Upper Extremity Assessment Upper Extremity Assessment: Defer to OT evaluation    Lower Extremity Assessment Lower Extremity Assessment: RLE deficits/detail (Motor apraxia noted. When pt asked to perform task with UEs, would kick LE on same side) RLE Deficits / Details: Weakness at baseline.    Cervical / Trunk Assessment Cervical / Trunk Assessment: Normal  Communication   Communication: Expressive difficulties;HOH (expressive difficulty at baseline and son reports he feels she is close to baseline)  Cognition Arousal/Alertness: Awake/alert Behavior During Therapy: WFL for tasks assessed/performed Overall Cognitive Status: Difficult to assess                                 General Comments: Aphasic, but was able to communicate in complete sentences at times. Noted motor apraxia and difficulty sequencing when attempting MMT and coordination test.        General Comments General comments (skin integrity, edema, etc.): Pt's son present during session    Exercises     Assessment/Plan    PT Assessment Patient needs continued PT services  PT Problem List Decreased strength;Decreased balance;Decreased activity tolerance;Decreased mobility;Decreased safety awareness;Decreased cognition       PT Treatment Interventions DME instruction;Gait training;Stair training;Functional mobility training;Therapeutic activities;Therapeutic  exercise;Balance training;Patient/family education;Cognitive remediation    PT Goals (Current goals can be found in the Care Plan section)  Acute Rehab PT Goals Patient Stated Goal: to go home PT Goal Formulation: With patient Time For Goal Achievement: 11/12/21 Potential to Achieve Goals: Good    Frequency Min 4X/week     Co-evaluation PT/OT/SLP Co-Evaluation/Treatment: Yes Reason for Co-Treatment: For patient/therapist safety;To address functional/ADL transfers PT goals addressed during session: Mobility/safety with mobility;Balance         AM-PAC PT "6 Clicks" Mobility  Outcome Measure Help needed turning from your back to your side while in a flat bed without using bedrails?: A Little Help needed moving from lying on your back to sitting on the side of a flat bed without using bedrails?: A Little Help needed moving to and from a bed to a chair (including a wheelchair)?: A Little Help needed standing up from a chair using your arms (e.g., wheelchair or bedside chair)?: A Little Help needed to walk in hospital room?: A Little Help needed climbing 3-5 steps with a railing? : A Lot 6 Click Score: 17    End of Session Equipment Utilized During Treatment: Gait belt Activity Tolerance: Patient tolerated treatment well Patient left: in bed;with call bell/phone within reach;with family/visitor present (on stretcher in ED) Nurse Communication: Mobility status PT Visit Diagnosis: Unsteadiness on feet (R26.81);Muscle weakness (generalized) (M62.81)    Time: 8469-6295 PT Time Calculation (min) (ACUTE ONLY): 26 min   Charges:  PT Evaluation $PT Eval Moderate Complexity: 1 Mod          Reuel Derby, PT, DPT  Acute Rehabilitation Services  Office: [phone]   Rudean Hitt 10/29/2021, 3:57 PM

## 2021-10-29 NOTE — Progress Notes (Signed)
  Date: 10/29/2021  Patient name: KENAE LINDQUIST  Medical record number: [id number]  Date of birth: 1934-04-05   I have seen and evaluated [name] and discussed their care with the Residency Team. Briefly, Ms. Diosdado came in with seizures which were confirmed on EEG.  She was admitted and evaluated by Neurology. She is much improved this morning and neurologic status is back to post stroke baseline.  She is on Keppra.  PT and OT ordered.  Vitals:   10/29/21 1230 10/29/21 1330  BP: 133/77 127/71  Pulse: 73 75  Resp: 18 17  Temp:    SpO2: 97% 96%   Gen: Lying in bed, undergoing EEG Eyes: Anicteric, EOMI HENT: neck is supple, no carotid bruit CV: Regular, normal rhythm, no murmur Pulm: Breathing comfortably on room air Abd: Soft, +BS.  MSK: normal tone and bulk Neuro: Sensation intact, some mild weakness in the RLE which is baseline for her.   Assessment and Plan: I have seen and evaluated the patient as outlined above. I agree with the formulated Assessment and Plan as detailed in the residents' note, with the following changes:   1. New Onset Seizure, prior stroke - Continue keppra - Seizure precautions - Follow up Neurology recommendations - MRI - PT/OT  Other issues per Dr. Howie Ill' daily note.   Sid Falcon, MD 6/8/20234:01 PM

## 2021-10-29 NOTE — Procedures (Addendum)
Patient Name: Lisa Sutton  MRN: [id number]  Epilepsy Attending: Lora Havens  Referring Physician/Provider: Janine Ores, NP Duration: 10/28/2021 2252 to 10/29/2021 1140   Patient history: 88yo F presented with sudden onset of right leg twitching's initially subsequently involving the right face as well and was called a code stroke on exam she was clearly having focal seizures continuously. EEG to evaluate for seizure   Level of alertness: awake, asleep   AEDs during EEG study: LEV   Technical aspects: This EEG study was done with scalp electrodes positioned according to the 10-20 International system of electrode placement. Electrical activity was acquired at a sampling rate of '500Hz'$  and reviewed with a high frequency filter of '70Hz'$  and a low frequency filter of '1Hz'$ . EEG data were recorded continuously and digitally stored.    Description: At the beginning of the study, EEG showed continuous generalized and maximal left temporal 3 to 6 Hz theta-delta slowing admixed with an excessive amount of 15 to 18 Hz beta activity distributed symmetrically and diffusely.   Gradually, EEG improved and showed posterior dominant rhythm of 9-10 Hz activity of moderate voltage (25-35 uV) seen predominantly in posterior head regions, symmetric and reactive to eye opening and eye closing. Sleep was characterized by vertex waves, sleep spindles (12 to 14 Hz), maximal frontocentral region.   EEG also showed intermittent left temporal 3 to 5 Hz theta-delta slowing. Sharp waves was noted in left temporal  region. Hyperventilation and photic stimulation were not performed.      ABNORMALITY - Sharp wave, left temporal region.  -Intermittent slow, left temporal region  IMPRESSION:  At the beginning of the study, EEG was suggestive of moderate to severe diffuse encephalopathy which gradually improved. EEG then showed evidence of epileptogenicity and cortical dysfunction arising from left temporal region likely  due to underlying stroke.   Abdelaziz Westenberger [name]

## 2021-10-29 NOTE — TOC Benefit Eligibility Note (Signed)
Patient Teacher, English as a foreign language completed.    The patient is currently admitted and upon discharge could be taking Valtoco 10/0.1 ml liqd.  Requires Prior Authorization  The patient is insured through [employer] part D     Lyndel Safe, [place] Patient Advocate Specialist [place] Patient Advocate Team Direct Number: [phone]  Fax: [phone]

## 2021-10-29 NOTE — ED Notes (Signed)
Pt readjusted in bed, vitals are stable, pt is a/ox4, denies any needs at this time

## 2021-10-29 NOTE — ED Notes (Signed)
Pt sat up to eat lunch

## 2021-10-29 NOTE — TOC Benefit Eligibility Note (Signed)
Patient Advocate Encounter   Received notification that prior authorization for Valtoco 15 MG Dose 7.'5MG'$ /0.1ML liquid is required.   PA submitted on 10/29/2021 Key [id number] Status is pending       Lyndel Safe, [place] Patient Advocate Specialist [place] Patient Advocate Team Direct Number: [phone]  Fax: [phone]

## 2021-10-29 NOTE — Plan of Care (Signed)
  Problem: Medication: Goal: Risk for medication side effects will decrease Outcome: Progressing   Problem: Safety: Goal: Verbalization of understanding the information provided will improve Outcome: Progressing   Problem: Self-Concept: Goal: Level of anxiety will decrease Outcome: Progressing

## 2021-10-29 NOTE — Progress Notes (Addendum)
Subjective: No further clinical seizures overnight.  Patient states she has some speech difficulty since her prior stroke.  Denies any other new concerns.  ROS: negative except above  Examination  Vital signs in last 24 hours: Temp:  [97.7 F (36.5 C)] 97.7 F (36.5 C) (06/07 1552) Pulse Rate:  [58-81] 73 (06/08 1000) Resp:  [13-22] 18 (06/08 1000) BP: (104-161)/(60-88) 125/76 (06/08 1000) SpO2:  [94 %-100 %] 96 % (06/08 1000) Weight:  [73.8 kg] 73.8 kg (06/08 0734)  General: lying in bed, NAD Neuro: AOx3, able to follow commands, able to name objects but with a decreased fluency, cranial nerves II through XII grossly intact except decreased visual fields on the right, decreased sensation to touch on the right side, 5/5 in all 4 extremities  Basic Metabolic Panel: Recent Labs  Lab 10/28/21 1600 10/28/21 1616 10/29/21 0539  NA 137 137 139  K 4.4 4.6 4.8  CL 106 107 108  CO2 19*  --  21*  GLUCOSE 145* 141* 136*  BUN 30* 33* 28*  CREATININE 1.88* 2.00* 1.70*  CALCIUM 8.9  --  8.8*    CBC: Recent Labs  Lab 10/28/21 1600 10/28/21 1616  WBC 10.3  --   NEUTROABS 5.7  --   HGB 13.6 13.9  HCT 41.9 41.0  MCV 91.9  --   PLT 323  --      Coagulation Studies: Recent Labs    10/28/21 1600  LABPROT 13.1  INR 1.0    Imaging CT head without contrast 10/28/2021: No acute intracranial abnormality. Expected evolution of left parietal infarct. Moderate white matter disease likely reflects the sequela of chronic microvascular ischemia.  CT angio head and neck with and without contrast 10/28/2021: No large vessel occlusion. Perfusion imaging demonstrates no evidence of core infarction or penumbra, but there is exclusion of a portion of the left cerebral hemisphere from the data. Marked stenosis of the left vertebral origin without apparent flow limitation. Similar intracranial atherosclerosis.  ASSESSMENT AND PLAN: 47 old female with chronic left parietal infarct presented with  focal convulsive status epilepticus which has since resolved.  Focal convulsive status epilepticus, resolved Chronic stroke -No further seizures overnight.  EEG showed evidence of epileptogenicity and cortical dysfunction in the left temporal region.  Recommendations -Continue Keppra 500 mg twice daily -Discontinue LTM EEG as no further seizures overnight - MRI brain without contrast to look for acute abnormality -Seizure precautions -Intranasal valtoco 7.'5mg'$ in each nostril ( total '15mg'$ ) to be used for grand mal seizure lasting over 5 minutes. Can repeat once in 4 hours. Do Not use more than twice in 24 hours.  Please check with case manager if this is approved by patient's insurance.  If not, we will need to prescribe oral clonazepam wafers 1 mg for tonic-clonic like seizure lasting more than 5 minutes -Follow-up with neurology in 3 months -Discussed plan with medicine team  Seizure precautions: Per [facility] [place] statutes, patients with seizures are not allowed to drive until they have been seizure-free for six months and cleared by a physician    Use caution when using heavy equipment or power tools. Avoid working on ladders or at heights. Take showers instead of baths. Ensure the water temperature is not too high on the home water heater. Do not go swimming alone. Do not lock yourself in a room alone (i.e. bathroom). When caring for infants or small children, sit down when holding, feeding, or changing them to minimize risk of injury to the child  in the event you have a seizure. Maintain good sleep hygiene. Avoid alcohol.    If patient has another seizure, call 911 and bring them back to the ED if: A.  The seizure lasts longer than 5 minutes.      B.  The patient doesn't wake shortly after the seizure or has new problems such as difficulty seeing, speaking or moving following the seizure C.  The patient was injured during the seizure D.  The patient has a temperature over 102 F  (39C) E.  The patient vomited during the seizure and now is having trouble breathing    During the Seizure   - First, ensure adequate ventilation and place patients on the floor on their left side  Loosen clothing around the neck and ensure the airway is patent. If the patient is clenching the teeth, do not force the mouth open with any object as this can cause severe damage - Remove all items from the surrounding that can be hazardous. The patient may be oblivious to what's happening and may not even know what he or she is doing. If the patient is confused and wandering, either gently guide him/her away and block access to outside areas - Reassure the individual and be comforting - Call 911. In most cases, the seizure ends before EMS arrives. However, there are cases when seizures may last over 3 to 5 minutes. Or the individual may have developed breathing difficulties or severe injuries. If a pregnant patient or a person with diabetes develops a seizure, it is prudent to call an ambulance. - Finally, if the patient does not regain full consciousness, then call EMS. Most patients will remain confused for about 45 to 90 minutes after a seizure, so you must use judgment in calling for help.     After the Seizure (Postictal Stage)   After a seizure, most patients experience confusion, fatigue, muscle pain and/or a headache. Thus, one should permit the individual to sleep. For the next few days, reassurance is essential. Being calm and helping reorient the person is also of importance.   Most seizures are painless and end spontaneously. Seizures are not harmful to others but can lead to complications such as stress on the lungs, brain and the heart. Individuals with prior lung problems may develop labored breathing and respiratory distress.     I have spent a total of   38 minutes with the patient reviewing hospital notes,  test results, labs and examining the patient as well as establishing an  assessment and plan that was discussed personally with the patient.  > 50% of time was spent in direct patient care.   Zeb Comfort Epilepsy [facility] For questions after 5pm please refer to AMION to reach the Neurologist on call

## 2021-10-29 NOTE — Progress Notes (Signed)
PT Cancellation Note  Patient Details Name: Lisa Sutton MRN: [id number] DOB: July 18, 1933   Cancelled Treatment:    Reason Eval/Treat Not Completed: Patient at procedure or test/unavailable Per RN, pt currently attached to EEG. Will follow up as schedule allows.   Lou Miner, DPT  Acute Rehabilitation Services  Office: [phone]    Rudean Hitt 10/29/2021, 11:33 AM

## 2021-10-29 NOTE — Care Management Obs Status (Signed)
[facility] NOTIFICATION   Patient Details  Name: Lisa Sutton MRN: [id number] Date of Birth: 01-02-1934   Medicare Observation Status Notification Given:  Yes Permission to sign    Verdell Carmine, RN 10/29/2021, 2:47 PM

## 2021-10-29 NOTE — TOC Benefit Eligibility Note (Signed)
Patient Advocate Encounter  Prior Authorization for Valtoco 15 MG Dose 7.'5MG'$ /0.1ML liquid has been approved.    PA# [id number] Effective dates: 10/29/2021 through 05/23/2022  Patients co-pay is $233.18.     Lyndel Safe, [facility] Patient Advocate Specialist [facility] Patient Advocate Team Direct Number: [phone]  Fax: [phone]

## 2021-10-29 NOTE — Progress Notes (Signed)
HD#0 Subjective:  Overnight Events: none  Patient assessed at bedside in ED. She states that she feels well. She was able to eat breakfast this morning.   Pt is updated on the plan for today, and all questions and concerns are addressed.   Objective:  Vital signs in last 24 hours: Vitals:   10/28/21 2330 10/29/21 0000 10/29/21 0030 10/29/21 0230  BP: 104/66 125/61 128/77 135/73  Pulse: 64 66 65 65  Resp: '13 15 17 15  '$ Temp:      TempSrc:      SpO2: 96% 94% 96% 97%   Supplemental O2: Room Air SpO2: 97 %   Physical Exam:  Constitutional: well-appearing, in no acute distress HENT: laceration with bruising present at tip of tongue Cardiovascular: regular rate and rhythm, no m/r/g Pulmonary/Chest: normal work of breathing on room air, lungs clear to auscultation bilaterally Abdominal: soft, non-tender, non-distended Neurological:  Mental Status: Patient is awake, alert, oriented x3  No signs of aphasia or neglect Cranial Nerves: II: Pupils equal, round, and reactive to light.   III,IV, VI: EOMI without ptosis or diploplia.  V: Facial sensation is symmetric to light touch and temperature. VII: Facial movement is symmetric.  VIII: Hearing is intact to voice X: Uvula elevates symmetrically XI: Shoulder shrug is symmetric. XII: Tongue is midline without atrophy or fasciculations.  Motor: effort thorughout, at least 5/5 bilateral UE, 5/5 in left lower extremity and 4/5 in right lower extremity Sensory: Sensation is grossly intact, bilateral UE & LE  There were no vitals filed for this visit.  No intake or output data in the 24 hours ending 10/29/21 0659 Net IO Since Admission: No IO data has been entered for this period [10/29/21 0659]  Pertinent Labs:    Latest Ref Rng & Units 10/28/2021    4:16 PM 10/28/2021    4:00 PM 03/12/2021    1:50 AM  CBC  WBC 4.0 - 10.5 K/uL  10.3  8.6   Hemoglobin 12.0 - 15.0 g/dL 13.9  13.6  12.8   Hematocrit 36.0 - 46.0 % 41.0  41.9   38.9   Platelets 150 - 400 K/uL  323  270        Latest Ref Rng & Units 10/29/2021    5:39 AM 10/28/2021    4:16 PM 10/28/2021    4:00 PM  CMP  Glucose 70 - 99 mg/dL 136  141  145   BUN 8 - 23 mg/dL 28  33  30   Creatinine 0.44 - 1.00 mg/dL 1.70  2.00  1.88   Sodium 135 - 145 mmol/L 139  137  137   Potassium 3.5 - 5.1 mmol/L 4.8  4.6  4.4   Chloride 98 - 111 mmol/L 108  107  106   CO2 22 - 32 mmol/L 21   19   Calcium 8.9 - 10.3 mg/dL 8.8   8.9   Total Protein 6.5 - 8.1 g/dL   7.0   Total Bilirubin 0.3 - 1.2 mg/dL   0.5   Alkaline Phos 38 - 126 U/L   102   AST 15 - 41 U/L   19   ALT 0 - 44 U/L   13     Imaging: EEG adult  Result Date: 10/28/2021 Lora Havens, MD     10/28/2021 11:07 PM Patient Name: Lisa Sutton MRN: [id number] Epilepsy Attending: Lora Havens Referring Physician/Provider: Janine Ores, NP Date: 10/28/2021 Duration: 23.01 mins Patient history: 88yo  F presented with sudden onset of right leg twitching's initially subsequently involving the right face as well and was called a code stroke on exam she was clearly having focal seizures continuously. EEG to evaluate for seizure Level of alertness:  lethargic AEDs during EEG study: LEV, Ativan Technical aspects: This EEG study was done with scalp electrodes positioned according to the 10-20 International system of electrode placement. Electrical activity was acquired at a sampling rate of '500Hz'$  and reviewed with a high frequency filter of '70Hz'$  and a low frequency filter of '1Hz'$ . EEG data were recorded continuously and digitally stored. Description: No posterior dominant rhythm was seen. EEG showed continuous generalized and maximal left temporal 3 to 6 Hz theta-delta slowing admixed with an excessive amount of 15 to 18 Hz beta activity distributed symmetrically and diffusely. Spike was noted in left temporal  region. Hyperventilation and photic stimulation were not performed.   ABNORMALITY - Spike, left temporal region. -  Continuous slow, generalized and maximal left temporal region - Excessive beta, generalized IMPRESSION: This study showed evidence of epileptogenicity and cortical dysfunction arising from left temporal region likely due to underlying stroke. Additionally there is moderate to severe diffuse encephalopathy, nonspecific etiology. The excessive beta activity is likely due to benzodiapine use and is a benign EEG pattern. No seizures were seen throughout the recording. Dr Curly Shores was notified. [name]   CT ANGIO HEAD NECK W WO CM W PERF (CODE STROKE)  Result Date: 10/28/2021 CLINICAL DATA:  Stroke, follow up EXAM: CT ANGIOGRAPHY HEAD AND NECK CT PERFUSION BRAIN TECHNIQUE: Multidetector CT imaging of the head and neck was performed using the standard protocol during bolus administration of intravenous contrast. Multiplanar CT image reconstructions and MIPs were obtained to evaluate the vascular anatomy. Carotid stenosis measurements (when applicable) are obtained utilizing NASCET criteria, using the distal internal carotid diameter as the denominator. Multiphase CT imaging of the brain was performed following IV bolus contrast injection. Subsequent parametric perfusion maps were calculated using RAPID software. RADIATION DOSE REDUCTION: This exam was performed according to the departmental dose-optimization program which includes automated exposure control, adjustment of the mA and/or kV according to patient size and/or use of iterative reconstruction technique. CONTRAST:  166m OMNIPAQUE IOHEXOL 350 MG/ML SOLN COMPARISON:  October 2022 FINDINGS: CTA NECK Aortic arch: Mild mixed plaque. Great vessel origins are patent. Mixed plaque along the proximal left subclavian with less than 50% stenosis. Right carotid system: Patent.  No stenosis. Left carotid system: Patent. Minimal calcified plaque at the bifurcation. No stenosis. Vertebral arteries: Patent. Mixed plaque causing marked stenosis of the left vertebral  origin without apparent flow limitation. Skeleton: Cervical spine degenerative changes. Other neck: Unremarkable. Upper chest: Emphysema. Review of the MIP images confirms the above findings CTA HEAD Anterior circulation: Intracranial internal carotid arteries are patent with calcified plaque causing up to mild stenosis. Anterior and middle cerebral arteries are patent. There is atherosclerotic irregularity and mild stenoses. Posterior circulation: Intracranial vertebral arteries are patent. Noncalcified plaque on the left causes mild stenosis. Basilar artery is patent. Major cerebellar artery origins are patent. Posterior cerebral arteries are patent. There is atherosclerotic irregularity including moderate stenosis of the right P2 PCA. Venous sinuses: Patent as allowed by contrast bolus timing. Review of the MIP images confirms the above findings CT Brain Perfusion Findings: CBF (<30%) Volume: 025mPerfusion (Tmax>6.0s) volume: 68m75mismatch Volume: 68mL368mfarction Location: None. IMPRESSION: No large vessel occlusion. Perfusion imaging demonstrates no evidence of core infarction or penumbra, but there is exclusion  of a portion of the left cerebral hemisphere from the data. Marked stenosis of the left vertebral origin without apparent flow limitation. Similar intracranial atherosclerosis. Electronically Signed   By: Macy Mis M.D.   On: 10/28/2021 17:13   CT HEAD CODE STROKE WO CONTRAST  Result Date: 10/28/2021 CLINICAL DATA:  Code stroke. Neuro deficit, acute, stroke suspected. Right-sided numbness. EXAM: CT HEAD WITHOUT CONTRAST TECHNIQUE: Contiguous axial images were obtained from the base of the skull through the vertex without intravenous contrast. RADIATION DOSE REDUCTION: This exam was performed according to the departmental dose-optimization program which includes automated exposure control, adjustment of the mA and/or kV according to patient size and/or use of iterative reconstruction technique.  COMPARISON:  CT head without contrast 03/07/2021. MR head without contrast 03/08/2021 FINDINGS: Brain: Remote left parietal infarct noted. Moderate white matter disease is present bilaterally. Basal ganglia are intact. No other acute cortical abnormality is present. No acute infarct, hemorrhage, or mass lesion is present. Ventricles are of normal size. No significant extraaxial fluid collection is present. The brainstem and cerebellum are within normal limits. Vascular: Atherosclerotic calcifications are present within the cavernous internal carotid arteries and at the dural margin of both vertebral arteries. No hyperdense vessel is present. Skull: Calvarium is intact. No focal lytic or blastic lesions are present. No significant extracranial soft tissue lesion is present. Sinuses/Orbits: Chronic soft tissue thickening is present the inferior right maxillary sinus. The paranasal sinuses and mastoid air cells are otherwise clear. Bilateral lens replacements are noted. Globes and orbits are otherwise unremarkable. Other: ASPECTS ([place] Stroke Program Early CT Score) - Ganglionic level infarction (caudate, lentiform nuclei, internal capsule, insula, M1-M3 cortex): 7/7 - Supraganglionic infarction (M4-M6 cortex): 3/3 Total score (0-10 with 10 being normal): 10/10 IMPRESSION: 1. No acute intracranial abnormality. 2. Expected evolution of left parietal infarct. 3. Moderate white matter disease likely reflects the sequela of chronic microvascular ischemia. Electronically Signed   By: San Morelle M.D.   On: 10/28/2021 16:18    Assessment/Plan:   Principal Problem:   New onset seizure [facility])   Patient Summary: REKITA MIOTKE is a 88 y.o. with a pertinent PMH of  CVA with residual right side weakness, hypertension, hyperlipidemia, well-controlled type 2 diabetes, who presented with right arm numbness and admitted on 6/7 for new onset seizures on HD#1.   New onset seizure Prior left MCA stroke s/p  mechanical thrombectomy with residual r homonymous hemianopsia and right hand weakness EEG showed showed evidence of left echogenicity and cortical dysfunction from the left temporal region.  This is thought to be due to underlying strokes in that region.  MRI is pending.  Appreciate neurology recommendations.  UDS negative. LDL <70 on rosuvastatin. Spoke with neurology and once MRI is completed she will be able to be discharged. At discharge she will continue on Keppra 500 mg BID and needs to have intranasal valtoco 7.5 mg or oral clonazepam wafers in cause she has seizure that lasts more than one minute. Pharmacy completed prior auth, and co-pay will be $237. I talked with her son and tried to call pharmacy they use to check about clonazepam wafer pricing but was not able to reach pharmacist.  -continue Keppra 500 mg PO BID -Seizure precautions -MRI when able -telemetry -PT/OT eval -ASA 81 mg   Well-controlled Type II diabetes mellitus Last hemoglobin A1c was on October 2022 at 6.2.  She does not take medications for her diabetes and this is managed through diet. -SSI   Hyperlipidemia Medications  include rosuvastatin 40 mg.  LDL at 30.  -Continue rosuvastatin 40 mg   Hypertension Initially hypertensive on arrival at 160/89. -continue amlodipine 10 mg qd -continue atenolol 25 mg   History of dysphagia Followed with GI in the past for esophageal stricture that was dilated in March 2021. Bedside swallow completed. Patient has previously worked with SLP for language issues but son states that she does not have problems with swallowing foods at home. -CM diet   Hypothyroidism Home medication includes levothyroxine 125 mcg.  -continue levothyroxine -TSH in AM  Diet: Carb-Modified VTE: Enoxaparin Code: Full PT/OT recs: Pending. Family Update: Joey called and updated.   Dispo: Anticipated discharge to Home in 1 days pending MRI.   Hinata Diener M. Worthington Cruzan, D.O.  Internal Medicine Resident,  PGY-1 [facility]  Pager: [phone] 6:59 AM, 10/29/2021   **Please contact the on call pager after 5 pm and on weekends at [phone].**

## 2021-10-29 NOTE — Progress Notes (Signed)
vLTM discontinued  no skin breakdown at all skin sites

## 2021-10-29 NOTE — Evaluation (Signed)
Occupational Therapy Evaluation Patient Details Name: Lisa Sutton MRN: [id number] DOB: Dec 25, 1933 Today's Date: 10/29/2021   History of Present Illness Pt is an 88 y/o female admitted secondary to seizures. EEG showed evidence of epileptogenicity and cortical dysfunction arising from left temporal region likely due to underlying stroke. Awaiting MRI. PMH includes CVA with R deficits and aphasia, Hep B, and DM.   Clinical Impression   Pt admitted for concerns listed above. PTA pt reported that she was fairly independent with all ADL's and functional mobility. Since her recent CVA, pt's son reports her balance has been mildly impaired, as well as R sided field cut, and mild cognitive deficits. At this time, pt presents with increased ataxia in BUE, RUE>LUE, as well as motor planning deficits. When asked to grab an item in front of her, pt lifts her feet and her hands go to her head. Recommending HHOT and a [place], with plans to switch to OP OT as pt's strength and endurance improve. OT will follow acutely.      Recommendations for follow up therapy are one component of a multi-disciplinary discharge planning process, led by the attending physician.  Recommendations may be updated based on patient status, additional functional criteria and insurance authorization.   Follow Up Recommendations  Home health OT (or OP if family agrees)    Assistance Recommended at Discharge Intermittent Supervision/Assistance  Patient can return home with the following A little help with walking and/or transfers;A lot of help with bathing/dressing/bathroom;Assistance with cooking/housework;Assistance with feeding;Direct supervision/assist for medications management;Direct supervision/assist for financial management;Assist for transportation;Help with stairs or ramp for entrance    Functional Status Assessment  Patient has had a recent decline in their functional status and demonstrates the ability to make  significant improvements in function in a reasonable and predictable amount of time.  Equipment Recommendations  None recommended by OT    Recommendations for Other Services       Precautions / Restrictions Precautions Precautions: Fall Restrictions Weight Bearing Restrictions: No      Mobility Bed Mobility Overal bed mobility: Needs Assistance Bed Mobility: Supine to Sit, Sit to Supine     Supine to sit: Min assist Sit to supine: Min assist, +2 for safety/equipment   General bed mobility comments: Min A for trunk elevation to come to sitting. Min A for LE assist for return to supine on higher stretcher height.    Transfers Overall transfer level: Needs assistance Equipment used: 1 person hand held assist Transfers: Sit to/from Stand Sit to Stand: Min guard, +2 safety/equipment           General transfer comment: Min guard for safety.      Balance Overall balance assessment: Needs assistance Sitting-balance support: No upper extremity supported, Feet supported Sitting balance-Leahy Scale: Good     Standing balance support: No upper extremity supported Standing balance-Leahy Scale: Fair                             ADL either performed or assessed with clinical judgement   ADL Overall ADL's : Needs assistance/impaired Eating/Feeding: Moderate assistance;Sitting   Grooming: Moderate assistance;Sitting   Upper Body Bathing: Moderate assistance;Sitting   Lower Body Bathing: Moderate assistance;Cueing for compensatory techniques;Sitting/lateral leans;Sit to/from stand   Upper Body Dressing : Moderate assistance;Sitting   Lower Body Dressing: Maximal assistance;Sitting/lateral leans;Sit to/from stand   Toilet Transfer: Minimal assistance;Ambulation   Toileting- Clothing Manipulation and Hygiene: Minimal assistance;Sitting/lateral lean;Sit to/from  stand       Functional mobility during ADLs: Minimal assistance General ADL Comments: Pt limited  in ADL participation     Vision Baseline Vision/History: 2 Legally blind Ability to See in Adequate Light: 2 Moderately impaired Patient Visual Report: Peripheral vision impairment Vision Assessment?: Vision impaired- to be further tested in functional context Additional Comments: Vision does not appear to be further deficited, compared to her baseline.     Perception     Praxis      Pertinent Vitals/Pain Pain Assessment Pain Assessment: No/denies pain     Hand Dominance Right   Extremity/Trunk Assessment Upper Extremity Assessment Upper Extremity Assessment: RUE deficits/detail;LUE deficits/detail RUE Deficits / Details: Ataxia noted heavily, when asked to move her Hands she moved her feet. RUE<LUE, poor coordination globally. RUE Coordination: decreased fine motor;decreased gross motor LUE Deficits / Details: Ataxia noted heavily, when asked to move her Hands she moved her feet. RUE<LUE, poor coordination globally. LUE Coordination: decreased fine motor;decreased gross motor   Lower Extremity Assessment Lower Extremity Assessment: Defer to PT evaluation RLE Deficits / Details: Weakness at baseline.   Cervical / Trunk Assessment Cervical / Trunk Assessment: Normal   Communication Communication Communication: Expressive difficulties;HOH (expressive difficulty at baseline and son reports he feels she is close to baseline)   Cognition Arousal/Alertness: Awake/alert Behavior During Therapy: WFL for tasks assessed/performed Overall Cognitive Status: Difficult to assess                                 General Comments: Aphasic, but was able to communicate in complete sentences at times. Noted motor apraxia and difficulty sequencing when attempting MMT and coordination test.     General Comments  VSS on RA, son present and supportive    Exercises     Shoulder Instructions      Home Living Family/patient expects to be discharged to:: Private  residence Living Arrangements: Children Available Help at Discharge: Family Type of Home: House Home Access: Stairs to enter Technical brewer of Steps: 2 Entrance Stairs-Rails: Right;Left;Can reach both Home Layout: Two level;Able to live on main level with bedroom/bathroom     Bathroom Shower/Tub: Occupational psychologist: Standard     Home Equipment: Conservation officer, nature (2 wheels);Cane - single point;BSC/3in1;Shower seat - built in;Grab bars - tub/shower          Prior Functioning/Environment Prior Level of Function : Needs assist             Mobility Comments: Ambulates without AD ADLs Comments: Able to perform ADLs independently. Son cooks and drives        OT Problem List: Decreased strength;Impaired balance (sitting and/or standing);Decreased activity tolerance;Decreased safety awareness;Impaired sensation;Impaired UE functional use      OT Treatment/Interventions: Self-care/ADL training;Therapeutic exercise;Energy conservation;Neuromuscular education;DME and/or AE instruction;Therapeutic activities;Patient/family education;Balance training;Cognitive remediation/compensation;Visual/perceptual remediation/compensation    OT Goals(Current goals can be found in the care plan section) Acute Rehab OT Goals Patient Stated Goal: To go home OT Goal Formulation: With patient/family Time For Goal Achievement: 11/12/21 Potential to Achieve Goals: Good ADL Goals Pt Will Perform Eating: with supervision;with adaptive utensils;sitting Pt Will Perform Grooming: with set-up;with adaptive equipment;sitting Pt Will Perform Lower Body Bathing: sitting/lateral leans;sit to/from stand;with min guard assist Pt Will Perform Lower Body Dressing: with modified independence;sitting/lateral leans;sit to/from stand Pt/caregiver will Perform Home Exercise Program: Increased ROM;Increased strength;Both right and left upper extremity;With theraband;With theraputty;With written HEP  provided  Additional ADL Goal #1: Pt will complete fine motor ADL tasks with min A.  OT Frequency: Min 2X/week    Co-evaluation PT/OT/SLP Co-Evaluation/Treatment: Yes Reason for Co-Treatment: For patient/therapist safety;To address functional/ADL transfers PT goals addressed during session: Mobility/safety with mobility;Balance OT goals addressed during session: ADL's and self-care;Strengthening/ROM      AM-PAC OT "6 Clicks" Daily Activity     Outcome Measure Help from another person eating meals?: A Lot Help from another person taking care of personal grooming?: A Lot Help from another person toileting, which includes using toliet, bedpan, or urinal?: A Little Help from another person bathing (including washing, rinsing, drying)?: A Lot Help from another person to put on and taking off regular upper body clothing?: A Little Help from another person to put on and taking off regular lower body clothing?: A Lot 6 Click Score: 14   End of Session Equipment Utilized During Treatment: Gait belt Nurse Communication: Mobility status  Activity Tolerance: Patient tolerated treatment well Patient left: in bed;with call bell/phone within reach;with family/visitor present  OT Visit Diagnosis: Unsteadiness on feet (R26.81);Other abnormalities of gait and mobility (R26.89);Muscle weakness (generalized) (M62.81)                Time: 2641-5830 OT Time Calculation (min): 24 min Charges:  OT General Charges $OT Visit: 1 Visit OT Evaluation $OT Eval Moderate Complexity: [place]., OTR/L Acute Rehabilitation  Quanasia Defino Elane Yolanda Bonine 10/29/2021, 6:33 PM

## 2021-10-30 ENCOUNTER — Other Ambulatory Visit (HOSPITAL_COMMUNITY): Payer: Self-pay

## 2021-10-30 DIAGNOSIS — R569 Unspecified convulsions: Secondary | ICD-10-CM

## 2021-10-30 LAB — GLUCOSE, CAPILLARY
Glucose-Capillary: 155 mg/dL — ABNORMAL HIGH (ref 70–99)
Glucose-Capillary: 265 mg/dL — ABNORMAL HIGH (ref 70–99)

## 2021-10-30 MED ORDER — CLONAZEPAM 1 MG PO TBDP: ORAL_TABLET | ORAL | 0 refills | Status: DC

## 2021-10-30 MED ORDER — LEVETIRACETAM 500 MG PO TABS: 500.0000 mg | ORAL_TABLET | Freq: Two times a day (BID) | ORAL | 0 refills | Status: DC

## 2021-10-30 MED ORDER — CLOPIDOGREL BISULFATE 75 MG PO TABS: 75.0000 mg | ORAL_TABLET | Freq: Every day | ORAL | 0 refills | Status: DC

## 2021-10-30 MED ORDER — VALTOCO 5 MG DOSE 5 MG/0.1ML NA LIQD: NASAL | 0 refills | Status: DC

## 2021-10-30 MED ORDER — CLONAZEPAM 1 MG PO TBDP
ORAL_TABLET | ORAL | 0 refills | Status: DC
  Filled 2021-10-30 (×2): qty 15, 15d supply, fill #0

## 2021-10-30 NOTE — TOC Transition Note (Signed)
Transition of Care [facility]) - CM/SW Discharge Note   Patient Details  Name: Lisa Sutton MRN: [id number] Date of Birth: Jul 27, 1933  [facility] CM/SW Contact:  Pollie Friar, RN Phone Number: 10/30/2021, 1:44 PM   Clinical Narrative:    Patient is discharging home with home health services through [facility]. Information on the AVS.  Pt's son is with her most of the time and over sees her medications and provides needed transportation. Has: shower seat, cane, walker at home PCP: [facility]  Son will provide transport home today.   Final next level of care: Home w Home Health Services Barriers to Discharge: No Barriers Identified   Patient Goals and CMS Choice   CMS Medicare.gov Compare Post Acute Care list provided to:: Patient Choice offered to / list presented to : Patient, Adult Children  Discharge Placement                       Discharge Plan and Services                          HH Arranged: PT, OT [facility] Agency: [place] ([facility]) Date [place]: 10/30/21   Representative spoke with at [place]: [place] ([place]) Interventions     Readmission Risk Interventions     No data to display

## 2021-10-30 NOTE — Plan of Care (Signed)
  Problem: Education: Goal: Ability to describe self-care measures that may prevent or decrease complications (Diabetes Survival Skills Education) will improve Outcome: Adequate for Discharge Goal: Individualized Educational Video(s) Outcome: Adequate for Discharge   Problem: Coping: Goal: Ability to adjust to condition or change in health will improve Outcome: Adequate for Discharge   Problem: Fluid Volume: Goal: Ability to maintain a balanced intake and output will improve Outcome: Adequate for Discharge   Problem: Health Behavior/Discharge Planning: Goal: Ability to identify and utilize available resources and services will improve Outcome: Adequate for Discharge Goal: Ability to manage health-related needs will improve Outcome: Adequate for Discharge   Problem: Metabolic: Goal: Ability to maintain appropriate glucose levels will improve Outcome: Adequate for Discharge   Problem: Nutritional: Goal: Maintenance of adequate nutrition will improve Outcome: Adequate for Discharge Goal: Progress toward achieving an optimal weight will improve Outcome: Adequate for Discharge   Problem: Skin Integrity: Goal: Risk for impaired skin integrity will decrease Outcome: Adequate for Discharge   Problem: Tissue Perfusion: Goal: Adequacy of tissue perfusion will improve Outcome: Adequate for Discharge   Problem: Education: Goal: Knowledge of General Education information will improve Description: Including pain rating scale, medication(s)/side effects and non-pharmacologic comfort measures Outcome: Adequate for Discharge   Problem: Health Behavior/Discharge Planning: Goal: Ability to manage health-related needs will improve Outcome: Adequate for Discharge   Problem: Clinical Measurements: Goal: Ability to maintain clinical measurements within normal limits will improve Outcome: Adequate for Discharge Goal: Will remain free from infection Outcome: Adequate for Discharge Goal:  Diagnostic test results will improve Outcome: Adequate for Discharge Goal: Respiratory complications will improve Outcome: Adequate for Discharge Goal: Cardiovascular complication will be avoided Outcome: Adequate for Discharge   Problem: Activity: Goal: Risk for activity intolerance will decrease Outcome: Adequate for Discharge   Problem: Nutrition: Goal: Adequate nutrition will be maintained Outcome: Adequate for Discharge   Problem: Coping: Goal: Level of anxiety will decrease Outcome: Adequate for Discharge   Problem: Elimination: Goal: Will not experience complications related to bowel motility Outcome: Adequate for Discharge Goal: Will not experience complications related to urinary retention Outcome: Adequate for Discharge   Problem: Pain Managment: Goal: General experience of comfort will improve Outcome: Adequate for Discharge   Problem: Safety: Goal: Ability to remain free from injury will improve Outcome: Adequate for Discharge   Problem: Skin Integrity: Goal: Risk for impaired skin integrity will decrease Outcome: Adequate for Discharge   Problem: Education: Goal: Expressions of having a comfortable level of knowledge regarding the disease process will increase Outcome: Adequate for Discharge   Problem: Coping: Goal: Ability to adjust to condition or change in health will improve Outcome: Adequate for Discharge Goal: Ability to identify appropriate support needs will improve Outcome: Adequate for Discharge   Problem: Health Behavior/Discharge Planning: Goal: Compliance with prescribed medication regimen will improve Outcome: Adequate for Discharge   Problem: Medication: Goal: Risk for medication side effects will decrease Outcome: Adequate for Discharge   Problem: Clinical Measurements: Goal: Complications related to the disease process, condition or treatment will be avoided or minimized Outcome: Adequate for Discharge Goal: Diagnostic test  results will improve Outcome: Adequate for Discharge   Problem: Safety: Goal: Verbalization of understanding the information provided will improve Outcome: Adequate for Discharge   Problem: Self-Concept: Goal: Level of anxiety will decrease Outcome: Adequate for Discharge Goal: Ability to verbalize feelings about condition will improve Outcome: Adequate for Discharge

## 2021-10-30 NOTE — Progress Notes (Addendum)
Discharge instructions given to patient and patients son. Both understand instructions. Pt discharged home safely. TOC pharmacy unable to fill prescription for clonazepam ODT. Pt primary pharmacy [facility] does not have medication in stock. Medication in [facility] at [facility] [place], [place] made aware and [facility] to [facility] [place] for [facility]epam ODT.

## 2021-10-30 NOTE — Discharge Summary (Addendum)
Name: Lisa Sutton MRN: [id number] DOB: [birth year] 88 y.o. PCP: Pcp, No  Date of Admission: 10/28/2021  3:54 PM Date of Discharge: 10/30/21 Attending Physician: Dr. Daryll Drown  Discharge Diagnosis: Principal Problem:   New onset seizure [facility])    Discharge Medications: Allergies as of 10/30/2021       Reactions   Statins Other (See Comments)   'discomfort, aching everywhere' Tolerates to livalo    Other    Allergic to diabetic medication but unsure of name. Went to ED February 2023 (ED note I do not see name of med)    Zetia [ezetimibe] Other (See Comments)   Leg pain   Zocor [simvastatin] Other (See Comments)   Leg pain        Medication List     TAKE these medications    amLODipine 10 MG tablet Commonly known as: NORVASC Take 10 mg by mouth daily.   aspirin EC 81 MG tablet Take 81 mg by mouth daily. Swallow whole.   atenolol 25 MG tablet Commonly known as: TENORMIN Take 1 tablet (25 mg total) by mouth daily.   clonazePAM 1 MG disintegrating tablet Commonly known as: KLONOPIN Use if seizure lasts longer than 5 minutes   clopidogrel 75 MG tablet Commonly known as: Plavix Take 1 tablet (75 mg total) by mouth daily.   diphenhydrAMINE 25 MG tablet Commonly known as: BENADRYL Take 1 tablet (25 mg total) by mouth every 6 (six) hours as needed. What changed: reasons to take this   hydrOXYzine 50 MG tablet Commonly known as: ATARAX Take 50 mg by mouth at bedtime as needed for itching or anxiety.   levETIRAcetam 500 MG tablet Commonly known as: KEPPRA Take 1 tablet (500 mg total) by mouth 2 (two) times daily.   levothyroxine 125 MCG tablet Commonly known as: SYNTHROID Take 1 tablet (125 mcg total) by mouth daily before breakfast.   losartan 100 MG tablet Commonly known as: COZAAR Take 100 mg by mouth daily.   meclizine 25 MG tablet Commonly known as: ANTIVERT Take 25 mg by mouth 2 (two) times daily as needed for dizziness.   metFORMIN 500 MG 24 hr  tablet Commonly known as: GLUCOPHAGE-XR Take 500 mg by mouth daily.   pantoprazole 40 MG tablet Commonly known as: PROTONIX Take 1 tablet (40 mg total) by mouth daily before lunch.   rosuvastatin 40 MG tablet Commonly known as: CRESTOR Take 40 mg by mouth at bedtime.   triamcinolone cream 0.1 % Commonly known as: KENALOG Apply 1 application. topically daily as needed (irritation).   Valtoco 5 MG Dose 5 MG/0.1ML Liqd Generic drug: diazePAM Spray in nose if seizure last more than 5 minutes. Can repeat dose in 4 hours. Do not use more than twice in 24 hours.        Disposition and follow-up:   [name] was discharged from [facility] in Stable condition.  At the hospital follow up visit please address:  1.  Follow-up: a. New onset seizure- from prior stroke. Started on Keppra BID. She will have clonazepam wafer or valtoco as needed if seizure lasts longer than 5 minutes.   b. MRI showed subacute stroke- neurology recommended Plavix for 3 months (Sept 9th) and to follow-up with them in September.   2.  Labs / imaging needed at time of follow-up: BMP  3.  Pending labs/ test needing follow-up: none  4.  Medication Changes  Started:   1. Keppra   2. Clonazepam 1  mg wafer or valtoco if co-pay is able to be covered ($102 co-pay)  Follow-up Appointments:  Follow-up Information     [facility] Follow up.   Why: [phone] The home health agency will contact you for the first home visit                Hospital Course by problem list: New onset seizure Prior left MCA stroke s/p mechanical thrombectomy with residual r homonymous hemianopsia and right hand weakness Patient presented with right arm twitching of right leg, and dysarthria.  Code stroke was activated and was evaluated by neurologist concerning for seizure activity with focal status epilepticus.  She was given multiple doses of Ativan with resolution of most symptoms.   CT angio head and neck showed no large vessel occlusion or perfusion defect.  MRI showed subacute infarct in left frontal region.  EEG showed cortical dysfunction in left region of brain.  Seizures likely due to prior strokes.  She was started on Keppra 500 mg twice daily.  Was discharged with clonazepam wafer to use if seizure lasted longer than 5 minutes.  She will follow-up with neurology in 3 months.  Subacute left posterior inferior frontal lobe infarct Seen on MRI.  Neurology recommends clopidogrel 75 mg once daily for 3 months.  Patient's cholesterol is well controlled with LDL less than 70 and her diabetes is also well controlled.   Well-controlled Type II diabetes mellitus Last hemoglobin A1c was on October 2022 at 6.2.  She does not take medications for her diabetes and this is managed through diet.   Hyperlipidemia Medications include rosuvastatin 40 mg.  LDL less than 70.   Hypertension Initially hypertensive on arrival at 160/89.   Hypothyroidism Home medication includes levothyroxine 125 mcg.   Discharge Subjective: Patient assessed at bedside this AM. She states that she feels a little weaker on right side than before event. She was able to tolerate breakfast ok this AM. We talked about results of MRI showing subacute stroke. Son was at bed side and we discussed medication changes.  Discharge Exam:   BP 117/69 (BP Location: Right Arm)   Pulse (!) 59   Temp 97.7 F (36.5 C) (Oral)   Resp 18   Ht '5\' 3"'$  (1.6 m)   Wt 73.8 kg   SpO2 95%   BMI 28.82 kg/m  Constitutional: well-appearing, sitting up in bed, in NAD HENT: bruising at tip of tongue Cardiovascular: regular rate and rhythm, no m/r/g Pulmonary/Chest: normal work of breathing on room air, lungs clear to auscultation bilaterally Abdominal: soft, non-tender, non-distended MSK: normal bulk and tone Neurological: alert & oriented x 3, 4/5 strength in right lower extremity, 5/5 strength in upper and left lower  extremity Skin: warm and dry Psych: normal mood and affect   Pertinent Labs, Studies, and Procedures:     Latest Ref Rng & Units 10/28/2021    4:16 PM 10/28/2021    4:00 PM 03/12/2021    1:50 AM  CBC  WBC 4.0 - 10.5 K/uL  10.3  8.6   Hemoglobin 12.0 - 15.0 g/dL 13.9  13.6  12.8   Hematocrit 36.0 - 46.0 % 41.0  41.9  38.9   Platelets 150 - 400 K/uL  323  270        Latest Ref Rng & Units 10/29/2021    5:39 AM 10/28/2021    4:16 PM 10/28/2021    4:00 PM  CMP  Glucose 70 - 99 mg/dL 136  141  145   BUN 8 - 23 mg/dL 28  33  30   Creatinine 0.44 - 1.00 mg/dL 1.70  2.00  1.88   Sodium 135 - 145 mmol/L 139  137  137   Potassium 3.5 - 5.1 mmol/L 4.8  4.6  4.4   Chloride 98 - 111 mmol/L 108  107  106   CO2 22 - 32 mmol/L 21   19   Calcium 8.9 - 10.3 mg/dL 8.8   8.9   Total Protein 6.5 - 8.1 g/dL   7.0   Total Bilirubin 0.3 - 1.2 mg/dL   0.5   Alkaline Phos 38 - 126 U/L   102   AST 15 - 41 U/L   19   ALT 0 - 44 U/L   13     MR BRAIN WO CONTRAST  Result Date: 10/29/2021 CLINICAL DATA:  Seizure prior CVA right-sided weakness and aphasia EXAM: MRI HEAD WITHOUT CONTRAST TECHNIQUE: Multiplanar, multiecho pulse sequences of the brain and surrounding structures were obtained without intravenous contrast. COMPARISON:  03/08/2021 MRI, correlation is also made with CT head 10/28/2021 FINDINGS: Brain: 2 small foci of restricted diffusion with possible mild ADC correlates in the left posteroinferior frontal lobe (series 3, image 33 and series 300, image 33). This area was likely involved in the 03/08/2021 infarct; these foci of restricted diffusion could reflect additional acute/subacute infarcts. No acute hemorrhage, mass, mass effect, or midline shift. Encephalomalacia and gliosis in the posterior left frontal lobe and left parietal lobe persistent with sequela of the prior infarct. Confluent T2 hyperintense signal in the periventricular white matter, likely the sequela of moderate to severe chronic small  vessel ischemic disease. Vascular: Normal arterial flow voids. Skull and upper cervical spine: Normal marrow signal. Sinuses/Orbits: No acute finding. Status post bilateral lens replacements. Other: Trace fluid in the mastoid air cells. IMPRESSION: Two small foci of restricted diffusion, with mild ADC correlates, in the left posteroinferior frontal lobe at the anterior aspect of an area that was involved in the infarcts seen on 03/08/2021 MRI. These are favored to represent additional subacute infarcts. These results will be called to the ordering clinician or representative by the Radiologist Assistant, and communication documented in the PACS or [employer]. Electronically Signed   By: Merilyn Baba M.D.   On: 10/29/2021 19:03   Overnight EEG with video  Result Date: 10/29/2021 Lora Havens, MD     10/29/2021 12:09 PM Patient Name: ARNEISHA KINCANNON MRN: [id number] Epilepsy Attending: Lora Havens Referring Physician/Provider: Janine Ores, NP Duration: 10/28/2021 2252 to 10/29/2021 1140  Patient history: 88yo F presented with sudden onset of right leg twitching's initially subsequently involving the right face as well and was called a code stroke on exam she was clearly having focal seizures continuously. EEG to evaluate for seizure  Level of alertness: awake, asleep  AEDs during EEG study: LEV  Technical aspects: This EEG study was done with scalp electrodes positioned according to the 10-20 International system of electrode placement. Electrical activity was acquired at a sampling rate of '500Hz'$  and reviewed with a high frequency filter of '70Hz'$  and a low frequency filter of '1Hz'$ . EEG data were recorded continuously and digitally stored.  Description: At the beginning of the study, EEG showed continuous generalized and maximal left temporal 3 to 6 Hz theta-delta slowing admixed with an excessive amount of 15 to 18 Hz beta activity distributed symmetrically and diffusely. Gradually, EEG improved and  showed posterior dominant rhythm of 9-10  Hz activity of moderate voltage (25-35 uV) seen predominantly in posterior head regions, symmetric and reactive to eye opening and eye closing. Sleep was characterized by vertex waves, sleep spindles (12 to 14 Hz), maximal frontocentral region. EEG also showed intermittent left temporal 3 to 5 Hz theta-delta slowing. Sharp waves was noted in left temporal  region. Hyperventilation and photic stimulation were not performed.    ABNORMALITY - Sharp wave, left temporal region. -Intermittent slow, left temporal region IMPRESSION: At the beginning of the study, EEG was suggestive of moderate to severe diffuse encephalopathy which gradually improved. EEG then showed evidence of epileptogenicity and cortical dysfunction arising from left temporal region likely due to underlying stroke. Lora Havens   EEG adult  Result Date: 10/28/2021 Lora Havens, MD     10/28/2021 11:07 PM Patient Name: SHEMEKIA PATANE MRN: [id number] Epilepsy Attending: Lora Havens Referring Physician/Provider: Janine Ores, NP Date: 10/28/2021 Duration: 23.01 mins Patient history: 88yo F presented with sudden onset of right leg twitching's initially subsequently involving the right face as well and was called a code stroke on exam she was clearly having focal seizures continuously. EEG to evaluate for seizure Level of alertness:  lethargic AEDs during EEG study: LEV, Ativan Technical aspects: This EEG study was done with scalp electrodes positioned according to the 10-20 International system of electrode placement. Electrical activity was acquired at a sampling rate of '500Hz'$  and reviewed with a high frequency filter of '70Hz'$  and a low frequency filter of '1Hz'$ . EEG data were recorded continuously and digitally stored. Description: No posterior dominant rhythm was seen. EEG showed continuous generalized and maximal left temporal 3 to 6 Hz theta-delta slowing admixed with an excessive amount of 15 to 18  Hz beta activity distributed symmetrically and diffusely. Spike was noted in left temporal  region. Hyperventilation and photic stimulation were not performed.   ABNORMALITY - Spike, left temporal region. - Continuous slow, generalized and maximal left temporal region - Excessive beta, generalized IMPRESSION: This study showed evidence of epileptogenicity and cortical dysfunction arising from left temporal region likely due to underlying stroke. Additionally there is moderate to severe diffuse encephalopathy, nonspecific etiology. The excessive beta activity is likely due to benzodiapine use and is a benign EEG pattern. No seizures were seen throughout the recording. Dr Curly Shores was notified. [name]   CT ANGIO HEAD NECK W WO CM W PERF (CODE STROKE)  Result Date: 10/28/2021 CLINICAL DATA:  Stroke, follow up EXAM: CT ANGIOGRAPHY HEAD AND NECK CT PERFUSION BRAIN TECHNIQUE: Multidetector CT imaging of the head and neck was performed using the standard protocol during bolus administration of intravenous contrast. Multiplanar CT image reconstructions and MIPs were obtained to evaluate the vascular anatomy. Carotid stenosis measurements (when applicable) are obtained utilizing NASCET criteria, using the distal internal carotid diameter as the denominator. Multiphase CT imaging of the brain was performed following IV bolus contrast injection. Subsequent parametric perfusion maps were calculated using RAPID software. RADIATION DOSE REDUCTION: This exam was performed according to the departmental dose-optimization program which includes automated exposure control, adjustment of the mA and/or kV according to patient size and/or use of iterative reconstruction technique. CONTRAST:  189m OMNIPAQUE IOHEXOL 350 MG/ML SOLN COMPARISON:  October 2022 FINDINGS: CTA NECK Aortic arch: Mild mixed plaque. Great vessel origins are patent. Mixed plaque along the proximal left subclavian with less than 50% stenosis. Right carotid  system: Patent.  No stenosis. Left carotid system: Patent. Minimal calcified plaque at the bifurcation. No stenosis. Vertebral  arteries: Patent. Mixed plaque causing marked stenosis of the left vertebral origin without apparent flow limitation. Skeleton: Cervical spine degenerative changes. Other neck: Unremarkable. Upper chest: Emphysema. Review of the MIP images confirms the above findings CTA HEAD Anterior circulation: Intracranial internal carotid arteries are patent with calcified plaque causing up to mild stenosis. Anterior and middle cerebral arteries are patent. There is atherosclerotic irregularity and mild stenoses. Posterior circulation: Intracranial vertebral arteries are patent. Noncalcified plaque on the left causes mild stenosis. Basilar artery is patent. Major cerebellar artery origins are patent. Posterior cerebral arteries are patent. There is atherosclerotic irregularity including moderate stenosis of the right P2 PCA. Venous sinuses: Patent as allowed by contrast bolus timing. Review of the MIP images confirms the above findings CT Brain Perfusion Findings: CBF (<30%) Volume: 49m Perfusion (Tmax>6.0s) volume: 047mMismatch Volume: 76m53mnfarction Location: None. IMPRESSION: No large vessel occlusion. Perfusion imaging demonstrates no evidence of core infarction or penumbra, but there is exclusion of a portion of the left cerebral hemisphere from the data. Marked stenosis of the left vertebral origin without apparent flow limitation. Similar intracranial atherosclerosis. Electronically Signed   By: PraMacy MisD.   On: 10/28/2021 17:13   CT HEAD CODE STROKE WO CONTRAST  Result Date: 10/28/2021 CLINICAL DATA:  Code stroke. Neuro deficit, acute, stroke suspected. Right-sided numbness. EXAM: CT HEAD WITHOUT CONTRAST TECHNIQUE: Contiguous axial images were obtained from the base of the skull through the vertex without intravenous contrast. RADIATION DOSE REDUCTION: This exam was performed  according to the departmental dose-optimization program which includes automated exposure control, adjustment of the mA and/or kV according to patient size and/or use of iterative reconstruction technique. COMPARISON:  CT head without contrast 03/07/2021. MR head without contrast 03/08/2021 FINDINGS: Brain: Remote left parietal infarct noted. Moderate white matter disease is present bilaterally. Basal ganglia are intact. No other acute cortical abnormality is present. No acute infarct, hemorrhage, or mass lesion is present. Ventricles are of normal size. No significant extraaxial fluid collection is present. The brainstem and cerebellum are within normal limits. Vascular: Atherosclerotic calcifications are present within the cavernous internal carotid arteries and at the dural margin of both vertebral arteries. No hyperdense vessel is present. Skull: Calvarium is intact. No focal lytic or blastic lesions are present. No significant extracranial soft tissue lesion is present. Sinuses/Orbits: Chronic soft tissue thickening is present the inferior right maxillary sinus. The paranasal sinuses and mastoid air cells are otherwise clear. Bilateral lens replacements are noted. Globes and orbits are otherwise unremarkable. Other: ASPECTS ([place] Program Early CT Score) - Ganglionic level infarction (caudate, lentiform nuclei, internal capsule, insula, M1-M3 cortex): 7/7 - Supraganglionic infarction (M4-M6 cortex): 3/3 Total score (0-10 with 10 being normal): 10/10 IMPRESSION: 1. No acute intracranial abnormality. 2. Expected evolution of left parietal infarct. 3. Moderate white matter disease likely reflects the sequela of chronic microvascular ischemia. Electronically Signed   By: ChrSan MorelleD.   On: 10/28/2021 16:18     Discharge Instructions: Discharge Instructions     Diet - low sodium heart healthy   Complete by: As directed    Discharge instructions   Complete by: As directed    Ms.  Graeff,  [facility]. This is likely caused by your prior stroke. Repeat imaging of your brain showed a stroke that has happened since the fall on the same side. Please continue taking cholesterol lowering medication and controlling your diabetes to help reduce your risk  of future strokes.  Continue taking your home medications with the following changes  Start taking 1. Keppra 500 mg, two times daily 2. Clopidogrel 75 mg 1 time daily for 3 months (01/30/22)  3. Pick up either Valtoco nasal spray or Clonazepam wafers to have on hand if you have a seizure lasting more than 5 minutes. Talk with your pharmacist about which one is more cost effective as both are the same medication.  Follow up with neurology in 3 months (September). [facility] [phone]. They recommended you take clopidogrel since the MRI showed a stroke since the fall.  You should seek further medical care if you have seizure greater than 5 minutes, you have episode of vomiting during seizure then have difficulty breathing.  We recommend that you see your primary care doctor in about a week to make sure that you continue to improve. We are so glad that you are feeling better.  Sincerely, Zaydon Kinser, DO   Increase activity slowly   Complete by: As directed        Signed: Daleen Bo. Demani Weyrauch, D.O.  Internal Medicine Resident, PGY-1 [facility]  Pager: [phone] 2:32 PM, 10/30/2021   **Please contact the on call pager after 5 pm and on weekends at [phone].**

## 2021-10-30 NOTE — Progress Notes (Signed)
  Transition of Care (TOC) Screening Note   Patient Details  Name: Lisa Sutton Date of Birth: 1934/05/18   Transition of Care [facility]) CM/SW Contact:    Benard Halsted, LCSW Phone Number: 10/30/2021, 11:07 AM    Transition of Care Department [facility]) has reviewed patient and potential home health needs have been identified. We will continue to monitor patient advancement through interdisciplinary progression rounds. If new patient transition needs arise, please place a TOC consult.

## 2021-10-30 NOTE — Progress Notes (Signed)
Subjective: No acute events overnight.  No new concerns.  ROS: negative except above  Examination  Vital signs in last 24 hours: Temp:  [97.9 F (36.6 C)-98.2 F (36.8 C)] 98.1 F (36.7 C) (06/09 0821) Pulse Rate:  [56-75] 56 (06/09 0821) Resp:  [12-22] 12 (06/09 0821) BP: (109-138)/(52-107) 119/59 (06/09 0821) SpO2:  [95 %-97 %] 95 % (06/09 0821)  General: lying in bed, NAD Neuro: AOx3, able to follow commands, able to name objects but with a decreased fluency, cranial nerves II through XII grossly intact except decreased visual fields on the right, decreased sensation to touch on the right side, 5/5 in all 4 extremities  Basic Metabolic Panel: Recent Labs  Lab 10/28/21 1600 10/28/21 1616 10/29/21 0539  NA 137 137 139  K 4.4 4.6 4.8  CL 106 107 108  CO2 19*  --  21*  GLUCOSE 145* 141* 136*  BUN 30* 33* 28*  CREATININE 1.88* 2.00* 1.70*  CALCIUM 8.9  --  8.8*    CBC: Recent Labs  Lab 10/28/21 1600 10/28/21 1616  WBC 10.3  --   NEUTROABS 5.7  --   HGB 13.6 13.9  HCT 41.9 41.0  MCV 91.9  --   PLT 323  --      Coagulation Studies: Recent Labs    10/28/21 1600  LABPROT 13.1  INR 1.0    Imaging  MRI brain without contrast 10/29/2021: Two small foci of restricted diffusion, with mild ADC correlates, in the left posteroinferior frontal lobe at the anterior aspect of an area that was involved in the infarcts seen on 03/08/2021 MRI. These are favored to represent additional subacute infarcts.     ASSESSMENT AND PLAN: 57 old female with chronic left parietal infarct presented with focal convulsive status epilepticus which has since resolved.   Focal convulsive status epilepticus, resolved Subacute ischemic stroke, left frontal region -Etiology of stroke: Likely small vessel disease  Recommendations -Recommend Plavix 75 mg daily for 3 months due to intracranial stenosis and subacute stroke -Continue aspirin 81 mg daily and rosuvastatin 40 mg  nightly. -Continue Keppra 500 mg twice daily -Intranasal valtoco 7.'5mg'$ in each nostril ( total '15mg'$ ) to be used for grand mal seizure lasting over 5 minutes. Can repeat once in 4 hours. Do Not use more than twice in 24 hours. If not available or not affordable, can prescribe oral clonazepam wafers 1 mg for tonic-clonic like seizure lasting more than 5 minutes -Seizure precautions -Follow-up with neurology in 3 months -Discussed plan with medicine team and patient's son at bedside   I have spent a total of  38 minutes with the patient reviewing hospital notes,  test results, labs and examining the patient as well as establishing an assessment and plan that was discussed personally with the patient.  > 50% of time was spent in direct patient care.   Zeb Comfort Epilepsy [facility] For questions after 5pm please refer to AMION to reach the Neurologist on call

## 2021-11-13 ENCOUNTER — Encounter: Payer: Self-pay | Admitting: Dermatology

## 2021-11-26 ENCOUNTER — Other Ambulatory Visit: Payer: Self-pay | Admitting: Internal Medicine

## 2021-12-04 ENCOUNTER — Telehealth: Payer: Self-pay | Admitting: Diagnostic Neuroimaging

## 2021-12-04 NOTE — Telephone Encounter (Signed)
Pt's son, Dayra Rapley (no DPR on file) since being on levETIRAcetam (KEPPRA) 500 MG tablet (prescribed during hospital stay) due to having one seizure. Experiencing drowsiness, has no energy, sleeps a lot. Have seen PCP couple of week, ago PCP suggested to see your neurologist due to what she is experiencing.  Would like a call from the nurse to discuss a sooner appt. Please on my cell phone at [phone]  Suggested to Mr. Nicklas to take pt to the emergency room or urgent care. Mr. Cerros said, she has seen her PCP feel there's no need to take her to the emergency room.

## 2021-12-07 NOTE — Telephone Encounter (Signed)
Pt's son Jessyca Sloan inquiring about sooner appt.

## 2021-12-07 NOTE — Telephone Encounter (Signed)
Called son and advised per Dr Leta Baptist can wait and see if she adjusts to medication or can reduce dose to 250 mg,split tablets she has. He stated he will begin 1/2 dose tonight, will call for any concerns, questions. I advised we will see how she does on lower dose, leave FU as is, but can move sooner if needed. He  verbalized understanding, appreciation.

## 2021-12-29 NOTE — Telephone Encounter (Signed)
Called son who stated they cut back levetiracetam to 250 mg twice a day. Patient is still tired, sleeps a lot. She has no other complaints; he stated no other concerns. I advised will let MD know and call him back. He verbalized understanding, appreciation.

## 2021-12-29 NOTE — Telephone Encounter (Signed)
Pt's son called back to inform the RN that the pt still has no energy at all. Please call back on home or cell number to advise.

## 2021-12-30 NOTE — Telephone Encounter (Signed)
Called son, lvm LVM and advised Dr Leta Baptist stated to continue levetiracetam at current dose until he sees her for FU 02/03/22.

## 2022-02-03 ENCOUNTER — Encounter: Payer: Self-pay | Admitting: Diagnostic Neuroimaging

## 2022-02-03 ENCOUNTER — Ambulatory Visit: Payer: Medicare Other | Admitting: Diagnostic Neuroimaging

## 2022-02-03 VITALS — BP 132/80 | HR 61 | Ht 63.0 in | Wt 166.1 lb

## 2022-02-03 DIAGNOSIS — G40909 Epilepsy, unspecified, not intractable, without status epilepticus: Secondary | ICD-10-CM | POA: Diagnosis not present

## 2022-02-03 DIAGNOSIS — I6930 Unspecified sequelae of cerebral infarction: Secondary | ICD-10-CM | POA: Diagnosis not present

## 2022-02-03 MED ORDER — LEVETIRACETAM 250 MG PO TABS
250.0000 mg | ORAL_TABLET | Freq: Two times a day (BID) | ORAL | 4 refills | Status: DC
Start: 2022-02-03 — End: 2022-09-02

## 2022-02-03 NOTE — Progress Notes (Signed)
[facility]  PATIENT: Lisa Sutton DOB: April 24, 1934  REFERRING CLINICIAN: No ref. provider found HISTORY FROM: patient REASON FOR VISIT: follow up   HISTORICAL  CHIEF COMPLAINT:  Chief Complaint  Patient presents with   Follow-up    She states that she has been feeling really weak lately. No energy and hardly can walk. Room 7 with son.    HISTORY OF PRESENT ILLNESS:   UPDATE (02/03/22, VRP): Since last visit, had seizure in October 28, 2021. Now on LEV. Had some punctate subacute strokes on MRI. Intermittent dizziness, chest palpitations.   PRIOR HPI (10/22/21): 88 year old female with hypertension, hyperlipidemia and diabetes, here for stroke follow up. 03/02/21 had event of driving into garage and being confused. Went to ER and had UTI dx'd. Then 03/06/21, had recurrence of language and confusion changes. Dx'd with left MCA stroke, tx with mech thrombectomy. Stroke workup completed. Then went to rehab, and then back home by Nov 2022. Completed outpatient cardiac monitor (negative). Therapies completed at home.   Now with mild language changes and right vision changes.   REVIEW OF SYSTEMS: Full 14 system review of systems performed and negative with exception of: as per HPI.  ALLERGIES: Allergies  Allergen Reactions   Statins Other (See Comments)    'discomfort, aching everywhere' Tolerates to livalo    Other     Allergic to diabetic medication but unsure of name. Went to ED February 2023 (ED note I do not see name of med)    Zetia [Ezetimibe] Other (See Comments)    Leg pain   Zocor [Simvastatin] Other (See Comments)    Leg pain    HOME MEDICATIONS: Outpatient Medications Prior to Visit  Medication Sig Dispense Refill   amLODipine (NORVASC) 10 MG tablet Take 10 mg by mouth daily.     aspirin EC 81 MG tablet Take 81 mg by mouth daily. Swallow whole.     atenolol (TENORMIN) 25 MG tablet Take 1 tablet (25 mg total) by mouth daily. 30 tablet 0    clonazePAM (KLONOPIN) 1 MG disintegrating tablet Use if seizure lasts longer than 5 minutes 15 tablet 0   clopidogrel (PLAVIX) 75 MG tablet Take 1 tablet (75 mg total) by mouth daily. 90 tablet 0   diazePAM (VALTOCO 5 MG DOSE) 5 MG/0.1ML LIQD Spray in nose if seizure last more than 5 minutes. Can repeat dose in 4 hours. Do not use more than twice in 24 hours. 2 each 0   diphenhydrAMINE (BENADRYL) 25 MG tablet Take 1 tablet (25 mg total) by mouth every 6 (six) hours as needed. (Patient taking differently: Take 25 mg by mouth every 6 (six) hours as needed for itching or sleep.) 20 tablet 0   hydrOXYzine (ATARAX) 50 MG tablet Take 50 mg by mouth at bedtime as needed for itching or anxiety.     levothyroxine (SYNTHROID) 125 MCG tablet Take 1 tablet (125 mcg total) by mouth daily before breakfast. 90 tablet 3   losartan (COZAAR) 100 MG tablet Take 100 mg by mouth daily.     meclizine (ANTIVERT) 25 MG tablet Take 25 mg by mouth 2 (two) times daily as needed for dizziness.     metFORMIN (GLUCOPHAGE-XR) 500 MG 24 hr tablet Take 500 mg by mouth daily.     pantoprazole (PROTONIX) 40 MG tablet Take 1 tablet (40 mg total) by mouth daily before lunch. 90 tablet 3   rosuvastatin (CRESTOR) 40 MG tablet Take 40 mg by mouth at  bedtime.     triamcinolone cream (KENALOG) 0.1 % Apply 1 application. topically daily as needed (irritation).     levETIRAcetam (KEPPRA) 500 MG tablet Take 1 tablet (500 mg total) by mouth 2 (two) times daily. 60 tablet 0   No facility-administered medications prior to visit.    PAST MEDICAL HISTORY: Past Medical History:  Diagnosis Date   Anxiety    Arthritis    Cataract    CVA (cerebral vascular accident) ([address]) 03/06/2021   Dizziness    Dysphagia    'sometimes but not a major issue' been checked out by GI (per pt)   Dysrhythmia    'heart used to skip but doesn't anymore' was checked out by Dr. Ron Parker late '90s, everything checked out ok and not had any skipping since (all per pt)    Essential hypertension, benign    Fatty liver    GERD (gastroesophageal reflux disease)    H/O hypercalcemia 04/12/2017   Hepatitis B    had at age 40, 'GI doc said it's gone away'   Hepatitis B surface antigen positive    High cholesterol    History of hiatal hernia    Hypercalcemia    Hypothyroidism    Kidney stone 12/2012   Patient was seen at [facility]   Obesity    Osteoporosis    PONV (postoperative nausea and vomiting)    Type 2 diabetes mellitus ([place])     PAST SURGICAL HISTORY: Past Surgical History:  Procedure Laterality Date   ABDOMINAL HYSTERECTOMY  1981   APPENDECTOMY  1981   BILATERAL CARPAL TUNNEL RELEASE Bilateral    Dr. Marlou Sa at surgical center   BIOPSY  05/26/2017   Procedure: BIOPSY;  Surgeon: Rogene Houston, MD;  [facility];  Service: Endoscopy;;  colon   BIOPSY  10/11/2018   Procedure: BIOPSY;  Surgeon: Rogene Houston, MD;  [facility];  Service: Endoscopy;;   CATARACT EXTRACTION, BILATERAL     COLONOSCOPY N/A 05/26/2017   Procedure: COLONOSCOPY;  Surgeon: Rogene Houston, MD;  [facility];  Service: Endoscopy;  Laterality: N/A;  10:30   Deviated septum repair     ESOPHAGEAL DILATION N/A 08/16/2019   Procedure: ESOPHAGEAL DILATION;  Surgeon: Rogene Houston, MD;  [facility];  Service: Endoscopy;  Laterality: N/A;   ESOPHAGOGASTRODUODENOSCOPY N/A 07/28/2016   Procedure: ESOPHAGOGASTRODUODENOSCOPY (EGD);  Surgeon: Rogene Houston, MD;  [facility];  Service: Endoscopy;  Laterality: N/A;  300 - moved to 3/7 @ 1:00   ESOPHAGOGASTRODUODENOSCOPY N/A 08/16/2019   Procedure: ESOPHAGOGASTRODUODENOSCOPY (EGD);  Surgeon: Rogene Houston, MD;  [facility];  Service: Endoscopy;  Laterality: N/A;  [place] N/A 10/11/2018   Procedure: FLEXIBLE SIGMOIDOSCOPY;  Surgeon: Rogene Houston, MD;  [facility];  Service: Endoscopy;  Laterality: N/A;   unable to complete colonoscopy    IR CT HEAD LTD  03/07/2021   IR PERCUTANEOUS ART THROMBECTOMY/INFUSION INTRACRANIAL INC DIAG ANGIO  03/07/2021   IR US GUIDE VASC ACCESS RIGHT  03/07/2021   KNEE ARTHROPLASTY Left 09/15/2015   Procedure: COMPUTER ASSISTED TOTAL KNEE ARTHROPLASTY;  Surgeon: Marybelle Killings, MD;  Location: [place];  Service: Orthopedics;  Laterality: Left;   KNEE ARTHROSCOPY     left knee 2003   RADIOLOGY WITH ANESTHESIA N/A 03/07/2021   Procedure: IR WITH ANESTHESIA;  Surgeon: Corrie Mckusick, DO;  Location: [place];  Service: Anesthesiology;  Laterality: N/A;    FAMILY HISTORY: Family History  Problem Relation Age of Onset   COPD Mother    Heart attack Mother    Stroke Father    Hypertension Father    Hip fracture Father    Cancer Maternal Aunt    Diabetes Maternal Aunt    Diabetes Maternal Uncle    Colon cancer Neg Hx     SOCIAL HISTORY: Social History   Socioeconomic History   Marital status: Widowed    Spouse name: Not on file   Number of children: 1   Years of education: Not on file   Highest education level: High school graduate  Occupational History    Comment: retired  Tobacco Use   Smoking status: Never    Passive exposure: Past   Smokeless tobacco: Never  Vaping Use   Vaping Use: Never used  Substance and Sexual Activity   Alcohol use: No    Alcohol/week: 0.0 standard drinks of alcohol   Drug use: No   Sexual activity: Never  Other Topics Concern   Not on file  Social History Narrative   07/04/19 Lives with son  10/22/21   Caffeine- coffee 1 c daily   Social Determinants of Health   Financial Resource Strain: Low Risk  (09/05/2019)   Overall Financial Resource Strain (CARDIA)    Difficulty of Paying Living Expenses: Not hard at all  Food Insecurity: No Food Insecurity (09/05/2019)   Hunger Vital Sign    Worried About Running Out of Food in the Last Year: Never true    [place] in the Last Year: Never true  Transportation Needs: No  Transportation Needs (09/05/2019)   PRAPARE - Hydrologist (Medical): No    Lack of Transportation (Non-Medical): No  Physical Activity: Sufficiently Active (09/05/2019)   Exercise Vital Sign    Days of Exercise per Week: 3 days    Minutes of Exercise per Session: 60 min  Stress: No Stress Concern Present (09/05/2019)   [place]    Feeling of Stress : Not at all  Social Connections: Moderately Integrated (09/05/2019)   Social Connection and Isolation Panel [NHANES]    Frequency of Communication with Friends and Family: More than three times a week    Frequency of Social Gatherings with Friends and Family: More than three times a week    Attends Religious Services: More than 4 times per year    Active Member of [employer] or Organizations: No    Attends Music therapist: More than 4 times per year    Marital Status: Widowed  Intimate Partner Violence: Not At Risk (09/05/2019)   Humiliation, Afraid, Rape, and Kick questionnaire    Fear of Current or Ex-Partner: No    Emotionally Abused: No    Physically Abused: No    Sexually Abused: No     PHYSICAL EXAM  GENERAL EXAM/CONSTITUTIONAL: Vitals:  Vitals:   02/03/22 1056  BP: 132/80  Pulse: 61  Weight: 166 lb 2 oz (75.4 kg)  Height: '5\' 3"'$  (1.6 m)   Body mass index is 29.43 kg/m. Wt Readings from Last 3 Encounters:  02/03/22 166 lb 2 oz (75.4 kg)  10/29/21 162 lb 11.2 oz (73.8 kg)  10/22/21 162 lb 12.8 oz (73.8 kg)   Patient is in no distress; well developed, nourished and groomed; neck is supple  CARDIOVASCULAR: Examination of carotid arteries is normal;  no carotid bruits Regular rate and rhythm, no murmurs Examination of peripheral vascular system by observation and palpation is normal  EYES: Ophthalmoscopic exam of optic discs and posterior segments is normal; no papilledema or hemorrhages No results  found.  MUSCULOSKELETAL: Gait, strength, tone, movements noted in Neurologic exam below  NEUROLOGIC: MENTAL STATUS:     09/27/2016   11:51 AM 09/11/2015    9:51 AM  MMSE - Mini Mental State Exam  Orientation to time 5 5  Orientation to Place 5 5  Registration 3 3  Attention/ Calculation 3 4  Recall 2 3  Language- name 2 objects 2 2  Language- repeat 1 1  Language- follow 3 step command 3 3  Language- read & follow direction 1 1  Write a sentence 1 1  Copy design 0 0  Total score 26 28   awake, alert, oriented to person recent and remote memory intact normal attention and concentration SLIGHTLY DECR FLUENCY; MILD COMPREHENSION DIFF AND APRAXIA fund of knowledge appropriate  CRANIAL NERVE:  2nd, 3rd, 4th, 6th - pupils --> RIGHT 2, LEFT 3; NR; DECR RIGHT VISUAL FIELD; extraocular muscles intact, no nystagmus 5th - facial sensation symmetric 7th - facial strength symmetric 8th - hearing intact 9th - palate elevates symmetrically, uvula midline 11th - shoulder shrug symmetric 12th - tongue protrusion midline  MOTOR:  normal bulk and tone, full strength in the BUE, BLE  SENSORY:  normal and symmetric to light touch, temperature, vibration  COORDINATION:  finger-nose-finger, fine finger movements --> SLIGHT DECR COORDINATION IN RUE  REFLEXES:  deep tendon reflexes TRACE and symmetric  GAIT/STATION:  narrow based gait     DIAGNOSTIC DATA (LABS, IMAGING, TESTING) - I reviewed patient records, labs, notes, testing and imaging myself where available.  Lab Results  Component Value Date   WBC 10.3 10/28/2021   HGB 13.9 10/28/2021   HCT 41.0 10/28/2021   MCV 91.9 10/28/2021   PLT 323 10/28/2021      Component Value Date/Time   NA 139 10/29/2021 0539   NA 137 07/16/2019 1002   K 4.8 10/29/2021 0539   CL 108 10/29/2021 0539   CO2 21 (L) 10/29/2021 0539   GLUCOSE 136 (H) 10/29/2021 0539   BUN 28 (H) 10/29/2021 0539   BUN 19 07/16/2019 1002   CREATININE 1.70  (H) 10/29/2021 0539   CALCIUM 8.8 (L) 10/29/2021 0539   PROT 7.0 10/28/2021 1600   PROT 6.1 07/16/2019 1002   ALBUMIN 3.8 10/28/2021 1600   ALBUMIN 3.5 (L) 07/16/2019 1002   AST 19 10/28/2021 1600   ALT 13 10/28/2021 1600   ALKPHOS 102 10/28/2021 1600   BILITOT 0.5 10/28/2021 1600   BILITOT 0.2 07/16/2019 1002   GFRNONAA 29 (L) 10/29/2021 0539   GFRAA 38 (L) 07/16/2019 1002   Lab Results  Component Value Date   CHOL 113 10/29/2021   HDL 39 (L) 10/29/2021   LDLCALC 30 10/29/2021   LDLDIRECT 147.9 (H) 03/07/2021   TRIG 218 (H) 10/29/2021   CHOLHDL 2.9 10/29/2021   Lab Results  Component Value Date   HGBA1C 7.2 (H) 10/28/2021   Lab Results  Component Value Date   VITAMINB12 >2000 (H) 01/27/2017   Lab Results  Component Value Date   TSH 0.483 10/29/2021       ASSESSMENT AND PLAN  88 y.o. year old female here with:   Dx:  1. Chronic ischemic left MCA stroke   2. Seizure disorder ([place])      PLAN:  SEIZURE DISORDER (post stroke)  - levetiracetam '250mg'$  twice a day (may consider LEV ER '500mg'$  at bedtime if still feeling tired in the AM)  - According to [place] law, you can not drive unless you are seizure / syncope free for at least 6 months and under physician's care.   - Please maintain precautions. Do not participate in activities where a loss of awareness could harm you or someone else. No swimming alone, no tub bathing, no hot tubs, no driving, no operating motorized vehicles (cars, ATVs, motocycles, etc), lawnmowers, power tools or firearms. No standing at heights, such as rooftops, ladders or stairs. Avoid hot objects such as stoves, heaters, open fires. Wear a helmet when riding a bicycle, scooter, skateboard, etc. and avoid areas of traffic. Set your water heater to 120 degrees or less.    Stroke: Left MCA stroke due to left M2 inferior division occlusion s/p IR, etiology likely large vessel disease, although cardioembolic source can not be completely ruled  out, given recurrent strokes Setup implanted loop recorder --> cardiac event monitoring (intermittent palpitations; rule out afib) continue aspirin '81mg'$  daily (completed aspirin + plavix x 3 months)   Hypertension Continue atenolol, norvasc, losartan  Hyperlipidemia crestor '40mg'$  daily   Diabetes type II Controlled Continue metformin HgbA1c 6.2, goal < 7.0   Meds ordered this encounter  Medications   levETIRAcetam (KEPPRA) 250 MG tablet    Sig: Take 1 tablet (250 mg total) by mouth 2 (two) times daily.    Dispense:  180 tablet    Refill:  4   Orders Placed This Encounter  Procedures   Ambulatory referral to Cardiac Electrophysiology   Return in about 1 year (around 02/04/2023) for with NP Frann Rider).    Penni Bombard, MD 5/63/1497, 02:63 AM Certified in Neurology, Neurophysiology and Neuroimaging  [facility] [address], [place], [place] [address] [phone]

## 2022-02-03 NOTE — Patient Instructions (Addendum)
-   continue levetiracetam '250mg'$  twice a day   - continue aspirin '81mg'$  daily  - stop plavix (clopidogrel)  - refer to cardiology for heart monitor

## 2022-02-15 ENCOUNTER — Telehealth: Payer: Self-pay | Admitting: Diagnostic Neuroimaging

## 2022-02-15 DIAGNOSIS — Z8673 Personal history of transient ischemic attack (TIA), and cerebral infarction without residual deficits: Secondary | ICD-10-CM

## 2022-02-15 NOTE — Telephone Encounter (Signed)
Pt's son, Marlen Koman (on [place]) following up on mother getting a heart monitor. Would like a call from the nurse.

## 2022-02-24 ENCOUNTER — Other Ambulatory Visit: Payer: Self-pay | Admitting: Diagnostic Neuroimaging

## 2022-02-24 DIAGNOSIS — Z8673 Personal history of transient ischemic attack (TIA), and cerebral infarction without residual deficits: Secondary | ICD-10-CM

## 2022-02-24 DIAGNOSIS — R42 Dizziness and giddiness: Secondary | ICD-10-CM

## 2022-02-24 DIAGNOSIS — I639 Cerebral infarction, unspecified: Secondary | ICD-10-CM

## 2022-02-24 DIAGNOSIS — R002 Palpitations: Secondary | ICD-10-CM

## 2022-02-24 NOTE — Telephone Encounter (Signed)
Spoke with [facility]. Nurse said do not do Heart Monitors referral should be going to [facility].  Spoke with [facility], Nurse said should send an order not a referral for Cardiac Event Monitor. The referrals that have been sent can not be done until corrected. The nurse said enter ICD-10 Code: I63.9 for Cardiac Event Monitor; can select CVA or Cryptogenic.  Have several that need to be corrected.

## 2022-02-25 ENCOUNTER — Telehealth: Payer: Self-pay | Admitting: Cardiology

## 2022-02-25 NOTE — Telephone Encounter (Signed)
Verified with son that the monitor was ordered by Dr. Leta Baptist for his mother to rule out A-Fib.  Son gave permission to take the hold off of shipping.  When he receives it he will me to schedule an appointment for application and tutorial on monitor. Evelena Asa from [employer] contacted to have monitor shipped out today.

## 2022-02-25 NOTE — Telephone Encounter (Signed)
Pt states he got a call from [employer] saying they are waiting on the okay from [place] to ship the monitor off. Informed him that you said it was mailed yesterday but he would like to be sure that it was.

## 2022-03-03 ENCOUNTER — Ambulatory Visit: Payer: Medicare Other | Attending: Diagnostic Neuroimaging

## 2022-03-03 DIAGNOSIS — R002 Palpitations: Secondary | ICD-10-CM | POA: Diagnosis not present

## 2022-03-03 DIAGNOSIS — R42 Dizziness and giddiness: Secondary | ICD-10-CM | POA: Diagnosis not present

## 2022-03-03 DIAGNOSIS — Z8673 Personal history of transient ischemic attack (TIA), and cerebral infarction without residual deficits: Secondary | ICD-10-CM

## 2022-03-03 DIAGNOSIS — I639 Cerebral infarction, unspecified: Secondary | ICD-10-CM | POA: Diagnosis not present

## 2022-03-31 ENCOUNTER — Telehealth (HOSPITAL_BASED_OUTPATIENT_CLINIC_OR_DEPARTMENT_OTHER): Payer: Self-pay | Admitting: Cardiology

## 2022-03-31 NOTE — Telephone Encounter (Signed)
Returned call to patient's son who reports patient's 30 day monitor was falling off, so he removed it. Instructed son to contact [employer] and let them know the device had been removed and he was returning it.  Patient's 30 days expires 04/02/2022.  Georgana Curio MHA RN CCM

## 2022-03-31 NOTE — Telephone Encounter (Signed)
Patient's son is calling stating the patient's heart monitor is reading "poor skin contact". He states he has tried rubbing it back on, but it does not help. He is wanting to know if it is okay to go ahead and take the heart monitor off due to this. Patient's 30 day mark is this Friday. Please advise.

## 2022-04-07 ENCOUNTER — Ambulatory Visit: Payer: Medicare Other | Attending: Cardiology | Admitting: Cardiology

## 2022-04-07 ENCOUNTER — Encounter: Payer: Self-pay | Admitting: Cardiology

## 2022-04-07 VITALS — BP 124/72 | HR 93 | Ht 63.0 in | Wt 168.8 lb

## 2022-04-07 DIAGNOSIS — I639 Cerebral infarction, unspecified: Secondary | ICD-10-CM | POA: Diagnosis not present

## 2022-04-07 NOTE — Patient Instructions (Signed)
Medication Instructions:  Your physician recommends that you continue on your current medications as directed. Please refer to the Current Medication list given to you today.  Labwork: None ordered.  Testing/Procedures: None ordered.  Follow-Up: As needed with Dr. Curt Bears    Implantable Loop Recorder Placement, Care After This sheet gives you information about how to care for yourself after your procedure. Your health care provider may also give you more specific instructions. If you have problems or questions, contact your health care provider. What can I expect after the procedure? After the procedure, it is common to have: Soreness or discomfort near the incision. Some swelling or bruising near the incision.  Follow these instructions at home: Incision care  Monitor your cardiac device site for redness, swelling, and drainage. Call the device clinic at [phone] if you experience these symptoms or fever/chills.  Keep the large square bandage on your site for 24 hours and then you may remove it yourself. Keep the steri-strips underneath in place.   You may shower after 72 hours / 3 days from your procedure with the steri-strips in place. They will usually fall off on their own, or may be removed after 10 days. Pat dry.   Avoid lotions, ointments, or perfumes over your incision until it is well-healed.  Please do not submerge in water until your site is completely healed.   Your device is MRI compatible.   Remote monitoring is used to monitor your cardiac device from home. This monitoring is scheduled every month by our office. It allows [place] to keep an eye on the function of your device to ensure it is working properly.  If your wound site starts to bleed apply pressure.    For help with the monitor please call [employer] Monitor Support Specialist directly at [phone].    If you have any questions/concerns please call the device clinic at  [phone].  Activity  Return to your normal activities.  General instructions Follow instructions from your health care provider about how to manage your implantable loop recorder and transmit the information. Learn how to activate a recording if this is necessary for your type of device. You may go through a metal detection gate, and you may let someone hold a metal detector over your chest. Show your ID card if needed. Do not have an MRI unless you check with your health care provider first. Take over-the-counter and prescription medicines only as told by your health care provider. Keep all follow-up visits as told by your health care provider. This is important. Contact a health care provider if: You have redness, swelling, or pain around your incision. You have a fever. You have pain that is not relieved by your pain medicine. You have triggered your device because of fainting (syncope) or because of a heartbeat that feels like it is racing, slow, fluttering, or skipping (palpitations). Get help right away if you have: Chest pain. Difficulty breathing. Summary After the procedure, it is common to have soreness or discomfort near the incision. Change your dressing as told by your health care provider. Follow instructions from your health care provider about how to manage your implantable loop recorder and transmit the information. Keep all follow-up visits as told by your health care provider. This is important. This information is not intended to replace advice given to you by your health care provider. Make sure you discuss any questions you have with your health care provider. Document Released: 04/21/2015 Document Revised: 06/25/2017 Document Reviewed: 06/25/2017 Elsevier  Patient Education  [employer].

## 2022-04-07 NOTE — Progress Notes (Signed)
Electrophysiology Office Note   Date:  04/07/2022   ID:  Lisa Sutton, DOB May 13, 1934, MRN [id number]  PCP:  Adaline Sill, NP  Cardiologist:   Primary Electrophysiologist:  [name] Meredith Leeds, MD    Chief Complaint: CVA   History of Present Illness: Lisa Sutton is a 88 y.o. female who is being seen today for the evaluation of CVA at the request of Penumalli, Earlean Polka, MD. Presenting today for electrophysiology evaluation.  She has a past history significant for CVA, hypertension, hyperlipidemia, diabetes.  She presented to the hospital in June 2023 with left MCA stroke and status post mechanical thrombectomy.  She had residual right homonymous hemianopsia and right hand weakness.  Today, she denies symptoms of palpitations, chest pain, shortness of breath, orthopnea, PND, lower extremity edema, claudication, dizziness, presyncope, syncope, bleeding, or neurologic sequela. The patient is tolerating medications without difficulties.  Her main complaint today is fatigue.  She is able to do her daily activities, but is doing that much more slowly than she was prior to her stroke.  Her level of fatigue worsen after her stroke.  She does have a history of seizures which is also occurred since her stroke.   Past Medical History:  Diagnosis Date   Anxiety    Arthritis    Cataract    CVA (cerebral vascular accident) ([place]) 03/06/2021   Dizziness    Dysphagia    'sometimes but not a major issue' been checked out by GI (per pt)   Dysrhythmia    'heart used to skip but doesn't anymore' was checked out by Dr. Ron Parker late '90s, everything checked out ok and not had any skipping since (all per pt)   Essential hypertension, benign    Fatty liver    GERD (gastroesophageal reflux disease)    H/O hypercalcemia 04/12/2017   Hepatitis B    had at age 10, 'GI doc said it's gone away'   Hepatitis B surface antigen positive    High cholesterol    History of hiatal hernia     Hypercalcemia    Hypothyroidism    Kidney stone 12/2012   Patient was seen at [facility]   Obesity    Osteoporosis    PONV (postoperative nausea and vomiting)    Type 2 diabetes mellitus ([place])    Past Surgical History:  Procedure Laterality Date   ABDOMINAL HYSTERECTOMY  1981   APPENDECTOMY  1981   BILATERAL CARPAL TUNNEL RELEASE Bilateral    Dr. Marlou Sa at surgical center   BIOPSY  05/26/2017   Procedure: BIOPSY;  Surgeon: Rogene Houston, MD;  [facility];  Service: Endoscopy;;  colon   BIOPSY  10/11/2018   Procedure: BIOPSY;  Surgeon: Rogene Houston, MD;  [facility];  Service: Endoscopy;;   CATARACT EXTRACTION, BILATERAL     COLONOSCOPY N/A 05/26/2017   Procedure: COLONOSCOPY;  Surgeon: Rogene Houston, MD;  [facility];  Service: Endoscopy;  Laterality: N/A;  10:30   Deviated septum repair     ESOPHAGEAL DILATION N/A 08/16/2019   Procedure: ESOPHAGEAL DILATION;  Surgeon: Rogene Houston, MD;  [facility];  Service: Endoscopy;  Laterality: N/A;   ESOPHAGOGASTRODUODENOSCOPY N/A 07/28/2016   Procedure: ESOPHAGOGASTRODUODENOSCOPY (EGD);  Surgeon: Rogene Houston, MD;  [facility];  Service: Endoscopy;  Laterality: N/A;  300 - moved to 3/7 @ 1:00   ESOPHAGOGASTRODUODENOSCOPY N/A 08/16/2019   Procedure: ESOPHAGOGASTRODUODENOSCOPY (EGD);  Surgeon: [name]  U, MD;  [facility];  Service: Endoscopy;  Laterality: N/A;  [place] N/A 10/11/2018   Procedure: FLEXIBLE SIGMOIDOSCOPY;  Surgeon: Rogene Houston, MD;  [facility];  Service: Endoscopy;  Laterality: N/A;  unable to complete colonoscopy    IR CT HEAD LTD  03/07/2021   IR PERCUTANEOUS ART THROMBECTOMY/INFUSION INTRACRANIAL INC DIAG ANGIO  03/07/2021   IR US GUIDE VASC ACCESS RIGHT  03/07/2021   KNEE ARTHROPLASTY Left 09/15/2015   Procedure: COMPUTER ASSISTED TOTAL KNEE ARTHROPLASTY;  Surgeon: Marybelle Killings,  MD;  Location: [facility];  Service: Orthopedics;  Laterality: Left;   KNEE ARTHROSCOPY     left knee 2003   RADIOLOGY WITH ANESTHESIA N/A 03/07/2021   Procedure: IR WITH ANESTHESIA;  Surgeon: Corrie Mckusick, DO;  Location: [facility];  Service: Anesthesiology;  Laterality: N/A;     Current Outpatient Medications  Medication Sig Dispense Refill   amLODipine (NORVASC) 10 MG tablet Take 10 mg by mouth daily.     aspirin EC 81 MG tablet Take 81 mg by mouth daily. Swallow whole.     clopidogrel (PLAVIX) 75 MG tablet Take 1 tablet (75 mg total) by mouth daily. 90 tablet 0   diphenhydrAMINE (BENADRYL) 25 MG tablet Take 1 tablet (25 mg total) by mouth every 6 (six) hours as needed. (Patient taking differently: Take 25 mg by mouth every 6 (six) hours as needed for itching or sleep.) 20 tablet 0   glipiZIDE (GLUCOTROL) 5 MG tablet Take 5 mg by mouth every morning.     JARDIANCE 10 MG TABS tablet Take 10 mg by mouth daily.     levETIRAcetam (KEPPRA) 250 MG tablet Take 1 tablet (250 mg total) by mouth 2 (two) times daily. 180 tablet 4   levothyroxine (SYNTHROID) 125 MCG tablet Take 1 tablet (125 mcg total) by mouth daily before breakfast. 90 tablet 3   losartan (COZAAR) 100 MG tablet Take 100 mg by mouth daily.     meclizine (ANTIVERT) 25 MG tablet Take 25 mg by mouth 2 (two) times daily as needed for dizziness.     metFORMIN (GLUCOPHAGE-XR) 500 MG 24 hr tablet Take 500 mg by mouth daily.     pantoprazole (PROTONIX) 40 MG tablet Take 1 tablet (40 mg total) by mouth daily before lunch. 90 tablet 3   rosuvastatin (CRESTOR) 40 MG tablet Take 40 mg by mouth at bedtime.     triamcinolone cream (KENALOG) 0.1 % Apply 1 application. topically daily as needed (irritation).     atenolol (TENORMIN) 25 MG tablet Take 1 tablet (25 mg total) by mouth daily. (Patient not taking: Reported on 04/07/2022) 30 tablet 0   clonazePAM (KLONOPIN) 1 MG disintegrating tablet Use if seizure lasts longer than 5 minutes (Patient not taking:  Reported on 04/07/2022) 15 tablet 0   diazePAM (VALTOCO 5 MG DOSE) 5 MG/0.1ML LIQD Spray in nose if seizure last more than 5 minutes. Can repeat dose in 4 hours. Do not use more than twice in 24 hours. (Patient not taking: Reported on 04/07/2022) 2 each 0   hydrOXYzine (ATARAX) 50 MG tablet Take 50 mg by mouth at bedtime as needed for itching or anxiety. (Patient not taking: Reported on 04/07/2022)     No current facility-administered medications for this visit.    Allergies:   Statins, Other, Zetia [ezetimibe], and Zocor [simvastatin]   Social History:  The patient  reports that she has never  smoked. She has been exposed to tobacco smoke. She has never used smokeless tobacco. She reports that she does not drink alcohol and does not use drugs.   Family History:  The patient's family history includes COPD in her mother; Cancer in her maternal aunt; Diabetes in her maternal aunt and maternal uncle; Heart attack in her mother; Hip fracture in her father; Hypertension in her father; Stroke in her father.    ROS:  Please see the history of present illness.   Otherwise, review of systems is positive for none.   All other systems are reviewed and negative.    PHYSICAL EXAM: VS:  BP 124/72   Pulse 93   Ht '5\' 3"'$  (1.6 m)   Wt 168 lb 12.8 oz (76.6 kg)   SpO2 97%   BMI 29.90 kg/m  , BMI Body mass index is 29.9 kg/m. GEN: Well nourished, well developed, in no acute distress  HEENT: normal  Neck: no JVD, carotid bruits, or masses Cardiac: RRR; no murmurs, rubs, or gallops,no edema  Respiratory:  clear to auscultation bilaterally, normal work of breathing GI: soft, nontender, nondistended, + BS MS: no deformity or atrophy  Skin: warm and dry Neuro:  Strength and sensation are intact Psych: euthymic mood, full affect  EKG:  EKG is ordered today. Personal review of the ekg ordered shows sinus rhythm, rate 93  Recent Labs: 10/28/2021: ALT 13; Hemoglobin 13.9; Platelets 323 10/29/2021: BUN 28;  Creatinine, Ser 1.70; Potassium 4.8; Sodium 139; TSH 0.483    Lipid Panel     Component Value Date/Time   CHOL 113 10/29/2021 0539   CHOL 343 (H) 07/16/2019 1002   CHOL 147 04/16/2013 0820   TRIG 218 (H) 10/29/2021 0539   TRIG 368 (H) 04/21/2016 0812   TRIG 172 (H) 04/16/2013 0820   HDL 39 (L) 10/29/2021 0539   HDL 54 07/16/2019 1002   HDL 46 04/21/2016 0812   HDL 53 04/16/2013 0820   CHOLHDL 2.9 10/29/2021 0539   VLDL 44 (H) 10/29/2021 0539   LDLCALC 30 10/29/2021 0539   LDLCALC 205 (H) 07/16/2019 1002   LDLCALC 60 04/16/2013 0820   LDLDIRECT 147.9 (H) 03/07/2021 0624     Wt Readings from Last 3 Encounters:  04/07/22 168 lb 12.8 oz (76.6 kg)  02/03/22 166 lb 2 oz (75.4 kg)  10/29/21 162 lb 11.2 oz (73.8 kg)      Other studies Reviewed: Additional studies/ records that were reviewed today include: Cardiac monitor 04/06/2022 personally reviewed Review of the above records today demonstrates:  Sinus rhythm, 0% atrial fibrillation burden  TTE 03/08/2021  1. Left ventricular ejection fraction, by estimation, is 60 to 65%. The  left ventricle has normal function. The left ventricle has no regional  wall motion abnormalities. There is mild concentric left ventricular  hypertrophy. Left ventricular diastolic  parameters are indeterminate.   2. Right ventricular systolic function is normal. The right ventricular  size is normal. There is normal pulmonary artery systolic pressure. The  estimated right ventricular systolic pressure is 83.4 mmHg.   3. The mitral valve is degenerative. No evidence of mitral valve  regurgitation. No evidence of mitral stenosis. Moderate to severe mitral  annular calcification.   4. The aortic valve is grossly normal. Aortic valve regurgitation is not  visualized. No aortic stenosis is present.   5. The inferior vena cava is normal in size with greater than 50%  respiratory variability, suggesting right atrial pressure of 3 mmHg.   ASSESSMENT  AND PLAN:  1.  Cryptogenic stroke: Due to left M2 inferior division occlusion.  Wore cardiac monitor that showed no evidence of atrial fibrillation.  Due to that, loop monitor would be indicated.  Risks and benefits have been discussed which include bleeding and infection.  She understands these risks and is agreed to the procedure.  2.  Hypertension: Well-controlled  3.  Hyperlipidemia: Continue Crestor 40 mg daily per primary physician and neurology    Current medicines are reviewed at length with the patient today.   The patient does not have concerns regarding her medicines.  The following changes were made today:  none  Labs/ tests ordered today include:  Orders Placed This Encounter  Procedures   EKG 12-Lead     Disposition:   FU with Sabena Winner pending ILR implant  Signed, Abrea Henle Meredith Leeds, MD  04/07/2022 9:06 AM     [facility] [address] [place] [address] [phone] (office) [phone] (fax)  SURGEON:  Keland Peyton Meredith Leeds, MD     PREPROCEDURE DIAGNOSIS:  Cryptogenic stroke    POSTPROCEDURE DIAGNOSIS: Cryptogenic stroke     PROCEDURES:   1. Implantable loop recorder implantation    INTRODUCTION:  [place] presents with a history of cryptogenic stroke The costs of loop recorder monitoring have been discussed with the patient.    DESCRIPTION OF PROCEDURE:  Informed written consent was obtained.  The patient required no sedation for the procedure today.  Mapping over the patient's chest was performed to identify the area where electrograms were most prominent for ILR recording.  This area was found to be the left parasternal region over the 4th intercostal space. The patients left chest was therefore prepped and draped in the usual sterile fashion. The skin overlying the left parasternal region was infiltrated with lidocaine for local analgesia.  A 0.5-cm incision was made over the left parasternal region over the  3rd intercostal space.  A subcutaneous ILR pocket was fashioned using a combination of sharp and blunt dissection.  A Med[employer]onic Reveal LINQ (serial # [id number]) implantable loop recorder was then placed into the pocket  R waves were very prominent and measured 0.68m.  Steri- Strips and a sterile dressing were then applied.  There were no early apparent complications.     CONCLUSIONS:   1. Successful implantation of a implantable loop recorder for a history of cryptogenic stroke  2. No early apparent complications.   Harjit Leider MMeredith Leeds MD  04/07/2022 9:06 AM

## 2022-05-18 ENCOUNTER — Ambulatory Visit (INDEPENDENT_AMBULATORY_CARE_PROVIDER_SITE_OTHER): Payer: Medicare Other

## 2022-05-18 DIAGNOSIS — I639 Cerebral infarction, unspecified: Secondary | ICD-10-CM

## 2022-05-18 LAB — CUP PACEART REMOTE DEVICE CHECK
Date Time Interrogation Session: 20231223085747
Implantable Pulse Generator Implant Date: 20231115

## 2022-06-09 ENCOUNTER — Telehealth: Payer: Self-pay | Admitting: Diagnostic Neuroimaging

## 2022-06-09 NOTE — Telephone Encounter (Signed)
Pt son called back, message from [place], [place] was relayed.  Son will f/u with PCP. No call back requested.

## 2022-06-09 NOTE — Telephone Encounter (Signed)
Pt's son reports that pt is complaining of weakness and pain in legs and feet.  He'd like a call from RN to discuss what can be suggested.  Pt has recently seen PCP who suggested son watch pt diet and take medications.  Please call son.

## 2022-06-09 NOTE — Telephone Encounter (Signed)
Called pt son. Left VM message to please call the office.

## 2022-06-10 ENCOUNTER — Emergency Department (HOSPITAL_COMMUNITY): Payer: Medicare Other

## 2022-06-10 ENCOUNTER — Inpatient Hospital Stay (HOSPITAL_COMMUNITY): Payer: Medicare Other

## 2022-06-10 ENCOUNTER — Other Ambulatory Visit: Payer: Self-pay

## 2022-06-10 ENCOUNTER — Inpatient Hospital Stay (HOSPITAL_COMMUNITY)
Admission: EM | Admit: 2022-06-10 | Discharge: 2022-06-18 | DRG: 065 | Disposition: A | Discharge status: Skilled Nursing Facility | Payer: Medicare Other | Attending: Family Medicine | Admitting: Family Medicine

## 2022-06-10 ENCOUNTER — Encounter (HOSPITAL_COMMUNITY): Payer: Self-pay | Admitting: Neurology

## 2022-06-10 DIAGNOSIS — Z7982 Long term (current) use of aspirin: Secondary | ICD-10-CM

## 2022-06-10 DIAGNOSIS — N184 Chronic kidney disease, stage 4 (severe): Secondary | ICD-10-CM | POA: Diagnosis present

## 2022-06-10 DIAGNOSIS — G629 Polyneuropathy, unspecified: Secondary | ICD-10-CM | POA: Diagnosis present

## 2022-06-10 DIAGNOSIS — Z833 Family history of diabetes mellitus: Secondary | ICD-10-CM

## 2022-06-10 DIAGNOSIS — W19XXXA Unspecified fall, initial encounter: Secondary | ICD-10-CM | POA: Diagnosis present

## 2022-06-10 DIAGNOSIS — Z7902 Long term (current) use of antithrombotics/antiplatelets: Secondary | ICD-10-CM

## 2022-06-10 DIAGNOSIS — G40909 Epilepsy, unspecified, not intractable, without status epilepticus: Secondary | ICD-10-CM | POA: Diagnosis present

## 2022-06-10 DIAGNOSIS — N179 Acute kidney failure, unspecified: Secondary | ICD-10-CM | POA: Diagnosis present

## 2022-06-10 DIAGNOSIS — F419 Anxiety disorder, unspecified: Secondary | ICD-10-CM | POA: Diagnosis present

## 2022-06-10 DIAGNOSIS — I612 Nontraumatic intracerebral hemorrhage in hemisphere, unspecified: Secondary | ICD-10-CM | POA: Diagnosis not present

## 2022-06-10 DIAGNOSIS — I129 Hypertensive chronic kidney disease with stage 1 through stage 4 chronic kidney disease, or unspecified chronic kidney disease: Secondary | ICD-10-CM | POA: Diagnosis present

## 2022-06-10 DIAGNOSIS — Z823 Family history of stroke: Secondary | ICD-10-CM | POA: Diagnosis not present

## 2022-06-10 DIAGNOSIS — M810 Age-related osteoporosis without current pathological fracture: Secondary | ICD-10-CM | POA: Diagnosis present

## 2022-06-10 DIAGNOSIS — I672 Cerebral atherosclerosis: Secondary | ICD-10-CM | POA: Diagnosis present

## 2022-06-10 DIAGNOSIS — Z6830 Body mass index (BMI) 30.0-30.9, adult: Secondary | ICD-10-CM | POA: Diagnosis not present

## 2022-06-10 DIAGNOSIS — R29719 NIHSS score 19: Secondary | ICD-10-CM | POA: Diagnosis present

## 2022-06-10 DIAGNOSIS — E7800 Pure hypercholesterolemia, unspecified: Secondary | ICD-10-CM | POA: Diagnosis present

## 2022-06-10 DIAGNOSIS — E039 Hypothyroidism, unspecified: Secondary | ICD-10-CM | POA: Diagnosis present

## 2022-06-10 DIAGNOSIS — S0990XA Unspecified injury of head, initial encounter: Secondary | ICD-10-CM

## 2022-06-10 DIAGNOSIS — I611 Nontraumatic intracerebral hemorrhage in hemisphere, cortical: Secondary | ICD-10-CM

## 2022-06-10 DIAGNOSIS — R400 Somnolence: Principal | ICD-10-CM

## 2022-06-10 DIAGNOSIS — I69351 Hemiplegia and hemiparesis following cerebral infarction affecting right dominant side: Secondary | ICD-10-CM | POA: Diagnosis not present

## 2022-06-10 DIAGNOSIS — Z87442 Personal history of urinary calculi: Secondary | ICD-10-CM

## 2022-06-10 DIAGNOSIS — E669 Obesity, unspecified: Secondary | ICD-10-CM | POA: Diagnosis present

## 2022-06-10 DIAGNOSIS — Z7984 Long term (current) use of oral hypoglycemic drugs: Secondary | ICD-10-CM

## 2022-06-10 DIAGNOSIS — Z825 Family history of asthma and other chronic lower respiratory diseases: Secondary | ICD-10-CM

## 2022-06-10 DIAGNOSIS — R531 Weakness: Secondary | ICD-10-CM

## 2022-06-10 DIAGNOSIS — K760 Fatty (change of) liver, not elsewhere classified: Secondary | ICD-10-CM | POA: Diagnosis present

## 2022-06-10 DIAGNOSIS — R569 Unspecified convulsions: Secondary | ICD-10-CM | POA: Diagnosis not present

## 2022-06-10 DIAGNOSIS — Z809 Family history of malignant neoplasm, unspecified: Secondary | ICD-10-CM

## 2022-06-10 DIAGNOSIS — Z79899 Other long term (current) drug therapy: Secondary | ICD-10-CM

## 2022-06-10 DIAGNOSIS — E1142 Type 2 diabetes mellitus with diabetic polyneuropathy: Secondary | ICD-10-CM | POA: Diagnosis present

## 2022-06-10 DIAGNOSIS — Y92009 Unspecified place in unspecified non-institutional (private) residence as the place of occurrence of the external cause: Secondary | ICD-10-CM | POA: Diagnosis not present

## 2022-06-10 DIAGNOSIS — I619 Nontraumatic intracerebral hemorrhage, unspecified: Secondary | ICD-10-CM | POA: Diagnosis present

## 2022-06-10 DIAGNOSIS — D649 Anemia, unspecified: Secondary | ICD-10-CM | POA: Diagnosis not present

## 2022-06-10 DIAGNOSIS — I6389 Other cerebral infarction: Secondary | ICD-10-CM

## 2022-06-10 DIAGNOSIS — I10 Essential (primary) hypertension: Secondary | ICD-10-CM | POA: Diagnosis not present

## 2022-06-10 DIAGNOSIS — E1165 Type 2 diabetes mellitus with hyperglycemia: Secondary | ICD-10-CM | POA: Diagnosis present

## 2022-06-10 DIAGNOSIS — E1122 Type 2 diabetes mellitus with diabetic chronic kidney disease: Secondary | ICD-10-CM | POA: Diagnosis present

## 2022-06-10 DIAGNOSIS — Z8249 Family history of ischemic heart disease and other diseases of the circulatory system: Secondary | ICD-10-CM

## 2022-06-10 DIAGNOSIS — E875 Hyperkalemia: Secondary | ICD-10-CM | POA: Diagnosis present

## 2022-06-10 DIAGNOSIS — R401 Stupor: Secondary | ICD-10-CM | POA: Diagnosis not present

## 2022-06-10 DIAGNOSIS — S0636AA Traumatic hemorrhage of cerebrum, unspecified, with loss of consciousness status unknown, initial encounter: Secondary | ICD-10-CM

## 2022-06-10 DIAGNOSIS — R008 Other abnormalities of heart beat: Secondary | ICD-10-CM | POA: Diagnosis present

## 2022-06-10 DIAGNOSIS — Z87898 Personal history of other specified conditions: Secondary | ICD-10-CM | POA: Diagnosis not present

## 2022-06-10 DIAGNOSIS — R000 Tachycardia, unspecified: Secondary | ICD-10-CM | POA: Diagnosis present

## 2022-06-10 DIAGNOSIS — Z79890 Hormone replacement therapy (postmenopausal): Secondary | ICD-10-CM

## 2022-06-10 DIAGNOSIS — Z8619 Personal history of other infectious and parasitic diseases: Secondary | ICD-10-CM

## 2022-06-10 DIAGNOSIS — Z8673 Personal history of transient ischemic attack (TIA), and cerebral infarction without residual deficits: Secondary | ICD-10-CM | POA: Diagnosis not present

## 2022-06-10 DIAGNOSIS — K219 Gastro-esophageal reflux disease without esophagitis: Secondary | ICD-10-CM | POA: Diagnosis present

## 2022-06-10 DIAGNOSIS — Z888 Allergy status to other drugs, medicaments and biological substances status: Secondary | ICD-10-CM

## 2022-06-10 LAB — COMPREHENSIVE METABOLIC PANEL
ALT: 14 U/L (ref 0–44)
ALT: 16 U/L (ref 0–44)
AST: 23 U/L (ref 15–41)
AST: 28 U/L (ref 15–41)
Albumin: 3.4 g/dL — ABNORMAL LOW (ref 3.5–5.0)
Albumin: 3.4 g/dL — ABNORMAL LOW (ref 3.5–5.0)
Alkaline Phosphatase: 77 U/L (ref 38–126)
Alkaline Phosphatase: 75 U/L (ref 38–126)
Anion gap: 12 (ref 5–15)
Anion gap: 11 (ref 5–15)
BUN: 34 mg/dL — ABNORMAL HIGH (ref 8–23)
BUN: 32 mg/dL — ABNORMAL HIGH (ref 8–23)
CO2: 17 mmol/L — ABNORMAL LOW (ref 22–32)
CO2: 17 mmol/L — ABNORMAL LOW (ref 22–32)
Calcium: 8.4 mg/dL — ABNORMAL LOW (ref 8.9–10.3)
Calcium: 8.7 mg/dL — ABNORMAL LOW (ref 8.9–10.3)
Chloride: 105 mmol/L (ref 98–111)
Chloride: 109 mmol/L (ref 98–111)
Creatinine, Ser: 2.4 mg/dL — ABNORMAL HIGH (ref 0.44–1.00)
Creatinine, Ser: 2.2 mg/dL — ABNORMAL HIGH (ref 0.44–1.00)
GFR, Estimated: 19 mL/min — ABNORMAL LOW (ref >60)
GFR, Estimated: 21 mL/min — ABNORMAL LOW (ref >60)
Glucose, Bld: 251 mg/dL — ABNORMAL HIGH (ref 70–99)
Glucose, Bld: 82 mg/dL (ref 70–99)
Potassium: 5.4 mmol/L — ABNORMAL HIGH (ref 3.5–5.1)
Potassium: 5.9 mmol/L — ABNORMAL HIGH (ref 3.5–5.1)
Sodium: 134 mmol/L — ABNORMAL LOW (ref 135–145)
Sodium: 137 mmol/L (ref 135–145)
Total Bilirubin: 0.7 mg/dL (ref 0.3–1.2)
Total Bilirubin: 0.9 mg/dL (ref 0.3–1.2)
Total Protein: 6.3 g/dL — ABNORMAL LOW (ref 6.5–8.1)
Total Protein: 6 g/dL — ABNORMAL LOW (ref 6.5–8.1)

## 2022-06-10 LAB — DIFFERENTIAL
Abs Immature Granulocytes: 0.12 10*3/uL — ABNORMAL HIGH (ref 0.00–0.07)
Basophils Absolute: 0.1 10*3/uL (ref 0.0–0.1)
Basophils Relative: 1 %
Eosinophils Absolute: 0 10*3/uL (ref 0.0–0.5)
Eosinophils Relative: 0 %
Immature Granulocytes: 1 %
Lymphocytes Relative: 20 %
Lymphs Abs: 1.8 10*3/uL (ref 0.7–4.0)
Monocytes Absolute: 1.1 10*3/uL — ABNORMAL HIGH (ref 0.1–1.0)
Monocytes Relative: 12 %
Neutro Abs: 5.8 10*3/uL (ref 1.7–7.7)
Neutrophils Relative %: 66 %

## 2022-06-10 LAB — CBC
HCT: 38.1 % (ref 36.0–46.0)
Hemoglobin: 12.1 g/dL (ref 12.0–15.0)
MCH: 27.9 pg (ref 26.0–34.0)
MCHC: 31.8 g/dL (ref 30.0–36.0)
MCV: 87.8 fL (ref 80.0–100.0)
Platelets: 234 10*3/uL (ref 150–400)
RBC: 4.34 MIL/uL (ref 3.87–5.11)
RDW: 14.1 % (ref 11.5–15.5)
WBC: 8.9 10*3/uL (ref 4.0–10.5)
nRBC: 0 % (ref 0.0–0.2)

## 2022-06-10 LAB — I-STAT CHEM 8, ED
BUN: 42 mg/dL — ABNORMAL HIGH (ref 8–23)
Calcium, Ion: 1.11 mmol/L — ABNORMAL LOW (ref 1.15–1.40)
Chloride: 106 mmol/L (ref 98–111)
Creatinine, Ser: 2 mg/dL — ABNORMAL HIGH (ref 0.44–1.00)
Glucose, Bld: 268 mg/dL — ABNORMAL HIGH (ref 70–99)
HCT: 37 % (ref 36.0–46.0)
Hemoglobin: 12.6 g/dL (ref 12.0–15.0)
Potassium: 5.4 mmol/L — ABNORMAL HIGH (ref 3.5–5.1)
Sodium: 135 mmol/L (ref 135–145)
TCO2: 19 mmol/L — ABNORMAL LOW (ref 22–32)

## 2022-06-10 LAB — PROTIME-INR
INR: 1 (ref 0.8–1.2)
Prothrombin Time: 13.4 seconds (ref 11.4–15.2)

## 2022-06-10 LAB — ECHOCARDIOGRAM COMPLETE
Area-P 1/2: 3.72 cm2
Calc EF: 37.5 %
Height: 63 in
Single Plane A2C EF: 26.7 %
Single Plane A4C EF: 48.3 %
Weight: 2772.5 oz

## 2022-06-10 LAB — MRSA NEXT GEN BY PCR, NASAL: MRSA by PCR Next Gen: NOT DETECTED

## 2022-06-10 LAB — TROPONIN I (HIGH SENSITIVITY): Troponin I (High Sensitivity): 3 ng/L (ref <18)

## 2022-06-10 LAB — ETHANOL: Alcohol, Ethyl (B): <10 mg/dL (ref <10)

## 2022-06-10 LAB — GLUCOSE, CAPILLARY: Glucose-Capillary: 85 mg/dL (ref 70–99)

## 2022-06-10 LAB — APTT: aPTT: 33 seconds (ref 24–36)

## 2022-06-10 LAB — CBG MONITORING, ED: Glucose-Capillary: 273 mg/dL — ABNORMAL HIGH (ref 70–99)

## 2022-06-10 MED ORDER — ACETAMINOPHEN 160 MG/5ML PO SOLN: 650.0000 mg | ORAL | Status: DC | PRN

## 2022-06-10 MED ORDER — LORAZEPAM 2 MG/ML IJ SOLN: 2.0000 mg | Freq: Once | INTRAMUSCULAR | Status: AC

## 2022-06-10 MED ORDER — ACETAMINOPHEN 650 MG RE SUPP: 650.0000 mg | RECTAL | Status: DC | PRN

## 2022-06-10 MED ORDER — ACETAMINOPHEN 325 MG PO TABS
650.0000 mg | ORAL_TABLET | ORAL | Status: DC | PRN
  Administered 2022-06-10 – 2022-06-16 (×8): 650 mg via ORAL
  Filled 2022-06-10 (×8): qty 2

## 2022-06-10 MED ORDER — PANTOPRAZOLE SODIUM 40 MG IV SOLR
40.0000 mg | Freq: Every day | INTRAVENOUS | Status: DC
  Administered 2022-06-10 – 2022-06-11 (×2): 40 mg via INTRAVENOUS
  Filled 2022-06-10 (×2): qty 10

## 2022-06-10 MED ORDER — IOHEXOL 350 MG/ML SOLN
75.0000 mL | Freq: Once | INTRAVENOUS | Status: AC | PRN
  Administered 2022-06-10: 75 mL via INTRAVENOUS

## 2022-06-10 MED ORDER — CLEVIDIPINE BUTYRATE 0.5 MG/ML IV EMUL: 0.0000 mg/h | INTRAVENOUS | Status: DC

## 2022-06-10 MED ORDER — ORAL CARE MOUTH RINSE: 15.0000 mL | OROMUCOSAL | Status: DC | PRN

## 2022-06-10 MED ORDER — LEVETIRACETAM IN NACL 1500 MG/100ML IV SOLN
1500.0000 mg | Freq: Once | INTRAVENOUS | Status: AC
  Administered 2022-06-10: 1500 mg via INTRAVENOUS
  Filled 2022-06-10: qty 100

## 2022-06-10 MED ORDER — LEVETIRACETAM IN NACL 500 MG/100ML IV SOLN
500.0000 mg | Freq: Two times a day (BID) | INTRAVENOUS | Status: DC
  Administered 2022-06-10 – 2022-06-11 (×3): 500 mg via INTRAVENOUS
  Filled 2022-06-10 (×3): qty 100

## 2022-06-10 MED ORDER — SODIUM CHLORIDE 0.9 % IV SOLN: INTRAVENOUS | Status: DC

## 2022-06-10 MED ORDER — STROKE: EARLY STAGES OF RECOVERY BOOK
Freq: Once | Status: AC
  Filled 2022-06-10: qty 1

## 2022-06-10 MED ORDER — LORAZEPAM 2 MG/ML IJ SOLN
INTRAMUSCULAR | Status: AC
  Administered 2022-06-10: 2 mg
  Filled 2022-06-10: qty 1

## 2022-06-10 NOTE — Code Documentation (Signed)
Stroke Response Nurse Documentation Code Documentation  Lisa Sutton is a 89 y.o. female arriving to [facility]  via  [facility]  on 06/10/22 with past medical hx of HTN, HLD, DM, seizures, and stroke with residual right-sided deficits . On aspirin 81 mg daily and clopidogrel 75 mg daily. Code stroke was activated by EMS.   Patient from home where she was LKW at [place] and now complaining of AMS, worsening right sided weakness, facial droop. Patient was at the breakfast table and seen normal by her son at 74. She began experiencing right sided pain/sensory deficits which are her typical precorser to her seizures. She got up and had an unwitnessed fall. Her son was around the corner. He called EMS. BP 151/90 and CBG in the 300's with EMS.   Stroke team at the bedside on patient arrival. Labs drawn and patient cleared for CT by Dr. Philip Aspen. Patient to CT with team. NIHSS 17, see documentation for details and code stroke times. Patient with disoriented, not following commands, right facial droop, right arm weakness, bilateral leg weakness, Global aphasia , and dysarthria  on exam. The following imaging was completed:  CT Head and CTA. Patient is not a candidate for IV Thrombolytic due to small hemorrhage on the CT scan per Neurologist. Patient is not a candidate for IR due to no LVO. '2mg'$  of Ativan and 1.5g of Keppra given IV. Pupils 2+ b/l.    "IMPRESSION: 1. New 4 mm high-density along the left temporal cortex suggesting acute hemorrhage. No acute infarct noted. 2. Chronic left parieto-occipital and right corona radiata infarcts."   Care Plan: Q1 NIHSS/pupils/vitals, BP 130-150, EEG, Cleviprex gtt if needed.   Bedside handoff with ED RN.    Margarette Asal  Stroke Response RN [phone]

## 2022-06-10 NOTE — Progress Notes (Signed)
PT Cancellation Note  Patient Details Name: Lisa Sutton MRN: [id number] DOB: 02-03-1934   Cancelled Treatment:    Reason Eval/Treat Not Completed: Patient at procedure or test/unavailable.  Pt is in Echo, will retry tomorrow.   Ramond Dial 06/10/2022, 1:33 PM  Mee Hives, PT PhD Acute Rehab Dept. Number: [place]

## 2022-06-10 NOTE — Inpatient Diabetes Management (Signed)
Inpatient Diabetes Program Recommendations  AACE/ADA: New Consensus Statement on Inpatient Glycemic Control (2015)  Target Ranges:  Prepandial:   less than 140 mg/dL      Peak postprandial:   less than 180 mg/dL (1-2 hours)      Critically ill patients:  140 - 180 mg/dL   Lab Results  Component Value Date   GLUCAP 273 (H) 06/10/2022   HGBA1C 7.2 (H) 10/28/2021    Diabetes history: DM2 Outpatient Diabetes medications: Glucotrol 5 mg qd, Jardiance 10 qd, Metformin 500 mg qd Current orders for Inpatient glycemic control: None yet  Inpatient Diabetes Program Recommendations:   Received consult regarding glucose management while in the hospital with admission CBG 273. On admission: -Add Glycemic control order set with 0-9 units q 4 hrs. While NPO.  Thank you, Nani Gasser. Jamon Hayhurst, RN, MSN, CDE  Diabetes Coordinator Inpatient Glycemic Control Team Team Pager [phone] (8am-5pm) 06/10/2022 2:22 PM'

## 2022-06-10 NOTE — Progress Notes (Signed)
STAT LTM EEG hooked up and running - no initial skin breakdown - push button tested. CT compatible leads used.

## 2022-06-10 NOTE — ED Notes (Signed)
Attempted report to MICU

## 2022-06-10 NOTE — ED Notes (Signed)
Pt off floor unable to assess NIH

## 2022-06-10 NOTE — ED Notes (Signed)
Report given to [facility] MICU -08

## 2022-06-10 NOTE — ED Notes (Signed)
Family updated as to patient's status.

## 2022-06-10 NOTE — Progress Notes (Signed)
  Echocardiogram 2D Echocardiogram has been performed.  Lisa Sutton 06/10/2022, 2:22 PM

## 2022-06-10 NOTE — ED Provider Notes (Signed)
[facility] EMERGENCY DEPARTMENT Provider Note   CSN: [id number] Arrival date & time: 06/10/22  0940  An emergency department physician performed an initial assessment on this suspected stroke patient at [phone].  History  No chief complaint on file.   Lisa Sutton is a 89 y.o. female.  With a history of HTN, HLD, DM and stroke with residual right-sided deficits who presents with fall and altered mental status.  Patient was reportedly experiencing pain in her right side which typically is or that she has for her seizures.  Went into another room and had had a fall and was confused afterwards.  Son called 75 and she was brought in for evaluation.  Altered on arrival. Unable to give additional history.  Is on aspirin and Plavix but no other blood thinners.       Home Medications Prior to Admission medications   Medication Sig Start Date End Date Taking? Authorizing Provider  amLODipine (NORVASC) 10 MG tablet Take 10 mg by mouth daily. 12/09/20  Yes [provider]  aspirin EC 81 MG tablet Take 81 mg by mouth daily. Swallow whole.   Yes [provider]  cholecalciferol (VITAMIN D3) 25 MCG (1000 UNIT) tablet Take 1,000 Units by mouth daily.   Yes [provider]  diazePAM (VALTOCO 5 MG DOSE) 5 MG/0.1ML LIQD Spray in nose if seizure last more than 5 minutes. Can repeat dose in 4 hours. Do not use more than twice in 24 hours. 10/30/21  Yes Masters, Katie, DO  diphenhydrAMINE (BENADRYL) 25 MG tablet Take 1 tablet (25 mg total) by mouth every 6 (six) hours as needed. Patient taking differently: Take 25 mg by mouth every 6 (six) hours as needed for itching or sleep. 07/17/21  Yes Maudie Flakes, MD  glipiZIDE (GLUCOTROL) 5 MG tablet Take 5 mg by mouth every morning. 02/22/22  Yes [provider]  JARDIANCE 10 MG TABS tablet Take 10 mg by mouth daily. 03/10/22  Yes [provider]  levETIRAcetam (KEPPRA) 250 MG tablet Take 1 tablet  (250 mg total) by mouth 2 (two) times daily. 02/03/22  Yes Penumalli, Earlean Polka, MD  levothyroxine (SYNTHROID) 125 MCG tablet Take 1 tablet (125 mcg total) by mouth daily before breakfast. 01/16/21  Yes Philemon Kingdom, MD  losartan (COZAAR) 100 MG tablet Take 100 mg by mouth daily. 10/01/21  Yes [provider]  meclizine (ANTIVERT) 25 MG tablet Take 25 mg by mouth 2 (two) times daily as needed for dizziness. 09/11/21  Yes [provider]  metFORMIN (GLUCOPHAGE-XR) 500 MG 24 hr tablet Take 500 mg by mouth daily. 10/15/21  Yes [provider]  pantoprazole (PROTONIX) 40 MG tablet Take 1 tablet (40 mg total) by mouth daily before lunch. 08/05/20  Yes Rehman, Mechele Dawley, MD  rosuvastatin (CRESTOR) 40 MG tablet Take 40 mg by mouth at bedtime. 10/02/21  Yes [provider]  triamcinolone cream (KENALOG) 0.1 % Apply 1 application. topically daily as needed (irritation). 07/16/21  Yes [provider]  clopidogrel (PLAVIX) 75 MG tablet Take 1 tablet (75 mg total) by mouth daily. Patient not taking: Reported on 06/10/2022 10/30/21 01/31/23  Masters, Joellen Jersey, DO  hydrOXYzine (ATARAX) 50 MG tablet Take 50 mg by mouth at bedtime as needed for itching or anxiety. Patient not taking: Reported on 04/07/2022 07/20/21   [provider]  calcium citrate-vitamin D 200-200 MG-UNIT TABS Take 1 tablet by mouth daily.    08/10/11  [provider]  Allergies    Statins, Empagliflozin, Other, Zetia [ezetimibe], and Zocor [simvastatin]    Review of Systems   Review of Systems  Physical Exam Updated Vital Signs BP 108/70   Pulse 79   Temp (!) 97.5 F (36.4 C) (Oral)   Resp 14   Ht '5\' 3"'$  (1.6 m)   Wt 78.6 kg   SpO2 98%   BMI 30.70 kg/m  Physical Exam Vitals and nursing note reviewed.  Constitutional:      General: She is not in acute distress.    Appearance: She is well-developed.     Comments: Occasionally making groaning noises  HENT:     Head:  Normocephalic and atraumatic.     Right Ear: External ear normal.     Left Ear: External ear normal.     Nose: Nose normal.  Eyes:     Extraocular Movements: Extraocular movements intact.     Conjunctiva/sclera: Conjunctivae normal.     Pupils: Pupils are equal, round, and reactive to light.     Comments: 3 mm and reactive bilaterally  Cardiovascular:     Rate and Rhythm: Normal rate and regular rhythm.  Pulmonary:     Effort: Pulmonary effort is normal. No respiratory distress.  Musculoskeletal:     Cervical back: Normal range of motion and neck supple.     Right lower leg: No edema.     Left lower leg: No edema.  Skin:    General: Skin is warm and dry.  Neurological:     Mental Status: She is alert. She is disoriented.     Comments: Withdraws all 4 extremities.  Moaning but nonverbal.  Opens eyes to voice.  Psychiatric:        Mood and Affect: Mood normal.     ED Results / Procedures / Treatments   Labs (all labs ordered are listed, but only abnormal results are displayed) Labs Reviewed  DIFFERENTIAL - Abnormal; Notable for the following components:      Result Value   Monocytes Absolute 1.1 (*)    Abs Immature Granulocytes 0.12 (*)    All other components within normal limits  COMPREHENSIVE METABOLIC PANEL - Abnormal; Notable for the following components:   Sodium 134 (*)    Potassium 5.4 (*)    CO2 17 (*)    Glucose, Bld 251 (*)    BUN 34 (*)    Creatinine, Ser 2.40 (*)    Calcium 8.4 (*)    Total Protein 6.3 (*)    Albumin 3.4 (*)    GFR, Estimated 19 (*)    All other components within normal limits  I-STAT CHEM 8, ED - Abnormal; Notable for the following components:   Potassium 5.4 (*)    BUN 42 (*)    Creatinine, Ser 2.00 (*)    Glucose, Bld 268 (*)    Calcium, Ion 1.11 (*)    TCO2 19 (*)    All other components within normal limits  CBG MONITORING, ED - Abnormal; Notable for the following components:   Glucose-Capillary 273 (*)    All other components  within normal limits  ETHANOL  PROTIME-INR  APTT  CBC  RAPID URINE DRUG SCREEN, HOSP PERFORMED  URINALYSIS, ROUTINE W REFLEX MICROSCOPIC  COMPREHENSIVE METABOLIC PANEL  CBC WITH DIFFERENTIAL/PLATELET  BASIC METABOLIC PANEL  TROPONIN I (HIGH SENSITIVITY)    EKG EKG Interpretation  Date/Time:  Thursday June 10 2022 16:39:18 EST Ventricular Rate:  78 PR Interval:  212 QRS Duration: 85 QT  Interval:  370 QTC Calculation: 422 R Axis:   64 Text Interpretation: Sinus rhythm Borderline prolonged PR interval Low voltage, precordial leads Anteroseptal infarct, old Minimal ST elevation, inferior leads No significant change since last tracing Confirmed by Margaretmary Eddy [phone]) on 06/10/2022 5:59:38 PM  Radiology ECHOCARDIOGRAM COMPLETE  Result Date: 06/10/2022    ECHOCARDIOGRAM REPORT   Patient Name:   TIERRAH ANASTOS Date of Exam: 06/10/2022 Medical Rec #:  [id number]         Height:       63.0 in Accession #:    [id number]        Weight:       173.3 lb Date of Birth:  Mar 03, 1934         BSA:          1.819 m Patient Age:    9 years          BP:           107/76 mmHg Patient Gender: F                 HR:           79 bpm. Exam Location:  Inpatient Procedure: 2D Echo, Cardiac Doppler and Color Doppler Indications:    Stroke I63.9  History:        Patient has prior history of Echocardiogram examinations, most                 recent 03/08/2021. Stroke, Signs/Symptoms:Altered Mental Status;                 Risk Factors:Diabetes, Dyslipidemia and Hypertension.  Sonographer:    Greer Pickerel Referring Phys: [id number] Otelia Santee  Sonographer Comments: Technically difficult study due to poor echo windows. Image acquisition challenging due to patient body habitus and Image acquisition challenging due to respiratory motion. IMPRESSIONS  1. Challenging windows. Left ventricular ejection fraction, by estimation, is 55 to 60%. The left ventricle has normal function. The left ventricle has no regional wall  motion abnormalities. There is mild left ventricular hypertrophy. Left ventricular diastolic parameters are consistent with Grade I diastolic dysfunction (impaired relaxation).  2. Right ventricular systolic function is normal. The right ventricular size is normal.  3. Left atrial size was mild to moderately dilated.  4. No evidence of mitral valve regurgitation. Moderate mitral annular calcification.  5. The aortic valve was not well visualized. Aortic valve regurgitation is not visualized.  6. The inferior vena cava is normal in size with greater than 50% respiratory variability, suggesting right atrial pressure of 3 mmHg. Comparison(s): No significant change from prior study. FINDINGS  Left Ventricle: Challenging windows. Left ventricular ejection fraction, by estimation, is 55 to 60%. The left ventricle has normal function. The left ventricle has no regional wall motion abnormalities. The left ventricular internal cavity size was normal in size. There is mild left ventricular hypertrophy. Left ventricular diastolic parameters are consistent with Grade I diastolic dysfunction (impaired relaxation). Right Ventricle: The right ventricular size is normal. Right ventricular systolic function is normal. Left Atrium: Left atrial size was mild to moderately dilated. Right Atrium: Right atrial size was normal in size. Pericardium: There is no evidence of pericardial effusion. Mitral Valve: Moderate mitral annular calcification. No evidence of mitral valve regurgitation. Tricuspid Valve: Tricuspid valve regurgitation is not demonstrated. Aortic Valve: The aortic valve was not well visualized. Aortic valve regurgitation is not visualized. Pulmonic Valve: The pulmonic valve was not well visualized. Aorta: The aortic root  and ascending aorta are structurally normal, with no evidence of dilitation. Venous: The inferior vena cava is normal in size with greater than 50% respiratory variability, suggesting right atrial pressure  of 3 mmHg. IAS/Shunts: The interatrial septum was not well visualized.  LEFT VENTRICLE PLAX 2D LVOT diam:     1.50 cm     Diastology LV SV:         39          LV e' medial:    6.68 cm/s LV SV Index:   21          LV E/e' medial:  8.9 LVOT Area:     1.77 cm    LV e' lateral:   5.75 cm/s                            LV E/e' lateral: 10.3  LV Volumes (MOD) LV vol d, MOD A2C: 40.5 ml LV vol d, MOD A4C: 87.8 ml LV vol s, MOD A2C: 29.7 ml LV vol s, MOD A4C: 45.4 ml LV SV MOD A2C:     10.8 ml LV SV MOD A4C:     87.8 ml LV SV MOD BP:      23.4 ml RIGHT VENTRICLE RV S prime:     11.80 cm/s TAPSE (M-mode): 2.2 cm LEFT ATRIUM             Index        RIGHT ATRIUM           Index LA Vol (A2C):   76.6 ml 42.10 ml/m  RA Area:     19.50 cm LA Vol (A4C):   74.2 ml 40.78 ml/m  RA Volume:   53.30 ml  29.30 ml/m LA Biplane Vol: 80.7 ml 44.36 ml/m  AORTIC VALVE LVOT Vmax:   116.50 cm/s LVOT Vmean:  76.800 cm/s LVOT VTI:    0.218 m  AORTA Ao Root diam: 3.20 cm MITRAL VALVE MV Area (PHT): 3.72 cm     SHUNTS MV Decel Time: 204 msec     Systemic VTI:  0.22 m MV E velocity: 59.20 cm/s   Systemic Diam: 1.50 cm MV A velocity: 112.00 cm/s MV E/A ratio:  0.53 Landscape architect signed by Phineas Inches Signature Date/Time: 06/10/2022/2:55:13 PM    Final    CT ANGIO HEAD NECK W WO CM (CODE STROKE)  Result Date: 06/10/2022 CLINICAL DATA:  Provided history: Neuro deficit, acute, stroke suspected. Acute on chronic right-sided weakness, aphasia, unresponsiveness, possible seizure, chronic left MCA stroke. EXAM: CT ANGIOGRAPHY HEAD AND NECK TECHNIQUE: Multidetector CT imaging of the head and neck was performed using the standard protocol during bolus administration of intravenous contrast. Multiplanar CT image reconstructions and MIPs were obtained to evaluate the vascular anatomy. Carotid stenosis measurements (when applicable) are obtained utilizing NASCET criteria, using the distal internal carotid diameter as the denominator.  RADIATION DOSE REDUCTION: This exam was performed according to the departmental dose-optimization program which includes automated exposure control, adjustment of the mA and/or kV according to patient size and/or use of iterative reconstruction technique. CONTRAST:  43m OMNIPAQUE IOHEXOL 350 MG/ML SOLN COMPARISON:  Contrast head CT performed earlier the same day 06/10/2022. CT angiogram head/neck 10/28/2021. FINDINGS: CTA NECK FINDINGS Aortic arch: Standard aortic branching. Atherosclerotic plaque within the visualized aortic arch and proximal major branch vessels of the neck., 6 Streak and beam hardening artifact arising from a dense left-sided contrast bolus partially obscures the  left subclavian artery. Within this limitation, there is no appreciable hemodynamically significant innominate or proximal subclavian artery stenosis. Right carotid system: CCA and ICA patent within the neck without stenosis or significant atherosclerotic disease. Left carotid system: CCA and ICA patent within the neck without stenosis. Mild atherosclerotic plaque about the carotid bifurcation. Vertebral arteries: Vertebral arteries codominant and patent within the neck. Redemonstrated moderate to severe atherosclerotic narrowing at the origin of the left vertebral artery. Skeleton: Cervical spondylosis. Congenital nonunion of the posterior arch of C1. No acute fracture or aggressive osseous lesion. Other neck: No neck mass or cervical lymphadenopathy. Upper chest: No consolidation within the imaged lung apices. Review of the MIP images confirms the above findings CTA HEAD FINDINGS Anterior circulation: The intracranial internal carotid arteries are patent. Atherosclerotic plaque within the intracranial right ICA with no more than mild stenosis. Mild atherosclerotic stenosis within the left cavernous segment. Up to moderate atherosclerotic narrowing within the paraclinoid left ICA. The M1 middle cerebral arteries are patent.  Atherosclerotic irregularity of the M2 and more distal MCA vessels, bilaterally. Most notably, there is an unchanged moderate/severe focal stenosis within a mid M2 left MCA vessel (series 11, image 13). The anterior cerebral arteries are patent. Mild atherosclerotic irregularity of both vessels. No intracranial aneurysm is identified. Posterior circulation: The intracranial vertebral arteries are patent. The basilar artery is patent. The posterior cerebral arteries are patent. Atherosclerotic irregularity of both vessels, most notably as follows. New severe stenosis within the left PCA P1 segment. Redemonstrated mild-to-moderate focal stenosis within the left PCA P2 segment. Redemonstrated moderate/severe stenosis within the right PCA P2 segment. Small posterior communicating arteries are present, bilaterally. Venous sinuses: Within the limitations of contrast timing, no convincing thrombus. Anatomic variants: As described. Review of the MIP images confirms the above findings CTA head impressions #1 and #3 called by telephone at the time of interpretation on 06/10/2022 at 10:35 am to provider [facility] , who verbally acknowledged these results. IMPRESSION: CTA neck: 1. The common carotid and internal carotid arteries are patent within the neck without stenosis. Mild atherosclerotic plaque about the left carotid bifurcation. 2. The vertebral arteries are patent within the neck. Redemonstrated moderate-to-severe atherosclerotic stenosis at the origin of the left vertebral artery. CTA head: 1. No intracranial large vessel occlusion is identified. 2. Intracranial atherosclerotic disease, as outlined and most notably as follows. 3. Severe stenosis within the left PCA P1 segment, new from the prior CTA of 10/28/2021. 4. Redemonstrated moderate/severe stenosis within the right PCA P2 segment. 5. Redemonstrated moderate/severe stenosis within a mid M2 left MCA vessel. Electronically Signed   By: Kellie Simmering D.O.   On:  06/10/2022 10:35   CT HEAD CODE STROKE WO CONTRAST  Result Date: 06/10/2022 CLINICAL DATA:  Code stroke.  Right handed weakness EXAM: CT HEAD WITHOUT CONTRAST TECHNIQUE: Contiguous axial images were obtained from the base of the skull through the vertex without intravenous contrast. RADIATION DOSE REDUCTION: This exam was performed according to the departmental dose-optimization program which includes automated exposure control, adjustment of the mA and/or kV according to patient size and/or use of iterative reconstruction technique. COMPARISON:  10/28/2021 FINDINGS: Brain: New small hyperdensity along the superficial cortex of the lateral left temporal lobe measuring 4 mm. Chronic left parietooccipital cortically based infarct. Chronic perforator infarct at the right corona radiata. No hydrocephalus or masslike finding. Generalized cerebral volume loss and extensive chronic small vessel ischemia. Vascular: No hyperdense vessel or unexpected calcification. Skull: Normal. Negative for fracture or focal lesion. Sinuses/Orbits:  No acute finding. Other: These results were called by telephone at the time of interpretation on 06/10/2022 at 10:03 am to provider [facility] , who verbally acknowledged these results. IMPRESSION: 1. New 4 mm high-density along the left temporal cortex suggesting acute hemorrhage. No acute infarct noted. 2. Chronic left parieto-occipital and right corona radiata infarcts. Electronically Signed   By: Jorje Guild M.D.   On: 06/10/2022 10:10    Procedures Procedures   Medications Ordered in ED Medications  clevidipine (CLEVIPREX) infusion 0.5 mg/mL (has no administration in time range)   stroke: early stages of recovery book (has no administration in time range)  acetaminophen (TYLENOL) tablet 650 mg (has no administration in time range)    Or  acetaminophen (TYLENOL) 160 MG/5ML solution 650 mg (has no administration in time range)    Or  acetaminophen (TYLENOL) suppository  650 mg (has no administration in time range)  pantoprazole (PROTONIX) injection 40 mg (has no administration in time range)  levETIRAcetam (KEPPRA) IVPB 500 mg/100 mL premix (has no administration in time range)  0.9 %  sodium chloride infusion ( Intravenous New Bag/Given 06/10/22 1127)  levETIRAcetam (KEPPRA) IVPB 1500 mg/ 100 mL premix (0 mg Intravenous Stopped 06/10/22 1024)  LORazepam (ATIVAN) 2 MG/ML injection (2 mg  Given 06/10/22 0956)  iohexol (OMNIPAQUE) 350 MG/ML injection 75 mL (75 mLs Intravenous Contrast Given 06/10/22 1010)  LORazepam (ATIVAN) injection 2 mg (0 mg Intravenous Duplicate 1/32/44 0102)    ED Course/ Medical Decision Making/ A&P Clinical Course as of 06/10/22 1804  Thu Jun 10, 2022  1013 CT HEAD CODE STROKE WO CONTRAST New 4 mm high-density along the left temporal cortex suggesting acute hemorrhage. [RP]  7253 Discussed Friday with Dr. Quinn Axe from neurology.  Does not feel that patient needs reversal for antiplatelet therapy.  They will admit to the hospital for further management. [RP]    Clinical Course User Index [RP] Fransico Meadow, MD                            Medical Decision Making Amount and/or Complexity of Data Reviewed Radiology:  Decision-making details documented in ED Course.  Risk Decision regarding hospitalization.   ELLYANA CRIGLER is a 89 y.o. female with comorbidities that complicate the patient evaluation including hypertension, hyperlipidemia, diabetes, stroke, and seizures who presents to the emergency department with fall and altered mental status  Initial Ddx:  Seizure, Todd's paralysis, stroke, ICH  MDM:  Initially concern about possible seizure given the patient's fall and prodrome.  Will obtain CT head to evaluate for ICH.  Also on the differential would be stroke so code stroke is activated.  Patient appears to be protecting her airway on arrival.  Plan:  Labs Code stroke CT head CTA head Neurology consult  ED  Summary/Re-evaluation:  Patient reassessed and was stable in the emergency department.  CT head showed possible small possible hemorrhage.  Neurology did not feel that this was likely in proportion to her deficits that they felt that she may have had a seizure.  Patient was loaded with Keppra and given Ativan.  Neurology will admit the patient.  Blood pressure remained within goal in the emergency department.  Since she is not on blood thinners did not require reversal.  This patient presents to the ED for concern of complaints listed in HPI, this involves an extensive number of treatment options, and is a complaint that carries with it a high  risk of complications and morbidity. Disposition including potential need for admission considered.   Dispo: Admit  Additional history obtained from EMS Records reviewed Outpatient Clinic Notes The following labs were independently interpreted: Chemistry and show AKI I personally reviewed and interpreted cardiac monitoring: normal sinus rhythm  I personally reviewed and interpreted the pt's EKG: see above for interpretation  I have reviewed the patients home medications and made adjustments as needed Consults: Neurology Social Determinants of health:  None  Final Clinical Impression(s) / ED Diagnoses Final diagnoses:  Somnolence  Fall in home, initial encounter  Minor head trauma  Traumatic intracerebral hemorrhage with unknown loss of consciousness status, unspecified laterality, initial encounter ([place])    Rx / DC Orders ED Discharge Orders     None      CRITICAL CARE Performed by: Fransico Meadow   Total critical care time: 30 minutes  Critical care time was exclusive of separately billable procedures and treating other patients.  Critical care was necessary to treat or prevent imminent or life-threatening deterioration.  Critical care was time spent personally by me on the following activities: development of treatment plan with  patient and/or surrogate as well as nursing, discussions with consultants, evaluation of patient's response to treatment, examination of patient, obtaining history from patient or surrogate, ordering and performing treatments and interventions, ordering and review of laboratory studies, ordering and review of radiographic studies, pulse oximetry and re-evaluation of patient's condition.    Fransico Meadow, MD 06/10/22 [phone]

## 2022-06-10 NOTE — ED Notes (Signed)
Pt returned to room, pt incontinent of urine and cleaned and placed on purewick

## 2022-06-10 NOTE — Progress Notes (Signed)
PT Cancellation Note  Patient Details Name: Lisa Sutton MRN: [id number] DOB: [birth year]   Cancelled Treatment:    Reason Eval/Treat Not Completed: Other (comment).  Pt was just getting EEG started and nursing is assessing stroke, asked PT to wait.  Follow up as time and pt allow.   Ramond Dial 06/10/2022, 11:30 AM  Mee Hives, PT PhD Acute Rehab Dept. Number: [place] [facility] [place]

## 2022-06-10 NOTE — H&P (Signed)
Neurology H&P  CC: CODE STROKE  History is obtained from:chart and son, Lisa Sutton.   HPI: Lisa Sutton is a 89 y.o. female with past medical hx of HTN, HLD, DM, seizures, and remote L MCA stroke with residual right-sided deficits BIB EMS as a CODE STROKE d/t acute onset of AMS, worsening right-side weakness. She reportedly had an unwitnessed fall before EMS was called. CBG in the field was 346. BP was 151/90.  Upon assessment at the ED bridge, patient stuporous, disoriented, not following commands, right facial droop, right arm weakness, bilateral leg weakness, Global aphasia, and dysarthria.  [name] has a history of left MCA stroke with residual right-sided weakness.  She is on both aspirin and Plavix at home. No anticoagulation.  Patient has a history of seizures.  Per son patient was fine this morning at breakfast around 720 and then afterwards she exhibited her normal seizure prodrome which is sensory deficit and pain on her right side. She given 2 g of Ativan and CT as well as a Keppra load of 20 mg/kg without change in exam. Assessment in ED 17 after CT scan presented as post-ictal.   CT head  1. New 4 mm high-density along the left temporal cortex suggesting acute hemorrhage. No acute infarct noted. 2. Chronic left parieto-occipital and right corona radiata infarcts.  CTA H&N  1. No intracranial large vessel occlusion is identified. 2. Intracranial atherosclerotic disease, as outlined and most notably as follows. 3. Severe stenosis within the left PCA P1 segment, new from the prior CTA of 10/28/2021. 4. Redemonstrated moderate/severe stenosis within the right PCA P2 segment. 5. Redemonstrated moderate/severe stenosis within a mid M2 left MCA vessel.  CNS imaging personally reviewed and d/w radiology by neurology attending Dr. Quinn Axe  LKW: [phone] tpa given?: No, ICH IR Thrombectomy? No, ICH Modified Rankin Scale: 4-Needs assistance to walk and tend to bodily  needs NIHSS: 19 ICH score: 1  NIHSS components Score: Comment  1a Level of Conscious 0'[]'$  1'[]'$  2'[x]'$  3'[]'$      1b LOC Questions 0'[]'$  1'[]'$  2'[x]'$       1c LOC Commands 0'[]'$  1'[]'$  2'[x]'$       2 Best Gaze 0'[x]'$  1'[]'$  2'[]'$       3 Visual 0'[x]'$  1'[]'$  2'[]'$  3'[]'$      4 Facial Palsy 0'[]'$  1'[x]'$  2'[]'$  3'[]'$      5a Motor Arm - left 0'[x]'$  1'[]'$  2'[]'$  3'[]'$  4'[]'$  UN'[]'$    5b Motor Arm - Right 0'[]'$  1'[]'$  2'[x]'$  3'[]'$  4'[]'$  UN'[]'$    6a Motor Leg - Left 0'[]'$  1'[]'$  2'[x]'$  3'[]'$  4'[]'$  UN'[]'$    6b Motor Leg - Right 0'[]'$  1'[]'$  2'[]'$  3'[x]'$  4'[]'$  UN'[]'$    7 Limb Ataxia 0'[x]'$  1'[]'$  2'[]'$  3'[]'$  UN'[]'$     8 Sensory 0'[x]'$  1'[]'$  2'[]'$  UN'[]'$      9 Best Language 0'[]'$  1'[]'$  2'[]'$  3'[x]'$      10 Dysarthria 0'[]'$  1'[]'$  2'[x]'$  UN'[]'$      11 Extinct. and Inattention 0'[x]'$  1'[]'$  2'[]'$       TOTAL:  19       ROS:  Unable to obtain due to altered mental status.   Past Medical History:  Diagnosis Date   Anxiety    Arthritis    Cataract    CVA (cerebral vascular accident) ([place]) 03/06/2021   Dizziness    Dysphagia    'sometimes but not a major issue' been checked out by GI (per pt)   Dysrhythmia    'heart used to skip but doesn't anymore' was checked out by Dr. Ron Parker late '90s, everything  checked out ok and not had any skipping since (all per pt)   Essential hypertension, benign    Fatty liver    GERD (gastroesophageal reflux disease)    H/O hypercalcemia 04/12/2017   Hepatitis B    had at age 70, 'GI doc said it's gone away'   Hepatitis B surface antigen positive    High cholesterol    History of hiatal hernia    Hypercalcemia    Hypothyroidism    Kidney stone 12/2012   Patient was [facility] at [facility]   Obesity    Osteoporosis    PONV (postoperative nausea and vomiting)    Type 2 diabetes mellitus ([place])      Family History  Problem Relation Age of Onset   COPD Mother    Heart attack Mother    Stroke Father    Hypertension Father    Hip fracture Father    Cancer Maternal Aunt    Diabetes Maternal Aunt    Diabetes Maternal Uncle    Colon cancer Neg Hx      Social History:  reports that she has never  smoked. She has been exposed to tobacco smoke. She has never used smokeless tobacco. She reports that she does not drink alcohol and does not use drugs.   Prior to Admission medications   Medication Sig Start Date End Date Taking? Authorizing Provider  amLODipine (NORVASC) 10 MG tablet Take 10 mg by mouth daily. 12/09/20   [provider]  aspirin EC 81 MG tablet Take 81 mg by mouth daily. Swallow whole.    [provider]  atenolol (TENORMIN) 25 MG tablet Take 1 tablet (25 mg total) by mouth daily. Patient not taking: Reported on 04/07/2022 03/13/21   Dennison Mascot, PA-C  clonazePAM Bobbye [place]) 1 MG disintegrating tablet Use if seizure lasts longer than 5 minutes Patient not taking: Reported on 04/07/2022 10/30/21   Masters, Joellen Jersey, DO  clopidogrel (PLAVIX) 75 MG tablet Take 1 tablet (75 mg total) by mouth daily. 10/30/21 01/31/23  Masters, Katie, DO  diazePAM (VALTOCO 5 MG DOSE) 5 MG/0.1ML LIQD Spray in nose if seizure last more than 5 minutes. Can repeat dose in 4 hours. Do not use more than twice in 24 hours. Patient not taking: Reported on 04/07/2022 10/30/21   Masters, Joellen Jersey, DO  diphenhydrAMINE (BENADRYL) 25 MG tablet Take 1 tablet (25 mg total) by mouth every 6 (six) hours as needed. Patient taking differently: Take 25 mg by mouth every 6 (six) hours as needed for itching or sleep. 07/17/21   Maudie Flakes, MD  glipiZIDE (GLUCOTROL) 5 MG tablet Take 5 mg by mouth every morning. 02/22/22   [provider]  hydrOXYzine (ATARAX) 50 MG tablet Take 50 mg by mouth at bedtime as needed for itching or anxiety. Patient not taking: Reported on 04/07/2022 07/20/21   [provider]  JARDIANCE 10 MG TABS tablet Take 10 mg by mouth daily. 03/10/22   [provider]  levETIRAcetam (KEPPRA) 250 MG tablet Take 1 tablet (250 mg total) by mouth 2 (two) times daily. 02/03/22   Penumalli, Earlean Polka, MD  levothyroxine (SYNTHROID) 125 MCG tablet Take 1 tablet (125 mcg total)  by mouth daily before breakfast. 01/16/21   Philemon Kingdom, MD  losartan (COZAAR) 100 MG tablet Take 100 mg by mouth daily. 10/01/21   [provider]  meclizine (ANTIVERT) 25 MG tablet Take 25 mg by mouth 2 (two) times daily as needed for dizziness. 09/11/21   [provider]  metFORMIN (GLUCOPHAGE-XR) 500 MG 24 hr tablet Take 500 mg by mouth daily. 10/15/21   [provider]  pantoprazole (PROTONIX) 40 MG tablet Take 1 tablet (40 mg total) by mouth daily before lunch. 08/05/20   Rehman, Mechele Dawley, MD  rosuvastatin (CRESTOR) 40 MG tablet Take 40 mg by mouth at bedtime. 10/02/21   [provider]  triamcinolone cream (KENALOG) 0.1 % Apply 1 application. topically daily as needed (irritation). 07/16/21   [provider]  calcium citrate-vitamin D 200-200 MG-UNIT TABS Take 1 tablet by mouth daily.    08/10/11  [provider]     Exam: Current vital signs: BP 125/71   Ht '5\' 3"'$  (1.6 m)   Wt 40.0 kg   BMI 30.70 kg/m    Physical Exam  Constitutional: Appears well-developed and well-nourished.  Psych: Affect appropriate to situation Eyes: No scleral injection HENT: No OP obstrucion Head: Normocephalic.  Cardiovascular: Normal rate and regular rhythm.  Respiratory: Effort normal and breath sounds normal to anterior ascultation GI: Soft.  No distension. There is no tenderness.  Skin: WDI  Neuro: Mental Status: stuporous Speech: Global aphasia, severe dysarthria. Patient only responding with grunts and saying ow/yeah.  Cranial Nerves: PERRL, (+) corneals, oculocephalics, R UMN facial droop, hearing intact to loud voice Motor: LUE no drift if arm is held up for her, RUE contracted with drift to bed, LLE drift to bed, RLE no movement against gravity Sensory: withdraws to noxious stimuli L>R UE>LE Cerebellar: UTA   I have reviewed labs in epic and the pertinent results are:  Potassium 5.4 Glucose 268 BUN 42 Creatinine 2.00 Ionized Ca  1.11   I have reviewed the images obtained:  CT HEAD: (Personally reviewed)  New 4 mm high-density along the left temporal cortex suggesting acute hemorrhage. No acute infarct noted. Chronic left parieto-occipital and right corona radiata infarcts.  CT ANGIO HEAD & NECK: (Personally reviewed)  No intracranial large vessel occlusion is identified. Intracranial atherosclerotic disease, as outlined and most notably as follows. Severe stenosis within the left PCA P1 segment, new from the prior CTA of 10/28/2021. Redemonstrated moderate/severe stenosis within the right PCA P2 Redemonstrated moderate/severe stenosis within a mid M2 left MCA   Primary Diagnosis: Left Temporal Cortical Intracerebral Hemorrhage  Secondary Diagnosis: HLD, HTN, DM2, Previous CVA   Impression: Lisa Sutton is a 89 y.o. female with past medical hx of HTN, HLD, DM, seizures, and stroke with residual right-sided deficits BIB EMS as a CODE STROKE d/t acute onset of AMS, worsening right-side weakness. She reportedly had an unwitnessed fall before EMS was called. CBG in the field was 346. BP was 151/90.  CT reveals a small temporal ICH. This small ICH does not fully explain her stupor. This finding, combined with her history of seizures, her usual prodrome being witnessed, and her post-ictal like assessment after CT would lead [place] to think probable seizure provoked by the temporal ICH. cEEG is ordered to evaluate.  Diagnosis is ICH, most likely combined with seizure.   Plan: Admit to ICU under Dr. Quinn Axe cEEG to evaluate for seizures No antiplatelets or anticoagulants Blood pressure control with goal systolic [phone], clevidipine prn Frequent neuro checks, per unit protocol Repeat CT head 6 hours post. MRI brain wwo when able (already on EEG, leads are not MR compatible) If symptoms worsen or there is decreased mental status, immediately reactivate stroke  Repeat CMP in 12h to evaluate  electrolytes/hydration status after receiving IVF Daily CBC/BMP PT,OT,ST NPO  Keppra 500 IV BID SCDs for DVT prophylaxis HOB elevated 30 degrees No indication for neurosurgery involvement at this time   Stroke team will assume care in AM.   Pt seen by Neuro NP/APP and later by MD. Note/plan to be edited by MD as needed.    Otelia Santee, DNP, AGACNP-BC [facility] Please use AMION for pager and EPIC for messaging   Neurology Attending Attestation   I examined the patient and discussed plan with Ms. Thressa Sheller NP. Above note has been edited by me to reflect my findings and recommendations. I was present throughout the stroke code and made all significant decisions and personally reviewed CNS imaging and also discussed CTA results with radiologist by phone.   Patient presented with stupor, acute on chronic R sided weakness, BLE weakness. Per son LKW 7:20 AM while she was eating breakfast.  She stated that she did not feel well and had right-sided paresthesias and pain which is her typical seizure aura.  She then walked to her bedroom and son heard a thud, came in and found her on the floor. She was confused and v lethargic. NIHSS = 19 on arrival. Head CT showed small L temporal ICH. CTA no aneurysm or vascular malformation to explain. She does have multifocal intracranial stenosis including new severe stenosis L P1 but unfortunately we have to discontinue her DAPT in the setting of ICH. If she has any worsening in exam stroke code should be immediately reactivated. I suspect she had a seizure provoked in the setting of  L temporal hemorrhage and is post-ictal causing her stupor. EEG showed intermittent slowing L frontotemporal region but no seizures. Will continue her on LTM EEG and f/u CT head to assess stability of ICH at 6 hrs. Remainder of workup and mgmt per plan above.   This patient is critically ill and at significant risk of neurological worsening, death and care requires  constant monitoring of vital signs, hemodynamics,respiratory and cardiac monitoring, neurological assessment, discussion with family, other specialists and medical decision making of high complexity. I spent 90 minutes of neurocritical care time  in the care of  this patient. This was time spent independent of any time provided by nurse practitioner or PA.   Su Monks, MD [facility] [phone]   If 7pm- 7am, please page neurology on call as listed in [place].

## 2022-06-11 ENCOUNTER — Inpatient Hospital Stay (HOSPITAL_COMMUNITY): Payer: Medicare Other

## 2022-06-11 DIAGNOSIS — R569 Unspecified convulsions: Secondary | ICD-10-CM | POA: Diagnosis not present

## 2022-06-11 DIAGNOSIS — I611 Nontraumatic intracerebral hemorrhage in hemisphere, cortical: Secondary | ICD-10-CM | POA: Diagnosis not present

## 2022-06-11 LAB — BASIC METABOLIC PANEL
Anion gap: 6 (ref 5–15)
Anion gap: 7 (ref 5–15)
Anion gap: 10 (ref 5–15)
BUN: 28 mg/dL — ABNORMAL HIGH (ref 8–23)
BUN: 26 mg/dL — ABNORMAL HIGH (ref 8–23)
BUN: 26 mg/dL — ABNORMAL HIGH (ref 8–23)
CO2: 21 mmol/L — ABNORMAL LOW (ref 22–32)
CO2: 20 mmol/L — ABNORMAL LOW (ref 22–32)
CO2: 19 mmol/L — ABNORMAL LOW (ref 22–32)
Calcium: 8.4 mg/dL — ABNORMAL LOW (ref 8.9–10.3)
Calcium: 8.6 mg/dL — ABNORMAL LOW (ref 8.9–10.3)
Calcium: 8.7 mg/dL — ABNORMAL LOW (ref 8.9–10.3)
Chloride: 110 mmol/L (ref 98–111)
Chloride: 110 mmol/L (ref 98–111)
Chloride: 108 mmol/L (ref 98–111)
Creatinine, Ser: 2.15 mg/dL — ABNORMAL HIGH (ref 0.44–1.00)
Creatinine, Ser: 2.12 mg/dL — ABNORMAL HIGH (ref 0.44–1.00)
Creatinine, Ser: 2.04 mg/dL — ABNORMAL HIGH (ref 0.44–1.00)
GFR, Estimated: 22 mL/min — ABNORMAL LOW (ref >60)
GFR, Estimated: 22 mL/min — ABNORMAL LOW (ref >60)
GFR, Estimated: 23 mL/min — ABNORMAL LOW (ref >60)
Glucose, Bld: 118 mg/dL — ABNORMAL HIGH (ref 70–99)
Glucose, Bld: 111 mg/dL — ABNORMAL HIGH (ref 70–99)
Glucose, Bld: 164 mg/dL — ABNORMAL HIGH (ref 70–99)
Potassium: 5.4 mmol/L — ABNORMAL HIGH (ref 3.5–5.1)
Potassium: 5.4 mmol/L — ABNORMAL HIGH (ref 3.5–5.1)
Potassium: 4.8 mmol/L (ref 3.5–5.1)
Sodium: 137 mmol/L (ref 135–145)
Sodium: 137 mmol/L (ref 135–145)
Sodium: 137 mmol/L (ref 135–145)

## 2022-06-11 LAB — CBC WITH DIFFERENTIAL/PLATELET
Abs Immature Granulocytes: 0.08 10*3/uL — ABNORMAL HIGH (ref 0.00–0.07)
Basophils Absolute: 0.1 10*3/uL (ref 0.0–0.1)
Basophils Relative: 1 %
Eosinophils Absolute: 0 10*3/uL (ref 0.0–0.5)
Eosinophils Relative: 0 %
HCT: 36.8 % (ref 36.0–46.0)
Hemoglobin: 11.8 g/dL — ABNORMAL LOW (ref 12.0–15.0)
Immature Granulocytes: 1 %
Lymphocytes Relative: 24 %
Lymphs Abs: 1.6 10*3/uL (ref 0.7–4.0)
MCH: 27.8 pg (ref 26.0–34.0)
MCHC: 32.1 g/dL (ref 30.0–36.0)
MCV: 86.8 fL (ref 80.0–100.0)
Monocytes Absolute: 0.7 10*3/uL (ref 0.1–1.0)
Monocytes Relative: 11 %
Neutro Abs: 4.3 10*3/uL (ref 1.7–7.7)
Neutrophils Relative %: 63 %
Platelets: 226 10*3/uL (ref 150–400)
RBC: 4.24 MIL/uL (ref 3.87–5.11)
RDW: 14.3 % (ref 11.5–15.5)
WBC: 6.8 10*3/uL (ref 4.0–10.5)
nRBC: 0 % (ref 0.0–0.2)

## 2022-06-11 LAB — GLUCOSE, CAPILLARY
Glucose-Capillary: 81 mg/dL (ref 70–99)
Glucose-Capillary: 165 mg/dL — ABNORMAL HIGH (ref 70–99)

## 2022-06-11 MED ORDER — ORAL CARE MOUTH RINSE: 15.0000 mL | OROMUCOSAL | Status: DC | PRN

## 2022-06-11 MED ORDER — GADOBUTROL 1 MMOL/ML IV SOLN
7.5000 mL | Freq: Once | INTRAVENOUS | Status: AC | PRN
  Administered 2022-06-11: 7.5 mL via INTRAVENOUS

## 2022-06-11 MED ORDER — PANTOPRAZOLE SODIUM 40 MG PO TBEC
40.0000 mg | DELAYED_RELEASE_TABLET | Freq: Every day | ORAL | Status: DC
  Administered 2022-06-12 – 2022-06-18 (×7): 40 mg via ORAL
  Filled 2022-06-11 (×7): qty 1

## 2022-06-11 MED ORDER — ORAL CARE MOUTH RINSE
15.0000 mL | OROMUCOSAL | Status: DC
  Administered 2022-06-12 – 2022-06-18 (×23): 15 mL via OROMUCOSAL

## 2022-06-11 MED ORDER — LEVETIRACETAM 250 MG PO TABS: 250.0000 mg | ORAL_TABLET | Freq: Two times a day (BID) | ORAL | Status: DC

## 2022-06-11 MED ORDER — SODIUM ZIRCONIUM CYCLOSILICATE 10 G PO PACK
10.0000 g | PACK | Freq: Once | ORAL | Status: AC
  Administered 2022-06-11: 10 g via ORAL
  Filled 2022-06-11: qty 1

## 2022-06-11 MED ORDER — INSULIN ASPART 100 UNIT/ML IJ SOLN
0.0000 [IU] | Freq: Three times a day (TID) | INTRAMUSCULAR | Status: DC
  Administered 2022-06-11: 1 [IU] via SUBCUTANEOUS
  Administered 2022-06-12 (×2): 2 [IU] via SUBCUTANEOUS
  Administered 2022-06-13 – 2022-06-14 (×5): 1 [IU] via SUBCUTANEOUS
  Administered 2022-06-15: 3 [IU] via SUBCUTANEOUS
  Administered 2022-06-15: 2 [IU] via SUBCUTANEOUS
  Administered 2022-06-16 – 2022-06-17 (×2): 1 [IU] via SUBCUTANEOUS

## 2022-06-11 MED ORDER — CHLORHEXIDINE GLUCONATE CLOTH 2 % EX PADS
6.0000 | MEDICATED_PAD | Freq: Every day | CUTANEOUS | Status: DC
  Administered 2022-06-11 – 2022-06-14 (×3): 6 via TOPICAL

## 2022-06-11 MED ORDER — LEVOTHYROXINE SODIUM 25 MCG PO TABS
125.0000 ug | ORAL_TABLET | Freq: Every day | ORAL | Status: DC
  Administered 2022-06-12 – 2022-06-18 (×7): 125 ug via ORAL
  Filled 2022-06-11 (×7): qty 1

## 2022-06-11 MED ORDER — VITAMIN D 25 MCG (1000 UNIT) PO TABS
1000.0000 [IU] | ORAL_TABLET | Freq: Every day | ORAL | Status: DC
  Administered 2022-06-12 – 2022-06-18 (×7): 1000 [IU] via ORAL
  Filled 2022-06-11 (×7): qty 1

## 2022-06-11 MED ORDER — LEVETIRACETAM 250 MG PO TABS
250.0000 mg | ORAL_TABLET | Freq: Two times a day (BID) | ORAL | Status: DC
  Administered 2022-06-12 – 2022-06-18 (×13): 250 mg via ORAL
  Filled 2022-06-11 (×14): qty 1

## 2022-06-11 NOTE — Progress Notes (Signed)
OT Cancellation Note  Patient Details Name: Lisa Sutton MRN: [id number] DOB: 1934-03-13   Cancelled Treatment:    Reason Eval/Treat Not Completed: Active bedrest order   Layla Maw 06/11/2022, 6:54 AM

## 2022-06-11 NOTE — Progress Notes (Addendum)
STROKE TEAM PROGRESS NOTE   INTERVAL HISTORY Patient is seen in her room with her son at the bedside.  Yesterday, she had an unwitnessed fall with acute onset altered mental status and worsening right-sided weakness.  She does have a history of seizures and may have had a seizure prior to the unwitnessed fall, as she had her normal seizure prodrome of right-sided pain and sensory deficit before falling.  CT head shows small ICH along left temporal. Blood pressure adequately controlled.  Vital signs stable Vitals:   06/11/22 1109 06/11/22 1200 06/11/22 1300 06/11/22 1400  BP:  (!) 141/73 (!) 156/108 (!) 143/77  Pulse:  82 80 84  Resp:  '14 16 20  '$ Temp: (!) 97.4 F (36.3 C)     TempSrc: Oral     SpO2:  92% 95% 95%  Weight:      Height:       CBC:  Recent Labs  Lab 06/10/22 1000 06/11/22 0716  WBC 8.9 6.8  NEUTROABS 5.8 4.3  HGB 12.1  12.6 11.8*  HCT 38.1  37.0 36.8  MCV 87.8 86.8  PLT 234 785   Basic Metabolic Panel:  Recent Labs  Lab 06/11/22 0716 06/11/22 1112  NA 137 137  K 5.4* 5.4*  CL 110 110  CO2 21* 20*  GLUCOSE 118* 111*  BUN 28* 26*  CREATININE 2.15* 2.12*  CALCIUM 8.4* 8.6*   Lipid Panel: No results for input(s): "CHOL", "TRIG", "HDL", "CHOLHDL", "VLDL", "LDLCALC" in the last 168 hours. HgbA1c: No results for input(s): "HGBA1C" in the last 168 hours. Urine Drug Screen: No results for input(s): "LABOPIA", "COCAINSCRNUR", "LABBENZ", "AMPHETMU", "THCU", "LABBARB" in the last 168 hours.  Alcohol Level  Recent Labs  Lab 06/10/22 1030  ETH <10    IMAGING past 24 hours Overnight EEG with video  Result Date: 06/11/2022 Lora Havens, MD     06/11/2022 11:29 AM Patient Name: Lisa Sutton MRN: [id number] Epilepsy Attending: Lora Havens Referring Physician/Provider: Otelia Santee, NP Duration: 06/10/2022 1053 to 06/11/2022 1122 Patient history: 89 year old female with right-sided weakness, altered mental status.  EEG to evaluate for seizure. Level of  alertness: Awake, asleep AEDs during EEG study: LEV Technical aspects: This EEG study was done with scalp electrodes positioned according to the 10-20 International system of electrode placement. Electrical activity was reviewed with band pass filter of 1-'70Hz'$ , sensitivity of 7 uV/mm, display speed of 71m/sec with a '60Hz'$  notched filter applied as appropriate. EEG data were recorded continuously and digitally stored.  Video monitoring was available and reviewed as appropriate. Description: The posterior dominant rhythm consists of 9-10 Hz activity of moderate voltage (25-35 uV) seen predominantly in posterior head regions, symmetric and reactive to eye opening and eye closing. Sleep was characterized by vertex waves, sleep spindles (12 to 14 Hz), maximal frontocentral region. EEG showed intermittent 3-'5Hz'$  theta-delta slowing in left frontotemporal region. Hyperventilation and photic stimulation were not performed.   ABNORMALITY -Intermittent slow, left frontotemporal region. IMPRESSION: This study is suggestive of cortical dysfunction arising from left frontotemporal region likely secondary to underlying structural abnormality/ ICH. No seizures or definite epileptiform discharges were seen throughout the recording. PLora Havens  CT HEAD WO CONTRAST  Result Date: 06/10/2022 CLINICAL DATA:  Stroke, follow up EXAM: CT HEAD WITHOUT CONTRAST TECHNIQUE: Contiguous axial images were obtained from the base of the skull through the vertex without intravenous contrast. RADIATION DOSE REDUCTION: This exam was performed according to the departmental dose-optimization program which includes automated  exposure control, adjustment of the mA and/or kV according to patient size and/or use of iterative reconstruction technique. COMPARISON:  CT head 06/10/2022 FINDINGS: Brain: Patchy and confluent areas of decreased attenuation are noted throughout the deep and periventricular white matter of the cerebral hemispheres  bilaterally, compatible with chronic microvascular ischemic disease. Chronic left parietooccipital and right corona radiata infarction. No evidence of large-territorial acute infarction. Persistent slightly less conspicuous a stable in size hyperdensity measuring approximately 4 mm along the left lateral temporal lobe (3:12). No extra-axial acute fluid. Slight enhancing vasculature due to prior CT angiography head neck 06/10/2022. No mass lesion. No extra-axial collection. No mass effect or midline shift. No hydrocephalus. Basilar cisterns are patent. Vascular: No hyperdense vessel. Atherosclerotic calcifications are present within the cavernous internal carotid and vertebral arteries. Skull: No acute fracture or focal lesion. Sinuses/Orbits: Paranasal sinuses and mastoid air cells are clear. The orbits are unremarkable. Other: None. IMPRESSION: No change with persistent slightly less conspicuous a stable in size hyperdensity measuring approximately 4 mm along the left lateral temporal lobe. Electronically Signed   By: Iven Finn M.D.   On: 06/10/2022 20:18    PHYSICAL EXAM General: Alert, elderly Caucasian lady in no acute distress Respiratory: Regular, unlabored respirations  NEURO:  Mental Status: AA&Ox3, able to follow one-step commands but has difficulty with two-step commands Speech/Language: speech is with some word finding difficulties, naming and repetition impaired  Cranial Nerves:  II: PERRL.  III, IV, VI: EOMI. Eyelids elevate symmetrically.  V: Sensation is intact to light touch and symmetrical to face.  VII: Smile is symmetrical.  VIII: hearing intact to voice. IX, X: Phonation is normal.  PY:[id number] shrug 5/5. XII: tongue is midline without fasciculations. Motor: 5 out of 5 strength to left upper extremity, 4+ out of 5 strength to right upper extremity, 5 out of 5 strength to bilateral lower extremities Tone: is normal and bulk is normal Sensation- Intact to light touch  bilaterally.  Coordination: FTN intact bilaterally.No drift.  Gait- deferred   ASSESSMENT/PLAN Lisa Sutton is a 89 y.o. female with history of seizures, hypertension, hyperlipidemia, diabetes and left MCA stroke presenting after she had an unwitnessed fall with acute onset altered mental status and worsening right-sided weakness.  She does have a history of seizures and may have had a seizure prior to the unwitnessed fall, as she had her normal seizure prodrome of right-sided pain and sensory deficit before falling.  CT head shows small ICH along left temporal cortex.  ICH score 0  ICH: Small ICH along left temporal cortex etiology indeterminate likely micro vascular disease from hypertension versus amyloid Code Stroke CT head 4 mm ICH along left temporal cortex CTA head & neck no LVO, severe stenosis in left PCA P1 segment, moderate/severe stenosis in right PCA P2 segment and moderate/severe stenosis in left MCA M2 segment MRI no acute infarct noted 2D Echo EF 55 to 71%, grade 1 diastolic dysfunction, mild LVH, mildly to moderately dilated left atrium, interatrial septum not well-visualized LDL 30 HgbA1c 7.2 VTE prophylaxis -SCDs    Diet   Diet NPO time specified   aspirin 81 mg daily and clopidogrel 75 mg daily prior to admission, now on No antithrombotic secondary to [place] Therapy recommendations: Pending Disposition: Pending  Seizure disorder Patient has history of seizures and had typical seizure prodrome prior to unwitnessed fall before admission yesterday Overnight EEG shows cortical dysfunction arising from left frontotemporal region but no seizure activity Continue Keppra 500 mg every 12  hours  Hypertension Home meds: Losartan 100 mg daily Stable Keep systolic blood pressure less than 160 Long-term BP goal normotensive  Hyperlipidemia Home meds: Rosuvastatin 40 mg daily LDL 30, goal < 70 Resume statin at discharge  Diabetes type II Uncontrolled Home meds:  Glipizide 5 mg daily, Jardiance 10 mg daily, metformin 500 mg daily HgbA1c 7.2, goal < 7.0 CBGs Recent Labs    06/10/22 0952 06/10/22 1720 06/10/22 1945  GLUCAP 273* 81 85    SSI  Other Stroke Risk Factors Advanced Age >/= 73  Obesity, Body mass index is 30.7 kg/m., BMI >/= 30 associated with increased stroke risk, recommend weight loss, diet and exercise as appropriate  Hx stroke  Other Active Problems Hyperkalemia-K at 5.4, EKG within normal limits, will give Lokelma 10 g x 1 and recheck BMP this evening  Hospital day # [place] , MSN, AGACNP-BC [facility] See Amion for schedule and pager information 06/11/2022 3:27 PM   STROKE MD NOTE  I have personally obtained history,examined this patient, reviewed notes, independently viewed imaging studies, participated in medical decision making and plan of care.ROS completed by me personally and pertinent positives fully documented  I have made any additions or clarifications directly to the above note. Agree with note above.  She presented with unwitnessed fall followed by altered mental status and right sided weakness possibly unwitnessed seizure but CT scan shows small punctate left temporal lobar hematoma of undetermined etiology.  Possibilities include hypertensive microvascular disease or amyloid angiopathy.  CT angiogram shows moderate to severe stenosis in the left mid M2 MCA but likely unrelated to this hemorrhage.  Recommend close blood pressure monitoring with systolic goal [phone] for the first 24 hours and then below 160.  Continue Keppra for seizures.  Discontinue long-term EEG as it has been normal without epileptiform activity.  Check MRI scan of the brain later today.  Mobilize out of bed.  Physical occupational and speech therapy consults.  Long discussion with patient and family at the bedside and answered questions.This patient is critically ill and at significant risk of neurological worsening,  death and care requires constant monitoring of vital signs, hemodynamics,respiratory and cardiac monitoring, extensive review of multiple databases, frequent neurological assessment, discussion with family, other specialists and medical decision making of high complexity.I have made any additions or clarifications directly to the above note.This critical care time does not reflect procedure time, or teaching time or supervisory time of PA/NP/Med Resident etc but could involve care discussion time.  I spent 30 minutes of neurocritical care time  in the care of  this patient.      Antony Contras, MD Medical Director [facility] Pager: [phone] 06/11/2022 4:25 PM   To contact Stroke Continuity provider, please refer to [email or web address] After hours, contact General Neurology

## 2022-06-11 NOTE — Progress Notes (Signed)
[facility] swallow screen performed per speech. Pt passed the swallow screen w/o complications and no further recommendations from speech

## 2022-06-11 NOTE — Inpatient Diabetes Management (Signed)
Inpatient Diabetes Program Recommendations  AACE/ADA: New Consensus Statement on Inpatient Glycemic Control (2015)  Target Ranges:  Prepandial:   less than 140 mg/dL      Peak postprandial:   less than 180 mg/dL (1-2 hours)      Critically ill patients:  140 - 180 mg/dL   Lab Results  Component Value Date   GLUCAP 85 06/10/2022   HGBA1C 7.2 (H) 10/28/2021    Review of Glycemic Control  Latest Reference Range & Units 06/10/22 09:52 06/10/22 17:20 06/10/22 19:45  Glucose-Capillary 70 - 99 mg/dL 273 (H) 81 85  (H): Data is abnormally high Diabetes history: DM2 Outpatient Diabetes medications: Glucotrol 5 mg qd, Jardiance 10 qd, Metformin 500 mg qd Current orders for Inpatient glycemic control: None yet   Inpatient Diabetes Program Recommendations:    -Add Glycemic control order set with 0-6 units q 4 hrs. While NPO  Thanks, Bronson Curb, MSN, RNC-OB Diabetes Coordinator [phone] (8a-5p)

## 2022-06-11 NOTE — Progress Notes (Signed)
SLP Cancellation Note  Patient Details Name: Lisa Sutton MRN: [id number] DOB: Nov 15, 1933   Cancelled treatment:       Reason Eval/Treat Not Completed: Other (comment) (Pt's case discussed with Nikki Dom, RN who advised that the pt has passed the [place] twice (once on 1/18 and then again on 1/19). A formal SLP swallow evaluation is therefore not necessary per protocol. It was agreed that nursing will reach out to MD regarding regarding initiating a diet, and a diet has since been ordered; SLP will follow for speech-language-cognition evaluation on a subsequent date, but swallow evaluation orders will be completed.)  [name] I. Hardin Negus, [name], [name] Office number [phone]  [name] 06/11/2022, 4:47 PM

## 2022-06-11 NOTE — Procedures (Addendum)
Patient Name: Lisa Sutton  MRN: [id number]  Epilepsy Attending: Lora Havens  Referring Physician/Provider: Otelia Santee, NP  Duration: 06/10/2022 1053 to 06/11/2022 1122  Patient history: 89 year old female with right-sided weakness, altered mental status.  EEG to evaluate for seizure.  Level of alertness: Awake, asleep  AEDs during EEG study: LEV  Technical aspects: This EEG study was done with scalp electrodes positioned according to the 10-20 International system of electrode placement. Electrical activity was reviewed with band pass filter of 1-'70Hz'$ , sensitivity of 7 uV/mm, display speed of 60m/sec with a '60Hz'$  notched filter applied as appropriate. EEG data were recorded continuously and digitally stored.  Video monitoring was available and reviewed as appropriate.  Description: The posterior dominant rhythm consists of 9-10 Hz activity of moderate voltage (25-35 uV) seen predominantly in posterior head regions, symmetric and reactive to eye opening and eye closing. Sleep was characterized by vertex waves, sleep spindles (12 to 14 Hz), maximal frontocentral region. EEG showed intermittent 3-'5Hz'$  theta-delta slowing in left frontotemporal region. Hyperventilation and photic stimulation were not performed.     ABNORMALITY -Intermittent slow, left frontotemporal region.  IMPRESSION: This study is suggestive of cortical dysfunction arising from left frontotemporal region likely secondary to underlying structural abnormality/ ICH. No seizures or definite epileptiform discharges were seen throughout the recording.  Suzy Kugel OBarbra Sarks

## 2022-06-11 NOTE — Progress Notes (Signed)
vLTM discontinued.  No skin breakdown noted at all skin sites.  [facility] notified 

## 2022-06-11 NOTE — Progress Notes (Signed)
PT Cancellation Note  Patient Details Name: Lisa Sutton MRN: [id number] DOB: 02-Sep-1933   Cancelled Treatment:    Reason Eval/Treat Not Completed: Active bedrest order   Alvira Philips 06/11/2022, 8:37 AM Celise Bazar M,PT Acute Rehab Services [phone]

## 2022-06-11 NOTE — Progress Notes (Signed)
Pt stable for MRI

## 2022-06-12 DIAGNOSIS — I10 Essential (primary) hypertension: Secondary | ICD-10-CM

## 2022-06-12 DIAGNOSIS — N184 Chronic kidney disease, stage 4 (severe): Secondary | ICD-10-CM

## 2022-06-12 DIAGNOSIS — Z87898 Personal history of other specified conditions: Secondary | ICD-10-CM | POA: Diagnosis not present

## 2022-06-12 DIAGNOSIS — I611 Nontraumatic intracerebral hemorrhage in hemisphere, cortical: Secondary | ICD-10-CM | POA: Diagnosis not present

## 2022-06-12 DIAGNOSIS — S0636AA Traumatic hemorrhage of cerebrum, unspecified, with loss of consciousness status unknown, initial encounter: Secondary | ICD-10-CM

## 2022-06-12 DIAGNOSIS — Z8673 Personal history of transient ischemic attack (TIA), and cerebral infarction without residual deficits: Secondary | ICD-10-CM | POA: Diagnosis not present

## 2022-06-12 LAB — CBC
HCT: 34 % — ABNORMAL LOW (ref 36.0–46.0)
Hemoglobin: 10.9 g/dL — ABNORMAL LOW (ref 12.0–15.0)
MCH: 27.7 pg (ref 26.0–34.0)
MCHC: 32.1 g/dL (ref 30.0–36.0)
MCV: 86.3 fL (ref 80.0–100.0)
Platelets: 219 10*3/uL (ref 150–400)
RBC: 3.94 MIL/uL (ref 3.87–5.11)
RDW: 14 % (ref 11.5–15.5)
WBC: 7.4 10*3/uL (ref 4.0–10.5)
nRBC: 0 % (ref 0.0–0.2)

## 2022-06-12 LAB — BASIC METABOLIC PANEL
Anion gap: 9 (ref 5–15)
BUN: 25 mg/dL — ABNORMAL HIGH (ref 8–23)
CO2: 19 mmol/L — ABNORMAL LOW (ref 22–32)
Calcium: 8.5 mg/dL — ABNORMAL LOW (ref 8.9–10.3)
Chloride: 110 mmol/L (ref 98–111)
Creatinine, Ser: 1.98 mg/dL — ABNORMAL HIGH (ref 0.44–1.00)
GFR, Estimated: 24 mL/min — ABNORMAL LOW (ref >60)
Glucose, Bld: 115 mg/dL — ABNORMAL HIGH (ref 70–99)
Potassium: 4.7 mmol/L (ref 3.5–5.1)
Sodium: 138 mmol/L (ref 135–145)

## 2022-06-12 LAB — GLUCOSE, CAPILLARY
Glucose-Capillary: 127 mg/dL — ABNORMAL HIGH (ref 70–99)
Glucose-Capillary: 210 mg/dL — ABNORMAL HIGH (ref 70–99)

## 2022-06-12 MED ORDER — ROSUVASTATIN CALCIUM 20 MG PO TABS
20.0000 mg | ORAL_TABLET | Freq: Every day | ORAL | Status: DC
  Administered 2022-06-13 – 2022-06-17 (×5): 20 mg via ORAL
  Filled 2022-06-12 (×5): qty 1

## 2022-06-12 NOTE — Evaluation (Signed)
Occupational Therapy Evaluation Patient Details Name: Lisa Sutton MRN: [id number] DOB: 1933-07-01 Today's Date: 06/12/2022   History of Present Illness 89 y.o. female presents to [facility] hospital [facility]/06/2021 with aphasia and fall. + for small ICH along left temporal cortex etiology indeterminate likely micro vascular disease from hypertension versus amyloid. PMH includes HTN, GERD, Hep B, HLD, Obesity, osteoporosis.   Clinical Impression   PTA, pt lived with son who assisted with IADL. Upon eval, pt presents with decreased cognition, safety, awareness, balance, coordination, and strength. Pt requires up to min A for LB ADL and functional mobility during session. Following basic commands with increased time and intermittent visual cues. Due to need for up to min A during functional mobility and decreased safety awareness as well as judgement, pt will require 24/7 supervision at this time. Son present in session, and agreeable continued rehabilitation prior to return home is needed. Recommending continued OT at SNF to optimize safety and independence in ADL and IADL.      Recommendations for follow up therapy are one component of a multi-disciplinary discharge planning process, led by the attending physician.  Recommendations may be updated based on patient status, additional functional criteria and insurance authorization.   Follow Up Recommendations  Skilled nursing-short term rehab (<3 hours/day)     Assistance Recommended at Discharge Frequent or constant Supervision/Assistance  Patient can return home with the following A little help with walking and/or transfers;A little help with bathing/dressing/bathroom;Assistance with cooking/housework;Direct supervision/assist for medications management;Direct supervision/assist for financial management;Assist for transportation;Help with stairs or ramp for entrance    Functional Status Assessment  Patient has had a recent decline in their functional  status and demonstrates the ability to make significant improvements in function in a reasonable and predictable amount of time.  Equipment Recommendations  None recommended by OT    Recommendations for Other Services       Precautions / Restrictions Precautions Precautions: Fall;Other (comment) Precaution Comments: seizure Restrictions Weight Bearing Restrictions: No      Mobility Bed Mobility               General bed mobility comments: in recliner on arrival and departure    Transfers Overall transfer level: Needs assistance Equipment used: Rolling walker (2 wheels), None Transfers: Sit to/from Stand Sit to Stand: Min assist           General transfer comment: Min guard-min A      Balance Overall balance assessment: Needs assistance Sitting-balance support: No upper extremity supported, Feet supported Sitting balance-Leahy Scale: Good     Standing balance support: No upper extremity supported, During functional activity Standing balance-Leahy Scale: Fair Standing balance comment: Stood at sink to wash hands. touching support intermittently.                           ADL either performed or assessed with clinical judgement   ADL Overall ADL's : Needs assistance/impaired Eating/Feeding: Set up;Sitting   Grooming: Minimal assistance;Oral care;Standing Grooming Details (indicate cue type and reason): Min A for opening toothpaste and problem solving Upper Body Bathing: Set up;Sitting   Lower Body Bathing: Minimal assistance;Sit to/from stand   Upper Body Dressing : Set up;Sitting   Lower Body Dressing: Minimal assistance;Sit to/from stand   Toilet Transfer: Minimal assistance;Ambulation;Rolling walker (2 wheels);Comfort height toilet           Functional mobility during ADLs: Minimal assistance General ADL Comments: Min A for balance without RW  Vision Ability to See in Adequate Light: 1 Impaired Patient Visual Report: No  change from baseline Additional Comments: Decreased R peripheral vision at baseline. Pt does overshoot toward R during ADL. Son reporting he thinks this may be new, but pt reporting vision has not changed since prior CVA     Perception     Praxis      Pertinent Vitals/Pain Pain Assessment Pain Assessment: No/denies pain     Hand Dominance Right   Extremity/Trunk Assessment Upper Extremity Assessment Upper Extremity Assessment: Generalized weakness   Lower Extremity Assessment Lower Extremity Assessment: Defer to PT evaluation       Communication Communication Communication: Expressive difficulties;HOH (some expressive difficulty at baseline, but son appeared to believe it was worse?)   Cognition Arousal/Alertness: Awake/alert Behavior During Therapy: WFL for tasks assessed/performed Overall Cognitive Status: Difficult to assess Area of Impairment: Orientation, Following commands, Safety/judgement, Problem solving                 Orientation Level: Disoriented to, Time     Following Commands: Follows one step commands inconsistently Safety/Judgement: Decreased awareness of safety, Decreased awareness of deficits   Problem Solving: Slow processing, Difficulty sequencing, Requires verbal cues, Requires tactile cues General Comments: Brushing teeth min guard A, but cues to initiate. Pt with difficulty finding words and occasional difficulty following commands. Pt unable to follow more than one step commands.     General Comments       Exercises     Shoulder Instructions      Home Living Family/patient expects to be discharged to:: Private residence Living Arrangements: Children Available Help at Discharge: Family;Available PRN/intermittently Type of Home: House Home Access: Stairs to enter [employer] of Steps: 2 Entrance Stairs-Rails: Right;Left;Can reach both Home Layout: Two level;Able to live on main level with bedroom/bathroom     Bathroom  Shower/Tub: Occupational psychologist: Standard     Home Equipment: Conservation officer, nature (2 wheels);Cane - single point;BSC/3in1;Shower seat - built in;Grab bars - tub/shower   Additional Comments: Son present and confirming history      Prior Functioning/Environment Prior Level of Function : Needs assist             Mobility Comments: Ambulates without AD. Hx falls ADLs Comments: Able to perform ADLs independently. Son cooks, performs med Loss adjuster, chartered, Doctor, hospital, and drives        OT Problem List: Decreased strength;Impaired balance (sitting and/or standing);Decreased activity tolerance;Decreased coordination;Decreased cognition;Decreased safety awareness;Decreased knowledge of use of DME or AE      OT Treatment/Interventions: Self-care/ADL training;Therapeutic exercise;DME and/or AE instruction;Therapeutic activities;Patient/family education;Balance training;Cognitive remediation/compensation    OT Goals(Current goals can be found in the care plan section) Acute Rehab OT Goals Patient Stated Goal: go home OT Goal Formulation: With patient Time For Goal Achievement: 06/26/22 Potential to Achieve Goals: Good  OT Frequency: Min 2X/week    Co-evaluation              AM-PAC OT "6 Clicks" Daily Activity     Outcome Measure Help from another person eating meals?: A Little Help from another person taking care of personal grooming?: A Little Help from another person toileting, which includes using toliet, bedpan, or urinal?: A Little Help from another person bathing (including washing, rinsing, drying)?: A Little Help from another person to put on and taking off regular upper body clothing?: A Little Help from another person to put on and taking off regular lower body clothing?: A Little 6  Click Score: 18   End of Session Equipment Utilized During Treatment: Gait belt Nurse Communication: Mobility status  Activity Tolerance: Patient tolerated treatment  well Patient left: in chair;with call bell/phone within reach;with chair alarm set;with family/visitor present  OT Visit Diagnosis: Unsteadiness on feet (R26.81);Muscle weakness (generalized) (M62.81);Other symptoms and signs involving cognitive function;Cognitive communication deficit (R41.841)                Time: 6948-5462 OT Time Calculation (min): 32 min Charges:  OT General Charges $OT Visit: 1 Visit OT Evaluation $OT Eval Moderate Complexity: 1 Mod OT Treatments $Self Care/Home Management : 8-22 mins  Elder Cyphers, OTR/L [facility] Acute Rehabilitation Office: [phone]   Magnus Ivan 06/12/2022, 3:06 PM

## 2022-06-12 NOTE — Progress Notes (Addendum)
STROKE TEAM PROGRESS NOTE   ATTENDING NOTE: I reviewed above note and agree with the assessment and plan. Pt was seen and examined.   89 year old female with history of hypertension, hyperlipidemia, diabetes, stroke, seizure, Hepatitis B, obesity admitted for fall at home with altered mental status and increased right-sided weakness.  CT and MRI showed tiny left temporal ICH.  CT head and neck left P1 severe stenosis, moderate to severe right P2 and the left M2 stenosis.  EF 55 to 60%.  LDL 30, A1c 7.2.  EEG no seizure.  Creatinine 2.04.  Loop recorder interrogation reported no A-fib.  History of stroke and seizure 02/2021 admitted for left MCA infarct with left M2 occlusion.  CTP 8/54.  Status post IR with TICI3 and interventional radiologist concerning for ICAD given bilateral M2 stenosis.  MRI postprocedure showed continued left M2 occlusion with bilateral M2 stenosis.  EF 60 to 65% no DVT.  LDL 123, A1c 6.2.  Discharged on DAPT for 3 months. 10/2021 admitted for right leg/arm/face twitching.  Status post Ativan and Keppra.  MRI showed left frontal parietal 2 punctate infarcts.  EEG showed left temporal epileptogenicity with spikes.  Discharged on DAPT and statin and Keppra. Loop recorder placed 03/2022 so far no A-fib.  On exam, son is at the bedside, pt is awake, alert, eyes open, orientated to age, place and people but not to time. No aphasia, fluent language, following all simple commands but hard of hearing. Able to name and repeat. No gaze palsy, tracking bilaterally, visual field full. No facial droop. Tongue midline. Bilateral UEs 5/5, no drift. Bilaterally LEs 5/5, no drift. Sensation symmetrical bilaterally, b/l FTN intact, gait not tested.   Etiology for patient tiny left temporal ICH not quite clear, could be hypertension related, small vessel disease versus possible CAA.  Will repeat CT in a.m., if stable will consider aspirin 81 given history of stroke.  Continue Keppra home dose for  seizure prevention.  Decrease home Crestor 40 to 20 given relatively low LDL level.  PT/OT recommend SNF.  Will follow.  For detailed assessment and plan, please refer to above/below as I have made changes wherever appropriate.   Rosalin Hawking, MD PhD Stroke Neurology 06/12/2022 5:13 PM    INTERVAL HISTORY Patient is seen in her room with her son at the bedside.  She has been hemodynamically stable overnight, and her neurological exam is unchanged.  Will plan for CT in the morning to evaluate ICH.  Recent transmission from loop recorder reveals no atrial fibrillation but occasional PVCs. Vitals:   06/12/22 0400 06/12/22 0440 06/12/22 0838 06/12/22 1154  BP: 111/89 134/70 137/81 136/71  Pulse: 82 81 77 96  Resp:  '16 17 17  '$ Temp:  97.6 F (36.4 C) 97.6 F (36.4 C) 98.1 F (36.7 C)  TempSrc:  Oral Oral Oral  SpO2: 95% 99% 98% 96%  Weight:      Height:       CBC:  Recent Labs  Lab 06/10/22 1000 06/11/22 0716 06/12/22 0150  WBC 8.9 6.8 7.4  NEUTROABS 5.8 4.3  --   HGB 12.1  12.6 11.8* 10.9*  HCT 38.1  37.0 36.8 34.0*  MCV 87.8 86.8 86.3  PLT 234 226 462    Basic Metabolic Panel:  Recent Labs  Lab 06/11/22 1820 06/12/22 0150  NA 137 138  K 4.8 4.7  CL 108 110  CO2 19* 19*  GLUCOSE 164* 115*  BUN 26* 25*  CREATININE 2.04* 1.98*  CALCIUM 8.7*  8.5*    Lipid Panel: No results for input(s): "CHOL", "TRIG", "HDL", "CHOLHDL", "VLDL", "LDLCALC" in the last 168 hours. HgbA1c: No results for input(s): "HGBA1C" in the last 168 hours. Urine Drug Screen: No results for input(s): "LABOPIA", "COCAINSCRNUR", "LABBENZ", "AMPHETMU", "THCU", "LABBARB" in the last 168 hours.  Alcohol Level  Recent Labs  Lab 06/10/22 1030  ETH <10     IMAGING past 24 hours MR BRAIN W WO CONTRAST  Result Date: 06/11/2022 CLINICAL DATA:  History of seizures and remote left MCA stroke presents with altered mental status and worsening right-sided weakness. EXAM: MRI HEAD WITHOUT AND WITH CONTRAST  TECHNIQUE: Multiplanar, multiecho pulse sequences of the brain and surrounding structures were obtained without and with intravenous contrast. CONTRAST:  7.13m GADAVIST GADOBUTROL 1 MMOL/ML IV SOLN COMPARISON:  CT/CTA head and neck 1 day prior FINDINGS: Brain: There is no evidence of acute infarct There is a small focus of SWI signal dropout with associated FLAIR signal abnormality in the left anterior temporal lobe corresponding to the hyperdensity seen on prior CT consistent with a small focus of hemorrhage measuring approximately 4 mm. There is no significant mass effect. There is no associated enhancement. There is no other acute intracranial hemorrhage. Parenchymal volume is stable. The ventricles are stable in size. The remote infarct in the left parietal lobe in the MCA distribution with associated encephalomalacia, gliosis, and cortical laminar necrosis is unchanged. There is unchanged ex vacuo dilatation of the right lateral ventricle. Additional remote infarcts in the right corona radiata and background advanced chronic small-vessel ischemic change are stable. The hippocampi are unremarkable. The pituitary and suprasellar region are normal. There is no mass lesion or abnormal enhancement. There is no mass effect or midline shift. Vascular: Normal flow voids. Skull and upper cervical spine: Normal marrow signal. Sinuses/Orbits: There is mucosal thickening in the right maxillary sinus. Bilateral lens implants are in place. The globes and orbits are otherwise unremarkable. There are bilateral mastoid effusions, nonspecific and also present on the prior study from 2023. Other: None. IMPRESSION: 1. Small focus of hemorrhage in the left anterior temporal lobe is similar to the prior CT, allowing for differences in modality. No significant mass effect. 2. No other acute intracranial pathology. Electronically Signed   By: PValetta MoleM.D.   On: 06/11/2022 15:18    PHYSICAL EXAM General: Alert, elderly  Caucasian lady in no acute distress Respiratory: Regular, unlabored respirations  NEURO:  Mental Status: AA&Ox3, able to follow one-step commands but has difficulty with two-step commands Speech/Language: speech is with some word finding difficulties  Cranial Nerves:  II: PERRL.  III, IV, VI: EOMI. Eyelids elevate symmetrically.  V: Sensation is intact to light touch and symmetrical to face.  VII: Smile is symmetrical.  VIII: hearing intact to voice. IX, X: Phonation is normal.  XEH:MCNOBSJGshrug 5/5. XII: tongue is midline without fasciculations. Motor: 5 out of 5 strength to left upper extremity, 5 out of 5 strength to right upper extremity, 5 out of 5 strength to bilateral lower extremities Tone: is normal and bulk is normal Sensation- Intact to light touch bilaterally.  Coordination: FTN intact bilaterally.No drift.  Gait- deferred   ASSESSMENT/PLAN Ms. BAIRIEL OBLINGERis a 89y.o. female with history of seizures, hypertension, hyperlipidemia, diabetes and left MCA stroke presenting after she had an unwitnessed fall with acute onset altered mental status and worsening right-sided weakness.  She does have a history of seizures and may have had a seizure prior to the unwitnessed  fall, as she had her normal seizure prodrome of right-sided pain and sensory deficit before falling.  CT head shows small ICH along left temporal cortex.  ICH score 0  ICH: Small ICH along left temporal cortex etiology indeterminate likely micro vascular disease from hypertension versus amyloid Code Stroke CT head 4 mm ICH along left temporal cortex CTA head & neck no LVO, severe stenosis in left PCA P1 segment, moderate/severe stenosis in right PCA P2 segment and moderate/severe stenosis in left MCA M2 segment MRI no acute infarct noted Follow-up CT 1/21 pending 2D Echo EF 55 to 96%, grade 1 diastolic dysfunction, mild LVH, mildly to moderately dilated left atrium, interatrial septum not  well-visualized LDL 30 HgbA1c 7.2 Loop recorder reports no A-fib VTE prophylaxis -SCDs aspirin 81 mg daily and clopidogrel 75 mg daily prior to admission, now on No antithrombotic secondary to [place].  Therapy recommendations: Pending Disposition: Pending  Seizure disorder Patient has history of seizures and had typical seizure prodrome prior to unwitnessed fall before admission yesterday Overnight EEG shows cortical dysfunction arising from left frontotemporal region but no seizure activity Continue home Keppra 250 mg every 12 hours  Hypertension Home meds: Losartan 100 mg daily Stable Keep systolic blood pressure less than 160 Long-term BP goal normotensive  Hyperlipidemia Home meds: Rosuvastatin 40 mg daily LDL 30, goal < 70 Resume statin at discharge  Diabetes type II Uncontrolled Home meds: Glipizide 5 mg daily, Jardiance 10 mg daily, metformin 500 mg daily HgbA1c 7.2, goal < 7.0 CBGs SSI  Other Stroke Risk Factors Advanced Age >/= 84  Obesity, Body mass index is 30.7 kg/m., BMI >/= 30 associated with increased stroke risk, recommend weight loss, diet and exercise as appropriate  Hx stroke  Other Active Problems Hyperkalemia-K at 5.4 on 1/19, EKG within normal limits, will give Lokelma 10 g x 1 and recheck BMP this evening-potassium 4.7 this morning  Hospital day # [place] , MSN, AGACNP-BC [facility] See Amion for schedule and pager information 06/12/2022 12:15 PM     To contact Stroke Continuity provider, please refer to [email or web address] After hours, contact General Neurology

## 2022-06-12 NOTE — Evaluation (Addendum)
Physical Therapy Evaluation Patient Details Name: Lisa Sutton MRN: [id number] DOB: 10-27-1933 Today's Date: 06/12/2022  History of Present Illness  89 y.o. female presents to [facility] hospital 06/10/22 with aphasia and fall. CT + for small ICH along left temporal cortex etiology indeterminate likely micro vascular disease from hypertension versus amyloid. PMH includes HTN, GERD, Hep B, HLD, Obesity, osteoporosis.  Clinical Impression  Pt admitted with above diagnosis. Requires min assist for mobility including ambulating with a RW. Mod I at baseline per report, without assistive device. Pt shows reduced awareness of deficits and safety. Difficulty performing simple tasks at the sink. Would need 24/7 supervision in given state to prevent additional falls. Son can provide intermittent supervision but works part time. Agreeable to short term rehab to improve her independence before returning home. Will progress as tolerated during admission.  Pt currently with functional limitations due to the deficits listed below (see PT Problem List). Pt will benefit from skilled PT to increase their independence and safety with mobility to allow discharge to the venue listed below.          Recommendations for follow up therapy are one component of a multi-disciplinary discharge planning process, led by the attending physician.  Recommendations may be updated based on patient status, additional functional criteria and insurance authorization.  Follow Up Recommendations Skilled nursing-short term rehab (<3 hours/day) Can patient physically be transported by private vehicle: Yes    Assistance Recommended at Discharge Frequent or constant Supervision/Assistance  Patient can return home with the following  A little help with walking and/or transfers;A little help with bathing/dressing/bathroom;Assistance with cooking/housework;Direct supervision/assist for medications management;Direct supervision/assist for financial  management;Assist for transportation;Help with stairs or ramp for entrance    Equipment Recommendations None recommended by PT  Recommendations for Other Services       Functional Status Assessment Patient has had a recent decline in their functional status and demonstrates the ability to make significant improvements in function in a reasonable and predictable amount of time.     Precautions / Restrictions Precautions Precautions: Fall;Other (comment) Precaution Comments: seizure Restrictions Weight Bearing Restrictions: No      Mobility  Bed Mobility               General bed mobility comments: On commode when PT entered room.    Transfers Overall transfer level: Needs assistance Equipment used: Rolling walker (2 wheels), None Transfers: Sit to/from Stand Sit to Stand: Min assist           General transfer comment: Min assist for balance from Toilet. Practiced from bed, progressed to min guard level with and without AD however greater stability with use of RW for support.    Ambulation/Gait Ambulation/Gait assistance: Min assist Gait Distance (Feet): 75 Feet Assistive device: Rolling walker (2 wheels) Gait Pattern/deviations: Step-through pattern, Decreased stride length, Drifts right/left Gait velocity: slow Gait velocity interpretation: <1.31 ft/sec, indicative of household ambulator   General Gait Details: Frequent cues for RW use, often neglecting and pushes away. Min assist for balance and RW control while ambulating, cues for closer proximity to device and upright stance within RW BOS. No buckling noted.  Stairs            Wheelchair Mobility    Modified Rankin (Stroke Patients Only) Modified Rankin (Stroke Patients Only) Pre-Morbid Rankin Score: No significant disability Modified Rankin: Moderately severe disability     Balance Overall balance assessment: Needs assistance Sitting-balance support: No upper extremity supported, Feet  supported Sitting balance-Leahy  Scale: Good     Standing balance support: No upper extremity supported, During functional activity Standing balance-Leahy Scale: Fair Standing balance comment: Stood at sink to wash hands. touching support intermittently.                             Pertinent Vitals/Pain Pain Assessment Pain Assessment: No/denies pain    Home Living Family/patient expects to be discharged to:: Private residence Living Arrangements: Children Available Help at Discharge: Family;Available 24 hours/day Type of Home: House Home Access: Stairs to enter Entrance Stairs-Rails: Right;Left;Can reach both Entrance Stairs-Number of Steps: 2   Home Layout: Two level;Able to live on main level with bedroom/bathroom Home Equipment: Rolling Walker (2 wheels);Cane - single point;BSC/3in1;Shower seat - built in;Grab bars - tub/shower Additional Comments: Questionable history - some information taken from prior admission    Prior Function Prior Level of Function : Needs assist             Mobility Comments: Ambulates without AD. Hx falls ADLs Comments: Able to perform ADLs independently. Son cooks and Manufacturing engineer Dominance   Dominant Hand: Right    Extremity/Trunk Assessment   Upper Extremity Assessment Upper Extremity Assessment: Defer to OT evaluation    Lower Extremity Assessment Lower Extremity Assessment: Overall WFL for tasks assessed       Communication   Communication: Expressive difficulties;HOH (expressive difficulty at baseline)  Cognition Arousal/Alertness: Awake/alert Behavior During Therapy: WFL for tasks assessed/performed Overall Cognitive Status: No family/caregiver present to determine baseline cognitive functioning Area of Impairment: Orientation, Following commands, Safety/judgement, Problem solving                 Orientation Level: Disoriented to, Time     Following Commands: Follows one step commands  inconsistently Safety/Judgement: Decreased awareness of safety, Decreased awareness of deficits   Problem Solving: Slow processing, Difficulty sequencing, Requires verbal cues, Requires tactile cues General Comments: Neglecting RW despite cues, difficulty figuring out how to use soap dispenser, turn water on/off.        General Comments General comments (skin integrity, edema, etc.): Pt with difficulty figuring out how to perform basic tasks like getting soap, paper towels, and turning water on/off at sink.    Exercises     Assessment/Plan    PT Assessment Patient needs continued PT services  PT Problem List Decreased strength;Decreased activity tolerance;Decreased balance;Decreased mobility;Decreased cognition;Decreased knowledge of use of DME;Decreased safety awareness;Decreased knowledge of precautions       PT Treatment Interventions DME instruction;Gait training;Therapeutic activities;Functional mobility training;Therapeutic exercise;Balance training;Neuromuscular re-education;Patient/family education;Cognitive remediation    PT Goals (Current goals can be found in the Care Plan section)  Acute Rehab PT Goals Patient Stated Goal: go home; son would prefer rehab if needed PT Goal Formulation: With patient/family Time For Goal Achievement: 06/25/22 Potential to Achieve Goals: Good    Frequency Min 3X/week     Co-evaluation               AM-PAC PT "6 Clicks" Mobility  Outcome Measure Help needed turning from your back to your side while in a flat bed without using bedrails?: A Little Help needed moving from lying on your back to sitting on the side of a flat bed without using bedrails?: A Little Help needed moving to and from a bed to a chair (including a wheelchair)?: A Little Help needed standing up from a chair using your arms (e.g., wheelchair or bedside  chair)?: A Little Help needed to walk in hospital room?: A Little Help needed climbing 3-5 steps with a  railing? : A Lot 6 Click Score: 17    End of Session Equipment Utilized During Treatment: Gait belt Activity Tolerance: Patient tolerated treatment well Patient left: in chair;with call bell/phone within reach;with chair alarm set Nurse Communication: Mobility status PT Visit Diagnosis: Unsteadiness on feet (R26.81);Other abnormalities of gait and mobility (R26.89);Muscle weakness (generalized) (M62.81);History of falling (Z91.81);Difficulty in walking, not elsewhere classified (R26.2)    Time: 0156-1537 PT Time Calculation (min) (ACUTE ONLY): 24 min   Charges:   PT Evaluation $PT Eval Low Complexity: 1 Low PT Treatments $Gait Training: 8-22 mins        Candie Mile, PT, DPT Physical Therapist Acute Rehabilitation Services [place] 06/12/2022, 12:25 PM

## 2022-06-12 NOTE — Progress Notes (Addendum)
[facility] PROGRESS NOTE   Paisely Brick Matar HDQ:[id number] DOB: 1933/10/19 DOA: 06/10/2022  PCP: Adaline Sill, NP  Brief History/Interval Summary:  89 y.o. female with history of seizures, hypertension, hyperlipidemia, diabetes and left MCA stroke presenting after she had an unwitnessed fall with acute onset altered mental status and worsening right-sided weakness.  She does have a history of seizures and may have had a seizure prior to the unwitnessed fall, as she had her normal seizure prodrome of right-sided pain and sensory deficit before falling.  CT head shows small ICH along left temporal cortex.   Consultants: Neurology  Procedures: Echocardiogram    Subjective/Interval History: Patient denies any headaches.  No shortness of breath.  No nausea vomiting.  Feels better.    Assessment/Plan:  Intracranial hemorrhage Found to have a small intracranial hemorrhage in the left hemisphere.  Seen by neurology.  Was on aspirin and Plavix prior to admission which is currently on hold. Echocardiogram showed EF of 55 to 60% with grade 1 diastolic dysfunction. LDL 30.  HbA1c 7.2. PT and OT evaluation is pending.  Seizure disorder Continue Keppra.  Long-term monitoring did not show any seizure activity.  Essential hypertension Goal blood pressure is less than 119 systolic.  Noted to be on losartan prior to admission. Currently on no medications.  Blood pressure is very well-controlled.  Chronic kidney disease stage IV/hyperkalemia Baseline creatinine around 2.0.  Presented with creatinine of 2.4.  Noted to be 1.98 today.  Avoid nephrotoxic agents. Potassium level noted to be normal today.  Hyperlipidemia Continue statin.  Diabetes mellitus type 2, uncontrolled with hyperglycemia HbA1c 7.2.  Was on glipizide Jardiance and metformin prior to admission. SSI.  Monitor CBGs.  Normocytic anemia No overt bleeding apart from intracranial hemorrhage.  Continue to  monitor closely.  Obesity Estimated body mass index is 30.7 kg/m as calculated from the following:   Height as of this encounter: '5\' 3"'$  (1.6 m).   Weight as of this encounter: 78.6 kg.   DVT Prophylaxis: SCDs Code Status: Full code Family Communication: Discussed with the patient Disposition Plan: Await PT and OT evaluation  Status is: Inpatient Remains inpatient appropriate because: Intracranial hemorrhage    Medications: Scheduled:  Chlorhexidine Gluconate Cloth  6 each Topical Daily   cholecalciferol  1,000 Units Oral Daily   insulin aspart  0-6 Units Subcutaneous TID WC   levETIRAcetam  250 mg Oral BID   levothyroxine  125 mcg Oral QAC breakfast   mouth rinse  15 mL Mouth Rinse 4 times per day   pantoprazole  40 mg Oral Daily   Continuous: ERD:EYCXKGYJEHUDJ **OR** acetaminophen (TYLENOL) oral liquid 160 mg/5 mL **OR** acetaminophen, mouth rinse  Antibiotics: Anti-infectives (From admission, onward)    None       Objective:  Vital Signs  Vitals:   06/12/22 0330 06/12/22 0400 06/12/22 0440 06/12/22 0838  BP: 126/80 111/89 134/70 137/81  Pulse: 80 82 81 77  Resp:   16 17  Temp:   97.6 F (36.4 C) 97.6 F (36.4 C)  TempSrc:   Oral Oral  SpO2: 95% 95% 99% 98%  Weight:      Height:        Intake/Output Summary (Last 24 hours) at 06/12/2022 1034 Last data filed at 06/12/2022 0839 Gross per 24 hour  Intake 965.37 ml  Output 1900 ml  Net -934.63 ml   Filed Weights   06/10/22 0900  Weight: 78.6 kg    General appearance: Awake alert.  In no distress Resp: Clear to auscultation bilaterally.  Normal effort Cardio: S1-S2 is normal regular.  No S3-S4.  No rubs murmurs or bruit GI: Abdomen is soft.  Nontender nondistended.  Bowel sounds are present normal.  No masses organomegaly Extremities: No edema.  Full range of motion of lower extremities. Neurologic: Alert and oriented x3.  No focal neurological deficits.    Lab Results:  Data Reviewed: I have  personally reviewed following labs and reports of the imaging studies  CBC: Recent Labs  Lab 06/10/22 1000 06/11/22 0716 06/12/22 0150  WBC 8.9 6.8 7.4  NEUTROABS 5.8 4.3  --   HGB 12.1  12.6 11.8* 10.9*  HCT 38.1  37.0 36.8 34.0*  MCV 87.8 86.8 86.3  PLT 234 226 469    Basic Metabolic Panel: Recent Labs  Lab 06/10/22 1756 06/11/22 0716 06/11/22 1112 06/11/22 1820 06/12/22 0150  NA 137 137 137 137 138  K 5.9* 5.4* 5.4* 4.8 4.7  CL 109 110 110 108 110  CO2 17* 21* 20* 19* 19*  GLUCOSE 82 118* 111* 164* 115*  BUN 32* 28* 26* 26* 25*  CREATININE 2.20* 2.15* 2.12* 2.04* 1.98*  CALCIUM 8.7* 8.4* 8.6* 8.7* 8.5*    GFR: Estimated Creatinine Clearance: 20.3 mL/min (A) (by C-G formula based on SCr of 1.98 mg/dL (H)).  Liver Function Tests: Recent Labs  Lab 06/10/22 1000 06/10/22 1756  AST 23 28  ALT 14 16  ALKPHOS 77 75  BILITOT 0.7 0.9  PROT 6.3* 6.0*  ALBUMIN 3.4* 3.4*     Coagulation Profile: Recent Labs  Lab 06/10/22 1000  INR 1.0     CBG: Recent Labs  Lab 06/10/22 0952 06/10/22 1720 06/10/22 1945 06/11/22 1815 06/12/22 0843  GLUCAP 273* 81 85 165* 127*     Recent Results (from the past 240 hour(s))  MRSA Next Gen by PCR, Nasal     Status: None   Collection Time: 06/10/22  6:55 PM   Specimen: Nasal Mucosa; Nasal Swab  Result Value Ref Range Status   MRSA by PCR Next Gen NOT DETECTED NOT DETECTED Final    Comment: (NOTE) The GeneXpert MRSA Assay (FDA approved for NASAL specimens only), is one component of a comprehensive MRSA colonization surveillance program. It is not intended to diagnose MRSA infection nor to guide or monitor treatment for MRSA infections. Test performance is not FDA approved in patients less than 67 years old. Performed at [facility], [place] [address] [place], [place] [address]       Radiology Studies: MR BRAIN W WO CONTRAST  Result Date: 06/11/2022 CLINICAL DATA:  History of seizures and remote left  MCA stroke presents with altered mental status and worsening right-sided weakness. EXAM: MRI HEAD WITHOUT AND WITH CONTRAST TECHNIQUE: Multiplanar, multiecho pulse sequences of the brain and surrounding structures were obtained without and with intravenous contrast. CONTRAST:  7.49m GADAVIST GADOBUTROL 1 MMOL/ML IV SOLN COMPARISON:  CT/CTA head and neck 1 day prior FINDINGS: Brain: There is no evidence of acute infarct There is a small focus of SWI signal dropout with associated FLAIR signal abnormality in the left anterior temporal lobe corresponding to the hyperdensity seen on prior CT consistent with a small focus of hemorrhage measuring approximately 4 mm. There is no significant mass effect. There is no associated enhancement. There is no other acute intracranial hemorrhage. Parenchymal volume is stable. The ventricles are stable in size. The remote infarct in the left parietal lobe in the MCA distribution with  associated encephalomalacia, gliosis, and cortical laminar necrosis is unchanged. There is unchanged ex vacuo dilatation of the right lateral ventricle. Additional remote infarcts in the right corona radiata and background advanced chronic small-vessel ischemic change are stable. The hippocampi are unremarkable. The pituitary and suprasellar region are normal. There is no mass lesion or abnormal enhancement. There is no mass effect or midline shift. Vascular: Normal flow voids. Skull and upper cervical spine: Normal marrow signal. Sinuses/Orbits: There is mucosal thickening in the right maxillary sinus. Bilateral lens implants are in place. The globes and orbits are otherwise unremarkable. There are bilateral mastoid effusions, nonspecific and also present on the prior study from 2023. Other: None. IMPRESSION: 1. Small focus of hemorrhage in the left anterior temporal lobe is similar to the prior CT, allowing for differences in modality. No significant mass effect. 2. No other acute intracranial  pathology. Electronically Signed   By: Valetta Mole M.D.   On: 06/11/2022 15:18   Overnight EEG with video  Result Date: 06/11/2022 Lora Havens, MD     06/11/2022 11:29 AM Patient Name: BARBAR BREDE MRN: [id number] Epilepsy Attending: Lora Havens Referring Physician/Provider: Otelia Santee, NP Duration: 06/10/2022 1053 to 06/11/2022 1122 Patient history: 89 year old female with right-sided weakness, altered mental status.  EEG to evaluate for seizure. Level of alertness: Awake, asleep AEDs during EEG study: LEV Technical aspects: This EEG study was done with scalp electrodes positioned according to the 10-20 International system of electrode placement. Electrical activity was reviewed with band pass filter of 1-'70Hz'$ , sensitivity of 7 uV/mm, display speed of 32m/sec with a '60Hz'$  notched filter applied as appropriate. EEG data were recorded continuously and digitally stored.  Video monitoring was available and reviewed as appropriate. Description: The posterior dominant rhythm consists of 9-10 Hz activity of moderate voltage (25-35 uV) seen predominantly in posterior head regions, symmetric and reactive to eye opening and eye closing. Sleep was characterized by vertex waves, sleep spindles (12 to 14 Hz), maximal frontocentral region. EEG showed intermittent 3-'5Hz'$  theta-delta slowing in left frontotemporal region. Hyperventilation and photic stimulation were not performed.   ABNORMALITY -Intermittent slow, left frontotemporal region. IMPRESSION: This study is suggestive of cortical dysfunction arising from left frontotemporal region likely secondary to underlying structural abnormality/ ICH. No seizures or definite epileptiform discharges were seen throughout the recording. PLora Havens  CT HEAD WO CONTRAST  Result Date: 06/10/2022 CLINICAL DATA:  Stroke, follow up EXAM: CT HEAD WITHOUT CONTRAST TECHNIQUE: Contiguous axial images were obtained from the base of the skull through the vertex  without intravenous contrast. RADIATION DOSE REDUCTION: This exam was performed according to the departmental dose-optimization program which includes automated exposure control, adjustment of the mA and/or kV according to patient size and/or use of iterative reconstruction technique. COMPARISON:  CT head 06/10/2022 FINDINGS: Brain: Patchy and confluent areas of decreased attenuation are noted throughout the deep and periventricular white matter of the cerebral hemispheres bilaterally, compatible with chronic microvascular ischemic disease. Chronic left parietooccipital and right corona radiata infarction. No evidence of large-territorial acute infarction. Persistent slightly less conspicuous a stable in size hyperdensity measuring approximately 4 mm along the left lateral temporal lobe (3:12). No extra-axial acute fluid. Slight enhancing vasculature due to prior CT angiography head neck 06/10/2022. No mass lesion. No extra-axial collection. No mass effect or midline shift. No hydrocephalus. Basilar cisterns are patent. Vascular: No hyperdense vessel. Atherosclerotic calcifications are present within the cavernous internal carotid and vertebral arteries. Skull: No acute fracture or focal lesion.  Sinuses/Orbits: Paranasal sinuses and mastoid air cells are clear. The orbits are unremarkable. Other: None. IMPRESSION: No change with persistent slightly less conspicuous a stable in size hyperdensity measuring approximately 4 mm along the left lateral temporal lobe. Electronically Signed   By: Iven Finn M.D.   On: 06/10/2022 20:18   ECHOCARDIOGRAM COMPLETE  Result Date: 06/10/2022    ECHOCARDIOGRAM REPORT   Patient Name:   PEGGYANN ZWIEFELHOFER Date of Exam: 06/10/2022 Medical Rec #:  [id number]         Height:       63.0 in Accession #:    [id number]        Weight:       173.3 lb Date of Birth:  [birth year]         BSA:          1.819 m Patient Age:    15 years          BP:           107/76 mmHg Patient Gender: F                  HR:           79 bpm. Exam Location:  Inpatient Procedure: 2D Echo, Cardiac Doppler and Color Doppler Indications:    Stroke I63.9  History:        Patient has prior history of Echocardiogram examinations, most                 recent 03/08/2021. Stroke, Signs/Symptoms:Altered Mental Status;                 Risk Factors:Diabetes, Dyslipidemia and Hypertension.  Sonographer:    Greer Pickerel Referring Phys: [id number] Otelia Santee  Sonographer Comments: Technically difficult study due to poor echo windows. Image acquisition challenging due to patient body habitus and Image acquisition challenging due to respiratory motion. IMPRESSIONS  1. Challenging windows. Left ventricular ejection fraction, by estimation, is 55 to 60%. The left ventricle has normal function. The left ventricle has no regional wall motion abnormalities. There is mild left ventricular hypertrophy. Left ventricular diastolic parameters are consistent with Grade I diastolic dysfunction (impaired relaxation).  2. Right ventricular systolic function is normal. The right ventricular size is normal.  3. Left atrial size was mild to moderately dilated.  4. No evidence of mitral valve regurgitation. Moderate mitral annular calcification.  5. The aortic valve was not well visualized. Aortic valve regurgitation is not visualized.  6. The inferior vena cava is normal in size with greater than 50% respiratory variability, suggesting right atrial pressure of 3 mmHg. Comparison(s): No significant change from prior study. FINDINGS  Left Ventricle: Challenging windows. Left ventricular ejection fraction, by estimation, is 55 to 60%. The left ventricle has normal function. The left ventricle has no regional wall motion abnormalities. The left ventricular internal cavity size was normal in size. There is mild left ventricular hypertrophy. Left ventricular diastolic parameters are consistent with Grade I diastolic dysfunction (impaired relaxation). Right  Ventricle: The right ventricular size is normal. Right ventricular systolic function is normal. Left Atrium: Left atrial size was mild to moderately dilated. Right Atrium: Right atrial size was normal in size. Pericardium: There is no evidence of pericardial effusion. Mitral Valve: Moderate mitral annular calcification. No evidence of mitral valve regurgitation. Tricuspid Valve: Tricuspid valve regurgitation is not demonstrated. Aortic Valve: The aortic valve was not well visualized. Aortic valve regurgitation is not visualized. Pulmonic Valve: The pulmonic valve was  not well visualized. Aorta: The aortic root and ascending aorta are structurally normal, with no evidence of dilitation. Venous: The inferior vena cava is normal in size with greater than 50% respiratory variability, suggesting right atrial pressure of 3 mmHg. IAS/Shunts: The interatrial septum was not well visualized.  LEFT VENTRICLE PLAX 2D LVOT diam:     1.50 cm     Diastology LV SV:         39          LV e' medial:    6.68 cm/s LV SV Index:   21          LV E/e' medial:  8.9 LVOT Area:     1.77 cm    LV e' lateral:   5.75 cm/s                            LV E/e' lateral: 10.3  LV Volumes (MOD) LV vol d, MOD A2C: 40.5 ml LV vol d, MOD A4C: 87.8 ml LV vol s, MOD A2C: 29.7 ml LV vol s, MOD A4C: 45.4 ml LV SV MOD A2C:     10.8 ml LV SV MOD A4C:     87.8 ml LV SV MOD BP:      23.4 ml RIGHT VENTRICLE RV S prime:     11.80 cm/s TAPSE (M-mode): 2.2 cm LEFT ATRIUM             Index        RIGHT ATRIUM           Index LA Vol (A2C):   76.6 ml 42.10 ml/m  RA Area:     19.50 cm LA Vol (A4C):   74.2 ml 40.78 ml/m  RA Volume:   53.30 ml  29.30 ml/m LA Biplane Vol: 80.7 ml 44.36 ml/m  AORTIC VALVE LVOT Vmax:   116.50 cm/s LVOT Vmean:  76.800 cm/s LVOT VTI:    0.218 m  AORTA Ao Root diam: 3.20 cm MITRAL VALVE MV Area (PHT): 3.72 cm     SHUNTS MV Decel Time: 204 msec     Systemic VTI:  0.22 m MV E velocity: 59.20 cm/s   Systemic Diam: 1.50 cm MV A velocity:  112.00 cm/s MV E/A ratio:  0.53 Landscape architect signed by Phineas Inches Signature Date/Time: 06/10/2022/2:55:13 PM    Final        LOS: 2 days   [facility] Pager on [email or web address]  06/12/2022, 10:34 AM

## 2022-06-13 ENCOUNTER — Inpatient Hospital Stay (HOSPITAL_COMMUNITY): Payer: Medicare Other

## 2022-06-13 DIAGNOSIS — I611 Nontraumatic intracerebral hemorrhage in hemisphere, cortical: Secondary | ICD-10-CM | POA: Diagnosis not present

## 2022-06-13 DIAGNOSIS — Z87898 Personal history of other specified conditions: Secondary | ICD-10-CM | POA: Diagnosis not present

## 2022-06-13 DIAGNOSIS — Z8673 Personal history of transient ischemic attack (TIA), and cerebral infarction without residual deficits: Secondary | ICD-10-CM | POA: Diagnosis not present

## 2022-06-13 DIAGNOSIS — R569 Unspecified convulsions: Secondary | ICD-10-CM | POA: Diagnosis not present

## 2022-06-13 LAB — BASIC METABOLIC PANEL
Anion gap: 9 (ref 5–15)
BUN: 31 mg/dL — ABNORMAL HIGH (ref 8–23)
CO2: 22 mmol/L (ref 22–32)
Calcium: 9.1 mg/dL (ref 8.9–10.3)
Chloride: 106 mmol/L (ref 98–111)
Creatinine, Ser: 2.12 mg/dL — ABNORMAL HIGH (ref 0.44–1.00)
GFR, Estimated: 22 mL/min — ABNORMAL LOW (ref >60)
Glucose, Bld: 153 mg/dL — ABNORMAL HIGH (ref 70–99)
Potassium: 4.4 mmol/L (ref 3.5–5.1)
Sodium: 137 mmol/L (ref 135–145)

## 2022-06-13 LAB — CBC
HCT: 33.3 % — ABNORMAL LOW (ref 36.0–46.0)
Hemoglobin: 11 g/dL — ABNORMAL LOW (ref 12.0–15.0)
MCH: 28.2 pg (ref 26.0–34.0)
MCHC: 33 g/dL (ref 30.0–36.0)
MCV: 85.4 fL (ref 80.0–100.0)
Platelets: 208 10*3/uL (ref 150–400)
RBC: 3.9 MIL/uL (ref 3.87–5.11)
RDW: 14.1 % (ref 11.5–15.5)
WBC: 6.7 10*3/uL (ref 4.0–10.5)
nRBC: 0 % (ref 0.0–0.2)

## 2022-06-13 LAB — GLUCOSE, CAPILLARY
Glucose-Capillary: 174 mg/dL — ABNORMAL HIGH (ref 70–99)
Glucose-Capillary: 179 mg/dL — ABNORMAL HIGH (ref 70–99)
Glucose-Capillary: 193 mg/dL — ABNORMAL HIGH (ref 70–99)
Glucose-Capillary: 145 mg/dL — ABNORMAL HIGH (ref 70–99)

## 2022-06-13 MED ORDER — ASPIRIN 81 MG PO TBEC
81.0000 mg | DELAYED_RELEASE_TABLET | Freq: Every day | ORAL | Status: DC
  Administered 2022-06-13 – 2022-06-18 (×6): 81 mg via ORAL
  Filled 2022-06-13 (×6): qty 1

## 2022-06-13 NOTE — Progress Notes (Addendum)
STROKE TEAM PROGRESS NOTE   INTERVAL HISTORY Patient is seen in her room with her son at the bedside.  She has been hemodynamically stable overnight, and her neurological exam is unchanged.  Head CT this morning reveals stable ICH, so we will start on aspirin 81 mg daily.  Patient is awaiting placement at SNF for rehab.  Vitals:   06/12/22 2325 06/13/22 0342 06/13/22 0500 06/13/22 0843  BP: 121/68 112/84  130/82  Pulse: 76 78  89  Resp: '14 16  17  '$ Temp: 98.2 F (36.8 C) 97.9 F (36.6 C)  97.6 F (36.4 C)  TempSrc: Oral Oral  Oral  SpO2: 97% 96%  98%  Weight:   77.4 kg   Height:       CBC:  Recent Labs  Lab 06/10/22 1000 06/11/22 0716 06/12/22 0150 06/13/22 0348  WBC 8.9 6.8 7.4 6.7  NEUTROABS 5.8 4.3  --   --   HGB 12.1  12.6 11.8* 10.9* 11.0*  HCT 38.1  37.0 36.8 34.0* 33.3*  MCV 87.8 86.8 86.3 85.4  PLT 234 226 219 573    Basic Metabolic Panel:  Recent Labs  Lab 06/12/22 0150 06/13/22 0348  NA 138 137  K 4.7 4.4  CL 110 106  CO2 19* 22  GLUCOSE 115* 153*  BUN 25* 31*  CREATININE 1.98* 2.12*  CALCIUM 8.5* 9.1    Lipid Panel: No results for input(s): "CHOL", "TRIG", "HDL", "CHOLHDL", "VLDL", "LDLCALC" in the last 168 hours. HgbA1c: No results for input(s): "HGBA1C" in the last 168 hours. Urine Drug Screen: No results for input(s): "LABOPIA", "COCAINSCRNUR", "LABBENZ", "AMPHETMU", "THCU", "LABBARB" in the last 168 hours.  Alcohol Level  Recent Labs  Lab 06/10/22 1030  ETH <10     IMAGING past 24 hours CT HEAD WO CONTRAST (5MM)  Result Date: 06/13/2022 CLINICAL DATA:  Hemorrhagic stroke follow-up EXAM: CT HEAD WITHOUT CONTRAST TECHNIQUE: Contiguous axial images were obtained from the base of the skull through the vertex without intravenous contrast. RADIATION DOSE REDUCTION: This exam was performed according to the departmental dose-optimization program which includes automated exposure control, adjustment of the mA and/or kV according to patient size  and/or use of iterative reconstruction technique. COMPARISON:  Brain MRI from 2 days ago FINDINGS: Brain: No change in the 4 mm cortical high-density along the superficial left temporal cortex. 4 mm thick subdural collection along the left cerebral convexity at the vertex, low-density. This was not seen on prior studies, see coronal reformats. Left cortical and white matter infarct centered at the parietal lobe. Extensive chronic small vessel ischemia in the cerebral white matter. Chronic perforator infarct at the right basal ganglia. Generalized cerebral volume loss that is mild for age Vascular: No hyperdense vessel or unexpected calcification. Skull: Normal. Negative for fracture or focal lesion. Sinuses/Orbits: No acute finding. IMPRESSION: A small hygroma has developed along the left cerebral convexity measuring up to 4 mm in thickness. No change in the punctate hemorrhage at the left temporal lobe. Electronically Signed   By: Jorje Guild M.D.   On: 06/13/2022 07:12    PHYSICAL EXAM General: Alert, elderly Caucasian lady in no acute distress Respiratory: Regular, unlabored respirations  NEURO:  Mental Status: AA&Ox3, able to follow one-step commands but has difficulty with two-step commands Speech/Language: speech is with some word finding difficulties  Cranial Nerves:  II: PERRL.  III, IV, VI: EOMI. Eyelids elevate symmetrically.  V: Sensation is intact to light touch and symmetrical to face.  VII: Smile is  symmetrical.  VIII: hearing intact to voice. IX, X: Phonation is normal.  QH:[id number] shrug 5/5. XII: tongue is midline without fasciculations. Motor: 5 out of 5 strength to left upper extremity, 5 out of 5 strength to right upper extremity, 5 out of 5 strength to bilateral lower extremities Tone: is normal and bulk is normal Sensation- Intact to light touch bilaterally.  Coordination: No drift.  Gait- deferred   ASSESSMENT/PLAN Lisa Sutton is a 89 y.o. female with  history of Sutton, Lisa Sutton, Lisa Sutton, Lisa and left MCA stroke presenting after she had an unwitnessed fall with acute onset altered mental status and worsening right-sided weakness.  She does have a history of Sutton and may have had a seizure prior to the unwitnessed fall, as she had her normal seizure prodrome of right-sided pain and sensory deficit before falling.  CT head shows small ICH along left temporal cortex.  ICH score 0  ICH: Small ICH along left temporal cortex etiology indeterminate likely micro vascular disease from Lisa Sutton versus amyloid Code Stroke CT head 4 mm ICH along left temporal cortex CTA head & neck no LVO, severe stenosis in left PCA P1 segment, moderate/severe stenosis in right PCA P2 segment and moderate/severe stenosis in left MCA M2 segment MRI no acute infarct noted Follow-up CT 1/21 revealed stable ICH and hygroma along left cerebral convexity 2D Echo EF 55 to 60%  LDL 30 HgbA1c 7.2 Loop recorder reports no A-fib VTE prophylaxis -SCDs aspirin 81 mg daily and clopidogrel 75 mg daily prior to admission, now on aspirin 81 mg daily  Therapy recommendations: SNF Disposition: Pending  History of stroke and seizure 02/2021 admitted for left MCA infarct with left M2 occlusion.  CTP 8/54.  Status post IR with TICI[facility] and interventional radiologist concerning for ICAD given bilateral M2 stenosis.  MRI postprocedure showed continued left M2 occlusion with bilateral M2 stenosis.  EF 60 to 65% no DVT.  LDL 123, A1c 6.2.  Discharged on DAPT for 3 months. 10/2021 admitted for right leg/arm/face twitching.  Status post Ativan and Keppra.  MRI showed left frontal parietal 2 punctate infarcts.  EEG showed left temporal epileptogenicity with spikes.  Discharged on DAPT and statin and Keppra. Loop recorder placed 03/2022 so far no A-fib.  Seizure  Patient has history of Sutton and had typical seizure prodrome prior to unwitnessed fall before admission  yesterday Overnight EEG shows cortical dysfunction arising from left frontotemporal region but no seizure activity Continue home Keppra 250 mg every 12 hours  Lisa Sutton Home meds: Losartan 100 mg daily Stable Keep systolic blood pressure less than 160 Long-term BP goal normotensive  Lisa Sutton Home meds: Rosuvastatin 40 mg daily LDL 30, goal < 70 Resume statin at discharge  Lisa type II Uncontrolled Home meds: Glipizide 5 mg daily, Jardiance 10 mg daily, metformin 500 mg daily HgbA1c 7.2, goal < 7.0 CBGs SSI  Other Stroke Risk Factors Advanced Age >/= 64  Obesity, Body mass index is 30.23 kg/m., BMI >/= 30 associated with increased stroke risk, recommend weight loss, diet and exercise as appropriate  Hx stroke  Other Active Problems Hyperkalemia-K at 5.4 on 1/19, EKG within normal limits- potassium 4.7 the following day after [facility] day # [place] , MSN, AGACNP-BC [facility] See Amion for schedule and pager information 06/13/2022 12:17 PM   ATTENDING NOTE: I reviewed above note and agree with the assessment and plan. Pt was seen and examined.   Son at the  bedside. Pt lying in bed, no acute event overnight, neuro stable. Repeat CT showed stable tiny left temporal ICH. Will resume ASA 81. Continue statin and keppra. PT/OT recommend SNF. Pending placement.  For detailed assessment and plan, please refer to above/below as I have made changes wherever appropriate.   Neurology will sign off. Please call with questions. Pt will follow up with Dr. Tish Frederickson at [facility] in about 4 weeks. Thanks for the consult.   Rosalin Hawking, MD PhD Stroke Neurology 06/13/2022 6:41 PM    To contact Stroke Continuity provider, please refer to [email or web address] After hours, contact General Neurology

## 2022-06-13 NOTE — Plan of Care (Signed)
  Problem: Education: Goal: Knowledge of disease or condition will improve Outcome: Progressing Goal: Knowledge of secondary prevention will improve (MUST DOCUMENT ALL) Outcome: Progressing   Problem: Self-Care: Goal: Ability to participate in self-care as condition permits will improve Outcome: Progressing   Problem: Education: Goal: Knowledge of General Education information will improve Description: Including pain rating scale, medication(s)/side effects and non-pharmacologic comfort measures Outcome: Progressing   Problem: Clinical Measurements: Goal: Will remain free from infection Outcome: Progressing   Problem: Activity: Goal: Risk for activity intolerance will decrease Outcome: Progressing   Problem: Nutrition: Goal: Adequate nutrition will be maintained Outcome: Progressing

## 2022-06-13 NOTE — Progress Notes (Signed)
Pt aware of recommendation for SNF and reports agreeable. Will begin SNF search and f/u with offers as available. [facility] auth request submitted (ref # [id number]), will need to update them with facility choice when known.   Wandra Feinstein, MSW, LCSW [phone] (coverage)

## 2022-06-13 NOTE — Progress Notes (Signed)
[facility] PROGRESS NOTE   Sabra Sessler Berrong WUG:[id number] DOB: Feb 26, 1934 DOA: 06/10/2022  PCP: Adaline Sill, NP  Brief History/Interval Summary:  89 y.o. female with history of seizures, hypertension, hyperlipidemia, diabetes and left MCA stroke presenting after she had an unwitnessed fall with acute onset altered mental status and worsening right-sided weakness.  She does have a history of seizures and may have had a seizure prior to the unwitnessed fall, as she had her normal seizure prodrome of right-sided pain and sensory deficit before falling.  CT head shows small ICH along left temporal cortex.   Consultants: Neurology  Procedures: Echocardiogram    Subjective/Interval History: Patient denies any complaints.  Feels well.  No headaches.  Looking forward to going to rehab.     Assessment/Plan:  Intracranial hemorrhage Found to have a small intracranial hemorrhage in the left hemisphere.  Seen by neurology.  Was on aspirin and Plavix prior to admission both of which were placed on hold. Echocardiogram showed EF of 55 to 60% with grade 1 diastolic dysfunction. LDL 30.  HbA1c 7.2. CT head repeated by neurology today.  Thought to be stable.  Aspirin to be resumed per neurology. Seen by physical therapy.  Skilled nursing facility is recommended for short-term rehab.  TOC consulted.   Seizure disorder Continue Keppra.  Long-term monitoring did not show any seizure activity. Stable from a neurological standpoint.  Essential hypertension Goal blood pressure is less than 888 systolic.  Noted to be on losartan prior to admission. Currently on no medications.  Blood pressure is very well-controlled.  Chronic kidney disease stage IV/hyperkalemia Baseline creatinine around 2.0.  Presented with creatinine of 2.4.  Renal function has been close to baseline.  Urine output.  Hyperlipidemia Continue statin.  Diabetes mellitus type 2, uncontrolled with  hyperglycemia HbA1c 7.2.  Was on glipizide Jardiance and metformin prior to admission. Currently on SSI.  Monitor CBGs.  Normocytic anemia No overt bleeding apart from intracranial hemorrhage.  Hemoglobin has been stable.  Obesity Estimated body mass index is 30.23 kg/m as calculated from the following:   Height as of this encounter: '5\' 3"'$  (1.6 m).   Weight as of this encounter: 77.4 kg.   DVT Prophylaxis: SCDs Code Status: Full code Family Communication: Discussed with the patient Disposition Plan: SNF  Status is: Inpatient Remains inpatient appropriate because: Intracranial hemorrhage    Medications: Scheduled:  aspirin EC  81 mg Oral Daily   Chlorhexidine Gluconate Cloth  6 each Topical Daily   cholecalciferol  1,000 Units Oral Daily   insulin aspart  0-6 Units Subcutaneous TID WC   levETIRAcetam  250 mg Oral BID   levothyroxine  125 mcg Oral QAC breakfast   mouth rinse  15 mL Mouth Rinse 4 times per day   pantoprazole  40 mg Oral Daily   rosuvastatin  20 mg Oral QHS   Continuous: KCM:[id number] **OR** acetaminophen (TYLENOL) oral liquid 160 mg/5 mL **OR** acetaminophen, mouth rinse  Antibiotics: Anti-infectives (From admission, onward)    None       Objective:  Vital Signs  Vitals:   06/12/22 2325 06/13/22 0342 06/13/22 0500 06/13/22 0843  BP: 121/68 112/84  130/82  Pulse: 76 78  89  Resp: '14 16  17  '$ Temp: 98.2 F (36.8 C) 97.9 F (36.6 C)  97.6 F (36.4 C)  TempSrc: Oral Oral  Oral  SpO2: 97% 96%  98%  Weight:   77.4 kg   Height:  Intake/Output Summary (Last 24 hours) at 06/13/2022 0952 Last data filed at 06/13/2022 0841 Gross per 24 hour  Intake 534 ml  Output 1450 ml  Net -916 ml    Filed Weights   06/10/22 0900 06/13/22 0500  Weight: 78.6 kg 77.4 kg   General appearance: Awake alert.  In no distress Resp: Clear to auscultation bilaterally.  Normal effort Cardio: S1-S2 is normal regular.  No S3-S4.  No rubs murmurs or  bruit GI: Abdomen is soft.  Nontender nondistended.  Bowel sounds are present normal.  No masses organomegaly Extremities: No edema.  Full range of motion of lower extremities.   Lab Results:  Data Reviewed: I have personally reviewed following labs and reports of the imaging studies  CBC: Recent Labs  Lab 06/10/22 1000 06/11/22 0716 06/12/22 0150 06/13/22 0348  WBC 8.9 6.8 7.4 6.7  NEUTROABS 5.8 4.3  --   --   HGB 12.1  12.6 11.8* 10.9* 11.0*  HCT 38.1  37.0 36.8 34.0* 33.3*  MCV 87.8 86.8 86.3 85.4  PLT 234 226 219 208     Basic Metabolic Panel: Recent Labs  Lab 06/11/22 0716 06/11/22 1112 06/11/22 1820 06/12/22 0150 06/13/22 0348  NA 137 137 137 138 137  K 5.4* 5.4* 4.8 4.7 4.4  CL 110 110 108 110 106  CO2 21* 20* 19* 19* 22  GLUCOSE 118* 111* 164* 115* 153*  BUN 28* 26* 26* 25* 31*  CREATININE 2.15* 2.12* 2.04* 1.98* 2.12*  CALCIUM 8.4* 8.6* 8.7* 8.5* 9.1     GFR: Estimated Creatinine Clearance: 18.8 mL/min (A) (by C-G formula based on SCr of 2.12 mg/dL (H)).  Liver Function Tests: Recent Labs  Lab 06/10/22 1000 06/10/22 1756  AST 23 28  ALT 14 16  ALKPHOS 77 75  BILITOT 0.7 0.9  PROT 6.3* 6.0*  ALBUMIN 3.4* 3.4*      Coagulation Profile: Recent Labs  Lab 06/10/22 1000  INR 1.0      CBG: Recent Labs  Lab 06/10/22 1945 06/11/22 1815 06/12/22 0843 06/12/22 1257 06/13/22 0625  GLUCAP 85 165* 127* 210* 174*      Recent Results (from the past 240 hour(s))  MRSA Next Gen by PCR, Nasal     Status: None   Collection Time: 06/10/22  6:55 PM   Specimen: Nasal Mucosa; Nasal Swab  Result Value Ref Range Status   MRSA by PCR Next Gen NOT DETECTED NOT DETECTED Final    Comment: (NOTE) The GeneXpert MRSA Assay (FDA approved for NASAL specimens only), is one component of a comprehensive MRSA colonization surveillance program. It is not intended to diagnose MRSA infection nor to guide or monitor treatment for MRSA infections. Test  performance is not FDA approved in patients less than 41 years old. Performed at [facility], [place] [address] [place], [place] [address]       Radiology Studies: CT HEAD WO CONTRAST (5MM)  Result Date: 06/13/2022 CLINICAL DATA:  Hemorrhagic stroke follow-up EXAM: CT HEAD WITHOUT CONTRAST TECHNIQUE: Contiguous axial images were obtained from the base of the skull through the vertex without intravenous contrast. RADIATION DOSE REDUCTION: This exam was performed according to the departmental dose-optimization program which includes automated exposure control, adjustment of the mA and/or kV according to patient size and/or use of iterative reconstruction technique. COMPARISON:  Brain MRI from 2 days ago FINDINGS: Brain: No change in the 4 mm cortical high-density along the superficial left temporal cortex. 4 mm thick subdural collection along the  left cerebral convexity at the vertex, low-density. This was not seen on prior studies, see coronal reformats. Left cortical and white matter infarct centered at the parietal lobe. Extensive chronic small vessel ischemia in the cerebral white matter. Chronic perforator infarct at the right basal ganglia. Generalized cerebral volume loss that is mild for age Vascular: No hyperdense vessel or unexpected calcification. Skull: Normal. Negative for fracture or focal lesion. Sinuses/Orbits: No acute finding. IMPRESSION: A small hygroma has developed along the left cerebral convexity measuring up to 4 mm in thickness. No change in the punctate hemorrhage at the left temporal lobe. Electronically Signed   By: Jorje Guild M.D.   On: 06/13/2022 07:12   MR BRAIN W WO CONTRAST  Result Date: 06/11/2022 CLINICAL DATA:  History of seizures and remote left MCA stroke presents with altered mental status and worsening right-sided weakness. EXAM: MRI HEAD WITHOUT AND WITH CONTRAST TECHNIQUE: Multiplanar, multiecho pulse sequences of the brain and surrounding structures  were obtained without and with intravenous contrast. CONTRAST:  7.55m GADAVIST GADOBUTROL 1 MMOL/ML IV SOLN COMPARISON:  CT/CTA head and neck 1 day prior FINDINGS: Brain: There is no evidence of acute infarct There is a small focus of SWI signal dropout with associated FLAIR signal abnormality in the left anterior temporal lobe corresponding to the hyperdensity seen on prior CT consistent with a small focus of hemorrhage measuring approximately 4 mm. There is no significant mass effect. There is no associated enhancement. There is no other acute intracranial hemorrhage. Parenchymal volume is stable. The ventricles are stable in size. The remote infarct in the left parietal lobe in the MCA distribution with associated encephalomalacia, gliosis, and cortical laminar necrosis is unchanged. There is unchanged ex vacuo dilatation of the right lateral ventricle. Additional remote infarcts in the right corona radiata and background advanced chronic small-vessel ischemic change are stable. The hippocampi are unremarkable. The pituitary and suprasellar region are normal. There is no mass lesion or abnormal enhancement. There is no mass effect or midline shift. Vascular: Normal flow voids. Skull and upper cervical spine: Normal marrow signal. Sinuses/Orbits: There is mucosal thickening in the right maxillary sinus. Bilateral lens implants are in place. The globes and orbits are otherwise unremarkable. There are bilateral mastoid effusions, nonspecific and also present on the prior study from 2023. Other: None. IMPRESSION: 1. Small focus of hemorrhage in the left anterior temporal lobe is similar to the prior CT, allowing for differences in modality. No significant mass effect. 2. No other acute intracranial pathology. Electronically Signed   By: PValetta MoleM.D.   On: 06/11/2022 15:18       LOS: 3 days   [facility] Pager on [email or web address]  06/13/2022, 9:52 AM

## 2022-06-14 ENCOUNTER — Encounter (HOSPITAL_COMMUNITY): Payer: Self-pay | Admitting: Neurology

## 2022-06-14 DIAGNOSIS — I611 Nontraumatic intracerebral hemorrhage in hemisphere, cortical: Secondary | ICD-10-CM | POA: Diagnosis not present

## 2022-06-14 LAB — GLUCOSE, CAPILLARY
Glucose-Capillary: 163 mg/dL — ABNORMAL HIGH (ref 70–99)
Glucose-Capillary: 192 mg/dL — ABNORMAL HIGH (ref 70–99)
Glucose-Capillary: 124 mg/dL — ABNORMAL HIGH (ref 70–99)
Glucose-Capillary: 148 mg/dL — ABNORMAL HIGH (ref 70–99)

## 2022-06-14 MED ORDER — VALTOCO 5 MG DOSE 5 MG/0.1ML NA LIQD: NASAL | 0 refills | Status: DC

## 2022-06-14 NOTE — Care Management Important Message (Signed)
Important Message  Patient Details  Name: Lisa Sutton MRN: [id number] Date of Birth: 1933-06-24   Medicare Important Message Given:  Yes     Dreon Pineda [address] 06/14/2022, 2:57 PM

## 2022-06-14 NOTE — Discharge Summary (Signed)
[facility]  Physician Discharge Summary   Patient ID: Lisa Sutton MRN: [id number] DOB/AGE: [birth year] 87 y.o.  Admit date: 06/10/2022 Discharge date:   06/14/2022   PCP: Adaline Sill, NP  DISCHARGE DIAGNOSES:    ICH (intracerebral hemorrhage) ([place])   Seizure ([place])   RECOMMENDATIONS FOR OUTPATIENT FOLLOW UP: Check CBC and basic metabolic panel in 1 week Ambulatory referral sent to neurology for follow-up  Home Health: Going to SNF Equipment/Devices: None  CODE STATUS: Full code  DISCHARGE CONDITION: fair  Diet recommendation: Heart healthy  INITIAL HISTORY: 89 y.o. female with history of seizures, hypertension, hyperlipidemia, diabetes and left MCA stroke presenting after she had an unwitnessed fall with acute onset altered mental status and worsening right-sided weakness.  She does have a history of seizures and may have had a seizure prior to the unwitnessed fall, as she had her normal seizure prodrome of right-sided pain and sensory deficit before falling.  CT head shows small ICH along left temporal cortex.    Consultants: Neurology   Procedures: Echocardiogram   HOSPITAL COURSE:   Intracranial hemorrhage Found to have a small intracranial hemorrhage in the left hemisphere.  Seen by neurology.  Was on aspirin and Plavix prior to admission both of which were placed on hold. Echocardiogram showed EF of 55 to 60% with grade 1 diastolic dysfunction. LDL 30.  HbA1c 7.2. CT head repeated by neurology.  Thought to be stable.  Aspirin resumed per neurology. Seen by physical therapy.  Skilled nursing facility is recommended for short-term rehab.    Seizure disorder Continue Keppra.  Long-term monitoring did not show any seizure activity. Stable from a neurological standpoint.   Essential hypertension Goal blood pressure is less than 268 systolic.  May resume amlodipine at discharge.  Will continue to hold ARB due to CKD.   Chronic kidney disease  stage IV/hyperkalemia Baseline creatinine around 2.0.  Presented with creatinine of 2.4.  Renal function has been close to baseline.  Consider resuming ARB in the next 2 to 3 weeks if renal function remains stable.   Hyperlipidemia Continue statin.   Diabetes mellitus type 2, uncontrolled with hyperglycemia HbA1c 7.2.  May resume home medications   Normocytic anemia No overt bleeding apart from intracranial hemorrhage.  Hemoglobin has been stable.   Obesity Estimated body mass index is 30.23 kg/m as calculated from the following:   Height as of this encounter: '5\' 3"'$  (1.6 m).   Weight as of this encounter: 77.4 kg.  Patient is stable.  Okay for discharge to SNF when bed is available.  PERTINENT LABS:  The results of significant diagnostics from this hospitalization (including imaging, microbiology, ancillary and laboratory) are listed below for reference.    Microbiology: Recent Results (from the past 240 hour(s))  MRSA Next Gen by PCR, Nasal     Status: None   Collection Time: 06/10/22  6:55 PM   Specimen: Nasal Mucosa; Nasal Swab  Result Value Ref Range Status   MRSA by PCR Next Gen NOT DETECTED NOT DETECTED Final    Comment: (NOTE) The GeneXpert MRSA Assay (FDA approved for NASAL specimens only), is one component of a comprehensive MRSA colonization surveillance program. It is not intended to diagnose MRSA infection nor to guide or monitor treatment for MRSA infections. Test performance is not FDA approved in patients less than 42 years old. Performed at [facility], [place] [address] [place], [place] [address]      Labs:   Basic Metabolic Panel:  Recent Labs  Lab 06/11/22 0716 06/11/22 1112 06/11/22 1820 06/12/22 0150 06/13/22 0348  NA 137 137 137 138 137  K 5.4* 5.4* 4.8 4.7 4.4  CL 110 110 108 110 106  CO2 21* 20* 19* 19* 22  GLUCOSE 118* 111* 164* 115* 153*  BUN 28* 26* 26* 25* 31*  CREATININE 2.15* 2.12* 2.04* 1.98* 2.12*  CALCIUM 8.4* 8.6* 8.7*  8.5* 9.1   Liver Function Tests: Recent Labs  Lab 06/10/22 1000 06/10/22 1756  AST 23 28  ALT 14 16  ALKPHOS 77 75  BILITOT 0.7 0.9  PROT 6.3* 6.0*  ALBUMIN 3.4* 3.4*    CBC: Recent Labs  Lab 06/10/22 1000 06/11/22 0716 06/12/22 0150 06/13/22 0348  WBC 8.9 6.8 7.4 6.7  NEUTROABS 5.8 4.3  --   --   HGB 12.1  12.6 11.8* 10.9* 11.0*  HCT 38.1  37.0 36.8 34.0* 33.3*  MCV 87.8 86.8 86.3 85.4  PLT 234 226 219 208     CBG: Recent Labs  Lab 06/13/22 0625 06/13/22 1146 06/13/22 1740 06/13/22 2055 06/14/22 0618  GLUCAP 174* 179* 193* 145* 163*     IMAGING STUDIES CT HEAD WO CONTRAST (5MM)  Result Date: 06/13/2022 CLINICAL DATA:  Hemorrhagic stroke follow-up EXAM: CT HEAD WITHOUT CONTRAST TECHNIQUE: Contiguous axial images were obtained from the base of the skull through the vertex without intravenous contrast. RADIATION DOSE REDUCTION: This exam was performed according to the departmental dose-optimization program which includes automated exposure control, adjustment of the mA and/or kV according to patient size and/or use of iterative reconstruction technique. COMPARISON:  Brain MRI from 2 days ago FINDINGS: Brain: No change in the 4 mm cortical high-density along the superficial left temporal cortex. 4 mm thick subdural collection along the left cerebral convexity at the vertex, low-density. This was not seen on prior studies, see coronal reformats. Left cortical and white matter infarct centered at the parietal lobe. Extensive chronic small vessel ischemia in the cerebral white matter. Chronic perforator infarct at the right basal ganglia. Generalized cerebral volume loss that is mild for age Vascular: No hyperdense vessel or unexpected calcification. Skull: Normal. Negative for fracture or focal lesion. Sinuses/Orbits: No acute finding. IMPRESSION: A small hygroma has developed along the left cerebral convexity measuring up to 4 mm in thickness. No change in the punctate  hemorrhage at the left temporal lobe. Electronically Signed   By: Jorje Guild M.D.   On: 06/13/2022 07:12   MR BRAIN W WO CONTRAST  Result Date: 06/11/2022 CLINICAL DATA:  History of seizures and remote left MCA stroke presents with altered mental status and worsening right-sided weakness. EXAM: MRI HEAD WITHOUT AND WITH CONTRAST TECHNIQUE: Multiplanar, multiecho pulse sequences of the brain and surrounding structures were obtained without and with intravenous contrast. CONTRAST:  7.77m GADAVIST GADOBUTROL 1 MMOL/ML IV SOLN COMPARISON:  CT/CTA head and neck 1 day prior FINDINGS: Brain: There is no evidence of acute infarct There is a small focus of SWI signal dropout with associated FLAIR signal abnormality in the left anterior temporal lobe corresponding to the hyperdensity seen on prior CT consistent with a small focus of hemorrhage measuring approximately 4 mm. There is no significant mass effect. There is no associated enhancement. There is no other acute intracranial hemorrhage. Parenchymal volume is stable. The ventricles are stable in size. The remote infarct in the left parietal lobe in the MCA distribution with associated encephalomalacia, gliosis, and cortical laminar necrosis is unchanged. There is unchanged ex vacuo dilatation of  the right lateral ventricle. Additional remote infarcts in the right corona radiata and background advanced chronic small-vessel ischemic change are stable. The hippocampi are unremarkable. The pituitary and suprasellar region are normal. There is no mass lesion or abnormal enhancement. There is no mass effect or midline shift. Vascular: Normal flow voids. Skull and upper cervical spine: Normal marrow signal. Sinuses/Orbits: There is mucosal thickening in the right maxillary sinus. Bilateral lens implants are in place. The globes and orbits are otherwise unremarkable. There are bilateral mastoid effusions, nonspecific and also present on the prior study from 2023. Other:  None. IMPRESSION: 1. Small focus of hemorrhage in the left anterior temporal lobe is similar to the prior CT, allowing for differences in modality. No significant mass effect. 2. No other acute intracranial pathology. Electronically Signed   By: Valetta Mole M.D.   On: 06/11/2022 15:18   Overnight EEG with video  Result Date: 06/11/2022 Lora Havens, MD     06/11/2022 11:29 AM Patient Name: Lisa Sutton MRN: [id number] Epilepsy Attending: Lora Havens Referring Physician/Provider: Otelia Santee, NP Duration: 06/10/2022 1053 to 06/11/2022 1122 Patient history: 89 year old female with right-sided weakness, altered mental status.  EEG to evaluate for seizure. Level of alertness: Awake, asleep AEDs during EEG study: LEV Technical aspects: This EEG study was done with scalp electrodes positioned according to the 10-20 International system of electrode placement. Electrical activity was reviewed with band pass filter of 1-'70Hz'$ , sensitivity of 7 uV/mm, display speed of 22m/sec with a '60Hz'$  notched filter applied as appropriate. EEG data were recorded continuously and digitally stored.  Video monitoring was available and reviewed as appropriate. Description: The posterior dominant rhythm consists of 9-10 Hz activity of moderate voltage (25-35 uV) seen predominantly in posterior head regions, symmetric and reactive to eye opening and eye closing. Sleep was characterized by vertex waves, sleep spindles (12 to 14 Hz), maximal frontocentral region. EEG showed intermittent 3-'5Hz'$  theta-delta slowing in left frontotemporal region. Hyperventilation and photic stimulation were not performed.   ABNORMALITY -Intermittent slow, left frontotemporal region. IMPRESSION: This study is suggestive of cortical dysfunction arising from left frontotemporal region likely secondary to underlying structural abnormality/ ICH. No seizures or definite epileptiform discharges were seen throughout the recording. PLora Havens  CT  HEAD WO CONTRAST  Result Date: 06/10/2022 CLINICAL DATA:  Stroke, follow up EXAM: CT HEAD WITHOUT CONTRAST TECHNIQUE: Contiguous axial images were obtained from the base of the skull through the vertex without intravenous contrast. RADIATION DOSE REDUCTION: This exam was performed according to the departmental dose-optimization program which includes automated exposure control, adjustment of the mA and/or kV according to patient size and/or use of iterative reconstruction technique. COMPARISON:  CT head 06/10/2022 FINDINGS: Brain: Patchy and confluent areas of decreased attenuation are noted throughout the deep and periventricular white matter of the cerebral hemispheres bilaterally, compatible with chronic microvascular ischemic disease. Chronic left parietooccipital and right corona radiata infarction. No evidence of large-territorial acute infarction. Persistent slightly less conspicuous a stable in size hyperdensity measuring approximately 4 mm along the left lateral temporal lobe (3:12). No extra-axial acute fluid. Slight enhancing vasculature due to prior CT angiography head neck 06/10/2022. No mass lesion. No extra-axial collection. No mass effect or midline shift. No hydrocephalus. Basilar cisterns are patent. Vascular: No hyperdense vessel. Atherosclerotic calcifications are present within the cavernous internal carotid and vertebral arteries. Skull: No acute fracture or focal lesion. Sinuses/Orbits: Paranasal sinuses and mastoid air cells are clear. The orbits are unremarkable. Other: None. IMPRESSION:  No change with persistent slightly less conspicuous a stable in size hyperdensity measuring approximately 4 mm along the left lateral temporal lobe. Electronically Signed   By: Iven Finn M.D.   On: 06/10/2022 20:18   ECHOCARDIOGRAM COMPLETE  Result Date: 06/10/2022    ECHOCARDIOGRAM REPORT   Patient Name:   Lisa Sutton Date of Exam: 06/10/2022 Medical Rec #:  [id number]         Height:        63.0 in Accession #:    [id number]        Weight:       173.3 lb Date of Birth:  [birth year]         BSA:          1.819 m Patient Age:    76 years          BP:           107/76 mmHg Patient Gender: F                 HR:           79 bpm. Exam Location:  Inpatient Procedure: 2D Echo, Cardiac Doppler and Color Doppler Indications:    Stroke I63.9  History:        Patient has prior history of Echocardiogram examinations, most                 recent 03/08/2021. Stroke, Signs/Symptoms:Altered Mental Status;                 Risk Factors:Diabetes, Dyslipidemia and Hypertension.  Sonographer:    Greer Pickerel Referring Phys: [id number] Otelia Santee  Sonographer Comments: Technically difficult study due to poor echo windows. Image acquisition challenging due to patient body habitus and Image acquisition challenging due to respiratory motion. IMPRESSIONS  1. Challenging windows. Left ventricular ejection fraction, by estimation, is 55 to 60%. The left ventricle has normal function. The left ventricle has no regional wall motion abnormalities. There is mild left ventricular hypertrophy. Left ventricular diastolic parameters are consistent with Grade I diastolic dysfunction (impaired relaxation).  2. Right ventricular systolic function is normal. The right ventricular size is normal.  3. Left atrial size was mild to moderately dilated.  4. No evidence of mitral valve regurgitation. Moderate mitral annular calcification.  5. The aortic valve was not well visualized. Aortic valve regurgitation is not visualized.  6. The inferior vena cava is normal in size with greater than 50% respiratory variability, suggesting right atrial pressure of 3 mmHg. Comparison(s): No significant change from prior study. FINDINGS  Left Ventricle: Challenging windows. Left ventricular ejection fraction, by estimation, is 55 to 60%. The left ventricle has normal function. The left ventricle has no regional wall motion abnormalities. The left ventricular  internal cavity size was normal in size. There is mild left ventricular hypertrophy. Left ventricular diastolic parameters are consistent with Grade I diastolic dysfunction (impaired relaxation). Right Ventricle: The right ventricular size is normal. Right ventricular systolic function is normal. Left Atrium: Left atrial size was mild to moderately dilated. Right Atrium: Right atrial size was normal in size. Pericardium: There is no evidence of pericardial effusion. Mitral Valve: Moderate mitral annular calcification. No evidence of mitral valve regurgitation. Tricuspid Valve: Tricuspid valve regurgitation is not demonstrated. Aortic Valve: The aortic valve was not well visualized. Aortic valve regurgitation is not visualized. Pulmonic Valve: The pulmonic valve was not well visualized. Aorta: The aortic root and ascending aorta are structurally normal, with no evidence  of dilitation. Venous: The inferior vena cava is normal in size with greater than 50% respiratory variability, suggesting right atrial pressure of 3 mmHg. IAS/Shunts: The interatrial septum was not well visualized.  LEFT VENTRICLE PLAX 2D LVOT diam:     1.50 cm     Diastology LV SV:         39          LV e' medial:    6.68 cm/s LV SV Index:   21          LV E/e' medial:  8.9 LVOT Area:     1.77 cm    LV e' lateral:   5.75 cm/s                            LV E/e' lateral: 10.3  LV Volumes (MOD) LV vol d, MOD A2C: 40.5 ml LV vol d, MOD A4C: 87.8 ml LV vol s, MOD A2C: 29.7 ml LV vol s, MOD A4C: 45.4 ml LV SV MOD A2C:     10.8 ml LV SV MOD A4C:     87.8 ml LV SV MOD BP:      23.4 ml RIGHT VENTRICLE RV S prime:     11.80 cm/s TAPSE (M-mode): 2.2 cm LEFT ATRIUM             Index        RIGHT ATRIUM           Index LA Vol (A2C):   76.6 ml 42.10 ml/m  RA Area:     19.50 cm LA Vol (A4C):   74.2 ml 40.78 ml/m  RA Volume:   53.30 ml  29.30 ml/m LA Biplane Vol: 80.7 ml 44.36 ml/m  AORTIC VALVE LVOT Vmax:   116.50 cm/s LVOT Vmean:  76.800 cm/s LVOT VTI:     0.218 m  AORTA Ao Root diam: 3.20 cm MITRAL VALVE MV Area (PHT): 3.72 cm     SHUNTS MV Decel Time: 204 msec     Systemic VTI:  0.22 m MV E velocity: 59.20 cm/s   Systemic Diam: 1.50 cm MV A velocity: 112.00 cm/s MV E/A ratio:  0.53 Landscape architect signed by Phineas Inches Signature Date/Time: 06/10/2022/2:55:13 PM    Final    CT ANGIO HEAD NECK W WO CM (CODE STROKE)  Result Date: 06/10/2022 CLINICAL DATA:  Provided history: Neuro deficit, acute, stroke suspected. Acute on chronic right-sided weakness, aphasia, unresponsiveness, possible seizure, chronic left MCA stroke. EXAM: CT ANGIOGRAPHY HEAD AND NECK TECHNIQUE: Multidetector CT imaging of the head and neck was performed using the standard protocol during bolus administration of intravenous contrast. Multiplanar CT image reconstructions and MIPs were obtained to evaluate the vascular anatomy. Carotid stenosis measurements (when applicable) are obtained utilizing NASCET criteria, using the distal internal carotid diameter as the denominator. RADIATION DOSE REDUCTION: This exam was performed according to the departmental dose-optimization program which includes automated exposure control, adjustment of the mA and/or kV according to patient size and/or use of iterative reconstruction technique. CONTRAST:  74m OMNIPAQUE IOHEXOL 350 MG/ML SOLN COMPARISON:  Contrast head CT performed earlier the same day 06/10/2022. CT angiogram head/neck 10/28/2021. FINDINGS: CTA NECK FINDINGS Aortic arch: Standard aortic branching. Atherosclerotic plaque within the visualized aortic arch and proximal major branch vessels of the neck., 6 Streak and beam hardening artifact arising from a dense left-sided contrast bolus partially obscures the left subclavian artery. Within this limitation, there is no appreciable  hemodynamically significant innominate or proximal subclavian artery stenosis. Right carotid system: CCA and ICA patent within the neck without stenosis or  significant atherosclerotic disease. Left carotid system: CCA and ICA patent within the neck without stenosis. Mild atherosclerotic plaque about the carotid bifurcation. Vertebral arteries: Vertebral arteries codominant and patent within the neck. Redemonstrated moderate to severe atherosclerotic narrowing at the origin of the left vertebral artery. Skeleton: Cervical spondylosis. Congenital nonunion of the posterior arch of C1. No acute fracture or aggressive osseous lesion. Other neck: No neck mass or cervical lymphadenopathy. Upper chest: No consolidation within the imaged lung apices. Review of the MIP images confirms the above findings CTA HEAD FINDINGS Anterior circulation: The intracranial internal carotid arteries are patent. Atherosclerotic plaque within the intracranial right ICA with no more than mild stenosis. Mild atherosclerotic stenosis within the left cavernous segment. Up to moderate atherosclerotic narrowing within the paraclinoid left ICA. The M1 middle cerebral arteries are patent. Atherosclerotic irregularity of the M2 and more distal MCA vessels, bilaterally. Most notably, there is an unchanged moderate/severe focal stenosis within a mid M2 left MCA vessel (series 11, image 13). The anterior cerebral arteries are patent. Mild atherosclerotic irregularity of both vessels. No intracranial aneurysm is identified. Posterior circulation: The intracranial vertebral arteries are patent. The basilar artery is patent. The posterior cerebral arteries are patent. Atherosclerotic irregularity of both vessels, most notably as follows. New severe stenosis within the left PCA P1 segment. Redemonstrated mild-to-moderate focal stenosis within the left PCA P2 segment. Redemonstrated moderate/severe stenosis within the right PCA P2 segment. Small posterior communicating arteries are present, bilaterally. Venous sinuses: Within the limitations of contrast timing, no convincing thrombus. Anatomic variants: As  described. Review of the MIP images confirms the above findings CTA head impressions #1 and #3 called by telephone at the time of interpretation on 06/10/2022 at 10:35 am to provider [facility] , who verbally acknowledged these results. IMPRESSION: CTA neck: 1. The common carotid and internal carotid arteries are patent within the neck without stenosis. Mild atherosclerotic plaque about the left carotid bifurcation. 2. The vertebral arteries are patent within the neck. Redemonstrated moderate-to-severe atherosclerotic stenosis at the origin of the left vertebral artery. CTA head: 1. No intracranial large vessel occlusion is identified. 2. Intracranial atherosclerotic disease, as outlined and most notably as follows. 3. Severe stenosis within the left PCA P1 segment, new from the prior CTA of 10/28/2021. 4. Redemonstrated moderate/severe stenosis within the right PCA P2 segment. 5. Redemonstrated moderate/severe stenosis within a mid M2 left MCA vessel. Electronically Signed   By: Kellie Simmering D.O.   On: 06/10/2022 10:35   CT HEAD CODE STROKE WO CONTRAST  Result Date: 06/10/2022 CLINICAL DATA:  Code stroke.  Right handed weakness EXAM: CT HEAD WITHOUT CONTRAST TECHNIQUE: Contiguous axial images were obtained from the base of the skull through the vertex without intravenous contrast. RADIATION DOSE REDUCTION: This exam was performed according to the departmental dose-optimization program which includes automated exposure control, adjustment of the mA and/or kV according to patient size and/or use of iterative reconstruction technique. COMPARISON:  10/28/2021 FINDINGS: Brain: New small hyperdensity along the superficial cortex of the lateral left temporal lobe measuring 4 mm. Chronic left parietooccipital cortically based infarct. Chronic perforator infarct at the right corona radiata. No hydrocephalus or masslike finding. Generalized cerebral volume loss and extensive chronic small vessel ischemia. Vascular: No  hyperdense vessel or unexpected calcification. Skull: Normal. Negative for fracture or focal lesion. Sinuses/Orbits: No acute finding. Other: These results were called by  telephone at the time of interpretation on 06/10/2022 at 10:03 am to provider [facility] , who verbally acknowledged these results. IMPRESSION: 1. New 4 mm high-density along the left temporal cortex suggesting acute hemorrhage. No acute infarct noted. 2. Chronic left parieto-occipital and right corona radiata infarcts. Electronically Signed   By: Jorje Guild M.D.   On: 06/10/2022 10:10    DISCHARGE EXAMINATION: Vitals:   06/13/22 2327 06/14/22 0441 06/14/22 0547 06/14/22 0800  BP: 115/65 123/77  (!) 132/95  Pulse: 91 81  89  Resp: '18 16  14  '$ Temp: 97.9 F (36.6 C) 97.9 F (36.6 C)  98.8 F (37.1 C)  TempSrc: Oral Oral  Oral  SpO2: 97% 98%  96%  Weight:   77.4 kg   Height:       General appearance: Awake alert.  In no distress Resp: Clear to auscultation bilaterally.  Normal effort Cardio: S1-S2 is normal regular.  No S3-S4.  No rubs murmurs or bruit GI: Abdomen is soft.  Nontender nondistended.  Bowel sounds are present normal.  No masses organomegaly   DISPOSITION: SNF  Discharge Instructions     Ambulatory referral to Neurology   Complete by: As directed    Follow up with Dr. Leta Baptist at [facility] in 4-6 weeks.  Patient is Dr. Gladstone Lighter patient. Thanks.   Call MD for:  difficulty breathing, headache or visual disturbances   Complete by: As directed    Call MD for:  hives   Complete by: As directed    Call MD for:  persistant dizziness or light-headedness   Complete by: As directed    Call MD for:  persistant nausea and vomiting   Complete by: As directed    Call MD for:  severe uncontrolled pain   Complete by: As directed    Call MD for:  temperature >100.4   Complete by: As directed    Diet - low sodium heart healthy   Complete by: As directed    Discharge instructions   Complete by: As directed     Please review instructions on the discharge summary  You were cared for by a hospitalist during your hospital stay. If you have any questions about your discharge medications or the care you received while you were in the hospital after you are discharged, you can call the unit and asked to speak with the hospitalist on call if the hospitalist that took care of you is not available. Once you are discharged, your primary care physician will handle any further medical issues. Please note that NO REFILLS for any discharge medications will be authorized once you are discharged, as it is imperative that you return to your primary care physician (or establish a relationship with a primary care physician if you do not have one) for your aftercare needs so that they can reassess your need for medications and monitor your lab values. If you do not have a primary care physician, you can call [phone] for a physician referral.   Increase activity slowly   Complete by: As directed           Allergies as of 06/14/2022       Reactions   Statins Other (See Comments)   'discomfort, aching everywhere' Tolerates to livalo    Empagliflozin Other (See Comments)   Other    Allergic to diabetic medication but unsure of name. Went to ED February 2023 (ED note I do not see name of med)    Zetia [ezetimibe]  Other (See Comments)   Leg pain   Zocor [simvastatin] Other (See Comments)   Leg pain        Medication List     STOP taking these medications    clopidogrel 75 MG tablet Commonly known as: Plavix   diphenhydrAMINE 25 MG tablet Commonly known as: BENADRYL   hydrOXYzine 50 MG tablet Commonly known as: ATARAX   losartan 100 MG tablet Commonly known as: COZAAR       TAKE these medications    amLODipine 10 MG tablet Commonly known as: NORVASC Take 10 mg by mouth daily.   aspirin EC 81 MG tablet Take 81 mg by mouth daily. Swallow whole.   cholecalciferol 25 MCG (1000 UNIT)  tablet Commonly known as: VITAMIN D3 Take 1,000 Units by mouth daily.   glipiZIDE 5 MG tablet Commonly known as: GLUCOTROL Take 5 mg by mouth every morning.   Jardiance 10 MG Tabs tablet Generic drug: empagliflozin Take 10 mg by mouth daily.   levETIRAcetam 250 MG tablet Commonly known as: KEPPRA Take 1 tablet (250 mg total) by mouth 2 (two) times daily.   levothyroxine 125 MCG tablet Commonly known as: SYNTHROID Take 1 tablet (125 mcg total) by mouth daily before breakfast.   meclizine 25 MG tablet Commonly known as: ANTIVERT Take 25 mg by mouth 2 (two) times daily as needed for dizziness.   metFORMIN 500 MG 24 hr tablet Commonly known as: GLUCOPHAGE-XR Take 500 mg by mouth daily.   pantoprazole 40 MG tablet Commonly known as: PROTONIX Take 1 tablet (40 mg total) by mouth daily before lunch.   rosuvastatin 40 MG tablet Commonly known as: CRESTOR Take 40 mg by mouth at bedtime.   triamcinolone cream 0.1 % Commonly known as: KENALOG Apply 1 application. topically daily as needed (irritation).   Valtoco 5 MG Dose 5 MG/0.1ML Liqd Generic drug: diazePAM Spray in nose if seizure last more than 5 minutes. Can repeat dose in 4 hours. Do not use more than twice in 24 hours.          Follow-up Information     Penumalli, Earlean Polka, MD. Schedule an appointment as soon as possible for a visit in 1 month(s).   Specialties: Neurology, Radiology Contact information: [address] [place] [address] [phone]                 TOTAL DISCHARGE TIME: 35 minutes  [place]  [facility] Pager on [email or web address]  06/14/2022, 10:37 AM

## 2022-06-14 NOTE — Plan of Care (Signed)
  Problem: Coping: Goal: Will verbalize positive feelings about self Outcome: Progressing   Problem: Nutrition: Goal: Risk of aspiration will decrease Outcome: Progressing Goal: Dietary intake will improve Outcome: Progressing   Problem: Clinical Measurements: Goal: Will remain free from infection Outcome: Progressing Goal: Diagnostic test results will improve Outcome: Progressing Goal: Respiratory complications will improve Outcome: Progressing Goal: Cardiovascular complication will be avoided Outcome: Progressing

## 2022-06-14 NOTE — TOC Initial Note (Signed)
Transition of Care [facility]) - Initial/Assessment Note    Patient Details  Name: Lisa Sutton MRN: [id number] Date of Birth: [birth year]  Transition of [facility]) CM/SW Contact:    Geralynn Ochs, LCSW Phone Number: 06/14/2022, 4:14 PM  Clinical Narrative:        CSW met with patient and patient's son at bedside to discuss recommendation for SNF. Patient has been to [facility] in the past and would like to return. CSW contacted [facility] to send referral, left a voicemail for admissions and awaiting return call. CSW to follow.           Expected Discharge Plan: Skilled Nursing Facility Barriers to Discharge: Continued Medical Work up, Ship broker   Patient Goals and CMS Choice Patient states their goals for this hospitalization and ongoing recovery are:: to get rehab CMS Medicare.gov Compare Post Acute Care list provided to:: Patient Choice offered to / list presented to : Patient [place] ownership interest in [facility].provided to:: Patient    Expected Discharge Plan and Services     Post Acute Care Choice: [place] Living arrangements for the past 2 months: Single Family Home                                      Prior Living Arrangements/Services Living arrangements for the past 2 months: Single Family Home Lives with:: Self, Adult Children Patient language and need for interpreter reviewed:: No Do you feel safe going back to the place where you live?: Yes      Need for Family Participation in Patient Care: No (Comment) Care giver support system in place?: No (comment)   Criminal Activity/Legal Involvement Pertinent to Current Situation/Hospitalization: No - Comment as needed  Activities of Daily Living Home Assistive Devices/Equipment: None (needs cane/walker) ADL Screening (condition at time of admission) Patient's cognitive ability adequate to safely complete daily activities?: Yes Is the  patient deaf or have difficulty hearing?: Yes Does the patient have difficulty seeing, even when wearing glasses/contacts?: No Does the patient have difficulty concentrating, remembering, or making decisions?: No Patient able to express need for assistance with ADLs?: Yes Does the patient have difficulty dressing or bathing?: No Independently performs ADLs?: Yes (appropriate for developmental age) Does the patient have difficulty walking or climbing stairs?: No Weakness of Legs: None Weakness of Arms/Hands: None  Permission Sought/Granted Permission sought to share information with : Facility Sport and exercise psychologist, Family Supports Permission granted to share information with : Yes, Verbal Permission Granted  Share Information with NAME: Joey  Permission granted to share info w AGENCY: SNF  Permission granted to share info w Relationship: Son     Emotional Assessment Appearance:: Appears stated age Attitude/Demeanor/Rapport: Engaged Affect (typically observed): Appropriate Orientation: : Oriented to Self, Oriented to Place, Oriented to  Time, Oriented to Situation Alcohol / Substance Use: Not Applicable Psych Involvement: No (comment)  Admission diagnosis:  ICH (intracerebral hemorrhage) ([place]) [I61.9] Patient Active Problem List   Diagnosis Date Noted   ICH (intracerebral hemorrhage) ([place]) 06/10/2022   Stupor 06/10/2022   Seizure ([place]) 10/28/2021   Hypertensive emergency    Acute ischemic left MCA stroke ([place]) 03/07/2021   Cerebrovascular accident (CVA) ([place])    Encounter for intubation    Pruritus ani 08/05/2020   History of hepatitis B 02/05/2020   Chronic diarrhea 07/24/2019   Proteinuria 07/16/2019   GAD (  generalized anxiety disorder) 03/02/2019   Controlled substance agreement signed 03/02/2019   Hyperlipidemia associated with type 2 diabetes mellitus ([place]) 02/19/2019   CKD stage 3 due to type 2 diabetes mellitus ([place]) 02/19/2019   Primary hypertension 02/19/2019    Elevated alkaline phosphatase level 01/15/2019   Stage 3 chronic kidney disease ([place]) 06/15/2017   History of colonic polyps 05/09/2017   Presbycusis of both ears 06/04/2016   Hypothyroidism due to acquired atrophy of thyroid 04/26/2016   Vitamin D deficiency 04/26/2016   Status post total left knee replacement 09/15/2015   Statin intolerance 09/11/2015   Diabetic neuropathy, type II diabetes mellitus ([place]) 07/30/2015   Abnormal chest x-ray 11/27/2014   Right sided weakness 11/26/2014   Primary insomnia 04/12/2013   GERD (gastroesophageal reflux disease) 04/25/2012   Fatty liver 08/09/2011   Esophageal dysphagia 08/09/2011   Type 2 diabetes mellitus with diabetic nephropathy, without long-term current use of insulin ([place]) 08/09/2011   PCP:  Adaline Sill, NP Pharmacy:   [facility] [place], [address] [place] [address] Phone: [phone] Fax: [place] [address] [place] [address] Phone: [phone] Fax: [phone]  [id number] [place] [id number]: [phone]: [phone]    Social Determinants of Health (SDOH) Social History: SDOH Screenings   Food Insecurity: No Food Insecurity (06/10/2022)  Housing: Low Risk  (06/10/2022)  Transportation Needs: No Transportation Needs (06/10/2022)  Utilities: Not At Risk (06/10/2022)  Alcohol Screen: Low Risk  (09/05/2019)  Depression (PHQ2-9): Low Risk  (07/16/2019)  Financial Resource Strain: Low Risk  (09/05/2019)  Physical Activity: Sufficiently Active (09/05/2019)  Social Connections: Moderately Integrated (09/05/2019)  Stress: No Stress Concern Present (09/05/2019)  Tobacco Use: Low Risk  (06/14/2022)   SDOH Interventions:     Readmission Risk Interventions     No data to display

## 2022-06-14 NOTE — Progress Notes (Signed)
Carelink Summary Report / Loop Recorder

## 2022-06-14 NOTE — Evaluation (Signed)
Speech Language Pathology Evaluation Patient Details Name: Lisa Sutton MRN: [id number] DOB: 25-Oct-1933 Today's Date: 06/14/2022 Time: 4259-5638 SLP Time Calculation (min) (ACUTE ONLY): 25 min  Problem List:  Patient Active Problem List   Diagnosis Date Noted   ICH (intracerebral hemorrhage) ([place]) 06/10/2022   Stupor 06/10/2022   Seizure ([place]) 10/28/2021   Hypertensive emergency    Acute ischemic left MCA stroke ([place]) 03/07/2021   Cerebrovascular accident (CVA) ([place])    Encounter for intubation    Pruritus ani 08/05/2020   History of hepatitis B 02/05/2020   Chronic diarrhea 07/24/2019   Proteinuria 07/16/2019   GAD (generalized anxiety disorder) 03/02/2019   Controlled substance agreement signed 03/02/2019   Hyperlipidemia associated with type 2 diabetes mellitus ([place]) 02/19/2019   CKD stage 3 due to type 2 diabetes mellitus ([place]) 02/19/2019   Primary hypertension 02/19/2019   Elevated alkaline phosphatase level 01/15/2019   Stage 3 chronic kidney disease ([place]) 06/15/2017   History of colonic polyps 05/09/2017   Presbycusis of both ears 06/04/2016   Hypothyroidism due to acquired atrophy of thyroid 04/26/2016   Vitamin D deficiency 04/26/2016   Status post total left knee replacement 09/15/2015   Statin intolerance 09/11/2015   Diabetic neuropathy, type II diabetes mellitus ([place]) 07/30/2015   Abnormal chest x-ray 11/27/2014   Right sided weakness 11/26/2014   Primary insomnia 04/12/2013   GERD (gastroesophageal reflux disease) 04/25/2012   Fatty liver 08/09/2011   Esophageal dysphagia 08/09/2011   Type 2 diabetes mellitus with diabetic nephropathy, without long-term current use of insulin ([place]) 08/09/2011   Past Medical History:  Past Medical History:  Diagnosis Date   Anxiety    Arthritis    Cataract    CVA (cerebral vascular accident) ([place]) 03/06/2021   Dizziness    Dysphagia    'sometimes but not a major issue' been checked out by GI (per pt)   Dysrhythmia     'heart used to skip but doesn't anymore' was checked out by Dr. Ron Parker late '90s, everything checked out ok and not had any skipping since (all per pt)   Essential hypertension, benign    Fatty liver    GERD (gastroesophageal reflux disease)    H/O hypercalcemia 04/12/2017   Hepatitis B    had at age 49, 'GI doc said it's gone away'   Hepatitis B surface antigen positive    High cholesterol    History of hiatal hernia    Hypercalcemia    Hypothyroidism    Kidney stone 12/2012   Patient was [facility] at [facility]   Obesity    Osteoporosis    PONV (postoperative nausea and vomiting)    Type 2 diabetes mellitus ([place])    Past Surgical History:  Past Surgical History:  Procedure Laterality Date   ABDOMINAL HYSTERECTOMY  1981   APPENDECTOMY  1981   BILATERAL CARPAL TUNNEL RELEASE Bilateral    Dr. Marlou Sa at surgical center   BIOPSY  05/26/2017   Procedure: BIOPSY;  Surgeon: Rogene Houston, MD;  [facility];  Service: Endoscopy;;  colon   BIOPSY  10/11/2018   Procedure: BIOPSY;  Surgeon: Rogene Houston, MD;  [facility];  Service: Endoscopy;;   CATARACT EXTRACTION, BILATERAL     COLONOSCOPY N/A 05/26/2017   Procedure: COLONOSCOPY;  Surgeon: Rogene Houston, MD;  [facility];  Service: Endoscopy;  Laterality: N/A;  10:30   Deviated septum repair     ESOPHAGEAL DILATION N/A 08/16/2019   Procedure:  ESOPHAGEAL DILATION;  Surgeon: Rogene Houston, MD;  [facility];  Service: Endoscopy;  Laterality: N/A;   ESOPHAGOGASTRODUODENOSCOPY N/A 07/28/2016   Procedure: ESOPHAGOGASTRODUODENOSCOPY (EGD);  Surgeon: Rogene Houston, MD;  [facility];  Service: Endoscopy;  Laterality: N/A;  300 - moved to 3/7 @ 1:00   ESOPHAGOGASTRODUODENOSCOPY N/A 08/16/2019   Procedure: ESOPHAGOGASTRODUODENOSCOPY (EGD);  Surgeon: Rogene Houston, MD;  [facility];  Service: Endoscopy;  Laterality: N/A;  [place]  N/A 10/11/2018   Procedure: FLEXIBLE SIGMOIDOSCOPY;  Surgeon: Rogene Houston, MD;  [facility];  Service: Endoscopy;  Laterality: N/A;  unable to complete colonoscopy    IR CT HEAD LTD  03/07/2021   IR PERCUTANEOUS ART THROMBECTOMY/INFUSION INTRACRANIAL INC DIAG ANGIO  03/07/2021   IR US GUIDE VASC ACCESS RIGHT  03/07/2021   KNEE ARTHROPLASTY Left 09/15/2015   Procedure: COMPUTER ASSISTED TOTAL KNEE ARTHROPLASTY;  Surgeon: Marybelle Killings, MD;  Location: [facility];  Service: Orthopedics;  Laterality: Left;   KNEE ARTHROSCOPY     left knee 2003   RADIOLOGY WITH ANESTHESIA N/A 03/07/2021   Procedure: IR WITH ANESTHESIA;  Surgeon: Corrie Mckusick, DO;  Location: [facility];  Service: Anesthesiology;  Laterality: N/A;   HPI:  Pt is is a 89 y.o. female who presented with acute onset of AMS, and worsening right-side weakness. She reportedly had an unwitnessed fall. MRI brain 1/19: Small focus of hemorrhage in the left anterior temporal lobe. PMH: HTN, HLD, DM, seizures, and remote L MCA stroke with residual right-sided deficits.   Assessment / Plan / Recommendation Clinical Impression  Pt was seen for speech-language evaluation. She reported that she lives with her son and received SLP services for aphasia after her prior CVA. Per the pt, she still had difficulty with reading and word retrieval after treatment, but that these symptoms are now notably worse. Pt presented with non-fluent aphasia. She was able to participate in simple conversation, but word retrieval difficulty was frequently noted. She was occasionally able to self-correct phonemic paraphasias during structured naming tasks and conversational discourse. She followed 1-step commands and accurately responded to simple yes/no questions, but increased difficulty was noted with auditory comprehension beyond these levels and with sentence-level reading comprehension. Skilled SLP services are clinically indicated at this time.    SLP  Assessment  SLP Recommendation/Assessment: Patient needs continued Speech [place] [facility] SLP Visit Diagnosis: Aphasia (R47.01)    Recommendations for follow up therapy are one component of a multi-disciplinary discharge planning process, led by the attending physician.  Recommendations may be updated based on patient status, additional functional criteria and insurance authorization.    Follow Up Recommendations  Skilled nursing-short term rehab (<3 hours/day)    Assistance Recommended at Discharge     Functional Status Assessment Patient has had a recent decline in their functional status and demonstrates the ability to make significant improvements in function in a reasonable and predictable amount of time.  Frequency and Duration min 2x/week  2 weeks      SLP Evaluation Cognition  Overall Cognitive Status: Difficult to assess (due to aphasia)       Comprehension  Auditory Comprehension Overall Auditory Comprehension: Impaired Yes/No Questions: Impaired Basic Immediate Environment Questions:  (5/5) Complex Questions:  (2/5) Commands: Impaired One Step Basic Commands:  (5/5) Two Step Basic Commands:  (1/4) Conversation: Simple Reading Comprehension Reading Status: Impaired Sentence Level: Impaired (1/4) Paragraph Level: Impaired  Expression Expression Primary Mode of Expression: Verbal Verbal Expression Overall Verbal Expression: Impaired Initiation: Impaired Automatic Speech: Counting;Day of week;Month of year (with repeated attempts and self=-correction) Level of Generative/Spontaneous Verbalization: Sentence Repetition: Impaired Level of Impairment: Sentence level (3/5) Naming: Impairment Responsive:  (1/5) Confrontation:  (7/10) Convergent:  (sentence completion: 1/5) Verbal Errors: Phonemic paraphasias   Oral / Motor  Oral Motor/Sensory Function Overall Oral Motor/Sensory Function: Within functional limits Motor Speech Overall Motor Speech:  Appears within functional limits for tasks assessed Respiration: Within functional limits Phonation: Normal Resonance: Within functional limits Articulation: Within functional limitis Intelligibility: Intelligible Motor Planning: Witnin functional limits           Kimberlie Csaszar I. [place] Negus, [place], [place] Office number [phone]  [name] 06/14/2022, 1:58 PM

## 2022-06-14 NOTE — Progress Notes (Signed)
Physical Therapy Treatment Patient Details Name: Lisa Sutton MRN: [id number] DOB: Jul 15, 1933 Today's Date: 06/14/2022   History of Present Illness 89 y.o. female presents to [facility] hospital 03/06/2021 with aphasia and fall. + for small ICH along left temporal cortex etiology indeterminate likely micro vascular disease from hypertension versus amyloid. PMH includes HTN, GERD, Hep B, HLD, Obesity, osteoporosis.    PT Comments    Pt greeted supine in bed and agreeable to session with continued progress towards acute goals. Pt continues to require assist during OOB mobility for stability and safety awareness as pt with noted R hemi-weakness and impaired balance/postural reactions. Pt requiring cues throughout session for RW proximity and for a wider BOS. Pt with continued word finding difficulties and difficulties with short term recall during session (ex; recalling room number and location). Pt son present and supportive throughout and current plan remains appropriate to address deficits and maximize functional independence and decrease caregiver burden. Will continue to follow acutely.    Recommendations for follow up therapy are one component of a multi-disciplinary discharge planning process, led by the attending physician.  Recommendations may be updated based on patient status, additional functional criteria and insurance authorization.  Follow Up Recommendations  Skilled nursing-short term rehab (<3 hours/day) Can patient physically be transported by private vehicle: Yes   Assistance Recommended at Discharge Frequent or constant Supervision/Assistance  Patient can return home with the following A little help with walking and/or transfers;A little help with bathing/dressing/bathroom;Assistance with cooking/housework;Direct supervision/assist for medications management;Direct supervision/assist for financial management;Assist for transportation;Help with stairs or ramp for entrance    Equipment Recommendations  None recommended by PT    Recommendations for Other Services       Precautions / Restrictions Precautions Precautions: Fall;Other (comment) Precaution Comments: seizure Restrictions Weight Bearing Restrictions: No     Mobility  Bed Mobility Overal bed mobility: Needs Assistance Bed Mobility: Supine to Sit, Sit to Supine     Supine to sit: Min guard Sit to supine: Min guard   General bed mobility comments: min guard for safety    Transfers Overall transfer level: Needs assistance Equipment used: Rolling walker (2 wheels), None Transfers: Sit to/from Stand Sit to Stand: Min guard           General transfer comment: min guard for safety    Ambulation/Gait Ambulation/Gait assistance: Min assist Gait Distance (Feet): 100 Feet (x3) Assistive device: Rolling walker (2 wheels), None Gait Pattern/deviations: Step-through pattern, Decreased stride length, Drifts right/left, Narrow base of support Gait velocity: decr     General Gait Details: Frequent cues for RW use, with tendency to push RW too far out in front, Min assist for balance and for wider BOS   Stairs             Wheelchair Mobility    Modified Rankin (Stroke Patients Only) Modified Rankin (Stroke Patients Only) Pre-Morbid Rankin Score: No significant disability Modified Rankin: Moderately severe disability     Balance Overall balance assessment: Needs assistance Sitting-balance support: No upper extremity supported, Feet supported Sitting balance-Leahy Scale: Good     Standing balance support: No upper extremity supported, During functional activity Standing balance-Leahy Scale: Fair Standing balance comment: no LOB for short gait bout without AD                            Cognition Arousal/Alertness: Awake/alert Behavior During Therapy: WFL for tasks assessed/performed Overall Cognitive Status: Difficult to assess (due to  aphasia) Area of  Impairment: Safety/judgement, Problem solving                       Following Commands: Follows one step commands consistently, Follows one step commands with increased time Safety/Judgement: Decreased awareness of safety, Decreased awareness of deficits   Problem Solving: Slow processing, Difficulty sequencing, Requires verbal cues, Requires tactile cues General Comments: Pt with difficulty finding words and occasional difficulty following commands.        Exercises      General Comments General comments (skin integrity, edema, etc.): pt able to don socks with set up, needing some cues to promblem solve to scoot out farther on EOB to maintain balance      Pertinent Vitals/Pain Pain Assessment Pain Assessment: No/denies pain    Home Living     Available Help at Discharge: Family;Available PRN/intermittently Type of Home: House                  Prior Function            PT Goals (current goals can now be found in the care plan section) Acute Rehab PT Goals PT Goal Formulation: With patient/family Time For Goal Achievement: 06/25/22 Progress towards PT goals: Progressing toward goals    Frequency    Min 3X/week      PT Plan      Co-evaluation              AM-PAC PT "6 Clicks" Mobility   Outcome Measure  Help needed turning from your back to your side while in a flat bed without using bedrails?: A Little Help needed moving from lying on your back to sitting on the side of a flat bed without using bedrails?: A Little Help needed moving to and from a bed to a chair (including a wheelchair)?: A Little Help needed standing up from a chair using your arms (e.g., wheelchair or bedside chair)?: A Little Help needed to walk in hospital room?: A Little Help needed climbing 3-5 steps with a railing? : A Lot 6 Click Score: 17    End of Session Equipment Utilized During Treatment: Gait belt Activity Tolerance: Patient tolerated treatment  well Patient left: with call bell/phone within reach;in bed;with bed alarm set;with family/visitor present Nurse Communication: Mobility status PT Visit Diagnosis: Unsteadiness on feet (R26.81);Other abnormalities of gait and mobility (R26.89);Muscle weakness (generalized) (M62.81);History of falling (Z91.81);Difficulty in walking, not elsewhere classified (R26.2)     Time: 5188-4166 PT Time Calculation (min) (ACUTE ONLY): 26 min  Charges:  $Gait Training: 8-22 mins $Therapeutic Activity: 8-22 mins                     Brigg Cape R. PTA Acute Rehabilitation Services Office: [place] 06/14/2022, 3:38 PM

## 2022-06-15 ENCOUNTER — Encounter (INDEPENDENT_AMBULATORY_CARE_PROVIDER_SITE_OTHER): Payer: Self-pay | Admitting: Gastroenterology

## 2022-06-15 LAB — GLUCOSE, CAPILLARY
Glucose-Capillary: 295 mg/dL — ABNORMAL HIGH (ref 70–99)
Glucose-Capillary: 219 mg/dL — ABNORMAL HIGH (ref 70–99)
Glucose-Capillary: 145 mg/dL — ABNORMAL HIGH (ref 70–99)
Glucose-Capillary: 143 mg/dL — ABNORMAL HIGH (ref 70–99)

## 2022-06-15 MED ORDER — OXYCODONE HCL 5 MG PO TABS
5.0000 mg | ORAL_TABLET | Freq: Once | ORAL | Status: AC
  Administered 2022-06-15: 5 mg via ORAL
  Filled 2022-06-15: qty 1

## 2022-06-15 NOTE — Progress Notes (Signed)
Mobility Specialist: Progress Note   06/15/22 1708  Mobility  Activity Ambulated with assistance in hallway  Level of Assistance Contact guard assist, steadying assist  Assistive Device Front wheel walker  Distance Ambulated (ft) 200 ft  Activity Response Tolerated well  Mobility Referral Yes  $Mobility charge 1 Mobility   Pt received in the bed and agreeable to mobility. Mod I with bed mobility and contact guard during ambulation. No c/o throughout. Pt back to bed after session with call bell and phone in reach. Bed alarm is on.   [place] Mobility Specialist Please contact via SecureChat or Rehab office at [phone]

## 2022-06-15 NOTE — Progress Notes (Signed)
OT Cancellation Note  Patient Details Name: Lisa Sutton MRN: [id number] DOB: March 17, 1934   Cancelled Treatment:    Reason Eval/Treat Not Completed: Patient declined, no reason specified;Fatigue/lethargy limiting ability to participate Pt reports recently receiving pain meds that has made her sleepy. Pt politely declines OT session d/t lethargy.  Layla Maw 06/15/2022, 1:31 PM

## 2022-06-15 NOTE — Plan of Care (Signed)
  Problem: Education: Goal: Knowledge of disease or condition will improve Outcome: Progressing   Problem: Coping: Goal: Will verbalize positive feelings about self Outcome: Progressing Goal: Will identify appropriate support needs Outcome: Progressing   Problem: Nutrition: Goal: Risk of aspiration will decrease Outcome: Progressing Goal: Dietary intake will improve Outcome: Progressing   Problem: Education: Goal: Knowledge of General Education information will improve Description: Including pain rating scale, medication(s)/side effects and non-pharmacologic comfort measures Outcome: Progressing   Problem: Nutritional: Goal: Maintenance of adequate nutrition will improve Outcome: Progressing   Problem: Skin Integrity: Goal: Risk for impaired skin integrity will decrease Outcome: Progressing

## 2022-06-15 NOTE — Discharge Summary (Signed)
[facility]  Physician Discharge Summary   Patient ID: Lisa Sutton MRN: [id number] DOB/AGE: 07-07-1933 89 y.o.  Admit date: 06/10/2022 Discharge date:   06/15/2022   PCP: Adaline Sill, NP  DISCHARGE DIAGNOSES:    ICH (intracerebral hemorrhage) ([place])   Seizure ([place])   RECOMMENDATIONS FOR OUTPATIENT FOLLOW UP: Check CBC and basic metabolic panel in 1 week Ambulatory referral sent to neurology for follow-up  Home Health: Going to SNF Equipment/Devices: None  CODE STATUS: Full code  DISCHARGE CONDITION: fair  Diet recommendation: Heart healthy  INITIAL HISTORY: 89 y.o. female with history of seizures, hypertension, hyperlipidemia, diabetes and left MCA stroke presenting after she had an unwitnessed fall with acute onset altered mental status and worsening right-sided weakness.  She does have a history of seizures and may have had a seizure prior to the unwitnessed fall, as she had her normal seizure prodrome of right-sided pain and sensory deficit before falling.  CT head shows small ICH along left temporal cortex.    Consultants: Neurology   Procedures: Echocardiogram   HOSPITAL COURSE:   Intracranial hemorrhage Found to have a small intracranial hemorrhage in the left hemisphere.  Seen by neurology.  Was on aspirin and Plavix prior to admission both of which were placed on hold. Echocardiogram showed EF of 55 to 60% with grade 1 diastolic dysfunction. LDL 30.  HbA1c 7.2. CT head repeated by neurology.  Thought to be stable.  Aspirin resumed per neurology. Seen by physical therapy.  Skilled nursing facility is recommended for short-term rehab.    Seizure disorder Continue Keppra.  Long-term monitoring did not show any seizure activity. Stable from a neurological standpoint.   Essential hypertension Goal blood pressure is less than 361 systolic.  May resume amlodipine at discharge.  Will continue to hold ARB due to CKD.   Chronic kidney disease  stage IV/hyperkalemia Baseline creatinine around 2.0.  Presented with creatinine of 2.4.  Renal function has been close to baseline.  Consider resuming ARB in the next 2 to 3 weeks if renal function remains stable.   Hyperlipidemia Continue statin.   Diabetes mellitus type 2, uncontrolled with hyperglycemia HbA1c 7.2.  May resume home medications   Normocytic anemia No overt bleeding apart from intracranial hemorrhage.  Hemoglobin has been stable.   Obesity Estimated body mass index is 30.23 kg/m as calculated from the following:   Height as of this encounter: '5\' 3"'$  (1.6 m).   Weight as of this encounter: 77.4 kg.  Patient is stable for the most part.  Complains of neuropathic pain in her lower extremities.  Noted to be slightly tachycardic this morning with heart rate in the 120s.  Noted to be sinus tachycardia.  Could be secondary to pain.  Will treat her pain and see if that improves her heart rate.  Not noted to be on beta-blocker prior to admission.  If heart rate improves she should be able to go to SNF if bed becomes available.  If heart rate does not improve then we will need to monitor her in the hospital a little longer.    PERTINENT LABS:  The results of significant diagnostics from this hospitalization (including imaging, microbiology, ancillary and laboratory) are listed below for reference.    Microbiology: Recent Results (from the past 240 hour(s))  MRSA Next Gen by PCR, Nasal     Status: None   Collection Time: 06/10/22  6:55 PM   Specimen: Nasal Mucosa; Nasal Swab  Result Value Ref Range Status  MRSA by PCR Next Gen NOT DETECTED NOT DETECTED Final    Comment: (NOTE) The GeneXpert MRSA Assay (FDA approved for NASAL specimens only), is one component of a comprehensive MRSA colonization surveillance program. It is not intended to diagnose MRSA infection nor to guide or monitor treatment for MRSA infections. Test performance is not FDA approved in patients less  than 87 years old. Performed at [facility], [facility] [address], [place], [place] [address]      Labs:   Basic Metabolic Panel: Recent Labs  Lab 06/11/22 0716 06/11/22 1112 06/11/22 1820 06/12/22 0150 06/13/22 0348  NA 137 137 137 138 137  K 5.4* 5.4* 4.8 4.7 4.4  CL 110 110 108 110 106  CO2 21* 20* 19* 19* 22  GLUCOSE 118* 111* 164* 115* 153*  BUN 28* 26* 26* 25* 31*  CREATININE 2.15* 2.12* 2.04* 1.98* 2.12*  CALCIUM 8.4* 8.6* 8.7* 8.5* 9.1    Liver Function Tests: Recent Labs  Lab 06/10/22 1000 06/10/22 1756  AST 23 28  ALT 14 16  ALKPHOS 77 75  BILITOT 0.7 0.9  PROT 6.3* 6.0*  ALBUMIN 3.4* 3.4*     CBC: Recent Labs  Lab 06/10/22 1000 06/11/22 0716 06/12/22 0150 06/13/22 0348  WBC 8.9 6.8 7.4 6.7  NEUTROABS 5.8 4.3  --   --   HGB 12.1  12.6 11.8* 10.9* 11.0*  HCT 38.1  37.0 36.8 34.0* 33.3*  MCV 87.8 86.8 86.3 85.4  PLT 234 226 219 208      CBG: Recent Labs  Lab 06/13/22 2055 06/14/22 0618 06/14/22 1133 06/14/22 1647 06/14/22 2119  GLUCAP 145* 163* 192* 124* 148*      IMAGING STUDIES CT HEAD WO CONTRAST (5MM)  Result Date: 06/13/2022 CLINICAL DATA:  Hemorrhagic stroke follow-up EXAM: CT HEAD WITHOUT CONTRAST TECHNIQUE: Contiguous axial images were obtained from the base of the skull through the vertex without intravenous contrast. RADIATION DOSE REDUCTION: This exam was performed according to the departmental dose-optimization program which includes automated exposure control, adjustment of the mA and/or kV according to patient size and/or use of iterative reconstruction technique. COMPARISON:  Brain MRI from 2 days ago FINDINGS: Brain: No change in the 4 mm cortical high-density along the superficial left temporal cortex. 4 mm thick subdural collection along the left cerebral convexity at the vertex, low-density. This was not seen on prior studies, see coronal reformats. Left cortical and white matter infarct centered at the parietal  lobe. Extensive chronic small vessel ischemia in the cerebral white matter. Chronic perforator infarct at the right basal ganglia. Generalized cerebral volume loss that is mild for age Vascular: No hyperdense vessel or unexpected calcification. Skull: Normal. Negative for fracture or focal lesion. Sinuses/Orbits: No acute finding. IMPRESSION: A small hygroma has developed along the left cerebral convexity measuring up to 4 mm in thickness. No change in the punctate hemorrhage at the left temporal lobe. Electronically Signed   By: Jorje Guild M.D.   On: 06/13/2022 07:12   MR BRAIN W WO CONTRAST  Result Date: 06/11/2022 CLINICAL DATA:  History of seizures and remote left MCA stroke presents with altered mental status and worsening right-sided weakness. EXAM: MRI HEAD WITHOUT AND WITH CONTRAST TECHNIQUE: Multiplanar, multiecho pulse sequences of the brain and surrounding structures were obtained without and with intravenous contrast. CONTRAST:  7.43m GADAVIST GADOBUTROL 1 MMOL/ML IV SOLN COMPARISON:  CT/CTA head and neck 1 day prior FINDINGS: Brain: There is no evidence of acute infarct There is a  small focus of SWI signal dropout with associated FLAIR signal abnormality in the left anterior temporal lobe corresponding to the hyperdensity seen on prior CT consistent with a small focus of hemorrhage measuring approximately 4 mm. There is no significant mass effect. There is no associated enhancement. There is no other acute intracranial hemorrhage. Parenchymal volume is stable. The ventricles are stable in size. The remote infarct in the left parietal lobe in the MCA distribution with associated encephalomalacia, gliosis, and cortical laminar necrosis is unchanged. There is unchanged ex vacuo dilatation of the right lateral ventricle. Additional remote infarcts in the right corona radiata and background advanced chronic small-vessel ischemic change are stable. The hippocampi are unremarkable. The pituitary and  suprasellar region are normal. There is no mass lesion or abnormal enhancement. There is no mass effect or midline shift. Vascular: Normal flow voids. Skull and upper cervical spine: Normal marrow signal. Sinuses/Orbits: There is mucosal thickening in the right maxillary sinus. Bilateral lens implants are in place. The globes and orbits are otherwise unremarkable. There are bilateral mastoid effusions, nonspecific and also present on the prior study from 2023. Other: None. IMPRESSION: 1. Small focus of hemorrhage in the left anterior temporal lobe is similar to the prior CT, allowing for differences in modality. No significant mass effect. 2. No other acute intracranial pathology. Electronically Signed   By: Valetta Mole M.D.   On: 06/11/2022 15:18   Overnight EEG with video  Result Date: 06/11/2022 Lora Havens, MD     06/11/2022 11:29 AM Patient Name: Lisa Sutton MRN: [id number] Epilepsy Attending: Lora Havens Referring Physician/Provider: Otelia Santee, NP Duration: 06/10/2022 1053 to 06/11/2022 1122 Patient history: 89 year old female with right-sided weakness, altered mental status.  EEG to evaluate for seizure. Level of alertness: Awake, asleep AEDs during EEG study: LEV Technical aspects: This EEG study was done with scalp electrodes positioned according to the 10-20 International system of electrode placement. Electrical activity was reviewed with band pass filter of 1-'70Hz'$ , sensitivity of 7 uV/mm, display speed of 9m/sec with a '60Hz'$  notched filter applied as appropriate. EEG data were recorded continuously and digitally stored.  Video monitoring was available and reviewed as appropriate. Description: The posterior dominant rhythm consists of 9-10 Hz activity of moderate voltage (25-35 uV) seen predominantly in posterior head regions, symmetric and reactive to eye opening and eye closing. Sleep was characterized by vertex waves, sleep spindles (12 to 14 Hz), maximal frontocentral region.  EEG showed intermittent 3-'5Hz'$  theta-delta slowing in left frontotemporal region. Hyperventilation and photic stimulation were not performed.   ABNORMALITY -Intermittent slow, left frontotemporal region. IMPRESSION: This study is suggestive of cortical dysfunction arising from left frontotemporal region likely secondary to underlying structural abnormality/ ICH. No seizures or definite epileptiform discharges were seen throughout the recording. PLora Havens  CT HEAD WO CONTRAST  Result Date: 06/10/2022 CLINICAL DATA:  Stroke, follow up EXAM: CT HEAD WITHOUT CONTRAST TECHNIQUE: Contiguous axial images were obtained from the base of the skull through the vertex without intravenous contrast. RADIATION DOSE REDUCTION: This exam was performed according to the departmental dose-optimization program which includes automated exposure control, adjustment of the mA and/or kV according to patient size and/or use of iterative reconstruction technique. COMPARISON:  CT head 06/10/2022 FINDINGS: Brain: Patchy and confluent areas of decreased attenuation are noted throughout the deep and periventricular white matter of the cerebral hemispheres bilaterally, compatible with chronic microvascular ischemic disease. Chronic left parietooccipital and right corona radiata infarction. No evidence of large-territorial acute  infarction. Persistent slightly less conspicuous a stable in size hyperdensity measuring approximately 4 mm along the left lateral temporal lobe (3:12). No extra-axial acute fluid. Slight enhancing vasculature due to prior CT angiography head neck 06/10/2022. No mass lesion. No extra-axial collection. No mass effect or midline shift. No hydrocephalus. Basilar cisterns are patent. Vascular: No hyperdense vessel. Atherosclerotic calcifications are present within the cavernous internal carotid and vertebral arteries. Skull: No acute fracture or focal lesion. Sinuses/Orbits: Paranasal sinuses and mastoid air cells  are clear. The orbits are unremarkable. Other: None. IMPRESSION: No change with persistent slightly less conspicuous a stable in size hyperdensity measuring approximately 4 mm along the left lateral temporal lobe. Electronically Signed   By: Iven Finn M.D.   On: 06/10/2022 20:18   ECHOCARDIOGRAM COMPLETE  Result Date: 06/10/2022    ECHOCARDIOGRAM REPORT   Patient Name:   Lisa Sutton Date of Exam: 06/10/2022 Medical Rec #:  [id number]         Height:       63.0 in Accession #:    [id number]        Weight:       173.3 lb Date of Birth:  [birth year]         BSA:          1.819 m Patient Age:    63 years          BP:           107/76 mmHg Patient Gender: F                 HR:           79 bpm. Exam Location:  Inpatient Procedure: 2D Echo, Cardiac Doppler and Color Doppler Indications:    Stroke I63.9  History:        Patient has prior history of Echocardiogram examinations, most                 recent 03/08/2021. Stroke, Signs/Symptoms:Altered Mental Status;                 Risk Factors:Diabetes, Dyslipidemia and Hypertension.  Sonographer:    Greer Pickerel Referring Phys: [id number] Otelia Santee  Sonographer Comments: Technically difficult study due to poor echo windows. Image acquisition challenging due to patient body habitus and Image acquisition challenging due to respiratory motion. IMPRESSIONS  1. Challenging windows. Left ventricular ejection fraction, by estimation, is 55 to 60%. The left ventricle has normal function. The left ventricle has no regional wall motion abnormalities. There is mild left ventricular hypertrophy. Left ventricular diastolic parameters are consistent with Grade I diastolic dysfunction (impaired relaxation).  2. Right ventricular systolic function is normal. The right ventricular size is normal.  3. Left atrial size was mild to moderately dilated.  4. No evidence of mitral valve regurgitation. Moderate mitral annular calcification.  5. The aortic valve was not well visualized.  Aortic valve regurgitation is not visualized.  6. The inferior vena cava is normal in size with greater than 50% respiratory variability, suggesting right atrial pressure of 3 mmHg. Comparison(s): No significant change from prior study. FINDINGS  Left Ventricle: Challenging windows. Left ventricular ejection fraction, by estimation, is 55 to 60%. The left ventricle has normal function. The left ventricle has no regional wall motion abnormalities. The left ventricular internal cavity size was normal in size. There is mild left ventricular hypertrophy. Left ventricular diastolic parameters are consistent with Grade I diastolic dysfunction (impaired relaxation). Right Ventricle: The right ventricular  size is normal. Right ventricular systolic function is normal. Left Atrium: Left atrial size was mild to moderately dilated. Right Atrium: Right atrial size was normal in size. Pericardium: There is no evidence of pericardial effusion. Mitral Valve: Moderate mitral annular calcification. No evidence of mitral valve regurgitation. Tricuspid Valve: Tricuspid valve regurgitation is not demonstrated. Aortic Valve: The aortic valve was not well visualized. Aortic valve regurgitation is not visualized. Pulmonic Valve: The pulmonic valve was not well visualized. Aorta: The aortic root and ascending aorta are structurally normal, with no evidence of dilitation. Venous: The inferior vena cava is normal in size with greater than 50% respiratory variability, suggesting right atrial pressure of 3 mmHg. IAS/Shunts: The interatrial septum was not well visualized.  LEFT VENTRICLE PLAX 2D LVOT diam:     1.50 cm     Diastology LV SV:         39          LV e' medial:    6.68 cm/s LV SV Index:   21          LV E/e' medial:  8.9 LVOT Area:     1.77 cm    LV e' lateral:   5.75 cm/s                            LV E/e' lateral: 10.3  LV Volumes (MOD) LV vol d, MOD A2C: 40.5 ml LV vol d, MOD A4C: 87.8 ml LV vol s, MOD A2C: 29.7 ml LV vol s, MOD  A4C: 45.4 ml LV SV MOD A2C:     10.8 ml LV SV MOD A4C:     87.8 ml LV SV MOD BP:      23.4 ml RIGHT VENTRICLE RV S prime:     11.80 cm/s TAPSE (M-mode): 2.2 cm LEFT ATRIUM             Index        RIGHT ATRIUM           Index LA Vol (A2C):   76.6 ml 42.10 ml/m  RA Area:     19.50 cm LA Vol (A4C):   74.2 ml 40.78 ml/m  RA Volume:   53.30 ml  29.30 ml/m LA Biplane Vol: 80.7 ml 44.36 ml/m  AORTIC VALVE LVOT Vmax:   116.50 cm/s LVOT Vmean:  76.800 cm/s LVOT VTI:    0.218 m  AORTA Ao Root diam: 3.20 cm MITRAL VALVE MV Area (PHT): 3.72 cm     SHUNTS MV Decel Time: 204 msec     Systemic VTI:  0.22 m MV E velocity: 59.20 cm/s   Systemic Diam: 1.50 cm MV A velocity: 112.00 cm/s MV E/A ratio:  0.53 Landscape architect signed by Phineas Inches Signature Date/Time: 06/10/2022/2:55:13 PM    Final    CT ANGIO HEAD NECK W WO CM (CODE STROKE)  Result Date: 06/10/2022 CLINICAL DATA:  Provided history: Neuro deficit, acute, stroke suspected. Acute on chronic right-sided weakness, aphasia, unresponsiveness, possible seizure, chronic left MCA stroke. EXAM: CT ANGIOGRAPHY HEAD AND NECK TECHNIQUE: Multidetector CT imaging of the head and neck was performed using the standard protocol during bolus administration of intravenous contrast. Multiplanar CT image reconstructions and MIPs were obtained to evaluate the vascular anatomy. Carotid stenosis measurements (when applicable) are obtained utilizing NASCET criteria, using the distal internal carotid diameter as the denominator. RADIATION DOSE REDUCTION: This exam was performed according to the departmental dose-optimization program which  includes automated exposure control, adjustment of the mA and/or kV according to patient size and/or use of iterative reconstruction technique. CONTRAST:  55m OMNIPAQUE IOHEXOL 350 MG/ML SOLN COMPARISON:  Contrast head CT performed earlier the same day 06/10/2022. CT angiogram head/neck 10/28/2021. FINDINGS: CTA NECK FINDINGS Aortic arch:  Standard aortic branching. Atherosclerotic plaque within the visualized aortic arch and proximal major branch vessels of the neck., 6 Streak and beam hardening artifact arising from a dense left-sided contrast bolus partially obscures the left subclavian artery. Within this limitation, there is no appreciable hemodynamically significant innominate or proximal subclavian artery stenosis. Right carotid system: CCA and ICA patent within the neck without stenosis or significant atherosclerotic disease. Left carotid system: CCA and ICA patent within the neck without stenosis. Mild atherosclerotic plaque about the carotid bifurcation. Vertebral arteries: Vertebral arteries codominant and patent within the neck. Redemonstrated moderate to severe atherosclerotic narrowing at the origin of the left vertebral artery. Skeleton: Cervical spondylosis. Congenital nonunion of the posterior arch of C1. No acute fracture or aggressive osseous lesion. Other neck: No neck mass or cervical lymphadenopathy. Upper chest: No consolidation within the imaged lung apices. Review of the MIP images confirms the above findings CTA HEAD FINDINGS Anterior circulation: The intracranial internal carotid arteries are patent. Atherosclerotic plaque within the intracranial right ICA with no more than mild stenosis. Mild atherosclerotic stenosis within the left cavernous segment. Up to moderate atherosclerotic narrowing within the paraclinoid left ICA. The M1 middle cerebral arteries are patent. Atherosclerotic irregularity of the M2 and more distal MCA vessels, bilaterally. Most notably, there is an unchanged moderate/severe focal stenosis within a mid M2 left MCA vessel (series 11, image 13). The anterior cerebral arteries are patent. Mild atherosclerotic irregularity of both vessels. No intracranial aneurysm is identified. Posterior circulation: The intracranial vertebral arteries are patent. The basilar artery is patent. The posterior cerebral  arteries are patent. Atherosclerotic irregularity of both vessels, most notably as follows. New severe stenosis within the left PCA P1 segment. Redemonstrated mild-to-moderate focal stenosis within the left PCA P2 segment. Redemonstrated moderate/severe stenosis within the right PCA P2 segment. Small posterior communicating arteries are present, bilaterally. Venous sinuses: Within the limitations of contrast timing, no convincing thrombus. Anatomic variants: As described. Review of the MIP images confirms the above findings CTA head impressions #1 and #3 called by telephone at the time of interpretation on 06/10/2022 at 10:35 am to [facility], who verbally acknowledged these results. IMPRESSION: CTA neck: 1. The common carotid and internal carotid arteries are patent within the neck without stenosis. Mild atherosclerotic plaque about the left carotid bifurcation. 2. The vertebral arteries are patent within the neck. Redemonstrated moderate-to-severe atherosclerotic stenosis at the origin of the left vertebral artery. CTA head: 1. No intracranial large vessel occlusion is identified. 2. Intracranial atherosclerotic disease, as outlined and most notably as follows. 3. Severe stenosis within the left PCA P1 segment, new from the prior CTA of 10/28/2021. 4. Redemonstrated moderate/severe stenosis within the right PCA P2 segment. 5. Redemonstrated moderate/severe stenosis within a mid M2 left MCA vessel. Electronically Signed   By: KKellie SimmeringD.O.   On: 06/10/2022 10:35   CT HEAD CODE STROKE WO CONTRAST  Result Date: 06/10/2022 CLINICAL DATA:  Code stroke.  Right handed weakness EXAM: CT HEAD WITHOUT CONTRAST TECHNIQUE: Contiguous axial images were obtained from the base of the skull through the vertex without intravenous contrast. RADIATION DOSE REDUCTION: This exam was performed according to the departmental dose-optimization program which includes automated exposure  control, adjustment of the mA  and/or kV according to patient size and/or use of iterative reconstruction technique. COMPARISON:  10/28/2021 FINDINGS: Brain: New small hyperdensity along the superficial cortex of the lateral left temporal lobe measuring 4 mm. Chronic left parietooccipital cortically based infarct. Chronic perforator infarct at the right corona radiata. No hydrocephalus or masslike finding. Generalized cerebral volume loss and extensive chronic small vessel ischemia. Vascular: No hyperdense vessel or unexpected calcification. Skull: Normal. Negative for fracture or focal lesion. Sinuses/Orbits: No acute finding. Other: These results were called by telephone at the time of interpretation on 06/10/2022 at 10:03 am to provider [facility] , who verbally acknowledged these results. IMPRESSION: 1. New 4 mm high-density along the left temporal cortex suggesting acute hemorrhage. No acute infarct noted. 2. Chronic left parieto-occipital and right corona radiata infarcts. Electronically Signed   By: Jorje Guild M.D.   On: 06/10/2022 10:10    DISCHARGE EXAMINATION: Vitals:   06/14/22 1944 06/14/22 2346 06/15/22 0347 06/15/22 0739  BP: 135/77 124/70 (!) 130/116 124/76  Pulse: 87 98 (!) 101 (!) 101  Resp: '16 18 18 20  '$ Temp: (!) 97.4 F (36.3 C) 98.6 F (37 C) 98.9 F (37.2 C) 97.6 F (36.4 C)  TempSrc: Oral Oral Oral Oral  SpO2: 97% 93% 96% 97%  Weight:      Height:       General appearance: Awake alert.  In no distress Resp: Clear to auscultation bilaterally.  Normal effort Cardio: S1-S2 is tachycardic regular GI: Abdomen is soft.  Nontender nondistended.  Bowel sounds are present normal.  No masses organomegaly   DISPOSITION: SNF  Discharge Instructions     Ambulatory referral to Neurology   Complete by: As directed    Follow up with Dr. Leta Baptist at [facility] in 4-6 weeks.  Patient is Dr. Gladstone Lighter patient. Thanks.   Call MD for:  difficulty breathing, headache or visual disturbances   Complete by: As  directed    Call MD for:  hives   Complete by: As directed    Call MD for:  persistant dizziness or light-headedness   Complete by: As directed    Call MD for:  persistant nausea and vomiting   Complete by: As directed    Call MD for:  severe uncontrolled pain   Complete by: As directed    Call MD for:  temperature >100.4   Complete by: As directed    Diet - low sodium heart healthy   Complete by: As directed    Discharge instructions   Complete by: As directed    Please review instructions on the discharge summary  You were cared for by a hospitalist during your hospital stay. If you have any questions about your discharge medications or the care you received while you were in the hospital after you are discharged, you can call the unit and asked to speak with the hospitalist on call if the hospitalist that took care of you is not available. Once you are discharged, your primary care physician will handle any further medical issues. Please note that NO REFILLS for any discharge medications will be authorized once you are discharged, as it is imperative that you return to your primary care physician (or establish a relationship with a primary care physician if you do not have one) for your aftercare needs so that they can reassess your need for medications and monitor your lab values. If you do not have a primary care physician, you can call [phone] for  a physician referral.   Increase activity slowly   Complete by: As directed           Allergies as of 06/15/2022       Reactions   Statins Other (See Comments)   'discomfort, aching everywhere' Tolerates to livalo    Empagliflozin Other (See Comments)   Other    Allergic to diabetic medication but unsure of name. Went to ED February 2023 (ED note I do not see name of med)    Zetia [ezetimibe] Other (See Comments)   Leg pain   Zocor [simvastatin] Other (See Comments)   Leg pain        Medication List     STOP taking these  medications    clopidogrel 75 MG tablet Commonly known as: Plavix   diphenhydrAMINE 25 MG tablet Commonly known as: BENADRYL   hydrOXYzine 50 MG tablet Commonly known as: ATARAX   losartan 100 MG tablet Commonly known as: COZAAR       TAKE these medications    amLODipine 10 MG tablet Commonly known as: NORVASC Take 10 mg by mouth daily.   aspirin EC 81 MG tablet Take 81 mg by mouth daily. Swallow whole.   cholecalciferol 25 MCG (1000 UNIT) tablet Commonly known as: VITAMIN D3 Take 1,000 Units by mouth daily.   glipiZIDE 5 MG tablet Commonly known as: GLUCOTROL Take 5 mg by mouth every morning.   Jardiance 10 MG Tabs tablet Generic drug: empagliflozin Take 10 mg by mouth daily.   levETIRAcetam 250 MG tablet Commonly known as: KEPPRA Take 1 tablet (250 mg total) by mouth 2 (two) times daily.   levothyroxine 125 MCG tablet Commonly known as: SYNTHROID Take 1 tablet (125 mcg total) by mouth daily before breakfast.   meclizine 25 MG tablet Commonly known as: ANTIVERT Take 25 mg by mouth 2 (two) times daily as needed for dizziness.   metFORMIN 500 MG 24 hr tablet Commonly known as: GLUCOPHAGE-XR Take 500 mg by mouth daily.   pantoprazole 40 MG tablet Commonly known as: PROTONIX Take 1 tablet (40 mg total) by mouth daily before lunch.   rosuvastatin 40 MG tablet Commonly known as: CRESTOR Take 40 mg by mouth at bedtime.   triamcinolone cream 0.1 % Commonly known as: KENALOG Apply 1 application. topically daily as needed (irritation).   Valtoco 5 MG Dose 5 MG/0.1ML Liqd Generic drug: diazePAM Spray in nose if seizure last more than 5 minutes. Can repeat dose in 4 hours. Do not use more than twice in 24 hours.          Follow-up Information     Penumalli, Earlean Polka, MD. Schedule an appointment as soon as possible for a visit in 1 month(s).   Specialties: Neurology, Radiology Contact information: [address] [place]  [address] [phone]                 TOTAL DISCHARGE TIME: 35 minutes  [place]  [facility] Pager on [email or web address]  06/15/2022, 9:59 AM

## 2022-06-15 NOTE — Plan of Care (Signed)
  Problem: Education: Goal: Knowledge of disease or condition will improve Outcome: Progressing Goal: Knowledge of secondary prevention will improve (MUST DOCUMENT ALL) Outcome: Progressing Goal: Knowledge of patient specific risk factors will improve Lisa Sutton N/A or DELETE if not current risk factor) Outcome: Progressing   Problem: Intracerebral Hemorrhage Tissue Perfusion: Goal: Complications of Intracerebral Hemorrhage will be minimized Outcome: Progressing   Problem: Coping: Goal: Will verbalize positive feelings about self Outcome: Progressing Goal: Will identify appropriate support needs Outcome: Progressing   Problem: Health Behavior/Discharge Planning: Goal: Ability to manage health-related needs will improve Outcome: Progressing Goal: Goals will be collaboratively established with patient/family Outcome: Progressing   Problem: Self-Care: Goal: Ability to participate in self-care as condition permits will improve Outcome: Progressing Goal: Verbalization of feelings and concerns over difficulty with self-care will improve Outcome: Progressing Goal: Ability to communicate needs accurately will improve Outcome: Progressing   Problem: Nutrition: Goal: Risk of aspiration will decrease Outcome: Progressing Goal: Dietary intake will improve Outcome: Progressing   Problem: Education: Goal: Knowledge of General Education information will improve Description: Including pain rating scale, medication(s)/side effects and non-pharmacologic comfort measures Outcome: Progressing   Problem: Health Behavior/Discharge Planning: Goal: Ability to manage health-related needs will improve Outcome: Progressing   Problem: Clinical Measurements: Goal: Ability to maintain clinical measurements within normal limits will improve Outcome: Progressing Goal: Will remain free from infection Outcome: Progressing Goal: Diagnostic test results will improve Outcome: Progressing Goal:  Respiratory complications will improve Outcome: Progressing Goal: Cardiovascular complication will be avoided Outcome: Progressing   Problem: Activity: Goal: Risk for activity intolerance will decrease Outcome: Progressing   Problem: Nutrition: Goal: Adequate nutrition will be maintained Outcome: Progressing   Problem: Coping: Goal: Level of anxiety will decrease Outcome: Progressing   Problem: Elimination: Goal: Will not experience complications related to bowel motility Outcome: Progressing Goal: Will not experience complications related to urinary retention Outcome: Progressing   Problem: Pain Managment: Goal: General experience of comfort will improve Outcome: Progressing   Problem: Safety: Goal: Ability to remain free from injury will improve Outcome: Progressing   Problem: Skin Integrity: Goal: Risk for impaired skin integrity will decrease Outcome: Progressing   Problem: Education: Goal: Ability to describe self-care measures that may prevent or decrease complications (Diabetes Survival Skills Education) will improve Outcome: Progressing Goal: Individualized Educational Video(s) Outcome: Progressing   Problem: Coping: Goal: Ability to adjust to condition or change in health will improve Outcome: Progressing   Problem: Fluid Volume: Goal: Ability to maintain a balanced intake and output will improve Outcome: Progressing   Problem: Health Behavior/Discharge Planning: Goal: Ability to identify and utilize available resources and services will improve Outcome: Progressing Goal: Ability to manage health-related needs will improve Outcome: Progressing   Problem: Metabolic: Goal: Ability to maintain appropriate glucose levels will improve Outcome: Progressing   Problem: Nutritional: Goal: Maintenance of adequate nutrition will improve Outcome: Progressing Goal: Progress toward achieving an optimal weight will improve Outcome: Progressing   Problem:  Skin Integrity: Goal: Risk for impaired skin integrity will decrease Outcome: Progressing   Problem: Tissue Perfusion: Goal: Adequacy of tissue perfusion will improve Outcome: Progressing

## 2022-06-15 NOTE — TOC Progression Note (Signed)
Transition of Care [facility]) - Progression Note    Patient Details  Name: Lisa Sutton MRN: [id number] Date of Birth: May 10, 1934  Transition of Care [facility]) CM/SW [place], [place] Phone Number: 06/15/2022, 3:43 PM  Clinical Narrative:   CSW continuing to contact [place] to send referral on patient for SNF, left messages requesting call back. CSW notified patient's son that CSW is unable to get anyone on the phone to discuss patient's admission. CSW to follow.    Expected Discharge Plan: [place] Barriers to Discharge: Continued Medical Work up, Ship broker  Expected Discharge Plan and Services     Post Acute Care Choice: [place] Living arrangements for the past 2 months: Single Family Home                                       Social Determinants of Health (SDOH) Interventions SDOH Screenings   Food Insecurity: No Food Insecurity (06/10/2022)  Housing: Low Risk  (06/10/2022)  Transportation Needs: No Transportation Needs (06/10/2022)  Utilities: Not At Risk (06/10/2022)  Alcohol Screen: Low Risk  (09/05/2019)  Depression (PHQ2-9): Low Risk  (07/16/2019)  Financial Resource Strain: Low Risk  (09/05/2019)  Physical Activity: Sufficiently Active (09/05/2019)  Social Connections: Moderately Integrated (09/05/2019)  Stress: No Stress Concern Present (09/05/2019)  Tobacco Use: Low Risk  (06/14/2022)    Readmission Risk Interventions     No data to display

## 2022-06-15 NOTE — Progress Notes (Signed)
Physical Therapy Treatment Patient Details Name: Lisa Sutton MRN: [id number] DOB: 1934-03-26 Today's Date: 06/15/2022   History of Present Illness 89 y.o. female presents to [facility] hospital 03/06/2021 with aphasia and fall. + for small ICH along left temporal cortex etiology indeterminate likely micro vascular disease from hypertension versus amyloid. PMH includes HTN, GERD, Hep B, HLD, Obesity, osteoporosis.    PT Comments    Pt with continues progress towards acute goals this date with increased tolerance for gait, however pt continues to need max cues for safety awareness and continues to demonstrate lack of insight into deficits. Pt requiring min assist throughout gait for stability and for cues for RW proximity and upright posture. Pt able to complete stair negotiation with max cues throughout for sequencing with pt demonstrating poor carryover. Pt continues to be at high risk for falls secondary to poor safety awareness, impaired balance/postural and decreased insight into deficits. Current plan remains appropriate to address deficits and maximize functional independence and decrease caregiver burden. Pt continues to benefit from skilled PT services to progress toward functional mobility goals.     Recommendations for follow up therapy are one component of a multi-disciplinary discharge planning process, led by the attending physician.  Recommendations may be updated based on patient status, additional functional criteria and insurance authorization.  Follow Up Recommendations  Skilled nursing-short term rehab (<3 hours/day) Can patient physically be transported by private vehicle: Yes   Assistance Recommended at Discharge Frequent or constant Supervision/Assistance  Patient can return home with the following A little help with walking and/or transfers;A little help with bathing/dressing/bathroom;Assistance with cooking/housework;Direct supervision/assist for medications management;Direct  supervision/assist for financial management;Assist for transportation;Help with stairs or ramp for entrance   Equipment Recommendations  None recommended by PT    Recommendations for Other Services       Precautions / Restrictions Precautions Precautions: Fall;Other (comment) Precaution Comments: seizure Restrictions Weight Bearing Restrictions: No     Mobility  Bed Mobility Overal bed mobility: Needs Assistance Bed Mobility: Supine to Sit, Sit to Supine     Supine to sit: Min guard Sit to supine: Min guard   General bed mobility comments: min guard for safety    Transfers Overall transfer level: Needs assistance Equipment used: Rolling walker (2 wheels) Transfers: Sit to/from Stand Sit to Stand: Min guard           General transfer comment: min guard for safety    Ambulation/Gait Ambulation/Gait assistance: Min assist Gait Distance (Feet): 200 Feet Assistive device: Rolling walker (2 wheels) Gait Pattern/deviations: Step-through pattern, Decreased stride length, Drifts right/left, Narrow base of support Gait velocity: decr     General Gait Details: Frequent cues for RW use, with tendency to push RW too far out in front, Min assist for balance   Stairs Stairs: Yes Stairs assistance: Mod assist Stair Management: Two rails, Step to pattern, Alternating pattern, Forwards Number of Stairs: 6 General stair comments: ascent/descent of 2 steps in gym x3 bouts max cues for sequencing each step as pt with tendecny for step over step despite cues for safety and knee buckling when stepping up with R   Wheelchair Mobility    Modified Rankin (Stroke Patients Only) Modified Rankin (Stroke Patients Only) Pre-Morbid Rankin Score: No significant disability Modified Rankin: Moderately severe disability     Balance Overall balance assessment: Needs assistance Sitting-balance support: No upper extremity supported, Feet supported Sitting balance-Leahy Scale: Good      Standing balance support: No upper extremity supported, During functional  activity Standing balance-Leahy Scale: Fair                              Cognition Arousal/Alertness: Awake/alert Behavior During Therapy: WFL for tasks assessed/performed Overall Cognitive Status: Difficult to assess (due to aphasia) Area of Impairment: Safety/judgement, Problem solving                       Following Commands: Follows one step commands consistently Safety/Judgement: Decreased awareness of safety, Decreased awareness of deficits   Problem Solving: Slow processing, Difficulty sequencing, Requires verbal cues, Requires tactile cues General Comments: Pt with difficulty finding words and occasional difficulty following commands.        Exercises      General Comments General comments (skin integrity, edema, etc.): pt continues to need max cues for safety and to maintain Rw in contact with ground as pt lifting RW during turns x3 despite cues      Pertinent Vitals/Pain Pain Assessment Pain Assessment: No/denies pain    Home Living                          Prior Function            PT Goals (current goals can now be found in the care plan section) Acute Rehab PT Goals PT Goal Formulation: With patient/family Time For Goal Achievement: 06/25/22 Progress towards PT goals: Progressing toward goals    Frequency    Min 3X/week      PT Plan      Co-evaluation              AM-PAC PT "6 Clicks" Mobility   Outcome Measure  Help needed turning from your back to your side while in a flat bed without using bedrails?: A Little Help needed moving from lying on your back to sitting on the side of a flat bed without using bedrails?: A Little Help needed moving to and from a bed to a chair (including a wheelchair)?: A Little Help needed standing up from a chair using your arms (e.g., wheelchair or bedside chair)?: A Little Help needed to walk in  hospital room?: A Little Help needed climbing 3-5 steps with a railing? : A Lot 6 Click Score: 17    End of Session Equipment Utilized During Treatment: Gait belt Activity Tolerance: Patient tolerated treatment well Patient left: with call bell/phone within reach;in bed;with bed alarm set;with family/visitor present Nurse Communication: Mobility status PT Visit Diagnosis: Unsteadiness on feet (R26.81);Other abnormalities of gait and mobility (R26.89);Muscle weakness (generalized) (M62.81);History of falling (Z91.81);Difficulty in walking, not elsewhere classified (R26.2)     Time: 0998-3382 PT Time Calculation (min) (ACUTE ONLY): 20 min  Charges:  $Gait Training: 8-22 mins                     Audry Riles. PTA Acute Rehabilitation Services Office: [place] 06/15/2022, 3:48 PM

## 2022-06-16 LAB — BASIC METABOLIC PANEL
Anion gap: 11 (ref 5–15)
BUN: 42 mg/dL — ABNORMAL HIGH (ref 8–23)
CO2: 21 mmol/L — ABNORMAL LOW (ref 22–32)
Calcium: 8.8 mg/dL — ABNORMAL LOW (ref 8.9–10.3)
Chloride: 106 mmol/L (ref 98–111)
Creatinine, Ser: 2.32 mg/dL — ABNORMAL HIGH (ref 0.44–1.00)
GFR, Estimated: 20 mL/min — ABNORMAL LOW (ref >60)
Glucose, Bld: 154 mg/dL — ABNORMAL HIGH (ref 70–99)
Potassium: 4.9 mmol/L (ref 3.5–5.1)
Sodium: 138 mmol/L (ref 135–145)

## 2022-06-16 LAB — CBC
HCT: 38.1 % (ref 36.0–46.0)
Hemoglobin: 12 g/dL (ref 12.0–15.0)
MCH: 27.1 pg (ref 26.0–34.0)
MCHC: 31.5 g/dL (ref 30.0–36.0)
MCV: 86.2 fL (ref 80.0–100.0)
Platelets: 211 10*3/uL (ref 150–400)
RBC: 4.42 MIL/uL (ref 3.87–5.11)
RDW: 14.2 % (ref 11.5–15.5)
WBC: 6.7 10*3/uL (ref 4.0–10.5)
nRBC: 0 % (ref 0.0–0.2)

## 2022-06-16 LAB — GLUCOSE, CAPILLARY
Glucose-Capillary: 79 mg/dL (ref 70–99)
Glucose-Capillary: 173 mg/dL — ABNORMAL HIGH (ref 70–99)
Glucose-Capillary: 138 mg/dL — ABNORMAL HIGH (ref 70–99)
Glucose-Capillary: 163 mg/dL — ABNORMAL HIGH (ref 70–99)

## 2022-06-16 MED ORDER — HYDROCODONE-ACETAMINOPHEN 5-325 MG PO TABS
1.0000 | ORAL_TABLET | Freq: Once | ORAL | Status: AC | PRN
  Administered 2022-06-17: 1 via ORAL
  Filled 2022-06-16: qty 1

## 2022-06-16 MED ORDER — NALOXONE HCL 0.4 MG/ML IJ SOLN: 0.4000 mg | INTRAMUSCULAR | Status: DC | PRN

## 2022-06-16 MED ORDER — MELATONIN 3 MG PO TABS
3.0000 mg | ORAL_TABLET | Freq: Every evening | ORAL | Status: DC | PRN
  Administered 2022-06-17 (×2): 3 mg via ORAL
  Filled 2022-06-16 (×2): qty 1

## 2022-06-16 MED ORDER — OXYCODONE HCL 5 MG PO TABS: 2.5000 mg | ORAL_TABLET | Freq: Two times a day (BID) | ORAL | 0 refills | Status: DC | PRN

## 2022-06-16 NOTE — Progress Notes (Signed)
Occupational Therapy Treatment Patient Details Name: Lisa Sutton MRN: [id number] DOB: [birth year] Today's Date: 06/16/2022   History of present illness 89 y.o. female presents to [facility] hospital 03/06/2021 with aphasia and fall. + for small ICH along left temporal cortex etiology indeterminate likely micro vascular disease from hypertension versus amyloid. PMH includes HTN, GERD, Hep B, HLD, Obesity, osteoporosis.   OT comments  Pt progressing towards established OT goals. Oriented to day of the week this session with increased time for word finding. Pt performing hair care standing at sink with min guard A using BUE above head to scrub hair with shampoo cap. Challenging BUE strength and activity tolerance with IADL of washing mirror; pt with increased effort to perform with RUE. Greater awareness/insight to request rest break. Brushing hair sitting EOB with supervision. Pt with improved ability to follow commands, however, continues to require cueing for safe use of RW. Continue to recommend SNF for continued OT services.    Recommendations for follow up therapy are one component of a multi-disciplinary discharge planning process, led by the attending physician.  Recommendations may be updated based on patient status, additional functional criteria and insurance authorization.    Follow Up Recommendations  Skilled nursing-short term rehab (<3 hours/day)     Assistance Recommended at Discharge Frequent or constant Supervision/Assistance  Patient can return home with the following  A little help with walking and/or transfers;A little help with bathing/dressing/bathroom;Assistance with cooking/housework;Direct supervision/assist for medications management;Direct supervision/assist for financial management;Assist for transportation;Help with stairs or ramp for entrance   Equipment Recommendations  None recommended by OT    Recommendations for Other Services      Precautions / Restrictions  Precautions Precautions: Fall;Other (comment) Precaution Comments: seizure Restrictions Weight Bearing Restrictions: No       Mobility Bed Mobility Overal bed mobility: Needs Assistance Bed Mobility: Supine to Sit, Sit to Supine     Supine to sit: Min guard Sit to supine: Min guard   General bed mobility comments: min guard for safety    Transfers Overall transfer level: Needs assistance Equipment used: Rolling walker (2 wheels) Transfers: Sit to/from Stand Sit to Stand: Min guard           General transfer comment: min guard for safety     Balance Overall balance assessment: Needs assistance Sitting-balance support: No upper extremity supported, Feet supported Sitting balance-Leahy Scale: Good     Standing balance support: Bilateral upper extremity supported, No upper extremity supported, During functional activity Standing balance-Leahy Scale: Fair Standing balance comment: No LOB for static standing or hair care standing at sink                           ADL either performed or assessed with clinical judgement   ADL Overall ADL's : Needs assistance/impaired     Grooming: Min guard;Brushing hair;Standing Grooming Details (indicate cue type and reason): Applied shampoo cap standing at sink and pt reaching up to head with BUE with min guard A for safety                 Toilet Transfer: Min guard;Rolling walker (2 wheels);Ambulation Toilet Transfer Details (indicate cue type and reason): simulated in room         Functional mobility during ADLs: Min guard;Rolling walker (2 wheels)      Extremity/Trunk Assessment Upper Extremity Assessment Upper Extremity Assessment: Generalized weakness;RUE deficits/detail RUE Deficits / Details: decreased strength as compared to L/decreased GM  Lower Extremity Assessment Lower Extremity Assessment: Defer to PT evaluation        Vision   Additional Comments: Decreased R peripheral vision at  baseline. Pt reporting vision has not changed since prior CVA   Perception     Praxis      Cognition Arousal/Alertness: Awake/alert Behavior During Therapy: WFL for tasks assessed/performed Overall Cognitive Status: Difficult to assess Area of Impairment: Safety/judgement, Problem solving, Following commands, Awareness, Orientation                 Orientation Level: Disoriented to, Time     Following Commands: Follows one step commands consistently Safety/Judgement: Decreased awareness of safety, Decreased awareness of deficits Awareness: Emergent Problem Solving: Slow processing, Difficulty sequencing, Requires verbal cues, Requires tactile cues General Comments: Pt with difficulty finding words and occasional difficulty following commands. Improved awareness, as pt reporting "I think I am getting tired, maybe I should take a break" during session. Pt following up to two step commands during session with increased time        Exercises Exercises: Other exercises Other Exercises Other Exercises: cleaning morror in R and L visual fields with R hand and then with L hand. OT placing soap on mirror for pt to clean. pt with increased effort with use of RUE    Shoulder Instructions       General Comments Pt requiring cues for safety with use of RW througout session and to use/not forget RW    Pertinent Vitals/ Pain       Pain Assessment Pain Assessment: No/denies pain  Home Living                                          Prior Functioning/Environment              Frequency  Min 2X/week        Progress Toward Goals  OT Goals(current goals can now be found in the care plan section)  Progress towards OT goals: Progressing toward goals  Acute Rehab OT Goals Patient Stated Goal: get better OT Goal Formulation: With patient Time For Goal Achievement: 06/26/22 Potential to Achieve Goals: Good ADL Goals Pt Will Perform Grooming: with  modified independence;standing Pt Will Perform Lower Body Dressing: with set-up;sit to/from stand Pt Will Transfer to Toilet: with modified independence;ambulating;regular height toilet Pt/caregiver will Perform Home Exercise Program: Increased strength;Right Upper extremity;Left upper extremity;Both right and left upper extremity;With written HEP provided  Plan Discharge plan remains appropriate;Frequency remains appropriate    Co-evaluation                 AM-PAC OT "6 Clicks" Daily Activity     Outcome Measure   Help from another person eating meals?: A Little Help from another person taking care of personal grooming?: A Little Help from another person toileting, which includes using toliet, bedpan, or urinal?: A Little Help from another person bathing (including washing, rinsing, drying)?: A Little Help from another person to put on and taking off regular upper body clothing?: A Little Help from another person to put on and taking off regular lower body clothing?: A Little 6 Click Score: 18    End of Session Equipment Utilized During Treatment: Gait belt;Rolling walker (2 wheels)  OT Visit Diagnosis: Unsteadiness on feet (R26.81);Muscle weakness (generalized) (M62.81);Other symptoms and signs involving cognitive function;Cognitive communication deficit (R41.841)   Activity Tolerance  Patient tolerated treatment well   Patient Left in bed;with bed alarm set;with call bell/phone within reach;with family/visitor present   Nurse Communication Mobility status        Time: 3958-4417 OT Time Calculation (min): 23 min  Charges: OT General Charges $OT Visit: 1 Visit OT Treatments $Self Care/Home Management : 23-37 mins  Elder Cyphers, OTR/L [facility] Acute Rehabilitation Office: [phone]   Magnus Ivan 06/16/2022, 5:50 PM

## 2022-06-16 NOTE — Progress Notes (Signed)
TRH night cross cover note:   I was notified by RN that the patient is complaining of some discomfort in the bilateral lower extremities, refractory to prn acetaminophen, and that the patient is also requesting a sleep aid.  I subsequently placed order for one-time dose of prn Norco as well as placed order for prn melatonin for insomnia.  I communicated this plan to the patient's RN.     Babs Bertin, DO Hospitalist

## 2022-06-16 NOTE — Progress Notes (Signed)
Mobility Specialist: Progress Note   06/16/22 1252  Mobility  Activity Ambulated with assistance in hallway  Level of Assistance Contact guard assist, steadying assist  Assistive Device Front wheel walker  Distance Ambulated (ft) 400 ft  Activity Response Tolerated well  Mobility Referral Yes  $Mobility charge 1 Mobility   Pre-Mobility: 116 HR, 137/79 (97), 97% SpO2  Pt received in the chair and agreeable to mobility. Mod I to stand and contact guard during ambulation. C/o mild dizziness throughout, otherwise asymptomatic. Pt back to bed after session per request with call bell and phone at her side. Bed alarm is on.   [place] Tashunda Vandezande Mobility Specialist Please contact via SecureChat or Rehab office at [phone]

## 2022-06-16 NOTE — Progress Notes (Signed)
Patient refusing to wear her SCD's. Education about the importance of using SCD's to prevent blood cloths formation was given to the patient. Patient expressed understanding but despite the education received, she continues to refused using the SCD's. Patient states that she "will talk to the doctor to tell him that I don't want to use them." Education reinforcement provided, patient kept refusing. Provider notified. Will continue to monitor.

## 2022-06-16 NOTE — Plan of Care (Signed)
  Problem: Education: Goal: Knowledge of secondary prevention will improve (MUST DOCUMENT ALL) Outcome: Progressing   Problem: Coping: Goal: Will verbalize positive feelings about self Outcome: Progressing Goal: Will identify appropriate support needs Outcome: Progressing   Problem: Nutrition: Goal: Dietary intake will improve Outcome: Progressing   Problem: Education: Goal: Knowledge of secondary prevention will improve (MUST DOCUMENT ALL) Outcome: Progressing   Problem: Coping: Goal: Will verbalize positive feelings about self Outcome: Progressing Goal: Will identify appropriate support needs Outcome: Progressing   Problem: Nutrition: Goal: Dietary intake will improve Outcome: Progressing

## 2022-06-16 NOTE — Plan of Care (Signed)
  Problem: Education: Goal: Knowledge of disease or condition will improve Outcome: Progressing Goal: Knowledge of secondary prevention will improve (MUST DOCUMENT ALL) Outcome: Progressing Goal: Knowledge of patient specific risk factors will improve [name] N/A or DELETE if not current risk factor) Outcome: Progressing   Problem: Intracerebral Hemorrhage Tissue Perfusion: Goal: Complications of Intracerebral Hemorrhage will be minimized Outcome: Progressing   Problem: Coping: Goal: Will verbalize positive feelings about self Outcome: Progressing Goal: Will identify appropriate support needs Outcome: Progressing   Problem: Health Behavior/Discharge Planning: Goal: Ability to manage health-related needs will improve Outcome: Progressing Goal: Goals will be collaboratively established with patient/family Outcome: Progressing   Problem: Self-Care: Goal: Ability to participate in self-care as condition permits will improve Outcome: Progressing Goal: Verbalization of feelings and concerns over difficulty with self-care will improve Outcome: Progressing Goal: Ability to communicate needs accurately will improve Outcome: Progressing   Problem: Nutrition: Goal: Risk of aspiration will decrease Outcome: Progressing Goal: Dietary intake will improve Outcome: Progressing   Problem: Education: Goal: Knowledge of General Education information will improve Description: Including pain rating scale, medication(s)/side effects and non-pharmacologic comfort measures Outcome: Progressing   Problem: Health Behavior/Discharge Planning: Goal: Ability to manage health-related needs will improve Outcome: Progressing   Problem: Clinical Measurements: Goal: Ability to maintain clinical measurements within normal limits will improve Outcome: Progressing Goal: Will remain free from infection Outcome: Progressing Goal: Diagnostic test results will improve Outcome: Progressing Goal:  Respiratory complications will improve Outcome: Progressing Goal: Cardiovascular complication will be avoided Outcome: Progressing   Problem: Activity: Goal: Risk for activity intolerance will decrease Outcome: Progressing   Problem: Nutrition: Goal: Adequate nutrition will be maintained Outcome: Progressing   Problem: Coping: Goal: Level of anxiety will decrease Outcome: Progressing   Problem: Elimination: Goal: Will not experience complications related to bowel motility Outcome: Progressing Goal: Will not experience complications related to urinary retention Outcome: Progressing   Problem: Pain Managment: Goal: General experience of comfort will improve Outcome: Progressing   Problem: Safety: Goal: Ability to remain free from injury will improve Outcome: Progressing   Problem: Skin Integrity: Goal: Risk for impaired skin integrity will decrease Outcome: Progressing   Problem: Education: Goal: Ability to describe self-care measures that may prevent or decrease complications (Diabetes Survival Skills Education) will improve Outcome: Progressing Goal: Individualized Educational Video(s) Outcome: Progressing   Problem: Coping: Goal: Ability to adjust to condition or change in health will improve Outcome: Progressing   Problem: Fluid Volume: Goal: Ability to maintain a balanced intake and output will improve Outcome: Progressing   Problem: Health Behavior/Discharge Planning: Goal: Ability to identify and utilize available resources and services will improve Outcome: Progressing Goal: Ability to manage health-related needs will improve Outcome: Progressing   Problem: Metabolic: Goal: Ability to maintain appropriate glucose levels will improve Outcome: Progressing   Problem: Nutritional: Goal: Maintenance of adequate nutrition will improve Outcome: Progressing Goal: Progress toward achieving an optimal weight will improve Outcome: Progressing   Problem:  Skin Integrity: Goal: Risk for impaired skin integrity will decrease Outcome: Progressing   Problem: Tissue Perfusion: Goal: Adequacy of tissue perfusion will improve Outcome: Progressing

## 2022-06-16 NOTE — Progress Notes (Signed)
Patient showing ventricular bigeminy in the monitor, asymptomatic. Provider was notified, no further orders given. Will continue to monitor.

## 2022-06-16 NOTE — [place] FL2 (Signed)
[place] MEDICAID FL2 LEVEL OF CARE FORM     IDENTIFICATION  Patient Name: Lisa Sutton Birthdate: 1933/12/13 Sex: female Admission Date (Current Location): 06/10/2022  [facility] [place] Number:  [employer] and Address:  [facility] [place], [place] [address] [place], [place] [address]      Provider Number: [id number]  Attending Physician Name and Address:  Bonnielee Haff, MD  Relative Name and Phone Number:       Current Level of Care: Hospital Recommended Level of Care: [place] Prior Approval Number:    Date Approved/Denied:   PASRR Number: [id number]  Discharge Plan: SNF    Current Diagnoses: Patient Active Problem List   Diagnosis Date Noted   ICH (intracerebral hemorrhage) ([place]) 06/10/2022   Stupor 06/10/2022   Seizure ([place]) 10/28/2021   Hypertensive emergency    Acute ischemic left MCA stroke ([place]) 03/07/2021   Cerebrovascular accident (CVA) ([place])    Encounter for intubation    Pruritus ani 08/05/2020   History of hepatitis B 02/05/2020   Chronic diarrhea 07/24/2019   Proteinuria 07/16/2019   GAD (generalized anxiety disorder) 03/02/2019   Controlled substance agreement signed 03/02/2019   Hyperlipidemia associated with type 2 diabetes mellitus ([place]) 02/19/2019   CKD stage 3 due to type 2 diabetes mellitus ([place]) 02/19/2019   Primary hypertension 02/19/2019   Elevated alkaline phosphatase level 01/15/2019   Stage 3 chronic kidney disease ([place]) 06/15/2017   History of colonic polyps 05/09/2017   Presbycusis of both ears 06/04/2016   Hypothyroidism due to acquired atrophy of thyroid 04/26/2016   Vitamin D deficiency 04/26/2016   Status post total left knee replacement 09/15/2015   Statin intolerance 09/11/2015   Diabetic neuropathy, type II diabetes mellitus ([place]) 07/30/2015   Abnormal chest x-ray 11/27/2014   Right sided weakness 11/26/2014   Primary insomnia 04/12/2013   GERD (gastroesophageal reflux  disease) 04/25/2012   Fatty liver 08/09/2011   Esophageal dysphagia 08/09/2011   Type 2 diabetes mellitus with diabetic nephropathy, without long-term current use of insulin ([place]) 08/09/2011    Orientation RESPIRATION BLADDER Height & Weight     Self, Time, Situation, Place  Normal Incontinent Weight: 170 lb 10.2 oz (77.4 kg) Height:  '5\' 3"'$  (160 cm)  BEHAVIORAL SYMPTOMS/MOOD NEUROLOGICAL BOWEL NUTRITION STATUS      Continent Diet (low sodium heart healthy)  AMBULATORY STATUS COMMUNICATION OF NEEDS Skin   Limited Assist Verbally Normal                       Personal Care Assistance Level of Assistance  Bathing, Dressing Bathing Assistance: Limited assistance   Dressing Assistance: Limited assistance     Functional Limitations Info  Hearing   Hearing Info: Impaired      SPECIAL CARE FACTORS FREQUENCY  PT (By licensed PT), OT (By licensed OT)     PT Frequency: 5x/wk OT Frequency: 5x/wk            Contractures Contractures Info: Not present    Additional Factors Info  Code Status, Allergies, Insulin Sliding Scale Code Status Info: Full Allergies Info: Statins, Empagliflozin, Other, Zetia (Ezetimibe), Zocor (Simvastatin)   Insulin Sliding Scale Info: see DC summary       Current Medications (06/16/2022):  This is the current hospital active medication list Current Facility-Administered Medications  Medication Dose Route Frequency Provider Last Rate Last Admin   acetaminophen (TYLENOL) tablet 650 mg  650 mg Oral Q4H PRN Lovey Newcomer  C, NP   650 mg at 06/15/22 2146   Or   acetaminophen (TYLENOL) 160 MG/5ML solution 650 mg  650 mg Per Tube Q4H PRN Lovey Newcomer C, NP       Or   acetaminophen (TYLENOL) suppository 650 mg  650 mg Rectal Q4H PRN Otelia Santee, NP       aspirin EC tablet 81 mg  81 mg Oral Daily Rosalin Hawking, MD   81 mg at 06/16/22 0953   cholecalciferol (VITAMIN D3) 25 MCG (1000 UNIT) tablet 1,000 Units  1,000 Units Oral Daily Rosalin Hawking, MD    1,000 Units at 06/16/22 0953   insulin aspart (novoLOG) injection 0-6 Units  0-6 Units Subcutaneous TID WC de Yolanda Manges, Cortney E, NP   1 Units at 06/16/22 1152   levETIRAcetam (KEPPRA) tablet 250 mg  250 mg Oral BID Rosalin Hawking, MD   250 mg at 06/16/22 0177   levothyroxine (SYNTHROID) tablet 125 mcg  125 mcg Oral QAC breakfast Rosalin Hawking, MD   125 mcg at 06/16/22 9390   Oral care mouth rinse  15 mL Mouth Rinse 4 times per day Derek Jack, MD   15 mL at 06/16/22 1200   Oral care mouth rinse  15 mL Mouth Rinse PRN Derek Jack, MD       pantoprazole (PROTONIX) EC tablet 40 mg  40 mg Oral Daily Rosalin Hawking, MD   40 mg at 06/16/22 3009   rosuvastatin (CRESTOR) tablet 20 mg  20 mg Oral Jackelyn Poling, MD   20 mg at 06/15/22 2147     Discharge Medications: Please see discharge summary for a list of discharge medications.  Relevant Imaging Results:  Relevant Lab Results:   Additional Information SS#: [id number]  Geralynn Ochs, LCSW

## 2022-06-16 NOTE — Discharge Summary (Addendum)
[facility]  Physician Discharge Summary   Patient ID: Lisa Sutton MRN: [id number] DOB/AGE: 1933-12-28 89 y.o.  Admit date: 06/10/2022 Discharge date:   06/16/2022   PCP: Adaline Sill, NP  DISCHARGE DIAGNOSES:    ICH (intracerebral hemorrhage) ([place])   Seizure ([place])   RECOMMENDATIONS FOR OUTPATIENT FOLLOW UP: Check CBC and basic metabolic panel in 1 week Ambulatory referral sent to neurology for follow-up  Home Health: Going to SNF Equipment/Devices: None  CODE STATUS: Full code  DISCHARGE CONDITION: fair  Diet recommendation: Heart healthy  INITIAL HISTORY: 89 y.o. female with history of seizures, hypertension, hyperlipidemia, diabetes and left MCA stroke presenting after she had an unwitnessed fall with acute onset altered mental status and worsening right-sided weakness.  She does have a history of seizures and may have had a seizure prior to the unwitnessed fall, as she had her normal seizure prodrome of right-sided pain and sensory deficit before falling.  CT head shows small ICH along left temporal cortex.    Consultants: Neurology   Procedures: Echocardiogram   HOSPITAL COURSE:   Intracranial hemorrhage Found to have a small intracranial hemorrhage in the left hemisphere.  Seen by neurology.  Was on aspirin and Plavix prior to admission both of which were placed on hold. Echocardiogram showed EF of 55 to 60% with grade 1 diastolic dysfunction. LDL 30.  HbA1c 7.2. CT head repeated by neurology.  Thought to be stable.  Aspirin resumed per neurology. Seen by physical therapy.  Skilled nursing facility is recommended for short-term rehab.    Seizure disorder Continue Keppra.  Long-term monitoring did not show any seizure activity. Stable from a neurological standpoint.   Essential hypertension Goal blood pressure is less than 235 systolic.  May resume amlodipine at discharge.  Will continue to hold ARB due to CKD. Ventricular bigeminy noted  on telemetry.  Patient is asymptomatic.   Chronic kidney disease stage IV/hyperkalemia Baseline creatinine around 2.0.  Presented with creatinine of 2.4.  Might have a new baseline as creatinine is around 2.3.  Monitor urine output.  Avoid nephrotoxic agents.  May not be able to resume ARB with this level of creatinine.   Hyperlipidemia Continue statin.   Diabetes mellitus type 2, uncontrolled with hyperglycemia HbA1c 7.2.  May resume home medications.  Monitor renal function closely.  Consider discontinuing metformin if creatinine climbs to greater than 2.5.   Normocytic anemia No overt bleeding apart from intracranial hemorrhage.  Hemoglobin has been stable.   Obesity Estimated body mass index is 30.23 kg/m as calculated from the following:   Height as of this encounter: '5\' 3"'$  (1.6 m).   Weight as of this encounter: 77.4 kg.  Patient's leg pain is much better.  Sinus tachycardia is improved now occasional ventricular bigeminy noted.  Ambulated with nursing staff yesterday.  Okay for discharge to SNF.  Still waiting on bed availability.    PERTINENT LABS:  The results of significant diagnostics from this hospitalization (including imaging, microbiology, ancillary and laboratory) are listed below for reference.    Microbiology: Recent Results (from the past 240 hour(s))  MRSA Next Gen by PCR, Nasal     Status: None   Collection Time: 06/10/22  6:55 PM   Specimen: Nasal Mucosa; Nasal Swab  Result Value Ref Range Status   MRSA by PCR Next Gen NOT DETECTED NOT DETECTED Final    Comment: (NOTE) The GeneXpert MRSA Assay (FDA approved for NASAL specimens only), is one component of a comprehensive MRSA colonization  surveillance program. It is not intended to diagnose MRSA infection nor to guide or monitor treatment for MRSA infections. Test performance is not FDA approved in patients less than 93 years old. Performed at [facility], [place] [address] [place],  [place] [address]      Labs:   Basic Metabolic Panel: Recent Labs  Lab 06/11/22 1112 06/11/22 1820 06/12/22 0150 06/13/22 0348 06/16/22 0655  NA 137 137 138 137 138  K 5.4* 4.8 4.7 4.4 4.9  CL 110 108 110 106 106  CO2 20* 19* 19* 22 21*  GLUCOSE 111* 164* 115* 153* 154*  BUN 26* 26* 25* 31* 42*  CREATININE 2.12* 2.04* 1.98* 2.12* 2.32*  CALCIUM 8.6* 8.7* 8.5* 9.1 8.8*   Liver Function Tests: Recent Labs  Lab 06/10/22 1000 06/10/22 1756  AST 23 28  ALT 14 16  ALKPHOS 77 75  BILITOT 0.7 0.9  PROT 6.3* 6.0*  ALBUMIN 3.4* 3.4*    CBC: Recent Labs  Lab 06/10/22 1000 06/11/22 0716 06/12/22 0150 06/13/22 0348 06/16/22 0655  WBC 8.9 6.8 7.4 6.7 6.7  NEUTROABS 5.8 4.3  --   --   --   HGB 12.1  12.6 11.8* 10.9* 11.0* 12.0  HCT 38.1  37.0 36.8 34.0* 33.3* 38.1  MCV 87.8 86.8 86.3 85.4 86.2  PLT 234 226 219 208 211     CBG: Recent Labs  Lab 06/15/22 1006 06/15/22 1236 06/15/22 1708 06/15/22 2104 06/16/22 0616  GLUCAP 295* 219* 145* 143* 52     IMAGING STUDIES CT HEAD WO CONTRAST (5MM)  Result Date: 06/13/2022 CLINICAL DATA:  Hemorrhagic stroke follow-up EXAM: CT HEAD WITHOUT CONTRAST TECHNIQUE: Contiguous axial images were obtained from the base of the skull through the vertex without intravenous contrast. RADIATION DOSE REDUCTION: This exam was performed according to the departmental dose-optimization program which includes automated exposure control, adjustment of the mA and/or kV according to patient size and/or use of iterative reconstruction technique. COMPARISON:  Brain MRI from 2 days ago FINDINGS: Brain: No change in the 4 mm cortical high-density along the superficial left temporal cortex. 4 mm thick subdural collection along the left cerebral convexity at the vertex, low-density. This was not seen on prior studies, see coronal reformats. Left cortical and white matter infarct centered at the parietal lobe. Extensive chronic small vessel ischemia in the  cerebral white matter. Chronic perforator infarct at the right basal ganglia. Generalized cerebral volume loss that is mild for age Vascular: No hyperdense vessel or unexpected calcification. Skull: Normal. Negative for fracture or focal lesion. Sinuses/Orbits: No acute finding. IMPRESSION: A small hygroma has developed along the left cerebral convexity measuring up to 4 mm in thickness. No change in the punctate hemorrhage at the left temporal lobe. Electronically Signed   By: Jorje Guild M.D.   On: 06/13/2022 07:12   MR BRAIN W WO CONTRAST  Result Date: 06/11/2022 CLINICAL DATA:  History of seizures and remote left MCA stroke presents with altered mental status and worsening right-sided weakness. EXAM: MRI HEAD WITHOUT AND WITH CONTRAST TECHNIQUE: Multiplanar, multiecho pulse sequences of the brain and surrounding structures were obtained without and with intravenous contrast. CONTRAST:  7.17m GADAVIST GADOBUTROL 1 MMOL/ML IV SOLN COMPARISON:  CT/CTA head and neck 1 day prior FINDINGS: Brain: There is no evidence of acute infarct There is a small focus of SWI signal dropout with associated FLAIR signal abnormality in the left anterior temporal lobe corresponding to the hyperdensity seen on prior CT consistent with  a small focus of hemorrhage measuring approximately 4 mm. There is no significant mass effect. There is no associated enhancement. There is no other acute intracranial hemorrhage. Parenchymal volume is stable. The ventricles are stable in size. The remote infarct in the left parietal lobe in the MCA distribution with associated encephalomalacia, gliosis, and cortical laminar necrosis is unchanged. There is unchanged ex vacuo dilatation of the right lateral ventricle. Additional remote infarcts in the right corona radiata and background advanced chronic small-vessel ischemic change are stable. The hippocampi are unremarkable. The pituitary and suprasellar region are normal. There is no mass lesion  or abnormal enhancement. There is no mass effect or midline shift. Vascular: Normal flow voids. Skull and upper cervical spine: Normal marrow signal. Sinuses/Orbits: There is mucosal thickening in the right maxillary sinus. Bilateral lens implants are in place. The globes and orbits are otherwise unremarkable. There are bilateral mastoid effusions, nonspecific and also present on the prior study from 2023. Other: None. IMPRESSION: 1. Small focus of hemorrhage in the left anterior temporal lobe is similar to the prior CT, allowing for differences in modality. No significant mass effect. 2. No other acute intracranial pathology. Electronically Signed   By: Valetta Mole M.D.   On: 06/11/2022 15:18   Overnight EEG with video  Result Date: 06/11/2022 Lora Havens, MD     06/11/2022 11:29 AM Patient Name: LAKAYA TOLEN MRN: [id number] Epilepsy Attending: Lora Havens Referring Physician/Provider: Otelia Santee, NP Duration: 06/10/2022 1053 to 06/11/2022 1122 Patient history: 89 year old female with right-sided weakness, altered mental status.  EEG to evaluate for seizure. Level of alertness: Awake, asleep AEDs during EEG study: LEV Technical aspects: This EEG study was done with scalp electrodes positioned according to the 10-20 International system of electrode placement. Electrical activity was reviewed with band pass filter of 1-'70Hz'$ , sensitivity of 7 uV/mm, display speed of 74m/sec with a '60Hz'$  notched filter applied as appropriate. EEG data were recorded continuously and digitally stored.  Video monitoring was available and reviewed as appropriate. Description: The posterior dominant rhythm consists of 9-10 Hz activity of moderate voltage (25-35 uV) seen predominantly in posterior head regions, symmetric and reactive to eye opening and eye closing. Sleep was characterized by vertex waves, sleep spindles (12 to 14 Hz), maximal frontocentral region. EEG showed intermittent 3-'5Hz'$  theta-delta slowing in  left frontotemporal region. Hyperventilation and photic stimulation were not performed.   ABNORMALITY -Intermittent slow, left frontotemporal region. IMPRESSION: This study is suggestive of cortical dysfunction arising from left frontotemporal region likely secondary to underlying structural abnormality/ ICH. No seizures or definite epileptiform discharges were seen throughout the recording. PLora Havens  CT HEAD WO CONTRAST  Result Date: 06/10/2022 CLINICAL DATA:  Stroke, follow up EXAM: CT HEAD WITHOUT CONTRAST TECHNIQUE: Contiguous axial images were obtained from the base of the skull through the vertex without intravenous contrast. RADIATION DOSE REDUCTION: This exam was performed according to the departmental dose-optimization program which includes automated exposure control, adjustment of the mA and/or kV according to patient size and/or use of iterative reconstruction technique. COMPARISON:  CT head 06/10/2022 FINDINGS: Brain: Patchy and confluent areas of decreased attenuation are noted throughout the deep and periventricular white matter of the cerebral hemispheres bilaterally, compatible with chronic microvascular ischemic disease. Chronic left parietooccipital and right corona radiata infarction. No evidence of large-territorial acute infarction. Persistent slightly less conspicuous a stable in size hyperdensity measuring approximately 4 mm along the left lateral temporal lobe (3:12). No extra-axial acute fluid. Slight enhancing  vasculature due to prior CT angiography head neck 06/10/2022. No mass lesion. No extra-axial collection. No mass effect or midline shift. No hydrocephalus. Basilar cisterns are patent. Vascular: No hyperdense vessel. Atherosclerotic calcifications are present within the cavernous internal carotid and vertebral arteries. Skull: No acute fracture or focal lesion. Sinuses/Orbits: Paranasal sinuses and mastoid air cells are clear. The orbits are unremarkable. Other: None.  IMPRESSION: No change with persistent slightly less conspicuous a stable in size hyperdensity measuring approximately 4 mm along the left lateral temporal lobe. Electronically Signed   By: Iven Finn M.D.   On: 06/10/2022 20:18   ECHOCARDIOGRAM COMPLETE  Result Date: 06/10/2022    ECHOCARDIOGRAM REPORT   Patient Name:   TAMEA BAI Date of Exam: 06/10/2022 Medical Rec #:  [id number]         Height:       63.0 in Accession #:    [id number]        Weight:       173.3 lb Date of Birth:  12/16/1933         BSA:          1.819 m Patient Age:    89 years          BP:           107/76 mmHg Patient Gender: F                 HR:           79 bpm. Exam Location:  Inpatient Procedure: 2D Echo, Cardiac Doppler and Color Doppler Indications:    Stroke I63.9  History:        Patient has prior history of Echocardiogram examinations, most                 recent 03/08/2021. Stroke, Signs/Symptoms:Altered Mental Status;                 Risk Factors:Diabetes, Dyslipidemia and Hypertension.  Sonographer:    Greer Pickerel Referring Phys: [id number] Otelia Santee  Sonographer Comments: Technically difficult study due to poor echo windows. Image acquisition challenging due to patient body habitus and Image acquisition challenging due to respiratory motion. IMPRESSIONS  1. Challenging windows. Left ventricular ejection fraction, by estimation, is 55 to 60%. The left ventricle has normal function. The left ventricle has no regional wall motion abnormalities. There is mild left ventricular hypertrophy. Left ventricular diastolic parameters are consistent with Grade I diastolic dysfunction (impaired relaxation).  2. Right ventricular systolic function is normal. The right ventricular size is normal.  3. Left atrial size was mild to moderately dilated.  4. No evidence of mitral valve regurgitation. Moderate mitral annular calcification.  5. The aortic valve was not well visualized. Aortic valve regurgitation is not visualized.  6. The  inferior vena cava is normal in size with greater than 50% respiratory variability, suggesting right atrial pressure of 3 mmHg. Comparison(s): No significant change from prior study. FINDINGS  Left Ventricle: Challenging windows. Left ventricular ejection fraction, by estimation, is 55 to 60%. The left ventricle has normal function. The left ventricle has no regional wall motion abnormalities. The left ventricular internal cavity size was normal in size. There is mild left ventricular hypertrophy. Left ventricular diastolic parameters are consistent with Grade I diastolic dysfunction (impaired relaxation). Right Ventricle: The right ventricular size is normal. Right ventricular systolic function is normal. Left Atrium: Left atrial size was mild to moderately dilated. Right Atrium: Right atrial size was normal in  size. Pericardium: There is no evidence of pericardial effusion. Mitral Valve: Moderate mitral annular calcification. No evidence of mitral valve regurgitation. Tricuspid Valve: Tricuspid valve regurgitation is not demonstrated. Aortic Valve: The aortic valve was not well visualized. Aortic valve regurgitation is not visualized. Pulmonic Valve: The pulmonic valve was not well visualized. Aorta: The aortic root and ascending aorta are structurally normal, with no evidence of dilitation. Venous: The inferior vena cava is normal in size with greater than 50% respiratory variability, suggesting right atrial pressure of 3 mmHg. IAS/Shunts: The interatrial septum was not well visualized.  LEFT VENTRICLE PLAX 2D LVOT diam:     1.50 cm     Diastology LV SV:         39          LV e' medial:    6.68 cm/s LV SV Index:   21          LV E/e' medial:  8.9 LVOT Area:     1.77 cm    LV e' lateral:   5.75 cm/s                            LV E/e' lateral: 10.3  LV Volumes (MOD) LV vol d, MOD A2C: 40.5 ml LV vol d, MOD A4C: 87.8 ml LV vol s, MOD A2C: 29.7 ml LV vol s, MOD A4C: 45.4 ml LV SV MOD A2C:     10.8 ml LV SV MOD A4C:      87.8 ml LV SV MOD BP:      23.4 ml RIGHT VENTRICLE RV S prime:     11.80 cm/s TAPSE (M-mode): 2.2 cm LEFT ATRIUM             Index        RIGHT ATRIUM           Index LA Vol (A2C):   76.6 ml 42.10 ml/m  RA Area:     19.50 cm LA Vol (A4C):   74.2 ml 40.78 ml/m  RA Volume:   53.30 ml  29.30 ml/m LA Biplane Vol: 80.7 ml 44.36 ml/m  AORTIC VALVE LVOT Vmax:   116.50 cm/s LVOT Vmean:  76.800 cm/s LVOT VTI:    0.218 m  AORTA Ao Root diam: 3.20 cm MITRAL VALVE MV Area (PHT): 3.72 cm     SHUNTS MV Decel Time: 204 msec     Systemic VTI:  0.22 m MV E velocity: 59.20 cm/s   Systemic Diam: 1.50 cm MV A velocity: 112.00 cm/s MV E/A ratio:  0.53 Landscape architect signed by Phineas Inches Signature Date/Time: 06/10/2022/2:55:13 PM    Final    CT ANGIO HEAD NECK W WO CM (CODE STROKE)  Result Date: 06/10/2022 CLINICAL DATA:  Provided history: Neuro deficit, acute, stroke suspected. Acute on chronic right-sided weakness, aphasia, unresponsiveness, possible seizure, chronic left MCA stroke. EXAM: CT ANGIOGRAPHY HEAD AND NECK TECHNIQUE: Multidetector CT imaging of the head and neck was performed using the standard protocol during bolus administration of intravenous contrast. Multiplanar CT image reconstructions and MIPs were obtained to evaluate the vascular anatomy. Carotid stenosis measurements (when applicable) are obtained utilizing NASCET criteria, using the distal internal carotid diameter as the denominator. RADIATION DOSE REDUCTION: This exam was performed according to the departmental dose-optimization program which includes automated exposure control, adjustment of the mA and/or kV according to patient size and/or use of iterative reconstruction technique. CONTRAST:  11m OMNIPAQUE IOHEXOL 350 MG/ML  SOLN COMPARISON:  Contrast head CT performed earlier the same day 06/10/2022. CT angiogram head/neck 10/28/2021. FINDINGS: CTA NECK FINDINGS Aortic arch: Standard aortic branching. Atherosclerotic plaque within  the visualized aortic arch and proximal major branch vessels of the neck., 6 Streak and beam hardening artifact arising from a dense left-sided contrast bolus partially obscures the left subclavian artery. Within this limitation, there is no appreciable hemodynamically significant innominate or proximal subclavian artery stenosis. Right carotid system: CCA and ICA patent within the neck without stenosis or significant atherosclerotic disease. Left carotid system: CCA and ICA patent within the neck without stenosis. Mild atherosclerotic plaque about the carotid bifurcation. Vertebral arteries: Vertebral arteries codominant and patent within the neck. Redemonstrated moderate to severe atherosclerotic narrowing at the origin of the left vertebral artery. Skeleton: Cervical spondylosis. Congenital nonunion of the posterior arch of C1. No acute fracture or aggressive osseous lesion. Other neck: No neck mass or cervical lymphadenopathy. Upper chest: No consolidation within the imaged lung apices. Review of the MIP images confirms the above findings CTA HEAD FINDINGS Anterior circulation: The intracranial internal carotid arteries are patent. Atherosclerotic plaque within the intracranial right ICA with no more than mild stenosis. Mild atherosclerotic stenosis within the left cavernous segment. Up to moderate atherosclerotic narrowing within the paraclinoid left ICA. The M1 middle cerebral arteries are patent. Atherosclerotic irregularity of the M2 and more distal MCA vessels, bilaterally. Most notably, there is an unchanged moderate/severe focal stenosis within a mid M2 left MCA vessel (series 11, image 13). The anterior cerebral arteries are patent. Mild atherosclerotic irregularity of both vessels. No intracranial aneurysm is identified. Posterior circulation: The intracranial vertebral arteries are patent. The basilar artery is patent. The posterior cerebral arteries are patent. Atherosclerotic irregularity of both  vessels, most notably as follows. New severe stenosis within the left PCA P1 segment. Redemonstrated mild-to-moderate focal stenosis within the left PCA P2 segment. Redemonstrated moderate/severe stenosis within the right PCA P2 segment. Small posterior communicating arteries are present, bilaterally. Venous sinuses: Within the limitations of contrast timing, no convincing thrombus. Anatomic variants: As described. Review of the MIP images confirms the above findings CTA head impressions #1 and #3 called by telephone at the time of interpretation on 06/10/2022 at 10:35 am to provider [facility] , who verbally acknowledged these results. IMPRESSION: CTA neck: 1. The common carotid and internal carotid arteries are patent within the neck without stenosis. Mild atherosclerotic plaque about the left carotid bifurcation. 2. The vertebral arteries are patent within the neck. Redemonstrated moderate-to-severe atherosclerotic stenosis at the origin of the left vertebral artery. CTA head: 1. No intracranial large vessel occlusion is identified. 2. Intracranial atherosclerotic disease, as outlined and most notably as follows. 3. Severe stenosis within the left PCA P1 segment, new from the prior CTA of 10/28/2021. 4. Redemonstrated moderate/severe stenosis within the right PCA P2 segment. 5. Redemonstrated moderate/severe stenosis within a mid M2 left MCA vessel. Electronically Signed   By: Kellie Simmering D.O.   On: 06/10/2022 10:35   CT HEAD CODE STROKE WO CONTRAST  Result Date: 06/10/2022 CLINICAL DATA:  Code stroke.  Right handed weakness EXAM: CT HEAD WITHOUT CONTRAST TECHNIQUE: Contiguous axial images were obtained from the base of the skull through the vertex without intravenous contrast. RADIATION DOSE REDUCTION: This exam was performed according to the departmental dose-optimization program which includes automated exposure control, adjustment of the mA and/or kV according to patient size and/or use of iterative  reconstruction technique. COMPARISON:  10/28/2021 FINDINGS: Brain: New small hyperdensity along  the superficial cortex of the lateral left temporal lobe measuring 4 mm. Chronic left parietooccipital cortically based infarct. Chronic perforator infarct at the right corona radiata. No hydrocephalus or masslike finding. Generalized cerebral volume loss and extensive chronic small vessel ischemia. Vascular: No hyperdense vessel or unexpected calcification. Skull: Normal. Negative for fracture or focal lesion. Sinuses/Orbits: No acute finding. Other: These results were called by telephone at the time of interpretation on 06/10/2022 at 10:03 am to provider [facility] , who verbally acknowledged these results. IMPRESSION: 1. New 4 mm high-density along the left temporal cortex suggesting acute hemorrhage. No acute infarct noted. 2. Chronic left parieto-occipital and right corona radiata infarcts. Electronically Signed   By: Jorje Guild M.D.   On: 06/10/2022 10:10    DISCHARGE EXAMINATION: Vitals:   06/15/22 1710 06/15/22 1951 06/15/22 2349 06/16/22 0347  BP: (!) 141/86 (!) 129/91 (!) 93/54 132/88  Pulse: 89 81 72 78  Resp: '20 17 17 17  '$ Temp: 97.6 F (36.4 C) (!) 97.5 F (36.4 C) 97.8 F (36.6 C) 98.1 F (36.7 C)  TempSrc: Oral Oral Oral Oral  SpO2: 99% 95% 95% 99%  Weight:      Height:       General appearance: Awake alert.  In no distress Resp: Clear to auscultation bilaterally.  Normal effort Cardio: S1-S2 is normal regular.  No S3-S4.  No rubs murmurs or bruit GI: Abdomen is soft.  Nontender nondistended.  Bowel sounds are present normal.  No masses organomegaly Extremities: No edema.  No erythema over the right lower extremity.  Good peripheral pulses.   DISPOSITION: SNF  Discharge Instructions     Ambulatory referral to Neurology   Complete by: As directed    Follow up with Dr. Leta Baptist at [facility] in 4-6 weeks.  Patient is Dr. Gladstone Lighter patient. Thanks.   Call MD for:  difficulty  breathing, headache or visual disturbances   Complete by: As directed    Call MD for:  hives   Complete by: As directed    Call MD for:  persistant dizziness or light-headedness   Complete by: As directed    Call MD for:  persistant nausea and vomiting   Complete by: As directed    Call MD for:  severe uncontrolled pain   Complete by: As directed    Call MD for:  temperature >100.4   Complete by: As directed    Diet - low sodium heart healthy   Complete by: As directed    Discharge instructions   Complete by: As directed    Please review instructions on the discharge summary  You were cared for by a hospitalist during your hospital stay. If you have any questions about your discharge medications or the care you received while you were in the hospital after you are discharged, you can call the unit and asked to speak with the hospitalist on call if the hospitalist that took care of you is not available. Once you are discharged, your primary care physician will handle any further medical issues. Please note that NO REFILLS for any discharge medications will be authorized once you are discharged, as it is imperative that you return to your primary care physician (or establish a relationship with a primary care physician if you do not have one) for your aftercare needs so that they can reassess your need for medications and monitor your lab values. If you do not have a primary care physician, you can call [phone] for a physician referral.  Increase activity slowly   Complete by: As directed           Allergies as of 06/16/2022       Reactions   Statins Other (See Comments)   'discomfort, aching everywhere' Tolerates to livalo    Empagliflozin Other (See Comments)   Other    Allergic to diabetic medication but unsure of name. Went to ED February 2023 (ED note I do not see name of med)    Zetia [ezetimibe] Other (See Comments)   Leg pain   Zocor [simvastatin] Other (See Comments)    Leg pain        Medication List     STOP taking these medications    clopidogrel 75 MG tablet Commonly known as: Plavix   diphenhydrAMINE 25 MG tablet Commonly known as: BENADRYL   hydrOXYzine 50 MG tablet Commonly known as: ATARAX   losartan 100 MG tablet Commonly known as: COZAAR       TAKE these medications    amLODipine 10 MG tablet Commonly known as: NORVASC Take 10 mg by mouth daily.   aspirin EC 81 MG tablet Take 81 mg by mouth daily. Swallow whole.   cholecalciferol 25 MCG (1000 UNIT) tablet Commonly known as: VITAMIN D3 Take 1,000 Units by mouth daily.   glipiZIDE 5 MG tablet Commonly known as: GLUCOTROL Take 5 mg by mouth every morning.   Jardiance 10 MG Tabs tablet Generic drug: empagliflozin Take 10 mg by mouth daily.   levETIRAcetam 250 MG tablet Commonly known as: KEPPRA Take 1 tablet (250 mg total) by mouth 2 (two) times daily.   levothyroxine 125 MCG tablet Commonly known as: SYNTHROID Take 1 tablet (125 mcg total) by mouth daily before breakfast.   meclizine 25 MG tablet Commonly known as: ANTIVERT Take 25 mg by mouth 2 (two) times daily as needed for dizziness.   metFORMIN 500 MG 24 hr tablet Commonly known as: GLUCOPHAGE-XR Take 500 mg by mouth daily.   oxyCODONE 5 MG immediate release tablet Commonly known as: Roxicodone Take 0.5 tablets (2.5 mg total) by mouth 2 (two) times daily as needed for severe pain.   pantoprazole 40 MG tablet Commonly known as: PROTONIX Take 1 tablet (40 mg total) by mouth daily before lunch.   rosuvastatin 40 MG tablet Commonly known as: CRESTOR Take 40 mg by mouth at bedtime.   triamcinolone cream 0.1 % Commonly known as: KENALOG Apply 1 application. topically daily as needed (irritation).   Valtoco 5 MG Dose 5 MG/0.1ML Liqd Generic drug: diazePAM Spray in nose if seizure last more than 5 minutes. Can repeat dose in 4 hours. Do not use more than twice in 24 hours.          Follow-up  Information     Penumalli, Earlean Polka, MD. Schedule an appointment as soon as possible for a visit in 1 month(s).   Specialties: Neurology, Radiology Contact information: [address] [place] [address] [phone]                 TOTAL DISCHARGE TIME: 35 minutes  [place]  [facility] Pager on [email or web address]  06/16/2022, 9:10 AM

## 2022-06-17 LAB — GLUCOSE, CAPILLARY
Glucose-Capillary: 147 mg/dL — ABNORMAL HIGH (ref 70–99)
Glucose-Capillary: 153 mg/dL — ABNORMAL HIGH (ref 70–99)
Glucose-Capillary: 203 mg/dL — ABNORMAL HIGH (ref 70–99)
Glucose-Capillary: 134 mg/dL — ABNORMAL HIGH (ref 70–99)

## 2022-06-17 MED ORDER — OXYCODONE-ACETAMINOPHEN 5-325 MG PO TABS
0.5000 | ORAL_TABLET | Freq: Three times a day (TID) | ORAL | Status: DC | PRN
  Administered 2022-06-17: 0.5 via ORAL
  Filled 2022-06-17: qty 1

## 2022-06-17 NOTE — TOC Progression Note (Signed)
Transition of Care [facility]) - Progression Note    Patient Details  Name: Lisa Sutton MRN: [id number] Date of Birth: May 17, 1934  Transition of Care [facility] CM/SW [place], [place] Phone Number: 06/17/2022, 2:37 PM  Clinical Narrative:   CSW contacted [place] to ask who else could be contacted since Admissions wasn't returning call. Secretary told CSW to just fax the referral. CSW faxed referral on patient and received a call back that they can accept the patient, bed will be available on Friday. CSW initiated insurance authorization request, it is still pending at this time. CSW to follow.    Expected Discharge Plan: [place] Barriers to Discharge: Continued Medical Work up, Ship broker  Expected Discharge Plan and [place] Choice: [place] Living arrangements for the past 2 months: Single Family Home Expected Discharge Date: 06/17/22                                     Social Determinants of Health (SDOH) Interventions SDOH Screenings   Food Insecurity: No Food Insecurity (06/10/2022)  Housing: Low Risk  (06/10/2022)  Transportation Needs: No Transportation Needs (06/10/2022)  Utilities: Not At Risk (06/10/2022)  Alcohol Screen: Low Risk  (09/05/2019)  Depression (PHQ2-9): Low Risk  (07/16/2019)  Financial Resource Strain: Low Risk  (09/05/2019)  Physical Activity: Sufficiently Active (09/05/2019)  Social Connections: Moderately Integrated (09/05/2019)  Stress: No Stress Concern Present (09/05/2019)  Tobacco Use: Low Risk  (06/14/2022)    Readmission Risk Interventions     No data to display

## 2022-06-17 NOTE — Discharge Summary (Signed)
[facility]  Physician Discharge Summary   Patient ID: Lisa Sutton MRN: [id number] DOB/AGE: 1933/05/31 89 y.o.  Admit date: 06/10/2022 Discharge date:   06/17/2022   PCP: Adaline Sill, NP  DISCHARGE DIAGNOSES:    ICH (intracerebral hemorrhage) ([place])   Seizure ([place])   RECOMMENDATIONS FOR OUTPATIENT FOLLOW UP: Check CBC and basic metabolic panel in 1 week Ambulatory referral sent to neurology for follow-up  Home Health: Going to SNF Equipment/Devices: None  CODE STATUS: Full code  DISCHARGE CONDITION: fair  Diet recommendation: Heart healthy  INITIAL HISTORY: 89 y.o. female with history of seizures, hypertension, hyperlipidemia, diabetes and left MCA stroke presenting after she had an unwitnessed fall with acute onset altered mental status and worsening right-sided weakness.  She does have a history of seizures and may have had a seizure prior to the unwitnessed fall, as she had her normal seizure prodrome of right-sided pain and sensory deficit before falling.  CT head shows small ICH along left temporal cortex.    Consultants: Neurology   Procedures: Echocardiogram   HOSPITAL COURSE:   Intracranial hemorrhage Found to have a small intracranial hemorrhage in the left hemisphere.  Seen by neurology.  Was on aspirin and Plavix prior to admission both of which were placed on hold. Echocardiogram showed EF of 55 to 60% with grade 1 diastolic dysfunction. LDL 30.  HbA1c 7.2. CT head repeated by neurology.  Thought to be stable.  Aspirin resumed per neurology. Seen by physical therapy.  Skilled nursing facility is recommended for short-term rehab.    Seizure disorder Continue Keppra.  Long-term monitoring did not show any seizure activity. Stable from a neurological standpoint.   Essential hypertension Goal blood pressure is less than 782 systolic.  May resume amlodipine at discharge.  Will continue to hold ARB due to CKD. Ventricular bigeminy noted  on telemetry.  Patient is asymptomatic.   Chronic kidney disease stage IV/hyperkalemia Baseline creatinine around 2.0.  Presented with creatinine of 2.4.  Might have a new baseline as creatinine is around 2.3.  Monitor urine output.  Avoid nephrotoxic agents.  May not be able to resume ARB with this level of creatinine.   Hyperlipidemia Continue statin.   Diabetes mellitus type 2, uncontrolled with hyperglycemia HbA1c 7.2.  May resume home medications.  Monitor renal function closely.  Consider discontinuing metformin if creatinine climbs to greater than 2.5.   Normocytic anemia No overt bleeding apart from intracranial hemorrhage.  Hemoglobin has been stable.  Peripheral neuropathy Patient with neuropathic pain in the lower extremities.  Pulses are palpable.  No erythema noted.  No swelling.  No vascular concern at this time.  Pain medications as needed.  Could consider gabapentin or pregabalin depending on her progress over the next few days.   Obesity Estimated body mass index is 30.23 kg/m as calculated from the following:   Height as of this encounter: '5\' 3"'$  (1.6 m).   Weight as of this encounter: 77.4 kg.  Patient is stable.  Remains stable for discharge to SNF.    PERTINENT LABS:  The results of significant diagnostics from this hospitalization (including imaging, microbiology, ancillary and laboratory) are listed below for reference.    Microbiology: Recent Results (from the past 240 hour(s))  MRSA Next Gen by PCR, Nasal     Status: None   Collection Time: 06/10/22  6:55 PM   Specimen: Nasal Mucosa; Nasal Swab  Result Value Ref Range Status   MRSA by PCR Next Gen NOT DETECTED NOT  DETECTED Final    Comment: (NOTE) The GeneXpert MRSA Assay (FDA approved for NASAL specimens only), is one component of a comprehensive MRSA colonization surveillance program. It is not intended to diagnose MRSA infection nor to guide or monitor treatment for MRSA infections. Test  performance is not FDA approved in patients less than 67 years old. Performed at [facility], [place] [address], [place], [place] [address]      Labs:   Basic Metabolic Panel: Recent Labs  Lab 06/11/22 1112 06/11/22 1820 06/12/22 0150 06/13/22 0348 06/16/22 0655  NA 137 137 138 137 138  K 5.4* 4.8 4.7 4.4 4.9  CL 110 108 110 106 106  CO2 20* 19* 19* 22 21*  GLUCOSE 111* 164* 115* 153* 154*  BUN 26* 26* 25* 31* 42*  CREATININE 2.12* 2.04* 1.98* 2.12* 2.32*  CALCIUM 8.6* 8.7* 8.5* 9.1 8.8*   Liver Function Tests: Recent Labs  Lab 06/10/22 1000 06/10/22 1756  AST 23 28  ALT 14 16  ALKPHOS 77 75  BILITOT 0.7 0.9  PROT 6.3* 6.0*  ALBUMIN 3.4* 3.4*    CBC: Recent Labs  Lab 06/10/22 1000 06/11/22 0716 06/12/22 0150 06/13/22 0348 06/16/22 0655  WBC 8.9 6.8 7.4 6.7 6.7  NEUTROABS 5.8 4.3  --   --   --   HGB 12.1  12.6 11.8* 10.9* 11.0* 12.0  HCT 38.1  37.0 36.8 34.0* 33.3* 38.1  MCV 87.8 86.8 86.3 85.4 86.2  PLT 234 226 219 208 211     CBG: Recent Labs  Lab 06/16/22 0616 06/16/22 1139 06/16/22 1640 06/16/22 2053 06/17/22 0558  GLUCAP 79 173* 138* 163* 147*     IMAGING STUDIES CT HEAD WO CONTRAST (5MM)  Result Date: 06/13/2022 CLINICAL DATA:  Hemorrhagic stroke follow-up EXAM: CT HEAD WITHOUT CONTRAST TECHNIQUE: Contiguous axial images were obtained from the base of the skull through the vertex without intravenous contrast. RADIATION DOSE REDUCTION: This exam was performed according to the departmental dose-optimization program which includes automated exposure control, adjustment of the mA and/or kV according to patient size and/or use of iterative reconstruction technique. COMPARISON:  Brain MRI from 2 days ago FINDINGS: Brain: No change in the 4 mm cortical high-density along the superficial left temporal cortex. 4 mm thick subdural collection along the left cerebral convexity at the vertex, low-density. This was not seen on prior studies, see  coronal reformats. Left cortical and white matter infarct centered at the parietal lobe. Extensive chronic small vessel ischemia in the cerebral white matter. Chronic perforator infarct at the right basal ganglia. Generalized cerebral volume loss that is mild for age Vascular: No hyperdense vessel or unexpected calcification. Skull: Normal. Negative for fracture or focal lesion. Sinuses/Orbits: No acute finding. IMPRESSION: A small hygroma has developed along the left cerebral convexity measuring up to 4 mm in thickness. No change in the punctate hemorrhage at the left temporal lobe. Electronically Signed   By: Jorje Guild M.D.   On: 06/13/2022 07:12   MR BRAIN W WO CONTRAST  Result Date: 06/11/2022 CLINICAL DATA:  History of seizures and remote left MCA stroke presents with altered mental status and worsening right-sided weakness. EXAM: MRI HEAD WITHOUT AND WITH CONTRAST TECHNIQUE: Multiplanar, multiecho pulse sequences of the brain and surrounding structures were obtained without and with intravenous contrast. CONTRAST:  7.48m GADAVIST GADOBUTROL 1 MMOL/ML IV SOLN COMPARISON:  CT/CTA head and neck 1 day prior FINDINGS: Brain: There is no evidence of acute infarct There is a small focus  of SWI signal dropout with associated FLAIR signal abnormality in the left anterior temporal lobe corresponding to the hyperdensity seen on prior CT consistent with a small focus of hemorrhage measuring approximately 4 mm. There is no significant mass effect. There is no associated enhancement. There is no other acute intracranial hemorrhage. Parenchymal volume is stable. The ventricles are stable in size. The remote infarct in the left parietal lobe in the MCA distribution with associated encephalomalacia, gliosis, and cortical laminar necrosis is unchanged. There is unchanged ex vacuo dilatation of the right lateral ventricle. Additional remote infarcts in the right corona radiata and background advanced chronic  small-vessel ischemic change are stable. The hippocampi are unremarkable. The pituitary and suprasellar region are normal. There is no mass lesion or abnormal enhancement. There is no mass effect or midline shift. Vascular: Normal flow voids. Skull and upper cervical spine: Normal marrow signal. Sinuses/Orbits: There is mucosal thickening in the right maxillary sinus. Bilateral lens implants are in place. The globes and orbits are otherwise unremarkable. There are bilateral mastoid effusions, nonspecific and also present on the prior study from 2023. Other: None. IMPRESSION: 1. Small focus of hemorrhage in the left anterior temporal lobe is similar to the prior CT, allowing for differences in modality. No significant mass effect. 2. No other acute intracranial pathology. Electronically Signed   By: Valetta Mole M.D.   On: 06/11/2022 15:18   Overnight EEG with video  Result Date: 06/11/2022 Lora Havens, MD     06/11/2022 11:29 AM Patient Name: Lisa Sutton MRN: [id number] Epilepsy Attending: Lora Havens Referring Physician/Provider: Otelia Santee, NP Duration: 06/10/2022 1053 to 06/11/2022 1122 Patient history: 89 year old female with right-sided weakness, altered mental status.  EEG to evaluate for seizure. Level of alertness: Awake, asleep AEDs during EEG study: LEV Technical aspects: This EEG study was done with scalp electrodes positioned according to the 10-20 International system of electrode placement. Electrical activity was reviewed with band pass filter of 1-'70Hz'$ , sensitivity of 7 uV/mm, display speed of 68m/sec with a '60Hz'$  notched filter applied as appropriate. EEG data were recorded continuously and digitally stored.  Video monitoring was available and reviewed as appropriate. Description: The posterior dominant rhythm consists of 9-10 Hz activity of moderate voltage (25-35 uV) seen predominantly in posterior head regions, symmetric and reactive to eye opening and eye closing. Sleep was  characterized by vertex waves, sleep spindles (12 to 14 Hz), maximal frontocentral region. EEG showed intermittent 3-'5Hz'$  theta-delta slowing in left frontotemporal region. Hyperventilation and photic stimulation were not performed.   ABNORMALITY -Intermittent slow, left frontotemporal region. IMPRESSION: This study is suggestive of cortical dysfunction arising from left frontotemporal region likely secondary to underlying structural abnormality/ ICH. No seizures or definite epileptiform discharges were seen throughout the recording. PLora Havens  CT HEAD WO CONTRAST  Result Date: 06/10/2022 CLINICAL DATA:  Stroke, follow up EXAM: CT HEAD WITHOUT CONTRAST TECHNIQUE: Contiguous axial images were obtained from the base of the skull through the vertex without intravenous contrast. RADIATION DOSE REDUCTION: This exam was performed according to the departmental dose-optimization program which includes automated exposure control, adjustment of the mA and/or kV according to patient size and/or use of iterative reconstruction technique. COMPARISON:  CT head 06/10/2022 FINDINGS: Brain: Patchy and confluent areas of decreased attenuation are noted throughout the deep and periventricular white matter of the cerebral hemispheres bilaterally, compatible with chronic microvascular ischemic disease. Chronic left parietooccipital and right corona radiata infarction. No evidence of large-territorial acute infarction. Persistent  slightly less conspicuous a stable in size hyperdensity measuring approximately 4 mm along the left lateral temporal lobe (3:12). No extra-axial acute fluid. Slight enhancing vasculature due to prior CT angiography head neck 06/10/2022. No mass lesion. No extra-axial collection. No mass effect or midline shift. No hydrocephalus. Basilar cisterns are patent. Vascular: No hyperdense vessel. Atherosclerotic calcifications are present within the cavernous internal carotid and vertebral arteries. Skull: No  acute fracture or focal lesion. Sinuses/Orbits: Paranasal sinuses and mastoid air cells are clear. The orbits are unremarkable. Other: None. IMPRESSION: No change with persistent slightly less conspicuous a stable in size hyperdensity measuring approximately 4 mm along the left lateral temporal lobe. Electronically Signed   By: Iven Finn M.D.   On: 06/10/2022 20:18   ECHOCARDIOGRAM COMPLETE  Result Date: 06/10/2022    ECHOCARDIOGRAM REPORT   Patient Name:   DMIYAH LISCANO Date of Exam: 06/10/2022 Medical Rec #:  [id number]         Height:       63.0 in Accession #:    [id number]        Weight:       173.3 lb Date of Birth:  1933/11/22         BSA:          1.819 m Patient Age:    74 years          BP:           107/76 mmHg Patient Gender: F                 HR:           79 bpm. Exam Location:  Inpatient Procedure: 2D Echo, Cardiac Doppler and Color Doppler Indications:    Stroke I63.9  History:        Patient has prior history of Echocardiogram examinations, most                 recent 03/08/2021. Stroke, Signs/Symptoms:Altered Mental Status;                 Risk Factors:Diabetes, Dyslipidemia and Hypertension.  Sonographer:    Greer Pickerel Referring Phys: [id number] Otelia Santee  Sonographer Comments: Technically difficult study due to poor echo windows. Image acquisition challenging due to patient body habitus and Image acquisition challenging due to respiratory motion. IMPRESSIONS  1. Challenging windows. Left ventricular ejection fraction, by estimation, is 55 to 60%. The left ventricle has normal function. The left ventricle has no regional wall motion abnormalities. There is mild left ventricular hypertrophy. Left ventricular diastolic parameters are consistent with Grade I diastolic dysfunction (impaired relaxation).  2. Right ventricular systolic function is normal. The right ventricular size is normal.  3. Left atrial size was mild to moderately dilated.  4. No evidence of mitral valve  regurgitation. Moderate mitral annular calcification.  5. The aortic valve was not well visualized. Aortic valve regurgitation is not visualized.  6. The inferior vena cava is normal in size with greater than 50% respiratory variability, suggesting right atrial pressure of 3 mmHg. Comparison(s): No significant change from prior study. FINDINGS  Left Ventricle: Challenging windows. Left ventricular ejection fraction, by estimation, is 55 to 60%. The left ventricle has normal function. The left ventricle has no regional wall motion abnormalities. The left ventricular internal cavity size was normal in size. There is mild left ventricular hypertrophy. Left ventricular diastolic parameters are consistent with Grade I diastolic dysfunction (impaired relaxation). Right Ventricle: The right ventricular size is  normal. Right ventricular systolic function is normal. Left Atrium: Left atrial size was mild to moderately dilated. Right Atrium: Right atrial size was normal in size. Pericardium: There is no evidence of pericardial effusion. Mitral Valve: Moderate mitral annular calcification. No evidence of mitral valve regurgitation. Tricuspid Valve: Tricuspid valve regurgitation is not demonstrated. Aortic Valve: The aortic valve was not well visualized. Aortic valve regurgitation is not visualized. Pulmonic Valve: The pulmonic valve was not well visualized. Aorta: The aortic root and ascending aorta are structurally normal, with no evidence of dilitation. Venous: The inferior vena cava is normal in size with greater than 50% respiratory variability, suggesting right atrial pressure of 3 mmHg. IAS/Shunts: The interatrial septum was not well visualized.  LEFT VENTRICLE PLAX 2D LVOT diam:     1.50 cm     Diastology LV SV:         39          LV e' medial:    6.68 cm/s LV SV Index:   21          LV E/e' medial:  8.9 LVOT Area:     1.77 cm    LV e' lateral:   5.75 cm/s                            LV E/e' lateral: 10.3  LV Volumes  (MOD) LV vol d, MOD A2C: 40.5 ml LV vol d, MOD A4C: 87.8 ml LV vol s, MOD A2C: 29.7 ml LV vol s, MOD A4C: 45.4 ml LV SV MOD A2C:     10.8 ml LV SV MOD A4C:     87.8 ml LV SV MOD BP:      23.4 ml RIGHT VENTRICLE RV S prime:     11.80 cm/s TAPSE (M-mode): 2.2 cm LEFT ATRIUM             Index        RIGHT ATRIUM           Index LA Vol (A2C):   76.6 ml 42.10 ml/m  RA Area:     19.50 cm LA Vol (A4C):   74.2 ml 40.78 ml/m  RA Volume:   53.30 ml  29.30 ml/m LA Biplane Vol: 80.7 ml 44.36 ml/m  AORTIC VALVE LVOT Vmax:   116.50 cm/s LVOT Vmean:  76.800 cm/s LVOT VTI:    0.218 m  AORTA Ao Root diam: 3.20 cm MITRAL VALVE MV Area (PHT): 3.72 cm     SHUNTS MV Decel Time: 204 msec     Systemic VTI:  0.22 m MV E velocity: 59.20 cm/s   Systemic Diam: 1.50 cm MV A velocity: 112.00 cm/s MV E/A ratio:  0.53 Landscape architect signed by Phineas Inches Signature Date/Time: 06/10/2022/2:55:13 PM    Final    CT ANGIO HEAD NECK W WO CM (CODE STROKE)  Result Date: 06/10/2022 CLINICAL DATA:  Provided history: Neuro deficit, acute, stroke suspected. Acute on chronic right-sided weakness, aphasia, unresponsiveness, possible seizure, chronic left MCA stroke. EXAM: CT ANGIOGRAPHY HEAD AND NECK TECHNIQUE: Multidetector CT imaging of the head and neck was performed using the standard protocol during bolus administration of intravenous contrast. Multiplanar CT image reconstructions and MIPs were obtained to evaluate the vascular anatomy. Carotid stenosis measurements (when applicable) are obtained utilizing NASCET criteria, using the distal internal carotid diameter as the denominator. RADIATION DOSE REDUCTION: This exam was performed according to the departmental dose-optimization program which includes automated  exposure control, adjustment of the mA and/or kV according to patient size and/or use of iterative reconstruction technique. CONTRAST:  56m OMNIPAQUE IOHEXOL 350 MG/ML SOLN COMPARISON:  Contrast head CT performed earlier the  same day 06/10/2022. CT angiogram head/neck 10/28/2021. FINDINGS: CTA NECK FINDINGS Aortic arch: Standard aortic branching. Atherosclerotic plaque within the visualized aortic arch and proximal major branch vessels of the neck., 6 Streak and beam hardening artifact arising from a dense left-sided contrast bolus partially obscures the left subclavian artery. Within this limitation, there is no appreciable hemodynamically significant innominate or proximal subclavian artery stenosis. Right carotid system: CCA and ICA patent within the neck without stenosis or significant atherosclerotic disease. Left carotid system: CCA and ICA patent within the neck without stenosis. Mild atherosclerotic plaque about the carotid bifurcation. Vertebral arteries: Vertebral arteries codominant and patent within the neck. Redemonstrated moderate to severe atherosclerotic narrowing at the origin of the left vertebral artery. Skeleton: Cervical spondylosis. Congenital nonunion of the posterior arch of C1. No acute fracture or aggressive osseous lesion. Other neck: No neck mass or cervical lymphadenopathy. Upper chest: No consolidation within the imaged lung apices. Review of the MIP images confirms the above findings CTA HEAD FINDINGS Anterior circulation: The intracranial internal carotid arteries are patent. Atherosclerotic plaque within the intracranial right ICA with no more than mild stenosis. Mild atherosclerotic stenosis within the left cavernous segment. Up to moderate atherosclerotic narrowing within the paraclinoid left ICA. The M1 middle cerebral arteries are patent. Atherosclerotic irregularity of the M2 and more distal MCA vessels, bilaterally. Most notably, there is an unchanged moderate/severe focal stenosis within a mid M2 left MCA vessel (series 11, image 13). The anterior cerebral arteries are patent. Mild atherosclerotic irregularity of both vessels. No intracranial aneurysm is identified. Posterior circulation: The  intracranial vertebral arteries are patent. The basilar artery is patent. The posterior cerebral arteries are patent. Atherosclerotic irregularity of both vessels, most notably as follows. New severe stenosis within the left PCA P1 segment. Redemonstrated mild-to-moderate focal stenosis within the left PCA P2 segment. Redemonstrated moderate/severe stenosis within the right PCA P2 segment. Small posterior communicating arteries are present, bilaterally. Venous sinuses: Within the limitations of contrast timing, no convincing thrombus. Anatomic variants: As described. Review of the MIP images confirms the above findings CTA head impressions #1 and #3 called by telephone at the time of interpretation on 06/10/2022 at 10:35 am [facility] verbally acknowledged these results. IMPRESSION: CTA neck: 1. The common carotid and internal carotid arteries are patent within the neck without stenosis. Mild atherosclerotic plaque about the left carotid bifurcation. 2. The vertebral arteries are patent within the neck. Redemonstrated moderate-to-severe atherosclerotic stenosis at the origin of the left vertebral artery. CTA head: 1. No intracranial large vessel occlusion is identified. 2. Intracranial atherosclerotic disease, as outlined and most notably as follows. 3. Severe stenosis within the left PCA P1 segment, new from the prior CTA of 10/28/2021. 4. Redemonstrated moderate/severe stenosis within the right PCA P2 segment. 5. Redemonstrated moderate/severe stenosis within a mid M2 left MCA vessel. Electronically Signed   By: KKellie SimmeringD.O.   On: 06/10/2022 10:35   CT HEAD CODE STROKE WO CONTRAST  Result Date: 06/10/2022 CLINICAL DATA:  Code stroke.  Right handed weakness EXAM: CT HEAD WITHOUT CONTRAST TECHNIQUE: Contiguous axial images were obtained from the base of the skull through the vertex without intravenous contrast. RADIATION DOSE REDUCTION: This exam was performed according to the  departmental dose-optimization program which includes automated exposure control,  adjustment of the mA and/or kV according to patient size and/or use of iterative reconstruction technique. COMPARISON:  10/28/2021 FINDINGS: Brain: New small hyperdensity along the superficial cortex of the lateral left temporal lobe measuring 4 mm. Chronic left parietooccipital cortically based infarct. Chronic perforator infarct at the right corona radiata. No hydrocephalus or masslike finding. Generalized cerebral volume loss and extensive chronic small vessel ischemia. Vascular: No hyperdense vessel or unexpected calcification. Skull: Normal. Negative for fracture or focal lesion. Sinuses/Orbits: No acute finding. Other: These results were called by telephone at the time of interpretation on 06/10/2022 at 10:03 am to provider [facility] , who verbally acknowledged these results. IMPRESSION: 1. New 4 mm high-density along the left temporal cortex suggesting acute hemorrhage. No acute infarct noted. 2. Chronic left parieto-occipital and right corona radiata infarcts. Electronically Signed   By: Jorje Guild M.D.   On: 06/10/2022 10:10    DISCHARGE EXAMINATION: Vitals:   06/16/22 1953 06/16/22 2335 06/17/22 0322 06/17/22 0756  BP: 112/72 108/83 95/63 (!) 144/82  Pulse: 94 89 87 83  Resp: '17 18 18 18  '$ Temp: 98 F (36.7 C) (!) 97.5 F (36.4 C) 98.4 F (36.9 C) 97.9 F (36.6 C)  TempSrc: Oral Oral  Oral  SpO2: 96% 95% 95% 97%  Weight:      Height:       General appearance: Awake alert.  In no distress Resp: Clear to auscultation bilaterally.  Normal effort Cardio: S1-S2 is normal regular.  No S3-S4.  No rubs murmurs or bruit GI: Abdomen is soft.  Nontender nondistended.  Bowel sounds are present normal.  No masses organomegaly    DISPOSITION: SNF  Discharge Instructions     Ambulatory referral to Neurology   Complete by: As directed    Follow up with Dr. Leta Baptist at [facility] in 4-6 weeks.  Patient is Dr.  Gladstone Lighter patient. Thanks.   Call MD for:  difficulty breathing, headache or visual disturbances   Complete by: As directed    Call MD for:  hives   Complete by: As directed    Call MD for:  persistant dizziness or light-headedness   Complete by: As directed    Call MD for:  persistant nausea and vomiting   Complete by: As directed    Call MD for:  severe uncontrolled pain   Complete by: As directed    Call MD for:  temperature >100.4   Complete by: As directed    Diet - low sodium heart healthy   Complete by: As directed    Discharge instructions   Complete by: As directed    Please review instructions on the discharge summary  You were cared for by a hospitalist during your hospital stay. If you have any questions about your discharge medications or the care you received while you were in the hospital after you are discharged, you can call the unit and asked to speak with the hospitalist on call if the hospitalist that took care of you is not available. Once you are discharged, your primary care physician will handle any further medical issues. Please note that NO REFILLS for any discharge medications will be authorized once you are discharged, as it is imperative that you return to your primary care physician (or establish a relationship with a primary care physician if you do not have one) for your aftercare needs so that they can reassess your need for medications and monitor your lab values. If you do not have a primary care  physician, you can call [phone] for a physician referral.   Increase activity slowly   Complete by: As directed           Allergies as of 06/17/2022       Reactions   Statins Other (See Comments)   'discomfort, aching everywhere' Tolerates to livalo    Empagliflozin Other (See Comments)   Other    Allergic to diabetic medication but unsure of name. Went to ED February 2023 (ED note I do not see name of med)    Zetia [ezetimibe] Other (See Comments)    Leg pain   Zocor [simvastatin] Other (See Comments)   Leg pain        Medication List     STOP taking these medications    clopidogrel 75 MG tablet Commonly known as: Plavix   diphenhydrAMINE 25 MG tablet Commonly known as: BENADRYL   hydrOXYzine 50 MG tablet Commonly known as: ATARAX   losartan 100 MG tablet Commonly known as: COZAAR       TAKE these medications    amLODipine 10 MG tablet Commonly known as: NORVASC Take 10 mg by mouth daily.   aspirin EC 81 MG tablet Take 81 mg by mouth daily. Swallow whole.   cholecalciferol 25 MCG (1000 UNIT) tablet Commonly known as: VITAMIN D3 Take 1,000 Units by mouth daily.   glipiZIDE 5 MG tablet Commonly known as: GLUCOTROL Take 5 mg by mouth every morning.   Jardiance 10 MG Tabs tablet Generic drug: empagliflozin Take 10 mg by mouth daily.   levETIRAcetam 250 MG tablet Commonly known as: KEPPRA Take 1 tablet (250 mg total) by mouth 2 (two) times daily.   levothyroxine 125 MCG tablet Commonly known as: SYNTHROID Take 1 tablet (125 mcg total) by mouth daily before breakfast.   meclizine 25 MG tablet Commonly known as: ANTIVERT Take 25 mg by mouth 2 (two) times daily as needed for dizziness.   metFORMIN 500 MG 24 hr tablet Commonly known as: GLUCOPHAGE-XR Take 500 mg by mouth daily.   oxyCODONE 5 MG immediate release tablet Commonly known as: Roxicodone Take 0.5 tablets (2.5 mg total) by mouth 2 (two) times daily as needed for severe pain.   pantoprazole 40 MG tablet Commonly known as: PROTONIX Take 1 tablet (40 mg total) by mouth daily before lunch.   rosuvastatin 40 MG tablet Commonly known as: CRESTOR Take 40 mg by mouth at bedtime.   triamcinolone cream 0.1 % Commonly known as: KENALOG Apply 1 application. topically daily as needed (irritation).   Valtoco 5 MG Dose 5 MG/0.1ML Liqd Generic drug: diazePAM Spray in nose if seizure last more than 5 minutes. Can repeat dose in 4 hours. Do not use  more than twice in 24 hours.         Follow-up Information     Penumalli, Earlean Polka, MD. Schedule an appointment as soon as possible for a visit in 1 month(s).   Specialties: Neurology, Radiology Contact information: [address] [place] [address] [phone]                 TOTAL DISCHARGE TIME: 35 minutes  [place]  [facility] Pager on [email or web address]  06/17/2022, 9:50 AM

## 2022-06-17 NOTE — Progress Notes (Signed)
Mobility Specialist: Progress Note   06/17/22 1103  Mobility  Activity Ambulated with assistance in hallway  Level of Assistance Contact guard assist, steadying assist  Assistive Device Front wheel walker  Distance Ambulated (ft) 400 ft  Activity Response Tolerated well  Mobility Referral Yes  $Mobility charge 1 Mobility   Pt received in the bed and agreeable to mobility. Mod I with bed mobility as well as to stand. Contact guard during ambulation. C/o general fatigue, otherwise asymptomatic. Pt back to bed after session per request with call bell and phone in reach.   [place] Gangemi Mobility Specialist Please contact via SecureChat or Rehab office at [phone]

## 2022-06-17 NOTE — Plan of Care (Signed)
  Problem: Education: Goal: Knowledge of disease or condition will improve Outcome: Progressing Goal: Knowledge of secondary prevention will improve (MUST DOCUMENT ALL) Outcome: Progressing Goal: Knowledge of patient specific risk factors will improve Lisa Sutton N/A or DELETE if not current risk factor) Outcome: Progressing   Problem: Intracerebral Hemorrhage Tissue Perfusion: Goal: Complications of Intracerebral Hemorrhage will be minimized Outcome: Progressing   Problem: Coping: Goal: Will verbalize positive feelings about self Outcome: Progressing Goal: Will identify appropriate support needs Outcome: Progressing   Problem: Health Behavior/Discharge Planning: Goal: Ability to manage health-related needs will improve Outcome: Progressing Goal: Goals will be collaboratively established with patient/family Outcome: Progressing   Problem: Self-Care: Goal: Ability to participate in self-care as condition permits will improve Outcome: Progressing Goal: Verbalization of feelings and concerns over difficulty with self-care will improve Outcome: Progressing Goal: Ability to communicate needs accurately will improve Outcome: Progressing   Problem: Nutrition: Goal: Risk of aspiration will decrease Outcome: Progressing Goal: Dietary intake will improve Outcome: Progressing   Problem: Education: Goal: Knowledge of General Education information will improve Description: Including pain rating scale, medication(s)/side effects and non-pharmacologic comfort measures Outcome: Progressing   Problem: Health Behavior/Discharge Planning: Goal: Ability to manage health-related needs will improve Outcome: Progressing   Problem: Clinical Measurements: Goal: Ability to maintain clinical measurements within normal limits will improve Outcome: Progressing Goal: Will remain free from infection Outcome: Progressing Goal: Diagnostic test results will improve Outcome: Progressing Goal:  Respiratory complications will improve Outcome: Progressing Goal: Cardiovascular complication will be avoided Outcome: Progressing   Problem: Activity: Goal: Risk for activity intolerance will decrease Outcome: Progressing   Problem: Nutrition: Goal: Adequate nutrition will be maintained Outcome: Progressing   Problem: Coping: Goal: Level of anxiety will decrease Outcome: Progressing   Problem: Elimination: Goal: Will not experience complications related to bowel motility Outcome: Progressing Goal: Will not experience complications related to urinary retention Outcome: Progressing   Problem: Pain Managment: Goal: General experience of comfort will improve Outcome: Progressing   Problem: Safety: Goal: Ability to remain free from injury will improve Outcome: Progressing   Problem: Skin Integrity: Goal: Risk for impaired skin integrity will decrease Outcome: Progressing   Problem: Education: Goal: Ability to describe self-care measures that may prevent or decrease complications (Diabetes Survival Skills Education) will improve Outcome: Progressing Goal: Individualized Educational Video(s) Outcome: Progressing   Problem: Coping: Goal: Ability to adjust to condition or change in health will improve Outcome: Progressing   Problem: Fluid Volume: Goal: Ability to maintain a balanced intake and output will improve Outcome: Progressing   Problem: Health Behavior/Discharge Planning: Goal: Ability to identify and utilize available resources and services will improve Outcome: Progressing Goal: Ability to manage health-related needs will improve Outcome: Progressing   Problem: Metabolic: Goal: Ability to maintain appropriate glucose levels will improve Outcome: Progressing   Problem: Nutritional: Goal: Maintenance of adequate nutrition will improve Outcome: Progressing Goal: Progress toward achieving an optimal weight will improve Outcome: Progressing   Problem:  Skin Integrity: Goal: Risk for impaired skin integrity will decrease Outcome: Progressing   Problem: Tissue Perfusion: Goal: Adequacy of tissue perfusion will improve Outcome: Progressing

## 2022-06-17 NOTE — Plan of Care (Signed)
  Problem: Education: Goal: Knowledge of disease or condition will improve Outcome: Progressing Goal: Knowledge of secondary prevention will improve (MUST DOCUMENT ALL) Outcome: Progressing Goal: Knowledge of patient specific risk factors will improve [name] N/A or DELETE if not current risk factor) Outcome: Progressing   Problem: Intracerebral Hemorrhage Tissue Perfusion: Goal: Complications of Intracerebral Hemorrhage will be minimized Outcome: Progressing   Problem: Coping: Goal: Will verbalize positive feelings about self Outcome: Progressing Goal: Will identify appropriate support needs Outcome: Progressing   Problem: Health Behavior/Discharge Planning: Goal: Ability to manage health-related needs will improve Outcome: Progressing Goal: Goals will be collaboratively established with patient/family Outcome: Progressing   Problem: Self-Care: Goal: Ability to participate in self-care as condition permits will improve Outcome: Progressing Goal: Verbalization of feelings and concerns over difficulty with self-care will improve Outcome: Progressing Goal: Ability to communicate needs accurately will improve Outcome: Progressing   Problem: Nutrition: Goal: Risk of aspiration will decrease Outcome: Progressing Goal: Dietary intake will improve Outcome: Progressing   Problem: Education: Goal: Knowledge of General Education information will improve Description: Including pain rating scale, medication(s)/side effects and non-pharmacologic comfort measures Outcome: Progressing   Problem: Health Behavior/Discharge Planning: Goal: Ability to manage health-related needs will improve Outcome: Progressing   Problem: Clinical Measurements: Goal: Ability to maintain clinical measurements within normal limits will improve Outcome: Progressing Goal: Will remain free from infection Outcome: Progressing Goal: Diagnostic test results will improve Outcome: Progressing Goal:  Respiratory complications will improve Outcome: Progressing Goal: Cardiovascular complication will be avoided Outcome: Progressing   Problem: Activity: Goal: Risk for activity intolerance will decrease Outcome: Progressing   Problem: Nutrition: Goal: Adequate nutrition will be maintained Outcome: Progressing   Problem: Coping: Goal: Level of anxiety will decrease Outcome: Progressing   Problem: Elimination: Goal: Will not experience complications related to bowel motility Outcome: Progressing Goal: Will not experience complications related to urinary retention Outcome: Progressing   Problem: Pain Managment: Goal: General experience of comfort will improve Outcome: Progressing   Problem: Safety: Goal: Ability to remain free from injury will improve Outcome: Progressing   Problem: Skin Integrity: Goal: Risk for impaired skin integrity will decrease Outcome: Progressing   Problem: Education: Goal: Ability to describe self-care measures that may prevent or decrease complications (Diabetes Survival Skills Education) will improve Outcome: Progressing Goal: Individualized Educational Video(s) Outcome: Progressing   Problem: Coping: Goal: Ability to adjust to condition or change in health will improve Outcome: Progressing   Problem: Fluid Volume: Goal: Ability to maintain a balanced intake and output will improve Outcome: Progressing   Problem: Health Behavior/Discharge Planning: Goal: Ability to identify and utilize available resources and services will improve Outcome: Progressing Goal: Ability to manage health-related needs will improve Outcome: Progressing   Problem: Metabolic: Goal: Ability to maintain appropriate glucose levels will improve Outcome: Progressing   Problem: Nutritional: Goal: Maintenance of adequate nutrition will improve Outcome: Progressing Goal: Progress toward achieving an optimal weight will improve Outcome: Progressing   Problem:  Skin Integrity: Goal: Risk for impaired skin integrity will decrease Outcome: Progressing   Problem: Tissue Perfusion: Goal: Adequacy of tissue perfusion will improve Outcome: Progressing

## 2022-06-17 NOTE — TOC Progression Note (Signed)
Transition of Care [facility]) - Progression Note    Patient Details  Name: Lisa Sutton MRN: [id number] Date of Birth: December 19, 1933  Transition of Care [facility]) CM/SW [place], [place] Phone Number: 06/17/2022, 2:38 PM  Clinical Narrative:   CSW received insurance authorization for patient to admit to [facility] tomorrow. CSW updated son, Heron Sabins, who is in agreement. CSW updated MD with barrier to discharge. CSW to follow.    Expected Discharge Plan: [place] Barriers to Discharge: Continued Medical Work up, Ship broker  Expected Discharge Plan and [place] Choice: [place] Living arrangements for the past 2 months: Single Family Home Expected Discharge Date: 06/17/22                                     Social Determinants of Health (SDOH) Interventions SDOH Screenings   Food Insecurity: No Food Insecurity (06/10/2022)  Housing: Low Risk  (06/10/2022)  Transportation Needs: No Transportation Needs (06/10/2022)  Utilities: Not At Risk (06/10/2022)  Alcohol Screen: Low Risk  (09/05/2019)  Depression (PHQ2-9): Low Risk  (07/16/2019)  Financial Resource Strain: Low Risk  (09/05/2019)  Physical Activity: Sufficiently Active (09/05/2019)  Social Connections: Moderately Integrated (09/05/2019)  Stress: No Stress Concern Present (09/05/2019)  Tobacco Use: Low Risk  (06/14/2022)    Readmission Risk Interventions     No data to display

## 2022-06-18 DIAGNOSIS — I611 Nontraumatic intracerebral hemorrhage in hemisphere, cortical: Secondary | ICD-10-CM | POA: Diagnosis not present

## 2022-06-18 LAB — GLUCOSE, CAPILLARY
Glucose-Capillary: 137 mg/dL — ABNORMAL HIGH (ref 70–99)
Glucose-Capillary: 144 mg/dL — ABNORMAL HIGH (ref 70–99)
Glucose-Capillary: 170 mg/dL — ABNORMAL HIGH (ref 70–99)

## 2022-06-18 MED ORDER — GABAPENTIN 100 MG PO CAPS: 100.0000 mg | ORAL_CAPSULE | Freq: Three times a day (TID) | ORAL | 2 refills | Status: DC

## 2022-06-18 NOTE — Care Management Important Message (Signed)
Important Message  Patient Details  Name: Lisa Sutton MRN: [id number] Date of Birth: 09/03/1933   Medicare Important Message Given:  Yes     Hannah Beat 06/18/2022, 3:17 PM

## 2022-06-18 NOTE — Final Progress Note (Signed)
Pt is discharged to SNF. PIV removed. Education provided. All personal belongings were returned to the patient. Left the floor at 1230 in good and stable condition.

## 2022-06-18 NOTE — Progress Notes (Signed)
Occupational Therapy Treatment Patient Details Name: Lisa Sutton MRN: [id number] DOB: 01-23-1934 Today's Date: 06/18/2022   History of present illness 89 y.o. female presents to [facility] hospital 03/06/2021 with aphasia and fall. + for small ICH along left temporal cortex etiology indeterminate likely micro vascular disease from hypertension versus amyloid. PMH includes HTN, GERD, Hep B, HLD, Obesity, osteoporosis.   OT comments  Pt progressing towards established OT goals. Focusing session on cognition and UE strengthening. Pt pulling up socks sitting EOB with min guard A. Performing standing shoulder flexion to challenge UE strength and balance with min guard A 10 repetitions. Functional mobility into hallway to challenge cognition and attention/visual compensation. Pt required min cues to locate room due to decreased attention to R. Challenging activity tolerance and strength with modified chair push ups with pt rising into squatted position. Continue to recommend SNF for continued OT services.    Recommendations for follow up therapy are one component of a multi-disciplinary discharge planning process, led by the attending physician.  Recommendations may be updated based on patient status, additional functional criteria and insurance authorization.    Follow Up Recommendations  Skilled nursing-short term rehab (<3 hours/day)     Assistance Recommended at Discharge Frequent or constant Supervision/Assistance  Patient can return home with the following  A little help with walking and/or transfers;A little help with bathing/dressing/bathroom;Assistance with cooking/housework;Direct supervision/assist for medications management;Direct supervision/assist for financial management;Assist for transportation;Help with stairs or ramp for entrance   Equipment Recommendations  None recommended by OT    Recommendations for Other Services      Precautions / Restrictions Precautions Precautions:  Fall;Other (comment) Precaution Comments: seizure Restrictions Weight Bearing Restrictions: No       Mobility Bed Mobility Overal bed mobility: Needs Assistance Bed Mobility: Supine to Sit, Sit to Supine     Supine to sit: Supervision Sit to supine: Min guard, Supervision   General bed mobility comments: supervision for safety    Transfers Overall transfer level: Needs assistance Equipment used: Rolling walker (2 wheels) Transfers: Sit to/from Stand Sit to Stand: Supervision           General transfer comment: supervision for safety.     Balance Overall balance assessment: Needs assistance Sitting-balance support: No upper extremity supported, Feet supported Sitting balance-Leahy Scale: Good     Standing balance support: Bilateral upper extremity supported, No upper extremity supported, During functional activity Standing balance-Leahy Scale: Fair                             ADL either performed or assessed with clinical judgement   ADL Overall ADL's : Needs assistance/impaired     Grooming: Brushing hair;Set up;Sitting Grooming Details (indicate cue type and reason): brushing heair sitting EOB             Lower Body Dressing: Min guard;Sitting/lateral leans Lower Body Dressing Details (indicate cue type and reason): pulling up socks sitting EOB Toilet Transfer: Min guard;Rolling walker (2 wheels);Ambulation Toilet Transfer Details (indicate cue type and reason): simulated in room         Functional mobility during ADLs: Min guard;Rolling walker (2 wheels) General ADL Comments: focus session on BUE strengthening, cognition and activity tolerance.    Extremity/Trunk Assessment Upper Extremity Assessment Upper Extremity Assessment: Generalized weakness RUE Deficits / Details: decreased strength as compared to L/decreased GM   Lower Extremity Assessment Lower Extremity Assessment: Defer to PT evaluation  Vision   Additional  Comments: min cues to locate signage outside of door when returning to room from hall as it was on her R   Perception     Praxis      Cognition Arousal/Alertness: Awake/alert Behavior During Therapy: WFL for tasks assessed/performed Overall Cognitive Status: Difficult to assess Area of Impairment: Safety/judgement, Problem solving, Following commands, Awareness, Orientation                 Orientation Level: Disoriented to, Time     Following Commands: Follows one step commands consistently Safety/Judgement: Decreased awareness of safety, Decreased awareness of deficits Awareness: Emergent Problem Solving: Slow processing, Difficulty sequencing, Requires verbal cues, Requires tactile cues General Comments: following all one step and most two step commands this session. Motivated to participate in therapy. Min A for problem solving to locate room after walk in hall. Greater safety with RW use this session, keeping RW with her when backing up to bed.        Exercises Exercises: Other exercises Other Exercises Other Exercises: chair push ups x15 Other Exercises: BUE shoulder flexion in standing 10x    Shoulder Instructions       General Comments greater safety with RW this session    Pertinent Vitals/ Pain       Pain Assessment Pain Assessment: No/denies pain  Home Living                                          Prior Functioning/Environment              Frequency  Min 2X/week        Progress Toward Goals  OT Goals(current goals can now be found in the care plan section)  Progress towards OT goals: Progressing toward goals  Acute Rehab OT Goals Patient Stated Goal: get more energy OT Goal Formulation: With patient Time For Goal Achievement: 06/26/22 Potential to Achieve Goals: Good ADL Goals Pt Will Perform Grooming: with modified independence;standing Pt Will Perform Lower Body Dressing: with set-up;sit to/from stand Pt Will  Transfer to Toilet: with modified independence;ambulating;regular height toilet Pt/caregiver will Perform Home Exercise Program: Increased strength;Right Upper extremity;Left upper extremity;Both right and left upper extremity;With written HEP provided  Plan Discharge plan remains appropriate;Frequency remains appropriate    Co-evaluation                 AM-PAC OT "6 Clicks" Daily Activity     Outcome Measure   Help from another person eating meals?: A Little Help from another person taking care of personal grooming?: A Little Help from another person toileting, which includes using toliet, bedpan, or urinal?: A Little Help from another person bathing (including washing, rinsing, drying)?: A Little Help from another person to put on and taking off regular upper body clothing?: A Little Help from another person to put on and taking off regular lower body clothing?: A Little 6 Click Score: 18    End of Session Equipment Utilized During Treatment: Gait belt;Rolling walker (2 wheels)  OT Visit Diagnosis: Unsteadiness on feet (R26.81);Muscle weakness (generalized) (M62.81);Other symptoms and signs involving cognitive function;Cognitive communication deficit (R41.841)   Activity Tolerance Patient tolerated treatment well   Patient Left in bed;with bed alarm set;with call bell/phone within reach   Nurse Communication Mobility status        Time: 5277-8242 OT Time Calculation (min): 22 min  Charges: OT General Charges $OT Visit: 1 Visit OT Treatments $Self Care/Home Management : 8-22 mins  Elder Cyphers, OTR/L [facility] Acute Rehabilitation Office: [phone]   Magnus Ivan 06/18/2022, 10:02 AM

## 2022-06-18 NOTE — Plan of Care (Signed)
  Problem: Education: Goal: Knowledge of disease or condition will improve Outcome: Progressing Goal: Knowledge of secondary prevention will improve (MUST DOCUMENT ALL) Outcome: Progressing Goal: Knowledge of patient specific risk factors will improve [name] N/A or DELETE if not current risk factor) Outcome: Progressing   Problem: Intracerebral Hemorrhage Tissue Perfusion: Goal: Complications of Intracerebral Hemorrhage will be minimized Outcome: Progressing   Problem: Coping: Goal: Will verbalize positive feelings about self Outcome: Progressing Goal: Will identify appropriate support needs Outcome: Progressing   Problem: Health Behavior/Discharge Planning: Goal: Ability to manage health-related needs will improve Outcome: Progressing Goal: Goals will be collaboratively established with patient/family Outcome: Progressing   Problem: Self-Care: Goal: Ability to participate in self-care as condition permits will improve Outcome: Progressing Goal: Verbalization of feelings and concerns over difficulty with self-care will improve Outcome: Progressing Goal: Ability to communicate needs accurately will improve Outcome: Progressing   Problem: Nutrition: Goal: Risk of aspiration will decrease Outcome: Progressing Goal: Dietary intake will improve Outcome: Progressing   Problem: Education: Goal: Knowledge of General Education information will improve Description: Including pain rating scale, medication(s)/side effects and non-pharmacologic comfort measures Outcome: Progressing   Problem: Health Behavior/Discharge Planning: Goal: Ability to manage health-related needs will improve Outcome: Progressing   Problem: Clinical Measurements: Goal: Ability to maintain clinical measurements within normal limits will improve Outcome: Progressing Goal: Will remain free from infection Outcome: Progressing Goal: Diagnostic test results will improve Outcome: Progressing Goal:  Respiratory complications will improve Outcome: Progressing Goal: Cardiovascular complication will be avoided Outcome: Progressing   Problem: Activity: Goal: Risk for activity intolerance will decrease Outcome: Progressing   Problem: Nutrition: Goal: Adequate nutrition will be maintained Outcome: Progressing   Problem: Coping: Goal: Level of anxiety will decrease Outcome: Progressing   Problem: Elimination: Goal: Will not experience complications related to bowel motility Outcome: Progressing Goal: Will not experience complications related to urinary retention Outcome: Progressing   Problem: Pain Managment: Goal: General experience of comfort will improve Outcome: Progressing   Problem: Safety: Goal: Ability to remain free from injury will improve Outcome: Progressing   Problem: Skin Integrity: Goal: Risk for impaired skin integrity will decrease Outcome: Progressing   Problem: Education: Goal: Ability to describe self-care measures that may prevent or decrease complications (Diabetes Survival Skills Education) will improve Outcome: Progressing Goal: Individualized Educational Video(s) Outcome: Progressing   Problem: Coping: Goal: Ability to adjust to condition or change in health will improve Outcome: Progressing   Problem: Fluid Volume: Goal: Ability to maintain a balanced intake and output will improve Outcome: Progressing   Problem: Health Behavior/Discharge Planning: Goal: Ability to identify and utilize available resources and services will improve Outcome: Progressing Goal: Ability to manage health-related needs will improve Outcome: Progressing   Problem: Metabolic: Goal: Ability to maintain appropriate glucose levels will improve Outcome: Progressing   Problem: Nutritional: Goal: Maintenance of adequate nutrition will improve Outcome: Progressing Goal: Progress toward achieving an optimal weight will improve Outcome: Progressing   Problem:  Skin Integrity: Goal: Risk for impaired skin integrity will decrease Outcome: Progressing   Problem: Tissue Perfusion: Goal: Adequacy of tissue perfusion will improve Outcome: Progressing

## 2022-06-18 NOTE — Plan of Care (Signed)
VeRN called report to receiving facility [facility] care of Lisa Sutton. VeRN answered receiving RN questions. No new questions at this time. Problem: Education: Goal: Knowledge of disease or condition will improve Outcome: Adequate for Discharge Goal: Knowledge of secondary prevention will improve (MUST DOCUMENT ALL) Outcome: Adequate for Discharge Goal: Knowledge of patient specific risk factors will improve [name] N/A or DELETE if not current risk factor) Outcome: Adequate for Discharge   Problem: Intracerebral Hemorrhage Tissue Perfusion: Goal: Complications of Intracerebral Hemorrhage will be minimized Outcome: Adequate for Discharge   Problem: Coping: Goal: Will verbalize positive feelings about self Outcome: Adequate for Discharge Goal: Will identify appropriate support needs Outcome: Adequate for Discharge   Problem: Health Behavior/Discharge Planning: Goal: Ability to manage health-related needs will improve Outcome: Adequate for Discharge Goal: Goals will be collaboratively established with patient/family Outcome: Adequate for Discharge   Problem: Self-Care: Goal: Ability to participate in self-care as condition permits will improve Outcome: Adequate for Discharge Goal: Verbalization of feelings and concerns over difficulty with self-care will improve Outcome: Adequate for Discharge Goal: Ability to communicate needs accurately will improve Outcome: Adequate for Discharge   Problem: Nutrition: Goal: Risk of aspiration will decrease Outcome: Adequate for Discharge Goal: Dietary intake will improve Outcome: Adequate for Discharge   Problem: Education: Goal: Knowledge of General Education information will improve Description: Including pain rating scale, medication(s)/side effects and non-pharmacologic comfort measures Outcome: Adequate for Discharge   Problem: Health Behavior/Discharge Planning: Goal: Ability to manage health-related needs will improve Outcome: Adequate for  Discharge   Problem: Clinical Measurements: Goal: Ability to maintain clinical measurements within normal limits will improve Outcome: Adequate for Discharge Goal: Will remain free from infection Outcome: Adequate for Discharge Goal: Diagnostic test results will improve Outcome: Adequate for Discharge Goal: Respiratory complications will improve Outcome: Adequate for Discharge Goal: Cardiovascular complication will be avoided Outcome: Adequate for Discharge   Problem: Activity: Goal: Risk for activity intolerance will decrease Outcome: Adequate for Discharge   Problem: Nutrition: Goal: Adequate nutrition will be maintained Outcome: Adequate for Discharge   Problem: Coping: Goal: Level of anxiety will decrease Outcome: Adequate for Discharge   Problem: Elimination: Goal: Will not experience complications related to bowel motility Outcome: Adequate for Discharge Goal: Will not experience complications related to urinary retention Outcome: Adequate for Discharge   Problem: Pain Managment: Goal: General experience of comfort will improve Outcome: Adequate for Discharge   Problem: Safety: Goal: Ability to remain free from injury will improve Outcome: Adequate for Discharge   Problem: Skin Integrity: Goal: Risk for impaired skin integrity will decrease Outcome: Adequate for Discharge   Problem: Education: Goal: Ability to describe self-care measures that may prevent or decrease complications (Diabetes Survival Skills Education) will improve Outcome: Adequate for Discharge Goal: Individualized Educational Video(s) Outcome: Adequate for Discharge   Problem: Coping: Goal: Ability to adjust to condition or change in health will improve Outcome: Adequate for Discharge   Problem: Fluid Volume: Goal: Ability to maintain a balanced intake and output will improve Outcome: Adequate for Discharge   Problem: Health Behavior/Discharge Planning: Goal: Ability to identify and  utilize available resources and services will improve Outcome: Adequate for Discharge Goal: Ability to manage health-related needs will improve Outcome: Adequate for Discharge   Problem: Metabolic: Goal: Ability to maintain appropriate glucose levels will improve Outcome: Adequate for Discharge   Problem: Nutritional: Goal: Maintenance of adequate nutrition will improve Outcome: Adequate for Discharge Goal: Progress toward achieving an optimal weight will improve Outcome: Adequate for Discharge   Problem:  Skin Integrity: Goal: Risk for impaired skin integrity will decrease Outcome: Adequate for Discharge   Problem: Tissue Perfusion: Goal: Adequacy of tissue perfusion will improve Outcome: Adequate for Discharge

## 2022-06-18 NOTE — Discharge Summary (Signed)
[facility]  Physician Discharge Summary   Patient ID: Lisa Sutton MRN: [id number] DOB/AGE: [birth year] 89 y.o.  Admit date: 06/10/2022 Discharge date:   06/18/2022   PCP: Adaline Sill, NP  DISCHARGE DIAGNOSES:    ICH (intracerebral hemorrhage) ([place])   Seizure ([place])   RECOMMENDATIONS FOR OUTPATIENT FOLLOW UP: Check CBC and basic metabolic panel in 1 week Ambulatory referral sent to neurology for follow-up  Home Health: Going to SNF Equipment/Devices: None  CODE STATUS: Full code  DISCHARGE CONDITION: fair  Diet recommendation: Heart healthy  INITIAL HISTORY: 89 y.o. female with history of seizures, hypertension, hyperlipidemia, diabetes and left MCA stroke presenting after she had an unwitnessed fall with acute onset altered mental status and worsening right-sided weakness.  She does have a history of seizures and may have had a seizure prior to the unwitnessed fall, as she had her normal seizure prodrome of right-sided pain and sensory deficit before falling.  CT head shows small ICH along left temporal cortex.    Consultants: Neurology   Procedures: Echocardiogram   HOSPITAL COURSE:   Intracranial hemorrhage Found to have a small intracranial hemorrhage in the left hemisphere.  Seen by neurology.  Was on aspirin and Plavix prior to admission both of which were placed on hold. Echocardiogram showed EF of 55 to 60% with grade 1 diastolic dysfunction. LDL 30.  HbA1c 7.2. CT head repeated by neurology.  Thought to be stable.  Aspirin resumed per neurology. Seen by physical therapy.  Skilled nursing facility is recommended for short-term rehab.    Seizure disorder Continue Keppra.  Long-term monitoring did not show any seizure activity. Stable from a neurological standpoint.   Essential hypertension Goal blood pressure is less than 295 systolic.  May resume amlodipine at discharge.  Will continue to hold ARB due to CKD. Ventricular bigeminy noted  on telemetry.  Patient is asymptomatic.   Chronic kidney disease stage IV/hyperkalemia Baseline creatinine around 2.0.  Presented with creatinine of 2.4.  Might have a new baseline as creatinine is around 2.3.  Monitor urine output.  Avoid nephrotoxic agents.  May not be able to resume ARB with this level of creatinine.   Hyperlipidemia Continue statin.   Diabetes mellitus type 2, uncontrolled with hyperglycemia HbA1c 7.2.  May resume home medications.  Monitor renal function closely.  Consider discontinuing metformin if creatinine climbs to greater than 2.5.   Normocytic anemia No overt bleeding apart from intracranial hemorrhage.  Hemoglobin has been stable.  Peripheral neuropathy Patient with neuropathic pain in the lower extremities.  Pulses are palpable.  No erythema noted.  No swelling.  No vascular concern at this time.  Pain medications as needed.  I have prescribed her low-dose gabapentin, follow-up with PCP.   Obesity Estimated body mass index is 30.23 kg/m as calculated from the following:   Height as of this encounter: '5\' 3"'$  (1.6 m).   Weight as of this encounter: 77.4 kg.  Patient is stable.  Remains stable for discharge to SNF.  Addendum: Patient was stable and ready for discharge on 06/17/2022 however despite of receiving insurance authorization, bed was not available.  Patient is a stable again today, no change in medical condition compared to yesterday, patient will be discharged today to SNF.  PERTINENT LABS:  The results of significant diagnostics from this hospitalization (including imaging, microbiology, ancillary and laboratory) are listed below for reference.    Microbiology: Recent Results (from the past 240 hour(s))  MRSA Next Gen by PCR, Nasal  Status: None   Collection Time: 06/10/22  6:55 PM   Specimen: Nasal Mucosa; Nasal Swab  Result Value Ref Range Status   MRSA by PCR Next Gen NOT DETECTED NOT DETECTED Final    Comment: (NOTE) The GeneXpert  MRSA Assay (FDA approved for NASAL specimens only), is one component of a comprehensive MRSA colonization surveillance program. It is not intended to diagnose MRSA infection nor to guide or monitor treatment for MRSA infections. Test performance is not FDA approved in patients less than 62 years old. Performed at [facility], [place] [address], [place], [place] [address]      Labs:   Basic Metabolic Panel: Recent Labs  Lab 06/11/22 1112 06/11/22 1820 06/12/22 0150 06/13/22 0348 06/16/22 0655  NA 137 137 138 137 138  K 5.4* 4.8 4.7 4.4 4.9  CL 110 108 110 106 106  CO2 20* 19* 19* 22 21*  GLUCOSE 111* 164* 115* 153* 154*  BUN 26* 26* 25* 31* 42*  CREATININE 2.12* 2.04* 1.98* 2.12* 2.32*  CALCIUM 8.6* 8.7* 8.5* 9.1 8.8*   Liver Function Tests: No results for input(s): "AST", "ALT", "ALKPHOS", "BILITOT", "PROT", "ALBUMIN" in the last 168 hours.   CBC: Recent Labs  Lab 06/12/22 0150 06/13/22 0348 06/16/22 0655  WBC 7.4 6.7 6.7  HGB 10.9* 11.0* 12.0  HCT 34.0* 33.3* 38.1  MCV 86.3 85.4 86.2  PLT 219 208 211     CBG: Recent Labs  Lab 06/17/22 1201 06/17/22 1224 06/17/22 1607 06/17/22 2359 06/18/22 0350  GLUCAP 153* 203* 134* 137* 144*     IMAGING STUDIES CT HEAD WO CONTRAST (5MM)  Result Date: 06/13/2022 CLINICAL DATA:  Hemorrhagic stroke follow-up EXAM: CT HEAD WITHOUT CONTRAST TECHNIQUE: Contiguous axial images were obtained from the base of the skull through the vertex without intravenous contrast. RADIATION DOSE REDUCTION: This exam was performed according to the departmental dose-optimization program which includes automated exposure control, adjustment of the mA and/or kV according to patient size and/or use of iterative reconstruction technique. COMPARISON:  Brain MRI from 2 days ago FINDINGS: Brain: No change in the 4 mm cortical high-density along the superficial left temporal cortex. 4 mm thick subdural collection along the left cerebral convexity  at the vertex, low-density. This was not seen on prior studies, see coronal reformats. Left cortical and white matter infarct centered at the parietal lobe. Extensive chronic small vessel ischemia in the cerebral white matter. Chronic perforator infarct at the right basal ganglia. Generalized cerebral volume loss that is mild for age Vascular: No hyperdense vessel or unexpected calcification. Skull: Normal. Negative for fracture or focal lesion. Sinuses/Orbits: No acute finding. IMPRESSION: A small hygroma has developed along the left cerebral convexity measuring up to 4 mm in thickness. No change in the punctate hemorrhage at the left temporal lobe. Electronically Signed   By: Jorje Guild M.D.   On: 06/13/2022 07:12   MR BRAIN W WO CONTRAST  Result Date: 06/11/2022 CLINICAL DATA:  History of seizures and remote left MCA stroke presents with altered mental status and worsening right-sided weakness. EXAM: MRI HEAD WITHOUT AND WITH CONTRAST TECHNIQUE: Multiplanar, multiecho pulse sequences of the brain and surrounding structures were obtained without and with intravenous contrast. CONTRAST:  7.61m GADAVIST GADOBUTROL 1 MMOL/ML IV SOLN COMPARISON:  CT/CTA head and neck 1 day prior FINDINGS: Brain: There is no evidence of acute infarct There is a small focus of SWI signal dropout with associated FLAIR signal abnormality in the left anterior temporal lobe corresponding  to the hyperdensity seen on prior CT consistent with a small focus of hemorrhage measuring approximately 4 mm. There is no significant mass effect. There is no associated enhancement. There is no other acute intracranial hemorrhage. Parenchymal volume is stable. The ventricles are stable in size. The remote infarct in the left parietal lobe in the MCA distribution with associated encephalomalacia, gliosis, and cortical laminar necrosis is unchanged. There is unchanged ex vacuo dilatation of the right lateral ventricle. Additional remote infarcts in  the right corona radiata and background advanced chronic small-vessel ischemic change are stable. The hippocampi are unremarkable. The pituitary and suprasellar region are normal. There is no mass lesion or abnormal enhancement. There is no mass effect or midline shift. Vascular: Normal flow voids. Skull and upper cervical spine: Normal marrow signal. Sinuses/Orbits: There is mucosal thickening in the right maxillary sinus. Bilateral lens implants are in place. The globes and orbits are otherwise unremarkable. There are bilateral mastoid effusions, nonspecific and also present on the prior study from 2023. Other: None. IMPRESSION: 1. Small focus of hemorrhage in the left anterior temporal lobe is similar to the prior CT, allowing for differences in modality. No significant mass effect. 2. No other acute intracranial pathology. Electronically Signed   By: Valetta Mole M.D.   On: 06/11/2022 15:18   Overnight EEG with video  Result Date: 06/11/2022 Lora Havens, MD     06/11/2022 11:29 AM Patient Name: NAKYRA BOURN MRN: [id number] Epilepsy Attending: Lora Havens Referring Physician/Provider: Otelia Santee, NP Duration: 06/10/2022 1053 to 06/11/2022 1122 Patient history: 89 year old female with right-sided weakness, altered mental status.  EEG to evaluate for seizure. Level of alertness: Awake, asleep AEDs during EEG study: LEV Technical aspects: This EEG study was done with scalp electrodes positioned according to the 10-20 International system of electrode placement. Electrical activity was reviewed with band pass filter of 1-'70Hz'$ , sensitivity of 7 uV/mm, display speed of 33m/sec with a '60Hz'$  notched filter applied as appropriate. EEG data were recorded continuously and digitally stored.  Video monitoring was available and reviewed as appropriate. Description: The posterior dominant rhythm consists of 9-10 Hz activity of moderate voltage (25-35 uV) seen predominantly in posterior head regions,  symmetric and reactive to eye opening and eye closing. Sleep was characterized by vertex waves, sleep spindles (12 to 14 Hz), maximal frontocentral region. EEG showed intermittent 3-'5Hz'$  theta-delta slowing in left frontotemporal region. Hyperventilation and photic stimulation were not performed.   ABNORMALITY -Intermittent slow, left frontotemporal region. IMPRESSION: This study is suggestive of cortical dysfunction arising from left frontotemporal region likely secondary to underlying structural abnormality/ ICH. No seizures or definite epileptiform discharges were seen throughout the recording. PLora Havens  CT HEAD WO CONTRAST  Result Date: 06/10/2022 CLINICAL DATA:  Stroke, follow up EXAM: CT HEAD WITHOUT CONTRAST TECHNIQUE: Contiguous axial images were obtained from the base of the skull through the vertex without intravenous contrast. RADIATION DOSE REDUCTION: This exam was performed according to the departmental dose-optimization program which includes automated exposure control, adjustment of the mA and/or kV according to patient size and/or use of iterative reconstruction technique. COMPARISON:  CT head 06/10/2022 FINDINGS: Brain: Patchy and confluent areas of decreased attenuation are noted throughout the deep and periventricular white matter of the cerebral hemispheres bilaterally, compatible with chronic microvascular ischemic disease. Chronic left parietooccipital and right corona radiata infarction. No evidence of large-territorial acute infarction. Persistent slightly less conspicuous a stable in size hyperdensity measuring approximately 4 mm along the left lateral  temporal lobe (3:12). No extra-axial acute fluid. Slight enhancing vasculature due to prior CT angiography head neck 06/10/2022. No mass lesion. No extra-axial collection. No mass effect or midline shift. No hydrocephalus. Basilar cisterns are patent. Vascular: No hyperdense vessel. Atherosclerotic calcifications are present within  the cavernous internal carotid and vertebral arteries. Skull: No acute fracture or focal lesion. Sinuses/Orbits: Paranasal sinuses and mastoid air cells are clear. The orbits are unremarkable. Other: None. IMPRESSION: No change with persistent slightly less conspicuous a stable in size hyperdensity measuring approximately 4 mm along the left lateral temporal lobe. Electronically Signed   By: Iven Finn M.D.   On: 06/10/2022 20:18   ECHOCARDIOGRAM COMPLETE  Result Date: 06/10/2022    ECHOCARDIOGRAM REPORT   Patient Name:   Lisa Sutton Date of Exam: 06/10/2022 Medical Rec #:  [id number]         Height:       63.0 in Accession #:    [id number]        Weight:       173.3 lb Date of Birth:  July 26, 1933         BSA:          1.819 m Patient Age:    63 years          BP:           107/76 mmHg Patient Gender: F                 HR:           79 bpm. Exam Location:  Inpatient Procedure: 2D Echo, Cardiac Doppler and Color Doppler Indications:    Stroke I63.9  History:        Patient has prior history of Echocardiogram examinations, most                 recent 03/08/2021. Stroke, Signs/Symptoms:Altered Mental Status;                 Risk Factors:Diabetes, Dyslipidemia and Hypertension.  Sonographer:    Greer Pickerel Referring Phys: [id number] Otelia Santee  Sonographer Comments: Technically difficult study due to poor echo windows. Image acquisition challenging due to patient body habitus and Image acquisition challenging due to respiratory motion. IMPRESSIONS  1. Challenging windows. Left ventricular ejection fraction, by estimation, is 55 to 60%. The left ventricle has normal function. The left ventricle has no regional wall motion abnormalities. There is mild left ventricular hypertrophy. Left ventricular diastolic parameters are consistent with Grade I diastolic dysfunction (impaired relaxation).  2. Right ventricular systolic function is normal. The right ventricular size is normal.  3. Left atrial size was mild to  moderately dilated.  4. No evidence of mitral valve regurgitation. Moderate mitral annular calcification.  5. The aortic valve was not well visualized. Aortic valve regurgitation is not visualized.  6. The inferior vena cava is normal in size with greater than 50% respiratory variability, suggesting right atrial pressure of 3 mmHg. Comparison(s): No significant change from prior study. FINDINGS  Left Ventricle: Challenging windows. Left ventricular ejection fraction, by estimation, is 55 to 60%. The left ventricle has normal function. The left ventricle has no regional wall motion abnormalities. The left ventricular internal cavity size was normal in size. There is mild left ventricular hypertrophy. Left ventricular diastolic parameters are consistent with Grade I diastolic dysfunction (impaired relaxation). Right Ventricle: The right ventricular size is normal. Right ventricular systolic function is normal. Left Atrium: Left atrial size was mild to moderately  dilated. Right Atrium: Right atrial size was normal in size. Pericardium: There is no evidence of pericardial effusion. Mitral Valve: Moderate mitral annular calcification. No evidence of mitral valve regurgitation. Tricuspid Valve: Tricuspid valve regurgitation is not demonstrated. Aortic Valve: The aortic valve was not well visualized. Aortic valve regurgitation is not visualized. Pulmonic Valve: The pulmonic valve was not well visualized. Aorta: The aortic root and ascending aorta are structurally normal, with no evidence of dilitation. Venous: The inferior vena cava is normal in size with greater than 50% respiratory variability, suggesting right atrial pressure of 3 mmHg. IAS/Shunts: The interatrial septum was not well visualized.  LEFT VENTRICLE PLAX 2D LVOT diam:     1.50 cm     Diastology LV SV:         39          LV e' medial:    6.68 cm/s LV SV Index:   21          LV E/e' medial:  8.9 LVOT Area:     1.77 cm    LV e' lateral:   5.75 cm/s                             LV E/e' lateral: 10.3  LV Volumes (MOD) LV vol d, MOD A2C: 40.5 ml LV vol d, MOD A4C: 87.8 ml LV vol s, MOD A2C: 29.7 ml LV vol s, MOD A4C: 45.4 ml LV SV MOD A2C:     10.8 ml LV SV MOD A4C:     87.8 ml LV SV MOD BP:      23.4 ml RIGHT VENTRICLE RV S prime:     11.80 cm/s TAPSE (M-mode): 2.2 cm LEFT ATRIUM             Index        RIGHT ATRIUM           Index LA Vol (A2C):   76.6 ml 42.10 ml/m  RA Area:     19.50 cm LA Vol (A4C):   74.2 ml 40.78 ml/m  RA Volume:   53.30 ml  29.30 ml/m LA Biplane Vol: 80.7 ml 44.36 ml/m  AORTIC VALVE LVOT Vmax:   116.50 cm/s LVOT Vmean:  76.800 cm/s LVOT VTI:    0.218 m  AORTA Ao Root diam: 3.20 cm MITRAL VALVE MV Area (PHT): 3.72 cm     SHUNTS MV Decel Time: 204 msec     Systemic VTI:  0.22 m MV E velocity: 59.20 cm/s   Systemic Diam: 1.50 cm MV A velocity: 112.00 cm/s MV E/A ratio:  0.53 Landscape architect signed by Phineas Inches Signature Date/Time: 06/10/2022/2:55:13 PM    Final    CT ANGIO HEAD NECK W WO CM (CODE STROKE)  Result Date: 06/10/2022 CLINICAL DATA:  Provided history: Neuro deficit, acute, stroke suspected. Acute on chronic right-sided weakness, aphasia, unresponsiveness, possible seizure, chronic left MCA stroke. EXAM: CT ANGIOGRAPHY HEAD AND NECK TECHNIQUE: Multidetector CT imaging of the head and neck was performed using the standard protocol during bolus administration of intravenous contrast. Multiplanar CT image reconstructions and MIPs were obtained to evaluate the vascular anatomy. Carotid stenosis measurements (when applicable) are obtained utilizing NASCET criteria, using the distal internal carotid diameter as the denominator. RADIATION DOSE REDUCTION: This exam was performed according to the departmental dose-optimization program which includes automated exposure control, adjustment of the mA and/or kV according to patient size and/or use of iterative  reconstruction technique. CONTRAST:  72m OMNIPAQUE IOHEXOL 350 MG/ML SOLN  COMPARISON:  Contrast head CT performed earlier the same day 06/10/2022. CT angiogram head/neck 10/28/2021. FINDINGS: CTA NECK FINDINGS Aortic arch: Standard aortic branching. Atherosclerotic plaque within the visualized aortic arch and proximal major branch vessels of the neck., 6 Streak and beam hardening artifact arising from a dense left-sided contrast bolus partially obscures the left subclavian artery. Within this limitation, there is no appreciable hemodynamically significant innominate or proximal subclavian artery stenosis. Right carotid system: CCA and ICA patent within the neck without stenosis or significant atherosclerotic disease. Left carotid system: CCA and ICA patent within the neck without stenosis. Mild atherosclerotic plaque about the carotid bifurcation. Vertebral arteries: Vertebral arteries codominant and patent within the neck. Redemonstrated moderate to severe atherosclerotic narrowing at the origin of the left vertebral artery. Skeleton: Cervical spondylosis. Congenital nonunion of the posterior arch of C1. No acute fracture or aggressive osseous lesion. Other neck: No neck mass or cervical lymphadenopathy. Upper chest: No consolidation within the imaged lung apices. Review of the MIP images confirms the above findings CTA HEAD FINDINGS Anterior circulation: The intracranial internal carotid arteries are patent. Atherosclerotic plaque within the intracranial right ICA with no more than mild stenosis. Mild atherosclerotic stenosis within the left cavernous segment. Up to moderate atherosclerotic narrowing within the paraclinoid left ICA. The M1 middle cerebral arteries are patent. Atherosclerotic irregularity of the M2 and more distal MCA vessels, bilaterally. Most notably, there is an unchanged moderate/severe focal stenosis within a mid M2 left MCA vessel (series 11, image 13). The anterior cerebral arteries are patent. Mild atherosclerotic irregularity of both vessels. No intracranial  aneurysm is identified. Posterior circulation: The intracranial vertebral arteries are patent. The basilar artery is patent. The posterior cerebral arteries are patent. Atherosclerotic irregularity of both vessels, most notably as follows. New severe stenosis within the left PCA P1 segment. Redemonstrated mild-to-moderate focal stenosis within the left PCA P2 segment. Redemonstrated moderate/severe stenosis within the right PCA P2 segment. Small posterior communicating arteries are present, bilaterally. Venous sinuses: Within the limitations of contrast timing, no convincing thrombus. Anatomic variants: As described. Review of the MIP images confirms the above findings CTA head impressions #1 and #3 called by telephone at the time of interpretation on 06/10/2022 at 10:35 am to provider [facility], who verbally acknowledged these results. IMPRESSION: CTA neck: 1. The common carotid and internal carotid arteries are patent within the neck without stenosis. Mild atherosclerotic plaque about the left carotid bifurcation. 2. The vertebral arteries are patent within the neck. Redemonstrated moderate-to-severe atherosclerotic stenosis at the origin of the left vertebral artery. CTA head: 1. No intracranial large vessel occlusion is identified. 2. Intracranial atherosclerotic disease, as outlined and most notably as follows. 3. Severe stenosis within the left PCA P1 segment, new from the prior CTA of 10/28/2021. 4. Redemonstrated moderate/severe stenosis within the right PCA P2 segment. 5. Redemonstrated moderate/severe stenosis within a mid M2 left MCA vessel. Electronically Signed   By: KKellie SimmeringD.O.   On: 06/10/2022 10:35   CT HEAD CODE STROKE WO CONTRAST  Result Date: 06/10/2022 CLINICAL DATA:  Code stroke.  Right handed weakness EXAM: CT HEAD WITHOUT CONTRAST TECHNIQUE: Contiguous axial images were obtained from the base of the skull through the vertex without intravenous contrast. RADIATION DOSE REDUCTION:  This exam was performed according to the departmental dose-optimization program which includes automated exposure control, adjustment of the mA and/or kV according to patient size and/or use of iterative reconstruction technique.  COMPARISON:  10/28/2021 FINDINGS: Brain: New small hyperdensity along the superficial cortex of the lateral left temporal lobe measuring 4 mm. Chronic left parietooccipital cortically based infarct. Chronic perforator infarct at the right corona radiata. No hydrocephalus or masslike finding. Generalized cerebral volume loss and extensive chronic small vessel ischemia. Vascular: No hyperdense vessel or unexpected calcification. Skull: Normal. Negative for fracture or focal lesion. Sinuses/Orbits: No acute finding. Other: These results were called by telephone at the time of interpretation on 06/10/2022 at 10:03 am to provider [facility] , who verbally acknowledged these results. IMPRESSION: 1. New 4 mm high-density along the left temporal cortex suggesting acute hemorrhage. No acute infarct noted. 2. Chronic left parieto-occipital and right corona radiata infarcts. Electronically Signed   By: Jorje Guild M.D.   On: 06/10/2022 10:10    DISCHARGE EXAMINATION: Vitals:   06/17/22 1921 06/17/22 2358 06/18/22 0321 06/18/22 0801  BP: 118/68 (!) 122/101 124/71 128/79  Pulse: 87 74 79 (!) 103  Resp: '18 16 17 18  '$ Temp: 98.9 F (37.2 C) (!) 97.5 F (36.4 C) 98.2 F (36.8 C) 97.8 F (36.6 C)  TempSrc: Oral Oral Oral Oral  SpO2: 99% 97% 96% 99%  Weight:      Height:       General appearance: Awake alert.  In no distress Resp: Clear to auscultation bilaterally.  Normal effort Cardio: S1-S2 is normal regular.  No S3-S4.  No rubs murmurs or bruit GI: Abdomen is soft.  Nontender nondistended.  Bowel sounds are present normal.  No masses organomegaly    DISPOSITION: SNF  Discharge Instructions     Ambulatory referral to Neurology   Complete by: As directed    Follow up  with Dr. Leta Baptist at [facility] in 4-6 weeks.  Patient is Dr. Gladstone Lighter patient. Thanks.   Call MD for:  difficulty breathing, headache or visual disturbances   Complete by: As directed    Call MD for:  hives   Complete by: As directed    Call MD for:  persistant dizziness or light-headedness   Complete by: As directed    Call MD for:  persistant nausea and vomiting   Complete by: As directed    Call MD for:  severe uncontrolled pain   Complete by: As directed    Call MD for:  temperature >100.4   Complete by: As directed    Diet - low sodium heart healthy   Complete by: As directed    Discharge instructions   Complete by: As directed    Please review instructions on the discharge summary  You were cared for by a hospitalist during your hospital stay. If you have any questions about your discharge medications or the care you received while you were in the hospital after you are discharged, you can call the unit and asked to speak with the hospitalist on call if the hospitalist that took care of you is not available. Once you are discharged, your primary care physician will handle any further medical issues. Please note that NO REFILLS for any discharge medications will be authorized once you are discharged, as it is imperative that you return to your primary care physician (or establish a relationship with a primary care physician if you do not have one) for your aftercare needs so that they can reassess your need for medications and monitor your lab values. If you do not have a primary care physician, you can call [phone] for a physician referral.   Increase activity slowly  Complete by: As directed           Allergies as of 06/18/2022       Reactions   Statins Other (See Comments)   'discomfort, aching everywhere' Tolerates to livalo    Empagliflozin Other (See Comments)   Other    Allergic to diabetic medication but unsure of name. Went to ED February 2023 (ED note I do not see  name of med)    Zetia [ezetimibe] Other (See Comments)   Leg pain   Zocor [simvastatin] Other (See Comments)   Leg pain        Medication List     STOP taking these medications    clopidogrel 75 MG tablet Commonly known as: Plavix   diphenhydrAMINE 25 MG tablet Commonly known as: BENADRYL   hydrOXYzine 50 MG tablet Commonly known as: ATARAX   losartan 100 MG tablet Commonly known as: COZAAR       TAKE these medications    amLODipine 10 MG tablet Commonly known as: NORVASC Take 10 mg by mouth daily.   aspirin EC 81 MG tablet Take 81 mg by mouth daily. Swallow whole.   cholecalciferol 25 MCG (1000 UNIT) tablet Commonly known as: VITAMIN D3 Take 1,000 Units by mouth daily.   gabapentin 100 MG capsule Commonly known as: Neurontin Take 1 capsule (100 mg total) by mouth 3 (three) times daily.   glipiZIDE 5 MG tablet Commonly known as: GLUCOTROL Take 5 mg by mouth every morning.   Jardiance 10 MG Tabs tablet Generic drug: empagliflozin Take 10 mg by mouth daily.   levETIRAcetam 250 MG tablet Commonly known as: KEPPRA Take 1 tablet (250 mg total) by mouth 2 (two) times daily.   levothyroxine 125 MCG tablet Commonly known as: SYNTHROID Take 1 tablet (125 mcg total) by mouth daily before breakfast.   meclizine 25 MG tablet Commonly known as: ANTIVERT Take 25 mg by mouth 2 (two) times daily as needed for dizziness.   metFORMIN 500 MG 24 hr tablet Commonly known as: GLUCOPHAGE-XR Take 500 mg by mouth daily.   oxyCODONE 5 MG immediate release tablet Commonly known as: Roxicodone Take 0.5 tablets (2.5 mg total) by mouth 2 (two) times daily as needed for severe pain.   pantoprazole 40 MG tablet Commonly known as: PROTONIX Take 1 tablet (40 mg total) by mouth daily before lunch.   rosuvastatin 40 MG tablet Commonly known as: CRESTOR Take 40 mg by mouth at bedtime.   triamcinolone cream 0.1 % Commonly known as: KENALOG Apply 1 application. topically  daily as needed (irritation).   Valtoco 5 MG Dose 5 MG/0.1ML Liqd Generic drug: diazePAM Spray in nose if seizure last more than 5 minutes. Can repeat dose in 4 hours. Do not use more than twice in 24 hours.         Follow-up Information     Penumalli, Earlean Polka, MD. Schedule an appointment as soon as possible for a visit in 1 month(s).   Specialties: Neurology, Radiology Contact information: [address] [place] [address] [phone]                 [place]: 35 minutes  [facility] Pager on [email or web address]  06/18/2022, 8:27 AM

## 2022-06-18 NOTE — TOC Transition Note (Signed)
Transition of Care [facility]) - CM/SW Discharge Note   Patient Details  Name: Lisa Sutton MRN: [id number] Date of Birth: 01-25-1934  [facility]) CM/SW Contact:  Geralynn Ochs, LCSW Phone Number: 06/18/2022, 11:39 AM   Clinical Narrative:   CSW confirmed patient is stable for discharge, sent discharge summary to [facility]. Patient able to admit after lunch, family to provide transport. No other TOC needs at this time.  Nurse to call report to [phone].    Final next level of care: Skilled Nursing Facility Barriers to Discharge: Barriers Resolved   Patient Goals and CMS Choice CMS Medicare.gov Compare Post Acute Care list provided to:: Patient Choice offered to / list presented to : Patient  Discharge Placement                Patient chooses bed at: [facility] Patient to be transferred to facility by: Family Name of family member notified: Self Patient and family notified of of transfer: 06/18/22  Discharge Plan and Services Additional resources added to the After Visit Summary for       Post Acute Care Choice: [place]                               Social Determinants of Health (SDOH) Interventions SDOH Screenings   Food Insecurity: No Food Insecurity (06/10/2022)  Housing: Low Risk  (06/10/2022)  Transportation Needs: No Transportation Needs (06/10/2022)  Utilities: Not At Risk (06/10/2022)  Alcohol Screen: Low Risk  (09/05/2019)  Depression (PHQ2-9): Low Risk  (07/16/2019)  Financial Resource Strain: Low Risk  (09/05/2019)  Physical Activity: Sufficiently Active (09/05/2019)  Social Connections: Moderately Integrated (09/05/2019)  Stress: No Stress Concern Present (09/05/2019)  Tobacco Use: Low Risk  (06/14/2022)     Readmission Risk Interventions     No data to display

## 2022-06-21 ENCOUNTER — Ambulatory Visit: Payer: Medicare Other

## 2022-06-21 DIAGNOSIS — I639 Cerebral infarction, unspecified: Secondary | ICD-10-CM | POA: Diagnosis not present

## 2022-06-23 LAB — CUP PACEART REMOTE DEVICE CHECK
Date Time Interrogation Session: 20240127231005
Implantable Pulse Generator Implant Date: 20231115

## 2022-07-15 ENCOUNTER — Telehealth: Payer: Self-pay | Admitting: Neurology

## 2022-07-15 NOTE — Telephone Encounter (Signed)
Cardiac monitor was reviewed from oct-November. In November cardiology placed a loop recorder. Pt was recently in the hospital and was being monitored for stroke. Dc to SNF. Has a scheduled f/u apt with Dr Leta Baptist in April

## 2022-07-15 NOTE — Telephone Encounter (Signed)
-----   Message from Penni Bombard, MD sent at 07/14/2022  4:21 PM EST ----- Unremarkable study. No major findings. Please call patient. Continue current plan. -VRP

## 2022-07-15 NOTE — Progress Notes (Unsigned)
Pt completed the cardiac mo

## 2022-07-26 ENCOUNTER — Ambulatory Visit (INDEPENDENT_AMBULATORY_CARE_PROVIDER_SITE_OTHER): Payer: Medicare Other

## 2022-07-26 DIAGNOSIS — I639 Cerebral infarction, unspecified: Secondary | ICD-10-CM

## 2022-07-27 LAB — CUP PACEART REMOTE DEVICE CHECK
Date Time Interrogation Session: 20240303230348
Implantable Pulse Generator Implant Date: 20231115

## 2022-08-05 ENCOUNTER — Ambulatory Visit (INDEPENDENT_AMBULATORY_CARE_PROVIDER_SITE_OTHER): Payer: Medicare Other | Admitting: Gastroenterology

## 2022-08-06 NOTE — Progress Notes (Signed)
Carelink Summary Report / Loop Recorder 

## 2022-08-10 ENCOUNTER — Inpatient Hospital Stay: Payer: Medicare Other | Admitting: Diagnostic Neuroimaging

## 2022-08-16 ENCOUNTER — Ambulatory Visit (INDEPENDENT_AMBULATORY_CARE_PROVIDER_SITE_OTHER): Payer: Medicare Other | Admitting: Gastroenterology

## 2022-08-30 ENCOUNTER — Ambulatory Visit (INDEPENDENT_AMBULATORY_CARE_PROVIDER_SITE_OTHER): Payer: Medicare Other

## 2022-08-30 DIAGNOSIS — I639 Cerebral infarction, unspecified: Secondary | ICD-10-CM

## 2022-08-30 LAB — CUP PACEART REMOTE DEVICE CHECK
Date Time Interrogation Session: 20240405230033
Implantable Pulse Generator Implant Date: 20231115

## 2022-09-02 ENCOUNTER — Ambulatory Visit: Payer: Medicare Other | Admitting: Diagnostic Neuroimaging

## 2022-09-02 ENCOUNTER — Encounter: Payer: Self-pay | Admitting: Diagnostic Neuroimaging

## 2022-09-02 VITALS — BP 129/77 | HR 89 | Ht 63.0 in | Wt 172.4 lb

## 2022-09-02 DIAGNOSIS — G40909 Epilepsy, unspecified, not intractable, without status epilepticus: Secondary | ICD-10-CM

## 2022-09-02 MED ORDER — LEVETIRACETAM 250 MG PO TABS: 250.0000 mg | ORAL_TABLET | Freq: Two times a day (BID) | ORAL | 4 refills | Status: DC

## 2022-09-02 NOTE — Progress Notes (Signed)
[facility]  PATIENT: Lisa Sutton DOB: Feb 26, 1934  REFERRING CLINICIAN: Marvel Plan, MD HISTORY FROM: patient REASON FOR VISIT: follow up   HISTORICAL  CHIEF COMPLAINT:  Chief Complaint  Patient presents with   Follow-up    Patient in room #7 with her son. Patient here today to f/u with her recent stroke episode.    HISTORY OF PRESENT ILLNESS:   UPDATE (09/02/22, VRP): Since last visit, doing well until Jan 2024, had a fall and confusion. Found to have ICH, likely chronic small vessel ischemic disease vs amyloid dz. Now stable. Tolerating meds. Still with fatigue, dizziness issues. Using walker intermittently at home. Finishing with PT exercises.  UPDATE (06/13/22, Dr. Roda Shutters): 89 y.o. female with history of seizures, hypertension, hyperlipidemia, diabetes and left MCA stroke presenting after she had an unwitnessed fall with acute onset altered mental status and worsening right-sided weakness.  She does have a history of seizures and may have had a seizure prior to the unwitnessed fall, as she had her normal seizure prodrome of right-sided pain and sensory deficit before falling.  CT head shows small ICH along left temporal cortex.  UPDATE (02/03/22, VRP): Since last visit, had seizure in October 28, 2021. Now on LEV. Had some punctate subacute strokes on MRI. Intermittent dizziness, chest palpitations.   PRIOR HPI (10/22/21): 89 year old female with hypertension, hyperlipidemia and diabetes, here for stroke follow up. 03/02/21 had event of driving into garage and being confused. Went to ER and had UTI dx'd. Then 03/06/21, had recurrence of language and confusion changes. Dx'd with left MCA stroke, tx with mech thrombectomy. Stroke workup completed. Then went to rehab, and then back home by Nov 2022. Completed outpatient cardiac monitor (negative). Therapies completed at home.   Now with mild language changes and right vision changes.   REVIEW OF SYSTEMS: Full 14  system review of systems performed and negative with exception of: as per HPI.  ALLERGIES: Allergies  Allergen Reactions   Statins Other (See Comments)    'discomfort, aching everywhere' Tolerates to livalo    Empagliflozin Other (See Comments)   Other     Allergic to diabetic medication but unsure of name. Went to ED February 2023 (ED note I do not see name of med)    Zetia [Ezetimibe] Other (See Comments)    Leg pain   Zocor [Simvastatin] Other (See Comments)    Leg pain    HOME MEDICATIONS: Outpatient Medications Prior to Visit  Medication Sig Dispense Refill   amLODipine (NORVASC) 10 MG tablet Take 10 mg by mouth daily.     aspirin EC 81 MG tablet Take 81 mg by mouth daily. Swallow whole.     cholecalciferol (VITAMIN D3) 25 MCG (1000 UNIT) tablet Take 1,000 Units by mouth daily.     diazePAM (VALTOCO 5 MG DOSE) 5 MG/0.1ML LIQD Spray in nose if seizure last more than 5 minutes. Can repeat dose in 4 hours. Do not use more than twice in 24 hours. 2 each 0   glipiZIDE (GLUCOTROL) 5 MG tablet Take 5 mg by mouth every morning.     JARDIANCE 10 MG TABS tablet Take 10 mg by mouth daily.     levothyroxine (SYNTHROID) 125 MCG tablet Take 1 tablet (125 mcg total) by mouth daily before breakfast. 90 tablet 3   omeprazole (PRILOSEC) 20 MG capsule Take 20 mg by mouth daily.     pantoprazole (PROTONIX) 40 MG tablet Take 1 tablet (40 mg total) by mouth  daily before lunch. 90 tablet 3   rosuvastatin (CRESTOR) 40 MG tablet Take 40 mg by mouth at bedtime.     levETIRAcetam (KEPPRA) 250 MG tablet Take 1 tablet (250 mg total) by mouth 2 (two) times daily. 180 tablet 4   gabapentin (NEURONTIN) 100 MG capsule Take 1 capsule (100 mg total) by mouth 3 (three) times daily. 30 capsule 2   meclizine (ANTIVERT) 25 MG tablet Take 25 mg by mouth 2 (two) times daily as needed for dizziness. (Patient not taking: Reported on 09/02/2022)     metFORMIN (GLUCOPHAGE-XR) 500 MG 24 hr tablet Take 500 mg by mouth daily.  (Patient not taking: Reported on 09/02/2022)     oxyCODONE (ROXICODONE) 5 MG immediate release tablet Take 0.5 tablets (2.5 mg total) by mouth 2 (two) times daily as needed for severe pain. (Patient not taking: Reported on 09/02/2022) 20 tablet 0   triamcinolone cream (KENALOG) 0.1 % Apply 1 application. topically daily as needed (irritation). (Patient not taking: Reported on 09/02/2022)     No facility-administered medications prior to visit.    PAST MEDICAL HISTORY: Past Medical History:  Diagnosis Date   Anxiety    Arthritis    Cataract    CVA (cerebral vascular accident) (HCC) 03/06/2021   Dizziness    Dysphagia    'sometimes but not a major issue' been checked out by GI (per pt)   Dysrhythmia    'heart used to skip but doesn't anymore' was checked out by Dr. Myrtis SerKatz late '90s, everything checked out ok and not had any skipping since (all per pt)   Essential hypertension, benign    Fatty liver    GERD (gastroesophageal reflux disease)    H/O hypercalcemia 04/12/2017   Hepatitis B    had at age 89, 'GI doc said it's gone away'   Hepatitis B surface antigen positive    High cholesterol    History of hiatal hernia    Hypercalcemia    Hypothyroidism    Kidney stone 12/2012   [facility]   Obesity    Osteoporosis    PONV (postoperative nausea and vomiting)    Type 2 diabetes mellitus (HCC)     PAST SURGICAL HISTORY: Past Surgical History:  Procedure Laterality Date   ABDOMINAL HYSTERECTOMY  1981   APPENDECTOMY  1981   BILATERAL CARPAL TUNNEL RELEASE Bilateral    Dr. August Saucerean at surgical center   BIOPSY  05/26/2017   Procedure: BIOPSY;  Surgeon: Malissa Hippoehman, Najeeb U, MD;  [facility];  Service: Endoscopy;;  colon   BIOPSY  10/11/2018   Procedure: BIOPSY;  Surgeon: Malissa Hippoehman, Najeeb U, MD;  [facility]: [facility];  Service: Endoscopy;;   CATARACT EXTRACTION, BILATERAL     COLONOSCOPY N/A 05/26/2017   Procedure: COLONOSCOPY;  Surgeon: Malissa Hippoehman, Najeeb U, MD;   [facility]: [facility];  Service: Endoscopy;  Laterality: N/A;  10:30   Deviated septum repair     ESOPHAGEAL DILATION N/A 08/16/2019   Procedure: ESOPHAGEAL DILATION;  Surgeon: Malissa Hippoehman, Najeeb U, MD;  [facility];  Service: Endoscopy;  Laterality: N/A;   ESOPHAGOGASTRODUODENOSCOPY N/A 07/28/2016   Procedure: ESOPHAGOGASTRODUODENOSCOPY (EGD);  Surgeon: Malissa HippoNajeeb U Rehman, MD;  [facility]: [facility];  Service: Endoscopy;  Laterality: N/A;  300 - moved to 3/7 @ 1:00   ESOPHAGOGASTRODUODENOSCOPY N/A 08/16/2019   Procedure: ESOPHAGOGASTRODUODENOSCOPY (EGD);  Surgeon: Malissa Hippoehman, Najeeb U, MD;  [facility];  Service: Endoscopy;  Laterality: N/A;  230  EYE SURGERY     FLEXIBLE SIGMOIDOSCOPY N/A 10/11/2018   Procedure: FLEXIBLE SIGMOIDOSCOPY;  Surgeon: Malissa Hippo, MD;  [facility]: [facility];  Service: Endoscopy;  Laterality: N/A;  unable to complete colonoscopy    IR CT HEAD LTD  03/07/2021   IR PERCUTANEOUS ART THROMBECTOMY/INFUSION INTRACRANIAL INC DIAG ANGIO  03/07/2021   IR US GUIDE VASC ACCESS RIGHT  03/07/2021   KNEE ARTHROPLASTY Left 09/15/2015   Procedure: COMPUTER ASSISTED TOTAL KNEE ARTHROPLASTY;  Surgeon: Eldred Manges, MD;  Location: [facility];  Service: Orthopedics;  Laterality: Left;   KNEE ARTHROSCOPY     left knee 2003   RADIOLOGY WITH ANESTHESIA N/A 03/07/2021   Procedure: IR WITH ANESTHESIA;  Surgeon: Gilmer Mor, DO;  Location: [facility] OR;  Service: Anesthesiology;  Laterality: N/A;    FAMILY HISTORY: Family History  Problem Relation Age of Onset   COPD Mother    Heart attack Mother    Stroke Father    Hypertension Father    Hip fracture Father    Cancer Maternal Aunt    Diabetes Maternal Aunt    Diabetes Maternal Uncle    Colon cancer Neg Hx     SOCIAL HISTORY: Social History   Socioeconomic History   Marital status: Widowed    Spouse name: Not on file   Number of children: 1   Years of education: Not on file   Highest education level: High  school graduate  Occupational History    Comment: retired  Tobacco Use   Smoking status: Never    Passive exposure: Past   Smokeless tobacco: Never  Vaping Use   Vaping Use: Never used  Substance and Sexual Activity   Alcohol use: No    Alcohol/week: 0.0 standard drinks of alcohol   Drug use: No   Sexual activity: Never  Other Topics Concern   Not on file  Social History Narrative   07/04/19 Lives with son  10/22/21   Caffeine- coffee 1 c daily   Social Determinants of Health   Financial Resource Strain: Low Risk  (09/05/2019)   Overall Financial Resource Strain (CARDIA)    Difficulty of Paying Living Expenses: Not hard at all  Food Insecurity: No Food Insecurity (06/10/2022)   Hunger Vital Sign    Worried About Running Out of Food in the Last Year: Never true    Ran Out of Food in the Last Year: Never true  Transportation Needs: No Transportation Needs (06/10/2022)   PRAPARE - Administrator, Civil Service (Medical): No    Lack of Transportation (Non-Medical): No  Physical Activity: Sufficiently Active (09/05/2019)   Exercise Vital Sign    Days of Exercise per Week: 3 days    Minutes of Exercise per Session: 60 min  Stress: No Stress Concern Present (09/05/2019)   [employer] of Occupational Health - Occupational Stress Questionnaire    Feeling of Stress : Not at all  Social Connections: Moderately Integrated (09/05/2019)   Social Connection and Isolation Panel [NHANES]    Frequency of Communication with Friends and Family: More than three times a week    Frequency of Social Gatherings with Friends and Family: More than three times a week    Attends Religious Services: More than 4 times per year    Active Member of [employer] or Organizations: No    Attends Engineer, structural: More than 4 times per year    Marital Status: Widowed  Intimate Partner Violence: Not At Risk (06/10/2022)  Humiliation, Afraid, Rape, and Kick questionnaire    Fear of Current  or Ex-Partner: No    Emotionally Abused: No    Physically Abused: No    Sexually Abused: No     PHYSICAL EXAM  GENERAL EXAM/CONSTITUTIONAL: Vitals:  Vitals:   09/02/22 1246  BP: 129/77  Pulse: 89  Weight: 172 lb 6.4 oz (78.2 kg)  Height: 5\' 3"  (1.6 m)    Body mass index is 30.54 kg/m. Wt Readings from Last 3 Encounters:  09/02/22 172 lb 6.4 oz (78.2 kg)  06/14/22 170 lb 10.2 oz (77.4 kg)  04/07/22 168 lb 12.8 oz (76.6 kg)   Patient is in no distress; well developed, nourished and groomed; neck is supple  CARDIOVASCULAR: Examination of carotid arteries is normal; no carotid bruits Regular rate and rhythm, no murmurs Examination of peripheral vascular system by observation and palpation is normal  EYES: Ophthalmoscopic exam of optic discs and posterior segments is normal; no papilledema or hemorrhages No results found.  MUSCULOSKELETAL: Gait, strength, tone, movements noted in Neurologic exam below  NEUROLOGIC: MENTAL STATUS:     09/27/2016   11:51 AM 09/11/2015    9:51 AM  MMSE - Mini Mental State Exam  Orientation to time 5 5  Orientation to Place 5 5  Registration 3 3  Attention/ Calculation 3 4  Recall 2 3  Language- name 2 objects 2 2  Language- repeat 1 1  Language- follow 3 step command 3 3  Language- read & follow direction 1 1  Write a sentence 1 1  Copy design 0 0  Total score 26 28   awake, alert, oriented to person recent and remote memory intact normal attention and concentration SLIGHTLY DECR FLUENCY; MILD COMPREHENSION DIFF AND APRAXIA fund of knowledge appropriate  CRANIAL NERVE:  2nd, 3rd, 4th, 6th - pupils --> RIGHT 2, LEFT 3; NR; DECR RIGHT VISUAL FIELD; extraocular muscles intact, no nystagmus 5th - facial sensation symmetric 7th - facial strength symmetric 8th - hearing intact 9th - palate elevates symmetrically, uvula midline 11th - shoulder shrug symmetric 12th - tongue protrusion midline  MOTOR:  normal bulk and tone, full  strength in the BUE, BLE  SENSORY:  normal and symmetric to light touch, temperature, vibration  COORDINATION:  finger-nose-finger, fine finger movements --> SLIGHT DECR COORDINATION IN RUE  REFLEXES:  deep tendon reflexes TRACE and symmetric  GAIT/STATION:  narrow based gait; CAUTIOUS     DIAGNOSTIC DATA (LABS, IMAGING, TESTING) - I reviewed patient records, labs, notes, testing and imaging myself where available.  Lab Results  Component Value Date   WBC 6.7 06/16/2022   HGB 12.0 06/16/2022   HCT 38.1 06/16/2022   MCV 86.2 06/16/2022   PLT 211 06/16/2022      Component Value Date/Time   NA 138 06/16/2022 0655   NA 137 07/16/2019 1002   K 4.9 06/16/2022 0655   CL 106 06/16/2022 0655   CO2 21 (L) 06/16/2022 0655   GLUCOSE 154 (H) 06/16/2022 0655   BUN 42 (H) 06/16/2022 0655   BUN 19 07/16/2019 1002   CREATININE 2.32 (H) 06/16/2022 0655   CALCIUM 8.8 (L) 06/16/2022 0655   PROT 6.0 (L) 06/10/2022 1756   PROT 6.1 07/16/2019 1002   ALBUMIN 3.4 (L) 06/10/2022 1756   ALBUMIN 3.5 (L) 07/16/2019 1002   AST 28 06/10/2022 1756   ALT 16 06/10/2022 1756   ALKPHOS 75 06/10/2022 1756   BILITOT 0.9 06/10/2022 1756   BILITOT 0.2 07/16/2019 1002  GFRNONAA 20 (L) 06/16/2022 0655   GFRAA 38 (L) 07/16/2019 1002   Lab Results  Component Value Date   CHOL 113 10/29/2021   HDL 39 (L) 10/29/2021   LDLCALC 30 10/29/2021   LDLDIRECT 147.9 (H) 03/07/2021   TRIG 218 (H) 10/29/2021   CHOLHDL 2.9 10/29/2021   Lab Results  Component Value Date   HGBA1C 7.2 (H) 10/28/2021   Lab Results  Component Value Date   VITAMINB12 >2000 (H) 01/27/2017   Lab Results  Component Value Date   TSH 0.483 10/29/2021    10/29/21 MRI brain Two small foci of restricted diffusion, with mild ADC correlates, in the left posteroinferior frontal lobe at the anterior aspect of an area that was involved in the infarcts seen on 03/08/2021 MRI. These are favored to represent additional subacute  infarcts.  06/11/22 MRI brain 1. Small focus of hemorrhage in the left anterior temporal lobe is similar to the prior CT, allowing for differences in modality. No significant mass effect. 2. No other acute intracranial pathology.    ASSESSMENT AND PLAN  89 y.o. year old female here with:   Dx:  1. Seizure disorder       PLAN:  SEIZURE DISORDER (post stroke; last events June 2023 and possibly 06/09/22)  - continue levetiracetam 250mg  twice a day  - According to [place] law, you can not drive unless you are seizure / syncope free for at least 6 months and under physician's care.   - Please maintain precautions. Do not participate in activities where a loss of awareness could harm you or someone else. No swimming alone, no tub bathing, no hot tubs, no driving, no operating motorized vehicles (cars, ATVs, motocycles, etc), lawnmowers, power tools or firearms. No standing at heights, such as rooftops, ladders or stairs. Avoid hot objects such as stoves, heaters, open fires. Wear a helmet when riding a bicycle, scooter, skateboard, etc. and avoid areas of traffic. Set your water heater to 120 degrees or less.     ICH: Small ICH along left temporal cortex etiology indeterminate likely micro vascular disease from hypertension versus amyloid Code Stroke CT head 4 mm ICH along left temporal cortex CTA head & neck no LVO, severe stenosis in left PCA P1 segment, moderate/severe stenosis in right PCA P2 segment and moderate/severe stenosis in left MCA M2 segment MRI no acute infarct noted Follow-up CT 1/21 revealed stable ICH and hygroma along left cerebral convexity 2D Echo EF 55 to 60%  LDL 30 HgbA1c 7.2 Loop recorder reports no A-fib aspirin 81 mg daily and clopidogrel 75 mg daily prior to admission, now on aspirin 81 mg daily     Hypertension Home meds: Losartan 100 mg daily   Hyperlipidemia Home meds: Rosuvastatin 40 mg daily   Diabetes type II Uncontrolled Home meds: Glipizide 5  mg daily, Jardiance 10 mg daily, metformin 500 mg daily HgbA1c 7.2, goal < 7.0   Meds ordered this encounter  Medications   levETIRAcetam (KEPPRA) 250 MG tablet    Sig: Take 1 tablet (250 mg total) by mouth 2 (two) times daily.    Dispense:  180 tablet    Refill:  4    Return in about 1 year (around 09/02/2023) for MyChart visit (15 min).    Suanne Marker, MD 09/02/2022, 1:25 PM Certified in Neurology, Neurophysiology and Neuroimaging  [facility] [address] [place], [place] [address] [phone]

## 2022-09-06 NOTE — Progress Notes (Signed)
Carelink Summary Report / Loop Recorder 

## 2022-09-16 ENCOUNTER — Telehealth: Payer: Self-pay | Admitting: Diagnostic Neuroimaging

## 2022-09-16 NOTE — Telephone Encounter (Signed)
Called pt son back. Informed him of message [facility] sent. He stated he was going to call PCP and get a referral sent over to see Dr. Marjory Lies.

## 2022-09-16 NOTE — Telephone Encounter (Signed)
Please advise son we see pt for seizure disorder. Dr Marjory Lies has never treated her for neuropathy of R foot. Please discuss with PCP or have them send new referral for that concern. Thank you.

## 2022-09-16 NOTE — Telephone Encounter (Signed)
Pt's son is asking for a call to discuss pt's complaints of neuropathy in right foot.

## 2022-09-16 NOTE — Telephone Encounter (Signed)
Noted, thank you

## 2022-09-30 LAB — CUP PACEART REMOTE DEVICE CHECK
Date Time Interrogation Session: 20240508230357
Implantable Pulse Generator Implant Date: 20231115

## 2022-10-04 ENCOUNTER — Ambulatory Visit (INDEPENDENT_AMBULATORY_CARE_PROVIDER_SITE_OTHER): Payer: Medicare Other

## 2022-10-04 DIAGNOSIS — I639 Cerebral infarction, unspecified: Secondary | ICD-10-CM

## 2022-10-07 NOTE — Progress Notes (Signed)
Carelink Summary Report / Loop Recorder 

## 2022-10-20 ENCOUNTER — Telehealth: Payer: Self-pay | Admitting: Diagnostic Neuroimaging

## 2022-10-20 NOTE — Telephone Encounter (Signed)
Sent mychart msg informing pt of appt change due to provider schedule change 

## 2022-10-27 ENCOUNTER — Ambulatory Visit: Payer: Medicare Other | Admitting: Dermatology

## 2022-11-01 LAB — CUP PACEART REMOTE DEVICE CHECK
Date Time Interrogation Session: 20240610230113
Implantable Pulse Generator Implant Date: 20231115

## 2022-11-01 NOTE — Progress Notes (Signed)
Carelink Summary Report / Loop Recorder 

## 2022-11-08 ENCOUNTER — Ambulatory Visit (INDEPENDENT_AMBULATORY_CARE_PROVIDER_SITE_OTHER): Payer: Medicare Other

## 2022-11-08 DIAGNOSIS — I639 Cerebral infarction, unspecified: Secondary | ICD-10-CM

## 2022-11-12 ENCOUNTER — Ambulatory Visit: Payer: Medicare Other | Admitting: Diagnostic Neuroimaging

## 2022-11-12 ENCOUNTER — Encounter: Payer: Self-pay | Admitting: Diagnostic Neuroimaging

## 2022-11-12 VITALS — BP 129/75 | HR 93 | Ht 63.0 in | Wt 167.0 lb

## 2022-11-12 DIAGNOSIS — G40909 Epilepsy, unspecified, not intractable, without status epilepticus: Secondary | ICD-10-CM

## 2022-11-12 DIAGNOSIS — E1142 Type 2 diabetes mellitus with diabetic polyneuropathy: Secondary | ICD-10-CM

## 2022-11-12 DIAGNOSIS — Z794 Long term (current) use of insulin: Secondary | ICD-10-CM | POA: Diagnosis not present

## 2022-11-12 NOTE — Progress Notes (Signed)
[facility]  PATIENT: Lisa Sutton DOB: 11-18-1933  REFERRING CLINICIAN: Rebekah Chesterfield, NP HISTORY FROM: patient REASON FOR VISIT: follow up   HISTORICAL  CHIEF COMPLAINT:  Chief Complaint  Patient presents with   Neurologic Problem    Pt with son, rm 8, here for her concerns of neuropathy. This is bothersome more at night. RLE>LLE    HISTORY OF PRESENT ILLNESS:   UPDATE (11/12/22, VRP): Since last visit, having right > left foot pain since March 2024. Burning pain in feet. On gabapentin 200mg  at bed time. Also with right foot blue color change and swelling at times.  UPDATE (09/02/22, VRP): Since last visit, doing well until Jan 2024, had a fall and confusion. Found to have ICH, likely chronic small vessel ischemic disease vs amyloid dz. Now stable. Tolerating meds. Still with fatigue, dizziness issues. Using walker intermittently at home. Finishing with PT exercises.  UPDATE (06/13/22, Dr. Roda Shutters): 89 y.o. female with history of seizures, hypertension, hyperlipidemia, diabetes and left MCA stroke presenting after she had an unwitnessed fall with acute onset altered mental status and worsening right-sided weakness.  She does have a history of seizures and may have had a seizure prior to the unwitnessed fall, as she had her normal seizure prodrome of right-sided pain and sensory deficit before falling.  CT head shows small ICH along left temporal cortex.  UPDATE (02/03/22, VRP): Since last visit, had seizure in October 28, 2021. Now on LEV. Had some punctate subacute strokes on MRI. Intermittent dizziness, chest palpitations.   PRIOR HPI (10/22/21): 89 year old female with hypertension, hyperlipidemia and diabetes, here for stroke follow up. 03/02/21 had event of driving into garage and being confused. Went to ER and had UTI dx'd. Then 03/06/21, had recurrence of language and confusion changes. Dx'd with left MCA stroke, tx with mech thrombectomy. Stroke workup  completed. Then went to rehab, and then back home by Nov 2022. Completed outpatient cardiac monitor (negative). Therapies completed at home.   Now with mild language changes and right vision changes.   REVIEW OF SYSTEMS: Full 14 system review of systems performed and negative with exception of: as per HPI.  ALLERGIES: Allergies  Allergen Reactions   Statins Other (See Comments)    'discomfort, aching everywhere' Tolerates to livalo    Empagliflozin Other (See Comments)   Other     Allergic to diabetic medication but unsure of name. Went to ED February 2023 (ED note I do not see name of med)    Zetia [Ezetimibe] Other (See Comments)    Leg pain   Zocor [Simvastatin] Other (See Comments)    Leg pain    HOME MEDICATIONS: Outpatient Medications Prior to Visit  Medication Sig Dispense Refill   amLODipine (NORVASC) 10 MG tablet Take 10 mg by mouth daily.     aspirin EC 81 MG tablet Take 81 mg by mouth daily. Swallow whole.     cholecalciferol (VITAMIN D3) 25 MCG (1000 UNIT) tablet Take 1,000 Units by mouth daily.     cyanocobalamin (VITAMIN B12) 1000 MCG tablet Take 1,000 mcg by mouth daily.     gabapentin (NEURONTIN) 100 MG capsule Take 200 mg by mouth at bedtime.     glipiZIDE (GLUCOTROL) 5 MG tablet Take 5 mg by mouth every morning.     JARDIANCE 10 MG TABS tablet Take 10 mg by mouth daily.     levETIRAcetam (KEPPRA) 250 MG tablet Take 1 tablet (250 mg total) by mouth 2 (two)  times daily. 180 tablet 4   levothyroxine (SYNTHROID) 125 MCG tablet Take 1 tablet (125 mcg total) by mouth daily before breakfast. 90 tablet 3   losartan (COZAAR) 100 MG tablet Take 100 mg by mouth daily.     omeprazole (PRILOSEC) 20 MG capsule Take 20 mg by mouth daily.     pantoprazole (PROTONIX) 40 MG tablet Take 1 tablet (40 mg total) by mouth daily before lunch. 90 tablet 3   rosuvastatin (CRESTOR) 40 MG tablet Take 40 mg by mouth at bedtime.     diazePAM (VALTOCO 5 MG DOSE) 5 MG/0.1ML LIQD Spray in nose  if seizure last more than 5 minutes. Can repeat dose in 4 hours. Do not use more than twice in 24 hours. 2 each 0   gabapentin (NEURONTIN) 100 MG capsule Take 1 capsule (100 mg total) by mouth 3 (three) times daily. (Patient taking differently: Take 200 mg by mouth at bedtime.) 30 capsule 2   No facility-administered medications prior to visit.    PAST MEDICAL HISTORY: Past Medical History:  Diagnosis Date   Anxiety    Arthritis    Cataract    CVA (cerebral vascular accident) (HCC) 03/06/2021   Dizziness    Dysphagia    'sometimes but not a major issue' been checked out by GI (per pt)   Dysrhythmia    'heart used to skip but doesn't anymore' was checked out by Dr. Myrtis Ser late '90s, everything checked out ok and not had any skipping since (all per pt)   Essential hypertension, benign    Fatty liver    GERD (gastroesophageal reflux disease)    H/O hypercalcemia 04/12/2017   Hepatitis B    had at age 68, 'GI doc said it's gone away'   Hepatitis B surface antigen positive    High cholesterol    History of hiatal hernia    Hypercalcemia    Hypothyroidism    Kidney stone 12/2012   Patient was [facility] at [facility]   Obesity    Osteoporosis    PONV (postoperative nausea and vomiting)    Type 2 diabetes mellitus (HCC)     PAST SURGICAL HISTORY: Past Surgical History:  Procedure Laterality Date   ABDOMINAL HYSTERECTOMY  1981   APPENDECTOMY  1981   BILATERAL CARPAL TUNNEL RELEASE Bilateral    Dr. August Saucer at surgical center   BIOPSY  05/26/2017   Procedure: BIOPSY;  Surgeon: Malissa Hippo, MD;  [facility];  Service: Endoscopy;;  colon   BIOPSY  10/11/2018   Procedure: BIOPSY;  Surgeon: Malissa Hippo, MD;  [facility]: [facility];  Service: Endoscopy;;   CATARACT EXTRACTION, BILATERAL     COLONOSCOPY N/A 05/26/2017   Procedure: COLONOSCOPY;  Surgeon: Malissa Hippo, MD;  [facility]: [facility];  Service: Endoscopy;  Laterality: N/A;  10:30   Deviated septum repair      ESOPHAGEAL DILATION N/A 08/16/2019   Procedure: ESOPHAGEAL DILATION;  Surgeon: Malissa Hippo, MD;  [facility]: [facility];  Service: Endoscopy;  Laterality: N/A;   ESOPHAGOGASTRODUODENOSCOPY N/A 07/28/2016   Procedure: ESOPHAGOGASTRODUODENOSCOPY (EGD);  Surgeon: Malissa Hippo, MD;  [facility];  Service: Endoscopy;  Laterality: N/A;  300 - moved to 3/7 @ 1:00   ESOPHAGOGASTRODUODENOSCOPY N/A 08/16/2019   Procedure: ESOPHAGOGASTRODUODENOSCOPY (EGD);  Surgeon: Malissa Hippo, MD;  [facility];  Service: Endoscopy;  Laterality: N/A;  230   EYE SURGERY     FLEXIBLE SIGMOIDOSCOPY N/A 10/11/2018  Procedure: FLEXIBLE SIGMOIDOSCOPY;  Surgeon: Malissa Hippo, MD;  [facility]: [facility];  Service: Endoscopy;  Laterality: N/A;  unable to complete colonoscopy    IR CT HEAD LTD  03/07/2021   IR PERCUTANEOUS ART THROMBECTOMY/INFUSION INTRACRANIAL INC DIAG ANGIO  03/07/2021   IR US GUIDE VASC ACCESS RIGHT  03/07/2021   KNEE ARTHROPLASTY Left 09/15/2015   Procedure: COMPUTER ASSISTED TOTAL KNEE ARTHROPLASTY;  Surgeon: Eldred Manges, MD;  Location: [facility];  Service: Orthopedics;  Laterality: Left;   KNEE ARTHROSCOPY     left knee 2003   RADIOLOGY WITH ANESTHESIA N/A 03/07/2021   Procedure: IR WITH ANESTHESIA;  Surgeon: Gilmer Mor, DO;  Location: [facility];  Service: Anesthesiology;  Laterality: N/A;    FAMILY HISTORY: Family History  Problem Relation Age of Onset   COPD Mother    Heart attack Mother    Stroke Father    Hypertension Father    Hip fracture Father    Cancer Maternal Aunt    Diabetes Maternal Aunt    Diabetes Maternal Uncle    Colon cancer Neg Hx     SOCIAL HISTORY: Social History   Socioeconomic History   Marital status: Widowed    Spouse name: Not on file   Number of children: 1   Years of education: Not on file   Highest education level: High school graduate  Occupational History    Comment: retired  Tobacco Use   Smoking status: Never     Passive exposure: Past   Smokeless tobacco: Never  Vaping Use   Vaping Use: Never used  Substance and Sexual Activity   Alcohol use: No    Alcohol/week: 0.0 standard drinks of alcohol   Drug use: No   Sexual activity: Never  Other Topics Concern   Not on file  Social History Narrative   07/04/19 Lives with son  10/22/21   Caffeine- coffee 1 c daily   Social Determinants of Health   Financial Resource Strain: Low Risk  (09/05/2019)   Overall Financial Resource Strain (CARDIA)    Difficulty of Paying Living Expenses: Not hard at all  Food Insecurity: No Food Insecurity (06/10/2022)   Hunger Vital Sign    Worried About Running Out of Food in the Last Year: Never true    Ran Out of Food in the Last Year: Never true  Transportation Needs: No Transportation Needs (06/10/2022)   PRAPARE - Administrator, Civil Service (Medical): No    Lack of Transportation (Non-Medical): No  Physical Activity: Sufficiently Active (09/05/2019)   Exercise Vital Sign    Days of Exercise per Week: 3 days    Minutes of Exercise per Session: 60 min  Stress: No Stress Concern Present (09/05/2019)   [employer] of Occupational Health - Occupational Stress Questionnaire    Feeling of Stress : Not at all  Social Connections: Moderately Integrated (09/05/2019)   Social Connection and Isolation Panel [NHANES]    Frequency of Communication with Friends and Family: More than three times a week    Frequency of Social Gatherings with Friends and Family: More than three times a week    Attends Religious Services: More than 4 times per year    Active Member of [employer] or Organizations: No    Attends Engineer, structural: More than 4 times per year    Marital Status: Widowed  Intimate Partner Violence: Not At Risk (06/10/2022)   Humiliation, Afraid, Rape, and Kick questionnaire    Fear  of Current or Ex-Partner: No    Emotionally Abused: No    Physically Abused: No    Sexually Abused: No      PHYSICAL EXAM  GENERAL EXAM/CONSTITUTIONAL: Vitals:  Vitals:   11/12/22 1023  BP: 129/75  Pulse: 93  Weight: 167 lb (75.8 kg)  Height: 5\' 3"  (1.6 m)    Body mass index is 29.58 kg/m. Wt Readings from Last 3 Encounters:  11/12/22 167 lb (75.8 kg)  09/02/22 172 lb 6.4 oz (78.2 kg)  06/14/22 170 lb 10.2 oz (77.4 kg)   Patient is in no distress; well developed, nourished and groomed; neck is supple  CARDIOVASCULAR: Examination of carotid arteries is normal; no carotid bruits Regular rate and rhythm, no murmurs Examination of peripheral vascular system by observation and palpation is normal  EYES: Ophthalmoscopic exam of optic discs and posterior segments is normal; no papilledema or hemorrhages No results found.  MUSCULOSKELETAL: Gait, strength, tone, movements noted in Neurologic exam below  NEUROLOGIC: MENTAL STATUS:     09/27/2016   11:51 AM 09/11/2015    9:51 AM  MMSE - Mini Mental State Exam  Orientation to time 5 5  Orientation to Place 5 5  Registration 3 3  Attention/ Calculation 3 4  Recall 2 3  Language- name 2 objects 2 2  Language- repeat 1 1  Language- follow 3 step command 3 3  Language- read & follow direction 1 1  Write a sentence 1 1  Copy design 0 0  Total score 26 28   awake, alert, oriented to person recent and remote memory intact normal attention and concentration SLIGHTLY DECR FLUENCY; MILD COMPREHENSION DIFF AND APRAXIA fund of knowledge appropriate  CRANIAL NERVE:  2nd, 3rd, 4th, 6th - pupils --> RIGHT 2, LEFT 3; NR; DECR RIGHT VISUAL FIELD; extraocular muscles intact, no nystagmus 5th - facial sensation symmetric 7th - facial strength symmetric 8th - hearing intact 9th - palate elevates symmetrically, uvula midline 11th - shoulder shrug symmetric 12th - tongue protrusion midline  MOTOR:  normal bulk and tone, full strength in the BUE, BLE  SENSORY:  normal and symmetric to light touch, temperature,  vibration  COORDINATION:  finger-nose-finger, fine finger movements --> SLIGHT DECR COORDINATION IN RUE  REFLEXES:  deep tendon reflexes TRACE and symmetric  GAIT/STATION:  narrow based gait; CAUTIOUS     DIAGNOSTIC DATA (LABS, IMAGING, TESTING) - I reviewed patient records, labs, notes, testing and imaging myself where available.  Lab Results  Component Value Date   WBC 6.7 06/16/2022   HGB 12.0 06/16/2022   HCT 38.1 06/16/2022   MCV 86.2 06/16/2022   PLT 211 06/16/2022      Component Value Date/Time   NA 138 06/16/2022 0655   NA 137 07/16/2019 1002   K 4.9 06/16/2022 0655   CL 106 06/16/2022 0655   CO2 21 (L) 06/16/2022 0655   GLUCOSE 154 (H) 06/16/2022 0655   BUN 42 (H) 06/16/2022 0655   BUN 19 07/16/2019 1002   CREATININE 2.32 (H) 06/16/2022 0655   CALCIUM 8.8 (L) 06/16/2022 0655   PROT 6.0 (L) 06/10/2022 1756   PROT 6.1 07/16/2019 1002   ALBUMIN 3.4 (L) 06/10/2022 1756   ALBUMIN 3.5 (L) 07/16/2019 1002   AST 28 06/10/2022 1756   ALT 16 06/10/2022 1756   ALKPHOS 75 06/10/2022 1756   BILITOT 0.9 06/10/2022 1756   BILITOT 0.2 07/16/2019 1002   GFRNONAA 20 (L) 06/16/2022 0655   GFRAA 38 (L) 07/16/2019 1002  Lab Results  Component Value Date   CHOL 113 10/29/2021   HDL 39 (L) 10/29/2021   LDLCALC 30 10/29/2021   LDLDIRECT 147.9 (H) 03/07/2021   TRIG 218 (H) 10/29/2021   CHOLHDL 2.9 10/29/2021   Lab Results  Component Value Date   HGBA1C 7.2 (H) 10/28/2021   Lab Results  Component Value Date   VITAMINB12 >2000 (H) 01/27/2017   Lab Results  Component Value Date   TSH 0.483 10/29/2021    10/29/21 MRI brain Two small foci of restricted diffusion, with mild ADC correlates, in the left posteroinferior frontal lobe at the anterior aspect of an area that was involved in the infarcts seen on 03/08/2021 MRI. These are favored to represent additional subacute infarcts.  06/11/22 MRI brain 1. Small focus of hemorrhage in the left anterior temporal lobe  is similar to the prior CT, allowing for differences in modality. No significant mass effect. 2. No other acute intracranial pathology.    ASSESSMENT AND PLAN  89 y.o. year old female here with:   Dx:  1. Diabetic polyneuropathy associated with type 2 diabetes mellitus (HCC)   2. Seizure disorder (HCC)      PLAN:  DIABETIC NEUROPATHY - increase gabapentin as tolerated (up to 300-600mg  daily; divided in to 2-3 doses) - continue diabetes control - OTHER Painful neuropathy treatment options: -duloxetine 30-60mg  daily, amitriptyline 25-50mg  at bedtime, pregabalin 75-150mg  twice a day -capsaicin cream, lidocaine patch / cream, alpha-lipoic acid 600mg  daily  INTERMITTENT RIGHT FOOT COLOR CHANGE / SWELLING - follow up with PCP; consider cardiology or vascular evaluation   SEIZURE DISORDER (post stroke; last events June 2023 and possibly 06/09/22)  - continue levetiracetam 250mg  twice a day  - According to [place] law, you can not drive unless you are seizure / syncope free for at least 6 months and under physician's care.   - Please maintain precautions. Do not participate in activities where a loss of awareness could harm you or someone else. No swimming alone, no tub bathing, no hot tubs, no driving, no operating motorized vehicles (cars, ATVs, motocycles, etc), lawnmowers, power tools or firearms. No standing at heights, such as rooftops, ladders or stairs. Avoid hot objects such as stoves, heaters, open fires. Wear a helmet when riding a bicycle, scooter, skateboard, etc. and avoid areas of traffic. Set your water heater to 120 degrees or less.     ICH: Small ICH along left temporal cortex etiology indeterminate likely micro vascular disease from hypertension versus amyloid Code Stroke CT head 4 mm ICH along left temporal cortex CTA head & neck no LVO, severe stenosis in left PCA P1 segment, moderate/severe stenosis in right PCA P2 segment and moderate/severe stenosis in left MCA M2  segment MRI no acute infarct noted Follow-up CT 1/21 revealed stable ICH and hygroma along left cerebral convexity 2D Echo EF 55 to 60%  LDL 30 HgbA1c 7.2 Loop recorder reports no A-fib aspirin 81 mg daily and clopidogrel 75 mg daily prior to admission, now on aspirin 81 mg daily     Hypertension Home meds: Losartan 100 mg daily   Hyperlipidemia Home meds: Rosuvastatin 40 mg daily   Diabetes type II Uncontrolled Home meds: Glipizide 5 mg daily, Jardiance 10 mg daily, metformin 500 mg daily HgbA1c 7.2, goal < 7.0   Return in about 1 year (around 11/12/2023) for with NP. for seizure medication refills.    Suanne Marker, MD 11/12/2022, 10:49 AM Certified in Neurology, Neurophysiology and Neuroimaging  [facility]  [address] [place], [place] [address] [phone]

## 2022-11-12 NOTE — Patient Instructions (Addendum)
  DIABETIC NEUROPATHY - increase gabapentin as tolerated (up to 300-600mg  daily; divided in to 2-3 doses) - continue diabetes control - OTHER Painful neuropathy treatment options: -duloxetine 30-60mg  daily, amitriptyline 25-50mg  at bedtime, pregabalin 75-150mg  twice a day -capsaicin cream, lidocaine patch / cream, alpha-lipoic acid 600mg  daily   INTERMITTENT RIGHT FOOT COLOR CHANGE / SWELLING - follow up with PCP; consider cardiology or vascular evaluation   SEIZURE DISORDER (post stroke; last events June 2023 and possibly 06/09/22) - continue levetiracetam 250mg  twice a day

## 2022-11-29 NOTE — Progress Notes (Signed)
Carelink Summary Report / Loop Recorder 

## 2022-12-06 ENCOUNTER — Ambulatory Visit (INDEPENDENT_AMBULATORY_CARE_PROVIDER_SITE_OTHER): Payer: Medicare Other

## 2022-12-06 DIAGNOSIS — I639 Cerebral infarction, unspecified: Secondary | ICD-10-CM

## 2022-12-08 LAB — CUP PACEART REMOTE DEVICE CHECK
Date Time Interrogation Session: 20240713230304
Implantable Pulse Generator Implant Date: 20231115

## 2022-12-13 ENCOUNTER — Ambulatory Visit: Payer: Medicare Other

## 2022-12-22 NOTE — Progress Notes (Signed)
Carelink Summary Report / Loop Recorder 

## 2023-01-05 ENCOUNTER — Institutional Professional Consult (permissible substitution): Payer: Medicare Other | Admitting: Diagnostic Neuroimaging

## 2023-01-07 LAB — CUP PACEART REMOTE DEVICE CHECK
Date Time Interrogation Session: 20240816091631
Implantable Pulse Generator Implant Date: 20231115

## 2023-01-10 ENCOUNTER — Ambulatory Visit (INDEPENDENT_AMBULATORY_CARE_PROVIDER_SITE_OTHER): Payer: Medicare Other

## 2023-01-10 DIAGNOSIS — I639 Cerebral infarction, unspecified: Secondary | ICD-10-CM | POA: Diagnosis not present

## 2023-01-17 ENCOUNTER — Ambulatory Visit: Payer: Medicare Other

## 2023-01-21 NOTE — Progress Notes (Signed)
Carelink Summary Report / Loop Recorder 

## 2023-02-14 ENCOUNTER — Ambulatory Visit (INDEPENDENT_AMBULATORY_CARE_PROVIDER_SITE_OTHER): Payer: Medicare Other

## 2023-02-14 DIAGNOSIS — I639 Cerebral infarction, unspecified: Secondary | ICD-10-CM

## 2023-02-17 ENCOUNTER — Ambulatory Visit: Payer: Medicare Other | Admitting: Adult Health

## 2023-02-28 ENCOUNTER — Telehealth: Payer: Self-pay | Admitting: Student

## 2023-02-28 NOTE — Telephone Encounter (Signed)
   Patient's son Joey called Answering Service. Called and spoke with son (okay per DPR) He states patient recently got a new phone and now cannot get it connected with the loop recorder. It sounds like they downloaded the [employer] APP but cannot get it connected. Explained that unfortunately I am unable to help them with this as I am not sure how that works. Apologized for this. Provided [employer] patient services number from their website. I will also route this message to the device clinic to see if they can call patient's son tomorrow. Joey was appreciative for the call.  Corrin Parker, PA-C 02/28/2023 7:05 PM

## 2023-03-01 NOTE — Progress Notes (Signed)
Carelink Summary Report / Loop Recorder 

## 2023-03-04 NOTE — Telephone Encounter (Signed)
Pt monitor updated 03/04/2023.

## 2023-03-14 ENCOUNTER — Telehealth: Payer: Self-pay | Admitting: Cardiology

## 2023-03-14 NOTE — Telephone Encounter (Signed)
Spoke to son, dpr on file. Aware I will have Dr. Elberta Fortis review and let him know.  Aware it may be several weeks before he hears back from me on this. He is agreeable.   Dr. Elberta Fortis:  ILR implanted 03/2022 for cryptogenic stroke.  Son reports pt had another stroke 05/2022.

## 2023-03-14 NOTE — Telephone Encounter (Signed)
Calling to see how long the patient have to have her loop recorder. Patient feels that she doesn't need it anymore. Please advise

## 2023-03-21 ENCOUNTER — Ambulatory Visit (INDEPENDENT_AMBULATORY_CARE_PROVIDER_SITE_OTHER): Payer: Medicare Other

## 2023-03-21 DIAGNOSIS — I639 Cerebral infarction, unspecified: Secondary | ICD-10-CM

## 2023-03-21 LAB — CUP PACEART REMOTE DEVICE CHECK
Date Time Interrogation Session: 20241027231457
Implantable Pulse Generator Implant Date: 20231115

## 2023-03-24 NOTE — Telephone Encounter (Signed)
Spoke to pt & son. Pt would prefer to just turn it off and stop monitoring. Forwarding to device clinic to make arrangements.  They will let us know if pt would like to have explanted, but at this time she will just turn it off and leave it implanted.

## 2023-03-24 NOTE — Telephone Encounter (Signed)
Left message to call back  (Per MD: pt choice, can leave it in and turn it off/stop monitoring or explant device if prefers)

## 2023-03-28 NOTE — Telephone Encounter (Signed)
I canceled all upcoming remotes. Marked her Inactive in [place] and took her out of [facility].

## 2023-04-11 NOTE — Progress Notes (Signed)
Carelink Summary Report / Loop Recorder 

## 2023-04-25 ENCOUNTER — Ambulatory Visit: Payer: Medicare Other

## 2023-05-30 ENCOUNTER — Ambulatory Visit: Payer: Medicare Other

## 2023-07-04 ENCOUNTER — Ambulatory Visit: Payer: Medicare Other

## 2023-08-08 ENCOUNTER — Ambulatory Visit: Payer: Medicare Other

## 2023-08-29 ENCOUNTER — Ambulatory Visit: Payer: Medicare Other | Admitting: Diagnostic Neuroimaging

## 2023-09-01 ENCOUNTER — Ambulatory Visit: Payer: Medicare Other | Admitting: Diagnostic Neuroimaging

## 2023-09-12 ENCOUNTER — Ambulatory Visit: Payer: Medicare Other

## 2023-11-09 ENCOUNTER — Telehealth: Payer: Self-pay | Admitting: Diagnostic Neuroimaging

## 2023-11-09 NOTE — Telephone Encounter (Signed)
 Son called and confirmed appointment for Wed of next week

## 2023-11-15 NOTE — Progress Notes (Unsigned)
 [facility]  PATIENT: Lisa Sutton DOB: 1934-01-10  REFERRING CLINICIAN: Renato Dorothey HERO, NP HISTORY FROM: patient, son, CNA, chart REASON FOR VISIT: follow up   HISTORICAL  CHIEF COMPLAINT:  Chief Complaint  Patient presents with   Peripheral Neuropathy    Rm 3 with son and CNA Pt is well, reports neuropathy has worsen. She is having increase in burning and tingling on R side, symptoms have progressed up her leg.  No seizures.     HISTORY OF PRESENT ILLNESS:   Update 11/16/2023 JM: Patient returns for yearly follow-up accompanied by her son and personal CNA.  Reports gradual worsening of neuropathy since prior visit, more so right leg vs left. Has occasional symptoms in left foot. Symptoms now radiating up mid shin. Reports PCP increased gabapentin  dosage several months ago, currently taking 100mg  around 5pm and 300mg  at bedtime, this has helped some.  She has also tried OTC creams which have helped some.  Symptoms are typically worse at night, not overly bothersome during the day.  She has follow-up with PCP tomorrow, prior A1c 8.1 in March. She does complain of daytime fatigue, can have difficulty sleeping at night and occasional nocturia, denies snoring, witnessed apneas or headaches.  Reports compliance on Keppra , no seizure activity.  No new stroke/TIA symptoms.  Remains on aspirin  and Crestor .  Routinely follows with PCP for stroke risk factor management.    UPDATE (11/12/22, VRP): Since last visit, having right > left foot pain since March 2024. Burning pain in feet. On gabapentin  200mg  at bed time. Also with right foot blue color change and swelling at times.  UPDATE (09/02/22, VRP): Since last visit, doing well until Jan 2024, had a fall and confusion. Found to have ICH, likely chronic small vessel ischemic disease vs amyloid dz. Now stable. Tolerating meds. Still with fatigue, dizziness issues. Using walker intermittently at home. Finishing with PT  exercises.  UPDATE (06/13/22, Dr. Jerri): 89 y.o. female with history of seizures, hypertension, hyperlipidemia, diabetes and left MCA stroke presenting after she had an unwitnessed fall with acute onset altered mental status and worsening right-sided weakness.  She does have a history of seizures and may have had a seizure prior to the unwitnessed fall, as she had her normal seizure prodrome of right-sided pain and sensory deficit before falling.  CT head shows small ICH along left temporal cortex.  UPDATE (02/03/22, VRP): Since last visit, had seizure in October 28, 2021. Now on LEV. Had some punctate subacute strokes on MRI. Intermittent dizziness, chest palpitations.   PRIOR HPI (10/22/21): 89 year old female with hypertension, hyperlipidemia and diabetes, here for stroke follow up. 03/02/21 had event of driving into garage and being confused. Went to ER and had UTI dx'd. Then 03/06/21, had recurrence of language and confusion changes. Dx'd with left MCA stroke, tx with mech thrombectomy. Stroke workup completed. Then went to rehab, and then back home by Nov 2022. Completed outpatient cardiac monitor (negative). Therapies completed at home.   Now with mild language changes and right vision changes.   REVIEW OF SYSTEMS: Full 14 system review of systems performed and negative with exception of: as per HPI.  ALLERGIES: Allergies  Allergen Reactions   Statins Other (See Comments)    'discomfort, aching everywhere' Tolerates to livalo     Empagliflozin Other (See Comments)   Other     Allergic to diabetic medication but unsure of name. Went to ED February 2023 (ED note I do not see name of  med)    Zetia [Ezetimibe] Other (See Comments)    Leg pain   Zocor [Simvastatin] Other (See Comments)    Leg pain    HOME MEDICATIONS: Outpatient Medications Prior to Visit  Medication Sig Dispense Refill   amLODipine  (NORVASC ) 10 MG tablet Take 10 mg by mouth daily.     aspirin  EC 81 MG tablet Take 81 mg by  mouth daily. Swallow whole.     cholecalciferol  (VITAMIN D3) 25 MCG (1000 UNIT) tablet Take 1,000 Units by mouth daily.     cyanocobalamin (VITAMIN B12) 1000 MCG tablet Take 1,000 mcg by mouth daily.     gabapentin  (NEURONTIN ) 100 MG capsule Take 100 mg by mouth in the morning, at noon, in the evening, and at bedtime.     glipiZIDE  (GLUCOTROL ) 5 MG tablet Take 5 mg by mouth every morning.     JARDIANCE 10 MG TABS tablet Take 10 mg by mouth daily.     levothyroxine  (SYNTHROID ) 125 MCG tablet Take 1 tablet (125 mcg total) by mouth daily before breakfast. 90 tablet 3   omeprazole  (PRILOSEC) 20 MG capsule Take 20 mg by mouth daily.     rosuvastatin  (CRESTOR ) 40 MG tablet Take 40 mg by mouth at bedtime.     levETIRAcetam  (KEPPRA ) 250 MG tablet Take 1 tablet (250 mg total) by mouth 2 (two) times daily. 180 tablet 4   losartan  (COZAAR ) 100 MG tablet Take 100 mg by mouth daily. (Patient not taking: Reported on 11/16/2023)     pantoprazole  (PROTONIX ) 40 MG tablet Take 1 tablet (40 mg total) by mouth daily before lunch. (Patient not taking: Reported on 11/16/2023) 90 tablet 3   No facility-administered medications prior to visit.    PAST MEDICAL HISTORY: Past Medical History:  Diagnosis Date   Anxiety    Arthritis    Cataract    CVA (cerebral vascular accident) (HCC) 03/06/2021   Dizziness    Dysphagia    'sometimes but not a major issue' been checked out by GI (per pt)   Dysrhythmia    'heart used to skip but doesn't anymore' was checked out by Dr. Micky late '90s, everything checked out ok and not had any skipping since (all per pt)   Essential hypertension, benign    Fatty liver    GERD (gastroesophageal reflux disease)    H/O hypercalcemia 04/12/2017   Hepatitis B    had at age 41, 'GI doc said it's gone away'   Hepatitis B surface antigen positive    High cholesterol    History of hiatal hernia    Hypercalcemia    Hypothyroidism    Kidney stone 12/2012   Patient was [facility] at [facility]    Obesity    Osteoporosis    PONV (postoperative nausea and vomiting)    Type 2 diabetes mellitus (HCC)     PAST SURGICAL HISTORY: Past Surgical History:  Procedure Laterality Date   ABDOMINAL HYSTERECTOMY  1981   APPENDECTOMY  1981   BILATERAL CARPAL TUNNEL RELEASE Bilateral    Dr. Addie at surgical center   BIOPSY  05/26/2017   Procedure: BIOPSY;  Surgeon: Golda Claudis PENNER, MD;  Location: [facility];  Service: Endoscopy;;  colon   BIOPSY  10/11/2018   Procedure: BIOPSY;  Surgeon: Golda Claudis PENNER, MD;  [facility]: [facility];  Service: Endoscopy;;   CATARACT EXTRACTION, BILATERAL     COLONOSCOPY N/A 05/26/2017   Procedure: COLONOSCOPY;  Surgeon: Golda Claudis PENNER, MD;  [facility]: [facility];  Service: Endoscopy;  Laterality: N/A;  10:30   Deviated septum repair     ESOPHAGEAL DILATION N/A 08/16/2019   Procedure: ESOPHAGEAL DILATION;  Surgeon: Golda Claudis PENNER, MD;  [facility]: [facility];  Service: Endoscopy;  Laterality: N/A;   ESOPHAGOGASTRODUODENOSCOPY N/A 07/28/2016   Procedure: ESOPHAGOGASTRODUODENOSCOPY (EGD);  Surgeon: Claudis PENNER Golda, MD;  [facility]: [facility];  Service: Endoscopy;  Laterality: N/A;  300 - moved to 3/7 @ 1:00   ESOPHAGOGASTRODUODENOSCOPY N/A 08/16/2019   Procedure: ESOPHAGOGASTRODUODENOSCOPY (EGD);  Surgeon: Golda Claudis PENNER, MD;  [facility];  Service: Endoscopy;  Laterality: N/A;  230   EYE SURGERY     FLEXIBLE SIGMOIDOSCOPY N/A 10/11/2018   Procedure: FLEXIBLE SIGMOIDOSCOPY;  Surgeon: Golda Claudis PENNER, MD;  [facility]: [facility];  Service: Endoscopy;  Laterality: N/A;  unable to complete colonoscopy    IR CT HEAD LTD  03/07/2021   IR PERCUTANEOUS ART THROMBECTOMY/INFUSION INTRACRANIAL INC DIAG ANGIO  03/07/2021   IR US  GUIDE VASC ACCESS RIGHT  03/07/2021   KNEE ARTHROPLASTY Left 09/15/2015   Procedure: COMPUTER ASSISTED TOTAL KNEE ARTHROPLASTY;  Surgeon: Oneil JAYSON Herald, MD;  Location: [facility];  Service: Orthopedics;  Laterality: Left;    KNEE ARTHROSCOPY     left knee 2003   RADIOLOGY WITH ANESTHESIA N/A 03/07/2021   Procedure: IR WITH ANESTHESIA;  Surgeon: Alona Corners, DO;  Location: [facility];  Service: Anesthesiology;  Laterality: N/A;    FAMILY HISTORY: Family History  Problem Relation Age of Onset   COPD Mother    Heart attack Mother    Stroke Father    Hypertension Father    Hip fracture Father    Cancer Maternal Aunt    Diabetes Maternal Aunt    Diabetes Maternal Uncle    Colon cancer Neg Hx     SOCIAL HISTORY: Social History   Socioeconomic History   Marital status: Widowed    Spouse name: Not on file   Number of children: 1   Years of education: Not on file   Highest education level: High school graduate  Occupational History    Comment: retired  Tobacco Use   Smoking status: Never    Passive exposure: Past   Smokeless tobacco: Never  Vaping Use   Vaping status: Never Used  Substance and Sexual Activity   Alcohol use: No    Alcohol/week: 0.0 standard drinks of alcohol   Drug use: No   Sexual activity: Never  Other Topics Concern   Not on file  Social History Narrative   07/04/19 Lives with son  10/22/21   Caffeine- coffee 1 c daily   Social Drivers of Health   Financial Resource Strain: Patient Unable To Answer (07/27/2023)   Received from [facility]   Overall Financial Resource Strain (CARDIA)    Difficulty of Paying Living Expenses: Patient unable to answer  Food Insecurity: Patient Unable To Answer (07/27/2023)   Received from [facility]   Hunger Vital Sign    Within the past 12 months, you worried that your food would run out before you got the money to buy more.: Patient unable to answer    Within the past 12 months, the food you bought just didn't last and you didn't have money to get more.: Patient unable to answer  Transportation Needs: Patient Unable To Answer (07/27/2023)   Received from [facility] - Transportation    Lack of Transportation (Medical): Patient  unable to answer  Lack of Transportation (Non-Medical): Patient unable to answer  Physical Activity: Sufficiently Active (09/05/2019)   Exercise Vital Sign    Days of Exercise per Week: 3 days    Minutes of Exercise per Session: 60 min  Stress: No Stress Concern Present (09/05/2019)   [employer] [facility] [employer] - Occupational Stress Questionnaire    Feeling of Stress : Not at all  Social Connections: Unknown (11/21/2022)   Received from [facility]   Social Network    Social Network: Not on file  Intimate Partner Violence: Unknown (11/21/2022)   Received from [facility]   HITS    Physically Hurt: Not on file    Insult or Talk Down To: Not on file    Threaten Physical Harm: Not on file    Scream or Curse: Not on file     PHYSICAL EXAM  GENERAL EXAM/CONSTITUTIONAL: Vitals:  Vitals:   11/16/23 1017  BP: (!) 140/74  Pulse: 82  Weight: 173 lb (78.5 kg)  Height: 5' 3 (1.6 m)    Patient is in no distress; very pleasant elderly Caucasian female, well developed, nourished and groomed; neck is supple  CARDIOVASCULAR: Examination of carotid arteries is normal; no carotid bruits Regular rate and rhythm, no murmurs Examination of peripheral vascular system by observation and palpation is normal  MUSCULOSKELETAL: Gait, strength, tone, movements noted in Neurologic exam below  NEUROLOGIC: MENTAL STATUS:  awake, alert, oriented to person recent memory mildly impaired and remote memory intact normal attention and concentration SLIGHTLY DECR FLUENCY; MILD COMPREHENSION DIFF AND APRAXIA fund of knowledge appropriate  CRANIAL NERVE:  2nd, 3rd, 4th, 6th - pupils --> RIGHT 2, LEFT 3; NR; DECR RIGHT VISUAL FIELD; extraocular muscles intact, no nystagmus 5th - facial sensation symmetric 7th - facial strength symmetric 8th - hearing intact 9th - palate elevates symmetrically, uvula midline 11th - shoulder shrug symmetric 12th - tongue protrusion midline  MOTOR:   normal bulk and tone, full strength in the BUE, BLE  SENSORY:  normal and symmetric to light touch, temperature, vibration; decreased pinprick RLE up to shin and LLE up to ankle; position sensation mildly impaired R>L; intact sensation upper extremities  COORDINATION:  finger-nose-finger, fine finger movements --> SLIGHT DECR COORDINATION IN RUE  REFLEXES:  deep tendon reflexes TRACE and symmetric  GAIT/STATION:  narrow based gait; CAUTIOUS, no use of AD     DIAGNOSTIC DATA (LABS, IMAGING, TESTING) - I reviewed patient records, labs, notes, testing and imaging myself where available.  Lab Results  Component Value Date   WBC 6.7 06/16/2022   HGB 12.0 06/16/2022   HCT 38.1 06/16/2022   MCV 86.2 06/16/2022   PLT 211 06/16/2022      Component Value Date/Time   NA 138 06/16/2022 0655   NA 137 07/16/2019 1002   K 4.9 06/16/2022 0655   CL 106 06/16/2022 0655   CO2 21 (L) 06/16/2022 0655   GLUCOSE 154 (H) 06/16/2022 0655   BUN 42 (H) 06/16/2022 0655   BUN 19 07/16/2019 1002   CREATININE 2.32 (H) 06/16/2022 0655   CALCIUM  8.8 (L) 06/16/2022 0655   PROT 6.0 (L) 06/10/2022 1756   PROT 6.1 07/16/2019 1002   ALBUMIN 3.4 (L) 06/10/2022 1756   ALBUMIN 3.5 (L) 07/16/2019 1002   AST 28 06/10/2022 1756   ALT 16 06/10/2022 1756   ALKPHOS 75 06/10/2022 1756   BILITOT 0.9 06/10/2022 1756   BILITOT 0.2 07/16/2019 1002   GFRNONAA 20 (L) 06/16/2022 0655   GFRAA 38 (  L) 07/16/2019 1002   Lab Results  Component Value Date   CHOL 113 10/29/2021   HDL 39 (L) 10/29/2021   LDLCALC 30 10/29/2021   LDLDIRECT 147.9 (H) 03/07/2021   TRIG 218 (H) 10/29/2021   CHOLHDL 2.9 10/29/2021   Lab Results  Component Value Date   HGBA1C 7.2 (H) 10/28/2021   Lab Results  Component Value Date   VITAMINB12 >2000 (H) 01/27/2017   Lab Results  Component Value Date   TSH 0.483 10/29/2021    10/29/21 MRI brain Two small foci of restricted diffusion, with mild ADC correlates, in the left  posteroinferior frontal lobe at the anterior aspect of an area that was involved in the infarcts seen on 03/08/2021 MRI. These are favored to represent additional subacute infarcts.  06/11/22 MRI brain 1. Small focus of hemorrhage in the left anterior temporal lobe is similar to the prior CT, allowing for differences in modality. No significant mass effect. 2. No other acute intracranial pathology.     ASSESSMENT AND PLAN  89 y.o. year old female here with:   Dx:  1. Neuropathy   2. Diabetic polyneuropathy associated with type 2 diabetes mellitus (HCC)   3. Seizure disorder (HCC)   4. Chronic ischemic left MCA stroke       PLAN:  NEUROPATHY - suspect combination of poststroke pain as more lateralized symptoms but also suspect underlying diabetes contributing especially with gradual progression - Continue gabapentin  100mg  evening and 300mg  PM per PCP (max dose 600mg /day due to kidney function) - recommend starting amitriptyline 10 mg nightly, discussed potential side effects and additional information provided in AVS, she will call after 2-3 weeks if no benefit for dosage adjustment or sooner if difficulty tolerating - continue OTC creams such as capsaicin and diclofenac to help with localized symptoms - Hesitant to trial duloxetine  due to kidney function - Discussed importance of diabetic management - prior A1c 8.1 (07/2023) - has f/u visit tomorrow with PCP - continue B12 supplement and Synthroid , ensure routine monitoring of labs by PCP   SEIZURE DISORDER (post stroke; last events June 2023 and possibly 06/09/22) - continue levetiracetam  250mg  twice a day - refill provided  - advised to cal with any recurrent seizure activity    HX OF STROKE HX OF ICH - Continue aspirin  81 mg daily and Crestor  40 mg daily for secondary stroke prevention manner/prescribed by PCP - Continue close PCP follow-up for aggressive stroke risk factor management   DAYTIME SLEEPINESS - Suspect  multifactorial from polypharmacy, continue neuropathic pain, overall sedentary, insomnia and other morbidities - possible improvement of insomnia on amitriptyline which may improve daytime fatigue but if symptoms persist, consider pursing sleep evaluation to rule out sleep apnea especially with hx of prior strokes      Return in about 6 months (around 05/17/2024).     I personally spent a total of 50 minutes in the care of the patient today including preparing to see the patient, getting/reviewing separately obtained history, performing a medically appropriate exam/evaluation, counseling and educating, placing orders, and documenting clinical information in the EHR. This is our first time meeting and time has been spent reviewing past medical history and relevant medical records.  Harlene Bogaert, AGNP-BC  [facility] [address] [place], [place] [address]  Phone [phone] Fax [phone] Note: This document was prepared with digital dictation and possible smart phrase technology. Any transcriptional errors that result from this process are unintentional.

## 2023-11-16 ENCOUNTER — Encounter: Payer: Self-pay | Admitting: Adult Health

## 2023-11-16 ENCOUNTER — Ambulatory Visit: Payer: Medicare Other | Admitting: Adult Health

## 2023-11-16 VITALS — BP 140/74 | HR 82 | Ht 63.0 in | Wt 173.0 lb

## 2023-11-16 DIAGNOSIS — G629 Polyneuropathy, unspecified: Secondary | ICD-10-CM | POA: Diagnosis not present

## 2023-11-16 DIAGNOSIS — G40909 Epilepsy, unspecified, not intractable, without status epilepticus: Secondary | ICD-10-CM

## 2023-11-16 DIAGNOSIS — E1142 Type 2 diabetes mellitus with diabetic polyneuropathy: Secondary | ICD-10-CM

## 2023-11-16 DIAGNOSIS — R400 Somnolence: Secondary | ICD-10-CM

## 2023-11-16 DIAGNOSIS — Z8673 Personal history of transient ischemic attack (TIA), and cerebral infarction without residual deficits: Secondary | ICD-10-CM | POA: Diagnosis not present

## 2023-11-16 DIAGNOSIS — Z7984 Long term (current) use of oral hypoglycemic drugs: Secondary | ICD-10-CM

## 2023-11-16 MED ORDER — LEVETIRACETAM 250 MG PO TABS: 250.0000 mg | ORAL_TABLET | Freq: Two times a day (BID) | ORAL | 4 refills | Status: DC

## 2023-11-16 MED ORDER — AMITRIPTYLINE HCL 10 MG PO TABS: 10.0000 mg | ORAL_TABLET | Freq: Every day | ORAL | 11 refills | Status: DC

## 2023-11-16 NOTE — Patient Instructions (Addendum)
 Your Plan:  Start amitriptyline 10mg  nightly  - please call or send mychart message after a couple weeks if no benefit or sooner if difficulty tolerating  Follow up with PCP tomorrow for ongoing monitoring of diabetes - if diabetes remains uncontrolled, your neuropathy will likely continue to worsen   Continue gabapentin  at current dose  Continue over the counter creams - can try capsaicin cream/patches (Salonpas) which can further helps with symptoms   Continue keppra  250mg  twice daily for seizure prevention      Follow up in 6 months or call earlier if needed      Thank you for coming to see us  at [facility]. I hope we have been able to provide you high quality care today.  You may receive a patient satisfaction survey over the next few weeks. We would appreciate your feedback and comments so that we may continue to improve ourselves and the health of our patients.     Amitriptyline Tablets What is this medication? AMITRIPTYLINE (a mee TRIP ti leen) treats depression. It increases the amount of serotonin and norepinephrine in the brain, hormones that help regulate mood. It belongs to a group of medications called tricyclic antidepressants (TCAs). This medicine may be used for other purposes; ask your health care provider or pharmacist if you have questions. COMMON BRAND NAME(S): Elavil, Vanatrip What should I tell my care team before I take this medication? They need to know if you have any of these conditions: Asthma, trouble breathing Bipolar disorder or schizophrenia Difficulty passing urine, prostate trouble Frequently drink alcohol Glaucoma Heart disease or previous heart attack Liver disease Seizures Suicidal thoughts, plans, or attempt by you or a family member Thyroid  disease An unusual or allergic reaction to amitriptyline, other medications, foods, dyes, or preservatives Pregnant or trying to get pregnant Breastfeeding How should I use  this medication? Take this medication by mouth with a drink of water . Follow the directions on the prescription label. You can take the tablets with or without food. Take your medication at regular intervals. Do not take it more often than directed. Do not stop taking this medication suddenly except upon the advice of your care team. Stopping this medication too quickly may cause serious side effects or your condition may worsen. A special MedGuide will be given to you by the pharmacist with each prescription and refill. Be sure to read this information carefully each time. Talk to your care team regarding the use of this medication in children. Special care may be needed. Overdosage: If you think you have taken too much of this medicine contact a poison control center or emergency room at once. NOTE: This medicine is only for you. Do not share this medicine with others. What if I miss a dose? If you miss a dose, take it as soon as you can. If it is almost time for your next dose, take only that dose. Do not take double or extra doses. What may interact with this medication? Do not take this medication with any of the following: Arsenic trioxide Certain medications used to regulate abnormal heartbeat or to treat other heart conditions Cisapride Droperidol Halofantrine Linezolid MAOIs like Carbex, Eldepryl, Marplan, Nardil, and Parnate Methylene blue Other medications for mental depression Phenothiazines like perphenazine, thioridazine and chlorpromazine Pimozide Probucol Procarbazine Sparfloxacin St. John's Wort This medication may also interact with the following: Atropine  and related medications like hyoscyamine, scopolamine , tolterodine and others Barbiturate medications for inducing sleep or treating seizures, like phenobarbital Cimetidine Disulfiram  Ethchlorvynol Thyroid  hormones such as levothyroxine  Ziprasidone This list may not describe all possible interactions. Give your  health care provider a list of all the medicines, herbs, non-prescription drugs, or dietary supplements you use. Also tell them if you smoke, drink alcohol, or use illegal drugs. Some items may interact with your medicine. What should I watch for while using this medication? Visit your care team for regular checks on your progress. It may take several weeks to see the full effects of this medication, and it is important to continue your treatment as prescribed by your care team. Tell your care team if your symptoms do not get better or if they get worse. Patients and their families should watch out for new or worsening thoughts of suicide or depression. Also watch out for sudden changes in feelings such as feeling anxious, agitated, panicky, irritable, hostile, aggressive, impulsive, severely restless, overly excited and hyperactive, or not being able to sleep. If this happens, especially at the beginning of treatment or after a change in dose, call your care team. This medication may affect your coordination, reaction time, or judgment. Do not drive or operate machinery until you know how this medication affects you. Sit up or stand slowly to reduce the risk of dizzy or fainting spells. Drinking alcohol with this medication can increase the risk of these side effects. Do not treat yourself for coughs, colds, or allergies while you are taking this medication without asking your care team for advice. Some ingredients can increase possible side effects. Your mouth may get dry. Chewing sugarless gum or sucking hard candy and drinking plenty of water  may help. Contact your care team if the problem does not go away or is severe. This medication may cause dry eyes and blurred vision. If you wear contact lenses, you may feel some discomfort. Lubricating eye drops may help. See your care team if the problem does not go away or is severe. This medication can cause constipation. If you do not have a bowel movement for  3 days, call your care team. This medication can make you more sensitive to the sun. Keep out of the sun. If you cannot avoid being in the sun, wear protective clothing and sunscreen. Do not use sun lamps, tanning beds, or tanning booths. What side effects may I notice from receiving this medication? Side effects that you should report to your care team as soon as possible: Allergic reactions--skin rash, itching, hives, swelling of the face, lips, tongue, or throat Heart rhythm changes-- fast or irregular heartbeat, dizziness, feeling faint or lightheaded, chest pain, trouble breathing Serotonin syndrome--irritability, confusion, fast or irregular heartbeat, muscle stiffness, twitching muscles, sweating, high fever, seizure, chills, vomiting, diarrhea Sudden eye pain or change in vision such as blurry vision, seeing halos around lights, vision loss Thoughts of suicide or self-harm, worsening mood, feelings of depression Side effects that usually do not require medical attention (report to your care team if they continue or are bothersome): Change in appetite or weight Change in sex drive or performance Constipation Dizziness Drowsiness Dry mouth Tremors This list may not describe all possible side effects. Call your doctor for medical advice about side effects. You may report side effects to FDA at 1-800-FDA-1088. Where should I keep my medication? Keep out of the reach of children and pets. Store at room temperature between 20 and 25 degrees C (68 and 77 degrees F). Throw away any unused medication after the expiration date. NOTE: This sheet is a summary. It may  not cover all possible information. If you have questions about this medicine, talk to your doctor, pharmacist, or health care provider.  2024 [employer] (2022-01-21 00:00:00)

## 2023-11-21 ENCOUNTER — Ambulatory Visit: Payer: Medicare Other

## 2023-11-28 ENCOUNTER — Telehealth: Payer: Self-pay | Admitting: Adult Health

## 2023-11-28 NOTE — Telephone Encounter (Signed)
 Pt's son has called to report that pt seems to be doing well on the amitriptyline  (ELAVIL ) 10 MG tablet

## 2023-11-28 NOTE — Telephone Encounter (Signed)
 Noted. Thanks for the information. Pt should continue at current dose.there were refills provided for the pt.

## 2023-11-29 ENCOUNTER — Other Ambulatory Visit (HOSPITAL_COMMUNITY): Payer: Self-pay

## 2023-12-26 ENCOUNTER — Ambulatory Visit: Payer: Medicare Other

## 2024-01-30 ENCOUNTER — Ambulatory Visit: Payer: Medicare Other

## 2024-03-05 ENCOUNTER — Ambulatory Visit: Payer: Medicare Other

## 2024-03-07 ENCOUNTER — Encounter (INDEPENDENT_AMBULATORY_CARE_PROVIDER_SITE_OTHER): Payer: Self-pay | Admitting: Gastroenterology

## 2024-04-09 ENCOUNTER — Ambulatory Visit: Payer: Medicare Other

## 2024-05-14 ENCOUNTER — Ambulatory Visit: Payer: Medicare Other

## 2024-06-07 NOTE — Progress Notes (Signed)
 "   [facility]  PATIENT: Lisa Sutton DOB: [birth year]  REFERRING CLINICIAN: Renato Dorothey HERO, NP HISTORY FROM: patient, son, CNA, chart REASON FOR VISIT: follow up   HISTORICAL  CHIEF COMPLAINT:  Chief Complaint  Patient presents with   RM 8     Patient is here for neuropathy -     Follow-up    Patient with son and caregiver.  Patient states no new or worsening issues at the moment, expect right side pain.     HISTORY OF PRESENT ILLNESS:   Update 06/11/2024 JM: Patient returns for follow-up visit after prior visit 7 months ago.  At prior visit, she was started on amitriptyline  10 mg nightly for progressive painful neuropathy. After visit, PCP switched her from gabapentin  to pregabalin .  She reports overall great improvement of neuropathy with both amitriptyline  and pregabalin , also notes improvement of sleep due to less pain at night.  Tolerating current dosages without side effects.  She does mention chronic pain over anterior of right thigh (from knee to groin), symptoms intermittent usually occurring at night, son mentions she has not been complaining as much since initiating amitriptyline .  Son also assist with use of topical ointments for neuropathy with further benefit.  Continues close follow-up with PCP.  Recent lab work completed which showed excellent improvement of A1c at 6.9, previously 8.1.  She is now on Ozempic. Denies any recurrent seizure activity, compliant on Keppra  without side effects. Does have clonazopam on hand if needed (rx'd from prior hospitalization) but hasn't needed to use.  No new stroke/TIA symptoms.  Reports compliance on aspirin  and Crestor .  Routinely follows with PCP for stroke risk factor management.  No further questions or concerns at this time.      Update 11/16/2023 JM: Patient returns for yearly follow-up accompanied by her son and personal CNA.  Reports gradual worsening of neuropathy since prior visit, more so right  leg vs left. Has occasional symptoms in left foot. Symptoms now radiating up mid shin. Reports PCP increased gabapentin  dosage several months ago, currently taking 100mg  around 5pm and 300mg  at bedtime, this has helped some.  She has also tried OTC creams which have helped some.  Symptoms are typically worse at night, not overly bothersome during the day.  She has follow-up with PCP tomorrow, prior A1c 8.1 in March. She does complain of daytime fatigue, can have difficulty sleeping at night and occasional nocturia, denies snoring, witnessed apneas or headaches.  Reports compliance on Keppra , no seizure activity.  No new stroke/TIA symptoms.  Remains on aspirin  and Crestor .  Routinely follows with PCP for stroke risk factor management.  UPDATE (11/12/22, VRP): Since last visit, having right > left foot pain since March 2024. Burning pain in feet. On gabapentin  200mg  at bed time. Also with right foot blue color change and swelling at times.  UPDATE (09/02/22, VRP): Since last visit, doing well until Jan 2024, had a fall and confusion. Found to have ICH, likely chronic small vessel ischemic disease vs amyloid dz. Now stable. Tolerating meds. Still with fatigue, dizziness issues. Using walker intermittently at home. Finishing with PT exercises.  UPDATE (06/13/22, Dr. Jerri): 89 y.o. female with history of seizures, hypertension, hyperlipidemia, diabetes and left MCA stroke presenting after she had an unwitnessed fall with acute onset altered mental status and worsening right-sided weakness.  She does have a history of seizures and may have had a seizure prior to the unwitnessed fall, as she had her normal seizure prodrome  of right-sided pain and sensory deficit before falling.  CT head shows small ICH along left temporal cortex.  UPDATE (02/03/22, VRP): Since last visit, had seizure in October 28, 2021. Now on LEV. Had some punctate subacute strokes on MRI. Intermittent dizziness, chest palpitations.   PRIOR HPI  (10/22/21): 89 year old female with hypertension, hyperlipidemia and diabetes, here for stroke follow up. 03/02/21 had event of driving into garage and being confused. Went to ER and had UTI dx'd. Then 03/06/21, had recurrence of language and confusion changes. Dx'd with left MCA stroke, tx with mech thrombectomy. Stroke workup completed. Then went to rehab, and then back home by Nov 2022. Completed outpatient cardiac monitor (negative). Therapies completed at home.   Now with mild language changes and right vision changes.   REVIEW OF SYSTEMS: Full 14 system review of systems performed and negative with exception of: as per HPI.  ALLERGIES: Allergies  Allergen Reactions   Statins Other (See Comments)    'discomfort, aching everywhere' Tolerates to livalo     Empagliflozin Other (See Comments)   Other     Allergic to diabetic medication but unsure of name. Went to ED February 2023 (ED note I do not see name of med)    Zetia [Ezetimibe] Other (See Comments)    Leg pain   Zocor [Simvastatin] Other (See Comments)    Leg pain    HOME MEDICATIONS: Outpatient Medications Prior to Visit  Medication Sig Dispense Refill   amLODipine  (NORVASC ) 10 MG tablet Take 10 mg by mouth daily.     aspirin  EC 81 MG tablet Take 81 mg by mouth daily. Swallow whole.     cholecalciferol  (VITAMIN D3) 25 MCG (1000 UNIT) tablet Take 1,000 Units by mouth daily.     cyanocobalamin (VITAMIN B12) 1000 MCG tablet Take 1,000 mcg by mouth daily.     glipiZIDE  (GLUCOTROL ) 5 MG tablet Take 5 mg by mouth every morning.     JARDIANCE 10 MG TABS tablet Take 10 mg by mouth daily.     levothyroxine  (SYNTHROID ) 125 MCG tablet Take 1 tablet (125 mcg total) by mouth daily before breakfast. 90 tablet 3   omeprazole  (PRILOSEC) 20 MG capsule Take 20 mg by mouth daily.     pregabalin  (LYRICA ) 50 MG capsule Take 50 mg by mouth 2 (two) times daily.     rosuvastatin  (CRESTOR ) 40 MG tablet Take 40 mg by mouth at bedtime.      Semaglutide,0.25 or 0.5MG /DOS, (OZEMPIC, 0.25 OR 0.5 MG/DOSE,) 2 MG/1.5ML SOPN Inject 0.5 mg into the skin once a week.     amitriptyline  (ELAVIL ) 10 MG tablet Take 1 tablet (10 mg total) by mouth at bedtime. 30 tablet 11   levETIRAcetam  (KEPPRA ) 250 MG tablet Take 1 tablet (250 mg total) by mouth 2 (two) times daily. 180 tablet 4   gabapentin  (NEURONTIN ) 100 MG capsule Take 100 mg by mouth in the morning, at noon, in the evening, and at bedtime. (Patient not taking: Reported on 06/11/2024)     No facility-administered medications prior to visit.    PAST MEDICAL HISTORY: Past Medical History:  Diagnosis Date   Anxiety    Arthritis    Cataract    CVA (cerebral vascular accident) (HCC) 03/06/2021   Dizziness    Dysphagia    'sometimes but not a major issue' been checked out by GI (per pt)   Dysrhythmia    'heart used to skip but doesn't anymore' was checked out by Dr. Micky late '90s,  everything checked out ok and not had any skipping since (all per pt)   Essential hypertension, benign    Fatty liver    GERD (gastroesophageal reflux disease)    H/O hypercalcemia 04/12/2017   Hepatitis B    had at age 77, 'GI doc said it's gone away'   Hepatitis B surface antigen positive    High cholesterol    History of hiatal hernia    Hypercalcemia    Hypothyroidism    Kidney stone 12/2012   Patient was seen at [facility]   Obesity    Osteoporosis    PONV (postoperative nausea and vomiting)    Type 2 diabetes mellitus (HCC)     PAST SURGICAL HISTORY: Past Surgical History:  Procedure Laterality Date   ABDOMINAL HYSTERECTOMY  1981   APPENDECTOMY  1981   BILATERAL CARPAL TUNNEL RELEASE Bilateral    Dr. Addie at surgical center   BIOPSY  05/26/2017   Procedure: BIOPSY;  Surgeon: Golda Claudis PENNER, MD;  [facility];  Service: Endoscopy;;  colon   BIOPSY  10/11/2018   Procedure: BIOPSY;  Surgeon: Golda Claudis PENNER, MD;  [facility]: [facility];  Service: Endoscopy;;   CATARACT  EXTRACTION, BILATERAL     COLONOSCOPY N/A 05/26/2017   Procedure: COLONOSCOPY;  Surgeon: Golda Claudis PENNER, MD;  [facility]: [facility];  Service: Endoscopy;  Laterality: N/A;  10:30   Deviated septum repair     ESOPHAGEAL DILATION N/A 08/16/2019   Procedure: ESOPHAGEAL DILATION;  Surgeon: Golda Claudis PENNER, MD;  [facility];  Service: Endoscopy;  Laterality: N/A;   ESOPHAGOGASTRODUODENOSCOPY N/A 07/28/2016   Procedure: ESOPHAGOGASTRODUODENOSCOPY (EGD);  Surgeon: Claudis PENNER Golda, MD;  [facility];  Service: Endoscopy;  Laterality: N/A;  300 - moved to 3/7 @ 1:00   ESOPHAGOGASTRODUODENOSCOPY N/A 08/16/2019   Procedure: ESOPHAGOGASTRODUODENOSCOPY (EGD);  Surgeon: Golda Claudis PENNER, MD;  [facility];  Service: Endoscopy;  Laterality: N/A;  230   EYE SURGERY     FLEXIBLE SIGMOIDOSCOPY N/A 10/11/2018   Procedure: FLEXIBLE SIGMOIDOSCOPY;  Surgeon: Golda Claudis PENNER, MD;  [facility]: [facility];  Service: Endoscopy;  Laterality: N/A;  unable to complete colonoscopy    IR CT HEAD LTD  03/07/2021   IR PERCUTANEOUS ART THROMBECTOMY/INFUSION INTRACRANIAL INC DIAG ANGIO  03/07/2021   IR US  GUIDE VASC ACCESS RIGHT  03/07/2021   KNEE ARTHROPLASTY Left 09/15/2015   Procedure: COMPUTER ASSISTED TOTAL KNEE ARTHROPLASTY;  Surgeon: Oneil JAYSON Herald, MD;  Location: [facility];  Service: Orthopedics;  Laterality: Left;   KNEE ARTHROSCOPY     left knee 2003   RADIOLOGY WITH ANESTHESIA N/A 03/07/2021   Procedure: IR WITH ANESTHESIA;  Surgeon: Alona Corners, DO;  Location: [facility];  Service: Anesthesiology;  Laterality: N/A;    FAMILY HISTORY: Family History  Problem Relation Age of Onset   COPD Mother    Heart attack Mother    Stroke Father    Hypertension Father    Hip fracture Father    Cancer Maternal Aunt    Diabetes Maternal Aunt    Diabetes Maternal Uncle    Colon cancer Neg Hx     SOCIAL HISTORY: Social History   Socioeconomic History   Marital status: Widowed     Spouse name: Not on file   Number of children: 1   Years of education: Not on file   Highest education level: High school graduate  Occupational History    Comment: retired  Tobacco  Use   Smoking status: Never    Passive exposure: Past   Smokeless tobacco: Never  Vaping Use   Vaping status: Never Used  Substance and Sexual Activity   Alcohol use: No    Alcohol/week: 0.0 standard drinks of alcohol   Drug use: No   Sexual activity: Never  Other Topics Concern   Not on file  Social History Narrative   07/04/19 Lives with son  10/22/21   Caffeine- coffee 1 c daily   Patient is retired   Patient has caregiver that comes in 3times a week.    Social Drivers of Health   Tobacco Use: Low Risk (06/11/2024)   Patient History    Smoking Tobacco Use: Never    Smokeless Tobacco Use: Never    Passive Exposure: Past  Financial Resource Strain: Patient Unable To Answer (07/27/2023)   Received from [facility]   Overall Financial Resource Strain (CARDIA)    Difficulty of Paying Living Expenses: Patient unable to answer  Food Insecurity: Patient Unable To Answer (07/27/2023)   Received from [facility]   Epic    Within the past 12 months, you worried that your food would run out before you got the money to buy more.: Patient unable to answer    Within the past 12 months, the food you bought just didn't last and you didn't have money to get more.: Patient unable to answer  Transportation Needs: Patient Unable To Answer (07/27/2023)   Received from [facility] - Transportation    Lack of Transportation (Non-Medical): Patient unable to answer    Lack of Transportation (Medical): Patient unable to answer  Physical Activity: Not on file  Stress: Not on file  Social Connections: Not on file  Intimate Partner Violence: Not At Risk (06/10/2022)   Humiliation, Afraid, Rape, and Kick questionnaire    Fear of Current or Ex-Partner: No    Emotionally Abused: No    Physically Abused: No     Sexually Abused: No  Depression (PHQ2-9): Not on file  Alcohol Screen: Not on file  Housing: Patient Unable To Answer (07/27/2023)   Received from [facility]   Epic    At any time in the past 12 months, were you homeless or living in a shelter (including now)?: Patient unable to answer    In the past 12 months, how many times have you moved where you were living?: 0    In the last 12 months, was there a time when you were not able to pay the mortgage or rent on time?: Patient unable to answer  Utilities: Patient Unable To Answer (07/27/2023)   Received from [facility] Utilities    Threatened with loss of utilities: Patient unable to answer  Health Literacy: Not on file     PHYSICAL EXAM  GENERAL EXAM/CONSTITUTIONAL: Vitals:  Vitals:   06/11/24 1108  BP: 128/78  Pulse: 97  Weight: 170 lb 3.2 oz (77.2 kg)  Height: 5' 3 (1.6 m)   Patient is in no distress; very pleasant elderly Caucasian female, well developed, nourished and groomed; neck is supple  CARDIOVASCULAR: Examination of carotid arteries is normal; no carotid bruits Regular rate and rhythm, no murmurs Examination of peripheral vascular system by observation and palpation is normal  MUSCULOSKELETAL: Gait, strength, tone, movements noted in Neurologic exam below  NEUROLOGIC: MENTAL STATUS:  awake, alert, oriented to person recent memory mildly impaired and remote memory intact normal attention and concentration SLIGHTLY DECR  FLUENCY; MILD COMPREHENSION DIFF AND APRAXIA fund of knowledge appropriate  CRANIAL NERVE:  2nd, 3rd, 4th, 6th - pupils --> RIGHT 2, LEFT 3; NR; DECR RIGHT VISUAL FIELD; extraocular muscles intact, no nystagmus 5th - facial sensation symmetric 7th - facial strength symmetric 8th - hearing intact 9th - palate elevates symmetrically, uvula midline 11th - shoulder shrug symmetric 12th - tongue protrusion midline  MOTOR:  normal bulk and tone, full strength in the BUE,  BLE  SENSORY:  normal and symmetric to light touch, temperature, vibration; decreased pinprick RLE up to shin and LLE up to ankle; position sensation mildly impaired R>L; intact sensation upper extremities  COORDINATION:  finger-nose-finger, fine finger movements --> SLIGHT DECR COORDINATION IN RUE  REFLEXES:  deep tendon reflexes TRACE and symmetric  GAIT/STATION:  narrow based gait; CAUTIOUS, no use of AD     DIAGNOSTIC DATA (LABS, IMAGING, TESTING) - I reviewed patient records, labs, notes, testing and imaging myself where available.  Lab Results  Component Value Date   WBC 6.7 06/16/2022   HGB 12.0 06/16/2022   HCT 38.1 06/16/2022   MCV 86.2 06/16/2022   PLT 211 06/16/2022      Component Value Date/Time   NA 138 06/16/2022 0655   NA 137 07/16/2019 1002   K 4.9 06/16/2022 0655   CL 106 06/16/2022 0655   CO2 21 (L) 06/16/2022 0655   GLUCOSE 154 (H) 06/16/2022 0655   BUN 42 (H) 06/16/2022 0655   BUN 19 07/16/2019 1002   CREATININE 2.32 (H) 06/16/2022 0655   CALCIUM  8.8 (L) 06/16/2022 0655   PROT 6.0 (L) 06/10/2022 1756   PROT 6.1 07/16/2019 1002   ALBUMIN 3.4 (L) 06/10/2022 1756   ALBUMIN 3.5 (L) 07/16/2019 1002   AST 28 06/10/2022 1756   ALT 16 06/10/2022 1756   ALKPHOS 75 06/10/2022 1756   BILITOT 0.9 06/10/2022 1756   BILITOT 0.2 07/16/2019 1002   GFRNONAA 20 (L) 06/16/2022 0655   GFRAA 38 (L) 07/16/2019 1002   Lab Results  Component Value Date   CHOL 113 10/29/2021   HDL 39 (L) 10/29/2021   LDLCALC 30 10/29/2021   LDLDIRECT 147.9 (H) 03/07/2021   TRIG 218 (H) 10/29/2021   CHOLHDL 2.9 10/29/2021   Lab Results  Component Value Date   HGBA1C 7.2 (H) 10/28/2021   Lab Results  Component Value Date   VITAMINB12 >2000 (H) 01/27/2017   Lab Results  Component Value Date   TSH 0.483 10/29/2021    10/29/21 MRI brain Two small foci of restricted diffusion, with mild ADC correlates, in the left posteroinferior frontal lobe at the anterior aspect of  an area that was involved in the infarcts seen on 03/08/2021 MRI. These are favored to represent additional subacute infarcts.  06/11/22 MRI brain 1. Small focus of hemorrhage in the left anterior temporal lobe is similar to the prior CT, allowing for differences in modality. No significant mass effect. 2. No other acute intracranial pathology.     ASSESSMENT AND PLAN  89 y.o. year old female here with:   Dx:  1. Neuropathy   2. Diabetic polyneuropathy associated with type 2 diabetes mellitus (HCC)   3. Seizure disorder (HCC)   4. Chronic ischemic left MCA stroke      PLAN:  NEUROPATHY - suspect combination of poststroke pain as more lateralized symptoms but also suspect underlying diabetes contributing - Continue amitriptyline  10 mg nightly as this provided significant benefit.  Advised to call with any worsening lower extremity symptoms  or post stroke right sided pain worsens to consider dosage adjustment - Continue pregabalin  50 mg twice daily per PCP - continue OTC creams at home if beneficial  - Hesitant to trial duloxetine  due to kidney function - Discussed importance of diabetic management - recent A1c 6.9, previously 8.1 (07/2023)  - continue B12 supplement and Synthroid , ensure routine monitoring of labs by PCP   SEIZURE DISORDER (post stroke; last events June 2023 and possibly 06/09/22) - continue levetiracetam  250mg  twice a day - refill provided  - advised to cal with any recurrent seizure activity    HX OF STROKE HX OF ICH - Continue aspirin  81 mg daily and Crestor  40 mg daily for secondary stroke prevention manner/prescribed by PCP - Continue close PCP follow-up for aggressive stroke risk factor management       Return in about 9 months (around 03/11/2025).       Harlene Bogaert, AGNP-BC  [facility] [address] [place], [place] [address]  Phone [phone] Fax [phone] Note: This document was prepared  with digital dictation and possible smart phrase technology. Any transcriptional errors that result from this process are unintentional.  "

## 2024-06-11 ENCOUNTER — Encounter: Payer: Self-pay | Admitting: Adult Health

## 2024-06-11 ENCOUNTER — Ambulatory Visit: Admitting: Adult Health

## 2024-06-11 VITALS — BP 128/78 | HR 97 | Ht 63.0 in | Wt 170.2 lb

## 2024-06-11 DIAGNOSIS — Z8673 Personal history of transient ischemic attack (TIA), and cerebral infarction without residual deficits: Secondary | ICD-10-CM

## 2024-06-11 DIAGNOSIS — Z7985 Long-term (current) use of injectable non-insulin antidiabetic drugs: Secondary | ICD-10-CM | POA: Diagnosis not present

## 2024-06-11 DIAGNOSIS — G40909 Epilepsy, unspecified, not intractable, without status epilepticus: Secondary | ICD-10-CM

## 2024-06-11 DIAGNOSIS — G629 Polyneuropathy, unspecified: Secondary | ICD-10-CM

## 2024-06-11 DIAGNOSIS — E1142 Type 2 diabetes mellitus with diabetic polyneuropathy: Secondary | ICD-10-CM

## 2024-06-11 MED ORDER — LEVETIRACETAM 250 MG PO TABS: 250.0000 mg | ORAL_TABLET | Freq: Two times a day (BID) | ORAL | 4 refills | Status: AC

## 2024-06-11 MED ORDER — AMITRIPTYLINE HCL 10 MG PO TABS: 10.0000 mg | ORAL_TABLET | Freq: Every day | ORAL | 3 refills | Status: AC

## 2024-06-11 NOTE — Patient Instructions (Addendum)
 Your Plan:  Continue amitritplyine at current dosage - please let me know if your neuropathy symptoms worsen or if pain over your right thigh worsens and we can consider increasing dosage  Continue keppra  250mg  twice daily for seizure prevention - please call with any seizure activity  Continue close follow up with your PCP for stroke risk factor management     Follow up in 8-9 months or call earlier if needed     Thank you for coming to see us  at [facility]. I hope we have been able to provide you high quality care today.  You may receive a patient satisfaction survey over the next few weeks. We would appreciate your feedback and comments so that we may continue to improve ourselves and the health of our patients.

## 2024-06-18 ENCOUNTER — Ambulatory Visit: Payer: Medicare Other

## 2024-06-29 ENCOUNTER — Telehealth: Payer: Self-pay | Admitting: Adult Health

## 2024-06-29 NOTE — Telephone Encounter (Signed)
 Lisa Sutton(Pt CNA) called from pt's phone along with son(on DPR) has called to report pt has been on the amitriptyline  (ELAVIL ) 10 MG tablet for over a year.  Pt is in more pain, she is asking for a stronger dose, pt pharmacy has not changed.

## 2024-07-23 ENCOUNTER — Ambulatory Visit: Payer: Medicare Other

## 2024-08-27 ENCOUNTER — Ambulatory Visit: Payer: Medicare Other

## 2024-10-01 ENCOUNTER — Ambulatory Visit: Payer: Medicare Other

## 2024-11-05 ENCOUNTER — Ambulatory Visit: Payer: Medicare Other

## 2024-12-10 ENCOUNTER — Ambulatory Visit: Payer: Medicare Other

## 2025-01-14 ENCOUNTER — Ambulatory Visit: Payer: Medicare Other

## 2025-02-18 ENCOUNTER — Ambulatory Visit: Payer: Medicare Other

## 2025-03-13 ENCOUNTER — Ambulatory Visit: Admitting: Adult Health
